# Patient Record
Sex: Female | Born: 1965 | Race: White | Hispanic: No | Marital: Married | Smoking: Never smoker
Health system: Southern US, Community
[De-identification: ages and names within clinical notes are randomized; demographics above are authoritative.]

## PROBLEM LIST (undated history)

## (undated) DIAGNOSIS — F53 Postpartum depression: Secondary | ICD-10-CM

## (undated) DIAGNOSIS — N63 Unspecified lump in unspecified breast: Secondary | ICD-10-CM

## (undated) DIAGNOSIS — Q632 Ectopic kidney: Secondary | ICD-10-CM

## (undated) DIAGNOSIS — E7800 Pure hypercholesterolemia, unspecified: Secondary | ICD-10-CM

## (undated) DIAGNOSIS — N879 Dysplasia of cervix uteri, unspecified: Secondary | ICD-10-CM

## (undated) DIAGNOSIS — I10 Essential (primary) hypertension: Secondary | ICD-10-CM

## (undated) DIAGNOSIS — O99345 Other mental disorders complicating the puerperium: Secondary | ICD-10-CM

## (undated) DIAGNOSIS — F419 Anxiety disorder, unspecified: Secondary | ICD-10-CM

## (undated) DIAGNOSIS — K219 Gastro-esophageal reflux disease without esophagitis: Secondary | ICD-10-CM

## (undated) DIAGNOSIS — I4891 Unspecified atrial fibrillation: Secondary | ICD-10-CM

## (undated) DIAGNOSIS — F319 Bipolar disorder, unspecified: Secondary | ICD-10-CM

## (undated) DIAGNOSIS — C569 Malignant neoplasm of unspecified ovary: Secondary | ICD-10-CM

## (undated) DIAGNOSIS — E119 Type 2 diabetes mellitus without complications: Secondary | ICD-10-CM

## (undated) HISTORY — DX: Other mental disorders complicating the puerperium: O99.345

## (undated) HISTORY — PX: GYNECOLOGIC CRYOSURGERY: SHX857

## (undated) HISTORY — PX: COLPOSCOPY: SHX161

## (undated) HISTORY — PX: TOTAL ABDOMINAL HYSTERECTOMY: SHX209

## (undated) HISTORY — DX: Postpartum depression: F53.0

## (undated) HISTORY — PX: ABDOMINAL HYSTERECTOMY: SHX81

## (undated) HISTORY — PX: BREAST SURGERY: SHX581

## (undated) HISTORY — DX: Bipolar disorder, unspecified: F31.9

## (undated) HISTORY — DX: Dysplasia of cervix uteri, unspecified: N87.9

## (undated) HISTORY — DX: Malignant neoplasm of unspecified ovary: C56.9

## (undated) HISTORY — DX: Ectopic kidney: Q63.2

---

## 1989-02-15 HISTORY — PX: PELVIC LAPAROSCOPY: SHX162

## 1999-11-20 ENCOUNTER — Encounter: Payer: Self-pay | Admitting: Emergency Medicine

## 1999-11-20 ENCOUNTER — Emergency Department (HOSPITAL_COMMUNITY): Admission: EM | Admit: 1999-11-20 | Discharge: 1999-11-21 | Payer: Self-pay | Admitting: Emergency Medicine

## 2000-06-01 ENCOUNTER — Encounter: Payer: Self-pay | Admitting: General Practice

## 2000-06-01 ENCOUNTER — Encounter: Admission: RE | Admit: 2000-06-01 | Discharge: 2000-06-01 | Payer: Self-pay | Admitting: General Practice

## 2000-07-21 ENCOUNTER — Encounter: Payer: Self-pay | Admitting: Gastroenterology

## 2000-07-21 ENCOUNTER — Encounter: Admission: RE | Admit: 2000-07-21 | Discharge: 2000-07-21 | Payer: Self-pay | Admitting: Gastroenterology

## 2000-08-30 ENCOUNTER — Other Ambulatory Visit: Admission: RE | Admit: 2000-08-30 | Discharge: 2000-08-30 | Payer: Self-pay | Admitting: Obstetrics and Gynecology

## 2001-04-11 ENCOUNTER — Ambulatory Visit (HOSPITAL_BASED_OUTPATIENT_CLINIC_OR_DEPARTMENT_OTHER): Admission: RE | Admit: 2001-04-11 | Discharge: 2001-04-11 | Payer: Self-pay | Admitting: Pulmonary Disease

## 2001-06-21 ENCOUNTER — Other Ambulatory Visit: Admission: RE | Admit: 2001-06-21 | Discharge: 2001-06-21 | Payer: Self-pay | Admitting: *Deleted

## 2001-10-12 ENCOUNTER — Encounter: Admission: RE | Admit: 2001-10-12 | Discharge: 2001-10-12 | Payer: Self-pay | Admitting: Gynecology

## 2002-01-02 ENCOUNTER — Inpatient Hospital Stay (HOSPITAL_COMMUNITY): Admission: AD | Admit: 2002-01-02 | Discharge: 2002-01-06 | Payer: Self-pay | Admitting: *Deleted

## 2002-01-04 ENCOUNTER — Encounter (INDEPENDENT_AMBULATORY_CARE_PROVIDER_SITE_OTHER): Payer: Self-pay

## 2002-02-20 ENCOUNTER — Other Ambulatory Visit: Admission: RE | Admit: 2002-02-20 | Discharge: 2002-02-20 | Payer: Self-pay | Admitting: *Deleted

## 2002-02-26 ENCOUNTER — Encounter: Admission: RE | Admit: 2002-02-26 | Discharge: 2002-02-26 | Payer: Self-pay | Admitting: Psychiatry

## 2003-02-25 ENCOUNTER — Other Ambulatory Visit: Admission: RE | Admit: 2003-02-25 | Discharge: 2003-02-25 | Payer: Self-pay | Admitting: Obstetrics and Gynecology

## 2004-03-06 ENCOUNTER — Other Ambulatory Visit: Admission: RE | Admit: 2004-03-06 | Discharge: 2004-03-06 | Payer: Self-pay | Admitting: Obstetrics and Gynecology

## 2004-03-16 ENCOUNTER — Encounter: Admission: RE | Admit: 2004-03-16 | Discharge: 2004-03-16 | Payer: Self-pay | Admitting: Obstetrics and Gynecology

## 2006-04-25 ENCOUNTER — Other Ambulatory Visit: Admission: RE | Admit: 2006-04-25 | Discharge: 2006-04-25 | Payer: Self-pay | Admitting: Obstetrics and Gynecology

## 2006-10-27 ENCOUNTER — Encounter: Admission: RE | Admit: 2006-10-27 | Discharge: 2006-10-27 | Payer: Self-pay | Admitting: Obstetrics and Gynecology

## 2006-11-28 ENCOUNTER — Encounter: Admission: RE | Admit: 2006-11-28 | Discharge: 2006-11-28 | Payer: Self-pay | Admitting: Family Medicine

## 2007-02-26 ENCOUNTER — Emergency Department (HOSPITAL_COMMUNITY): Admission: EM | Admit: 2007-02-26 | Discharge: 2007-02-26 | Payer: Self-pay | Admitting: Emergency Medicine

## 2007-05-18 ENCOUNTER — Other Ambulatory Visit: Admission: RE | Admit: 2007-05-18 | Discharge: 2007-05-18 | Payer: Self-pay | Admitting: Obstetrics and Gynecology

## 2007-09-06 ENCOUNTER — Encounter: Admission: RE | Admit: 2007-09-06 | Discharge: 2007-09-06 | Payer: Self-pay | Admitting: Family Medicine

## 2007-10-30 ENCOUNTER — Encounter: Admission: RE | Admit: 2007-10-30 | Discharge: 2007-10-30 | Payer: Self-pay | Admitting: Neurology

## 2007-12-25 ENCOUNTER — Encounter: Admission: RE | Admit: 2007-12-25 | Discharge: 2007-12-25 | Payer: Self-pay | Admitting: Neurology

## 2008-01-15 ENCOUNTER — Encounter: Admission: RE | Admit: 2008-01-15 | Discharge: 2008-01-15 | Payer: Self-pay | Admitting: Neurology

## 2008-07-02 ENCOUNTER — Encounter: Admission: RE | Admit: 2008-07-02 | Discharge: 2008-07-02 | Payer: Self-pay | Admitting: Obstetrics and Gynecology

## 2008-09-23 ENCOUNTER — Encounter: Payer: Self-pay | Admitting: Obstetrics and Gynecology

## 2008-09-23 ENCOUNTER — Other Ambulatory Visit: Admission: RE | Admit: 2008-09-23 | Discharge: 2008-09-23 | Payer: Self-pay | Admitting: Obstetrics and Gynecology

## 2008-09-23 ENCOUNTER — Ambulatory Visit: Payer: Self-pay | Admitting: Obstetrics and Gynecology

## 2009-11-13 ENCOUNTER — Encounter: Admission: RE | Admit: 2009-11-13 | Discharge: 2009-11-13 | Payer: Self-pay | Admitting: Orthopedic Surgery

## 2009-12-26 ENCOUNTER — Encounter: Admission: RE | Admit: 2009-12-26 | Discharge: 2009-12-26 | Payer: Self-pay | Admitting: Orthopedic Surgery

## 2010-02-15 HISTORY — PX: NECK SURGERY: SHX720

## 2010-03-08 ENCOUNTER — Encounter: Payer: Self-pay | Admitting: Obstetrics and Gynecology

## 2010-03-09 ENCOUNTER — Encounter: Payer: Self-pay | Admitting: Neurology

## 2010-05-23 ENCOUNTER — Emergency Department (HOSPITAL_COMMUNITY)
Admission: EM | Admit: 2010-05-23 | Discharge: 2010-05-24 | Disposition: A | Discharge status: Home / Self Care | Payer: Federal, State, Local not specified - PPO | Attending: Emergency Medicine | Admitting: Emergency Medicine

## 2010-05-23 DIAGNOSIS — F319 Bipolar disorder, unspecified: Secondary | ICD-10-CM | POA: Insufficient documentation

## 2010-05-23 DIAGNOSIS — H53149 Visual discomfort, unspecified: Secondary | ICD-10-CM | POA: Insufficient documentation

## 2010-05-23 DIAGNOSIS — R42 Dizziness and giddiness: Secondary | ICD-10-CM | POA: Insufficient documentation

## 2010-05-23 DIAGNOSIS — R51 Headache: Secondary | ICD-10-CM | POA: Insufficient documentation

## 2010-05-23 DIAGNOSIS — R110 Nausea: Secondary | ICD-10-CM | POA: Insufficient documentation

## 2010-05-23 DIAGNOSIS — K219 Gastro-esophageal reflux disease without esophagitis: Secondary | ICD-10-CM | POA: Insufficient documentation

## 2010-05-23 DIAGNOSIS — R209 Unspecified disturbances of skin sensation: Secondary | ICD-10-CM | POA: Insufficient documentation

## 2010-05-24 ENCOUNTER — Emergency Department (HOSPITAL_COMMUNITY): Payer: Federal, State, Local not specified - PPO

## 2010-06-24 ENCOUNTER — Other Ambulatory Visit: Payer: Self-pay | Admitting: Obstetrics and Gynecology

## 2010-06-24 DIAGNOSIS — Z1231 Encounter for screening mammogram for malignant neoplasm of breast: Secondary | ICD-10-CM

## 2010-06-28 ENCOUNTER — Emergency Department (HOSPITAL_COMMUNITY)
Admission: EM | Admit: 2010-06-28 | Discharge: 2010-06-29 | Disposition: A | Discharge status: Home / Self Care | Payer: Federal, State, Local not specified - PPO | Attending: Emergency Medicine | Admitting: Emergency Medicine

## 2010-06-28 DIAGNOSIS — H53149 Visual discomfort, unspecified: Secondary | ICD-10-CM | POA: Insufficient documentation

## 2010-06-28 DIAGNOSIS — Z79899 Other long term (current) drug therapy: Secondary | ICD-10-CM | POA: Insufficient documentation

## 2010-06-28 DIAGNOSIS — F319 Bipolar disorder, unspecified: Secondary | ICD-10-CM | POA: Insufficient documentation

## 2010-06-28 DIAGNOSIS — R51 Headache: Secondary | ICD-10-CM | POA: Insufficient documentation

## 2010-06-28 DIAGNOSIS — R110 Nausea: Secondary | ICD-10-CM | POA: Insufficient documentation

## 2010-06-30 ENCOUNTER — Ambulatory Visit
Admission: RE | Admit: 2010-06-30 | Discharge: 2010-06-30 | Disposition: A | Discharge status: Home / Self Care | Payer: Federal, State, Local not specified - PPO | Source: Ambulatory Visit | Attending: Obstetrics and Gynecology | Admitting: Obstetrics and Gynecology

## 2010-06-30 DIAGNOSIS — Z1231 Encounter for screening mammogram for malignant neoplasm of breast: Secondary | ICD-10-CM

## 2010-07-01 ENCOUNTER — Other Ambulatory Visit: Payer: Self-pay | Admitting: Obstetrics and Gynecology

## 2010-07-01 DIAGNOSIS — R928 Other abnormal and inconclusive findings on diagnostic imaging of breast: Secondary | ICD-10-CM

## 2010-07-03 NOTE — Discharge Summary (Signed)
   NAME:  Morgan [name], Morgan [name]                        ACCOUNT NO.:  [id number]   MEDICAL RECORD NO.:  [id number]                   PATIENT TYPE:  INP   LOCATION:  9130                                 FACILITY:  [facility]   PHYSICIAN:  Ivor Costa. Farrel Gobble, M.D.              DATE OF BIRTH:  Mar 07, 1965   DATE OF ADMISSION:  01/02/2002  DATE OF DISCHARGE:  01/06/2002                                 DISCHARGE SUMMARY   DISCHARGE DIAGNOSES:  1. Intrauterine pregnancy at 39 weeks.  2. Pregnancy-induced hypertension.  3. Arrest of dilatation.  4. Fetal tachycardia.  5. Occipitoposterior position.   PROCEDURE:  Primary low cervical transverse cesarean section with delivery  of a viable infant.   HISTORY OF PRESENT ILLNESS:  The patient is a 45 year old gravida 1 with an  EDC of January 12, 2002.  Prenatal course was complicated by pelvic left  kidney.   LABORATORY DATA:  Blood type A positive, antibody screen negative, RPR  _____________, HIV nonreactive. GBS was positive.   HOSPITAL COURSE OF TREATMENT:  The patient was admitted on January 03, 2002  for induction of labor secondary to [facility], secondary to arrest of dilatation,  fetal tachycardia, occipitoposterior position.  Delivery was accomplished by  primary low cervical transverse cesarean section.  Performed by Dr.  Penni Homans under epidural anesthesia.  Findings included a viable 6 pound 9  ounce female infant, Apgars 8 and 9; normal appearing uterus, tubes and  ovaries.   Postpartum, the patient remained afebrile, had no difficulty voiding, was  ready to be discharged on her third postoperative day in satisfactory  condition.  CBC:  Hematocrit 20.9, hemoglobin 7.2, WBC 11.7, platelets 161.   DISPOSITION:  Follow up in six weeks.   DISCHARGE MEDICATIONS:  Continue prenatal vitamins and iron, Motrin and  Tylox for pain.     Elwyn Lade . Hancock, N.P.                Ivor Costa. Farrel Gobble, M.D.    MKH/MEDQ  D:  02/05/2002  T:  02/05/2002  Job:   [id number]

## 2010-07-03 NOTE — Op Note (Signed)
NAME:  Morgan [name], Morgan [name]                        ACCOUNT NO.:  [id number]   MEDICAL RECORD NO.:  [id number]                   PATIENT TYPE:  INP   LOCATION:  9130                                 FACILITY:  [facility]   PHYSICIAN:  Katy Fitch, M.D.               DATE OF BIRTH:  1965/07/11   DATE OF PROCEDURE:  01/03/2002  DATE OF DISCHARGE:                                 OPERATIVE REPORT   PREOPERATIVE DIAGNOSES:  1. 39 week intrauterine pregnancy.  2. Pregnancy induced hypertension.  3. Arrested dilatation.  4. Fetal tachycardia.  5. Occiput posterior position.   POSTOPERATIVE DIAGNOSES:  1. 39 week intrauterine pregnancy.  2. Pregnancy induced hypertension.  3. Arrested dilatation.  4. Fetal tachycardia.  5. Occiput position.   PROCEDURE:  Primary low transverse cesarean section.   SURGEON:  Katy Fitch, M.D.   ANESTHESIA:  Epidural.   ESTIMATED BLOOD LOSS:  800   URINE OUTPUT:  100   FLUIDS:  2 liters.   COMPLICATIONS:  None.   SPECIMENS:  1. Cord pH.  2. Cord blood.  3. Placenta.   FINDINGS:  Living 6 pound 9 ounce female with Apgar of 8&9 with three vessel  cord in OP position. Cord pH noted to be 7.29, normal appearing uterus,  tubes and ovaries. After the patient was arrested at 8 cm and noted to be in  OP position for over two hours, the patient was offered a cesarean section  and taken back to the operating room after risks were accepted by the  husband and patient.   DESCRIPTION OF PROCEDURE:  The patient was prepped and draped in normal  sterile fashion after epidural anesthesia was bolused. The Pfannenstiel  incision was made in the skin and carried down to the underlying fascia. The  fascia was incised and extended transversely. Using curved Mayo scissors,  the inferior fascial border was grasped with Kocher clamps and the rectus  muscles were dissected off that bluntly and sharply with the curved Mayo  scissors. This was done similarly with the  superior fascial border. The  rectus muscles were separated in the midline manually. The underlying  peritoneum was then entered sharply with a hemostat. The peritoneum was  extended superiorly and then inferiorly to the level of the bladder. A  bladder blade was placed in the pelvis and vesicouterine peritoneum  identified and incised with Metzenbaums and extended laterally. A bladder  flap was then created digitally replacing a newly created bladder flap. A  low transverse uterine incision was made with a scalpel and extended  transversely with bandage scissors. The infants head was noted to be in an  occiput posterior position and the infant's head was then delivered  atraumatically and then the rest of the body. The mouth and nose were  suctioned, the cord was clamped and cut and the infant handed off to waiting  NICU attendants. Cord pH was then  obtained and then cord blood and then the  placenta was massaged from the uterus. The uterus was then exteriorized and  cleared of all clots and debris. A running 1-0 Chromic interlocking stitch  was used to close the lower uterine segment. The incision was then tacked  and the posterior cul-de-sac was irrigated with warm normal saline. There  were several areas noted bleeding in the incision and a second layer of 1-0  Chromic interlocking stitches were placed to embrocate the first uterine  incision. This was noted to be hemostatic. The pelvis was then irrigated  with warm normal saline. The rectus muscles were then reapproximated with  two interrupted 1-0 Chromic sutures with the peritoneum also included. The  subfascial area was inspected for bleeding and noted to be hemostatic. The  fascia was closed using a running #0 Vicryl stitch. The subcutaneous tissue  was irrigated with warm normal saline and two areas of Bovie cautery to  obtain hemostasis. The skin was closed using staples. The patient was taken  to the recovery room in stable  condition.                                                Katy Fitch, M.D.    DC/MEDQ  D:  01/03/2002  T:  01/03/2002  Job:  [id number]

## 2010-07-09 ENCOUNTER — Ambulatory Visit
Admission: RE | Admit: 2010-07-09 | Discharge: 2010-07-09 | Disposition: A | Discharge status: Home / Self Care | Payer: Federal, State, Local not specified - PPO | Source: Ambulatory Visit | Attending: Obstetrics and Gynecology | Admitting: Obstetrics and Gynecology

## 2010-07-09 ENCOUNTER — Other Ambulatory Visit: Payer: Federal, State, Local not specified - PPO

## 2010-07-09 DIAGNOSIS — R928 Other abnormal and inconclusive findings on diagnostic imaging of breast: Secondary | ICD-10-CM

## 2010-07-10 ENCOUNTER — Encounter (INDEPENDENT_AMBULATORY_CARE_PROVIDER_SITE_OTHER): Payer: Federal, State, Local not specified - PPO | Admitting: Obstetrics and Gynecology

## 2010-07-10 ENCOUNTER — Other Ambulatory Visit: Payer: Self-pay | Admitting: Obstetrics and Gynecology

## 2010-07-10 ENCOUNTER — Other Ambulatory Visit (HOSPITAL_COMMUNITY)
Admission: RE | Admit: 2010-07-10 | Discharge: 2010-07-10 | Disposition: A | Discharge status: Home / Self Care | Payer: Federal, State, Local not specified - PPO | Source: Ambulatory Visit | Attending: Obstetrics and Gynecology | Admitting: Obstetrics and Gynecology

## 2010-07-10 DIAGNOSIS — Z124 Encounter for screening for malignant neoplasm of cervix: Secondary | ICD-10-CM | POA: Insufficient documentation

## 2010-07-10 DIAGNOSIS — R809 Proteinuria, unspecified: Secondary | ICD-10-CM

## 2010-07-10 DIAGNOSIS — Z13220 Encounter for screening for lipoid disorders: Secondary | ICD-10-CM

## 2010-07-10 DIAGNOSIS — Z833 Family history of diabetes mellitus: Secondary | ICD-10-CM

## 2010-07-10 DIAGNOSIS — Z01419 Encounter for gynecological examination (general) (routine) without abnormal findings: Secondary | ICD-10-CM

## 2010-07-10 DIAGNOSIS — N949 Unspecified condition associated with female genital organs and menstrual cycle: Secondary | ICD-10-CM

## 2010-11-05 LAB — DIFFERENTIAL
Basophils Absolute: 0
Basophils Relative: 0
Eosinophils Absolute: 0.6
Eosinophils Relative: 10 — ABNORMAL HIGH
Lymphocytes Relative: 24
Lymphs Abs: 1.5
Monocytes Absolute: 0.2
Monocytes Relative: 4
Neutro Abs: 3.9
Neutrophils Relative %: 62

## 2010-11-05 LAB — POCT CARDIAC MARKERS
CKMB, poc: <1 — ABNORMAL LOW
Myoglobin, poc: 55
Operator id: 284141
Troponin i, poc: <0.05

## 2010-11-05 LAB — I-STAT 8, (EC8 V) (CONVERTED LAB)
BUN: 8
Bicarbonate: 24.6 — ABNORMAL HIGH
Chloride: 109
Glucose, Bld: 97
HCT: 42
Hemoglobin: 14.3
Operator id: 284141
Potassium: 4.1
Sodium: 139
TCO2: 26
pCO2, Ven: 40 — ABNORMAL LOW
pH, Ven: 7.396 — ABNORMAL HIGH

## 2010-11-05 LAB — POCT I-STAT CREATININE
Creatinine, Ser: 0.9
Operator id: 284141

## 2010-11-05 LAB — CBC
HCT: 41.2
Hemoglobin: 14.1
MCHC: 34.1
MCV: 88.1
Platelets: 253
RBC: 4.68
RDW: 13.2
WBC: 6.2

## 2010-11-05 LAB — D-DIMER, QUANTITATIVE (NOT AT ARMC): D-Dimer, Quant: 0.22

## 2011-04-02 ENCOUNTER — Encounter: Payer: Self-pay | Admitting: *Deleted

## 2011-04-02 ENCOUNTER — Telehealth: Payer: Self-pay | Admitting: *Deleted

## 2011-04-02 ENCOUNTER — Ambulatory Visit (INDEPENDENT_AMBULATORY_CARE_PROVIDER_SITE_OTHER): Payer: Federal, State, Local not specified - PPO | Admitting: Obstetrics and Gynecology

## 2011-04-02 DIAGNOSIS — N939 Abnormal uterine and vaginal bleeding, unspecified: Secondary | ICD-10-CM

## 2011-04-02 DIAGNOSIS — N938 Other specified abnormal uterine and vaginal bleeding: Secondary | ICD-10-CM

## 2011-04-02 DIAGNOSIS — F319 Bipolar disorder, unspecified: Secondary | ICD-10-CM | POA: Insufficient documentation

## 2011-04-02 DIAGNOSIS — N926 Irregular menstruation, unspecified: Secondary | ICD-10-CM

## 2011-04-02 LAB — FOLLICLE STIMULATING HORMONE: FSH: 15.5 m[IU]/mL

## 2011-04-02 LAB — TSH: TSH: 3.728 u[IU]/mL (ref 0.350–4.500)

## 2011-04-02 MED ORDER — MEGESTROL ACETATE 40 MG PO TABS: 40.0000 mg | ORAL_TABLET | Freq: Two times a day (BID) | ORAL | Status: AC

## 2011-04-02 NOTE — Progress Notes (Signed)
The patient had gone 5 months without a period. She then started to bleed last night. It is now heavy with clots and cramping. She contraceptives by vasectomy.  Pelvic exam: External within normal limits. BUS within normal limits. Vaginal exam within normal limits. Cervix is clean without lesions. Uterus is normal size and shape. Adnexa failed to reveal masses. Rectovaginal examination is confirmatory and without masses. Kennon Portela present.  Assessment: DUB  Plan: Endometrial biopsy done. TSH and FSH drawn. Patient started on Megace 40 mg twice a day for 20 days. Discussed having her call every 10 weeks that she doesn't bleed for Provera withdrawal. Ibuprofen until bleeding stops.

## 2011-04-02 NOTE — Telephone Encounter (Signed)
We were brought a note by Dr Garen Lah office this morning at 820am to call the patient. She had called the dr on call and was told we would call to make her an appointment.Dr Hyacinth Meeker advised she needed to make an appt. I called the patient. She states no period in 5 months, she has been skipping a lot lately. This month started with pain 2 days ago and then a heavy period as of today. Pt was made an appt for today

## 2011-04-05 ENCOUNTER — Ambulatory Visit: Payer: Federal, State, Local not specified - PPO | Admitting: Obstetrics and Gynecology

## 2011-04-05 ENCOUNTER — Other Ambulatory Visit: Payer: Self-pay | Admitting: Obstetrics and Gynecology

## 2011-04-05 ENCOUNTER — Other Ambulatory Visit: Payer: Federal, State, Local not specified - PPO

## 2011-04-05 ENCOUNTER — Ambulatory Visit (INDEPENDENT_AMBULATORY_CARE_PROVIDER_SITE_OTHER): Payer: Federal, State, Local not specified - PPO | Admitting: Obstetrics and Gynecology

## 2011-04-05 ENCOUNTER — Ambulatory Visit (INDEPENDENT_AMBULATORY_CARE_PROVIDER_SITE_OTHER): Payer: Federal, State, Local not specified - PPO

## 2011-04-05 ENCOUNTER — Telehealth: Payer: Self-pay | Admitting: *Deleted

## 2011-04-05 DIAGNOSIS — Q632 Ectopic kidney: Secondary | ICD-10-CM

## 2011-04-05 DIAGNOSIS — R102 Pelvic and perineal pain: Secondary | ICD-10-CM

## 2011-04-05 DIAGNOSIS — N938 Other specified abnormal uterine and vaginal bleeding: Secondary | ICD-10-CM

## 2011-04-05 DIAGNOSIS — N921 Excessive and frequent menstruation with irregular cycle: Secondary | ICD-10-CM

## 2011-04-05 DIAGNOSIS — R1032 Left lower quadrant pain: Secondary | ICD-10-CM

## 2011-04-05 DIAGNOSIS — Q638 Other specified congenital malformations of kidney: Secondary | ICD-10-CM

## 2011-04-05 DIAGNOSIS — N939 Abnormal uterine and vaginal bleeding, unspecified: Secondary | ICD-10-CM

## 2011-04-05 DIAGNOSIS — N949 Unspecified condition associated with female genital organs and menstrual cycle: Secondary | ICD-10-CM

## 2011-04-05 DIAGNOSIS — N926 Irregular menstruation, unspecified: Secondary | ICD-10-CM

## 2011-04-05 MED ORDER — HYDROCODONE-ACETAMINOPHEN 5-500 MG PO TABS: 2.0000 | ORAL_TABLET | Freq: Four times a day (QID) | ORAL | Status: DC | PRN

## 2011-04-05 NOTE — Progress Notes (Signed)
Patient came to see me today because her bleeding restarted after 2 days of Megace and addition she's having left lower quadrant pain. She is having no GI symptoms at all. Her endometrial biopsy was benign. Her lab work was normal.  Exam: Kim gardner  Present. Abdomen: Soft without peritoneal signs. Some tenderness in left lower quadrant. Pelvic ultrasound: Patient has a anteverted uterus with a prominent endometrial cavity. Her endometrial echo is 8.9 mm. There is positive PFD within the cavity. Her right ovary is normal. The left ovary appears to be normal. She does have a left pelvic kidney. Her cul-de-sac is free of fluid.  Assessment: DUB. Possible endometrial polyp.  Plan: Increase Megace 40 mg 4 times a day. Reduced to 3 times a day when no bleeding for 2 days. Continue medication for 20 days.Warned about withdrawal bleeding.  We will watch and if bleeding reoccurs consider possibility of endometrial polyp. Call every 10 weeks that she does not have a cycle of Provera withdrawal. Vicodin 5/ 500 prescription written.

## 2011-04-05 NOTE — Telephone Encounter (Signed)
I have just sent you a note on her. Please read it and call her with results. Due to the sharp pain she should schedule ultrasound here today.

## 2011-04-05 NOTE — Telephone Encounter (Signed)
Pt informed with the below note, transferred to make appointment for ultrasound, order placed, pt also informed of 2/15 result note.

## 2011-04-05 NOTE — Telephone Encounter (Signed)
Pt was seen on Friday for amenorrhea x 5 months and then sudden bleeding, pt was given megace 40 mg, bleeding has stopped. Pt is  c/o sharp pelvic pain on left side she is taking Ibuprofen 800 mg but no relief. Pt said no cramping just sharp pain. Please advise

## 2011-05-05 ENCOUNTER — Telehealth: Payer: Self-pay | Admitting: *Deleted

## 2011-05-05 NOTE — Telephone Encounter (Signed)
Pt called c/o no period yet, per Dr.G note pt is to call if no period in 10 weeks from taking megace. Pt okay with this and will follow up.

## 2011-08-12 ENCOUNTER — Telehealth: Payer: Self-pay | Admitting: *Deleted

## 2011-08-12 MED ORDER — MEDROXYPROGESTERONE ACETATE 10 MG PO TABS: 10.0000 mg | ORAL_TABLET | Freq: Every day | ORAL | Status: DC

## 2011-08-12 NOTE — Telephone Encounter (Signed)
Pt has same partner, no chance of pregnancy, Rx called in, pt will follow up as needed.

## 2011-08-12 NOTE — Addendum Note (Signed)
Addended by: Aura Camps on: 08/12/2011 12:42 PM   Modules accepted: Orders

## 2011-08-12 NOTE — Telephone Encounter (Signed)
I assume she is still using vasectomy for contraception. If partner has changed let me know. If same partner Provera 10 mg daily for 5 days. If no cycle within 14 days of finishing let me know.

## 2011-08-12 NOTE — Telephone Encounter (Signed)
Pt was told to follow up if no cycle in 10 weeks pt said last cycle was 1st week of April. Pt is calling to get rx to start cycle. Please advise

## 2011-08-31 ENCOUNTER — Encounter: Payer: Self-pay | Admitting: *Deleted

## 2011-08-31 NOTE — Progress Notes (Signed)
Patient ID: Morgan [name], female   DOB: 1965/05/13, 46 y.o.   MRN: [id number] Pt was given provera x 5 days to take to start cycle no cycle yet, pt has had some cramping, told pt to wait until Thursday no cycle call back and will send to doctor, pt okay with this as well.

## 2011-09-02 ENCOUNTER — Telehealth: Payer: Self-pay | Admitting: *Deleted

## 2011-09-02 NOTE — Telephone Encounter (Signed)
(  Pt of DR.G) pt was given provera 10 mg daily x 5 days ( 08/12/11)  to start cycle, pt took all rx had some cramping on Tuesday no cycle yet. Pt was told to call back if no cycle in 14 days after taking medication. Please advise

## 2011-09-02 NOTE — Telephone Encounter (Signed)
I would recommend office visit. Not emergency can wait to Dr. Eda Paschal returns or she can see one of [place] sooner if she chooses

## 2011-09-02 NOTE — Telephone Encounter (Signed)
Pt informed with the below note. She will wait until Dr.G returns

## 2011-10-11 ENCOUNTER — Encounter: Payer: Self-pay | Admitting: Obstetrics and Gynecology

## 2011-10-11 ENCOUNTER — Ambulatory Visit (INDEPENDENT_AMBULATORY_CARE_PROVIDER_SITE_OTHER): Payer: Federal, State, Local not specified - PPO | Admitting: Obstetrics and Gynecology

## 2011-10-11 VITALS — BP 126/84 | Ht 62.0 in | Wt 156.0 lb

## 2011-10-11 DIAGNOSIS — Z833 Family history of diabetes mellitus: Secondary | ICD-10-CM

## 2011-10-11 DIAGNOSIS — Z01419 Encounter for gynecological examination (general) (routine) without abnormal findings: Secondary | ICD-10-CM

## 2011-10-11 DIAGNOSIS — N912 Amenorrhea, unspecified: Secondary | ICD-10-CM

## 2011-10-11 DIAGNOSIS — N879 Dysplasia of cervix uteri, unspecified: Secondary | ICD-10-CM | POA: Insufficient documentation

## 2011-10-11 LAB — CBC WITH DIFFERENTIAL/PLATELET
Basophils Absolute: 0 10*3/uL (ref 0.0–0.1)
Basophils Relative: 1 % (ref 0–1)
Eosinophils Absolute: 0.4 10*3/uL (ref 0.0–0.7)
Eosinophils Relative: 5 % (ref 0–5)
HCT: 36.5 % (ref 36.0–46.0)
Hemoglobin: 12.3 g/dL (ref 12.0–15.0)
Lymphocytes Relative: 26 % (ref 12–46)
Lymphs Abs: 1.8 10*3/uL (ref 0.7–4.0)
MCH: 27.2 pg (ref 26.0–34.0)
MCHC: 33.7 g/dL (ref 30.0–36.0)
MCV: 80.6 fL (ref 78.0–100.0)
Monocytes Absolute: 0.3 10*3/uL (ref 0.1–1.0)
Monocytes Relative: 5 % (ref 3–12)
Neutro Abs: 4.3 10*3/uL (ref 1.7–7.7)
Neutrophils Relative %: 63 % (ref 43–77)
Platelets: 216 10*3/uL (ref 150–400)
RBC: 4.53 MIL/uL (ref 3.87–5.11)
RDW: 14.5 % (ref 11.5–15.5)
WBC: 6.8 10*3/uL (ref 4.0–10.5)

## 2011-10-11 LAB — LIPID PANEL
Cholesterol: 192 mg/dL (ref 0–200)
HDL: 35 mg/dL — ABNORMAL LOW (ref >39)
LDL Cholesterol: 111 mg/dL — ABNORMAL HIGH (ref 0–99)
Total CHOL/HDL Ratio: 5.5 Ratio
Triglycerides: 229 mg/dL — ABNORMAL HIGH (ref <150)
VLDL: 46 mg/dL — ABNORMAL HIGH (ref 0–40)

## 2011-10-11 NOTE — Progress Notes (Signed)
Patient came to see me today for her annual GYN exam. We have seen her in February of this year for dysfunctional uterine bleeding and assessment was negative. After she was treated with Megace she had one more cycle and now has had secondary amenorrhea. She is not sexually active. Her partner has had a vasectomy.  She is not having menopausal symptoms. In February her [facility] was normal. In June she called for  Provera withdrawal and took Provera for 5 days. She had no period after she finished but did have some cramping. She has previously been treated for cervical dysplasia with cryosurgery but that has been greater than 20 years ago. She has had normal yearly Pap smears since then. She had a normal Pap smear in 2012. She is due for yearly mammogram. Her mother had breast cancer at 68. She is not sure if she was screened for BRCA1 and BRCA2. She has a history of endometriosis with a laparoscopy in 1991. Last year her lipid profile showed a total cholesterol 205 with an LDL of 111 and an HDL of 36. She did however have elevated triglycerides of 285. The patient is not fasted but has had very little to eat today  Physical examination:Morgan [name] present. HEENT within normal limits. Neck: Thyroid not large. No masses. Supraclavicular nodes: not enlarged. Breasts: Examined in both sitting and lying  position. No skin changes and no masses. Abdomen: Soft no guarding rebound or masses or hernia. Pelvic: External: Within normal limits. BUS: Within normal limits. Vaginal:within normal limits. Good estrogen effect. No evidence of cystocele rectocele or enterocele. Cervix: clean. Uterus: Normal size and shape. Adnexa: No masses. Rectovaginal exam: Confirmatory and negative. Extremities: Within normal limits.  Assessment: #1. CIN #2. Amenorrhea #3. Mother with early onset breast cancer#4. Previous elevated triglycerides  Plan: FSH rechecked. Patient's discussed with mother BRCA1 and BRCA2 testing and get her results  and send them to me or get her mother to be tested if she has not been.The new Pap smear guidelines were discussed with the patient. No pap done. Schedule mammogram. Although patient was not fasted we checked her lipid profile today since she had very little to eat.

## 2011-10-11 NOTE — Patient Instructions (Addendum)
Check with mother and see if she was screened for BRCA1 and 2. If she was call us with the results. If she was not she should be screened at the cancer center since she had breast cancer before the age of 13.

## 2011-10-12 ENCOUNTER — Other Ambulatory Visit: Payer: Self-pay | Admitting: Obstetrics and Gynecology

## 2011-10-12 DIAGNOSIS — Z139 Encounter for screening, unspecified: Secondary | ICD-10-CM

## 2011-10-12 LAB — HEMOGLOBIN A1C
Hgb A1c MFr Bld: 5.2 % (ref <5.7)
Mean Plasma Glucose: 103 mg/dL (ref <117)

## 2011-10-12 LAB — URINALYSIS W MICROSCOPIC + REFLEX CULTURE
Bacteria, UA: NONE SEEN
Bilirubin Urine: NEGATIVE
Casts: NONE SEEN
Glucose, UA: NEGATIVE mg/dL
Hgb urine dipstick: NEGATIVE
Ketones, ur: NEGATIVE mg/dL
Leukocytes, UA: NEGATIVE
Nitrite: NEGATIVE
Protein, ur: NEGATIVE mg/dL
Specific Gravity, Urine: 1.019 (ref 1.005–1.030)
Urobilinogen, UA: 0.2 mg/dL (ref 0.0–1.0)
pH: 5.5 (ref 5.0–8.0)

## 2011-10-12 LAB — FOLLICLE STIMULATING HORMONE: FSH: 86.2 m[IU]/mL

## 2011-12-02 ENCOUNTER — Encounter (HOSPITAL_COMMUNITY): Payer: Self-pay | Admitting: Family Medicine

## 2011-12-02 ENCOUNTER — Emergency Department (HOSPITAL_COMMUNITY): Payer: Federal, State, Local not specified - PPO

## 2011-12-02 ENCOUNTER — Emergency Department (HOSPITAL_COMMUNITY)
Admission: EM | Admit: 2011-12-02 | Discharge: 2011-12-03 | Disposition: A | Discharge status: Home / Self Care | Payer: Federal, State, Local not specified - PPO | Attending: Emergency Medicine | Admitting: Emergency Medicine

## 2011-12-02 DIAGNOSIS — R109 Unspecified abdominal pain: Secondary | ICD-10-CM

## 2011-12-02 DIAGNOSIS — A599 Trichomoniasis, unspecified: Secondary | ICD-10-CM | POA: Insufficient documentation

## 2011-12-02 DIAGNOSIS — Z79899 Other long term (current) drug therapy: Secondary | ICD-10-CM | POA: Insufficient documentation

## 2011-12-02 DIAGNOSIS — F319 Bipolar disorder, unspecified: Secondary | ICD-10-CM | POA: Insufficient documentation

## 2011-12-02 LAB — URINALYSIS, ROUTINE W REFLEX MICROSCOPIC
Bilirubin Urine: NEGATIVE
Glucose, UA: NEGATIVE mg/dL
Hgb urine dipstick: NEGATIVE
Ketones, ur: NEGATIVE mg/dL
Leukocytes, UA: NEGATIVE
Nitrite: NEGATIVE
Protein, ur: NEGATIVE mg/dL
Specific Gravity, Urine: 1.009 (ref 1.005–1.030)
Urobilinogen, UA: 0.2 mg/dL (ref 0.0–1.0)
pH: 5.5 (ref 5.0–8.0)

## 2011-12-02 LAB — CBC WITH DIFFERENTIAL/PLATELET
Basophils Absolute: 0.1 10*3/uL (ref 0.0–0.1)
Basophils Relative: 1 % (ref 0–1)
Eosinophils Absolute: 0.4 10*3/uL (ref 0.0–0.7)
Eosinophils Relative: 5 % (ref 0–5)
HCT: 38.3 % (ref 36.0–46.0)
Hemoglobin: 12.7 g/dL (ref 12.0–15.0)
Lymphocytes Relative: 31 % (ref 12–46)
Lymphs Abs: 2.3 10*3/uL (ref 0.7–4.0)
MCH: 27.3 pg (ref 26.0–34.0)
MCHC: 33.2 g/dL (ref 30.0–36.0)
MCV: 82.4 fL (ref 78.0–100.0)
Monocytes Absolute: 0.4 10*3/uL (ref 0.1–1.0)
Monocytes Relative: 5 % (ref 3–12)
Neutro Abs: 4.3 10*3/uL (ref 1.7–7.7)
Neutrophils Relative %: 58 % (ref 43–77)
Platelets: 202 10*3/uL (ref 150–400)
RBC: 4.65 MIL/uL (ref 3.87–5.11)
RDW: 12.8 % (ref 11.5–15.5)
WBC: 7.4 10*3/uL (ref 4.0–10.5)

## 2011-12-02 LAB — COMPREHENSIVE METABOLIC PANEL
ALT: 28 U/L (ref 0–35)
AST: 22 U/L (ref 0–37)
Albumin: 3.6 g/dL (ref 3.5–5.2)
Alkaline Phosphatase: 92 U/L (ref 39–117)
BUN: 10 mg/dL (ref 6–23)
CO2: 28 mEq/L (ref 19–32)
Calcium: 9.7 mg/dL (ref 8.4–10.5)
Chloride: 105 mEq/L (ref 96–112)
Creatinine, Ser: 0.76 mg/dL (ref 0.50–1.10)
GFR calc Af Amer: >90 mL/min (ref >90)
GFR calc non Af Amer: >90 mL/min (ref >90)
Glucose, Bld: 95 mg/dL (ref 70–99)
Potassium: 4.2 mEq/L (ref 3.5–5.1)
Sodium: 140 mEq/L (ref 135–145)
Total Bilirubin: 0.2 mg/dL — ABNORMAL LOW (ref 0.3–1.2)
Total Protein: 7.4 g/dL (ref 6.0–8.3)

## 2011-12-02 MED ORDER — IOHEXOL 300 MG/ML  SOLN
20.0000 mL | INTRAMUSCULAR | Status: AC
  Administered 2011-12-02: 20 mL via ORAL

## 2011-12-02 MED ORDER — ONDANSETRON HCL 4 MG/2ML IJ SOLN
4.0000 mg | Freq: Once | INTRAMUSCULAR | Status: AC
  Administered 2011-12-03: 4 mg via INTRAVENOUS
  Filled 2011-12-02: qty 2

## 2011-12-02 MED ORDER — MORPHINE SULFATE 4 MG/ML IJ SOLN
4.0000 mg | Freq: Once | INTRAMUSCULAR | Status: AC
  Administered 2011-12-03: 4 mg via INTRAVENOUS
  Filled 2011-12-02: qty 1

## 2011-12-02 NOTE — ED Notes (Signed)
Completed PO contrast.  Awaiting CT Abd

## 2011-12-02 NOTE — ED Notes (Signed)
Per pt sts lower abdominal pain. Denies urinary symptoms. sts some vomiting. sts LBM Tuesday

## 2011-12-03 ENCOUNTER — Emergency Department (HOSPITAL_COMMUNITY): Payer: Federal, State, Local not specified - PPO

## 2011-12-03 LAB — WET PREP, GENITAL
Trich, Wet Prep: NONE SEEN
Yeast Wet Prep HPF POC: NONE SEEN

## 2011-12-03 LAB — GC/CHLAMYDIA PROBE AMP, GENITAL
Chlamydia, DNA Probe: NEGATIVE
GC Probe Amp, Genital: NEGATIVE

## 2011-12-03 MED ORDER — METRONIDAZOLE 500 MG PO TABS: 500.0000 mg | ORAL_TABLET | Freq: Two times a day (BID) | ORAL | Status: DC

## 2011-12-03 MED ORDER — HYDROCODONE-ACETAMINOPHEN 5-325 MG PO TABS: 1.0000 | ORAL_TABLET | ORAL | Status: DC | PRN

## 2011-12-03 MED ORDER — IOHEXOL 300 MG/ML  SOLN
100.0000 mL | Freq: Once | INTRAMUSCULAR | Status: AC | PRN
  Administered 2011-12-03: 100 mL via INTRAVENOUS

## 2011-12-03 NOTE — ED Provider Notes (Signed)
Medical screening examination/treatment/procedure(s) were conducted as a shared visit with non-physician practitioner(s) and myself.  I personally evaluated the patient during the encounter   Myles Tavella, MD 12/03/11 0806 

## 2011-12-03 NOTE — ED Provider Notes (Signed)
History     CSN: [id number]  Arrival date & time 12/02/11  1751   First MD Initiated Contact with Patient 12/02/11 2117      Chief Complaint  Patient presents with  . Abdominal Pain    (Consider location/radiation/quality/duration/timing/severity/associated sxs/prior treatment) Patient is a 46 y.o. female presenting with abdominal pain. The history is provided by the patient.  Abdominal Pain The primary symptoms of the illness include abdominal pain, nausea and vomiting. The primary symptoms of the illness do not include fever, dysuria or vaginal discharge. The current episode started more than 2 days ago. The onset of the illness was gradual. The problem has not changed since onset. Additional symptoms associated with the illness include constipation. Symptoms associated with the illness do not include chills or back pain. Associated symptoms comments: Lower abdominal pain for 5 days without fever. She has had nausea with limited amounts of vomiting. No bowel movement in 3 days. She continues to pass gas. She denies urinary symptoms, vaginal discharge, menstrual irregularities. She was seen by her doctor yesterday and reports she was concerned for diverticulitis and started her on an antibiotic. She came tonight for persistent pain..    Past Medical History  Diagnosis Date  . Endometriosis   . Postpartum depression     hx of  . Migraine   . Bipolar disorder   . Pelvic kidney     left  . Cervical dysplasia     Past Surgical History  Procedure Date  . Pelvic laparoscopy 91  . Cesarean section 03  . Neck surgery 2012  . Gynecologic cryosurgery   . Colposcopy     Family History  Problem Relation Age of Onset  . Diabetes Mother   . Breast cancer Mother 55  . Thyroid disease Mother   . Depression Mother   . Hypertension Father   . Heart disease Father   . Cancer Father     Throat cancer  . Diabetes Maternal Grandmother   . Cancer Maternal Grandmother     uterine  .  Depression Maternal Grandmother     History  Substance Use Topics  . Smoking status: Never Smoker   . Smokeless tobacco: Never Used  . Alcohol Use: No    OB History    Grav Para Term Preterm Abortions TAB SAB Ect Mult Living   1 1 1       1       Review of Systems  Constitutional: Negative for fever and chills.  Respiratory: Negative.   Cardiovascular: Negative.   Gastrointestinal: Positive for nausea, vomiting, abdominal pain and constipation.  Genitourinary: Negative.  Negative for dysuria, vaginal discharge and vaginal pain.  Musculoskeletal: Negative.  Negative for back pain.  Skin: Negative.   Neurological: Negative.     Allergies  Review of patient's allergies indicates no known allergies.  Home Medications   Current Outpatient Rx  Name Route Sig Dispense Refill  . FLUOXETINE HCL 10 MG PO CAPS Oral Take 30 mg by mouth at bedtime.     Marland Kitchen LITHIUM CARBONATE 150 MG PO CAPS Oral Take 150 mg by mouth 3 (three) times daily with meals.    Marland Kitchen MEDROXYPROGESTERONE ACETATE 10 MG PO TABS Oral Take 10 mg by mouth at bedtime.    Marland Kitchen MOXIFLOXACIN HCL 400 MG PO TABS Oral Take 400 mg by mouth daily. For 10 days; Start date 11/30/11    . OMEPRAZOLE 20 MG PO CPDR Oral Take 20 mg by mouth at bedtime.     Marland Kitchen  TRAZODONE HCL 100 MG PO TABS Oral Take 200 mg by mouth at bedtime.     Marland Kitchen ZOLPIDEM TARTRATE 10 MG PO TABS Oral Take 20 mg by mouth at bedtime as needed. For insomnia      BP 111/69  Pulse 60  Temp 97.9 F (36.6 C) (Oral)  Resp 18  SpO2 98%  Physical Exam  Constitutional: She is oriented to person, place, and time. She appears well-developed and well-nourished.  HENT:  Head: Normocephalic.  Neck: Normal range of motion. Neck supple.  Cardiovascular: Normal rate and regular rhythm.   Pulmonary/Chest: Effort normal and breath sounds normal.  Abdominal: Soft. Bowel sounds are normal. There is tenderness. There is no rebound and no guarding.       Diffusely tender abdomen that is  soft. BS active. No mass, no gross distention.   Genitourinary: No vaginal discharge found.       Mild pelvic pain throughout pelvis. No mass. Cervix appears unremarkable.   Musculoskeletal: Normal range of motion.  Neurological: She is alert and oriented to person, place, and time.  Skin: Skin is warm and dry. No rash noted.  Psychiatric: She has a normal mood and affect.    ED Course  Procedures (including critical care time)  Labs Reviewed  COMPREHENSIVE METABOLIC PANEL - Abnormal; Notable for the following:    Total Bilirubin 0.2 (*)     All other components within normal limits  URINALYSIS, ROUTINE W REFLEX MICROSCOPIC  CBC WITH DIFFERENTIAL   Ct Abdomen Pelvis W Contrast  12/03/2011  *RADIOLOGY REPORT*  Clinical Data: Lower abdominal pain.  Bilateral pelvic pain. Nausea and vomiting for 1 week.  CT ABDOMEN AND PELVIS WITH CONTRAST  Technique:  Multidetector CT imaging of the abdomen and pelvis was performed following the standard protocol during bolus administration of intravenous contrast.  Contrast: OMNIPAQUE IOHEXOL 300 MG/ML  SOLN  Comparison: Prior comparison studies are not available.  Findings: Mild focal infiltration in the inferior right middle lung.  This could represent atelectasis or pneumonia.  Diffuse low attenuation change in the liver consistent with fatty infiltration.  Focal hyper enhancing lesion in the posterior segment right lobe of liver measuring 13 mm diameter.  A second focal hyperenhancement area in the inferior aspect of the medial segment left lobe of liver measuring 1.4 cm diameter.  These changes could represent focal nodular hyperplasia, atypical hemangiomas, focal vascular shunts, or less likely hypervascular metastases.  Consider 34-month follow-up study versus MRI for further evaluation.  The gallbladder is unremarkable.  Pancreas, spleen, adrenal glands, right kidney, abdominal aorta, and retroperitoneal lymph nodes are unremarkable.  Pelvic left  kidney is otherwise unremarkable.  The stomach, small bowel, and colon are not abnormally distended.  No discrete wall thickening.  No free air or free fluid in the abdomen.  Pelvis:  The uterus and ovaries are not enlarged.  Bladder wall is not thickened.  No free or loculated pelvic fluid collections.  No evidence of diverticulitis.  No significant pelvic lymphadenopathy. The appendix is not identified.  Normal alignment of the lumbar vertebrae.  IMPRESSION: Diffuse fatty infiltration of the liver with focal areas of hyperenhancement of indeterminate etiology but likely benign. Pelvic left kidney.  No acute process demonstrated in the abdomen or pelvis.   Original Report Authenticated By: Marlon Pel, M.D.      No diagnosis found. 1. Abdominal pain    MDM  Re-evaluation:  Patient appears more comfortable after morphine IV. CT scan negative for any  acute process. Pelvic exam done - no acute findings. Feel patient is stable for discharge.         Rodena Medin, PA-C 12/03/11 0247  Rodena Medin, PA-C 12/03/11 [phone]

## 2011-12-14 ENCOUNTER — Other Ambulatory Visit: Payer: Self-pay

## 2011-12-14 DIAGNOSIS — R1030 Lower abdominal pain, unspecified: Secondary | ICD-10-CM

## 2011-12-14 DIAGNOSIS — R102 Pelvic and perineal pain: Secondary | ICD-10-CM

## 2011-12-16 ENCOUNTER — Ambulatory Visit
Admission: RE | Admit: 2011-12-16 | Discharge: 2011-12-16 | Disposition: A | Discharge status: Home / Self Care | Payer: Federal, State, Local not specified - PPO | Source: Ambulatory Visit

## 2011-12-16 DIAGNOSIS — R102 Pelvic and perineal pain: Secondary | ICD-10-CM

## 2011-12-16 DIAGNOSIS — R1030 Lower abdominal pain, unspecified: Secondary | ICD-10-CM

## 2011-12-23 ENCOUNTER — Encounter: Payer: Self-pay | Admitting: Women's Health

## 2011-12-23 ENCOUNTER — Ambulatory Visit (INDEPENDENT_AMBULATORY_CARE_PROVIDER_SITE_OTHER): Payer: Federal, State, Local not specified - PPO | Admitting: Women's Health

## 2011-12-23 DIAGNOSIS — N898 Other specified noninflammatory disorders of vagina: Secondary | ICD-10-CM

## 2011-12-23 DIAGNOSIS — N949 Unspecified condition associated with female genital organs and menstrual cycle: Secondary | ICD-10-CM

## 2011-12-23 DIAGNOSIS — R102 Pelvic and perineal pain: Secondary | ICD-10-CM

## 2011-12-23 LAB — WET PREP FOR TRICH, YEAST, CLUE
Clue Cells Wet Prep HPF POC: NONE SEEN
Trich, Wet Prep: NONE SEEN
Yeast Wet Prep HPF POC: NONE SEEN

## 2011-12-23 MED ORDER — IBUPROFEN 600 MG PO TABS: 600.0000 mg | ORAL_TABLET | Freq: Three times a day (TID) | ORAL | Status: DC | PRN

## 2011-12-23 NOTE — Progress Notes (Signed)
Patient ID: Morgan [name], female   DOB: 1965-10-25, 46 y.o.   MRN: [id number] Presents with complaint of left side pelvic pain, reports pain today a 3, 2 days ago a 5, last week a 10. Had a CT scan on 10/31 that showed a 2 cm right ovarian cysts, questionable adenomyosis. History of endometriosis, amenorrheic with elevated FSH 86 09/2011. Denies a fever, has had some nausea and vomited x1 last week. Denies UTI symptoms or discharge.  Exam: No CVAT, abdomen soft,no rebound, radiation of pain with palpation. External genitalia within normal limits, speculum exam cervix is pink, healthy without lesion, moderate amount of white discharge with minimal erythema, wet prep negative. Bimanual no CMT, no adnexal tenderness on right, increased tenderness on left side. Rectal exam negative.  Pelvic pain resolving History of endometriosis  Plan: Motrin 600 every 8 hours as needed for pain. Schedule ultrasound in December to check for resolution of cyst. Instructed to call if pain increases or changes.

## 2012-02-03 ENCOUNTER — Ambulatory Visit: Payer: Federal, State, Local not specified - PPO | Admitting: Women's Health

## 2012-02-03 ENCOUNTER — Other Ambulatory Visit: Payer: Federal, State, Local not specified - PPO

## 2012-02-16 HISTORY — PX: BACK SURGERY: SHX140

## 2012-06-06 ENCOUNTER — Other Ambulatory Visit: Payer: Self-pay

## 2012-06-06 DIAGNOSIS — R9389 Abnormal findings on diagnostic imaging of other specified body structures: Secondary | ICD-10-CM

## 2012-06-13 ENCOUNTER — Ambulatory Visit
Admission: RE | Admit: 2012-06-13 | Discharge: 2012-06-13 | Disposition: A | Discharge status: Home / Self Care | Payer: Federal, State, Local not specified - PPO | Source: Ambulatory Visit

## 2012-06-13 DIAGNOSIS — R9389 Abnormal findings on diagnostic imaging of other specified body structures: Secondary | ICD-10-CM

## 2012-06-13 MED ORDER — GADOXETATE DISODIUM 0.25 MMOL/ML IV SOLN
7.0000 mL | Freq: Once | INTRAVENOUS | Status: AC | PRN
  Administered 2012-06-13: 7 mL via INTRAVENOUS

## 2012-08-22 ENCOUNTER — Other Ambulatory Visit: Payer: Self-pay | Admitting: Orthopedic Surgery

## 2012-08-22 DIAGNOSIS — M549 Dorsalgia, unspecified: Secondary | ICD-10-CM

## 2012-08-30 ENCOUNTER — Ambulatory Visit
Admission: RE | Admit: 2012-08-30 | Discharge: 2012-08-30 | Disposition: A | Discharge status: Home / Self Care | Payer: Federal, State, Local not specified - PPO | Source: Ambulatory Visit | Attending: Orthopedic Surgery | Admitting: Orthopedic Surgery

## 2012-08-30 DIAGNOSIS — M549 Dorsalgia, unspecified: Secondary | ICD-10-CM

## 2012-09-20 DIAGNOSIS — M5126 Other intervertebral disc displacement, lumbar region: Secondary | ICD-10-CM | POA: Insufficient documentation

## 2012-11-16 ENCOUNTER — Other Ambulatory Visit: Payer: Self-pay | Admitting: Neurosurgery

## 2012-12-01 ENCOUNTER — Encounter (HOSPITAL_COMMUNITY)
Admission: RE | Admit: 2012-12-01 | Discharge: 2012-12-01 | Disposition: A | Discharge status: Home / Self Care | Payer: Medicare Other | Source: Ambulatory Visit | Attending: Neurological Surgery | Admitting: Neurological Surgery

## 2012-12-01 ENCOUNTER — Encounter (HOSPITAL_COMMUNITY): Payer: Self-pay

## 2012-12-01 DIAGNOSIS — Z01812 Encounter for preprocedural laboratory examination: Secondary | ICD-10-CM | POA: Insufficient documentation

## 2012-12-01 HISTORY — DX: Gastro-esophageal reflux disease without esophagitis: K21.9

## 2012-12-01 LAB — APTT: aPTT: 27 seconds (ref 24–37)

## 2012-12-01 LAB — CBC WITH DIFFERENTIAL/PLATELET
Basophils Absolute: 0.1 10*3/uL (ref 0.0–0.1)
Basophils Relative: 1 % (ref 0–1)
Eosinophils Absolute: 0.6 10*3/uL (ref 0.0–0.7)
Eosinophils Relative: 10 % — ABNORMAL HIGH (ref 0–5)
HCT: 34.5 % — ABNORMAL LOW (ref 36.0–46.0)
Hemoglobin: 11.8 g/dL — ABNORMAL LOW (ref 12.0–15.0)
Lymphocytes Relative: 26 % (ref 12–46)
Lymphs Abs: 1.7 10*3/uL (ref 0.7–4.0)
MCH: 28 pg (ref 26.0–34.0)
MCHC: 34.2 g/dL (ref 30.0–36.0)
MCV: 81.8 fL (ref 78.0–100.0)
Monocytes Absolute: 0.3 10*3/uL (ref 0.1–1.0)
Monocytes Relative: 5 % (ref 3–12)
Neutro Abs: 3.6 10*3/uL (ref 1.7–7.7)
Neutrophils Relative %: 58 % (ref 43–77)
Platelets: 196 10*3/uL (ref 150–400)
RBC: 4.22 MIL/uL (ref 3.87–5.11)
RDW: 13.8 % (ref 11.5–15.5)
WBC: 6.3 10*3/uL (ref 4.0–10.5)

## 2012-12-01 LAB — BASIC METABOLIC PANEL
BUN: 11 mg/dL (ref 6–23)
CO2: 29 mEq/L (ref 19–32)
Calcium: 10.1 mg/dL (ref 8.4–10.5)
Chloride: 102 mEq/L (ref 96–112)
Creatinine, Ser: 0.82 mg/dL (ref 0.50–1.10)
GFR calc Af Amer: >90 mL/min (ref >90)
GFR calc non Af Amer: 85 mL/min — ABNORMAL LOW (ref >90)
Glucose, Bld: 95 mg/dL (ref 70–99)
Potassium: 3.3 mEq/L — ABNORMAL LOW (ref 3.5–5.1)
Sodium: 140 mEq/L (ref 135–145)

## 2012-12-01 LAB — URINALYSIS, ROUTINE W REFLEX MICROSCOPIC
Bilirubin Urine: NEGATIVE
Glucose, UA: NEGATIVE mg/dL
Hgb urine dipstick: NEGATIVE
Ketones, ur: NEGATIVE mg/dL
Nitrite: NEGATIVE
Protein, ur: NEGATIVE mg/dL
Specific Gravity, Urine: 1.02 (ref 1.005–1.030)
Urobilinogen, UA: 0.2 mg/dL (ref 0.0–1.0)
pH: 6 (ref 5.0–8.0)

## 2012-12-01 LAB — PROTIME-INR
INR: 0.95 (ref 0.00–1.49)
Prothrombin Time: 12.5 seconds (ref 11.6–15.2)

## 2012-12-01 LAB — TYPE AND SCREEN
ABO/RH(D): A POS
Antibody Screen: NEGATIVE

## 2012-12-01 LAB — HCG, SERUM, QUALITATIVE: Preg, Serum: POSITIVE — AB

## 2012-12-01 LAB — URINE MICROSCOPIC-ADD ON

## 2012-12-01 LAB — SURGICAL PCR SCREEN
MRSA, PCR: NEGATIVE
Staphylococcus aureus: NEGATIVE

## 2012-12-01 LAB — ABO/RH: ABO/RH(D): A POS

## 2012-12-01 NOTE — Pre-Procedure Instructions (Signed)
Morgan [name]  12/01/2012   Your procedure is scheduled on: Monday, October 27th   Report to [facility]  2 * 3 at  5:30 AM.             (FREE Valet Parking)  Call this number if you have problems the morning of surgery: [phone]   Remember:   Do not eat food or drink liquids after midnight Sunday.   Take these medicines the morning of surgery with A SIP OF WATER: Prozac, Hydrocodone, Omeprazole   Do not wear jewelry, make-up or nail polish.  Do not wear lotions, powders, or perfumes. You may wear deodorant.  Do not shave underarms & legs 48 hours prior to surgery.    Do not bring valuables to the hospital.  [facility] is not responsible for any belongings or valuables.               Contacts, dentures or bridgework may not be worn into surgery.  Leave suitcase in the car. After surgery it may be brought to your room.   For patients admitted to the hospital, discharge time is determined by your treatment team.              Name and phone number of your driver:    Special Instructions: Shower using CHG 2 nights before surgery and the night before surgery.  If you shower the day of surgery use CHG.  Use special wash - you have one bottle of CHG for all showers.  You should use approximately 1/3 of the bottle for each shower.   Please read over the following fact sheets that you were given: Pain Booklet, Coughing and Deep Breathing, Blood Transfusion Information, MRSA Information and Surgical Site Infection Prevention

## 2012-12-04 NOTE — Progress Notes (Signed)
Nurse called Shanda Bumps at Dr. Doreen Beam office and informed her of patients positive serum pregnancy test. Shanda Bumps stated she would let Dr. Phoebe Perch know.

## 2012-12-05 ENCOUNTER — Ambulatory Visit: Payer: Self-pay | Admitting: Women's Health

## 2012-12-06 NOTE — Progress Notes (Signed)
Anesthesia follow-up:  Patient is a 47 year old female scheduled for L4-5 PLIF on 12/11/12 by Dr. Danielle Dess.  Last week she had a positive serum pregnancy.  This was called to [facility] at Dr. Yetta Barre' office.  She contacted the patient who was adamant that she could not be pregnant.  Dr. Yetta Barre had her follow-up with her PCP at [facility].  A serum HCG, qualitative test was repeated on 12/05/12 and was negative.  Results received and placed on patient's chart under lab tab.  I updated anesthesiologist Dr. Jean Rosenthal.    Velna Ochs [facility] Short Stay Center/Anesthesiology Phone [phone] 12/06/2012 4:17 PM

## 2012-12-10 MED ORDER — CEFAZOLIN SODIUM-DEXTROSE 2-3 GM-% IV SOLR
2.0000 g | INTRAVENOUS | Status: AC
  Administered 2012-12-11: 2 g via INTRAVENOUS
  Filled 2012-12-10 (×2): qty 50

## 2012-12-10 NOTE — Anesthesia Preprocedure Evaluation (Addendum)
Anesthesia Evaluation  Patient identified by MRN, date of birth, ID band Patient awake    Reviewed: Allergy & Precautions, H&P , NPO status , Patient's Chart, lab work & pertinent test results  Airway Mallampati: II TM Distance: >3 FB     Dental   Pulmonary  breath sounds clear to auscultation        Cardiovascular Rhythm:Regular Rate:Normal     Neuro/Psych  Headaches, Bipolar Disorder    GI/Hepatic GERD-  ,  Endo/Other    Renal/GU      Musculoskeletal   Abdominal (+) + obese,   Peds  Hematology   Anesthesia Other Findings Positive preg test 10/17 repeated 10/21 NEGATIVE  Reproductive/Obstetrics                          Anesthesia Physical Anesthesia Plan  ASA: II  Anesthesia Plan: General   Post-op Pain Management:    Induction: Intravenous  Airway Management Planned: Oral ETT  Additional Equipment:   Intra-op Plan:   Post-operative Plan: Extubation in OR  Informed Consent: I have reviewed the patients History and Physical, chart, labs and discussed the procedure including the risks, benefits and alternatives for the proposed anesthesia with the patient or authorized representative who has indicated his/her understanding and acceptance.     Plan Discussed with: CRNA and Surgeon  Anesthesia Plan Comments:         Anesthesia Quick Evaluation

## 2012-12-11 ENCOUNTER — Inpatient Hospital Stay (HOSPITAL_COMMUNITY): Payer: Medicare Other

## 2012-12-11 ENCOUNTER — Encounter (HOSPITAL_COMMUNITY): Payer: Medicare Other | Admitting: Vascular Surgery

## 2012-12-11 ENCOUNTER — Inpatient Hospital Stay (HOSPITAL_COMMUNITY): Payer: Medicare Other | Admitting: Anesthesiology

## 2012-12-11 ENCOUNTER — Encounter (HOSPITAL_COMMUNITY): Payer: Self-pay | Admitting: *Deleted

## 2012-12-11 ENCOUNTER — Inpatient Hospital Stay (HOSPITAL_COMMUNITY)
Admission: RE | Admit: 2012-12-11 | Discharge: 2012-12-12 | DRG: 460 | Disposition: A | Discharge status: Home / Self Care | Payer: Medicare Other | Source: Ambulatory Visit | Attending: Neurological Surgery | Admitting: Neurological Surgery

## 2012-12-11 ENCOUNTER — Encounter (HOSPITAL_COMMUNITY)
Admission: RE | Disposition: A | Discharge status: Home / Self Care | Payer: Medicare Other | Source: Ambulatory Visit | Attending: Neurological Surgery

## 2012-12-11 DIAGNOSIS — M4716 Other spondylosis with myelopathy, lumbar region: Secondary | ICD-10-CM

## 2012-12-11 DIAGNOSIS — IMO0002 Reserved for concepts with insufficient information to code with codable children: Principal | ICD-10-CM | POA: Diagnosis present

## 2012-12-11 DIAGNOSIS — E669 Obesity, unspecified: Secondary | ICD-10-CM | POA: Diagnosis present

## 2012-12-11 DIAGNOSIS — Z79899 Other long term (current) drug therapy: Secondary | ICD-10-CM

## 2012-12-11 DIAGNOSIS — K219 Gastro-esophageal reflux disease without esophagitis: Secondary | ICD-10-CM | POA: Diagnosis present

## 2012-12-11 DIAGNOSIS — F319 Bipolar disorder, unspecified: Secondary | ICD-10-CM | POA: Diagnosis present

## 2012-12-11 DIAGNOSIS — M47817 Spondylosis without myelopathy or radiculopathy, lumbosacral region: Secondary | ICD-10-CM | POA: Diagnosis present

## 2012-12-11 SURGERY — POSTERIOR LUMBAR FUSION 1 LEVEL
Anesthesia: General | Site: Back | Wound class: Clean

## 2012-12-11 MED ORDER — HYDROMORPHONE HCL PF 1 MG/ML IJ SOLN
INTRAMUSCULAR | Status: AC
  Filled 2012-12-11: qty 1

## 2012-12-11 MED ORDER — THROMBIN 20000 UNITS EX SOLR
CUTANEOUS | Status: DC | PRN
  Administered 2012-12-11: 16:00:00 via TOPICAL

## 2012-12-11 MED ORDER — ARTIFICIAL TEARS OP OINT
TOPICAL_OINTMENT | OPHTHALMIC | Status: DC | PRN
  Administered 2012-12-11: 1 via OPHTHALMIC

## 2012-12-11 MED ORDER — LIDOCAINE-EPINEPHRINE 1 %-1:100000 IJ SOLN
INTRAMUSCULAR | Status: DC | PRN
  Administered 2012-12-11: 3 mL

## 2012-12-11 MED ORDER — PROPOFOL 10 MG/ML IV BOLUS
INTRAVENOUS | Status: DC | PRN
  Administered 2012-12-11: 180 mg via INTRAVENOUS

## 2012-12-11 MED ORDER — OXYCODONE-ACETAMINOPHEN 5-325 MG PO TABS
1.0000 | ORAL_TABLET | ORAL | Status: DC | PRN
  Administered 2012-12-12 (×3): 2 via ORAL
  Filled 2012-12-11 (×3): qty 2

## 2012-12-11 MED ORDER — MENTHOL 3 MG MT LOZG: 1.0000 | LOZENGE | OROMUCOSAL | Status: DC | PRN

## 2012-12-11 MED ORDER — METHOCARBAMOL 500 MG PO TABS: 500.0000 mg | ORAL_TABLET | Freq: Four times a day (QID) | ORAL | Status: DC | PRN

## 2012-12-11 MED ORDER — ONDANSETRON HCL 4 MG/2ML IJ SOLN
INTRAMUSCULAR | Status: DC | PRN
  Administered 2012-12-11: 4 mg via INTRAVENOUS

## 2012-12-11 MED ORDER — DEXAMETHASONE SODIUM PHOSPHATE 10 MG/ML IJ SOLN
INTRAMUSCULAR | Status: DC | PRN
  Administered 2012-12-11: 10 mg via INTRAVENOUS

## 2012-12-11 MED ORDER — SODIUM CHLORIDE 0.9 % IJ SOLN
3.0000 mL | Freq: Two times a day (BID) | INTRAMUSCULAR | Status: DC
  Administered 2012-12-11 – 2012-12-12 (×2): 3 mL via INTRAVENOUS

## 2012-12-11 MED ORDER — ACETAMINOPHEN 325 MG PO TABS: 650.0000 mg | ORAL_TABLET | ORAL | Status: DC | PRN

## 2012-12-11 MED ORDER — OXYCODONE HCL 5 MG/5ML PO SOLN: 5.0000 mg | Freq: Once | ORAL | Status: AC | PRN

## 2012-12-11 MED ORDER — OXYCODONE HCL 5 MG PO TABS
5.0000 mg | ORAL_TABLET | Freq: Once | ORAL | Status: AC | PRN
  Administered 2012-12-11: 5 mg via ORAL

## 2012-12-11 MED ORDER — 0.9 % SODIUM CHLORIDE (POUR BTL) OPTIME
TOPICAL | Status: DC | PRN
  Administered 2012-12-11: 1000 mL

## 2012-12-11 MED ORDER — FLUOXETINE HCL 20 MG PO CAPS
40.0000 mg | ORAL_CAPSULE | Freq: Every day | ORAL | Status: DC
  Administered 2012-12-11: 40 mg via ORAL
  Filled 2012-12-11 (×2): qty 2

## 2012-12-11 MED ORDER — ALUM & MAG HYDROXIDE-SIMETH 200-200-20 MG/5ML PO SUSP: 30.0000 mL | Freq: Four times a day (QID) | ORAL | Status: DC | PRN

## 2012-12-11 MED ORDER — TRAZODONE HCL 100 MG PO TABS
200.0000 mg | ORAL_TABLET | Freq: Every day | ORAL | Status: DC
  Administered 2012-12-11: 200 mg via ORAL
  Filled 2012-12-11 (×2): qty 2

## 2012-12-11 MED ORDER — METHOCARBAMOL 100 MG/ML IJ SOLN
500.0000 mg | Freq: Four times a day (QID) | INTRAVENOUS | Status: DC | PRN
  Filled 2012-12-11: qty 5

## 2012-12-11 MED ORDER — KETOROLAC TROMETHAMINE 30 MG/ML IJ SOLN
30.0000 mg | Freq: Once | INTRAMUSCULAR | Status: AC
  Administered 2012-12-11: 30 mg via INTRAVENOUS

## 2012-12-11 MED ORDER — ALBUMIN HUMAN 5 % IV SOLN
INTRAVENOUS | Status: DC | PRN
  Administered 2012-12-11: 18:00:00 via INTRAVENOUS

## 2012-12-11 MED ORDER — SODIUM CHLORIDE 0.9 % IJ SOLN: 3.0000 mL | INTRAMUSCULAR | Status: DC | PRN

## 2012-12-11 MED ORDER — HYDROMORPHONE HCL PF 1 MG/ML IJ SOLN
0.2500 mg | INTRAMUSCULAR | Status: DC | PRN
  Administered 2012-12-11 (×4): 0.5 mg via INTRAVENOUS

## 2012-12-11 MED ORDER — BUPIVACAINE HCL (PF) 0.5 % IJ SOLN
INTRAMUSCULAR | Status: DC | PRN
  Administered 2012-12-11: 3 mL
  Administered 2012-12-11: 20 mL

## 2012-12-11 MED ORDER — METHOCARBAMOL 100 MG/ML IJ SOLN
500.0000 mg | INTRAVENOUS | Status: AC
  Administered 2012-12-11: 500 mg via INTRAVENOUS
  Filled 2012-12-11: qty 5

## 2012-12-11 MED ORDER — FENTANYL CITRATE 0.05 MG/ML IJ SOLN
INTRAMUSCULAR | Status: DC | PRN
  Administered 2012-12-11 (×6): 50 ug via INTRAVENOUS
  Administered 2012-12-11: 100 ug via INTRAVENOUS

## 2012-12-11 MED ORDER — ONDANSETRON HCL 4 MG/2ML IJ SOLN: 4.0000 mg | INTRAMUSCULAR | Status: DC | PRN

## 2012-12-11 MED ORDER — HYDROMORPHONE HCL PF 1 MG/ML IJ SOLN
0.5000 mg | INTRAMUSCULAR | Status: DC | PRN
  Administered 2012-12-11 – 2012-12-12 (×3): 1 mg via INTRAVENOUS
  Filled 2012-12-11 (×3): qty 1

## 2012-12-11 MED ORDER — SODIUM CHLORIDE 0.9 % IV SOLN
INTRAVENOUS | Status: DC
  Administered 2012-12-11: 23:00:00 via INTRAVENOUS

## 2012-12-11 MED ORDER — MIDAZOLAM HCL 5 MG/5ML IJ SOLN
INTRAMUSCULAR | Status: DC | PRN
  Administered 2012-12-11: 2 mg via INTRAVENOUS

## 2012-12-11 MED ORDER — KETOROLAC TROMETHAMINE 15 MG/ML IJ SOLN
15.0000 mg | Freq: Four times a day (QID) | INTRAMUSCULAR | Status: DC
  Administered 2012-12-11 – 2012-12-12 (×2): 15 mg via INTRAVENOUS
  Filled 2012-12-11 (×5): qty 1

## 2012-12-11 MED ORDER — NEOSTIGMINE METHYLSULFATE 1 MG/ML IJ SOLN
INTRAMUSCULAR | Status: DC | PRN
  Administered 2012-12-11: 4 mg via INTRAVENOUS

## 2012-12-11 MED ORDER — PROMETHAZINE HCL 25 MG/ML IJ SOLN: 6.2500 mg | INTRAMUSCULAR | Status: DC | PRN

## 2012-12-11 MED ORDER — CEFAZOLIN SODIUM 1-5 GM-% IV SOLN
1.0000 g | Freq: Three times a day (TID) | INTRAVENOUS | Status: AC
  Administered 2012-12-11 – 2012-12-12 (×2): 1 g via INTRAVENOUS
  Filled 2012-12-11 (×2): qty 50

## 2012-12-11 MED ORDER — ARIPIPRAZOLE 5 MG PO TABS
2.5000 mg | ORAL_TABLET | Freq: Every day | ORAL | Status: DC
  Administered 2012-12-11: 2.5 mg via ORAL
  Filled 2012-12-11 (×2): qty 1

## 2012-12-11 MED ORDER — METHOCARBAMOL 500 MG PO TABS: 500.0000 mg | ORAL_TABLET | Freq: Three times a day (TID) | ORAL | Status: DC | PRN

## 2012-12-11 MED ORDER — ROCURONIUM BROMIDE 100 MG/10ML IV SOLN
INTRAVENOUS | Status: DC | PRN
  Administered 2012-12-11: 50 mg via INTRAVENOUS

## 2012-12-11 MED ORDER — LACTATED RINGERS IV SOLN
INTRAVENOUS | Status: DC | PRN
  Administered 2012-12-11 (×2): via INTRAVENOUS

## 2012-12-11 MED ORDER — ACETAMINOPHEN 650 MG RE SUPP: 650.0000 mg | RECTAL | Status: DC | PRN

## 2012-12-11 MED ORDER — LACTATED RINGERS IV SOLN
INTRAVENOUS | Status: DC
  Administered 2012-12-11: 11:00:00 via INTRAVENOUS

## 2012-12-11 MED ORDER — PHENOL 1.4 % MT LIQD: 1.0000 | OROMUCOSAL | Status: DC | PRN

## 2012-12-11 MED ORDER — LIDOCAINE HCL (CARDIAC) 20 MG/ML IV SOLN
INTRAVENOUS | Status: DC | PRN
  Administered 2012-12-11: 100 mg via INTRAVENOUS

## 2012-12-11 MED ORDER — KETOROLAC TROMETHAMINE 30 MG/ML IJ SOLN
INTRAMUSCULAR | Status: AC
  Filled 2012-12-11: qty 1

## 2012-12-11 MED ORDER — OXYCODONE HCL 5 MG PO TABS
ORAL_TABLET | ORAL | Status: AC
  Filled 2012-12-11: qty 1

## 2012-12-11 MED ORDER — SODIUM CHLORIDE 0.9 % IR SOLN
Status: DC | PRN
  Administered 2012-12-11: 16:00:00

## 2012-12-11 MED ORDER — GLYCOPYRROLATE 0.2 MG/ML IJ SOLN
INTRAMUSCULAR | Status: DC | PRN
  Administered 2012-12-11: 0.2 mg via INTRAVENOUS
  Administered 2012-12-11: .6 mg via INTRAVENOUS

## 2012-12-11 MED ORDER — PANTOPRAZOLE SODIUM 40 MG PO TBEC
40.0000 mg | DELAYED_RELEASE_TABLET | Freq: Every day | ORAL | Status: DC
  Administered 2012-12-12: 40 mg via ORAL
  Filled 2012-12-11: qty 1

## 2012-12-11 MED ORDER — SODIUM CHLORIDE 0.9 % IV SOLN: 250.0000 mL | INTRAVENOUS | Status: DC

## 2012-12-11 SURGICAL SUPPLY — 69 items
ADH SKN CLS APL DERMABOND .7 (GAUZE/BANDAGES/DRESSINGS) ×1
ADH SKN CLS LQ APL DERMABOND (GAUZE/BANDAGES/DRESSINGS) ×1
BAG DECANTER FOR FLEXI CONT (MISCELLANEOUS) ×2 IMPLANT
BLADE SURG ROTATE 9660 (MISCELLANEOUS) IMPLANT
BUR MATCHSTICK NEURO 3.0 LAGG (BURR) ×2 IMPLANT
CAGE 11MM (Cage) ×4 IMPLANT
CANISTER SUCT 3000ML (MISCELLANEOUS) ×2 IMPLANT
CONT SPEC 4OZ CLIKSEAL STRL BL (MISCELLANEOUS) ×4 IMPLANT
COVER BACK TABLE 24X17X13 BIG (DRAPES) IMPLANT
COVER TABLE BACK 60X90 (DRAPES) ×2 IMPLANT
DECANTER SPIKE VIAL GLASS SM (MISCELLANEOUS) ×2 IMPLANT
DERMABOND ADHESIVE PROPEN (GAUZE/BANDAGES/DRESSINGS) ×1
DERMABOND ADVANCED (GAUZE/BANDAGES/DRESSINGS) ×1
DERMABOND ADVANCED .7 DNX12 (GAUZE/BANDAGES/DRESSINGS) ×1 IMPLANT
DERMABOND ADVANCED .7 DNX6 (GAUZE/BANDAGES/DRESSINGS) ×1 IMPLANT
DRAPE C-ARM 42X72 X-RAY (DRAPES) ×4 IMPLANT
DRAPE LAPAROTOMY 100X72X124 (DRAPES) ×2 IMPLANT
DRAPE POUCH INSTRU U-SHP 10X18 (DRAPES) ×2 IMPLANT
DRAPE PROXIMA HALF (DRAPES) IMPLANT
DURAPREP 26ML APPLICATOR (WOUND CARE) ×2 IMPLANT
ELECT CAUTERY BLADE 6.4 (BLADE) ×2 IMPLANT
ELECT REM PT RETURN 9FT ADLT (ELECTROSURGICAL) ×2
ELECTRODE REM PT RTRN 9FT ADLT (ELECTROSURGICAL) ×1 IMPLANT
GAUZE SPONGE 4X4 16PLY XRAY LF (GAUZE/BANDAGES/DRESSINGS) IMPLANT
GLOVE BIO SURGEON STRL SZ 6.5 (GLOVE) ×2 IMPLANT
GLOVE BIOGEL PI IND STRL 6.5 (GLOVE) ×1 IMPLANT
GLOVE BIOGEL PI IND STRL 7.5 (GLOVE) ×3 IMPLANT
GLOVE BIOGEL PI IND STRL 8.5 (GLOVE) ×2 IMPLANT
GLOVE BIOGEL PI INDICATOR 6.5 (GLOVE) ×1
GLOVE BIOGEL PI INDICATOR 7.5 (GLOVE) ×3
GLOVE BIOGEL PI INDICATOR 8.5 (GLOVE) ×2
GLOVE ECLIPSE 7.5 STRL STRAW (GLOVE) ×8 IMPLANT
GLOVE ECLIPSE 8.5 STRL (GLOVE) ×6 IMPLANT
GLOVE EXAM NITRILE LRG STRL (GLOVE) IMPLANT
GLOVE EXAM NITRILE MD LF STRL (GLOVE) ×2 IMPLANT
GLOVE EXAM NITRILE XL STR (GLOVE) IMPLANT
GLOVE EXAM NITRILE XS STR PU (GLOVE) IMPLANT
GOWN BRE IMP SLV AUR LG STRL (GOWN DISPOSABLE) ×2 IMPLANT
GOWN BRE IMP SLV AUR XL STRL (GOWN DISPOSABLE) ×8 IMPLANT
GOWN STRL REIN 2XL LVL4 (GOWN DISPOSABLE) ×4 IMPLANT
HEMOSTAT POWDER KIT SURGIFOAM (HEMOSTASIS) IMPLANT
KIT BASIN OR (CUSTOM PROCEDURE TRAY) ×2 IMPLANT
KIT ROOM TURNOVER OR (KITS) ×2 IMPLANT
MILL MEDIUM DISP (BLADE) ×2 IMPLANT
NEEDLE HYPO 22GX1.5 SAFETY (NEEDLE) ×2 IMPLANT
NS IRRIG 1000ML POUR BTL (IV SOLUTION) ×2 IMPLANT
PACK FOAM VITOSS 10CC (Orthopedic Implant) ×2 IMPLANT
PACK LAMINECTOMY NEURO (CUSTOM PROCEDURE TRAY) ×2 IMPLANT
PAD ARMBOARD 7.5X6 YLW CONV (MISCELLANEOUS) ×6 IMPLANT
PATTIES SURGICAL .5 X1 (DISPOSABLE) IMPLANT
PENCIL BUTTON HOLSTER BLD 10FT (ELECTRODE) ×2 IMPLANT
ROD 45.5X40CM (Rod) ×4 IMPLANT
SCREW 40MM (Screw) ×8 IMPLANT
SCREW SET SPINAL STD HEXALOBE (Screw) ×8 IMPLANT
SPONGE GAUZE 4X4 12PLY (GAUZE/BANDAGES/DRESSINGS) ×2 IMPLANT
SPONGE LAP 4X18 X RAY DECT (DISPOSABLE) IMPLANT
SPONGE SURGIFOAM ABS GEL 100 (HEMOSTASIS) ×2 IMPLANT
SUT VIC AB 1 CT1 18XBRD ANBCTR (SUTURE) ×1 IMPLANT
SUT VIC AB 1 CT1 8-18 (SUTURE) ×2
SUT VIC AB 2-0 CP2 18 (SUTURE) ×2 IMPLANT
SUT VIC AB 3-0 SH 8-18 (SUTURE) ×4 IMPLANT
SYR 20ML ECCENTRIC (SYRINGE) ×2 IMPLANT
SYR 3ML LL SCALE MARK (SYRINGE) ×8 IMPLANT
TAPE CLOTH SURG 4X10 WHT LF (GAUZE/BANDAGES/DRESSINGS) ×2 IMPLANT
TOWEL OR 17X24 6PK STRL BLUE (TOWEL DISPOSABLE) ×2 IMPLANT
TOWEL OR 17X26 10 PK STRL BLUE (TOWEL DISPOSABLE) ×2 IMPLANT
TRAP SPECIMEN MUCOUS 40CC (MISCELLANEOUS) ×2 IMPLANT
TRAY FOLEY CATH 14FRSI W/METER (CATHETERS) ×2 IMPLANT
WATER STERILE IRR 1000ML POUR (IV SOLUTION) ×2 IMPLANT

## 2012-12-11 NOTE — H&P (Signed)
Morgan [name] is an 47 y.o. female.   Chief Complaint: Recurrent back and left leg pain. HPI: Morgan [name] 42 comes a 47 year old individual who has had a herniated nucleus pulposus earlier this year at L4-L5 on left. She underwent a probe discectomy and initially had good relief of pain for approximately 6-7 weeks. At time she then had some recurrence of the pain this is been worsened with substantial back pain that has bothered her. Repeat MRI demonstrates that in addition to the recurrent disc herniation the patient has evolved and developed substantial degeneration of the entirety of the disc space with market Twodot in the endplates. Sedimentation rate and white count have been normal. Because of the degree of degenerative changes at been going on the patient was advised regarding decompression and fusion at the level of L4-L5. She is now admitted for this surgery.  Past Medical History  Diagnosis Date  . Endometriosis   . Postpartum depression     hx of  . Migraine   . Bipolar disorder   . Pelvic kidney     left  . Cervical dysplasia   . GERD (gastroesophageal reflux disease)     Past Surgical History  Procedure Laterality Date  . Pelvic laparoscopy  91  . Cesarean section  03  . Neck surgery  2012  . Gynecologic cryosurgery    . Colposcopy      Family History  Problem Relation Age of Onset  . Diabetes Mother   . Breast cancer Mother 89  . Thyroid disease Mother   . Depression Mother   . Hypertension Father   . Heart disease Father   . Cancer Father     Throat cancer  . Diabetes Maternal Grandmother   . Cancer Maternal Grandmother     uterine  . Depression Maternal Grandmother    Social History:  reports that she has never smoked. She has never used smokeless tobacco. She reports that she does not drink alcohol or use illicit drugs.  Allergies: No Known Allergies  Medications Prior to Admission  Medication Sig Dispense Refill  . ARIPiprazole (ABILIFY) 5 MG tablet Take  2.5 mg by mouth at bedtime.      Marland Kitchen FLUoxetine (PROZAC) 20 MG capsule Take 40 mg by mouth at bedtime.      Marland Kitchen ibuprofen (ADVIL,MOTRIN) 200 MG tablet Take 600 mg by mouth daily as needed for pain.      . methocarbamol (ROBAXIN) 500 MG tablet Take 500 mg by mouth every 8 (eight) hours as needed (muscle spasms).      . naproxen sodium (ALEVE) 220 MG tablet Take 660 mg by mouth daily as needed (pain).      Marland Kitchen omeprazole (PRILOSEC) 20 MG capsule Take 20 mg by mouth at bedtime.       Marland Kitchen oxyCODONE-acetaminophen (PERCOCET/ROXICET) 5-325 MG per tablet Take 1 tablet by mouth every 6 (six) hours as needed for pain.      Marland Kitchen Oxymetazoline HCl (AFRIN NASAL SPRAY NA) Place 1 spray into the nose daily as needed (congestion).      . traZODone (DESYREL) 100 MG tablet Take 200 mg by mouth at bedtime.         No results found for this or any previous visit (from the past 48 hour(s)). No results found.  Review of Systems  Eyes: Negative.   Respiratory: Negative.   Cardiovascular: Negative.   Gastrointestinal: Negative.   Genitourinary: Negative.   Musculoskeletal: Positive for back pain.  Skin: Negative.  Neurological: Positive for tingling, focal weakness and weakness.  Endo/Heme/Allergies: Negative.   Psychiatric/Behavioral: Negative.     Blood pressure 152/96, pulse 63, temperature 97 F (36.1 C), temperature source Oral, resp. rate 18, SpO2 100.00%. Physical Exam  Constitutional: She is oriented to person, place, and time. She appears well-developed and well-nourished.  HENT:  Head: Normocephalic and atraumatic.  Eyes: Conjunctivae and EOM are normal. Pupils are equal, round, and reactive to light.  Neck: Normal range of motion. Neck supple.  Cardiovascular: Normal rate and regular rhythm.   Respiratory: Effort normal and breath sounds normal.  GI: Soft. Bowel sounds are normal.  Musculoskeletal: Normal range of motion.  Moderate tenderness in spasm in the paraspinous musculature in the lower  lumbar spine. Flexion limited to proximally 30 maximally. Straight leg raising is positive at 30 on the right side at 15 on the left side. Patrick maneuver is negative bilaterally.  Neurological: She is alert and oriented to person, place, and time.  Inial nerve examination is within the limits of normal. Lower extremity strength reveals modest weakness in the tibialis anterior on the left side rated at 4/5. Sensation is intact in the distal lower extremities. Deep tendon reflexes absent in the Achilles on the left 2+ and Achilles on the right 2+ in the patella bilaterally upper extremity reflexes are normal.  Skin: Skin is warm.  Psychiatric: She has a normal mood and affect. Her behavior is normal. Judgment and thought content normal.     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  Decompression of L4-L5 with posterior interbody arthrodesis peek spacers local autograft and allograft pedicle screw fixation L4-L5.  Monette Omara J 12/11/2012, 3:37 PM

## 2012-12-11 NOTE — Op Note (Signed)
Date of surgery: 12/11/2012 Preoperative diagnosis: Recurrent herniated nucleus pulposus L4-L5 with left lumbar radiculopathy, lumbar spondylosis Postoperative diagnosis: Recurrent herniated nucleus pulposus L4-L5 with left lumbar radiculopathy, lumbar spondylosis Procedure: L4-L5 decompressive laminectomy decompression of L4 and L5 nerve roots, posterior lumbar interbody arthrodesis with peek spacers local autograft and allograft, pedicle screw fixation L4-L5, posterior lateral arthrodesis L4-L5  Surgeon: Barnett Abu M.D.  Asst.: Karene Fry M.D.  Indications: Patient is Morgan [name] is a 47 y.o. female who who's had significant back pain and lumbar radiculopathy for over a years period time. A lumbar MRI demonstrates advanced spondylosis with high-grade canal stenosis and a large left-sided fragment of disc that had recurred since surgery this past summer.. she was advised regarding surgical intervention.  Procedure: The patient was brought to the operating room supine on a stretcher. After the smooth induction of general endotracheal anesthesia she was turned prone and the back was prepped with alcohol and DuraPrep. The back was then draped sterilely. A midline incision was created and carried down to the lumbar dorsal fascia. A localizing radiograph identified the L4 and L5 spinous processes. A subligamentous dissection was created at L4 and L5 to expose the interlaminar space at L4 and L5 and the facet joints over the L4-L5 interspace. Laminotomies were were then created removing the entire inferior margin of the lamina of L4 including the inferior facet at the L4-L5 joint. The yellow ligament was taken up and the common dural tube was exposed along with the L4 nerve root superiorly, and the L5 nerve root inferiorly, the disc space was exposed and epidural veins in this region were cauterized and divided. On the left side there was encountered significant scar tissue from her recent previous  laminectomy. This was dissected carefully. As the lateral aspect of the dural tube was identified we encountered some fragments of disc material in the free space causing compression of the common dural tube and the take off of the L5 nerve root on the left side. This was decompressed. The L4 nerve roots and the L5 nerve root were dissected with care taken to protect them. The disc space was opened and a combination of curettes and rongeurs was used to evacuate the disc space fully. The endplates were removed using sharp curettes. An interbody spacer was placed to distract the disc space while the contralateral discectomy was performed. When the entirety of the disc was removed and the endplates were prepared final sizing of the disc space was obtained 11mm peek spacers were chosen and packed with autograft and allograft and placed into the interspace. The remainder of the interspace was packed with autograft and allograft. Pedicle entry sites were then chosen using fluoroscopic guidance and 6.5 x 40 mm screws were placed in L4 and 6.5 x 40 mm screws were placed in L5. The lateral gutters were decorticated and graft was packed in the posterolateral gutters between L4 and L5. Final radiographs were obtained after placing appropriately sized rods between the pedicle screws at L4-L5 and torquing these to the appropriate tension. The surgical site was inspected carefully to assure the L4 and L5 nerve roots were well decompressed, hemostasis was obtained, and the graft was well packed. Then the retractors were removed and the wound was closed with #1 Vicryl in the lumbar dorsal fascia 2-0 Vicryl in the subcutaneous tissue and 3-0 Vicryl subcuticularly. When he cc of half percent Marcaine was injected into the paraspinous musculature at the time of closure. Blood loss was estimated at 250  cc. The patient tolerated procedure well and was returned to the recovery room in stable condition.

## 2012-12-11 NOTE — Preoperative (Signed)
Beta Blockers   Reason not to administer Beta Blockers:Not Applicable 

## 2012-12-11 NOTE — Anesthesia Postprocedure Evaluation (Signed)
Anesthesia Post Note  Patient: Morgan [name]  Procedure(s) Performed: Procedure(s) (LRB): Lumbar Four-Five posterior lumbar interbody fusion (N/A)  Anesthesia type: General  Patient location: PACU  Post pain: Pain level controlled and Adequate analgesia  Post assessment: Post-op Vital signs reviewed, Patient's Cardiovascular Status Stable, Respiratory Function Stable, Patent Airway and Pain level controlled  Last Vitals:  Filed Vitals:   12/11/12 1918  BP: 141/90  Pulse: 78  Temp: 36.5 C  Resp: 12    Post vital signs: Reviewed and stable  Level of consciousness: awake, alert  and oriented  Complications: No apparent anesthesia complications

## 2012-12-11 NOTE — Anesthesia Procedure Notes (Signed)
Procedure Name: Intubation Date/Time: 12/11/2012 4:01 PM Performed by: Barnett Abu Pre-anesthesia Checklist: Patient identified, Timeout performed, Emergency Drugs available, Suction available and Patient being monitored Patient Re-evaluated:Patient Re-evaluated prior to inductionOxygen Delivery Method: Circle system utilized Preoxygenation: Pre-oxygenation with 100% oxygen Intubation Type: IV induction Ventilation: Mask ventilation without difficulty Laryngoscope size: Glidescope #3  Grade View: Grade I Tube type: Oral Number of attempts: 1 Airway Equipment and Method: Stylet and Video-laryngoscopy Placement Confirmation: ETT inserted through vocal cords under direct vision,  breath sounds checked- equal and bilateral and positive ETCO2 Secured at: 22 cm Tube secured with: Tape Dental Injury: Teeth and Oropharynx as per pre-operative assessment

## 2012-12-11 NOTE — Transfer of Care (Signed)
Immediate Anesthesia Transfer of Care Note  Patient: Morgan [name]  Procedure(s) Performed: Procedure(s) with comments: Lumbar Four-Five posterior lumbar interbody fusion (N/A) - POSTERIOR LUMBAR FUSION 1 LEVEL  Patient Location: PACU  Anesthesia Type:General  Level of Consciousness: awake, alert  and oriented  Airway & Oxygen Therapy: Patient Spontanous Breathing and Patient connected to nasal cannula oxygen  Post-op Assessment: Report given to PACU RN and Post -op Vital signs reviewed and stable  Post vital signs: Reviewed and stable  Complications: No apparent anesthesia complications

## 2012-12-12 MED ORDER — OXYCODONE-ACETAMINOPHEN 5-325 MG PO TABS: 1.0000 | ORAL_TABLET | ORAL | Status: DC | PRN

## 2012-12-12 MED ORDER — METHOCARBAMOL 500 MG PO TABS: 500.0000 mg | ORAL_TABLET | Freq: Four times a day (QID) | ORAL | Status: DC | PRN

## 2012-12-12 MED FILL — Heparin Sodium (Porcine) Inj 1000 Unit/ML: INTRAMUSCULAR | Qty: 30 | Status: AC

## 2012-12-12 MED FILL — Sodium Chloride IV Soln 0.9%: INTRAVENOUS | Qty: 1000 | Status: AC

## 2012-12-12 MED FILL — Sodium Chloride Irrigation Soln 0.9%: Qty: 3000 | Status: AC

## 2012-12-12 NOTE — Discharge Summary (Signed)
Physician Discharge Summary  Patient ID: Morgan [name] MRN: [id number] DOB/AGE: July 18, 1965 47 y.o.  Admit date: 12/11/2012 Discharge date: 12/12/2012  Admission Diagnoses: Lumbar spondylosis L4-L5 with herniated nucleus pulposus and left lumbar radiculopathy  Discharge Diagnoses: [name] spondylosis L4-L5 with herniated nucleus pulposus, recurrent and left lumbar radiculopathy  Active Problems:   * No active hospital problems. *   Discharged Condition: good  Hospital Course: Patient was admitted to undergo surgical decompression and stabilization at L4-L5. She tolerated the procedure well.  Consults: None  Significant Diagnostic Studies: None  Treatments: surgery: Decompression of L4-L5 via laminectomy posterior interbody arthrodesis using peek spacer local autograft and allograft pedicle screw fixation L4-L5  Discharge Exam: Blood pressure 130/67, pulse 86, temperature 98.5 F (36.9 C), temperature source Oral, resp. rate 16, height 5\' 1"  (1.549 m), weight 77.338 kg (170 lb 8 oz), SpO2 97.00%. Incision is clean and dry motor function is intact station and gait are normal.  Disposition: 01-Home or Self Care  Discharge Orders   Future Orders Complete By Expires   Call MD for:  redness, tenderness, or signs of infection (pain, swelling, redness, odor or green/yellow discharge around incision site)  As directed    Call MD for:  severe uncontrolled pain  As directed    Call MD for:  temperature >100.4  As directed    Diet - low sodium heart healthy  As directed    Discharge instructions  As directed    Comments:     Okay to shower. Do not apply salves or appointments to incision. No heavy lifting with the upper extremities greater than 15 pounds. May resume driving when not requiring pain medication and patient feels comfortable with doing so.   Increase activity slowly  As directed        Medication List         ABILIFY 5 MG tablet  Generic drug:  ARIPiprazole  Take 2.5  mg by mouth at bedtime.     AFRIN NASAL SPRAY NA  Place 1 spray into the nose daily as needed (congestion).     ALEVE 220 MG tablet  Generic drug:  naproxen sodium  Take 660 mg by mouth daily as needed (pain).     FLUoxetine 20 MG capsule  Commonly known as:  PROZAC  Take 40 mg by mouth at bedtime.     ibuprofen 200 MG tablet  Commonly known as:  ADVIL,MOTRIN  Take 600 mg by mouth daily as needed for pain.     methocarbamol 500 MG tablet  Commonly known as:  ROBAXIN  Take 1 tablet (500 mg total) by mouth every 6 (six) hours as needed.     methocarbamol 500 MG tablet  Commonly known as:  ROBAXIN  Take 500 mg by mouth every 8 (eight) hours as needed (muscle spasms).     omeprazole 20 MG capsule  Commonly known as:  PRILOSEC  Take 20 mg by mouth at bedtime.     oxyCODONE-acetaminophen 5-325 MG per tablet  Commonly known as:  PERCOCET/ROXICET  Take 1-2 tablets by mouth every 4 (four) hours as needed for pain.     oxyCODONE-acetaminophen 5-325 MG per tablet  Commonly known as:  PERCOCET/ROXICET  Take 1 tablet by mouth every 6 (six) hours as needed for pain.     traZODone 100 MG tablet  Commonly known as:  DESYREL  Take 200 mg by mouth at bedtime.         SignedStefani Dama 12/12/2012, 4:04 PM

## 2012-12-12 NOTE — Evaluation (Addendum)
Occupational Therapy Evaluation Patient Details Name: Morgan [name] MRN: [id number] DOB: 05/20/1965 Today's Date: 12/12/2012 Time: 4098-1191 OT Time Calculation (min): 28 min  OT Assessment / Plan / Recommendation History of present illness pt presents with L4-5 PLIF.   Clinical Impression   Pt moving well during evaluation. Educated provided to pt. Feel she is safe to d/c home, from OT standpoint, as pt's spouse if available to assist 24/7.    OT Assessment  Patient does not need any further OT services    Follow Up Recommendations  No OT follow up;Supervision/Assistance - 24 hour    Barriers to Discharge      Equipment Recommendations  None recommended by OT    Recommendations for Other Services    Frequency       Precautions / Restrictions Precautions Precautions: Back Precaution Booklet Issued: No Required Braces or Orthoses: Spinal Brace Spinal Brace: Lumbar corset;Applied in sitting position Restrictions Weight Bearing Restrictions: No   Pertinent Vitals/Pain Pain 5/10. Nurse notified.     ADL  Eating/Feeding: Independent Where Assessed - Eating/Feeding: Chair Grooming: Supervision/safety Where Assessed - Grooming: Supported standing Upper Body Bathing: Set up;Supervision/safety Where Assessed - Upper Body Bathing: Supported sitting Lower Body Bathing: Min guard Where Assessed - Lower Body Bathing: Supported sit to stand Upper Body Dressing: Set up;Supervision/safety Where Assessed - Upper Body Dressing: Supported sitting Lower Body Dressing: Min guard Where Assessed - Lower Body Dressing: Supported sit to Pharmacist, hospital: Personnel officer Method: Sit to Barista: Regular height toilet;Comfort height toilet;Grab bars Toileting - Architect and Hygiene: Moderate assistance Where Assessed - Toileting Clothing Manipulation and Hygiene: Standing;Sit on 3-in-1 or toilet (clothing-standing and  hygiene-sitting) Tub/Shower Transfer Method: Not assessed Equipment Used: Gait belt;Back brace;Rolling walker;Reacher;Long-handled shoe horn;Long-handled sponge Transfers/Ambulation Related to ADLs: Min guard for ambulation. Min guard/supervision for transfers. ADL Comments: Pt able to cross legs for LB ADLs-cues for precautions. Pt has reacher at home, so reviewed how to use this for dressing. Discussed tub transfer and using tub wall to step in versus towel rack-pt feels good about tub transfer and did not feel need to practice as she reported she had previous back surgery. Educated on having bag on walker to carry items and having items set up on dominant side of sink as well as using two cups to avoid breaking precautions.     OT Diagnosis:    OT Problem List:   OT Treatment Interventions:     OT Goals(Current goals can be found in the care plan section) Acute Rehab OT Goals Patient Stated Goal: Home  Visit Information  Last OT Received On: 12/12/12 Assistance Needed: +1 History of Present Illness: pt presents with L4-5 PLIF.       Prior Functioning     Home Living Family/patient expects to be discharged to:: Private residence Living Arrangements: Spouse/significant other;Children Available Help at Discharge: Family;Available 24 hours/day Type of Home: House Home Access: Stairs to enter [employer] of Steps: 3 Entrance Stairs-Rails: None Home Layout: One level Home Equipment: Walker - 2 wheels;Shower seat;Adaptive equipment Adaptive Equipment: Reacher Prior Function Level of Independence: Independent Communication Communication: No difficulties Dominant Hand: Right         Vision/Perception     Cognition  Cognition Arousal/Alertness: Awake/alert Behavior During Therapy: WFL for tasks assessed/performed Overall Cognitive Status: Within Functional Limits for tasks assessed    Extremity/Trunk Assessment Upper Extremity Assessment Upper Extremity  Assessment: Overall WFL for tasks assessed Lower Extremity Assessment Lower  Extremity Assessment: Defer to PT evaluation     Mobility Bed Mobility Bed Mobility: Rolling Right;Right Sidelying to Sit Rolling Right: 5: Supervision Right Sidelying to Sit: 5: Supervision Sit to Sidelying Right: 4: Min guard Details for Bed Mobility Assistance: cues for technique Transfers Transfers: Sit to Stand;Stand to Sit Sit to Stand: 5: Supervision;4: Min guard;With upper extremity assist;From bed;From chair/3-in-1;From toilet Stand to Sit: 4: Min guard;5: Supervision;With upper extremity assist;To bed;To chair/3-in-1;To toilet Details for Transfer Assistance: cues for technique and hand placement.     Exercise        End of Session OT - End of Session Equipment Utilized During Treatment: Gait belt;Rolling walker;Back brace Activity Tolerance: Patient tolerated treatment well Patient left: in bed;Other (comment) (PT coming in to work with pt) Nurse Communication: Other (comment) (pain level)  GO     Earlie Raveling OTR/L [phone] 12/12/2012, 12:41 PM

## 2012-12-12 NOTE — Evaluation (Signed)
Physical Therapy Evaluation Patient Details Name: Morgan [name] MRN: [id number] DOB: January 12, 1966 Today's Date: 12/12/2012 Time: 4098-1191 PT Time Calculation (min): 11 min  PT Assessment / Plan / Recommendation History of Present Illness  pt presents with L4-5 PLIF.  Clinical Impression  Pt moving great and anticipate great progress.  Will continue to follow.      PT Assessment  Patient needs continued PT services    Follow Up Recommendations  No PT follow up;Supervision - Intermittent    Does the patient have the potential to tolerate intense rehabilitation      Barriers to Discharge        Equipment Recommendations  Rolling walker with 5" wheels    Recommendations for Other Services     Frequency Min 5X/week    Precautions / Restrictions Precautions Precautions: Back Precaution Booklet Issued: No Required Braces or Orthoses: Spinal Brace Spinal Brace: Lumbar corset;Applied in sitting position Restrictions Weight Bearing Restrictions: No   Pertinent Vitals/Pain 5/10 low back.        Mobility  Bed Mobility Bed Mobility: Sit to Sidelying Right Sit to Sidelying Right: 4: Min guard Details for Bed Mobility Assistance: cues for back precautions and log roll technique.   Transfers Transfers: Sit to Stand;Stand to Sit Sit to Stand: 4: Min guard;With upper extremity assist;From bed Stand to Sit: 4: Min guard;With upper extremity assist;To bed Details for Transfer Assistance: cues for UE use only.   Ambulation/Gait Ambulation/Gait Assistance: 4: Min guard Ambulation Distance (Feet): 300 Feet Assistive device: Rolling walker Ambulation/Gait Assistance Details: pt moves slowly and cautiously, but demos good use of RW.   Gait Pattern: Step-through pattern;Decreased stride length Stairs: No Wheelchair Mobility Wheelchair Mobility: No    Exercises     PT Diagnosis: Difficulty walking;Acute pain  PT Problem List: Decreased strength;Decreased activity  tolerance;Decreased balance;Decreased mobility;Decreased knowledge of use of DME;Decreased knowledge of precautions;Pain PT Treatment Interventions: DME instruction;Stair training;Gait training;Functional mobility training;Therapeutic activities;Therapeutic exercise;Balance training;Patient/family education     PT Goals(Current goals can be found in the care plan section) Acute Rehab PT Goals Patient Stated Goal: Home PT Goal Formulation: With patient Time For Goal Achievement: 12/19/12 Potential to Achieve Goals: Good  Visit Information  Last PT Received On: 12/12/12 Assistance Needed: +1 History of Present Illness: pt presents with L4-5 PLIF.       Prior Functioning  Home Living Family/patient expects to be discharged to:: Private residence Living Arrangements: Spouse/significant other;Children Available Help at Discharge: Family;Available 24 hours/day Type of Home: House Home Access: Stairs to enter [employer] of Steps: 3 Entrance Stairs-Rails: None Home Layout: One level Home Equipment: None Prior Function Level of Independence: Independent Communication Communication: No difficulties    Cognition  Cognition Arousal/Alertness: Awake/alert Behavior During Therapy: WFL for tasks assessed/performed Overall Cognitive Status: Within Functional Limits for tasks assessed    Extremity/Trunk Assessment Upper Extremity Assessment Upper Extremity Assessment: Defer to OT evaluation Lower Extremity Assessment Lower Extremity Assessment: Generalized weakness   Balance Balance Balance Assessed: No  End of Session PT - End of Session Equipment Utilized During Treatment: Gait belt;Back brace Activity Tolerance: Patient tolerated treatment well Patient left: in bed;with call bell/phone within reach Nurse Communication: Mobility status  GP     Sunny Schlein, [place] [phone] 12/12/2012, 9:31 AM

## 2012-12-12 NOTE — Progress Notes (Signed)
UR COMPLETED  

## 2012-12-12 NOTE — Progress Notes (Signed)
Orthopedic Tech Progress Note Patient Details:  Morgan [name] [birth year] [id number]  Patient ID: [name], female   DOB: [birth year], 47 y.o.   MRN: [id number]   Shawnie Pons 12/12/2012, 8:40 AMCalled bio-tech for aspen lumbar fusion brace.

## 2012-12-21 ENCOUNTER — Other Ambulatory Visit: Payer: Self-pay

## 2013-11-02 ENCOUNTER — Emergency Department: Payer: Self-pay | Admitting: Emergency Medicine

## 2013-11-02 LAB — COMPREHENSIVE METABOLIC PANEL
Albumin: 3.8 g/dL (ref 3.4–5.0)
Alkaline Phosphatase: 146 U/L — ABNORMAL HIGH
Anion Gap: 4 — ABNORMAL LOW (ref 7–16)
BUN: 9 mg/dL (ref 7–18)
Bilirubin,Total: 0.3 mg/dL (ref 0.2–1.0)
Calcium, Total: 9.6 mg/dL (ref 8.5–10.1)
Chloride: 106 mmol/L (ref 98–107)
Co2: 28 mmol/L (ref 21–32)
Creatinine: 0.66 mg/dL (ref 0.60–1.30)
EGFR (African American): >60
EGFR (Non-African Amer.): >60
Glucose: 129 mg/dL — ABNORMAL HIGH (ref 65–99)
Osmolality: 276 (ref 275–301)
Potassium: 3.9 mmol/L (ref 3.5–5.1)
SGOT(AST): 63 U/L — ABNORMAL HIGH (ref 15–37)
SGPT (ALT): 71 U/L — ABNORMAL HIGH
Sodium: 138 mmol/L (ref 136–145)
Total Protein: 7.7 g/dL (ref 6.4–8.2)

## 2013-11-02 LAB — CBC WITH DIFFERENTIAL/PLATELET
Basophil #: 0.1 10*3/uL (ref 0.0–0.1)
Basophil %: 1.3 %
Eosinophil #: 0.3 10*3/uL (ref 0.0–0.7)
Eosinophil %: 6.4 %
HCT: 35.1 % (ref 35.0–47.0)
HGB: 11 g/dL — ABNORMAL LOW (ref 12.0–16.0)
Lymphocyte #: 1.7 10*3/uL (ref 1.0–3.6)
Lymphocyte %: 33.4 %
MCH: 23.1 pg — ABNORMAL LOW (ref 26.0–34.0)
MCHC: 31.2 g/dL — ABNORMAL LOW (ref 32.0–36.0)
MCV: 74 fL — ABNORMAL LOW (ref 80–100)
Monocyte #: 0.2 x10 3/mm (ref 0.2–0.9)
Monocyte %: 4.2 %
Neutrophil #: 2.7 10*3/uL (ref 1.4–6.5)
Neutrophil %: 54.7 %
Platelet: 229 10*3/uL (ref 150–440)
RBC: 4.73 10*6/uL (ref 3.80–5.20)
RDW: 18.3 % — ABNORMAL HIGH (ref 11.5–14.5)
WBC: 5 10*3/uL (ref 3.6–11.0)

## 2013-11-02 LAB — URINALYSIS, COMPLETE
Bacteria: NONE SEEN
Bilirubin,UR: NEGATIVE
Blood: NEGATIVE
Glucose,UR: NEGATIVE mg/dL (ref 0–75)
Ketone: NEGATIVE
Leukocyte Esterase: NEGATIVE
Nitrite: NEGATIVE
Ph: 5 (ref 4.5–8.0)
Protein: 30
RBC,UR: <1 /HPF (ref 0–5)
Specific Gravity: 1.013 (ref 1.003–1.030)
Squamous Epithelial: 1
WBC UR: <1 /HPF (ref 0–5)

## 2013-11-02 LAB — TROPONIN I: Troponin-I: <0.02 ng/mL

## 2013-12-17 ENCOUNTER — Encounter (HOSPITAL_COMMUNITY): Payer: Self-pay | Admitting: *Deleted

## 2014-02-15 HISTORY — PX: BREAST BIOPSY: SHX20

## 2014-02-15 HISTORY — PX: BREAST EXCISIONAL BIOPSY: SUR124

## 2014-05-31 ENCOUNTER — Emergency Department
Admit: 2014-05-31 | Disposition: A | Discharge status: Home / Self Care | Payer: Self-pay | Admitting: Emergency Medicine

## 2014-05-31 LAB — URINALYSIS, COMPLETE
Bilirubin,UR: NEGATIVE
Glucose,UR: NEGATIVE mg/dL (ref 0–75)
Ketone: NEGATIVE
Nitrite: NEGATIVE
Ph: 6 (ref 4.5–8.0)
Protein: 30
Specific Gravity: 1.011 (ref 1.003–1.030)

## 2014-08-28 ENCOUNTER — Other Ambulatory Visit: Payer: Self-pay

## 2014-08-29 ENCOUNTER — Ambulatory Visit (INDEPENDENT_AMBULATORY_CARE_PROVIDER_SITE_OTHER): Payer: Federal, State, Local not specified - PPO | Admitting: Gynecology

## 2014-08-29 ENCOUNTER — Encounter: Payer: Self-pay | Admitting: Gynecology

## 2014-08-29 VITALS — BP 124/80 | Ht 62.0 in | Wt 185.0 lb

## 2014-08-29 DIAGNOSIS — N644 Mastodynia: Secondary | ICD-10-CM

## 2014-08-29 NOTE — Progress Notes (Signed)
   Patient is a 49 year old who presented to the office stating that approximately 3 days ago she fell some breast tenderness and thought she felt a lump on her right breast. Patient's mother had breast cancer at the age of 58. Patient has not had a mammogram in over 4 years. Patient is not had a gynecological exam in the past 3 years which was the last time that she was seen in our office. Patient's patient has had no menstrual cycle is amenorrheic has vasomotor symptoms. She scheduled for annual exam next month. Patient not interested in any hormonal replacement therapy at the present time. Her husband has had a vasectomy.  Review of patient's mammogram from 2012 demonstrated that patient had asymmetrical breasts and on ultrasound was noted that her left breast there was a small simple cyst at the 10:00 position 3 cm from the nipple with a measurement of 8 x 9 x 7 mm.  Exam: Blood pressure 124/80 Breast exam was performed in the sitting and supine position. Left breast anatomically normal for her to be larger than her right breast. There was no skin discoloration no nipple inversion no palpable masses no supraclavicular axillary and lymphadenopathy on either breast and she was tender on the right breast at approximately the 2:00 position 2 fingerbreadths from the nipple   assessment/plan: Patient with mastodynia. Patient with past history of left breast cyst. Patient with family history of breast cancer in her mother. Patient overdue for mammogram. No masses palpated on exam but tender on the right breast at the 2:00 position as described above. Patient will be scheduled for diagnostic mammogram on the right breast and screening mammogram on the left. Patient scheduled for annual gynecological exam next month here in our office.

## 2014-09-02 ENCOUNTER — Telehealth: Payer: Self-pay | Admitting: *Deleted

## 2014-09-02 DIAGNOSIS — N644 Mastodynia: Secondary | ICD-10-CM

## 2014-09-02 NOTE — Telephone Encounter (Signed)
-----   Message from Terrance Mass, MD sent at 08/29/2014  3:41 PM EDT ----- Please schedule diagnostic mammogram:  Right breast tenderness 2;00 position 2 cm from areolar area. Patient felt lump few days ago. Mother with history of breast tCancer in her 22's. Need daignostic mammogram of right breast and screening mammogram of left.

## 2014-09-03 NOTE — Telephone Encounter (Signed)
Appointment on 09/06/14 @ 12:40pm pt informed.

## 2014-09-06 ENCOUNTER — Other Ambulatory Visit: Payer: Federal, State, Local not specified - PPO

## 2014-09-09 ENCOUNTER — Ambulatory Visit
Admission: RE | Admit: 2014-09-09 | Discharge: 2014-09-09 | Disposition: A | Discharge status: Home / Self Care | Payer: Medicare Other | Source: Ambulatory Visit | Attending: Gynecology | Admitting: Gynecology

## 2014-09-09 ENCOUNTER — Ambulatory Visit
Admission: RE | Admit: 2014-09-09 | Discharge: 2014-09-09 | Disposition: A | Discharge status: Home / Self Care | Payer: Federal, State, Local not specified - PPO | Source: Ambulatory Visit | Attending: Gynecology | Admitting: Gynecology

## 2014-09-09 DIAGNOSIS — N644 Mastodynia: Secondary | ICD-10-CM

## 2014-09-10 ENCOUNTER — Other Ambulatory Visit: Payer: Self-pay | Admitting: Gynecology

## 2014-09-10 DIAGNOSIS — N644 Mastodynia: Secondary | ICD-10-CM

## 2014-09-11 ENCOUNTER — Other Ambulatory Visit: Payer: Self-pay | Admitting: Gynecology

## 2014-09-11 DIAGNOSIS — N644 Mastodynia: Secondary | ICD-10-CM

## 2014-09-12 ENCOUNTER — Other Ambulatory Visit: Payer: Self-pay | Admitting: Gynecology

## 2014-09-12 ENCOUNTER — Ambulatory Visit
Admission: RE | Admit: 2014-09-12 | Discharge: 2014-09-12 | Disposition: A | Discharge status: Home / Self Care | Payer: Medicare Other | Source: Ambulatory Visit | Attending: Gynecology | Admitting: Gynecology

## 2014-09-12 DIAGNOSIS — N63 Unspecified lump in unspecified breast: Secondary | ICD-10-CM

## 2014-09-12 DIAGNOSIS — N6001 Solitary cyst of right breast: Secondary | ICD-10-CM

## 2014-09-12 DIAGNOSIS — N644 Mastodynia: Secondary | ICD-10-CM

## 2014-09-23 ENCOUNTER — Encounter: Payer: Self-pay | Admitting: Women's Health

## 2014-10-02 ENCOUNTER — Other Ambulatory Visit: Payer: Self-pay | Admitting: General Surgery

## 2014-10-02 DIAGNOSIS — N6021 Fibroadenosis of right breast: Secondary | ICD-10-CM

## 2014-10-14 ENCOUNTER — Other Ambulatory Visit: Payer: Self-pay | Admitting: General Surgery

## 2014-10-14 DIAGNOSIS — N6021 Fibroadenosis of right breast: Secondary | ICD-10-CM

## 2014-10-18 ENCOUNTER — Encounter (HOSPITAL_BASED_OUTPATIENT_CLINIC_OR_DEPARTMENT_OTHER): Payer: Self-pay | Admitting: *Deleted

## 2014-10-23 ENCOUNTER — Ambulatory Visit
Admission: RE | Admit: 2014-10-23 | Discharge: 2014-10-23 | Disposition: A | Discharge status: Home / Self Care | Payer: Federal, State, Local not specified - PPO | Source: Ambulatory Visit | Attending: General Surgery | Admitting: General Surgery

## 2014-10-23 DIAGNOSIS — N6021 Fibroadenosis of right breast: Secondary | ICD-10-CM

## 2014-10-28 ENCOUNTER — Encounter (HOSPITAL_BASED_OUTPATIENT_CLINIC_OR_DEPARTMENT_OTHER)
Admission: RE | Disposition: A | Discharge status: Home / Self Care | Payer: Self-pay | Source: Ambulatory Visit | Attending: General Surgery

## 2014-10-28 ENCOUNTER — Ambulatory Visit (HOSPITAL_BASED_OUTPATIENT_CLINIC_OR_DEPARTMENT_OTHER): Payer: Federal, State, Local not specified - PPO | Admitting: Anesthesiology

## 2014-10-28 ENCOUNTER — Ambulatory Visit
Admission: RE | Admit: 2014-10-28 | Discharge: 2014-10-28 | Disposition: A | Discharge status: Home / Self Care | Payer: Medicare Other | Source: Ambulatory Visit | Attending: General Surgery | Admitting: General Surgery

## 2014-10-28 ENCOUNTER — Encounter (HOSPITAL_BASED_OUTPATIENT_CLINIC_OR_DEPARTMENT_OTHER): Payer: Self-pay | Admitting: Anesthesiology

## 2014-10-28 ENCOUNTER — Ambulatory Visit (HOSPITAL_BASED_OUTPATIENT_CLINIC_OR_DEPARTMENT_OTHER)
Admission: RE | Admit: 2014-10-28 | Discharge: 2014-10-28 | Disposition: A | Discharge status: Home / Self Care | Payer: Federal, State, Local not specified - PPO | Source: Ambulatory Visit | Attending: General Surgery | Admitting: General Surgery

## 2014-10-28 DIAGNOSIS — Z803 Family history of malignant neoplasm of breast: Secondary | ICD-10-CM | POA: Diagnosis not present

## 2014-10-28 DIAGNOSIS — Z888 Allergy status to other drugs, medicaments and biological substances status: Secondary | ICD-10-CM | POA: Insufficient documentation

## 2014-10-28 DIAGNOSIS — F319 Bipolar disorder, unspecified: Secondary | ICD-10-CM | POA: Diagnosis not present

## 2014-10-28 DIAGNOSIS — N6021 Fibroadenosis of right breast: Secondary | ICD-10-CM

## 2014-10-28 DIAGNOSIS — N200 Calculus of kidney: Secondary | ICD-10-CM | POA: Diagnosis not present

## 2014-10-28 DIAGNOSIS — N6091 Unspecified benign mammary dysplasia of right breast: Secondary | ICD-10-CM | POA: Insufficient documentation

## 2014-10-28 DIAGNOSIS — E669 Obesity, unspecified: Secondary | ICD-10-CM | POA: Insufficient documentation

## 2014-10-28 DIAGNOSIS — G43909 Migraine, unspecified, not intractable, without status migrainosus: Secondary | ICD-10-CM | POA: Diagnosis not present

## 2014-10-28 DIAGNOSIS — Z6835 Body mass index (BMI) 35.0-35.9, adult: Secondary | ICD-10-CM | POA: Diagnosis not present

## 2014-10-28 DIAGNOSIS — D241 Benign neoplasm of right breast: Secondary | ICD-10-CM | POA: Diagnosis not present

## 2014-10-28 DIAGNOSIS — K219 Gastro-esophageal reflux disease without esophagitis: Secondary | ICD-10-CM | POA: Insufficient documentation

## 2014-10-28 HISTORY — PX: BREAST LUMPECTOMY WITH RADIOACTIVE SEED LOCALIZATION: SHX6424

## 2014-10-28 HISTORY — DX: Anxiety disorder, unspecified: F41.9

## 2014-10-28 HISTORY — DX: Unspecified lump in unspecified breast: N63.0

## 2014-10-28 SURGERY — BREAST LUMPECTOMY WITH RADIOACTIVE SEED LOCALIZATION
Anesthesia: General | Site: Breast | Laterality: Right

## 2014-10-28 MED ORDER — PROMETHAZINE HCL 25 MG/ML IJ SOLN: 6.2500 mg | INTRAMUSCULAR | Status: DC | PRN

## 2014-10-28 MED ORDER — CHLORHEXIDINE GLUCONATE 4 % EX LIQD: 1.0000 "application " | Freq: Once | CUTANEOUS | Status: DC

## 2014-10-28 MED ORDER — CEFAZOLIN SODIUM-DEXTROSE 2-3 GM-% IV SOLR
2.0000 g | INTRAVENOUS | Status: AC
  Administered 2014-10-28: 2 g via INTRAVENOUS

## 2014-10-28 MED ORDER — FENTANYL CITRATE (PF) 100 MCG/2ML IJ SOLN
INTRAMUSCULAR | Status: AC
  Filled 2014-10-28: qty 4

## 2014-10-28 MED ORDER — FENTANYL CITRATE (PF) 100 MCG/2ML IJ SOLN: 50.0000 ug | INTRAMUSCULAR | Status: DC | PRN

## 2014-10-28 MED ORDER — PROPOFOL 10 MG/ML IV BOLUS
INTRAVENOUS | Status: AC
  Filled 2014-10-28: qty 20

## 2014-10-28 MED ORDER — SCOPOLAMINE 1 MG/3DAYS TD PT72: 1.0000 | MEDICATED_PATCH | Freq: Once | TRANSDERMAL | Status: DC | PRN

## 2014-10-28 MED ORDER — OXYCODONE-ACETAMINOPHEN 5-325 MG PO TABS: 1.0000 | ORAL_TABLET | ORAL | Status: DC | PRN

## 2014-10-28 MED ORDER — BUPIVACAINE-EPINEPHRINE (PF) 0.25% -1:200000 IJ SOLN
INTRAMUSCULAR | Status: DC | PRN
  Administered 2014-10-28: 20 mL

## 2014-10-28 MED ORDER — GLYCOPYRROLATE 0.2 MG/ML IJ SOLN: 0.2000 mg | Freq: Once | INTRAMUSCULAR | Status: DC | PRN

## 2014-10-28 MED ORDER — MIDAZOLAM HCL 2 MG/2ML IJ SOLN: 1.0000 mg | INTRAMUSCULAR | Status: DC | PRN

## 2014-10-28 MED ORDER — CEFAZOLIN SODIUM-DEXTROSE 2-3 GM-% IV SOLR
INTRAVENOUS | Status: AC
  Filled 2014-10-28: qty 50

## 2014-10-28 MED ORDER — LIDOCAINE HCL (CARDIAC) 20 MG/ML IV SOLN
INTRAVENOUS | Status: DC | PRN
  Administered 2014-10-28: 100 mg via INTRAVENOUS

## 2014-10-28 MED ORDER — FENTANYL CITRATE (PF) 100 MCG/2ML IJ SOLN
INTRAMUSCULAR | Status: DC | PRN
  Administered 2014-10-28 (×2): 50 ug via INTRAVENOUS

## 2014-10-28 MED ORDER — FENTANYL CITRATE (PF) 100 MCG/2ML IJ SOLN
INTRAMUSCULAR | Status: AC
  Filled 2014-10-28: qty 2

## 2014-10-28 MED ORDER — OXYCODONE HCL 5 MG PO TABS
5.0000 mg | ORAL_TABLET | Freq: Once | ORAL | Status: AC
  Administered 2014-10-28: 5 mg via ORAL

## 2014-10-28 MED ORDER — LACTATED RINGERS IV SOLN
INTRAVENOUS | Status: DC
  Administered 2014-10-28 (×2): via INTRAVENOUS

## 2014-10-28 MED ORDER — MIDAZOLAM HCL 5 MG/5ML IJ SOLN
INTRAMUSCULAR | Status: DC | PRN
  Administered 2014-10-28: 2 mg via INTRAVENOUS

## 2014-10-28 MED ORDER — PROPOFOL 10 MG/ML IV BOLUS
INTRAVENOUS | Status: DC | PRN
  Administered 2014-10-28: 200 mg via INTRAVENOUS

## 2014-10-28 MED ORDER — ONDANSETRON HCL 4 MG/2ML IJ SOLN
INTRAMUSCULAR | Status: DC | PRN
  Administered 2014-10-28: 4 mg via INTRAVENOUS

## 2014-10-28 MED ORDER — MIDAZOLAM HCL 2 MG/2ML IJ SOLN
INTRAMUSCULAR | Status: AC
  Filled 2014-10-28: qty 4

## 2014-10-28 MED ORDER — ONDANSETRON HCL 4 MG/2ML IJ SOLN
INTRAMUSCULAR | Status: AC
  Filled 2014-10-28: qty 2

## 2014-10-28 MED ORDER — DEXAMETHASONE SODIUM PHOSPHATE 4 MG/ML IJ SOLN
INTRAMUSCULAR | Status: DC | PRN
  Administered 2014-10-28: 10 mg via INTRAVENOUS

## 2014-10-28 MED ORDER — OXYCODONE HCL 5 MG PO TABS
ORAL_TABLET | ORAL | Status: AC
  Filled 2014-10-28: qty 1

## 2014-10-28 MED ORDER — FENTANYL CITRATE (PF) 100 MCG/2ML IJ SOLN
25.0000 ug | INTRAMUSCULAR | Status: DC | PRN
  Administered 2014-10-28 (×2): 50 ug via INTRAVENOUS

## 2014-10-28 MED ORDER — LIDOCAINE HCL (CARDIAC) 20 MG/ML IV SOLN
INTRAVENOUS | Status: AC
  Filled 2014-10-28: qty 5

## 2014-10-28 SURGICAL SUPPLY — 40 items
APPLIER CLIP 9.375 MED OPEN (MISCELLANEOUS) ×3
APR CLP MED 9.3 20 MLT OPN (MISCELLANEOUS) ×1
BLADE SURG 15 STRL LF DISP TIS (BLADE) ×1 IMPLANT
BLADE SURG 15 STRL SS (BLADE) ×3
CANISTER SUC SOCK COL 7IN (MISCELLANEOUS) ×3 IMPLANT
CANISTER SUCT 1200ML W/VALVE (MISCELLANEOUS) ×3 IMPLANT
CHLORAPREP W/TINT 26ML (MISCELLANEOUS) ×3 IMPLANT
CLIP APPLIE 9.375 MED OPEN (MISCELLANEOUS) ×1 IMPLANT
COVER BACK TABLE 60X90IN (DRAPES) ×3 IMPLANT
COVER MAYO STAND STRL (DRAPES) ×3 IMPLANT
COVER PROBE W GEL 5X96 (DRAPES) ×3 IMPLANT
DECANTER SPIKE VIAL GLASS SM (MISCELLANEOUS) IMPLANT
DEVICE DUBIN W/COMP PLATE 8390 (MISCELLANEOUS) ×3 IMPLANT
DRAPE LAPAROSCOPIC ABDOMINAL (DRAPES) IMPLANT
DRAPE UTILITY XL STRL (DRAPES) ×3 IMPLANT
ELECT COATED BLADE 2.86 ST (ELECTRODE) ×3 IMPLANT
ELECT REM PT RETURN 9FT ADLT (ELECTROSURGICAL) ×3
ELECTRODE REM PT RTRN 9FT ADLT (ELECTROSURGICAL) ×1 IMPLANT
GLOVE BIO SURGEON STRL SZ7 (GLOVE) ×3 IMPLANT
GLOVE BIO SURGEON STRL SZ7.5 (GLOVE) ×6 IMPLANT
GLOVE BIOGEL M STRL SZ7.5 (GLOVE) ×3 IMPLANT
GOWN STRL REUS W/ TWL LRG LVL3 (GOWN DISPOSABLE) ×2 IMPLANT
GOWN STRL REUS W/TWL LRG LVL3 (GOWN DISPOSABLE) ×6
KIT MARKER MARGIN INK (KITS) ×3 IMPLANT
LIQUID BAND (GAUZE/BANDAGES/DRESSINGS) ×3 IMPLANT
NEEDLE HYPO 25X1 1.5 SAFETY (NEEDLE) IMPLANT
NS IRRIG 1000ML POUR BTL (IV SOLUTION) IMPLANT
PACK BASIN DAY SURGERY FS (CUSTOM PROCEDURE TRAY) ×3 IMPLANT
PENCIL BUTTON HOLSTER BLD 10FT (ELECTRODE) ×3 IMPLANT
SLEEVE SCD COMPRESS KNEE MED (MISCELLANEOUS) ×3 IMPLANT
SPONGE LAP 18X18 X RAY DECT (DISPOSABLE) ×3 IMPLANT
SUT MON AB 4-0 PC3 18 (SUTURE) IMPLANT
SUT SILK 2 0 SH (SUTURE) IMPLANT
SUT VICRYL 3-0 CR8 SH (SUTURE) ×3 IMPLANT
SYR CONTROL 10ML LL (SYRINGE) IMPLANT
TOWEL OR 17X24 6PK STRL BLUE (TOWEL DISPOSABLE) ×3 IMPLANT
TOWEL OR NON WOVEN STRL DISP B (DISPOSABLE) ×3 IMPLANT
TUBE CONNECTING 20'X1/4 (TUBING) ×1
TUBE CONNECTING 20X1/4 (TUBING) ×2 IMPLANT
YANKAUER SUCT BULB TIP NO VENT (SUCTIONS) IMPLANT

## 2014-10-28 NOTE — H&P (Signed)
Morgan [name] 10/02/2014 10:04 AM Location: [facility] Patient #: [id number] DOB: [birth year] Married / Language: English / Race: White Female  History of Present Illness Sammuel Hines. Marlou Starks MD; 10/02/2014 11:45 AM) Patient words: right breast lesion.  The patient is a 49 year old female who presents with a breast mass. We are asked to see the patient in consultation by Dr. Darcus Austin to evaluate her for a right breast complex sclerosing lesion. The patient is a 49 year old white female who recently felt a lump in the right breast. She brought this to her medical doctor's attention who ordered a mammogram and ultrasound. She was found to have multiple cysts in the right breast which corresponded to the mass that she felt. She was also found to have an abnormal appearing small mass in the lower outer quadrant. This was biopsied and came back as a complex sclerosing lesion. She does have a history of breast cancer in her mother at the age of 43. She does not take any hormone replacement. She denies any breast pain or discharge from the nipple.   Other Problems Ventura Sellers, CMA; 10/02/2014 10:05 AM) Back Pain Depression Gastroesophageal Reflux Disease Kidney Stone Migraine Headache  Past Surgical History Ventura Sellers, [place]; 10/02/2014 10:05 AM) Breast Biopsy Right. Cesarean Section - 1 Spinal Surgery - Lower Back Spinal Surgery - Neck  Diagnostic Studies History Ventura Sellers, [place]; 10/02/2014 10:05 AM) Colonoscopy never Mammogram within last year Pap Smear 1-5 years ago  Allergies Ventura Sellers, CMA; 10/02/2014 10:08 AM) No Known Drug Allergies08/17/2016  Medication History Ventura Sellers, CMA; 10/02/2014 10:07 AM) Abilify (10MG  Tablet, Oral) Active. PROzac (20MG  Capsule, Oral) Active. Omeprazole (20MG  Capsule DR, Oral) Active. Cinnamon (500MG  Tablet, Oral) Active.  Social History Ventura Sellers, [place]; 10/02/2014 10:05  AM) Caffeine use Carbonated beverages, Tea. No alcohol use No drug use Tobacco use Never smoker.  Family History Ventura Sellers, [place]; 10/02/2014 10:05 AM) Alcohol Abuse Father. Anesthetic complications Mother. Arthritis Mother. Breast Cancer Mother. Depression Mother. Diabetes Mellitus Mother. Heart Disease Father. Heart disease in female family member before age 45  Pregnancy / Birth History Ventura Sellers, [place]; 10/02/2014 10:05 AM) Age at menarche 50 years. Age of menopause 32-50 Gravida 1 Maternal age 22-35 Para 1  Review of Systems Sharyn Lull R. Brooks CMA; 10/02/2014 10:05 AM) General Not Present- Appetite Loss, Chills, Fatigue, Fever, Night Sweats, Weight Gain and Weight Loss. Skin Not Present- Change in Wart/Mole, Dryness, Hives, Jaundice, New Lesions, Non-Healing Wounds, Rash and Ulcer. HEENT Present- Seasonal Allergies and Wears glasses/contact lenses. Not Present- Earache, Hearing Loss, Hoarseness, Nose Bleed, Oral Ulcers, Ringing in the Ears, Sinus Pain, Sore Throat, Visual Disturbances and Yellow Eyes. Cardiovascular Not Present- Chest Pain, Difficulty Breathing Lying Down, Leg Cramps, Palpitations, Rapid Heart Rate, Shortness of Breath and Swelling of Extremities. Gastrointestinal Present- Indigestion. Not Present- Abdominal Pain, Bloating, Bloody Stool, Change in Bowel Habits, Chronic diarrhea, Constipation, Difficulty Swallowing, Excessive gas, Gets full quickly at meals, Hemorrhoids, Nausea, Rectal Pain and Vomiting. Female Genitourinary Not Present- Frequency, Nocturia, Painful Urination, Pelvic Pain and Urgency. Musculoskeletal Present- Back Pain. Not Present- Joint Pain, Joint Stiffness, Muscle Pain, Muscle Weakness and Swelling of Extremities. Neurological Not Present- Decreased Memory, Fainting, Headaches, Numbness, Seizures, Tingling, Tremor, Trouble walking and Weakness. Psychiatric Present- Bipolar. Not Present- Anxiety, Change in Sleep  Pattern, Depression, Fearful and Frequent crying. Endocrine Not Present- Cold Intolerance, Excessive Hunger, Hair Changes, Heat Intolerance, Hot flashes and New Diabetes. Hematology Not Present- Easy  Bruising, Excessive bleeding, Gland problems, HIV and Persistent Infections.   Vitals [employer] R. Brooks CMA; 10/02/2014 10:04 AM) 10/02/2014 10:04 AM Weight: 189 lb Height: 61in Body Surface Area: 1.92 m Body Mass Index: 35.71 kg/m BP: 132/84 (Sitting, Left Arm, Standard)    Physical Exam Eddie Dibbles S. Marlou Starks MD; 10/02/2014 11:45 AM) General Mental Status-Alert. General Appearance-Consistent with stated age. Hydration-Well hydrated. Voice-Normal.  Head and Neck Head-normocephalic, atraumatic with no lesions or palpable masses. Trachea-midline. Thyroid Gland Characteristics - normal size and consistency.  Eye Eyeball - Bilateral-Extraocular movements intact. Sclera/Conjunctiva - Bilateral-No scleral icterus.  Chest and Lung Exam Chest and lung exam reveals -quiet, even and easy respiratory effort with no use of accessory muscles and on auscultation, normal breath sounds, no adventitious sounds and normal vocal resonance. Inspection Chest Wall - Normal. Back - normal.  Breast Note: There is no palpable mass in either breast. There is no palpable axillary, supraclavicular, or cervical lymphadenopathy.   Cardiovascular Cardiovascular examination reveals -normal heart sounds, regular rate and rhythm with no murmurs and normal pedal pulses bilaterally.  Abdomen Inspection Inspection of the abdomen reveals - No Hernias. Skin - Scar - no surgical scars. Palpation/Percussion Palpation and Percussion of the abdomen reveal - Soft, Non Tender, No Rebound tenderness, No Rigidity (guarding) and No hepatosplenomegaly. Auscultation Auscultation of the abdomen reveals - Bowel sounds normal.  Neurologic Neurologic evaluation reveals -alert and oriented x 3 with no  impairment of recent or remote memory. Mental Status-Normal.  Musculoskeletal Normal Exam - Left-Upper Extremity Strength Normal and Lower Extremity Strength Normal. Normal Exam - Right-Upper Extremity Strength Normal and Lower Extremity Strength Normal.  Lymphatic Head & Neck  General Head & Neck Lymphatics: Bilateral - Description - Normal. Axillary  General Axillary Region: Bilateral - Description - Normal. Tenderness - Non Tender. Femoral & Inguinal  Generalized Femoral & Inguinal Lymphatics: Bilateral - Description - Normal. Tenderness - Non Tender.    Assessment & Plan Eddie Dibbles S. Marlou Starks MD; 10/02/2014 10:25 AM) SCLEROSING ADENOSIS OF BREAST, RIGHT (610.2  N60.21) Impression: The patient appears to have a complex sclerosing lesion in the lower outer quadrant of the right breast. Because of its abnormal appearance and because it is considered a high risk lesion my recommendation would be to have this area excised. I have discussed with her in detail the risks and benefits of the operation to do this as well as some of the technical aspects and she understands and wishes to proceed. I will plan for a right breast radioactive seed localized lumpectomy Current Plans  Pt Education - Breast Diseases: discussed with patient and provided information.   Signed by Luella Cook, MD (10/02/2014 11:48 AM)

## 2014-10-28 NOTE — Anesthesia Procedure Notes (Signed)
Procedure Name: LMA Insertion Date/Time: 10/28/2014 9:18 AM Performed by: Toula Moos L Pre-anesthesia Checklist: Patient identified, Emergency Drugs available, Suction available, Patient being monitored and Timeout performed Patient Re-evaluated:Patient Re-evaluated prior to inductionOxygen Delivery Method: Circle System Utilized Preoxygenation: Pre-oxygenation with 100% oxygen Intubation Type: IV induction Ventilation: Mask ventilation without difficulty LMA: LMA inserted LMA Size: 4.0 Number of attempts: 1 Airway Equipment and Method: Bite block Placement Confirmation: positive ETCO2 Tube secured with: Tape Dental Injury: Teeth and Oropharynx as per pre-operative assessment

## 2014-10-28 NOTE — Discharge Instructions (Signed)
°  Post Anesthesia Home Care Instructions  Activity: Get plenty of rest for the remainder of the day. A responsible adult should stay with you for 24 hours following the procedure.  For the next 24 hours, DO NOT: -Drive a car -Operate machinery -Drink alcoholic beverages -Take any medication unless instructed by your physician -Make any legal decisions or sign important papers.  Meals: Start with liquid foods such as gelatin or soup. Progress to regular foods as tolerated. Avoid greasy, spicy, heavy foods. If nausea and/or vomiting occur, drink only clear liquids until the nausea and/or vomiting subsides. Call your physician if vomiting continues.  Special Instructions/Symptoms: Your throat may feel dry or sore from the anesthesia or the breathing tube placed in your throat during surgery. If this causes discomfort, gargle with warm salt water. The discomfort should disappear within 24 hours.  If you had a scopolamine patch placed behind your ear for the management of post- operative nausea and/or vomiting:  1. The medication in the patch is effective for 72 hours, after which it should be removed.  Wrap patch in a tissue and discard in the trash. Wash hands thoroughly with soap and water. 2. You may remove the patch earlier than 72 hours if you experience unpleasant side effects which may include dry mouth, dizziness or visual disturbances. 3. Avoid touching the patch. Wash your hands with soap and water after contact with the patch.    Post Anesthesia Home Care Instructions  Activity: Get plenty of rest for the remainder of the day. A responsible adult should stay with you for 24 hours following the procedure.  For the next 24 hours, DO NOT: -Drive a car -Operate machinery -Drink alcoholic beverages -Take any medication unless instructed by your physician -Make any legal decisions or sign important papers.  Meals: Start with liquid foods such as gelatin or soup. Progress to  regular foods as tolerated. Avoid greasy, spicy, heavy foods. If nausea and/or vomiting occur, drink only clear liquids until the nausea and/or vomiting subsides. Call your physician if vomiting continues.  Special Instructions/Symptoms: Your throat may feel dry or sore from the anesthesia or the breathing tube placed in your throat during surgery. If this causes discomfort, gargle with warm salt water. The discomfort should disappear within 24 hours.  If you had a scopolamine patch placed behind your ear for the management of post- operative nausea and/or vomiting:  1. The medication in the patch is effective for 72 hours, after which it should be removed.  Wrap patch in a tissue and discard in the trash. Wash hands thoroughly with soap and water. 2. You may remove the patch earlier than 72 hours if you experience unpleasant side effects which may include dry mouth, dizziness or visual disturbances. 3. Avoid touching the patch. Wash your hands with soap and water after contact with the patch.    

## 2014-10-28 NOTE — Interval H&P Note (Signed)
History and Physical Interval Note:  10/28/2014 8:33 AM  Morgan [name]  has presented today for surgery, with the diagnosis of RIGHT BREAST COMPLEX SCLEROSING LESION  The various methods of treatment have been discussed with the patient and family. After consideration of risks, benefits and other options for treatment, the patient has consented to  Procedure(s): RIGHT BREAST LUMPECTOMY WITH RADIOACTIVE SEED LOCALIZATION (Right) as a surgical intervention .  The patient's history has been reviewed, patient examined, no change in status, stable for surgery.  I have reviewed the patient's chart and labs.  Questions were answered to the patient's satisfaction.     TOTH III,PAUL S

## 2014-10-28 NOTE — Anesthesia Preprocedure Evaluation (Signed)
Anesthesia Evaluation  Patient identified by MRN, date of birth, ID band Patient awake    Reviewed: Allergy & Precautions, NPO status , Patient's Chart, lab work & pertinent test results  History of Anesthesia Complications Negative for: history of anesthetic complications  Airway Mallampati: III  TM Distance: >3 FB Neck ROM: Full    Dental  (+) Dental Advisory Given, Missing,    Pulmonary neg pulmonary ROS,    Pulmonary exam normal breath sounds clear to auscultation       Cardiovascular Exercise Tolerance: Good (-) hypertension(-) angina(-) Past MI negative cardio ROS Normal cardiovascular exam Rhythm:Regular Rate:Normal     Neuro/Psych  Headaches, PSYCHIATRIC DISORDERS Anxiety Depression Bipolar Disorder    GI/Hepatic Neg liver ROS, GERD  Medicated and Controlled,  Endo/Other  Obesity   Renal/GU negative Renal ROS     Musculoskeletal negative musculoskeletal ROS (+)   Abdominal   Peds  Hematology negative hematology ROS (+)   Anesthesia Other Findings Day of surgery medications reviewed with the patient.  Reproductive/Obstetrics                             Anesthesia Physical Anesthesia Plan  ASA: II  Anesthesia Plan: General   Post-op Pain Management:    Induction: Intravenous  Airway Management Planned: LMA  Additional Equipment:   Intra-op Plan:   Post-operative Plan: Extubation in OR  Informed Consent: I have reviewed the patients History and Physical, chart, labs and discussed the procedure including the risks, benefits and alternatives for the proposed anesthesia with the patient or authorized representative who has indicated his/her understanding and acceptance.   Dental advisory given  Plan Discussed with: CRNA  Anesthesia Plan Comments: (Risks/benefits of general anesthesia discussed with patient including risk of damage to teeth, lips, gum, and tongue,  nausea/vomiting, allergic reactions to medications, and the possibility of heart attack, stroke and death.  All patient questions answered.  Patient wishes to proceed.)        Anesthesia Quick Evaluation

## 2014-10-28 NOTE — Op Note (Signed)
10/28/2014  10:03 AM  PATIENT:  Morgan [name]  49 y.o. female  PRE-OPERATIVE DIAGNOSIS:  RIGHT BREAST COMPLEX SCLEROSING LESION  POST-OPERATIVE DIAGNOSIS:  RIGHT BREAST COMPLEX SCLEROSING LESION  PROCEDURE:  Procedure(s): RIGHT BREAST LUMPECTOMY WITH RADIOACTIVE SEED LOCALIZATION (Right)  SURGEON:  Surgeon(s) and Role:    * Jovita Kussmaul, MD - Primary  PHYSICIAN ASSISTANT:   ASSISTANTS: none   ANESTHESIA:   general  EBL:  Total I/O In: 1000 [I.V.:1000] Out: -   BLOOD ADMINISTERED:none  DRAINS: none   LOCAL MEDICATIONS USED:  MARCAINE     SPECIMEN:  Source of Specimen:  right breast tissue  DISPOSITION OF SPECIMEN:  PATHOLOGY  COUNTS:  YES  TOURNIQUET:  * No tourniquets in log *  DICTATION: .Dragon Dictation   After informed consent was obtained the patient was brought to the operating room and placed in the supine position on the operating room table. After adequate induction of general anesthesia the patient's right breast was prepped with ChloraPrep, allowed to dry, and draped in usual sterile manner. Previously an I-125 seed was placed in the lower outer quadrant of the right breast to mark an area of a complex sclerosing lesion. The neoprobe was set to I-125 in the area of radioactivity was readily identified. A radially oriented incision was made with a 15 blade knife overlying the area of radioactivity. The incision was carried through the skin and subcutaneous tissue sharply with electrocautery. While checking the area of radioactivity frequently with the neoprobe a circular portion of breast tissue was excised sharply around the radioactive seed. Once the specimen was removed it was oriented with the appropriate paint colors. Specimen radiograph was obtained that showed the clip in seed to be in the center of the specimen. There was no residual radioactivity in the right breast. The specimen was then sent to pathology for further evaluation. Hemostasis was achieved  using the Bovie electrocautery. The wound was infiltrated with quarter percent Marcaine and irrigated with saline. The deep layer of the wound was then closed with layers of interrupted 3-0 Vicryl stitches. The skin was then closed with interrupted 4-0 Monocryl subcuticular stitches. Dermabond dressings were applied. The patient tolerated the procedure well. At the end of the case all needle sponge and instrument counts were correct. The patient was then awakened and taken to recovery in stable condition.  PLAN OF CARE: Discharge to home after PACU  PATIENT DISPOSITION:  PACU - hemodynamically stable.   Delay start of Pharmacological VTE agent (>24hrs) due to surgical blood loss or risk of bleeding: not applicable

## 2014-10-28 NOTE — Transfer of Care (Signed)
Immediate Anesthesia Transfer of Care Note  Patient: Morgan [name]  Procedure(s) Performed: Procedure(s): RIGHT BREAST LUMPECTOMY WITH RADIOACTIVE SEED LOCALIZATION (Right)  Patient Location: PACU  Anesthesia Type:General  Level of Consciousness: awake and patient cooperative  Airway & Oxygen Therapy: Patient Spontanous Breathing and Patient connected to face mask oxygen  Post-op Assessment: Report given to RN and Post -op Vital signs reviewed and stable  Post vital signs: Reviewed and stable  Last Vitals:  Filed Vitals:   10/28/14 0749  BP: 172/96  Pulse: 91  Temp: 36.7 C  Resp: 20    Complications: No apparent anesthesia complications

## 2014-10-28 NOTE — Anesthesia Postprocedure Evaluation (Signed)
  Anesthesia Post-op Note  Patient: Morgan [name]  Procedure(s) Performed: Procedure(s) (LRB): RIGHT BREAST LUMPECTOMY WITH RADIOACTIVE SEED LOCALIZATION (Right)  Patient Location: PACU  Anesthesia Type: General  Level of Consciousness: awake and alert   Airway and Oxygen Therapy: Patient Spontanous Breathing  Post-op Pain: mild  Post-op Assessment: Post-op Vital signs reviewed, Patient's Cardiovascular Status Stable, Respiratory Function Stable, Patent Airway and No signs of Nausea or vomiting  Last Vitals:  Filed Vitals:   10/28/14 1015  BP: 181/99  Pulse: 103  Temp: 37.6 C  Resp: 18    Post-op Vital Signs: stable   Complications: No apparent anesthesia complications

## 2014-10-29 ENCOUNTER — Encounter (HOSPITAL_BASED_OUTPATIENT_CLINIC_OR_DEPARTMENT_OTHER): Payer: Self-pay | Admitting: General Surgery

## 2014-12-24 ENCOUNTER — Emergency Department: Payer: Federal, State, Local not specified - PPO

## 2014-12-24 ENCOUNTER — Emergency Department
Admission: EM | Admit: 2014-12-24 | Discharge: 2014-12-24 | Disposition: A | Discharge status: Home / Self Care | Payer: Federal, State, Local not specified - PPO | Attending: Emergency Medicine | Admitting: Emergency Medicine

## 2014-12-24 DIAGNOSIS — R1031 Right lower quadrant pain: Secondary | ICD-10-CM | POA: Insufficient documentation

## 2014-12-24 DIAGNOSIS — R110 Nausea: Secondary | ICD-10-CM | POA: Diagnosis not present

## 2014-12-24 DIAGNOSIS — R1032 Left lower quadrant pain: Secondary | ICD-10-CM | POA: Insufficient documentation

## 2014-12-24 DIAGNOSIS — R109 Unspecified abdominal pain: Secondary | ICD-10-CM | POA: Insufficient documentation

## 2014-12-24 DIAGNOSIS — R945 Abnormal results of liver function studies: Secondary | ICD-10-CM | POA: Insufficient documentation

## 2014-12-24 DIAGNOSIS — R1012 Left upper quadrant pain: Secondary | ICD-10-CM | POA: Diagnosis not present

## 2014-12-24 DIAGNOSIS — Z79899 Other long term (current) drug therapy: Secondary | ICD-10-CM | POA: Insufficient documentation

## 2014-12-24 DIAGNOSIS — R7989 Other specified abnormal findings of blood chemistry: Secondary | ICD-10-CM

## 2014-12-24 LAB — COMPREHENSIVE METABOLIC PANEL
ALT: 81 U/L — ABNORMAL HIGH (ref 14–54)
AST: 138 U/L — ABNORMAL HIGH (ref 15–41)
Albumin: 3.8 g/dL (ref 3.5–5.0)
Alkaline Phosphatase: 133 U/L — ABNORMAL HIGH (ref 38–126)
Anion gap: 6 (ref 5–15)
BUN: 9 mg/dL (ref 6–20)
CO2: 29 mmol/L (ref 22–32)
Calcium: 9 mg/dL (ref 8.9–10.3)
Chloride: 103 mmol/L (ref 101–111)
Creatinine, Ser: 0.57 mg/dL (ref 0.44–1.00)
GFR calc Af Amer: >60 mL/min (ref >60)
GFR calc non Af Amer: >60 mL/min (ref >60)
Glucose, Bld: 181 mg/dL — ABNORMAL HIGH (ref 65–99)
Potassium: 3.6 mmol/L (ref 3.5–5.1)
Sodium: 138 mmol/L (ref 135–145)
Total Bilirubin: 0.5 mg/dL (ref 0.3–1.2)
Total Protein: 7 g/dL (ref 6.5–8.1)

## 2014-12-24 LAB — CBC WITH DIFFERENTIAL/PLATELET
Basophils Absolute: 0.1 10*3/uL (ref 0–0.1)
Basophils Relative: 1 %
Eosinophils Absolute: 0.3 10*3/uL (ref 0–0.7)
Eosinophils Relative: 7 %
HCT: 33.7 % — ABNORMAL LOW (ref 35.0–47.0)
Hemoglobin: 11 g/dL — ABNORMAL LOW (ref 12.0–16.0)
Lymphocytes Relative: 29 %
Lymphs Abs: 1.3 10*3/uL (ref 1.0–3.6)
MCH: 23.9 pg — ABNORMAL LOW (ref 26.0–34.0)
MCHC: 32.5 g/dL (ref 32.0–36.0)
MCV: 73.6 fL — ABNORMAL LOW (ref 80.0–100.0)
Monocytes Absolute: 0.2 10*3/uL (ref 0.2–0.9)
Monocytes Relative: 4 %
Neutro Abs: 2.7 10*3/uL (ref 1.4–6.5)
Neutrophils Relative %: 59 %
Platelets: 175 10*3/uL (ref 150–440)
RBC: 4.58 MIL/uL (ref 3.80–5.20)
RDW: 16.3 % — ABNORMAL HIGH (ref 11.5–14.5)
WBC: 4.6 10*3/uL (ref 3.6–11.0)

## 2014-12-24 LAB — URINALYSIS COMPLETE WITH MICROSCOPIC (ARMC ONLY)
Bacteria, UA: NONE SEEN
Bilirubin Urine: NEGATIVE
Glucose, UA: NEGATIVE mg/dL
Hgb urine dipstick: NEGATIVE
Ketones, ur: NEGATIVE mg/dL
Leukocytes, UA: NEGATIVE
Nitrite: NEGATIVE
Protein, ur: 30 mg/dL — AB
Specific Gravity, Urine: 1.009 (ref 1.005–1.030)
pH: 6 (ref 5.0–8.0)

## 2014-12-24 MED ORDER — OXYCODONE HCL 5 MG PO TABS: 5.0000 mg | ORAL_TABLET | Freq: Four times a day (QID) | ORAL | Status: AC | PRN

## 2014-12-24 MED ORDER — KETOROLAC TROMETHAMINE 30 MG/ML IJ SOLN
60.0000 mg | Freq: Once | INTRAMUSCULAR | Status: AC
  Administered 2014-12-24: 60 mg via INTRAMUSCULAR
  Filled 2014-12-24: qty 2

## 2014-12-24 MED ORDER — IBUPROFEN 800 MG PO TABS: 800.0000 mg | ORAL_TABLET | Freq: Three times a day (TID) | ORAL | Status: DC | PRN

## 2014-12-24 NOTE — Discharge Instructions (Signed)
Please make an appointment with your primary care physician for follow-up. You may take Motrin, with food, for mild to moderate pain. Do not take Tylenol until you're cleared to do so by your primary care physician. You may take oxycodone for severe pain. Do not drive within 8 hours of taking oxycodone.   Please return to the emergency department if he develops severe pain, fever, inability to keep down fluids, or any other symptoms concerning to you.  Abdominal Pain, Adult Many things can cause abdominal pain. Usually, abdominal pain is not caused by a disease and will improve without treatment. It can often be observed and treated at home. Your health care provider will do a physical exam and possibly order blood tests and X-rays to help determine the seriousness of your pain. However, in many cases, more time must pass before a clear cause of the pain can be found. Before that point, your health care provider may not know if you need more testing or further treatment. HOME CARE INSTRUCTIONS Monitor your abdominal pain for any changes. The following actions may help to alleviate any discomfort you are experiencing:  Only take over-the-counter or prescription medicines as directed by your health care provider.  Do not take laxatives unless directed to do so by your health care provider.  Try a clear liquid diet (broth, tea, or water) as directed by your health care provider. Slowly move to a bland diet as tolerated. SEEK MEDICAL CARE IF:  You have unexplained abdominal pain.  You have abdominal pain associated with nausea or diarrhea.  You have pain when you urinate or have a bowel movement.  You experience abdominal pain that wakes you in the night.  You have abdominal pain that is worsened or improved by eating food.  You have abdominal pain that is worsened with eating fatty foods.  You have a fever. SEEK IMMEDIATE MEDICAL CARE IF:  Your pain does not go away within 2 hours.  You  keep throwing up (vomiting).  Your pain is felt only in portions of the abdomen, such as the right side or the left lower portion of the abdomen.  You pass bloody or black tarry stools. MAKE SURE YOU:  Understand these instructions.  Will watch your condition.  Will get help right away if you are not doing well or get worse.   This information is not intended to replace advice given to you by your health care provider. Make sure you discuss any questions you have with your health care provider.   Document Released: 11/11/2004 Document Revised: 10/23/2014 Document Reviewed: 10/11/2012 Elsevier Interactive Patient Education [employer].

## 2014-12-24 NOTE — ED Provider Notes (Signed)
[facility] Emergency Department Provider Note  ____________________________________________  Time seen: Approximately 3:04 PM  I have reviewed the triage vital signs and the nursing notes.   HISTORY  Chief Complaint Abdominal Pain    HPI Morgan [name] is a 49 y.o. female with a history of endometriosis, renal colic presenting with bilateral flank pain radiating to the groin. Patient reports that over the last several days she has had bilateral flank pain that radiates to the groin that feels like "cramping." Occasionally, she also gets "sharp" pains that are associated with nausea but no vomiting. No diarrhea. She is having normal bowel movements. She denies any pain or burning with urination, hematuria, fever, chills, change in vaginal discharge. She has tried some oxycodone for her pain with minimal relief.   Past Medical History  Diagnosis Date  . Endometriosis   . Postpartum depression     hx of  . Migraine   . Bipolar disorder ([place])   . Pelvic kidney     left  . Cervical dysplasia   . GERD (gastroesophageal reflux disease)   . Anxiety   . Breast mass     right    Patient Active Problem List   Diagnosis Date Noted  . Cervical dysplasia   . Bipolar disorder [facility] [place])     Past Surgical History  Procedure Laterality Date  . Pelvic laparoscopy  91  . Cesarean section  03  . Neck surgery  2012  . Gynecologic cryosurgery    . Colposcopy    . Back surgery  2014  . Breast lumpectomy with radioactive seed localization Right 10/28/2014    Procedure: RIGHT BREAST LUMPECTOMY WITH RADIOACTIVE SEED LOCALIZATION;  Surgeon: [name], MD;  Location: [facility];  Service: General;  Laterality: Right;    Current Outpatient Rx  Name  Route  Sig  Dispense  Refill  . ARIPiprazole (ABILIFY) 10 MG tablet   Oral   Take 1 tablet by mouth daily.         Marland Kitchen doxepin (SINEQUAN) 75 MG capsule   Oral   Take 1 capsule by mouth as needed.           Marland Kitchen FLUoxetine (PROZAC) 20 MG capsule   Oral   Take 40 mg by mouth at bedtime.         Marland Kitchen ibuprofen (ADVIL,MOTRIN) 800 MG tablet   Oral   Take 1 tablet (800 mg total) by mouth every 8 (eight) hours as needed.   15 tablet   0   . omeprazole (PRILOSEC) 20 MG capsule   Oral   Take 20 mg by mouth at bedtime.          Marland Kitchen oxyCODONE (ROXICODONE) 5 MG immediate release tablet   Oral   Take 1 tablet (5 mg total) by mouth every 6 (six) hours as needed for severe pain.   6 tablet   0   . traMADol (ULTRAM) 50 MG tablet      as needed.           Allergies Lamotrigine  Family History  Problem Relation Age of Onset  . Diabetes Mother   . Breast cancer Mother 46  . Thyroid disease Mother   . Depression Mother   . Hypertension Father   . Heart disease Father   . Cancer Father     Throat cancer  . Diabetes Maternal Grandmother   . Cancer Maternal Grandmother     uterine  . Depression  Maternal Grandmother     Social History Social History  Substance Use Topics  . Smoking status: Never Smoker   . Smokeless tobacco: Never Used  . Alcohol Use: No    Review of Systems Constitutional: No fever/chills. No lightheadedness or syncope. Eyes: No visual changes. ENT: No sore throat. Cardiovascular: Denies chest pain, palpitations. Respiratory: Denies shortness of breath.  No cough. Gastrointestinal: Positive flank pain that radiates into the abdomen. No nausea, no vomiting.  No diarrhea.  No constipation. Genitourinary: Negative for dysuria. No hematuria. Musculoskeletal: Negative for back pain. Skin: Negative for rash. Neurological: Negative for headaches, focal weakness or numbness.  10-point ROS otherwise negative.  ____________________________________________   PHYSICAL EXAM:  VITAL SIGNS: ED Triage Vitals  Enc Vitals Group     BP 12/24/14 1338 175/98 mmHg     Pulse Rate 12/24/14 1338 91     Resp 12/24/14 1338 16     Temp 12/24/14 1338 98.1 F (36.7 C)      Temp Source 12/24/14 1338 Oral     SpO2 12/24/14 1338 100 %     Weight 12/24/14 1338 190 lb (86.183 kg)     Height 12/24/14 1338 5\' 1"  (1.549 m)     Head Cir --      Peak Flow --      Pain Score 12/24/14 1336 8     Pain Loc --      Pain Edu? --      Excl. in [place]? --     Constitutional: Alert and oriented. Well appearing and in no acute distress. Answer question appropriately. Eyes: Conjunctivae are normal.  EOMI. Head: Atraumatic. Nose: No congestion/rhinnorhea. Mouth/Throat: Mucous membranes are mildly dry..  Neck: No stridor.  Supple.   Cardiovascular: Normal rate, regular rhythm. No murmurs, rubs or gallops.  Respiratory: Normal respiratory effort.  No retractions. Lungs CTAB.  No wheezes, rales or ronchi. Gastrointestinal: Abdomen is soft, nondistended, and obese. The patient has diffuse mild nonfocal pain that is most prominent in the left upper quadrant left lower quadrant and right lower quadrant without guarding or rebound or peritoneal signs.  Musculoskeletal: No LE edema.  Neurologic:  Normal speech and language. No gross focal neurologic deficits are appreciated.  Skin:  Skin is warm, dry and intact. No rash noted. Hemangiomas noted over the abdomen. Psychiatric: Mood and affect are normal. Speech and behavior are normal.  Normal judgement.  ____________________________________________   LABS (all labs ordered are listed, but only abnormal results are displayed)  Labs Reviewed  CBC WITH DIFFERENTIAL/PLATELET - Abnormal; Notable for the following:    Hemoglobin 11.0 (*)    HCT 33.7 (*)    MCV 73.6 (*)    MCH 23.9 (*)    RDW 16.3 (*)    All other components within normal limits  COMPREHENSIVE METABOLIC PANEL - Abnormal; Notable for the following:    Glucose, Bld 181 (*)    AST 138 (*)    ALT 81 (*)    Alkaline Phosphatase 133 (*)    All other components within normal limits  URINALYSIS COMPLETEWITH MICROSCOPIC ([facility] ONLY) - Abnormal; Notable for the  following:    Color, Urine YELLOW (*)    APPearance CLEAR (*)    Protein, ur 30 (*)    Squamous Epithelial / LPF 0-5 (*)    All other components within normal limits   ____________________________________________  EKG  Not indicated ____________________________________________  RADIOLOGY  Ct Renal Stone Study  12/24/2014  CLINICAL DATA:  49 year old female  with flank and lumbar back pain radiating to the groin. Nephrolithiasis. Initial encounter. EXAM: CT ABDOMEN AND PELVIS WITHOUT CONTRAST TECHNIQUE: Multidetector CT imaging of the abdomen and pelvis was performed following the standard protocol without IV contrast. COMPARISON:  CT Abdomen and Pelvis 05/31/2014 and earlier. FINDINGS: No pericardial or pleural effusion. Minimal lung base atelectasis or scarring. Postoperative changes to the lower lumbar spine re- demonstrated with solid arthrodesis at L4-L5. Stable hardware. Stable probable benign bone island L2 vertebral body on the right. No acute osseous abnormality identified. No pelvic free fluid. Negative non contrast uterus and adnexa. Decompressed distal colon. Redundant, otherwise negative sigmoid colon. Negative left colon, transverse colon, right colon. Diminutive or absent appendix. Negative terminal ileum. No dilated small bowel. Negative noncontrast stomach and duodenum. Moderate to severe hepatic steatosis is chronic. Negative non contrast gallbladder, spleen, pancreas and adrenal glands. No abdominal free fluid. Orthotopic right kidney with punctate lower pole nephrolithiasis. No right hydronephrosis. Negative right ureter. No perinephric stranding. The left kidney is a pelvic kidney. Punctate nephrolithiasis. No left hydronephrosis. Negative left ureter. Negative urinary bladder. No lymphadenopathy or perinephric stranding. IMPRESSION: 1. No acute findings. Punctate nephrolithiasis with no obstructive uropathy. Congenital left pelvic kidney. 2. Chronic fatty liver disease. 3.  Chronic lower lumbar fusion with solid arthrodesis. Electronically Signed   By: Genevie Ann M.D.   On: 12/24/2014 15:38    ____________________________________________   PROCEDURES  Procedure(s) performed: None  Critical Care performed: No ____________________________________________   INITIAL IMPRESSION / ASSESSMENT AND PLAN / ED COURSE  Pertinent labs & imaging results that were available during my care of the patient were reviewed by me and considered in my medical decision making (see chart for details).  49 y.o. female with a history of endometriosis, renal colic presenting with bilateral flank pain radiating to the groin. The patient is afebrile and has hypertension but otherwise stable vital signs. Her exam is nonfocal and she does not have any CVA tenderness, and her abdominal discomfort is mild and in multiple locations. I will evaluate her for renal colic. She does have abnormal LFTs today and states that she has had this due to medication in the past, which she has stopped and she had those test results. She does not have a Murphy sign, or symptoms that would be suggestive of cholecystitis or gallbladder disease. I have asked her to avoid Tylenol for pain control and to have her primary care physician follow her LFTs after rehydration therapy at home.  The patient's CT scans did not show any cause for her pain. We discussed her abnormal LFTs and she will follow up with her primary care physician. The patient pain was improved after treatment. She was discharged in stable condition. We discussed return precautions and follow-up instructions.  ____________________________________________  FINAL CLINICAL IMPRESSION(S) / ED DIAGNOSES  Final diagnoses:  Bilateral flank pain  Nausea without vomiting  Abnormal LFTs      NEW MEDICATIONS STARTED DURING THIS VISIT:  Discharge Medication List as of 12/24/2014  3:52 PM    START taking these medications   Details  oxyCODONE  (ROXICODONE) 5 MG immediate release tablet Take 1 tablet (5 mg total) by mouth every 6 (six) hours as needed for severe pain., Starting 12/24/2014, Until Wed 12/24/15, Print         Eula Listen, MD 12/24/14 2021

## 2014-12-24 NOTE — ED Notes (Signed)
Pt to ed with c/o lower back pain with radiation to bilat groin.  Pt states hx of kidney stones.  Pt reports nausea.  Pt denies difficulty urinating.

## 2015-04-20 ENCOUNTER — Emergency Department: Payer: BLUE CROSS/BLUE SHIELD

## 2015-04-20 ENCOUNTER — Encounter: Payer: Self-pay | Admitting: Emergency Medicine

## 2015-04-20 ENCOUNTER — Emergency Department
Admission: EM | Admit: 2015-04-20 | Discharge: 2015-04-20 | Disposition: A | Discharge status: Home / Self Care | Payer: BLUE CROSS/BLUE SHIELD | Attending: Emergency Medicine | Admitting: Emergency Medicine

## 2015-04-20 DIAGNOSIS — Z79899 Other long term (current) drug therapy: Secondary | ICD-10-CM | POA: Insufficient documentation

## 2015-04-20 DIAGNOSIS — I159 Secondary hypertension, unspecified: Secondary | ICD-10-CM | POA: Diagnosis not present

## 2015-04-20 DIAGNOSIS — R079 Chest pain, unspecified: Secondary | ICD-10-CM | POA: Diagnosis not present

## 2015-04-20 LAB — CBC
HCT: 33.9 % — ABNORMAL LOW (ref 35.0–47.0)
Hemoglobin: 11 g/dL — ABNORMAL LOW (ref 12.0–16.0)
MCH: 23.9 pg — ABNORMAL LOW (ref 26.0–34.0)
MCHC: 32.5 g/dL (ref 32.0–36.0)
MCV: 73.7 fL — ABNORMAL LOW (ref 80.0–100.0)
Platelets: 179 10*3/uL (ref 150–440)
RBC: 4.6 MIL/uL (ref 3.80–5.20)
RDW: 16.7 % — ABNORMAL HIGH (ref 11.5–14.5)
WBC: 6.4 10*3/uL (ref 3.6–11.0)

## 2015-04-20 LAB — BASIC METABOLIC PANEL
Anion gap: 5 (ref 5–15)
BUN: 16 mg/dL (ref 6–20)
CO2: 28 mmol/L (ref 22–32)
Calcium: 9.1 mg/dL (ref 8.9–10.3)
Chloride: 106 mmol/L (ref 101–111)
Creatinine, Ser: 0.67 mg/dL (ref 0.44–1.00)
GFR calc Af Amer: >60 mL/min (ref >60)
GFR calc non Af Amer: >60 mL/min (ref >60)
Glucose, Bld: 95 mg/dL (ref 65–99)
Potassium: 3.8 mmol/L (ref 3.5–5.1)
Sodium: 139 mmol/L (ref 135–145)

## 2015-04-20 LAB — TROPONIN I: Troponin I: <0.03 ng/mL (ref <0.031)

## 2015-04-20 NOTE — Discharge Instructions (Signed)
Please seek medical attention for any high fevers, chest pain, shortness of breath, change in behavior, persistent vomiting, bloody stool or any other new or concerning symptoms. ° ° °Nonspecific Chest Pain  °Chest pain can be caused by many different conditions. There is always a chance that your pain could be related to something serious, such as a heart attack or a blood clot in your lungs. Chest pain can also be caused by conditions that are not life-threatening. If you have chest pain, it is very important to follow up with your health care provider. °CAUSES  °Chest pain can be caused by: °· Heartburn. °· Pneumonia or bronchitis. °· Anxiety or stress. °· Inflammation around your heart (pericarditis) or lung (pleuritis or pleurisy). °· A blood clot in your lung. °· A collapsed lung (pneumothorax). It can develop suddenly on its own (spontaneous pneumothorax) or from trauma to the chest. °· Shingles infection (varicella-zoster virus). °· Heart attack. °· Damage to the bones, muscles, and cartilage that make up your chest wall. This can include: °¨ Bruised bones due to injury. °¨ Strained muscles or cartilage due to frequent or repeated coughing or overwork. °¨ Fracture to one or more ribs. °¨ Sore cartilage due to inflammation (costochondritis). °RISK FACTORS  °Risk factors for chest pain may include: °· Activities that increase your risk for trauma or injury to your chest. °· Respiratory infections or conditions that cause frequent coughing. °· Medical conditions or overeating that can cause heartburn. °· Heart disease or family history of heart disease. °· Conditions or health behaviors that increase your risk of developing a blood clot. °· Having had chicken pox (varicella zoster). °SIGNS AND SYMPTOMS °Chest pain can feel like: °· Burning or tingling on the surface of your chest or deep in your chest. °· Crushing, pressure, aching, or squeezing pain. °· Dull or sharp pain that is worse when you move, cough, or  take a deep breath. °· Pain that is also felt in your back, neck, shoulder, or arm, or pain that spreads to any of these areas. °Your chest pain may come and go, or it may stay constant. °DIAGNOSIS °Lab tests or other studies may be needed to find the cause of your pain. Your health care provider may have you take a test called an ambulatory ECG (electrocardiogram). An ECG records your heartbeat patterns at the time the test is performed. You may also have other tests, such as: °· Transthoracic echocardiogram (TTE). During echocardiography, sound waves are used to create a picture of all of the heart structures and to look at how blood flows through your heart. °· Transesophageal echocardiogram (TEE). This is a more advanced imaging test that obtains images from inside your body. It allows your health care provider to see your heart in finer detail. °· Cardiac monitoring. This allows your health care provider to monitor your heart rate and rhythm in real time. °· Holter monitor. This is a portable device that records your heartbeat and can help to diagnose abnormal heartbeats. It allows your health care provider to track your heart activity for several days, if needed. °· Stress tests. These can be done through exercise or by taking medicine that makes your heart beat more quickly. °· Blood tests. °· Imaging tests. °TREATMENT  °Your treatment depends on what is causing your chest pain. Treatment may include: °· Medicines. These may include: °¨ Acid blockers for heartburn. °¨ Anti-inflammatory medicine. °¨ Pain medicine for inflammatory conditions. °¨ Antibiotic medicine, if an infection is present. °¨ Medicines   to dissolve blood clots. °¨ Medicines to treat coronary artery disease. °· Supportive care for conditions that do not require medicines. This may include: °¨ Resting. °¨ Applying heat or cold packs to injured areas. °¨ Limiting activities until pain decreases. °HOME CARE INSTRUCTIONS °· If you were prescribed  an antibiotic medicine, finish it all even if you start to feel better. °· Avoid any activities that bring on chest pain. °· Do not use any tobacco products, including cigarettes, chewing tobacco, or electronic cigarettes. If you need help quitting, ask your health care provider. °· Do not drink alcohol. °· Take medicines only as directed by your health care provider. °· Keep all follow-up visits as directed by your health care provider. This is important. This includes any further testing if your chest pain does not go away. °· If heartburn is the cause for your chest pain, you may be told to keep your head raised (elevated) while sleeping. This reduces the chance that acid will go from your stomach into your esophagus. °· Make lifestyle changes as directed by your health care provider. These may include: °¨ Getting regular exercise. Ask your health care provider to suggest some activities that are safe for you. °¨ Eating a heart-healthy diet. A registered dietitian can help you to learn healthy eating options. °¨ Maintaining a healthy weight. °¨ Managing diabetes, if necessary. °¨ Reducing stress. °SEEK MEDICAL CARE IF: °· Your chest pain does not go away after treatment. °· You have a rash with blisters on your chest. °· You have a fever. °SEEK IMMEDIATE MEDICAL CARE IF:  °· Your chest pain is worse. °· You have an increasing cough, or you cough up blood. °· You have severe abdominal pain. °· You have severe weakness. °· You faint. °· You have chills. °· You have sudden, unexplained chest discomfort. °· You have sudden, unexplained discomfort in your arms, back, neck, or jaw. °· You have shortness of breath at any time. °· You suddenly start to sweat, or your skin gets clammy. °· You feel nauseous or you vomit. °· You suddenly feel light-headed or dizzy. °· Your heart begins to beat quickly, or it feels like it is skipping beats. °These symptoms may represent a serious problem that is an emergency. Do not wait to  see if the symptoms will go away. Get medical help right away. Call your local emergency services (911 in the [place] Do not drive yourself to the hospital. °  °This information is not intended to replace advice given to you by your health care provider. Make sure you discuss any questions you have with your health care provider. °  °Document Released: 11/11/2004 Document Revised: 02/22/2014 Document Reviewed: 09/07/2013 °Elsevier Interactive Patient Education ©2016 [employer] ° °

## 2015-04-20 NOTE — ED Notes (Signed)
Pt had a headache so checked her bp and it was high. States cp on and off x 3 days

## 2015-04-20 NOTE — ED Provider Notes (Signed)
[facility] Emergency Department Provider Note   ____________________________________________  Time seen: ~1945  I have reviewed the triage vital signs and the nursing notes.   HISTORY  Chief Complaint Chest Pain   History limited by: Not Limited   HPI Morgan [name] is a 50 y.o. female who presents to the emergency department today because of chest pain.The patient states that this is been going on and off for the past 3 days. She describes on the left side. It is a discomfort. It is intermittent. It is not associated with any shortness of breath or diaphoresis. The patient states that in addition she has noticed that her blood pressure has been elevated. She has been checking in a pharmacy. She denies any history of high blood pressure. Denies any recent fevers.     Past Medical History  Diagnosis Date  . Endometriosis   . Postpartum depression     hx of  . Migraine   . Bipolar disorder ([place])   . Pelvic kidney     left  . Cervical dysplasia   . GERD (gastroesophageal reflux disease)   . Anxiety   . Breast mass     right    Patient Active Problem List   Diagnosis Date Noted  . Cervical dysplasia   . Bipolar disorder [facility])     Past Surgical History  Procedure Laterality Date  . Pelvic laparoscopy  91  . Cesarean section  03  . Neck surgery  2012  . Gynecologic cryosurgery    . Colposcopy    . Back surgery  2014  . Breast lumpectomy with radioactive seed localization Right 10/28/2014    Procedure: RIGHT BREAST LUMPECTOMY WITH RADIOACTIVE SEED LOCALIZATION;  Surgeon: [name], MD;  Location: [facility];  Service: General;  Laterality: Right;    Current Outpatient Rx  Name  Route  Sig  Dispense  Refill  . ARIPiprazole (ABILIFY) 10 MG tablet   Oral   Take 1 tablet by mouth daily.         Marland Kitchen doxepin (SINEQUAN) 75 MG capsule   Oral   Take 1 capsule by mouth as needed.          Marland Kitchen FLUoxetine (PROZAC) 20 MG  capsule   Oral   Take 40 mg by mouth at bedtime.         Marland Kitchen ibuprofen (ADVIL,MOTRIN) 800 MG tablet   Oral   Take 1 tablet (800 mg total) by mouth every 8 (eight) hours as needed.   15 tablet   0   . omeprazole (PRILOSEC) 20 MG capsule   Oral   Take 20 mg by mouth at bedtime.          Marland Kitchen oxyCODONE (ROXICODONE) 5 MG immediate release tablet   Oral   Take 1 tablet (5 mg total) by mouth every 6 (six) hours as needed for severe pain.   6 tablet   0   . traMADol (ULTRAM) 50 MG tablet      as needed.           Allergies Lamotrigine  Family History  Problem Relation Age of Onset  . Diabetes Mother   . Breast cancer Mother 68  . Thyroid disease Mother   . Depression Mother   . Hypertension Father   . Heart disease Father   . Cancer Father     Throat cancer  . Diabetes Maternal Grandmother   . Cancer Maternal Grandmother  uterine  . Depression Maternal Grandmother     Social History Social History  Substance Use Topics  . Smoking status: Never Smoker   . Smokeless tobacco: Never Used  . Alcohol Use: No    Review of Systems  Constitutional: Negative for fever. Cardiovascular: Positive for chest pain. Respiratory: Negative for shortness of breath. Gastrointestinal: Negative for abdominal pain, vomiting and diarrhea. Neurological: Positive for headache  10-point ROS otherwise negative.  ____________________________________________   PHYSICAL EXAM:  VITAL SIGNS: ED Triage Vitals  Enc Vitals Group     BP 04/20/15 1620 163/81 mmHg     Pulse Rate 04/20/15 1620 71     Resp 04/20/15 1620 18     Temp 04/20/15 1620 97.5 F (36.4 C)     Temp Source 04/20/15 1620 Oral     SpO2 04/20/15 1620 100 %     Weight 04/20/15 1620 185 lb (83.915 kg)     Height 04/20/15 1620 5\' 1"  (1.549 m)     Head Cir --      Peak Flow --      Pain Score 04/20/15 1622 3   Constitutional: Alert and oriented. Well appearing and in no distress. Eyes: Conjunctivae are normal.  PERRL. Normal extraocular movements. ENT   Head: Normocephalic and atraumatic.   Nose: No congestion/rhinnorhea.   Mouth/Throat: Mucous membranes are moist.   Neck: No stridor. Hematological/Lymphatic/Immunilogical: No cervical lymphadenopathy. Cardiovascular: Normal rate, regular rhythm.  No murmurs, rubs, or gallops. Respiratory: Normal respiratory effort without tachypnea nor retractions. Breath sounds are clear and equal bilaterally. No wheezes/rales/rhonchi. Gastrointestinal: Soft and nontender. No distention. There is no CVA tenderness. Genitourinary: Deferred Musculoskeletal: Normal range of motion in all extremities. No joint effusions.  No lower extremity tenderness nor edema. Neurologic:  Normal speech and language. No gross focal neurologic deficits are appreciated.  Skin:  Skin is warm, dry and intact. No rash noted. Psychiatric: Mood and affect are normal. Speech and behavior are normal. Patient exhibits appropriate insight and judgment.  ____________________________________________    LABS (pertinent positives/negatives)  Labs Reviewed  CBC - Abnormal; Notable for the following:    Hemoglobin 11.0 (*)    HCT 33.9 (*)    MCV 73.7 (*)    MCH 23.9 (*)    RDW 16.7 (*)    All other components within normal limits  BASIC METABOLIC PANEL  TROPONIN I     ____________________________________________   EKG  I, Nance Pear, attending physician, personally viewed and interpreted this EKG  EKG Time: 1634 Rate: 72 Rhythm: normal sinus rhythm Axis: normal Intervals: qtc 400 QRS: narrow ST changes: no st elevation Impression: normal ekg   ____________________________________________    RADIOLOGY  CXR  IMPRESSION: No evidence of acute cardiopulmonary disease.  ____________________________________________   PROCEDURES  Procedure(s) performed: None  Critical Care performed: No  ____________________________________________   INITIAL  IMPRESSION / ASSESSMENT AND PLAN / ED COURSE  Pertinent labs & imaging results that were available during my care of the patient were reviewed by me and considered in my medical decision making (see chart for details).  Patient presented to the emergency department today because of concerns for chest pain the setting of high blood pressure. Patient's heart score of 2. This point I think patient certainly safe for follow-up. I doubt ACS. I will however give cardiac all given that father had a heart attack.  ____________________________________________   FINAL CLINICAL IMPRESSION(S) / ED DIAGNOSES  Final diagnoses:  Secondary hypertension, unspecified  Chest pain, unspecified chest  pain type     Nance Pear, MD 04/22/15 [phone]

## 2016-01-20 ENCOUNTER — Encounter: Payer: Self-pay | Admitting: Women's Health

## 2016-01-20 ENCOUNTER — Ambulatory Visit (INDEPENDENT_AMBULATORY_CARE_PROVIDER_SITE_OTHER): Payer: Federal, State, Local not specified - PPO | Admitting: Women's Health

## 2016-01-20 VITALS — Ht 62.0 in | Wt 187.0 lb

## 2016-01-20 DIAGNOSIS — Z13820 Encounter for screening for osteoporosis: Secondary | ICD-10-CM | POA: Diagnosis not present

## 2016-01-20 DIAGNOSIS — Z01419 Encounter for gynecological examination (general) (routine) without abnormal findings: Secondary | ICD-10-CM

## 2016-01-20 DIAGNOSIS — Z780 Asymptomatic menopausal state: Secondary | ICD-10-CM

## 2016-01-20 NOTE — Progress Notes (Signed)
Morgan [name] 1965-05-25 [id number]    History:    Presents for annual exam. Was last here 2013. Postmenopausal for 5 years with no bleeding/no HRT, 2013 [place] 88.  10/2014 negative breast lumpectomy overdue for mammogram. Abnormal Pap many years ago with cryo-with normal Paps since. Last Pap 2012. History of endometriosis, pain-free. On disability due to bipolar disease. Reports right breast  outer aspect sharp shooting pain several days ago but tenderness persists. Nice injury or change in routine.   Past medical history, past surgical history, family history and social history were all reviewed and documented in the EPIC chart. 48 year old son doing well.  ROS:  A ROS was performed and pertinent positives and negatives are included.  Exam:  Vitals:   01/20/16 1354  Weight: 187 lb (84.8 kg)  Height: 5\' 2"  (1.575 m)   Body mass index is 34.2 kg/m.   General appearance:  Normal Thyroid:  Symmetrical, normal in size, without palpable masses or nodularity. Respiratory  Auscultation:  Clear without wheezing or rhonchi Cardiovascular  Auscultation:  Regular rate, without rubs, murmurs or gallops  Edema/varicosities:  Not grossly evident Abdominal  Soft,nontender, without masses, guarding or rebound.  Liver/spleen:  No organomegaly noted  Hernia:  None appreciated  Skin  Inspection:  Grossly normal   Breasts: Examined lying and sitting. Pendulous    Right: Without masses, retractions, discharge or axillary adenopathy.     Left: Without masses, retractions, discharge or axillary adenopathy. Gentitourinary   Inguinal/mons:  Normal without inguinal adenopathy  External genitalia:  Normal  BUS/Urethra/Skene's glands:  Normal  Vagina:  Normal  Cervix:  Normal  Uterus:  normal in size, shape and contour.  Midline and mobile  Adnexa/parametria:     Rt: Without masses or tenderness.   Lt: Without masses or tenderness.  Anus and perineum: Normal  Digital rectal exam: Normal  sphincter tone without palpated masses or tenderness  Assessment/Plan:  50 y.o. MWF G1 P1 for annual exam with complaint of right breast pain outer aspect.  Postmenopausal on no HRT with no bleeding Right breast mastodynia Obesity Hypertension-primary care manages labs and meds Bipolar disease psychiatrist and manages.  Plan: Diagnostic mammogram for right breast. Reviewed importance of annual screen. SBE's, exercise, calcium rich diet, vitamin D 1000 daily encouraged. Instructed to have primary care check vitamin D level. Schedule DEXA. Reviewed importance of weightbearing exercise, decreasing calories for weight loss. UA, Pap with HR HPV typing, new screening guidelines reviewed. Declined flu vaccine.    Huel Cote [facility], 4:51 PM 01/20/2016

## 2016-01-20 NOTE — Patient Instructions (Signed)
Health Maintenance for Postmenopausal Women Introduction Menopause is a normal process in which your reproductive ability comes to an end. This process happens gradually over a span of months to years, usually between the ages of 20 and 50. Menopause is complete when you have missed 12 consecutive menstrual periods. It is important to talk with your health care provider about some of the most common conditions that affect postmenopausal women, such as heart disease, cancer, and bone loss (osteoporosis). Adopting a healthy lifestyle and getting preventive care can help to promote your health and wellness. Those actions can also lower your chances of developing some of these common conditions. What should I know about menopause? During menopause, you may experience a number of symptoms, such as:  Moderate-to-severe hot flashes.  Night sweats.  Decrease in sex drive.  Mood swings.  Headaches.  Tiredness.  Irritability.  Memory problems.  Insomnia. Choosing to treat or not to treat menopausal changes is an individual decision that you make with your health care provider. What should I know about hormone replacement therapy and supplements? Hormone therapy products are effective for treating symptoms that are associated with menopause, such as hot flashes and night sweats. Hormone replacement carries certain risks, especially as you become older. If you are thinking about using estrogen or estrogen with progestin treatments, discuss the benefits and risks with your health care provider. What should I know about heart disease and stroke? Heart disease, heart attack, and stroke become more likely as you age. This may be due, in part, to the hormonal changes that your body experiences during menopause. These can affect how your body processes dietary fats, triglycerides, and cholesterol. Heart attack and stroke are both medical emergencies. There are many things that you can do to help prevent  heart disease and stroke:  Have your blood pressure checked at least every 1-2 years. High blood pressure causes heart disease and increases the risk of stroke.  If you are 49-18 years old, ask your health care provider if you should take aspirin to prevent a heart attack or a stroke.  Do not use any tobacco products, including cigarettes, chewing tobacco, or electronic cigarettes. If you need help quitting, ask your health care provider.  It is important to eat a healthy diet and maintain a healthy weight.  Be sure to include plenty of vegetables, fruits, low-fat dairy products, and lean protein.  Avoid eating foods that are high in solid fats, added sugars, or salt (sodium).  Get regular exercise. This is one of the most important things that you can do for your health.  Try to exercise for at least 150 minutes each week. The type of exercise that you do should increase your heart rate and make you sweat. This is known as moderate-intensity exercise.  Try to do strengthening exercises at least twice each week. Do these in addition to the moderate-intensity exercise.  Know your numbers.Ask your health care provider to check your cholesterol and your blood glucose. Continue to have your blood tested as directed by your health care provider. What should I know about cancer screening? There are several types of cancer. Take the following steps to reduce your risk and to catch any cancer development as early as possible. Breast Cancer  Practice breast self-awareness.  This means understanding how your breasts normally appear and feel.  It also means doing regular breast self-exams. Let your health care provider know about any changes, no matter how small.  If you are 40 or  older, have a clinician do a breast exam (clinical breast exam or CBE) every year. Depending on your age, family history, and medical history, it may be recommended that you also have a yearly breast X-ray  (mammogram).  If you have a family history of breast cancer, talk with your health care provider about genetic screening.  If you are at high risk for breast cancer, talk with your health care provider about having an MRI and a mammogram every year.  Breast cancer (BRCA) gene test is recommended for women who have family members with BRCA-related cancers. Results of the assessment will determine the need for genetic counseling and BRCA1 and for BRCA2 testing. BRCA-related cancers include these types:  Breast. This occurs in males or females.  Ovarian.  Tubal. This may also be called fallopian tube cancer.  Cancer of the abdominal or pelvic lining (peritoneal cancer).  Prostate.  Pancreatic. Cervical, Uterine, and Ovarian Cancer  Your health care provider may recommend that you be screened regularly for cancer of the pelvic organs. These include your ovaries, uterus, and vagina. This screening involves a pelvic exam, which includes checking for microscopic changes to the surface of your cervix (Pap test).  For women ages 21-65, health care providers may recommend a pelvic exam and a Pap test every three years. For women ages 50-65, they may recommend the Pap test and pelvic exam, combined with testing for human papilloma virus (HPV), every five years. Some types of HPV increase your risk of cervical cancer. Testing for HPV may also be done on women of any age who have unclear Pap test results.  Other health care providers may not recommend any screening for nonpregnant women who are considered low risk for pelvic cancer and have no symptoms. Ask your health care provider if a screening pelvic exam is right for you.  If you have had past treatment for cervical cancer or a condition that could lead to cancer, you need Pap tests and screening for cancer for at least 20 years after your treatment. If Pap tests have been discontinued for you, your risk factors (such as having a new sexual  partner) need to be reassessed to determine if you should start having screenings again. Some women have medical problems that increase the chance of getting cervical cancer. In these cases, your health care provider may recommend that you have screening and Pap tests more often.  If you have a family history of uterine cancer or ovarian cancer, talk with your health care provider about genetic screening.  If you have vaginal bleeding after reaching menopause, tell your health care provider.  There are currently no reliable tests available to screen for ovarian cancer. Lung Cancer  Lung cancer screening is recommended for adults 59-37 years old who are at high risk for lung cancer because of a history of smoking. A yearly low-dose CT scan of the lungs is recommended if you:  Currently smoke.  Have a history of at least 30 pack-years of smoking and you currently smoke or have quit within the past 15 years. A pack-year is smoking an average of one pack of cigarettes per day for one year. Yearly screening should:  Continue until it has been 15 years since you quit.  Stop if you develop a health problem that would prevent you from having lung cancer treatment. Colorectal Cancer  This type of cancer can be detected and can often be prevented.  Routine colorectal cancer screening usually begins at age 9 and  continues through age 73.  If you have risk factors for colon cancer, your health care provider may recommend that you be screened at an earlier age.  If you have a family history of colorectal cancer, talk with your health care provider about genetic screening.  Your health care provider may also recommend using home test kits to check for hidden blood in your stool.  A small camera at the end of a tube can be used to examine your colon directly (sigmoidoscopy or colonoscopy). This is done to check for the earliest forms of colorectal cancer.  Direct examination of the colon should be  repeated every 5-10 years until age 39. However, if early forms of precancerous polyps or small growths are found or if you have a family history or genetic risk for colorectal cancer, you may need to be screened more often. Skin Cancer  Check your skin from head to toe regularly.  Monitor any moles. Be sure to tell your health care provider:  About any new moles or changes in moles, especially if there is a change in a mole's shape or color.  If you have a mole that is larger than the size of a pencil eraser.  If any of your family members has a history of skin cancer, especially at a Kellene Mccleary age, talk with your health care provider about genetic screening.  Always use sunscreen. Apply sunscreen liberally and repeatedly throughout the day.  Whenever you are outside, protect yourself by wearing long sleeves, pants, a wide-brimmed hat, and sunglasses. What should I know about osteoporosis? Osteoporosis is a condition in which bone destruction happens more quickly than new bone creation. After menopause, you may be at an increased risk for osteoporosis. To help prevent osteoporosis or the bone fractures that can happen because of osteoporosis, the following is recommended:  If you are 59-26 years old, get at least 1,000 mg of calcium and at least 600 mg of vitamin D per day.  If you are older than age 1 but younger than age 67, get at least 1,200 mg of calcium and at least 600 mg of vitamin D per day.  If you are older than age 38, get at least 1,200 mg of calcium and at least 800 mg of vitamin D per day. Smoking and excessive alcohol intake increase the risk of osteoporosis. Eat foods that are rich in calcium and vitamin D, and do weight-bearing exercises several times each week as directed by your health care provider. What should I know about how menopause affects my mental health? Depression may occur at any age, but it is more common as you become older. Common symptoms of depression  include:  Low or sad mood.  Changes in sleep patterns.  Changes in appetite or eating patterns.  Feeling an overall lack of motivation or enjoyment of activities that you previously enjoyed.  Frequent crying spells. Talk with your health care provider if you think that you are experiencing depression. What should I know about immunizations? It is important that you get and maintain your immunizations. These include:  Tetanus, diphtheria, and pertussis (Tdap) booster vaccine.  Influenza every year before the flu season begins.  Pneumonia vaccine.  Shingles vaccine. Your health care provider may also recommend other immunizations. This information is not intended to replace advice given to you by your health care provider. Make sure you discuss any questions you have with your health care provider. Document Released: 03/26/2005 Document Revised: 08/22/2015 Document Reviewed: 11/05/2014  2017  Elsevier Breast Tenderness Breast tenderness is a common problem for women of all ages. Breast tenderness may cause mild discomfort to severe pain. The pain usually comes and goes in association with your menstrual cycle, but it can be constant. Breast tenderness has many possible causes, including hormone changes and some medicines. Your health care provider may order tests, such as a mammogram or an ultrasound, to check for any unusual findings. Having breast tenderness usually does not mean that you have breast cancer. Follow these instructions at home: Sometimes, reassurance that you do not have breast cancer is all that is needed. In general, follow these home care instructions: Managing pain and discomfort  If directed, apply ice to the area:  Put ice in a plastic bag.  Place a towel between your skin and the bag.  Leave the ice on for 20 minutes, 2-3 times a day.  Make sure you are wearing a supportive bra, especially during exercise. You may also want to wear a supportive bra while  sleeping if your breasts are very tender. Medicines  Take over-the-counter and prescription medicines only as told by your health care provider. If the cause of your pain is infection, you may be prescribed an antibiotic medicine.  If you were prescribed an antibiotic, take it as told by your health care provider. Do not stop taking the antibiotic even if you start to feel better. General instructions  Your health care provider may recommend that you reduce the amount of fat in your diet. You can do this by:  Limiting fried foods.  Cooking foods using methods, such as baking, boiling, grilling, and broiling.  Decrease the amount of caffeine in your diet. You can do this by drinking more water and choosing caffeine-free options.  Keep a log of the days and times when your breasts are most tender.  Ask your health care provider how to do breast exams at home. This will help you notice if you have an unusual growth or lump. Contact a health care provider if:  Any part of your breast is hard, red, and hot to the touch. This may be a sign of infection.  You are not breastfeeding and you have fluid, especially blood or pus, coming out of your nipples.  You have a fever.  You have a new or painful lump in your breast that remains after your menstrual period ends.  Your pain does not improve or it gets worse.  Your pain is interfering with your daily activities. This information is not intended to replace advice given to you by your health care provider. Make sure you discuss any questions you have with your health care provider. Document Released: 01/15/2008 Document Revised: 10/31/2015 Document Reviewed: 10/31/2015 Elsevier Interactive Patient Education  2017 [employer].

## 2016-01-21 ENCOUNTER — Telehealth: Payer: Self-pay | Admitting: *Deleted

## 2016-01-21 DIAGNOSIS — N644 Mastodynia: Secondary | ICD-10-CM

## 2016-01-21 LAB — URINALYSIS W MICROSCOPIC + REFLEX CULTURE
Bacteria, UA: NONE SEEN [HPF]
Bilirubin Urine: NEGATIVE
Casts: NONE SEEN [LPF]
Glucose, UA: NEGATIVE
Hgb urine dipstick: NEGATIVE
Ketones, ur: NEGATIVE
Leukocytes, UA: NEGATIVE
Nitrite: NEGATIVE
Protein, ur: NEGATIVE
Specific Gravity, Urine: 1.02 (ref 1.001–1.035)
WBC, UA: NONE SEEN WBC/HPF (ref <=5)
Yeast: NONE SEEN [HPF]
pH: 5.5 (ref 5.0–8.0)

## 2016-01-21 LAB — PAP, TP IMAGING W/ HPV RNA, RFLX HPV TYPE 16,18/45: HPV mRNA, High Risk: NOT DETECTED

## 2016-01-21 NOTE — Telephone Encounter (Signed)
-----   Message from Huel Cote, NP sent at 01/20/2016  3:36 PM EST ----- Needs a diagnostic right breast mammogram, having right breast  outer aspect/quadrant sharp shooting type pain for several days. No change in exam no palpable nodules, visible dimpling or retractions. Last mammogram greater than one year ago.

## 2016-01-21 NOTE — Telephone Encounter (Signed)
Appointment 01/27/16 @ 7:40am at breast center pt informed

## 2016-01-22 LAB — URINE CULTURE: Organism ID, Bacteria: NO GROWTH

## 2016-01-27 ENCOUNTER — Other Ambulatory Visit: Payer: BLUE CROSS/BLUE SHIELD

## 2016-02-02 ENCOUNTER — Encounter: Payer: Self-pay | Admitting: Women's Health

## 2016-02-02 ENCOUNTER — Ambulatory Visit
Admission: RE | Admit: 2016-02-02 | Discharge: 2016-02-02 | Disposition: A | Discharge status: Home / Self Care | Payer: Medicare Other | Source: Ambulatory Visit | Attending: Women's Health | Admitting: Women's Health

## 2016-02-02 DIAGNOSIS — N644 Mastodynia: Secondary | ICD-10-CM

## 2016-06-30 ENCOUNTER — Encounter: Payer: Self-pay | Admitting: Gynecology

## 2016-12-06 ENCOUNTER — Emergency Department: Payer: Federal, State, Local not specified - PPO

## 2016-12-06 ENCOUNTER — Encounter: Payer: Self-pay | Admitting: Emergency Medicine

## 2016-12-06 DIAGNOSIS — G43809 Other migraine, not intractable, without status migrainosus: Secondary | ICD-10-CM | POA: Insufficient documentation

## 2016-12-06 DIAGNOSIS — R42 Dizziness and giddiness: Secondary | ICD-10-CM | POA: Diagnosis not present

## 2016-12-06 DIAGNOSIS — R200 Anesthesia of skin: Secondary | ICD-10-CM | POA: Insufficient documentation

## 2016-12-06 DIAGNOSIS — Z79899 Other long term (current) drug therapy: Secondary | ICD-10-CM | POA: Diagnosis not present

## 2016-12-06 DIAGNOSIS — R51 Headache: Secondary | ICD-10-CM | POA: Diagnosis present

## 2016-12-06 LAB — URINALYSIS, COMPLETE (UACMP) WITH MICROSCOPIC
Bilirubin Urine: NEGATIVE
Glucose, UA: NEGATIVE mg/dL
Hgb urine dipstick: NEGATIVE
Ketones, ur: NEGATIVE mg/dL
Leukocytes, UA: NEGATIVE
Nitrite: NEGATIVE
Protein, ur: 30 mg/dL — AB
Specific Gravity, Urine: 1.02 (ref 1.005–1.030)
pH: 5 (ref 5.0–8.0)

## 2016-12-06 LAB — CBC
HCT: 37.9 % (ref 35.0–47.0)
Hemoglobin: 12.4 g/dL (ref 12.0–16.0)
MCH: 26.8 pg (ref 26.0–34.0)
MCHC: 32.6 g/dL (ref 32.0–36.0)
MCV: 82.2 fL (ref 80.0–100.0)
Platelets: 172 10*3/uL (ref 150–440)
RBC: 4.61 MIL/uL (ref 3.80–5.20)
RDW: 15 % — ABNORMAL HIGH (ref 11.5–14.5)
WBC: 6 10*3/uL (ref 3.6–11.0)

## 2016-12-06 LAB — BASIC METABOLIC PANEL
Anion gap: 7 (ref 5–15)
BUN: 10 mg/dL (ref 6–20)
CO2: 28 mmol/L (ref 22–32)
Calcium: 9.3 mg/dL (ref 8.9–10.3)
Chloride: 104 mmol/L (ref 101–111)
Creatinine, Ser: 0.64 mg/dL (ref 0.44–1.00)
GFR calc Af Amer: >60 mL/min (ref >60)
GFR calc non Af Amer: >60 mL/min (ref >60)
Glucose, Bld: 139 mg/dL — ABNORMAL HIGH (ref 65–99)
Potassium: 3.7 mmol/L (ref 3.5–5.1)
Sodium: 139 mmol/L (ref 135–145)

## 2016-12-06 NOTE — ED Triage Notes (Signed)
Pt c/o right side and posterior HA since this AM as well as dizziness and numbness to the left arm. Pt does not had facial droop, slurred speech nor drift present. Pts grip is strong bilaterally. Pt denies chest pain, N/V as well as LOC.

## 2016-12-07 ENCOUNTER — Emergency Department: Payer: Federal, State, Local not specified - PPO

## 2016-12-07 ENCOUNTER — Emergency Department
Admission: EM | Admit: 2016-12-07 | Discharge: 2016-12-07 | Disposition: A | Discharge status: Home / Self Care | Payer: Federal, State, Local not specified - PPO | Attending: Emergency Medicine | Admitting: Emergency Medicine

## 2016-12-07 DIAGNOSIS — R42 Dizziness and giddiness: Secondary | ICD-10-CM

## 2016-12-07 DIAGNOSIS — G43809 Other migraine, not intractable, without status migrainosus: Secondary | ICD-10-CM

## 2016-12-07 MED ORDER — METOCLOPRAMIDE HCL 5 MG/ML IJ SOLN
10.0000 mg | Freq: Once | INTRAMUSCULAR | Status: AC
  Administered 2016-12-07: 10 mg via INTRAVENOUS
  Filled 2016-12-07: qty 2

## 2016-12-07 MED ORDER — BUTALBITAL-APAP-CAFFEINE 50-325-40 MG PO TABS: 1.0000 | ORAL_TABLET | Freq: Four times a day (QID) | ORAL | 0 refills | Status: DC | PRN

## 2016-12-07 MED ORDER — DIPHENHYDRAMINE HCL 50 MG/ML IJ SOLN
25.0000 mg | Freq: Once | INTRAMUSCULAR | Status: AC
  Administered 2016-12-07: 25 mg via INTRAVENOUS
  Filled 2016-12-07: qty 1

## 2016-12-07 MED ORDER — SODIUM CHLORIDE 0.9 % IV BOLUS (SEPSIS)
1000.0000 mL | Freq: Once | INTRAVENOUS | Status: AC
  Administered 2016-12-07: 1000 mL via INTRAVENOUS

## 2016-12-07 MED ORDER — KETOROLAC TROMETHAMINE 30 MG/ML IJ SOLN
30.0000 mg | Freq: Once | INTRAMUSCULAR | Status: AC
  Administered 2016-12-07: 30 mg via INTRAVENOUS
  Filled 2016-12-07: qty 1

## 2016-12-07 NOTE — Discharge Instructions (Signed)
Please follow up with neurology for further evaluation.

## 2016-12-07 NOTE — ED Provider Notes (Signed)
[facility] Emergency Department Provider Note   ____________________________________________   First MD Initiated Contact with Patient 12/07/16 [phone]     (approximate)  I have reviewed the triage vital signs and the nursing notes.   HISTORY  Chief Complaint Migraine and Dizziness    HPI Morgan [name] is a 51 y.o. female who comes into the hospital today with a headache dizziness and facial numbness. The patient reports that she's had a bad headache with shaking and dizziness since 2 nights ago. She reports that last night around 10 at 10:30 her left face arm and leg went numb. She reports it has persisted. She denies any weakness but states that she's never had this before. The patient does have a history of migraines but has never had any neurologic suppleassociated. The patient has been taking tramadol and ibuprofen for her pain but it has not been helping. The patient's pain is a 7 out of 10 in intensity. She does have some nausea but denies any vomiting. She is sensitive to light and sounds. The patient is here today for evaluation.   Past Medical History:  Diagnosis Date  . Anxiety   . Bipolar disorder ([place])   . Breast mass    right  . Cervical dysplasia   . Endometriosis   . GERD (gastroesophageal reflux disease)   . Migraine   . Pelvic kidney    left  . Postpartum depression    hx of    Patient Active Problem List   Diagnosis Date Noted  . Cervical dysplasia   . Bipolar disorder [facility])     Past Surgical History:  Procedure Laterality Date  . BACK SURGERY  2014  . BREAST LUMPECTOMY WITH RADIOACTIVE SEED LOCALIZATION Right 10/28/2014   Procedure: RIGHT BREAST LUMPECTOMY WITH RADIOACTIVE SEED LOCALIZATION;  Surgeon: [name], MD;  Location: [place];  Service: General;  Laterality: Right;  . CESAREAN SECTION  03  . COLPOSCOPY    . GYNECOLOGIC CRYOSURGERY    . NECK SURGERY  2012  . PELVIC LAPAROSCOPY  91     Prior to Admission medications   Medication Sig Start Date End Date Taking? Authorizing Provider  ARIPiprazole (ABILIFY) 10 MG tablet Take 1 tablet by mouth daily. 08/07/14   [provider]  butalbital-acetaminophen-caffeine (FIORICET, ESGIC) 50-325-40 MG tablet Take 1-2 tablets by mouth every 6 (six) hours as needed for headache. 12/07/16 12/07/17  Loney Hering, MD  desvenlafaxine (PRISTIQ) 100 MG 24 hr tablet Take 100 mg by mouth daily.    [provider]  doxepin (SINEQUAN) 75 MG capsule Take 1 capsule by mouth as needed.  05/23/14   [provider]  FLUoxetine (PROZAC) 20 MG capsule Take 40 mg by mouth at bedtime.    [provider]  ibuprofen (ADVIL,MOTRIN) 800 MG tablet Take 1 tablet (800 mg total) by mouth every 8 (eight) hours as needed. 12/24/14   Eula Listen, MD  losartan (COZAAR) 50 MG tablet Take 50 mg by mouth daily.    [provider]    Allergies Lamotrigine  Family History  Problem Relation Age of Onset  . Diabetes Mother   . Breast cancer Mother 22  . Thyroid disease Mother   . Depression Mother   . Hypertension Father   . Heart disease Father   . Cancer Father        Throat cancer  . Diabetes Maternal Grandmother   . Cancer Maternal Grandmother  uterine  . Depression Maternal Grandmother     Social History Social History  Substance Use Topics  . Smoking status: Never Smoker  . Smokeless tobacco: Never Used  . Alcohol use No    Review of Systems  Constitutional: No fever/chills Eyes: No visual changes. ENT: No sore throat. Cardiovascular: Denies chest pain. Respiratory: Denies shortness of breath. Gastrointestinal: nausea with No abdominal pain. no vomiting.  No diarrhea.  No constipation. Genitourinary: Negative for dysuria. Musculoskeletal: Negative for back pain. Skin: Negative for rash. Neurological: headache and left-sided numbness,  dizziness   ____________________________________________   PHYSICAL EXAM:  VITAL SIGNS: ED Triage Vitals  Enc Vitals Group     BP 12/06/16 2310 (!) 173/92     Pulse Rate 12/06/16 2310 81     Resp 12/06/16 2310 18     Temp 12/06/16 2310 98 F (36.7 C)     Temp Source 12/06/16 2310 Oral     SpO2 12/06/16 2310 100 %     Weight 12/06/16 2311 187 lb (84.8 kg)     Height --      Head Circumference --      Peak Flow --      Pain Score --      Pain Loc --      Pain Edu? --      Excl. in [place]? --     Constitutional: Alert and oriented. Well appearing and in mild distress. Eyes: Conjunctivae are normal. PERRL. EOMI. Head: Atraumatic. Nose: No congestion/rhinnorhea. Mouth/Throat: Mucous membranes are moist.  Oropharynx non-erythematous. Cardiovascular: Normal rate, regular rhythm. Grossly normal heart sounds.  Good peripheral circulation. Respiratory: Normal respiratory effort.  No retractions. Lungs CTAB. Gastrointestinal: Soft and nontender. No distention. positive bowel sounds Musculoskeletal: No lower extremity tenderness nor edema.   Neurologic:  Normal speech and language. patient with some left-sided facial, left upper extremity and left lower extremity decreased sensation, otherwise strength intact throughout, no pronator drift no difficulty with finger to nose no difficulty with alternating rapid movements. Remaining cranial nerves II through XII are grossly intact  Skin:  Skin is warm, dry and intact.  Psychiatric: Mood and affect are normal.   ____________________________________________   LABS (all labs ordered are listed, but only abnormal results are displayed)  Labs Reviewed  BASIC METABOLIC PANEL - Abnormal; Notable for the following:       Result Value   Glucose, Bld 139 (*)    All other components within normal limits  CBC - Abnormal; Notable for the following:    RDW 15.0 (*)    All other components within normal limits  URINALYSIS, COMPLETE (UACMP) WITH  MICROSCOPIC - Abnormal; Notable for the following:    Color, Urine YELLOW (*)    APPearance HAZY (*)    Protein, ur 30 (*)    Bacteria, UA RARE (*)    Squamous Epithelial / LPF 0-5 (*)    All other components within normal limits  CBG MONITORING, ED   ____________________________________________  EKG  ED ECG REPORT I, Loney Hering, the attending physician, personally viewed and interpreted this ECG.   Date: 12/06/2016  EKG Time: 2315  Rate: 81  Rhythm: normal sinus rhythm  Axis: normal  Intervals:none  ST&T Change: none  ____________________________________________  RADIOLOGY  Ct Head Wo Contrast  Result Date: 12/07/2016 CLINICAL DATA:  Acute onset of right-sided and posterior headache. Dizziness and left arm numbness. Initial encounter. EXAM: CT HEAD WITHOUT CONTRAST TECHNIQUE: Contiguous axial images were obtained from the  base of the skull through the vertex without intravenous contrast. COMPARISON:  CT of the head performed 11/02/2013 FINDINGS: Brain: No evidence of acute infarction, hemorrhage, hydrocephalus, extra-axial collection or mass lesion/mass effect. The posterior fossa, including the cerebellum, brainstem and fourth ventricle, is within normal limits. The third and lateral ventricles, and basal ganglia are unremarkable in appearance. The cerebral hemispheres are symmetric in appearance, with normal gray-white differentiation. No mass effect or midline shift is seen. Vascular: No hyperdense vessel or unexpected calcification. Skull: There is no evidence of fracture; visualized osseous structures are unremarkable in appearance. Sinuses/Orbits: The visualized portions of the orbits are within normal limits. The paranasal sinuses and mastoid air cells are well-aerated. Other: No significant soft tissue abnormalities are seen. IMPRESSION: Unremarkable noncontrast CT of the head. Electronically Signed   By: Garald Balding M.D.   On: 12/07/2016 00:12   Mr Brain Wo  Contrast  Result Date: 12/07/2016 CLINICAL DATA:  51 y/o F; right-sided in posterior headache with numbness to the left arm. EXAM: MRI HEAD WITHOUT CONTRAST TECHNIQUE: Multiplanar, multiecho pulse sequences of the brain and surrounding structures were obtained without intravenous contrast. COMPARISON:  12/06/2016 CT head.  11/12/2010 MRI head. FINDINGS: Brain: No acute infarction, hemorrhage, hydrocephalus, extra-axial collection or mass lesion. Vascular: Normal flow voids. Skull and upper cervical spine: Normal marrow signal. Sinuses/Orbits: Small left maxillary sinus mucous retention cyst. Otherwise negative. Other: None. IMPRESSION: No acute intracranial abnormality. Unremarkable stable MRI of the brain. Electronically Signed   By: Kristine Garbe M.D.   On: 12/07/2016 04:01    ____________________________________________   PROCEDURES  Procedure(s) performed: None  Procedures  Critical Care performed: No  ____________________________________________   INITIAL IMPRESSION / ASSESSMENT AND PLAN / ED COURSE  As part of my medical decision making, I reviewed the following data within the electronic MEDICAL RECORD NUMBER Notes from prior ED visits and [place] Controlled Substance Database   This is a 51 year old female who comes into the hospital today with a headache, dizziness and some left-sided numbness. Although the patient has a history of migraine she was concerned so decided to come in.  My differential diagnosis includes complex migraine, stroke, intracranial hemorrhage.  The patient did receive a CT scan which was unremarkable. She also received some Reglan, Benadryl and Toradol for her pain. The patient did get a liter of normal saline. Given the patient's symptoms and its persistence I decided to send her for an MRI. While she has not previously had a complex migraine I wanted to ensure that this was not a stroke. The patient's MRI was unremarkable. After the medication the  patient states that she does feel much improved. She'll be discharged home to follow-up with her primary care physician.      ____________________________________________   FINAL CLINICAL IMPRESSION(S) / ED DIAGNOSES  Final diagnoses:  Other migraine without status migrainosus, not intractable  Dizziness      NEW MEDICATIONS STARTED DURING THIS VISIT:  Discharge Medication List as of 12/07/2016  5:37 AM    START taking these medications   Details  butalbital-acetaminophen-caffeine (FIORICET, ESGIC) 50-325-40 MG tablet Take 1-2 tablets by mouth every 6 (six) hours as needed for headache., Starting Tue 12/07/2016, Until Wed 12/07/2017, Print         Note:  This document was prepared using Dragon voice recognition software and may include unintentional dictation errors.    Loney Hering, MD 12/07/16 [phone]

## 2017-01-05 ENCOUNTER — Emergency Department
Admission: EM | Admit: 2017-01-05 | Discharge: 2017-01-05 | Disposition: A | Discharge status: Home / Self Care | Payer: Federal, State, Local not specified - PPO | Attending: Emergency Medicine | Admitting: Emergency Medicine

## 2017-01-05 ENCOUNTER — Emergency Department: Payer: Federal, State, Local not specified - PPO

## 2017-01-05 ENCOUNTER — Other Ambulatory Visit: Payer: Self-pay

## 2017-01-05 ENCOUNTER — Encounter: Payer: Self-pay | Admitting: *Deleted

## 2017-01-05 DIAGNOSIS — R1084 Generalized abdominal pain: Secondary | ICD-10-CM

## 2017-01-05 DIAGNOSIS — Z7984 Long term (current) use of oral hypoglycemic drugs: Secondary | ICD-10-CM | POA: Diagnosis not present

## 2017-01-05 DIAGNOSIS — E119 Type 2 diabetes mellitus without complications: Secondary | ICD-10-CM | POA: Diagnosis not present

## 2017-01-05 DIAGNOSIS — K5900 Constipation, unspecified: Secondary | ICD-10-CM | POA: Insufficient documentation

## 2017-01-05 DIAGNOSIS — Z79899 Other long term (current) drug therapy: Secondary | ICD-10-CM | POA: Diagnosis not present

## 2017-01-05 DIAGNOSIS — R110 Nausea: Secondary | ICD-10-CM

## 2017-01-05 DIAGNOSIS — R109 Unspecified abdominal pain: Secondary | ICD-10-CM | POA: Diagnosis present

## 2017-01-05 LAB — URINALYSIS, COMPLETE (UACMP) WITH MICROSCOPIC
Bacteria, UA: NONE SEEN
Bilirubin Urine: NEGATIVE
Glucose, UA: NEGATIVE mg/dL
Hgb urine dipstick: NEGATIVE
Ketones, ur: NEGATIVE mg/dL
Leukocytes, UA: NEGATIVE
Nitrite: NEGATIVE
Protein, ur: NEGATIVE mg/dL
Specific Gravity, Urine: 1.017 (ref 1.005–1.030)
pH: 6 (ref 5.0–8.0)

## 2017-01-05 LAB — CBC WITH DIFFERENTIAL/PLATELET
Basophils Absolute: 0 10*3/uL (ref 0–0.1)
Basophils Relative: 1 %
Eosinophils Absolute: 0.2 10*3/uL (ref 0–0.7)
Eosinophils Relative: 3 %
HCT: 36.6 % (ref 35.0–47.0)
Hemoglobin: 12.1 g/dL (ref 12.0–16.0)
Lymphocytes Relative: 30 %
Lymphs Abs: 1.7 10*3/uL (ref 1.0–3.6)
MCH: 26.8 pg (ref 26.0–34.0)
MCHC: 33 g/dL (ref 32.0–36.0)
MCV: 81.2 fL (ref 80.0–100.0)
Monocytes Absolute: 0.3 10*3/uL (ref 0.2–0.9)
Monocytes Relative: 4 %
Neutro Abs: 3.6 10*3/uL (ref 1.4–6.5)
Neutrophils Relative %: 62 %
Platelets: 183 10*3/uL (ref 150–440)
RBC: 4.5 MIL/uL (ref 3.80–5.20)
RDW: 14.9 % — ABNORMAL HIGH (ref 11.5–14.5)
WBC: 5.8 10*3/uL (ref 3.6–11.0)

## 2017-01-05 LAB — COMPREHENSIVE METABOLIC PANEL
ALT: 44 U/L (ref 14–54)
AST: 43 U/L — ABNORMAL HIGH (ref 15–41)
Albumin: 4.1 g/dL (ref 3.5–5.0)
Alkaline Phosphatase: 84 U/L (ref 38–126)
Anion gap: 9 (ref 5–15)
BUN: 10 mg/dL (ref 6–20)
CO2: 27 mmol/L (ref 22–32)
Calcium: 9.7 mg/dL (ref 8.9–10.3)
Chloride: 103 mmol/L (ref 101–111)
Creatinine, Ser: 0.54 mg/dL (ref 0.44–1.00)
GFR calc Af Amer: >60 mL/min (ref >60)
GFR calc non Af Amer: >60 mL/min (ref >60)
Glucose, Bld: 113 mg/dL — ABNORMAL HIGH (ref 65–99)
Potassium: 3.6 mmol/L (ref 3.5–5.1)
Sodium: 139 mmol/L (ref 135–145)
Total Bilirubin: 0.9 mg/dL (ref 0.3–1.2)
Total Protein: 7.3 g/dL (ref 6.5–8.1)

## 2017-01-05 LAB — LIPASE, BLOOD: Lipase: 20 U/L (ref 11–51)

## 2017-01-05 MED ORDER — ONDANSETRON HCL 4 MG/2ML IJ SOLN
4.0000 mg | Freq: Once | INTRAMUSCULAR | Status: AC
  Administered 2017-01-05: 4 mg via INTRAVENOUS
  Filled 2017-01-05: qty 2

## 2017-01-05 MED ORDER — KETOROLAC TROMETHAMINE 30 MG/ML IJ SOLN
15.0000 mg | Freq: Once | INTRAMUSCULAR | Status: AC
  Administered 2017-01-05: 15 mg via INTRAVENOUS
  Filled 2017-01-05: qty 1

## 2017-01-05 MED ORDER — SODIUM CHLORIDE 0.9 % IV BOLUS (SEPSIS)
1000.0000 mL | Freq: Once | INTRAVENOUS | Status: AC
  Administered 2017-01-05: 1000 mL via INTRAVENOUS

## 2017-01-05 MED ORDER — ONDANSETRON 4 MG PO TBDP: 4.0000 mg | ORAL_TABLET | Freq: Three times a day (TID) | ORAL | 0 refills | Status: DC | PRN

## 2017-01-05 NOTE — ED Provider Notes (Signed)
[facility] Emergency Department Provider Note  ____________________________________________  Time seen: Approximately 9:14 AM  I have reviewed the triage vital signs and the nursing notes.   HISTORY  Chief Complaint Constipation   HPI Morgan [name] is a 51 y.o. female with a history of bipolar and anxiety disorder who presents for evaluation of abdominal pain and constipation. Patient reports that 5 days ago she had vomiting and diarrhea. Since then she's been having small daily bowel movements (last one yesterday) but doesn't feel like she is able to fully emptying her intestines. She has been taking laxatives at home and MiraLAX. She has had ongoing nausea for the last 5 days but no longer having any vomiting. No fever or chills. Patient has had hysterectomy in the past but no prior history of SBO. She is complaining of diffuse sharp constant moderate abdominal pain for the last 5 days and also reports that she feels bloated. She is passing flatus. No dysuria or hematuria.  Past Medical History:  Diagnosis Date  . Anxiety   . Bipolar disorder ([place])   . Breast mass    right  . Cervical dysplasia   . Endometriosis   . GERD (gastroesophageal reflux disease)   . Migraine   . Pelvic kidney    left  . Postpartum depression    hx of    Patient Active Problem List   Diagnosis Date Noted  . Cervical dysplasia   . Bipolar disorder [facility])     Past Surgical History:  Procedure Laterality Date  . BACK SURGERY  2014  . BREAST LUMPECTOMY WITH RADIOACTIVE SEED LOCALIZATION Right 10/28/2014   Procedure: RIGHT BREAST LUMPECTOMY WITH RADIOACTIVE SEED LOCALIZATION;  Surgeon: [name], MD;  Location: [place];  Service: General;  Laterality: Right;  . CESAREAN SECTION  03  . COLPOSCOPY    . GYNECOLOGIC CRYOSURGERY    . NECK SURGERY  2012  . PELVIC LAPAROSCOPY  91    Prior to Admission medications   Medication Sig Start Date End Date  Taking? Authorizing Provider  ARIPiprazole (ABILIFY) 10 MG tablet Take 1 tablet by mouth daily. 08/07/14  Yes [provider]  atorvastatin (LIPITOR) 10 MG tablet Take 10 mg by mouth at bedtime. 11/04/16  Yes [provider]  desvenlafaxine (PRISTIQ) 100 MG 24 hr tablet Take 100 mg by mouth daily.   Yes [provider]  doxepin (SINEQUAN) 75 MG capsule Take 75 mg by mouth daily as needed.  05/23/14  Yes [provider]  DULoxetine (CYMBALTA) 60 MG capsule Take 60 mg by mouth at bedtime. 11/04/16  Yes [provider]  ibuprofen (ADVIL,MOTRIN) 200 MG tablet Take 200 mg by mouth every 6 (six) hours as needed.   Yes [provider]  losartan (COZAAR) 50 MG tablet Take 50 mg by mouth daily.   Yes [provider]  metFORMIN (GLUCOPHAGE) 1000 MG tablet Take 1,000 mg by mouth 2 (two) times daily. 10/27/16  Yes [provider]  omeprazole (PRILOSEC) 20 MG capsule Take 20 mg by mouth daily.   Yes [provider]  traMADol (ULTRAM) 50 MG tablet Take 50 mg by mouth every 6 (six) hours as needed for pain. 11/15/16  Yes [provider]  butalbital-acetaminophen-caffeine (FIORICET, ESGIC) 50-325-40 MG tablet Take 1-2 tablets by mouth every 6 (six) hours as needed for headache. Patient not taking: Reported on 01/05/2017 12/07/16 12/07/17  Loney Hering, MD  ibuprofen (ADVIL,MOTRIN) 800 MG tablet Take  1 tablet (800 mg total) by mouth every 8 (eight) hours as needed. Patient not taking: Reported on 01/05/2017 12/24/14   Eula Listen, MD  ondansetron (ZOFRAN ODT) 4 MG disintegrating tablet Take 1 tablet (4 mg total) by mouth every 8 (eight) hours as needed for nausea or vomiting. 01/05/17   Rudene Re, MD    Allergies Lamotrigine  Family History  Problem Relation Age of Onset  . Diabetes Mother   . Breast cancer Mother 50  . Thyroid disease Mother   . Depression Mother   . Hypertension Father   . Heart  disease Father   . Cancer Father        Throat cancer  . Diabetes Maternal Grandmother   . Cancer Maternal Grandmother        uterine  . Depression Maternal Grandmother     Social History Social History   Tobacco Use  . Smoking status: Never Smoker  . Smokeless tobacco: Never Used  Substance Use Topics  . Alcohol use: No  . Drug use: No    Review of Systems  Constitutional: Negative for fever. Eyes: Negative for visual changes. ENT: Negative for sore throat. Neck: No neck pain  Cardiovascular: Negative for chest pain. Respiratory: Negative for shortness of breath. Gastrointestinal: + diffuse abdominal pain, nausea, vomiting, diarrhea, and constipation Genitourinary: Negative for dysuria. Musculoskeletal: Negative for back pain. Skin: Negative for rash. Neurological: Negative for headaches, weakness or numbness. Psych: No SI or HI  ____________________________________________   PHYSICAL EXAM:  VITAL SIGNS: ED Triage Vitals  Enc Vitals Group     BP 01/05/17 0825 (!) 185/94     Pulse Rate 01/05/17 0825 88     Resp 01/05/17 0825 18     Temp 01/05/17 0825 98.3 F (36.8 C)     Temp Source 01/05/17 0825 Oral     SpO2 01/05/17 0825 98 %     Weight 01/05/17 0826 188 lb (85.3 kg)     Height 01/05/17 0826 5\' 1"  (1.549 m)     Head Circumference --      Peak Flow --      Pain Score 01/05/17 0825 3     Pain Loc --      Pain Edu? --      Excl. in [place]? --     Constitutional: Alert and oriented. Well appearing and in no apparent distress. HEENT:      Head: Normocephalic and atraumatic.         Eyes: Conjunctivae are normal. Sclera is non-icteric.       Mouth/Throat: Mucous membranes are moist.       Neck: Supple with no signs of meningismus. Cardiovascular: Regular rate and rhythm. No murmurs, gallops, or rubs. 2+ symmetrical distal pulses are present in all extremities. No JVD. Respiratory: Normal respiratory effort. Lungs are clear to auscultation bilaterally. No  wheezes, crackles, or rhonchi.  Gastrointestinal: Soft, mild diffuse tenderness to palpation, non distended with positive bowel sounds. No rebound or guarding. Genitourinary: No CVA tenderness. Musculoskeletal: Nontender with normal range of motion in all extremities. No edema, cyanosis, or erythema of extremities. Neurologic: Normal speech and language. Face is symmetric. Moving all extremities. No gross focal neurologic deficits are appreciated. Skin: Skin is warm, dry and intact. No rash noted. Psychiatric: Mood and affect are normal. Speech and behavior are normal.  ____________________________________________   LABS (all labs ordered are listed, but only abnormal results are displayed)  Labs Reviewed  CBC WITH DIFFERENTIAL/PLATELET - Abnormal; Notable for  the following components:      Result Value   RDW 14.9 (*)    All other components within normal limits  COMPREHENSIVE METABOLIC PANEL - Abnormal; Notable for the following components:   Glucose, Bld 113 (*)    AST 43 (*)    All other components within normal limits  URINALYSIS, COMPLETE (UACMP) WITH MICROSCOPIC - Abnormal; Notable for the following components:   Color, Urine YELLOW (*)    APPearance CLEAR (*)    Squamous Epithelial / LPF 0-5 (*)    All other components within normal limits  LIPASE, BLOOD   ____________________________________________  EKG  none  ____________________________________________  RADIOLOGY  KUB: Negative  ____________________________________________   PROCEDURES  Procedure(s) performed: None Procedures Critical Care performed:  None ____________________________________________   INITIAL IMPRESSION / ASSESSMENT AND PLAN / ED COURSE   51 y.o. female with a history of bipolar and anxiety disorder who presents for evaluation of abdominal pain and constipation. Patient initially with diarrhea and vomiting 5 days ago for 1 day. Now complaining of feeling bloated, constipation, abdominal  pain, and nausea. 2 view abdominal XR showing no evidence of obstruction, volvulus, or constipation. Abdomen is soft with positive bowel sounds, patient is passing flatus and has had no vomiting for 4 days. No clinical evidence of SBO.     _________________________ 10:34 AM on 01/05/2017 -----------------------------------------  Patient remains extremely well appearing with benign abdominal exam. KUB, CBC, CMP, lipase, and urinalysis all within normal limits. Patient reports resolution of nausea and abdominal pain with Toradol and Zofran. I have discussed return precautions with patient but I do believe patient is safe for discharge at this time.    As part of my medical decision making, I reviewed the following data within the [place] History obtained from family, Labs reviewed , Radiograph reviewed , Notes from prior ED visits and [place] Controlled Substance Database    Pertinent labs & imaging results that were available during my care of the patient were reviewed by me and considered in my medical decision making (see chart for details).    ____________________________________________   FINAL CLINICAL IMPRESSION(S) / ED DIAGNOSES  Final diagnoses:  Constipation  Generalized abdominal pain  Nausea      NEW MEDICATIONS STARTED DURING THIS VISIT:  This SmartLink is deprecated. Use AVSMEDLIST instead to display the medication list for a patient.   Note:  This document was prepared using Dragon voice recognition software and may include unintentional dictation errors.    Rudene Re, MD 01/05/17 1537

## 2017-01-05 NOTE — ED Triage Notes (Signed)
Pt complains of constipation for 2 weeks, abdominal bloating and nausea

## 2017-01-05 NOTE — ED Notes (Signed)
Pt from home with  Abdominal pain and bloating/constipation x 2 weeks. Pt states she had bm yesterday, but it was a small amount. She affirms an episode of diarrhea on Saturday. Pt reports pain across lower abdomen. Denies urinary symptoms. NAD noted.

## 2017-01-05 NOTE — Discharge Instructions (Signed)

## 2017-01-10 ENCOUNTER — Emergency Department
Admission: EM | Admit: 2017-01-10 | Discharge: 2017-01-10 | Disposition: A | Discharge status: Home / Self Care | Payer: Federal, State, Local not specified - PPO | Attending: Emergency Medicine | Admitting: Emergency Medicine

## 2017-01-10 ENCOUNTER — Encounter: Payer: Self-pay | Admitting: Intensive Care

## 2017-01-10 DIAGNOSIS — Z79899 Other long term (current) drug therapy: Secondary | ICD-10-CM | POA: Diagnosis not present

## 2017-01-10 DIAGNOSIS — R1084 Generalized abdominal pain: Secondary | ICD-10-CM

## 2017-01-10 DIAGNOSIS — Z7984 Long term (current) use of oral hypoglycemic drugs: Secondary | ICD-10-CM | POA: Insufficient documentation

## 2017-01-10 LAB — URINALYSIS, COMPLETE (UACMP) WITH MICROSCOPIC
Bacteria, UA: NONE SEEN
Bilirubin Urine: NEGATIVE
Glucose, UA: NEGATIVE mg/dL
Hgb urine dipstick: NEGATIVE
Ketones, ur: NEGATIVE mg/dL
Leukocytes, UA: NEGATIVE
Nitrite: NEGATIVE
Protein, ur: NEGATIVE mg/dL
Specific Gravity, Urine: 1.01 (ref 1.005–1.030)
pH: 6 (ref 5.0–8.0)

## 2017-01-10 LAB — CBC
HCT: 37.9 % (ref 35.0–47.0)
Hemoglobin: 12.5 g/dL (ref 12.0–16.0)
MCH: 26.7 pg (ref 26.0–34.0)
MCHC: 33 g/dL (ref 32.0–36.0)
MCV: 80.9 fL (ref 80.0–100.0)
Platelets: 213 10*3/uL (ref 150–440)
RBC: 4.69 MIL/uL (ref 3.80–5.20)
RDW: 14.9 % — ABNORMAL HIGH (ref 11.5–14.5)
WBC: 6.2 10*3/uL (ref 3.6–11.0)

## 2017-01-10 LAB — COMPREHENSIVE METABOLIC PANEL
ALT: 40 U/L (ref 14–54)
AST: 43 U/L — ABNORMAL HIGH (ref 15–41)
Albumin: 4.3 g/dL (ref 3.5–5.0)
Alkaline Phosphatase: 99 U/L (ref 38–126)
Anion gap: 9 (ref 5–15)
BUN: 9 mg/dL (ref 6–20)
CO2: 26 mmol/L (ref 22–32)
Calcium: 9.9 mg/dL (ref 8.9–10.3)
Chloride: 101 mmol/L (ref 101–111)
Creatinine, Ser: 0.61 mg/dL (ref 0.44–1.00)
GFR calc Af Amer: >60 mL/min (ref >60)
GFR calc non Af Amer: >60 mL/min (ref >60)
Glucose, Bld: 135 mg/dL — ABNORMAL HIGH (ref 65–99)
Potassium: 3.2 mmol/L — ABNORMAL LOW (ref 3.5–5.1)
Sodium: 136 mmol/L (ref 135–145)
Total Bilirubin: 0.7 mg/dL (ref 0.3–1.2)
Total Protein: 8.1 g/dL (ref 6.5–8.1)

## 2017-01-10 LAB — LIPASE, BLOOD: Lipase: 21 U/L (ref 11–51)

## 2017-01-10 MED ORDER — HYDROCODONE-ACETAMINOPHEN 5-325 MG PO TABS: 1.0000 | ORAL_TABLET | Freq: Four times a day (QID) | ORAL | 0 refills | Status: DC | PRN

## 2017-01-10 NOTE — ED Triage Notes (Signed)
Patient presents to ER with abdominal pain/bloating. Patient reports she last had a BM last night but still feels constipated. Patient was seen for same Wednesday and D/C home. Ambulatory in triage with no problems

## 2017-01-10 NOTE — ED Notes (Signed)
Pt back from X-ray.  

## 2017-01-10 NOTE — Discharge Instructions (Signed)
Fortunately today your blood work was reassuring.  Please increase the amount of fiber in your diet and follow-up with your gastroenterologist tomorrow as scheduled.  Return to the emergency department for any concerns.  It was a pleasure to take care of you today, and thank you for coming to our emergency department.  If you have any questions or concerns before leaving please ask the nurse to grab me and I'm more than happy to go through your aftercare instructions again.  If you were prescribed any opioid pain medication today such as Norco, Vicodin, Percocet, morphine, hydrocodone, or oxycodone please make sure you do not drive when you are taking this medication as it can alter your ability to drive safely.  If you have any concerns once you are home that you are not improving or are in fact getting worse before you can make it to your follow-up appointment, please do not hesitate to call 911 and come back for further evaluation.  Darel Hong, MD  Results for orders placed or performed during the hospital encounter of 01/10/17  Lipase, blood  Result Value Ref Range   Lipase 21 11 - 51 U/L  Comprehensive metabolic panel  Result Value Ref Range   Sodium 136 135 - 145 mmol/L   Potassium 3.2 (L) 3.5 - 5.1 mmol/L   Chloride 101 101 - 111 mmol/L   CO2 26 22 - 32 mmol/L   Glucose, Bld 135 (H) 65 - 99 mg/dL   BUN 9 6 - 20 mg/dL   Creatinine, Ser 0.61 0.44 - 1.00 mg/dL   Calcium 9.9 8.9 - 10.3 mg/dL   Total Protein 8.1 6.5 - 8.1 g/dL   Albumin 4.3 3.5 - 5.0 g/dL   AST 43 (H) 15 - 41 U/L   ALT 40 14 - 54 U/L   Alkaline Phosphatase 99 38 - 126 U/L   Total Bilirubin 0.7 0.3 - 1.2 mg/dL   GFR calc non Af Amer >60 >60 mL/min   GFR calc Af Amer >60 >60 mL/min   Anion gap 9 5 - 15  CBC  Result Value Ref Range   WBC 6.2 3.6 - 11.0 K/uL   RBC 4.69 3.80 - 5.20 MIL/uL   Hemoglobin 12.5 12.0 - 16.0 g/dL   HCT 37.9 35.0 - 47.0 %   MCV 80.9 80.0 - 100.0 fL   MCH 26.7 26.0 - 34.0 pg   MCHC  33.0 32.0 - 36.0 g/dL   RDW 14.9 (H) 11.5 - 14.5 %   Platelets 213 150 - 440 K/uL  Urinalysis, Complete w Microscopic  Result Value Ref Range   Color, Urine YELLOW (A) YELLOW   APPearance CLEAR (A) CLEAR   Specific Gravity, Urine 1.010 1.005 - 1.030   pH 6.0 5.0 - 8.0   Glucose, UA NEGATIVE NEGATIVE mg/dL   Hgb urine dipstick NEGATIVE NEGATIVE   Bilirubin Urine NEGATIVE NEGATIVE   Ketones, ur NEGATIVE NEGATIVE mg/dL   Protein, ur NEGATIVE NEGATIVE mg/dL   Nitrite NEGATIVE NEGATIVE   Leukocytes, UA NEGATIVE NEGATIVE   RBC / HPF 0-5 0 - 5 RBC/hpf   WBC, UA 0-5 0 - 5 WBC/hpf   Bacteria, UA NONE SEEN NONE SEEN   Squamous Epithelial / LPF 0-5 (A) NONE SEEN   Mucus PRESENT    Dg Abd 2 Views  Result Date: 01/05/2017 CLINICAL DATA:  Constipation. EXAM: ABDOMEN - 2 VIEW COMPARISON:  CT 12/24/2014 FINDINGS: Changes of posterior fusion in the lower lumbar spine. Nonobstructive bowel gas pattern.  No significant increase in stool burden. No organomegaly or free air. No suspicious calcification. IMPRESSION: No acute findings. Electronically Signed   By: Rolm Baptise M.D.   On: 01/05/2017 09:18

## 2017-01-10 NOTE — ED Notes (Signed)
Patient transported to X-ray 

## 2017-01-10 NOTE — ED Provider Notes (Signed)
[facility] Emergency Department Provider Note  ____________________________________________   First MD Initiated Contact with Patient 01/10/17 1153     (approximate)  I have reviewed the triage vital signs and the nursing notes.   HISTORY  Chief Complaint Abdominal Pain    HPI Morgan [name] is a 51 y.o. female who self presents the emergency department with roughly 3 weeks of diffuse abdominal bloating and pain. Pain is mild in severity and nonradiating. Nothing seems to make it better or worse. She does feel constipated. She was seen in our emergency department 5 days ago for similar symptoms was discharged home. She has follow-up with gastroenterology tomorrow, however wanted to come in to get rechecked prior to the appointment. She denies fevers or chills.   Past Medical History:  Diagnosis Date  . Anxiety   . Bipolar disorder ([place])   . Breast mass    right  . Cervical dysplasia   . Endometriosis   . GERD (gastroesophageal reflux disease)   . Migraine   . Pelvic kidney    left  . Postpartum depression    hx of    Patient Active Problem List   Diagnosis Date Noted  . Cervical dysplasia   . Bipolar disorder [facility])     Past Surgical History:  Procedure Laterality Date  . BACK SURGERY  2014  . BREAST LUMPECTOMY WITH RADIOACTIVE SEED LOCALIZATION Right 10/28/2014   Procedure: RIGHT BREAST LUMPECTOMY WITH RADIOACTIVE SEED LOCALIZATION;  Surgeon: [name], MD;  Location: [place];  Service: General;  Laterality: Right;  . CESAREAN SECTION  03  . COLPOSCOPY    . GYNECOLOGIC CRYOSURGERY    . NECK SURGERY  2012  . PELVIC LAPAROSCOPY  91    Prior to Admission medications   Medication Sig Start Date End Date Taking? Authorizing Provider  ARIPiprazole (ABILIFY) 10 MG tablet Take 1 tablet by mouth daily. 08/07/14   [provider]  atorvastatin (LIPITOR) 10 MG tablet Take 10 mg by mouth at bedtime. 11/04/16    [provider]  butalbital-acetaminophen-caffeine (FIORICET, ESGIC) 50-325-40 MG tablet Take 1-2 tablets by mouth every 6 (six) hours as needed for headache. Patient not taking: Reported on 01/05/2017 12/07/16 12/07/17  Loney Hering, MD  desvenlafaxine (PRISTIQ) 100 MG 24 hr tablet Take 100 mg by mouth daily.    [provider]  doxepin (SINEQUAN) 75 MG capsule Take 75 mg by mouth daily as needed.  05/23/14   [provider]  DULoxetine (CYMBALTA) 60 MG capsule Take 60 mg by mouth at bedtime. 11/04/16   [provider]  HYDROcodone-acetaminophen (NORCO) 5-325 MG tablet Take 1 tablet by mouth every 6 (six) hours as needed for up to 5 doses for severe pain. 01/10/17   Darel Hong, MD  ibuprofen (ADVIL,MOTRIN) 200 MG tablet Take 200 mg by mouth every 6 (six) hours as needed.    [provider]  ibuprofen (ADVIL,MOTRIN) 800 MG tablet Take 1 tablet (800 mg total) by mouth every 8 (eight) hours as needed. Patient not taking: Reported on 01/05/2017 12/24/14   Eula Listen, MD  losartan (COZAAR) 50 MG tablet Take 50 mg by mouth daily.    [provider]  metFORMIN (GLUCOPHAGE) 1000 MG tablet Take 1,000 mg by mouth 2 (two) times daily. 10/27/16   [provider]  omeprazole (PRILOSEC) 20 MG capsule Take 20 mg by mouth daily.    [provider]  ondansetron (ZOFRAN ODT) 4 MG disintegrating  tablet Take 1 tablet (4 mg total) by mouth every 8 (eight) hours as needed for nausea or vomiting. 01/05/17   Rudene Re, MD  traMADol (ULTRAM) 50 MG tablet Take 50 mg by mouth every 6 (six) hours as needed for pain. 11/15/16   [provider]    Allergies Lamotrigine  Family History  Problem Relation Age of Onset  . Diabetes Mother   . Breast cancer Mother 37  . Thyroid disease Mother   . Depression Mother   . Hypertension Father   . Heart disease Father   . Cancer Father        Throat cancer  . Diabetes  Maternal Grandmother   . Cancer Maternal Grandmother        uterine  . Depression Maternal Grandmother     Social History Social History   Tobacco Use  . Smoking status: Never Smoker  . Smokeless tobacco: Never Used  Substance Use Topics  . Alcohol use: No  . Drug use: No    Review of Systems Constitutional: No fever/chills Eyes: No visual changes. ENT: No sore throat. Cardiovascular: Denies chest pain. Respiratory: Denies shortness of breath. Gastrointestinal: Positive for abdominal pain.  Positive for nausea, no vomiting.  No diarrhea.  Positive for constipation. Genitourinary: Negative for dysuria. Musculoskeletal: Negative for back pain. Skin: Negative for rash. Neurological: Negative for headaches, focal weakness or numbness.   ____________________________________________   PHYSICAL EXAM:  VITAL SIGNS: ED Triage Vitals [01/10/17 1104]  Enc Vitals Group     BP (!) 169/98     Pulse Rate 87     Resp 16     Temp 98.4 F (36.9 C)     Temp Source Oral     SpO2 100 %     Weight 188 lb (85.3 kg)     Height 5\' 1"  (1.549 m)     Head Circumference      Peak Flow      Pain Score 6     Pain Loc      Pain Edu?      Excl. in [place]?     Constitutional: Alert and oriented 4 pleasant cooperative speaks in full clear sentences no diaphoresis Eyes: PERRL EOMI. Head: Atraumatic. Nose: No congestion/rhinnorhea. Mouth/Throat: No trismus Neck: No stridor.   Cardiovascular: Normal rate, regular rhythm. Grossly normal heart sounds.  Good peripheral circulation. Respiratory: Normal respiratory effort.  No retractions. Lungs CTAB and moving good air Gastrointestinal: Soft nondistended nontender no rebound or guarding no peritonitis no McBurney's tenderness negative Rovsing's no costovertebral tenderness Musculoskeletal: No lower extremity edema   Neurologic:  Normal speech and language. No gross focal neurologic deficits are appreciated. Skin:  Skin is warm, dry and intact.  No rash noted. Psychiatric: Mood and affect are normal. Speech and behavior are normal.    ____________________________________________   DIFFERENTIAL includes but not limited to  Biliary colic, gastritis, gastric reflux, cholangitis, cholecystitis, urinary tract infection, functional abdominal ____________________________________________   LABS (all labs ordered are listed, but only abnormal results are displayed)  Labs Reviewed  COMPREHENSIVE METABOLIC PANEL - Abnormal; Notable for the following components:      Result Value   Potassium 3.2 (*)    Glucose, Bld 135 (*)    AST 43 (*)    All other components within normal limits  CBC - Abnormal; Notable for the following components:   RDW 14.9 (*)    All other components within normal limits  URINALYSIS, COMPLETE (UACMP) WITH MICROSCOPIC - Abnormal;  Notable for the following components:   Color, Urine YELLOW (*)    APPearance CLEAR (*)    Squamous Epithelial / LPF 0-5 (*)    All other components within normal limits  LIPASE, BLOOD    Lab work reviewed by me with no acute disease noted __________________________________________  EKG   ____________________________________________  RADIOLOGY   ____________________________________________   PROCEDURES  Procedure(s) performed: no  Procedures  Critical Care performed: no  Observation: no ____________________________________________   INITIAL IMPRESSION / ASSESSMENT AND PLAN / ED COURSE  Pertinent labs & imaging results that were available during my care of the patient were reviewed by me and considered in my medical decision making (see chart for details).  The patient is very well-appearing and hemodynamically stable with a benign abdominal exam. I discussed the results with the patient as well as results from several days ago. I've encouraged her to keep her appointment with gastroenterology tomorrow as scheduled. Strict return precautions given and the  patient verbalizes understanding and agreement with the plan.      ____________________________________________   FINAL CLINICAL IMPRESSION(S) / ED DIAGNOSES  Final diagnoses:  Generalized abdominal pain      NEW MEDICATIONS STARTED DURING THIS VISIT:  This SmartLink is deprecated. Use AVSMEDLIST instead to display the medication list for a patient.   Note:  This document was prepared using Dragon voice recognition software and may include unintentional dictation errors.     Darel Hong, MD 01/12/17 1606

## 2017-01-11 ENCOUNTER — Other Ambulatory Visit: Payer: Self-pay | Admitting: Gastroenterology

## 2017-01-11 DIAGNOSIS — R1030 Lower abdominal pain, unspecified: Secondary | ICD-10-CM

## 2017-01-17 ENCOUNTER — Ambulatory Visit
Admission: RE | Admit: 2017-01-17 | Discharge: 2017-01-17 | Disposition: A | Discharge status: Home / Self Care | Payer: Federal, State, Local not specified - PPO | Source: Ambulatory Visit | Attending: Gastroenterology | Admitting: Gastroenterology

## 2017-01-17 DIAGNOSIS — R1030 Lower abdominal pain, unspecified: Secondary | ICD-10-CM

## 2017-01-17 MED ORDER — IOPAMIDOL (ISOVUE-300) INJECTION 61%
100.0000 mL | Freq: Once | INTRAVENOUS | Status: AC | PRN
  Administered 2017-01-17: 100 mL via INTRAVENOUS

## 2017-06-14 ENCOUNTER — Emergency Department (HOSPITAL_COMMUNITY): Payer: Federal, State, Local not specified - PPO

## 2017-06-14 ENCOUNTER — Encounter (HOSPITAL_COMMUNITY): Payer: Self-pay | Admitting: Emergency Medicine

## 2017-06-14 ENCOUNTER — Other Ambulatory Visit: Payer: Self-pay

## 2017-06-14 ENCOUNTER — Ambulatory Visit (HOSPITAL_BASED_OUTPATIENT_CLINIC_OR_DEPARTMENT_OTHER): Payer: Federal, State, Local not specified - PPO

## 2017-06-14 ENCOUNTER — Observation Stay (HOSPITAL_COMMUNITY)
Admission: EM | Admit: 2017-06-14 | Discharge: 2017-06-16 | Disposition: A | Discharge status: Home / Self Care | Payer: Federal, State, Local not specified - PPO | Attending: Family Medicine | Admitting: Family Medicine

## 2017-06-14 DIAGNOSIS — M79602 Pain in left arm: Secondary | ICD-10-CM | POA: Insufficient documentation

## 2017-06-14 DIAGNOSIS — Z7984 Long term (current) use of oral hypoglycemic drugs: Secondary | ICD-10-CM | POA: Diagnosis not present

## 2017-06-14 DIAGNOSIS — R072 Precordial pain: Secondary | ICD-10-CM | POA: Diagnosis not present

## 2017-06-14 DIAGNOSIS — I10 Essential (primary) hypertension: Secondary | ICD-10-CM | POA: Diagnosis not present

## 2017-06-14 DIAGNOSIS — F319 Bipolar disorder, unspecified: Secondary | ICD-10-CM | POA: Diagnosis present

## 2017-06-14 DIAGNOSIS — F419 Anxiety disorder, unspecified: Secondary | ICD-10-CM | POA: Insufficient documentation

## 2017-06-14 DIAGNOSIS — E1169 Type 2 diabetes mellitus with other specified complication: Secondary | ICD-10-CM | POA: Diagnosis present

## 2017-06-14 DIAGNOSIS — R079 Chest pain, unspecified: Secondary | ICD-10-CM | POA: Diagnosis present

## 2017-06-14 DIAGNOSIS — M79609 Pain in unspecified limb: Secondary | ICD-10-CM

## 2017-06-14 DIAGNOSIS — E1165 Type 2 diabetes mellitus with hyperglycemia: Secondary | ICD-10-CM | POA: Diagnosis not present

## 2017-06-14 DIAGNOSIS — E785 Hyperlipidemia, unspecified: Secondary | ICD-10-CM | POA: Diagnosis not present

## 2017-06-14 DIAGNOSIS — Z7982 Long term (current) use of aspirin: Secondary | ICD-10-CM | POA: Insufficient documentation

## 2017-06-14 DIAGNOSIS — I152 Hypertension secondary to endocrine disorders: Secondary | ICD-10-CM | POA: Diagnosis present

## 2017-06-14 DIAGNOSIS — E1159 Type 2 diabetes mellitus with other circulatory complications: Secondary | ICD-10-CM | POA: Diagnosis present

## 2017-06-14 DIAGNOSIS — Z79899 Other long term (current) drug therapy: Secondary | ICD-10-CM | POA: Insufficient documentation

## 2017-06-14 HISTORY — DX: Type 2 diabetes mellitus without complications: E11.9

## 2017-06-14 HISTORY — DX: Essential (primary) hypertension: I10

## 2017-06-14 HISTORY — DX: Pure hypercholesterolemia, unspecified: E78.00

## 2017-06-14 LAB — BASIC METABOLIC PANEL
Anion gap: 6 (ref 5–15)
BUN: 6 mg/dL (ref 6–20)
CO2: 27 mmol/L (ref 22–32)
Calcium: 9.9 mg/dL (ref 8.9–10.3)
Chloride: 106 mmol/L (ref 101–111)
Creatinine, Ser: 0.66 mg/dL (ref 0.44–1.00)
GFR calc Af Amer: >60 mL/min (ref >60)
GFR calc non Af Amer: >60 mL/min (ref >60)
Glucose, Bld: 167 mg/dL — ABNORMAL HIGH (ref 65–99)
Potassium: 4 mmol/L (ref 3.5–5.1)
Sodium: 139 mmol/L (ref 135–145)

## 2017-06-14 LAB — CREATININE, SERUM
Creatinine, Ser: 0.66 mg/dL (ref 0.44–1.00)
GFR calc Af Amer: >60 mL/min (ref >60)
GFR calc non Af Amer: >60 mL/min (ref >60)

## 2017-06-14 LAB — I-STAT TROPONIN, ED
Troponin i, poc: 0 ng/mL (ref 0.00–0.08)
Troponin i, poc: 0 ng/mL (ref 0.00–0.08)

## 2017-06-14 LAB — CBC
HCT: 36.2 % (ref 36.0–46.0)
HCT: 35.8 % — ABNORMAL LOW (ref 36.0–46.0)
Hemoglobin: 11.6 g/dL — ABNORMAL LOW (ref 12.0–15.0)
Hemoglobin: 11.7 g/dL — ABNORMAL LOW (ref 12.0–15.0)
MCH: 26.1 pg (ref 26.0–34.0)
MCH: 26.6 pg (ref 26.0–34.0)
MCHC: 32 g/dL (ref 30.0–36.0)
MCHC: 32.7 g/dL (ref 30.0–36.0)
MCV: 81.5 fL (ref 78.0–100.0)
MCV: 81.4 fL (ref 78.0–100.0)
Platelets: 182 10*3/uL (ref 150–400)
Platelets: 187 10*3/uL (ref 150–400)
RBC: 4.44 MIL/uL (ref 3.87–5.11)
RBC: 4.4 MIL/uL (ref 3.87–5.11)
RDW: 14.8 % (ref 11.5–15.5)
RDW: 14.7 % (ref 11.5–15.5)
WBC: 5.2 10*3/uL (ref 4.0–10.5)
WBC: 6.1 10*3/uL (ref 4.0–10.5)

## 2017-06-14 LAB — I-STAT BETA HCG BLOOD, ED (MC, WL, AP ONLY): I-stat hCG, quantitative: <5 m[IU]/mL (ref <5)

## 2017-06-14 LAB — D-DIMER, QUANTITATIVE (NOT AT ARMC): D-Dimer, Quant: <0.27 ug/mL-FEU (ref 0.00–0.50)

## 2017-06-14 LAB — TROPONIN I: Troponin I: <0.03 ng/mL (ref <0.03)

## 2017-06-14 MED ORDER — LOSARTAN POTASSIUM 50 MG PO TABS
50.0000 mg | ORAL_TABLET | Freq: Every day | ORAL | Status: DC
Start: 2017-06-14 — End: 2017-06-15
  Administered 2017-06-14: 50 mg via ORAL
  Filled 2017-06-14: qty 1

## 2017-06-14 MED ORDER — NITROGLYCERIN 0.4 MG SL SUBL: 0.4000 mg | SUBLINGUAL_TABLET | SUBLINGUAL | Status: DC | PRN

## 2017-06-14 MED ORDER — TRAMADOL HCL 50 MG PO TABS: 50.0000 mg | ORAL_TABLET | Freq: Four times a day (QID) | ORAL | Status: DC | PRN

## 2017-06-14 MED ORDER — ENOXAPARIN SODIUM 40 MG/0.4ML ~~LOC~~ SOLN
40.0000 mg | Freq: Every day | SUBCUTANEOUS | Status: DC
  Administered 2017-06-14 – 2017-06-15 (×2): 40 mg via SUBCUTANEOUS
  Filled 2017-06-14 (×2): qty 0.4

## 2017-06-14 MED ORDER — ASPIRIN EC 325 MG PO TBEC
325.0000 mg | DELAYED_RELEASE_TABLET | Freq: Every day | ORAL | Status: DC
  Administered 2017-06-15: 325 mg via ORAL
  Filled 2017-06-14: qty 1

## 2017-06-14 MED ORDER — TEMAZEPAM 15 MG PO CAPS
15.0000 mg | ORAL_CAPSULE | Freq: Every day | ORAL | Status: DC
  Administered 2017-06-15: 15 mg via ORAL
  Filled 2017-06-14: qty 1

## 2017-06-14 MED ORDER — DULOXETINE HCL 60 MG PO CPEP
60.0000 mg | ORAL_CAPSULE | Freq: Every day | ORAL | Status: DC
  Administered 2017-06-14 – 2017-06-15 (×2): 60 mg via ORAL
  Filled 2017-06-14 (×2): qty 1

## 2017-06-14 MED ORDER — ACETAMINOPHEN 325 MG PO TABS
650.0000 mg | ORAL_TABLET | ORAL | Status: DC | PRN
  Filled 2017-06-14: qty 2

## 2017-06-14 MED ORDER — ARIPIPRAZOLE 5 MG PO TABS
10.0000 mg | ORAL_TABLET | Freq: Every day | ORAL | Status: DC
  Administered 2017-06-14 – 2017-06-15 (×2): 10 mg via ORAL
  Filled 2017-06-14 (×2): qty 2

## 2017-06-14 MED ORDER — ATORVASTATIN CALCIUM 10 MG PO TABS
10.0000 mg | ORAL_TABLET | Freq: Every day | ORAL | Status: DC
  Administered 2017-06-14 – 2017-06-15 (×2): 10 mg via ORAL
  Filled 2017-06-14 (×2): qty 1

## 2017-06-14 MED ORDER — INSULIN ASPART 100 UNIT/ML ~~LOC~~ SOLN
0.0000 [IU] | Freq: Three times a day (TID) | SUBCUTANEOUS | Status: DC
  Administered 2017-06-15 (×2): 3 [IU] via SUBCUTANEOUS
  Administered 2017-06-16: 2 [IU] via SUBCUTANEOUS

## 2017-06-14 MED ORDER — ASPIRIN 81 MG PO CHEW
81.0000 mg | CHEWABLE_TABLET | Freq: Once | ORAL | Status: AC
  Administered 2017-06-14: 81 mg via ORAL
  Filled 2017-06-14: qty 1

## 2017-06-14 MED ORDER — IBUPROFEN 400 MG PO TABS
600.0000 mg | ORAL_TABLET | Freq: Once | ORAL | Status: AC
  Administered 2017-06-14: 19:00:00 600 mg via ORAL
  Filled 2017-06-14: qty 1

## 2017-06-14 MED ORDER — PANTOPRAZOLE SODIUM 40 MG PO TBEC
40.0000 mg | DELAYED_RELEASE_TABLET | Freq: Every day | ORAL | Status: DC
  Administered 2017-06-14 – 2017-06-16 (×3): 40 mg via ORAL
  Filled 2017-06-14 (×3): qty 1

## 2017-06-14 MED ORDER — ASPIRIN 81 MG PO CHEW
243.0000 mg | CHEWABLE_TABLET | Freq: Once | ORAL | Status: AC
  Administered 2017-06-14: 243 mg via ORAL
  Filled 2017-06-14: qty 3

## 2017-06-14 MED ORDER — MORPHINE SULFATE (PF) 4 MG/ML IV SOLN: 2.0000 mg | INTRAVENOUS | Status: DC | PRN

## 2017-06-14 MED ORDER — ASPIRIN 81 MG PO CHEW: 324.0000 mg | CHEWABLE_TABLET | Freq: Once | ORAL | Status: DC

## 2017-06-14 MED ORDER — ONDANSETRON HCL 4 MG/2ML IJ SOLN: 4.0000 mg | Freq: Four times a day (QID) | INTRAMUSCULAR | Status: DC | PRN

## 2017-06-14 MED ORDER — ASPIRIN EC 81 MG PO TBEC: 81.0000 mg | DELAYED_RELEASE_TABLET | Freq: Every day | ORAL | 0 refills | Status: DC

## 2017-06-14 MED ORDER — DICYCLOMINE HCL 10 MG PO CAPS
10.0000 mg | ORAL_CAPSULE | Freq: Two times a day (BID) | ORAL | Status: DC
  Administered 2017-06-14 – 2017-06-15 (×2): 10 mg via ORAL
  Filled 2017-06-14 (×3): qty 1

## 2017-06-14 MED ORDER — HYDROCODONE-ACETAMINOPHEN 5-325 MG PO TABS
1.0000 | ORAL_TABLET | Freq: Once | ORAL | Status: AC
  Administered 2017-06-14: 1 via ORAL
  Filled 2017-06-14: qty 1

## 2017-06-14 NOTE — H&P (Signed)
History and Physical    Morgan [name] DDU:[id number] DOB: [birth year] DOA: 06/14/2017  PCP: London Pepper, MD Patient coming from: Home  I have personally briefly reviewed patient's old medical records in Morgan [name]  Chief Complaint: Chest pain and left arm radiating to the back and [name] of her chest  HPI: Morgan [name] is a 52 y.o. female with medical history significant of diabetes, hypertension, hypercholesterolemia with a family history of her father having an MI at 70 years old who presents the emergency department with a complaint of left arm pain and left chest pain.  Pain has been present since Sunday it is intermittent in her chest and constant in her arm.  The pain is described as a pressure and sharpness.  She also states that it is a stabbing pain that radiates to the [name] of her chest and in her chest the pain is dull.  It with exertional dyspnea it improves with rest it is present at the current time and did not get any relief with treatments in the emergency department.  ED Course: D-dimer was negative labs were reviewed and acceptable.  She was given a Norco, ibuprofen, and aspirin did not have any relief of her discomfort.  Her EKG was unremarkable her troponin was negative.  She will be observed overnight in the hospital for further evaluation and management.  Review of Systems: As per HPI otherwise all other systems reviewed and  negative.    Past Medical History:  Diagnosis Date  . Anxiety   . Bipolar disorder ([place])   . Breast mass    right  . Cervical dysplasia   . Diabetes mellitus without complication ([place])   . Endometriosis   . GERD (gastroesophageal reflux disease)   . Hypercholesteremia   . Hypertension   . Migraine   . Pelvic kidney    left  . Postpartum depression    hx of    Past Surgical History:  Procedure Laterality Date  . BACK SURGERY  2014  . BREAST LUMPECTOMY WITH RADIOACTIVE SEED LOCALIZATION Right 10/28/2014   Procedure:  RIGHT BREAST LUMPECTOMY WITH RADIOACTIVE SEED LOCALIZATION;  Surgeon: [name], MD;  Location: [place];  Service: General;  Laterality: Right;  . CESAREAN SECTION  03  . COLPOSCOPY    . GYNECOLOGIC CRYOSURGERY    . NECK SURGERY  2012  . PELVIC LAPAROSCOPY  91     reports that she has never smoked. She has never used smokeless tobacco. She reports that she does not drink alcohol or use drugs.  Allergies  Allergen Reactions  . Lamotrigine Rash    Family History  Problem Relation Age of Onset  . Diabetes Mother   . Breast cancer Mother 53  . Thyroid disease Mother   . Depression Mother   . Hypertension Father   . Heart disease Father   . Cancer Father        Throat cancer  . Diabetes Maternal Grandmother   . Cancer Maternal Grandmother        uterine  . Depression Maternal Grandmother      Prior to Admission medications   Medication Sig Start Date End Date Taking? Authorizing Provider  ARIPiprazole (ABILIFY) 10 MG tablet Take 1 tablet by mouth at bedtime.  08/07/14  Yes [provider]  atorvastatin (LIPITOR) 10 MG tablet Take 10 mg by mouth at bedtime. 11/04/16  Yes [provider]  dicyclomine (BENTYL) 10 MG capsule Take 10 mg by  mouth 2 (two) times daily. 04/25/17  Yes [provider]  DULoxetine (CYMBALTA) 60 MG capsule Take 60 mg by mouth at bedtime. 11/04/16  Yes [provider]  estazolam (PROSOM) 2 MG tablet Take 2 mg by mouth as needed. 04/13/17  Yes [provider]  losartan (COZAAR) 50 MG tablet Take 50 mg by mouth at bedtime.    Yes [provider]  metFORMIN (GLUCOPHAGE) 1000 MG tablet Take 1,000 mg by mouth 2 (two) times daily. 10/27/16  Yes [provider]  omeprazole (PRILOSEC) 20 MG capsule Take 20 mg by mouth at bedtime.    Yes [provider]  traMADol (ULTRAM) 50 MG tablet Take 50 mg by mouth every 6 (six) hours as needed for pain. 11/15/16  Yes [provider]    aspirin EC 81 MG tablet Take 1 tablet (81 mg total) by mouth daily. 06/14/17   Joy, Shawn C, PA-[name]  butalbital-acetaminophen-caffeine (FIORICET, ESGIC) 50-325-40 MG tablet Take 1-2 tablets by mouth every 6 (six) hours as needed for headache. Patient not taking: Reported on 01/05/2017 12/07/16 12/07/17  Loney Hering, MD  HYDROcodone-acetaminophen [facility]) 5-325 MG tablet Take 1 tablet by mouth every 6 (six) hours as needed for up to 5 doses for severe pain. Patient not taking: Reported on 06/14/2017 01/10/17   Morgan Hong, MD  ibuprofen (ADVIL,MOTRIN) 800 MG tablet Take 1 tablet (800 mg total) by mouth every 8 (eight) hours as needed. Patient not taking: Reported on 01/05/2017 12/24/14   Morgan Listen, MD  ondansetron (ZOFRAN ODT) 4 MG disintegrating tablet Take 1 tablet (4 mg total) by mouth every 8 (eight) hours as needed for nausea or vomiting. Patient not taking: Reported on 06/14/2017 01/05/17   Rudene Re, MD    Physical Exam:  Constitutional: NAD, calm, comfortable Vitals:   06/14/17 1830 06/14/17 2015 06/14/17 2030 06/14/17 2051  BP: 126/87 108/79 132/80 (!) 144/83  Pulse: 91 97 89 84  Resp: (!) 22 16 16  (!) 21  Temp:    98.2 F (36.8 [name])  TempSrc:    Oral  SpO2: 98% 100% 99% 98%  Weight:      Height:       Eyes: PERRL, lids and conjunctivae normal ENMT: Mucous membranes are moist. Posterior pharynx clear of any exudate or lesions.Normal dentition.  Neck: normal, supple, no masses, no thyromegaly Respiratory: clear to auscultation bilaterally, no wheezing, no crackles. Normal respiratory effort. No accessory muscle use.  Cardiovascular: Regular rate and rhythm, no murmurs / rubs / gallops. No extremity edema. 2+ pedal pulses. No carotid bruits.  Abdomen: no tenderness, no masses palpated. No hepatosplenomegaly. Bowel sounds positive.  Musculoskeletal: no clubbing / cyanosis. No joint deformity upper and lower extremities. Good ROM, no contractures. Normal  muscle tone.  Skin: no rashes, lesions, ulcers. No induration Neurologic: CN 2-12 grossly intact. Sensation intact, DTR normal. Strength 5/5 in all 4.  Psychiatric: Normal judgment and insight. Alert and oriented x 3.  Anxious mood    Labs on Admission: I have personally reviewed following labs and imaging studies  CBC: Recent Labs  Lab 06/14/17 1241  WBC 5.2  HGB 11.6*  HCT 36.2  MCV 81.5  PLT 161   Basic Metabolic Panel: Recent Labs  Lab 06/14/17 1241  NA 139  K 4.0  CL 106  CO2 27  GLUCOSE 167*  BUN 6  CREATININE 0.66  CALCIUM 9.9   Urine analysis:    Component Value Date/Time   COLORURINE YELLOW (A) 01/10/2017 1108  APPEARANCEUR CLEAR (A) 01/10/2017 1108   APPEARANCEUR Hazy 05/31/2014 1356   LABSPEC 1.010 01/10/2017 1108   LABSPEC 1.011 05/31/2014 1356   PHURINE 6.0 01/10/2017 1108   GLUCOSEU NEGATIVE 01/10/2017 1108   GLUCOSEU Negative 05/31/2014 1356   HGBUR NEGATIVE 01/10/2017 1108   BILIRUBINUR NEGATIVE 01/10/2017 1108   BILIRUBINUR Negative 05/31/2014 [place] 01/10/2017 1108   PROTEINUR NEGATIVE 01/10/2017 1108   UROBILINOGEN 0.2 12/01/2012 1539   NITRITE NEGATIVE 01/10/2017 1108   LEUKOCYTESUR NEGATIVE 01/10/2017 1108   LEUKOCYTESUR Trace 05/31/2014 1356    Radiological Exams on Admission: Dg Chest 2 View  Result Date: 06/14/2017 CLINICAL DATA:  Chest pain and shortness of breath EXAM: CHEST - 2 VIEW COMPARISON:  April 20, 2015 FINDINGS: There is no edema or consolidation. The heart size and pulmonary vascularity are normal. No adenopathy. There is postoperative change in the lower cervical region. No pneumothorax. IMPRESSION: No edema or consolidation. Electronically Signed   By: Lowella Grip III M.D.   On: 06/14/2017 13:00    EKG: Independently reviewed.  Normal sinus rhythm with normal axes and intervals when compared to December 06, 2016 there is no change.  Assessment/Plan Principal Problem:   Chest pain Active  Problems:   Bipolar disorder ([place])   Hypertension complicating diabetes ([place])   Type 2 diabetes mellitus with hyperlipidemia ([place])   Hyperlipidemia associated with type 2 diabetes mellitus ([place])   1.  Chest pain: Chest pain does not appear typical his pain is been going on for several days now and her troponins are normal.  However she has risk factors and a family history of may have some atypical sensation given her diabetes.  Therefore we will admit her and her observation services and rule out myocardial infarction with serial enzymes and EKGs.  If enzymes are abnormal we will consult cardiology for possible consideration of cardiac catheterization.  If not we will consider stress testing in a.m.  Patient with normal stress test would recommend outpatient evaluation for possible GI causes of chest pain.  2.  Bipolar disorder with anxiety: Patient with clear anxiety on examination she is very nervous she seems to be anxious as well.  I did reassure her that I did not think that this was cardiac in nature but that if it were we would be able to take care of it.  We will continue her home medications.  3.  Hypertension complicating diabetes: Continue home medication management.  4.  Hyperlipidemia associated with type 2 diabetes mellitus: Continue home medication management.  DVT prophylaxis: Lovenox Code Status: Full code Family Communication: No family present at the time patient alert awake and oriented and retains capacity. Disposition Plan: Likely home tomorrow if stress test negative Consults called: None Admission status: Observation   Lady Deutscher MD FACP [facility] Pager [phone]  If 7PM-7AM, please contact night-coverage [email or web address] Password [name]  06/14/2017, 9:32 PM

## 2017-06-14 NOTE — ED Provider Notes (Addendum)
[facility] EMERGENCY DEPARTMENT Provider Note   CSN: [id number] Arrival date & time: 06/14/17  1228     History   Chief Complaint Chief Complaint  Patient presents with  . Chest Pain    HPI Morgan [name] is a 52 y.o. female.  HPI   Morgan [name] is a 53 y.o. female, with a history of DM, HTN, and hypercholesterolemia, presenting to the ED with chest pain beginning two days ago.  Left chest, was intermittent arising at rest, last for approximately 20 minutes at a time, constant today, sharp in the left chest, pressure in central chest, 5/10, nonradiating. Complains of left arm pain beginning four days ago, constant, sharp, 7/10, radiating into fingertips.  Has not had this issue before. Accompanied by nausea, lightheadedness, and exertional shortness of breath. Diarrhea yesterday, none today.  Denies cough, fever/chills, vomiting, hematochezia/melena, diaphoresis, lower extremity edema, abdominal pain, or any other complaints.  No history of PE/DVT, recent surgeries, trauma, immobilization, hormone replacement.     Past Medical History:  Diagnosis Date  . Anxiety   . Bipolar disorder ([place])   . Breast mass    right  . Cervical dysplasia   . Diabetes mellitus without complication ([place])   . Endometriosis   . GERD (gastroesophageal reflux disease)   . Hypercholesteremia   . Hypertension   . Migraine   . Pelvic kidney    left  . Postpartum depression    hx of    Patient Active Problem List   Diagnosis Date Noted  . Cervical dysplasia   . Bipolar disorder [facility])     Past Surgical History:  Procedure Laterality Date  . BACK SURGERY  2014  . BREAST LUMPECTOMY WITH RADIOACTIVE SEED LOCALIZATION Right 10/28/2014   Procedure: RIGHT BREAST LUMPECTOMY WITH RADIOACTIVE SEED LOCALIZATION;  Surgeon: [name], MD;  Location: [facility];  Service: General;  Laterality: Right;  . CESAREAN SECTION  03  . COLPOSCOPY    . GYNECOLOGIC  CRYOSURGERY    . NECK SURGERY  2012  . PELVIC LAPAROSCOPY  53     OB History    Gravida  1   Para  1   Term  1   Preterm      AB      Living  1     SAB      TAB      Ectopic      Multiple      Live Births               Home Medications    Prior to Admission medications   Medication Sig Start Date End Date Taking? Authorizing Provider  ARIPiprazole (ABILIFY) 10 MG tablet Take 1 tablet by mouth at bedtime.  08/07/14  Yes [provider]  atorvastatin (LIPITOR) 10 MG tablet Take 10 mg by mouth at bedtime. 11/04/16  Yes [provider]  dicyclomine (BENTYL) 10 MG capsule Take 10 mg by mouth 2 (two) times daily. 04/25/17  Yes [provider]  DULoxetine (CYMBALTA) 60 MG capsule Take 60 mg by mouth at bedtime. 11/04/16  Yes [provider]  estazolam (PROSOM) 2 MG tablet Take 2 mg by mouth as needed. 04/13/17  Yes [provider]  losartan (COZAAR) 50 MG tablet Take 50 mg by mouth at bedtime.    Yes [provider]  metFORMIN (GLUCOPHAGE) 1000 MG tablet Take 1,000 mg by mouth 2 (two) times daily. 10/27/16  Yes [provider]  omeprazole (PRILOSEC) 20 MG capsule Take 20 mg by mouth at bedtime.    Yes [provider]  traMADol (ULTRAM) 50 MG tablet Take 50 mg by mouth every 6 (six) hours as needed for pain. 11/15/16  Yes [provider]  aspirin EC 81 MG tablet Take 1 tablet (81 mg total) by mouth daily. 06/14/17   Rayel Santizo C, PA-C  butalbital-acetaminophen-caffeine (FIORICET, ESGIC) 50-325-40 MG tablet Take 1-2 tablets by mouth every 6 (six) hours as needed for headache. Patient not taking: Reported on 01/05/2017 12/07/16 12/07/17  Loney Hering, MD  HYDROcodone-acetaminophen [facility]) 5-325 MG tablet Take 1 tablet by mouth every 6 (six) hours as needed for up to 5 doses for severe pain. Patient not taking: Reported on 06/14/2017 01/10/17   Darel Hong, MD  ibuprofen (ADVIL,MOTRIN) 800 MG  tablet Take 1 tablet (800 mg total) by mouth every 8 (eight) hours as needed. Patient not taking: Reported on 01/05/2017 12/24/14   Eula Listen, MD  ondansetron (ZOFRAN ODT) 4 MG disintegrating tablet Take 1 tablet (4 mg total) by mouth every 8 (eight) hours as needed for nausea or vomiting. Patient not taking: Reported on 06/14/2017 01/05/17   Rudene Re, MD    Family History Family History  Problem Relation Age of Onset  . Diabetes Mother   . Breast cancer Mother 67  . Thyroid disease Mother   . Depression Mother   . Hypertension Father   . Heart disease Father   . Cancer Father        Throat cancer  . Diabetes Maternal Grandmother   . Cancer Maternal Grandmother        uterine  . Depression Maternal Grandmother     Social History Social History   Tobacco Use  . Smoking status: Never Smoker  . Smokeless tobacco: Never Used  Substance Use Topics  . Alcohol use: No  . Drug use: No     Allergies   Lamotrigine   Review of Systems Review of Systems  Constitutional: Negative for chills, diaphoresis and fever.  Respiratory: Positive for shortness of breath (intermittent).   Cardiovascular: Positive for chest pain. Negative for leg swelling.  Gastrointestinal: Positive for diarrhea (resolved) and nausea. Negative for abdominal pain, blood in stool and vomiting.  Neurological: Negative for weakness and numbness.  All other systems reviewed and are negative.    Physical Exam Updated Vital Signs BP 139/83 (BP Location: Right Arm)   Pulse 85   Temp 97.9 F (36.6 C) (Oral)   Resp 14   Ht 5\' 1"  (1.549 m)   Wt 83.9 kg (185 lb)   LMP 05/12/2011   SpO2 100%   BMI 34.96 kg/m   Physical Exam  Constitutional: She appears well-developed and well-nourished. No distress.  HENT:  Head: Normocephalic and atraumatic.  Eyes: Conjunctivae are normal.  Neck: Normal range of motion. Neck supple.  Cardiovascular: Normal rate, regular rhythm, normal heart  sounds and intact distal pulses.  Pulses:      Radial pulses are 2+ on the right side, and 2+ on the left side.       Dorsalis pedis pulses are 2+ on the right side, and 2+ on the left side.       Posterior tibial pulses are 2+ on the right side, and 2+ on the left side.  Pulmonary/Chest: Effort normal and breath sounds normal. No respiratory distress.  Abdominal: Soft. There is no tenderness. There is no guarding.  Musculoskeletal: She exhibits no  edema or tenderness.  No tenderness, swelling, erythema, increased warmth, deformity, or crepitus to the left upper extremity. Range of motion normal and without pain through the cardinal directions of the left shoulder, elbow, and wrist.  Normal motor function intact in all extremities and spine. No midline spinal tenderness.   Lymphadenopathy:    She has no cervical adenopathy.  Neurological: She is alert.  No sensory deficits noted in the bilateral upper extremities. Abduction and adduction of the fingers intact against resistance. Grip strength equal bilaterally. Supination and pronation intact against resistance. Strength 5/5 through the cardinal directions of the bilateral wrists. Strength 5/5 with flexion and extension of the bilateral elbows. Patient can touch the thumb to each one of the fingertips without difficulty.    Skin: Skin is warm and dry. Capillary refill takes less than 2 seconds. She is not diaphoretic.  Psychiatric: She has a normal mood and affect. Her behavior is normal.  Nursing note and vitals reviewed.    ED Treatments / Results  Labs (all labs ordered are listed, but only abnormal results are displayed) Labs Reviewed  BASIC METABOLIC PANEL - Abnormal; Notable for the following components:      Result Value   Glucose, Bld 167 (*)    All other components within normal limits  CBC - Abnormal; Notable for the following components:   Hemoglobin 11.6 (*)    All other components within normal limits  D-DIMER,  QUANTITATIVE (NOT AT [facility])  I-STAT TROPONIN, ED  I-STAT BETA HCG BLOOD, ED ([facility], [facility], [facility] ONLY)  I-STAT TROPONIN, ED    EKG EKG Interpretation  Date/Time:  Tuesday June 14 2017 12:32:57 EDT Ventricular Rate:  99 PR Interval:  158 QRS Duration: 76 QT Interval:  326 QTC Calculation: 418 R Axis:   58 Text Interpretation:  Normal sinus rhythm Normal ECG No significant change since last tracing Confirmed by Merrily Pew [phone]) on 06/14/2017 8:15:39 PM   Radiology Dg Chest 2 View  Result Date: 06/14/2017 CLINICAL DATA:  Chest pain and shortness of breath EXAM: CHEST - 2 VIEW COMPARISON:  April 20, 2015 FINDINGS: There is no edema or consolidation. The heart size and pulmonary vascularity are normal. No adenopathy. There is postoperative change in the lower cervical region. No pneumothorax. IMPRESSION: No edema or consolidation. Electronically Signed   By: Lowella Grip III M.D.   On: 06/14/2017 13:00   Duplex ultrasound Preliminary notes--Left upper extremities venous duplex study completed. Negative for DVT.   Hongying Landry Mellow (RDMS RVT) 06/14/17 7:02 PM    Procedures Procedures (including critical care time)  Medications Ordered in ED Medications  nitroGLYCERIN (NITROSTAT) SL tablet 0.4 mg (has no administration in time range)  aspirin chewable tablet 243 mg (has no administration in time range)  aspirin chewable tablet 81 mg (81 mg Oral Given 06/14/17 1926)  ibuprofen (ADVIL,MOTRIN) tablet 600 mg (600 mg Oral Given 06/14/17 1926)  HYDROcodone-acetaminophen (NORCO/VICODIN) 5-325 MG per tablet 1 tablet (1 tablet Oral Given 06/14/17 1926)     Initial Impression / Assessment and Plan / ED Course  I have reviewed the triage vital signs and the nursing notes.  Pertinent labs & imaging results that were available during my care of the patient were reviewed by me and considered in my medical decision making (see chart for details).  Clinical Course as of Jun 15 2134  Tue Jun 14, 2017  2014 Patient states she has changed her mind and would like to be admitted.  Her chest pain  has resolved, however, her left arm pain is still 5/10.   [SJ]  2059 Spoke with Dr. Silas [place], hospitalist. Agrees to admit the patient.    [SJ]    Clinical Course User Index [SJ] Kasaundra Fahrney C, PA-C    Patient presents with left arm pain and chest pain. Patient is nontoxic appearing, afebrile, not tachycardic, not tachypneic, not hypotensive, maintains excellent SPO2 on room air, and is in no apparent distress.  HEART score is 4, indicating moderate risk for a cardiac event. Wells criteria score is 0, indicating low risk for PE. Delta troponins negative. Low suspicion for dissection: No widened mediastinum on CXR, no tearing/ripping pain, no abdominal pain, pulses equal, no neuro deficits, and d-dimer negative.   With the patient's family history of early MI, persistent chest pain, and HEART score of 4, I think admission for chest pain rule out is reasonable.    Findings and plan of care discussed with Merrily Pew, MD.   Vitals:   06/14/17 1323 06/14/17 1411 06/14/17 1519 06/14/17 1600  BP: 133/88 137/90 137/86 139/83  Pulse: 93 89 86 85  Resp: 16 16 16 14   Temp:   97.9 F (36.6 C)   TempSrc:   Oral   SpO2: 96% 100% 98% 100%  Weight:      Height:       NT note was reviewed. I do not see any evidence of circulation abnormality.    Final Clinical Impressions(s) / ED Diagnoses   Final diagnoses:  Precordial pain    ED Discharge Orders        Ordered    aspirin EC 81 MG tablet  Daily     06/14/17 2008         Alicia Seib C, PA-C 06/14/17 2141    Mesner, Corene Cornea, MD 06/15/17 0010

## 2017-06-14 NOTE — Plan of Care (Signed)
  Problem: Activity: Goal: Risk for activity intolerance will decrease Outcome: Completed/Met  Pt ambulates independently with steady gait.

## 2017-06-14 NOTE — ED Notes (Signed)
Ambulated pt from room 22 around Pod desk and back to her room, SO2 stayed between 98 and 100

## 2017-06-14 NOTE — ED Triage Notes (Signed)
Pt started having left arm pain on Sunday. Developed into chest pain/chest pressure yesterday; pain also goes into her back. Pt has felt nauseous/diaphoretic. Pt has felt dizzy.

## 2017-06-14 NOTE — Progress Notes (Signed)
Preliminary notes--Left upper extremities venous duplex study completed. Negative for DVT.   Hongying Bill Yohn (RDMS RVT) 06/14/17 7:02 PM

## 2017-06-14 NOTE — ED Notes (Addendum)
Please note: Pt's fingers are noted at this time to appear cyanotic. Pt states that she does not feel light headed or dizzy at this time. Pt states that her fingers "turned really red, then white, then blue". She denies CP at this time. Vitals were re-taken and found to be normal. Hope, RN, in triage notified (no NF at this time).

## 2017-06-15 ENCOUNTER — Observation Stay (HOSPITAL_BASED_OUTPATIENT_CLINIC_OR_DEPARTMENT_OTHER): Payer: Federal, State, Local not specified - PPO

## 2017-06-15 DIAGNOSIS — E785 Hyperlipidemia, unspecified: Secondary | ICD-10-CM | POA: Diagnosis not present

## 2017-06-15 DIAGNOSIS — R079 Chest pain, unspecified: Secondary | ICD-10-CM | POA: Diagnosis not present

## 2017-06-15 DIAGNOSIS — E1169 Type 2 diabetes mellitus with other specified complication: Secondary | ICD-10-CM

## 2017-06-15 DIAGNOSIS — F319 Bipolar disorder, unspecified: Secondary | ICD-10-CM

## 2017-06-15 DIAGNOSIS — R9439 Abnormal result of other cardiovascular function study: Secondary | ICD-10-CM

## 2017-06-15 DIAGNOSIS — I10 Essential (primary) hypertension: Secondary | ICD-10-CM

## 2017-06-15 DIAGNOSIS — R072 Precordial pain: Secondary | ICD-10-CM

## 2017-06-15 DIAGNOSIS — M79602 Pain in left arm: Secondary | ICD-10-CM | POA: Diagnosis not present

## 2017-06-15 DIAGNOSIS — E1159 Type 2 diabetes mellitus with other circulatory complications: Secondary | ICD-10-CM

## 2017-06-15 LAB — GLUCOSE, CAPILLARY
Glucose-Capillary: 169 mg/dL — ABNORMAL HIGH (ref 65–99)
Glucose-Capillary: 120 mg/dL — ABNORMAL HIGH (ref 65–99)
Glucose-Capillary: 197 mg/dL — ABNORMAL HIGH (ref 65–99)
Glucose-Capillary: 124 mg/dL — ABNORMAL HIGH (ref 65–99)

## 2017-06-15 LAB — NM MYOCAR MULTI W/SPECT W/WALL MOTION / EF
Peak HR: 106 {beats}/min
Rest HR: 71 {beats}/min

## 2017-06-15 LAB — TROPONIN I
Troponin I: <0.03 ng/mL (ref <0.03)
Troponin I: <0.03 ng/mL (ref <0.03)

## 2017-06-15 LAB — HIV ANTIBODY (ROUTINE TESTING W REFLEX): HIV Screen 4th Generation wRfx: NONREACTIVE

## 2017-06-15 MED ORDER — METOPROLOL TARTRATE 50 MG PO TABS
50.0000 mg | ORAL_TABLET | Freq: Every day | ORAL | Status: DC
Start: 2017-06-15 — End: 2017-06-16
  Administered 2017-06-15: 50 mg via ORAL
  Filled 2017-06-15: qty 1

## 2017-06-15 MED ORDER — TECHNETIUM TC 99M TETROFOSMIN IV KIT
30.0000 | PACK | Freq: Once | INTRAVENOUS | Status: AC | PRN
Start: 2017-06-15 — End: 2017-06-15
  Administered 2017-06-15: 30 via INTRAVENOUS

## 2017-06-15 MED ORDER — ASPIRIN EC 81 MG PO TBEC
81.0000 mg | DELAYED_RELEASE_TABLET | Freq: Every day | ORAL | Status: DC
  Administered 2017-06-16: 81 mg via ORAL
  Filled 2017-06-15: qty 1

## 2017-06-15 MED ORDER — METOPROLOL TARTRATE 50 MG PO TABS
50.0000 mg | ORAL_TABLET | ORAL | Status: AC
  Administered 2017-06-16: 50 mg via ORAL

## 2017-06-15 MED ORDER — REGADENOSON 0.4 MG/5ML IV SOLN
INTRAVENOUS | Status: AC
  Filled 2017-06-15: qty 5

## 2017-06-15 MED ORDER — TECHNETIUM TC 99M TETROFOSMIN IV KIT
10.0000 | PACK | Freq: Once | INTRAVENOUS | Status: AC | PRN
Start: 2017-06-15 — End: 2017-06-15
  Administered 2017-06-15: 10 via INTRAVENOUS

## 2017-06-15 MED ORDER — REGADENOSON 0.4 MG/5ML IV SOLN
0.4000 mg | Freq: Once | INTRAVENOUS | Status: AC
  Administered 2017-06-15: 0.4 mg via INTRAVENOUS
  Filled 2017-06-15: qty 5

## 2017-06-15 NOTE — Consult Note (Addendum)
Cardiology Consultation:   Patient ID: REDA CITRON; [id number]; 07-24-1965   Admit date: 06/14/2017 Date of Consult: 06/15/2017  Primary Care Provider: London Pepper, MD Primary Cardiologist: New to Dr. Percival Spanish  Chief Complaint: left arm pain, chest pain  Patient Profile:   Morgan [name] is a 52 y.o. female with a hx of DM, HTN, HLD, bipolar disorder, L pelvic kidney, fam hx CAD (father had heart issues starting age 34) who is being seen today for the evaluation of abnormal stress test at the request of Dr. Lonny Prude upon our review of result review.  History of Present Illness:   Morgan [name] has no prior cardiac history and denies any prior cardiac testing. She does not routinely exercise. 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 On Sunday at rest she began noticing sharp L arm pain on Sunday, constant, from her shoulder down to her hand. This persisted without any relief and then migrating into her left chest the following day (Monday) described as both sharp and pressure-like. It has been persistent since that time. It was worse with any kind of moving around -> not a specific movement per se, but with the actual act of exerting herself. However, she reports that resting does not always necessarily improve it. It has not been improved by anything in particular. She took Tylenol at home with no relief. She also had some nausea and diaphoresis. Due to persistent symptoms she presented to the ER for further workup. She received Norco in ED but had no real change in symptoms until they finally eased off spontaneously overnight. Troponins neg and d-dimer normal. EKG shows NSR with early upsloped ST segment I, II, V5-V6 as seen in prior tracings without acute changes from prior. Per d/w [facility], [name] screened chart and spoke with IM with plan for stress test. Pt felt somewhat weak due to not eating and therefore  requested Lexiscan. Nuclear stress test was poor quality with decreased counts throughout myocardium during stress in comparison to rest images, with evidence of transient ischemic dilatation with an increased TID of 1.33, LVEF 55-65%. Her dad had some sort of heart event at age 65 (details unclear) and recently was told he had a recurrent event with a heart blockage. She remains comfortable after her stress test. No lipids on file. CBG 167. VSS.   Past Medical History:  Diagnosis Date  . Anxiety   . Bipolar disorder ([place])   . Breast mass    right  . Cervical dysplasia   . Diabetes mellitus without complication ([place])   . Endometriosis   . GERD (gastroesophageal reflux disease)   . Hypercholesteremia   . Hypertension   . Migraine   . Pelvic kidney    left  . Postpartum depression    hx of    Past Surgical History:  Procedure Laterality Date  . BACK SURGERY  2014  . BREAST LUMPECTOMY WITH RADIOACTIVE SEED LOCALIZATION Right 10/28/2014   Procedure: RIGHT BREAST LUMPECTOMY WITH RADIOACTIVE SEED LOCALIZATION;  Surgeon: [name], MD;  Location: [facility];  Service: General;  Laterality: Right;  . CESAREAN SECTION  03  . COLPOSCOPY    . GYNECOLOGIC CRYOSURGERY    . NECK SURGERY  2012  . PELVIC LAPAROSCOPY  91     Inpatient Medications: Scheduled Meds: . ARIPiprazole  10 mg Oral QHS  . aspirin EC  325 mg  Oral Daily  . atorvastatin  10 mg Oral QHS  . dicyclomine  10 mg Oral BID  . DULoxetine  60 mg Oral QHS  . enoxaparin (LOVENOX) injection  40 mg Subcutaneous QHS  . insulin aspart  0-15 Units Subcutaneous TID WC  . losartan  50 mg Oral QHS  . pantoprazole  40 mg Oral Daily  . regadenoson      . temazepam  15 mg Oral QHS   Continuous Infusions:  PRN Meds: acetaminophen, morphine injection, nitroGLYCERIN, ondansetron (ZOFRAN) IV, traMADol  Home Meds: Prior to Admission medications   Medication Sig Start Date End Date Taking? Authorizing Provider    ARIPiprazole (ABILIFY) 10 MG tablet Take 1 tablet by mouth at bedtime.  08/07/14  Yes [provider]  atorvastatin (LIPITOR) 10 MG tablet Take 10 mg by mouth at bedtime. 11/04/16  Yes [provider]  dicyclomine (BENTYL) 10 MG capsule Take 10 mg by mouth 2 (two) times daily. 04/25/17  Yes [provider]  DULoxetine (CYMBALTA) 60 MG capsule Take 60 mg by mouth at bedtime. 11/04/16  Yes [provider]  estazolam (PROSOM) 2 MG tablet Take 2 mg by mouth as needed. 04/13/17  Yes [provider]  losartan (COZAAR) 50 MG tablet Take 50 mg by mouth at bedtime.    Yes [provider]  metFORMIN (GLUCOPHAGE) 1000 MG tablet Take 1,000 mg by mouth 2 (two) times daily. 10/27/16  Yes [provider]  omeprazole (PRILOSEC) 20 MG capsule Take 20 mg by mouth at bedtime.    Yes [provider]  traMADol (ULTRAM) 50 MG tablet Take 50 mg by mouth every 6 (six) hours as needed for pain. 11/15/16  Yes [provider]  aspirin EC 81 MG tablet Take 1 tablet (81 mg total) by mouth daily. 06/14/17   Joy, Helane Gunther, PA-C    Allergies:    Allergies  Allergen Reactions  . Lamotrigine Rash    Social History:   Social History   Socioeconomic History  . Marital status: Married    Spouse name: Not on file  . Number of children: Not on file  . Years of education: Not on file  . Highest education level: Not on file  Occupational History  . Not on file  Social Needs  . Financial resource strain: Not on file  . Food insecurity:    Worry: Not on file    Inability: Not on file  . Transportation needs:    Medical: Not on file    Non-medical: Not on file  Tobacco Use  . Smoking status: Never Smoker  . Smokeless tobacco: Never Used  Substance and Sexual Activity  . Alcohol use: No  . Drug use: No  . Sexual activity: Yes    Birth control/protection: Other-see comments    Comment: vasectomy  Lifestyle  . Physical activity:    Days per  week: Not on file    Minutes per session: Not on file  . Stress: Not on file  Relationships  . Social connections:    Talks on phone: Not on file    Gets together: Not on file    Attends religious service: Not on file    Active member of club or organization: Not on file    Attends meetings of clubs or organizations: Not on file    Relationship status: Not on file  . Intimate partner violence:    Fear of current or ex partner: Not on file  Emotionally abused: Not on file    Physically abused: Not on file    Forced sexual activity: Not on file  Other Topics Concern  . Not on file  Social History Narrative  . Not on file    Family History:   The patient's family history includes Breast cancer (age of onset: 63) in her mother; Cancer in her father and maternal grandmother; Depression in her maternal grandmother and mother; Diabetes in her maternal grandmother and mother; Heart attack (age of onset: 58) in her father; Heart disease in her father; Hypertension in her father; Stroke in her father; Thyroid disease in her mother.   ROS:  Please see the history of present illness.  All other ROS reviewed and negative.     Physical Exam/Data:   Vitals:   06/15/17 1220 06/15/17 1221 06/15/17 1224 06/15/17 1534  BP: (!) 119/93 129/86 134/80 116/68  Pulse:    87  Resp:      Temp:    98.1 F (36.7 C)  TempSrc:    Oral  SpO2:    94%  Weight:      Height:        Intake/Output Summary (Last 24 hours) at 06/15/2017 1637 Last data filed at 06/15/2017 0847 Gross per 24 hour  Intake 420 ml  Output -  Net 420 ml   Filed Weights   06/14/17 1234 06/15/17 0643  Weight: 185 lb (83.9 kg) 186 lb 6.4 oz (84.6 kg)   Body mass index is 35.22 kg/m.  General: Well developed, well nourished, in no acute distress. Head: Normocephalic, atraumatic, sclera non-icteric, no xanthomas, nares are without discharge.  Neck: Negative for carotid bruits. JVD not elevated. Lungs: Clear bilaterally to  auscultation without wheezes, rales, or rhonchi. Breathing is unlabored. Heart: RRR with S1 S2. No murmurs, rubs, or gallops appreciated. Abdomen: Soft, non-tender, non-distended with normoactive bowel sounds. No hepatomegaly. No rebound/guarding. No obvious abdominal masses. Msk:  Strength and tone appear normal for age. Extremities: No clubbing or cyanosis. No edema.  Distal pedal pulses are 2+ and equal bilaterally. Neuro: Alert and oriented X 3. No facial asymmetry. No focal deficit. Moves all extremities spontaneously. Psych:  Responds to questions appropriately with a normal affect.  EKG:  The EKG was personally reviewed and demonstrates NSR with ST upsloping in I, II, V5-V6 as seen in prior tracings.without acute changes from prior.    Relevant CV Studies: See above  Laboratory Data:  Chemistry Recent Labs  Lab 06/14/17 1241 06/14/17 2123  NA 139  --   K 4.0  --   CL 106  --   CO2 27  --   GLUCOSE 167*  --   BUN 6  --   CREATININE 0.66 0.66  CALCIUM 9.9  --   GFRNONAA >60 >60  GFRAA >60 >60  ANIONGAP 6  --     No results for input(s): PROT, ALBUMIN, AST, ALT, ALKPHOS, BILITOT in the last 168 hours. Hematology Recent Labs  Lab 06/14/17 1241 06/14/17 2123  WBC 5.2 6.1  RBC 4.44 4.40  HGB 11.6* 11.7*  HCT 36.2 35.8*  MCV 81.5 81.4  MCH 26.1 26.6  MCHC 32.0 32.7  RDW 14.8 14.7  PLT 182 187   Cardiac Enzymes Recent Labs  Lab 06/14/17 2200 06/15/17 0043 06/15/17 0341  TROPONINI <0.03 <0.03 <0.03    Recent Labs  Lab 06/14/17 1255 06/14/17 1857  TROPIPOC 0.00 0.00    BNPNo results for input(s): BNP, PROBNP in the last 168  hours.  DDimer  Recent Labs  Lab 06/14/17 1853  DDIMER <0.27    Radiology/Studies:  Dg Chest 2 View  Result Date: 06/14/2017 CLINICAL DATA:  Chest pain and shortness of breath EXAM: CHEST - 2 VIEW COMPARISON:  April 20, 2015 FINDINGS: There is no edema or consolidation. The heart size and pulmonary vascularity are normal. No  adenopathy. There is postoperative change in the lower cervical region. No pneumothorax. IMPRESSION: No edema or consolidation. Electronically Signed   By: Lowella Grip III M.D.   On: 06/14/2017 13:00   Nm Myocar Multi W/spect W/wall Motion / Ef  Addendum Date: 06/15/2017    There was no ST segment deviation noted during stress.  The left ventricular ejection fraction is normal (55-65%).  Poor image quality with decreased counts throughout myocardium during stress in comparison to rest images  There is evidence of transient ischemic dilatation with an increased TID of 1.33 which can be seen with multivessel CAD  Consider coronary CTA with FFR    Result Date: 06/15/2017  There was no ST segment deviation noted during stress.  The left ventricular ejection fraction is normal (55-65%).  Poor image quality with decreased counts throughout myocardium during stress in comparison to rest images  There is evidence of transient ischemic dilatation with an increased TID of 1.33  Consider coronary CTA with FFR     Assessment and Plan:   1. Chest pain/left arm pain, mostly atypical - stress test was equivocal with poor quality images. She has multiple cardiac risk factors. D/w Dr. Radford Pax - impossible to tell if TID was accurate based on the study images. There is no objective evidence of ischemia. Plan cardiac CT tomorrow. Need to work on beta blockade tonight - will give Lopressor 50mg  tonight and then again tomorrow 1 hour prior to test. Hold losartan to allow BP room to beta block down. Per standard cardiac CT instructions, patient can drink water until 1 hour before the test. She is not allowed to eat food 4 hours prior to the test. She is allowed to take regular meds before the test. She should avoid caffeine/decaffeinated versions of drinks or chocolate until after test (wrote care order for such). Spoke with Dr. Meda Coffee regarding test and she will read. Decrease ASA to 81mg .  2. HTN - controlled;  see above re: med adjustment.  3. Hyperlipidemia - lipid profile is already ordered for tomorrow morning. Given diabetes and family history, would aim for strict LDL control and titrate statin if appropriate.  4. Diabetes mellitus - check A1C to assess. She is on Metformin at home which is on hold.  For questions or updates, please contact [facility] Please consult [email or web address] for contact info under Cardiology/STEMI.    Signed, [name], PA-C  06/15/2017 4:37 PM   History and all data above reviewed.  Patient examined.  I agree with the findings as above.  Her chest pain is as above.  It has atypical greater than typical features.  However, she has significant risk factors and a perfusions study that is equivocal.  She has had no acute EKG changes or enzyme elevations.   The patient exam reveals COR:RRR  ,  Lungs: Clear  ,  Abd: Positive bowel sounds, no rebound no guarding, Ext No edema   .  All available labs, radiology testing, previous records reviewed. Agree with documented assessment and plan. Chest pain with [facility] as above.  Needs further imaging.  Plan CTA with FFR.  Jeneen Rinks Seena Face  6:01 PM  06/15/2017

## 2017-06-15 NOTE — Discharge Summary (Signed)
Physician Discharge Summary  [name] ERX:[id number] DOB: 1965/04/22 DOA: 06/14/2017  PCP: London Pepper, MD  Admit date: 06/14/2017 Discharge date: 06/15/2017  Admitted From: Home Disposition: Home  Recommendations for Outpatient Follow-up:  1. Follow up with PCP in 1 week 2. Follow up with cardiology for outpatient echo 3. Please obtain BMP/CBC in one week 4. Please follow up on the following pending results: None  Home Health: None Equipment/Devices: None  Discharge Condition: Stable CODE STATUS: Full code Diet recommendation: Heart healthy    Brief/Interim Summary:  Admission HPI written by Lady Deutscher, MD   Chief Complaint: Chest pain and left arm radiating to the back and center of her chest  HPI: Morgan [name] is a 52 y.o. female with medical history significant of diabetes, hypertension, hypercholesterolemia with a family history of her father having an MI at 15 years old who presents the emergency department with a complaint of left arm pain and left chest pain.  Pain has been present since Sunday it is intermittent in her chest and constant in her arm.  The pain is described as a pressure and sharpness.  She also states that it is a stabbing pain that radiates to the center of her chest and in her chest the pain is dull.  It with exertional dyspnea it improves with rest it is present at the current time and did not get any relief with treatments in the emergency department.  ED Course: D-dimer was negative labs were reviewed and acceptable.  She was given a Norco, ibuprofen, and aspirin did not have any relief of her discomfort.  Her EKG was unremarkable her troponin was negative.  She will be observed overnight in the hospital for further evaluation and management.    Hospital course:  Chest pain Troponin overnight has been negative.  Chest pain resolved completely.  EKG this morning appears normal. Nuclear stress test performed and significant  for transient ischemic dilation which can be seen with multivessel CAD. Coronary CT performed and significant for mildly dilated PA. Cardiology recommending outpatient Transthoracic Echocardiogram and follow-up. Continued aspirin.  Bipolar disorder Anxiety Continued Cymbalta, Restoril and Abilify  Essential hypertension Well-controlled. Continued antihypertensives.  Hyperlipidemia Continued atorvastatin  Diabetes mellitus Type II uncontrolled with hyperglycemia Last hemoglobin A1c of 5.2% in 2013. Sliding scale insulin while inpatient   Discharge Diagnoses:  Principal Problem:   Chest pain Active Problems:   Bipolar disorder ([place])   Hypertension complicating diabetes ([place])   Type 2 diabetes mellitus with hyperlipidemia ([place])   Hyperlipidemia associated with type 2 diabetes mellitus ([place])    Discharge Instructions   Allergies as of 06/16/2017      Reactions   Lamotrigine Rash      Medication List    TAKE these medications   ARIPiprazole 10 MG tablet Commonly known as:  ABILIFY Take 1 tablet by mouth at bedtime.   aspirin EC 81 MG tablet Take 1 tablet (81 mg total) by mouth daily.   atorvastatin 10 MG tablet Commonly known as:  LIPITOR Take 10 mg by mouth at bedtime.   dicyclomine 10 MG capsule Commonly known as:  BENTYL Take 10 mg by mouth 2 (two) times daily.   DULoxetine 60 MG capsule Commonly known as:  CYMBALTA Take 60 mg by mouth at bedtime.   estazolam 2 MG tablet Commonly known as:  PROSOM Take 2 mg by mouth as needed.   losartan 50 MG tablet Commonly known as:  COZAAR Take 50 mg  by mouth at bedtime.   metFORMIN 1000 MG tablet Commonly known as:  GLUCOPHAGE Take 1,000 mg by mouth 2 (two) times daily.   omeprazole 20 MG capsule Commonly known as:  PRILOSEC Take 20 mg by mouth at bedtime. Notes to patient:  IMPORTANT: Do not take any metformin for 48 hours as discussed.   traMADol 50 MG tablet Commonly known as:  ULTRAM Take 50 mg by  mouth every 6 (six) hours as needed for pain.      Follow-up Information    [facility].   Why:  As soon as possible for follow up Contact information: [place] [id number] [place].   Specialty:  Emergency Medicine Why:  As needed, If symptoms worsen Contact information: [address] [id number] [place] [address] [phone]         Allergies  Allergen Reactions  . Lamotrigine Rash    Consultations:  Cardiology   Procedures/Studies: Dg Chest 2 View  Result Date: 06/14/2017 CLINICAL DATA:  Chest pain and shortness of breath EXAM: CHEST - 2 VIEW COMPARISON:  April 20, 2015 FINDINGS: There is no edema or consolidation. The heart size and pulmonary vascularity are normal. No adenopathy. There is postoperative change in the lower cervical region. No pneumothorax. IMPRESSION: No edema or consolidation. Electronically Signed   By: Lowella Grip III M.D.   On: 06/14/2017 13:00     Nuclear stress test (5/1)  There was no ST segment deviation noted during stress.  The left ventricular ejection fraction is normal (55-65%).  Poor image quality with decreased counts throughout myocardium during stress in comparison to rest images  There is evidence of transient ischemic dilatation with an increased TID of 1.33 which can be seen with multivessel CAD  Consider coronary CTA with FFR   Subjective: Mild chest pain overnight.  Discharge Exam: Vitals:   06/15/17 1224 06/15/17 1534  BP: 134/80 116/68  Pulse:  87  Resp:    Temp:  98.1 F (36.7 C)  SpO2:  94%   Vitals:   06/15/17 1220 06/15/17 1221 06/15/17 1224 06/15/17 1534  BP: (!) 119/93 129/86 134/80 116/68  Pulse:    87  Resp:      Temp:    98.1 F (36.7 C)  TempSrc:    Oral  SpO2:    94%  Weight:      Height:         General: Pt is alert, awake, not in acute distress Cardiovascular: RRR, S1/S2 +, no rubs, no gallops. Reproducible chest pain. Respiratory: CTA bilaterally, no wheezing, no rhonchi Abdominal: Soft, NT, ND, bowel sounds + Extremities: no edema, no cyanosis    The results of significant diagnostics from this hospitalization (including imaging, microbiology, ancillary and laboratory) are listed below for reference.     Microbiology: No results found for this or any previous visit (from the past 240 hour(s)).   Labs: BNP (last 3 results) No results for input(s): BNP in the last 8760 hours. Basic Metabolic Panel: Recent Labs  Lab 06/14/17 1241 06/14/17 2123  NA 139  --   K 4.0  --   CL 106  --   CO2 27  --   GLUCOSE 167*  --   BUN 6  --   CREATININE 0.66 0.66  CALCIUM 9.9  --    Liver Function Tests: No results for input(s):  AST, ALT, ALKPHOS, BILITOT, PROT, ALBUMIN in the last 168 hours. No results for input(s): LIPASE, AMYLASE in the last 168 hours. No results for input(s): AMMONIA in the last 168 hours. CBC: Recent Labs  Lab 06/14/17 1241 06/14/17 2123  WBC 5.2 6.1  HGB 11.6* 11.7*  HCT 36.2 35.8*  MCV 81.5 81.4  PLT 182 187   Cardiac Enzymes: Recent Labs  Lab 06/14/17 2200 06/15/17 0043 06/15/17 0341  TROPONINI <0.03 <0.03 <0.03   BNP: Invalid input(s): POCBNP CBG: Recent Labs  Lab 06/15/17 0752 06/15/17 1105  GLUCAP 169* 120*   D-Dimer Recent Labs    06/14/17 1853  DDIMER <0.27   Hgb A1c No results for input(s): HGBA1C in the last 72 hours. Lipid Profile No results for input(s): CHOL, HDL, LDLCALC, TRIG, CHOLHDL, LDLDIRECT in the last 72 hours. Thyroid function studies No results for input(s): TSH, T4TOTAL, T3FREE, THYROIDAB in the last 72 hours.  Invalid input(s): FREET3 Anemia work up No results for input(s): VITAMINB12, FOLATE, FERRITIN, TIBC, IRON, RETICCTPCT in the last 72 hours. Urinalysis    Component Value Date/Time    COLORURINE YELLOW (A) 01/10/2017 1108   APPEARANCEUR CLEAR (A) 01/10/2017 1108   APPEARANCEUR Hazy 05/31/2014 1356   LABSPEC 1.010 01/10/2017 1108   LABSPEC 1.011 05/31/2014 1356   PHURINE 6.0 01/10/2017 1108   GLUCOSEU NEGATIVE 01/10/2017 1108   GLUCOSEU Negative 05/31/2014 1356   HGBUR NEGATIVE 01/10/2017 1108   BILIRUBINUR NEGATIVE 01/10/2017 1108   BILIRUBINUR Negative 05/31/2014 National 01/10/2017 1108   PROTEINUR NEGATIVE 01/10/2017 1108   UROBILINOGEN 0.2 12/01/2012 1539   NITRITE NEGATIVE 01/10/2017 1108   LEUKOCYTESUR NEGATIVE 01/10/2017 1108   LEUKOCYTESUR Trace 05/31/2014 1356    SIGNED:   Cordelia Poche, MD [facility] 06/15/2017, 3:59 PM

## 2017-06-15 NOTE — Plan of Care (Signed)
  Problem: Education: Goal: Knowledge of General Education information will improve Outcome: Completed/Met

## 2017-06-15 NOTE — Progress Notes (Signed)
PROGRESS NOTE    Morgan [name]  HCW:[id number] DOB: [birth year] DOA: 06/14/2017 PCP: London Pepper, MD   Brief Narrative: Morgan [name] is a 52 y.o. female with a history of diabetes, hypertension, hypercholesterolemia.  Patient also with a family history of heart disease.  Patient presented secondary to chest pain with associated left arm pain concerning for ACS.  Overnight troponin was trended and has been negative.  EKG is normal sinus rhythm.  Stress test obtained and was abnormal.  Cardiology recommending formal consult with possible further evaluation for ACS.   Assessment & Plan:   Principal Problem:   Chest pain Active Problems:   Bipolar disorder ([place])   Hypertension complicating diabetes ([place])   Type 2 diabetes mellitus with hyperlipidemia ([place])   Hyperlipidemia associated with type 2 diabetes mellitus (HCC)   Chest pain Troponin overnight has been negative.  Chest pain resolved completely.  EKG this morning appears normal.  Plan for nuclear stress test today which was abnormal.  Discussed with cardiology who will formally consult today as patient will likely need further work-up. -Cardiology recommendations -Continue aspirin -AM lipid panel  Bipolar disorder Anxiety -Continue Cymbalta, Restoril and Abilify  Essential hypertension Well-controlled -Continue losartan  Hyperlipidemia -Continue atorvastatin  Diabetes mellitus Type II uncontrolled with hyperglycemia Last hemoglobin A1c of 5.2% in 2013 -Continue sliding scale insulin   DVT prophylaxis: Lovenox Code Status:   Code Status: Full Code Family Communication: None at bedside Disposition Plan: Anticipate discharge home in 24 hours pending cardiology work-up   Consultants:   Cardiology  Procedures:   Stress test  Antimicrobials:  None   Subjective: No chest pain.  No arm pain.  No dyspnea.  Objective: Vitals:   06/15/17 1220 06/15/17 1221 06/15/17 1224 06/15/17 1534  BP: (!) 119/93  129/86 134/80 116/68  Pulse:    87  Resp:      Temp:    98.1 F (36.7 C)  TempSrc:    Oral  SpO2:    94%  Weight:      Height:        Intake/Output Summary (Last 24 hours) at 06/15/2017 1559 Last data filed at 06/15/2017 0847 Gross per 24 hour  Intake 420 ml  Output -  Net 420 ml   Filed Weights   06/14/17 1234 06/15/17 0643  Weight: 83.9 kg (185 lb) 84.6 kg (186 lb 6.4 oz)    Examination:  General exam: Appears calm and comfortable  Respiratory system: Clear to auscultation. Respiratory effort normal. Cardiovascular system: S1 & S2 heard, RRR. No murmurs, rubs, gallops or clicks. Gastrointestinal system: Abdomen is nondistended, soft and nontender. No organomegaly or masses felt. Normal bowel sounds heard. Central nervous system: Alert and oriented. No focal neurological deficits. 5/5 UE strength with normal sensation of bilateral upper extremities. Extremities: No edema. No calf tenderness Skin: No cyanosis. No rashes Psychiatry: Judgement and insight appear normal. Mood & affect appropriate.     Data Reviewed: I have personally reviewed following labs and imaging studies  CBC: Recent Labs  Lab 06/14/17 1241 06/14/17 2123  WBC 5.2 6.1  HGB 11.6* 11.7*  HCT 36.2 35.8*  MCV 81.5 81.4  PLT 182 176   Basic Metabolic Panel: Recent Labs  Lab 06/14/17 1241 06/14/17 2123  NA 139  --   K 4.0  --   CL 106  --   CO2 27  --   GLUCOSE 167*  --   BUN 6  --   CREATININE 0.66 0.66  CALCIUM  9.9  --    GFR: Estimated Creatinine Clearance: 83.9 mL/min (by C-G formula based on SCr of 0.66 mg/dL). Liver Function Tests: No results for input(s): AST, ALT, ALKPHOS, BILITOT, PROT, ALBUMIN in the last 168 hours. No results for input(s): LIPASE, AMYLASE in the last 168 hours. No results for input(s): AMMONIA in the last 168 hours. Coagulation Profile: No results for input(s): INR, PROTIME in the last 168 hours. Cardiac Enzymes: Recent Labs  Lab 06/14/17 2200  06/15/17 0043 06/15/17 0341  TROPONINI <0.03 <0.03 <0.03   BNP (last 3 results) No results for input(s): PROBNP in the last 8760 hours. HbA1C: No results for input(s): HGBA1C in the last 72 hours. CBG: Recent Labs  Lab 06/15/17 0752 06/15/17 1105  GLUCAP 169* 120*   Lipid Profile: No results for input(s): CHOL, HDL, LDLCALC, TRIG, CHOLHDL, LDLDIRECT in the last 72 hours. Thyroid Function Tests: No results for input(s): TSH, T4TOTAL, FREET4, T3FREE, THYROIDAB in the last 72 hours. Anemia Panel: No results for input(s): VITAMINB12, FOLATE, FERRITIN, TIBC, IRON, RETICCTPCT in the last 72 hours. Sepsis Labs: No results for input(s): PROCALCITON, LATICACIDVEN in the last 168 hours.  No results found for this or any previous visit (from the past 240 hour(s)).       Radiology Studies: Dg Chest 2 View  Result Date: 06/14/2017 CLINICAL DATA:  Chest pain and shortness of breath EXAM: CHEST - 2 VIEW COMPARISON:  April 20, 2015 FINDINGS: There is no edema or consolidation. The heart size and pulmonary vascularity are normal. No adenopathy. There is postoperative change in the lower cervical region. No pneumothorax. IMPRESSION: No edema or consolidation. Electronically Signed   By: Lowella Grip III M.D.   On: 06/14/2017 13:00        Scheduled Meds: . ARIPiprazole  10 mg Oral QHS  . aspirin EC  325 mg Oral Daily  . atorvastatin  10 mg Oral QHS  . dicyclomine  10 mg Oral BID  . DULoxetine  60 mg Oral QHS  . enoxaparin (LOVENOX) injection  40 mg Subcutaneous QHS  . insulin aspart  0-15 Units Subcutaneous TID WC  . losartan  50 mg Oral QHS  . pantoprazole  40 mg Oral Daily  . regadenoson      . temazepam  15 mg Oral QHS   Continuous Infusions:   LOS: 0 days     Cordelia Poche, MD [facility] 06/15/2017, 3:59 PM Pager: [phone]  If 7PM-7AM, please contact night-coverage [email or web address] 06/15/2017, 3:59 PM

## 2017-06-15 NOTE — Progress Notes (Addendum)
   Morgan [name] presented for a nuclear stress test today. 52F with no prior cardiac hx, but a history of DM, HTN, HLD, bipolar disorder, fam hx CAD (father MI age 50). Began having sharp L arm pain on Sunday, constant, before evolving into her left chest the following day (Monday) described as both sharp and pressure-like. It was worse with any kind of moving around, not improved by anything in particular. She took Tylenol at home with no relief. Rec'd Norco in ED but no change in symptoms until they finally eased off overnight. Troponins neg and d-dimer normal. EKG shows NSR with early upsloped ST segment I, II, V5-V6 as seen in prior tracings.without acute changes from prior. Per d/w [facility], [name] screened chart and spoke with IM with plan for stress test. Pt feels somewhat weak due to not eating and prefers Lexiscan. No immediate complications.  Stress imaging is pending at this time.  Preliminary EKG findings may be listed in the chart, but the stress test result will not be finalized until perfusion imaging is complete.  [name], PA-C 06/15/2017, 12:18 PM

## 2017-06-16 ENCOUNTER — Observation Stay (HOSPITAL_COMMUNITY): Payer: Federal, State, Local not specified - PPO

## 2017-06-16 ENCOUNTER — Other Ambulatory Visit: Payer: Self-pay | Admitting: Physician Assistant

## 2017-06-16 DIAGNOSIS — F319 Bipolar disorder, unspecified: Secondary | ICD-10-CM | POA: Diagnosis not present

## 2017-06-16 DIAGNOSIS — R072 Precordial pain: Secondary | ICD-10-CM | POA: Diagnosis not present

## 2017-06-16 DIAGNOSIS — M79602 Pain in left arm: Secondary | ICD-10-CM | POA: Diagnosis not present

## 2017-06-16 DIAGNOSIS — E1169 Type 2 diabetes mellitus with other specified complication: Secondary | ICD-10-CM | POA: Diagnosis not present

## 2017-06-16 DIAGNOSIS — R0789 Other chest pain: Secondary | ICD-10-CM | POA: Diagnosis not present

## 2017-06-16 DIAGNOSIS — I288 Other diseases of pulmonary vessels: Secondary | ICD-10-CM

## 2017-06-16 DIAGNOSIS — E1159 Type 2 diabetes mellitus with other circulatory complications: Secondary | ICD-10-CM | POA: Diagnosis not present

## 2017-06-16 LAB — COMPREHENSIVE METABOLIC PANEL
ALT: 42 U/L (ref 14–54)
AST: 41 U/L (ref 15–41)
Albumin: 3.4 g/dL — ABNORMAL LOW (ref 3.5–5.0)
Alkaline Phosphatase: 83 U/L (ref 38–126)
Anion gap: 7 (ref 5–15)
BUN: 8 mg/dL (ref 6–20)
CO2: 28 mmol/L (ref 22–32)
Calcium: 9.4 mg/dL (ref 8.9–10.3)
Chloride: 103 mmol/L (ref 101–111)
Creatinine, Ser: 0.64 mg/dL (ref 0.44–1.00)
GFR calc Af Amer: >60 mL/min (ref >60)
GFR calc non Af Amer: >60 mL/min (ref >60)
Glucose, Bld: 133 mg/dL — ABNORMAL HIGH (ref 65–99)
Potassium: 4.1 mmol/L (ref 3.5–5.1)
Sodium: 138 mmol/L (ref 135–145)
Total Bilirubin: 0.5 mg/dL (ref 0.3–1.2)
Total Protein: 6.1 g/dL — ABNORMAL LOW (ref 6.5–8.1)

## 2017-06-16 LAB — HEMOGLOBIN A1C
Hgb A1c MFr Bld: 7.6 % — ABNORMAL HIGH (ref 4.8–5.6)
Mean Plasma Glucose: 171.42 mg/dL

## 2017-06-16 LAB — GLUCOSE, CAPILLARY
Glucose-Capillary: 125 mg/dL — ABNORMAL HIGH (ref 65–99)
Glucose-Capillary: 117 mg/dL — ABNORMAL HIGH (ref 65–99)
Glucose-Capillary: 102 mg/dL — ABNORMAL HIGH (ref 65–99)

## 2017-06-16 LAB — LIPID PANEL
Cholesterol: 133 mg/dL (ref 0–200)
HDL: 36 mg/dL — ABNORMAL LOW (ref >40)
LDL Cholesterol: 68 mg/dL (ref 0–99)
Total CHOL/HDL Ratio: 3.7 RATIO
Triglycerides: 145 mg/dL (ref <150)
VLDL: 29 mg/dL (ref 0–40)

## 2017-06-16 MED ORDER — METOPROLOL TARTRATE 50 MG PO TABS
50.0000 mg | ORAL_TABLET | Freq: Once | ORAL | Status: AC
  Administered 2017-06-16: 50 mg via ORAL
  Filled 2017-06-16: qty 1

## 2017-06-16 MED ORDER — METFORMIN HCL 1000 MG PO TABS: 1000.0000 mg | ORAL_TABLET | Freq: Two times a day (BID) | ORAL | Status: DC

## 2017-06-16 MED ORDER — NITROGLYCERIN 0.4 MG SL SUBL
SUBLINGUAL_TABLET | SUBLINGUAL | Status: AC
  Filled 2017-06-16: qty 2

## 2017-06-16 MED ORDER — IOPAMIDOL (ISOVUE-370) INJECTION 76%
INTRAVENOUS | Status: AC
  Filled 2017-06-16: qty 100

## 2017-06-16 MED ORDER — NITROGLYCERIN 0.4 MG SL SUBL
0.8000 mg | SUBLINGUAL_TABLET | Freq: Once | SUBLINGUAL | Status: AC
  Administered 2017-06-16: 0.8 mg via SUBLINGUAL

## 2017-06-16 MED ORDER — IOPAMIDOL (ISOVUE-370) INJECTION 76%
100.0000 mL | Freq: Once | INTRAVENOUS | Status: AC | PRN
  Administered 2017-06-16: 80 mL via INTRAVENOUS

## 2017-06-16 NOTE — Progress Notes (Addendum)
Progress Note  Patient Name: Morgan [name] Date of Encounter: 06/16/2017  Primary Cardiologist: Dr. Percival [name]  Subjective   Feeling well. No CP or SOB.  Inpatient Medications    Scheduled Meds: . ARIPiprazole  10 mg Oral QHS  . aspirin EC  81 mg Oral Daily  . atorvastatin  10 mg Oral QHS  . dicyclomine  10 mg Oral BID  . DULoxetine  60 mg Oral QHS  . enoxaparin (LOVENOX) injection  40 mg Subcutaneous QHS  . insulin aspart  0-15 Units Subcutaneous TID WC  . metoprolol tartrate  50 mg Oral QHS   And  . metoprolol tartrate  50 mg Oral 60 min Pre-Op  . pantoprazole  40 mg Oral Daily  . temazepam  15 mg Oral QHS   Continuous Infusions:  PRN Meds: acetaminophen, morphine injection, nitroGLYCERIN, ondansetron (ZOFRAN) IV, traMADol   Vital Signs    Vitals:   06/15/17 1534 06/15/17 2146 06/15/17 2147 06/16/17 0559  BP: 116/68 135/60 135/60 133/82  Pulse: 87  79 68  Resp:   20 16  Temp: 98.1 F (36.7 C)  98 F (36.7 C) 98 F (36.7 C)  TempSrc: Oral  Oral Oral  SpO2: 94%  96% 92%  Weight:    187 lb 6.4 oz (85 kg)  Height:        Intake/Output Summary (Last 24 hours) at 06/16/2017 0839 Last data filed at 06/15/2017 2200 Gross per 24 hour  Intake 900 ml  Output -  Net 900 ml   Filed Weights   06/14/17 1234 06/15/17 0643 06/16/17 0559  Weight: 185 lb (83.9 kg) 186 lb 6.4 oz (84.6 kg) 187 lb 6.4 oz (85 kg)    Telemetry    NSR mid 60s - Personally Reviewed  Physical Exam   GEN: No acute distress.  HEENT: Normocephalic, atraumatic, sclera non-icteric. Neck: No JVD or bruits. Cardiac: RRR no murmurs, rubs, or gallops.  Radials/DP/PT 1+ and equal bilaterally.  Respiratory: Clear to auscultation bilaterally. Breathing is unlabored. GI: Soft, nontender, non-distended, BS +x 4. MS: no deformity. Extremities: No clubbing or cyanosis. No edema. Distal pedal pulses are 2+ and equal bilaterally. Neuro:  AAOx3. Follows commands. Psych:  Responds to questions  appropriately with a normal affect.  Labs    Chemistry Recent Labs  Lab 06/14/17 1241 06/14/17 2123 06/16/17 0417  NA 139  --  138  K 4.0  --  4.1  CL 106  --  103  CO2 27  --  28  GLUCOSE 167*  --  133*  BUN 6  --  8  CREATININE 0.66 0.66 0.64  CALCIUM 9.9  --  9.4  PROT  --   --  6.1*  ALBUMIN  --   --  3.4*  AST  --   --  41  ALT  --   --  42  ALKPHOS  --   --  83  BILITOT  --   --  0.5  GFRNONAA >60 >60 >60  GFRAA >60 >60 >60  ANIONGAP 6  --  7     Hematology Recent Labs  Lab 06/14/17 1241 06/14/17 2123  WBC 5.2 6.1  RBC 4.44 4.40  HGB 11.6* 11.7*  HCT 36.2 35.8*  MCV 81.5 81.4  MCH 26.1 26.6  MCHC 32.0 32.7  RDW 14.8 14.7  PLT 182 187    Cardiac Enzymes Recent Labs  Lab 06/14/17 2200 06/15/17 0043 06/15/17 0341  TROPONINI <0.03 <0.03 <0.03  Recent Labs  Lab 06/14/17 1255 06/14/17 1857  TROPIPOC 0.00 0.00     BNPNo results for input(s): BNP, PROBNP in the last 168 hours.   DDimer  Recent Labs  Lab 06/14/17 1853  DDIMER <0.27     Radiology    Dg Chest 2 View  Result Date: 06/14/2017 CLINICAL DATA:  Chest pain and shortness of breath EXAM: CHEST - 2 VIEW COMPARISON:  April 20, 2015 FINDINGS: There is no edema or consolidation. The heart size and pulmonary vascularity are normal. No adenopathy. There is postoperative change in the lower cervical region. No pneumothorax. IMPRESSION: No edema or consolidation. Electronically Signed   By: Lowella Grip III M.D.   On: 06/14/2017 13:00   Nm Myocar Multi W/spect W/wall Motion / Ef  Addendum Date: 06/15/2017    There was no ST segment deviation noted during stress.  The left ventricular ejection fraction is normal (55-65%).  Poor image quality with decreased counts throughout myocardium during stress in comparison to rest images  There is evidence of transient ischemic dilatation with an increased TID of 1.33 which can be seen with multivessel CAD  Consider coronary CTA with FFR     Result Date: 06/15/2017  There was no ST segment deviation noted during stress.  The left ventricular ejection fraction is normal (55-65%).  Poor image quality with decreased counts throughout myocardium during stress in comparison to rest images  There is evidence of transient ischemic dilatation with an increased TID of 1.33  Consider coronary CTA with FFR    Cardiac Studies   See nuc result  Patient Profile     52 y.o. female with DM, HTN, HLD, bipolar disorder, L pelvic kidney, fam hx CAD (father had heart issues starting age 61) who was admitted with CP/L arm pain. Ruled out for MI. Stress test equivocal due to poor quality images, cannot r/o abnormal TID.  Assessment & Plan    1. Chest pain/left arm pain, mostly atypical - continue ASA. HR down to 60s after dose of metoprolol last night -> per orders, give another 50mg  of Lopressor prior to cardiac CT. She did have HR in the 80s this AM but this is while she was up and moving around - it is back to 67 while resting in bed. Dr. Meda [name] is aware to read. CT dept also aware.  2. HTN - losartan held temporarily to allow for beta blockade for CT.  3. Hyperlipidemia - LDL 68. Continue statin. Close f/u with PCP recommended as OP given DM/fam hx.  4. Diabetes mellitus - uncontrolled. She is on Metformin at home which is on hold. Will need to f/u PCP for further management/adjustment.  For questions or updates, please contact Morgan [name] Please consult [email or web address] for contact info under Cardiology/STEMI.  Signed, Morgan Pitter, PA-C 06/16/2017, 8:39 AM    History and all data above reviewed.  Patient examined.  I agree with the findings as above.   No further chest pain.  The patient exam reveals COR:RRR  ,  Lungs: clear  ,  Abd: Positive bowel sounds, no rebound no guarding, Ext No edema  .  All available labs, radiology testing, previous records reviewed. Agree with documented assessment and plan.   Chest pain:  CT angiogram  today.  Pending results probably home with plans for risk reduction.   Morgan [name]  11:42 AM  06/16/2017

## 2017-06-16 NOTE — Progress Notes (Signed)
Discharge instruction was given to pt.  Belongings were sent home with pt.  Jersie Beel, RN 

## 2017-06-16 NOTE — Progress Notes (Signed)
Cardiac CT reviewed - normal cors. Possibly mildly dilated pulmonary artery measuring 31 mm suggestive of pulmonary hypertension. Will arrange OP echocardiogram to assess, along with f/u appt.  OK to resume home BP meds (metoprolol was just for the CT scan). Would hold metformin for 48 hours after scan. Discussed with patient. Also notified Dr. Teryl Lucy of our results.  Carder Yin PA-C

## 2017-06-17 ENCOUNTER — Telehealth: Payer: Self-pay | Admitting: Physician Assistant

## 2017-06-17 NOTE — Telephone Encounter (Signed)
Returned pts call and went over her CT results. See result note.

## 2017-06-17 NOTE — Telephone Encounter (Signed)
New message  Pt verbalized that she is returning call for RN   To go over her results

## 2017-06-22 ENCOUNTER — Other Ambulatory Visit: Payer: Self-pay

## 2017-06-22 ENCOUNTER — Ambulatory Visit (HOSPITAL_COMMUNITY): Payer: Federal, State, Local not specified - PPO | Attending: Cardiology

## 2017-06-22 DIAGNOSIS — Z6835 Body mass index (BMI) 35.0-35.9, adult: Secondary | ICD-10-CM | POA: Insufficient documentation

## 2017-06-22 DIAGNOSIS — I288 Other diseases of pulmonary vessels: Secondary | ICD-10-CM | POA: Insufficient documentation

## 2017-06-22 DIAGNOSIS — I10 Essential (primary) hypertension: Secondary | ICD-10-CM | POA: Diagnosis not present

## 2017-06-22 DIAGNOSIS — E669 Obesity, unspecified: Secondary | ICD-10-CM | POA: Insufficient documentation

## 2017-06-22 DIAGNOSIS — E785 Hyperlipidemia, unspecified: Secondary | ICD-10-CM | POA: Insufficient documentation

## 2017-06-22 DIAGNOSIS — E119 Type 2 diabetes mellitus without complications: Secondary | ICD-10-CM | POA: Diagnosis not present

## 2017-07-13 ENCOUNTER — Ambulatory Visit: Payer: Federal, State, Local not specified - PPO | Admitting: Physician Assistant

## 2017-09-13 ENCOUNTER — Emergency Department
Admission: EM | Admit: 2017-09-13 | Discharge: 2017-09-13 | Disposition: A | Discharge status: Home / Self Care | Payer: Federal, State, Local not specified - PPO | Attending: Emergency Medicine | Admitting: Emergency Medicine

## 2017-09-13 ENCOUNTER — Other Ambulatory Visit: Payer: Self-pay

## 2017-09-13 ENCOUNTER — Emergency Department: Payer: Federal, State, Local not specified - PPO

## 2017-09-13 DIAGNOSIS — Z7984 Long term (current) use of oral hypoglycemic drugs: Secondary | ICD-10-CM | POA: Diagnosis not present

## 2017-09-13 DIAGNOSIS — R112 Nausea with vomiting, unspecified: Secondary | ICD-10-CM | POA: Insufficient documentation

## 2017-09-13 DIAGNOSIS — I10 Essential (primary) hypertension: Secondary | ICD-10-CM | POA: Insufficient documentation

## 2017-09-13 DIAGNOSIS — E119 Type 2 diabetes mellitus without complications: Secondary | ICD-10-CM | POA: Insufficient documentation

## 2017-09-13 DIAGNOSIS — Z7982 Long term (current) use of aspirin: Secondary | ICD-10-CM | POA: Diagnosis not present

## 2017-09-13 DIAGNOSIS — R1031 Right lower quadrant pain: Secondary | ICD-10-CM | POA: Diagnosis not present

## 2017-09-13 DIAGNOSIS — Z79899 Other long term (current) drug therapy: Secondary | ICD-10-CM | POA: Diagnosis not present

## 2017-09-13 LAB — BASIC METABOLIC PANEL
Anion gap: 7 (ref 5–15)
BUN: 13 mg/dL (ref 6–20)
CO2: 30 mmol/L (ref 22–32)
Calcium: 9.3 mg/dL (ref 8.9–10.3)
Chloride: 100 mmol/L (ref 98–111)
Creatinine, Ser: 0.67 mg/dL (ref 0.44–1.00)
GFR calc Af Amer: >60 mL/min (ref >60)
GFR calc non Af Amer: >60 mL/min (ref >60)
Glucose, Bld: 209 mg/dL — ABNORMAL HIGH (ref 70–99)
Potassium: 4 mmol/L (ref 3.5–5.1)
Sodium: 137 mmol/L (ref 135–145)

## 2017-09-13 LAB — URINALYSIS, COMPLETE (UACMP) WITH MICROSCOPIC
Bilirubin Urine: NEGATIVE
Glucose, UA: NEGATIVE mg/dL
Hgb urine dipstick: NEGATIVE
Ketones, ur: NEGATIVE mg/dL
Leukocytes, UA: NEGATIVE
Nitrite: NEGATIVE
Protein, ur: 30 mg/dL — AB
Specific Gravity, Urine: 1.016 (ref 1.005–1.030)
pH: 6 (ref 5.0–8.0)

## 2017-09-13 LAB — CBC
HCT: 39.2 % (ref 35.0–47.0)
Hemoglobin: 13.2 g/dL (ref 12.0–16.0)
MCH: 27.5 pg (ref 26.0–34.0)
MCHC: 33.6 g/dL (ref 32.0–36.0)
MCV: 81.8 fL (ref 80.0–100.0)
Platelets: 211 10*3/uL (ref 150–440)
RBC: 4.8 MIL/uL (ref 3.80–5.20)
RDW: 15.5 % — ABNORMAL HIGH (ref 11.5–14.5)
WBC: 7 10*3/uL (ref 3.6–11.0)

## 2017-09-13 MED ORDER — FENTANYL CITRATE (PF) 100 MCG/2ML IJ SOLN
50.0000 ug | Freq: Once | INTRAMUSCULAR | Status: AC
  Administered 2017-09-13: 50 ug via INTRAVENOUS
  Filled 2017-09-13: qty 2

## 2017-09-13 MED ORDER — SODIUM CHLORIDE 0.9 % IV BOLUS
1000.0000 mL | Freq: Once | INTRAVENOUS | Status: AC
  Administered 2017-09-13: 1000 mL via INTRAVENOUS

## 2017-09-13 MED ORDER — VALACYCLOVIR HCL 1 G PO TABS: 1000.0000 mg | ORAL_TABLET | Freq: Three times a day (TID) | ORAL | 0 refills | Status: AC

## 2017-09-13 MED ORDER — TRAMADOL HCL 50 MG PO TABS: 50.0000 mg | ORAL_TABLET | Freq: Four times a day (QID) | ORAL | 0 refills | Status: DC | PRN

## 2017-09-13 MED ORDER — IOPAMIDOL (ISOVUE-370) INJECTION 76%
75.0000 mL | Freq: Once | INTRAVENOUS | Status: AC | PRN
  Administered 2017-09-13: 75 mL via INTRAVENOUS
  Filled 2017-09-13: qty 75

## 2017-09-13 NOTE — Discharge Instructions (Addendum)
Please seek medical attention for any high fevers, chest pain, shortness of breath, change in behavior, persistent vomiting, bloody stool or any other new or concerning symptoms.  

## 2017-09-13 NOTE — ED Triage Notes (Signed)
Pt arrives via ems from home with complaints of right sided flank pain around side and down back. Pt states pain started Saturday. Pt denies any pain or discomfort with urination. NAD at this time. Pt able to remain still during triage and assessment.

## 2017-09-13 NOTE — ED Provider Notes (Signed)
[facility] Emergency Department Provider Note   ____________________________________________   I have reviewed the triage vital signs and the nursing notes.   HISTORY  Chief Complaint Flank Pain   History limited by: Not Limited   HPI Morgan [name] is a 52 y.o. female who presents to the emergency department today because of concerns for right lower quadrant and right lower back pain.  The pain started 3 days ago.  She stated the pain started gradually.  Has since been constant and more severe.  She denies having pain like this before.  Does have a history of kidney stones but states that that pain was higher up in her flank.  She has had some diarrhea although has not noticed any bloody stool.  Has had some nausea and vomiting.  Denies any fevers.  Denies any change in urination.    Per medical record review patient has a history of DM, HTN  Past Medical History:  Diagnosis Date  . Anxiety   . Bipolar disorder ([place])   . Breast mass    right  . Cervical dysplasia   . Diabetes mellitus without complication ([place])   . Endometriosis   . GERD (gastroesophageal reflux disease)   . Hypercholesteremia   . Hypertension   . Migraine   . Pelvic kidney    left  . Postpartum depression    hx of    Patient Active Problem List   Diagnosis Date Noted  . Chest pain 06/14/2017  . Hypertension complicating diabetes ([place]) 06/14/2017  . Type 2 diabetes mellitus with hyperlipidemia ([place]) 06/14/2017  . Hyperlipidemia associated with type 2 diabetes mellitus ([place]) 06/14/2017  . Cervical dysplasia   . Bipolar disorder [facility])     Past Surgical History:  Procedure Laterality Date  . BACK SURGERY  2014  . BREAST LUMPECTOMY WITH RADIOACTIVE SEED LOCALIZATION Right 10/28/2014   Procedure: RIGHT BREAST LUMPECTOMY WITH RADIOACTIVE SEED LOCALIZATION;  Surgeon: [name], MD;  Location: [place] [facility];  Service: General;  Laterality: Right;  . CESAREAN  SECTION  03  . COLPOSCOPY    . GYNECOLOGIC CRYOSURGERY    . NECK SURGERY  2012  . PELVIC LAPAROSCOPY  89    Prior to Admission medications   Medication Sig Start Date End Date Taking? Authorizing Provider  ARIPiprazole (ABILIFY) 10 MG tablet Take 1 tablet by mouth at bedtime.  08/07/14   [provider]  aspirin EC 81 MG tablet Take 1 tablet (81 mg total) by mouth daily. 06/14/17   Joy, Shawn C, PA-C  atorvastatin (LIPITOR) 10 MG tablet Take 10 mg by mouth at bedtime. 11/04/16   [provider]  dicyclomine (BENTYL) 10 MG capsule Take 10 mg by mouth 2 (two) times daily. 04/25/17   [provider]  DULoxetine (CYMBALTA) 60 MG capsule Take 60 mg by mouth at bedtime. 11/04/16   [provider]  estazolam (PROSOM) 2 MG tablet Take 2 mg by mouth as needed. 04/13/17   [provider]  losartan (COZAAR) 50 MG tablet Take 50 mg by mouth at bedtime.     [provider]  metFORMIN (GLUCOPHAGE) 1000 MG tablet Take 1 tablet (1,000 mg total) by mouth 2 (two) times daily. 06/19/17   Mariel Aloe, MD  omeprazole (PRILOSEC) 20 MG capsule Take 20 mg by mouth at bedtime.     [provider]  traMADol (ULTRAM) 50 MG tablet Take 50 mg by mouth every 6 (six) hours as needed  for pain. 11/15/16   [provider]    Allergies Lamotrigine  Family History  Problem Relation Age of Onset  . Diabetes Mother   . Breast cancer Mother 31  . Thyroid disease Mother   . Depression Mother   . Hypertension Father   . Heart disease Father   . Cancer Father        Throat cancer  . Heart attack Father 21       Two instances, first at age 9  . Stroke Father   . Diabetes Maternal Grandmother   . Cancer Maternal Grandmother        uterine  . Depression Maternal Grandmother     Social History Social History   Tobacco Use  . Smoking status: Never Smoker  . Smokeless tobacco: Never Used  Substance Use Topics  . Alcohol use: No  . Drug use: No     Review of Systems Constitutional: No fever/chills Eyes: No visual changes. ENT: No sore throat. Cardiovascular: Denies chest pain. Respiratory: Denies shortness of breath. Gastrointestinal: Positive for right lower quadrant, right lower back pain. Genitourinary: Negative for dysuria. Musculoskeletal: Negative for back pain. Skin: Negative for rash. Neurological: Negative for headaches, focal weakness or numbness.  ____________________________________________   PHYSICAL EXAM:  VITAL SIGNS: ED Triage Vitals  Enc Vitals Group     BP 09/13/17 1154 137/86     Pulse Rate 09/13/17 1154 85     Resp 09/13/17 1154 18     Temp 09/13/17 1154 98.2 F (36.8 C)     Temp Source 09/13/17 1154 Oral     SpO2 09/13/17 1154 97 %     Weight 09/13/17 1155 185 lb (83.9 kg)     Height 09/13/17 1155 5\' 1"  (1.549 m)     Head Circumference --      Peak Flow --      Pain Score 09/13/17 1210 7    Constitutional: Alert and oriented.  Eyes: Conjunctivae are normal.  ENT      Head: Normocephalic and atraumatic.      Nose: No congestion/rhinnorhea.      Mouth/Throat: Mucous membranes are moist.      Neck: No stridor. Hematological/Lymphatic/Immunilogical: No cervical lymphadenopathy. Cardiovascular: Normal rate, regular rhythm.  No murmurs, rubs, or gallops.  Respiratory: Normal respiratory effort without tachypnea nor retractions. Breath sounds are clear and equal bilaterally. No wheezes/rales/rhonchi. Gastrointestinal: Soft and tender to palpation over mcburney's point. Genitourinary: Deferred Musculoskeletal: Normal range of motion in all extremities. No lower extremity edema. Neurologic:  Normal speech and language. No gross focal neurologic deficits are appreciated.  Skin:  Skin is warm, dry and intact. No rash noted. Psychiatric: Mood and affect are normal. Speech and behavior are normal. Patient exhibits appropriate insight and judgment.  ____________________________________________     LABS (pertinent positives/negatives)  UA clear, negative dipstick, nitrite, leukocytes, 0-5 rbc and wbc, rare bacteria BMP wnl except glu 209 CBC wbc 7.0, hgb 13.2, plt 211  ____________________________________________   EKG  None  ____________________________________________    RADIOLOGY  CT abd/pel No appendicitis, some fatty liver, no other explanation for the pain  ____________________________________________   PROCEDURES  Procedures  ____________________________________________   INITIAL IMPRESSION / ASSESSMENT AND PLAN / ED COURSE  Pertinent labs & imaging results that were available during my care of the patient were reviewed by me and considered in my medical decision making (see chart for details).   Patient presented to the emergency department today because of concerns for right  lower quadrant pain.  Differential would include ovarian disease, appendicitis, gastroenteritis, hernia amongst other etiologies.  CT scan does show some fatty liver but no other explanation for the patient pains.  Discussed blood work and imaging findings with the patient.  This point would also consider possible shingles.  Did discuss patient we would give her a prescription for Valtrex however she does not need to fill it unless rash erupts.  ____________________________________________   FINAL CLINICAL IMPRESSION(S) / ED DIAGNOSES  Final diagnoses:  Right lower quadrant pain     Note: This dictation was prepared with Dragon dictation. Any transcriptional errors that result from this process are unintentional     Nance Pear, MD 09/13/17 [phone]

## 2017-09-22 LAB — HM DIABETES EYE EXAM

## 2018-02-13 ENCOUNTER — Other Ambulatory Visit: Payer: Self-pay | Admitting: Women's Health

## 2018-02-13 DIAGNOSIS — Z1231 Encounter for screening mammogram for malignant neoplasm of breast: Secondary | ICD-10-CM

## 2018-02-15 HISTORY — PX: BREAST BIOPSY: SHX20

## 2018-03-12 ENCOUNTER — Encounter: Payer: Self-pay | Admitting: Emergency Medicine

## 2018-03-12 ENCOUNTER — Emergency Department
Admission: EM | Admit: 2018-03-12 | Discharge: 2018-03-12 | Disposition: A | Discharge status: Home / Self Care | Payer: Federal, State, Local not specified - PPO | Attending: Emergency Medicine | Admitting: Emergency Medicine

## 2018-03-12 ENCOUNTER — Emergency Department: Payer: Federal, State, Local not specified - PPO

## 2018-03-12 ENCOUNTER — Other Ambulatory Visit: Payer: Self-pay

## 2018-03-12 DIAGNOSIS — R109 Unspecified abdominal pain: Secondary | ICD-10-CM

## 2018-03-12 DIAGNOSIS — R1031 Right lower quadrant pain: Secondary | ICD-10-CM | POA: Diagnosis present

## 2018-03-12 DIAGNOSIS — M545 Low back pain: Secondary | ICD-10-CM | POA: Diagnosis not present

## 2018-03-12 DIAGNOSIS — E1169 Type 2 diabetes mellitus with other specified complication: Secondary | ICD-10-CM | POA: Diagnosis not present

## 2018-03-12 DIAGNOSIS — E785 Hyperlipidemia, unspecified: Secondary | ICD-10-CM | POA: Insufficient documentation

## 2018-03-12 DIAGNOSIS — I10 Essential (primary) hypertension: Secondary | ICD-10-CM | POA: Diagnosis not present

## 2018-03-12 LAB — URINALYSIS, COMPLETE (UACMP) WITH MICROSCOPIC
Bacteria, UA: NONE SEEN
Bilirubin Urine: NEGATIVE
Glucose, UA: >=500 mg/dL — AB
Hgb urine dipstick: NEGATIVE
Ketones, ur: NEGATIVE mg/dL
Leukocytes, UA: NEGATIVE
Nitrite: NEGATIVE
Protein, ur: NEGATIVE mg/dL
Specific Gravity, Urine: 1.025 (ref 1.005–1.030)
pH: 5 (ref 5.0–8.0)

## 2018-03-12 LAB — BASIC METABOLIC PANEL
Anion gap: 5 (ref 5–15)
BUN: 9 mg/dL (ref 6–20)
CO2: 29 mmol/L (ref 22–32)
Calcium: 9.1 mg/dL (ref 8.9–10.3)
Chloride: 104 mmol/L (ref 98–111)
Creatinine, Ser: 0.54 mg/dL (ref 0.44–1.00)
GFR calc Af Amer: >60 mL/min (ref >60)
GFR calc non Af Amer: >60 mL/min (ref >60)
Glucose, Bld: 194 mg/dL — ABNORMAL HIGH (ref 70–99)
Potassium: 3.7 mmol/L (ref 3.5–5.1)
Sodium: 138 mmol/L (ref 135–145)

## 2018-03-12 LAB — CBC WITH DIFFERENTIAL/PLATELET
Abs Immature Granulocytes: 0.03 10*3/uL (ref 0.00–0.07)
Basophils Absolute: 0.1 10*3/uL (ref 0.0–0.1)
Basophils Relative: 1 %
Eosinophils Absolute: 0.4 10*3/uL (ref 0.0–0.5)
Eosinophils Relative: 7 %
HCT: 36 % (ref 36.0–46.0)
Hemoglobin: 11.6 g/dL — ABNORMAL LOW (ref 12.0–15.0)
Immature Granulocytes: 1 %
Lymphocytes Relative: 38 %
Lymphs Abs: 2.2 10*3/uL (ref 0.7–4.0)
MCH: 26.5 pg (ref 26.0–34.0)
MCHC: 32.2 g/dL (ref 30.0–36.0)
MCV: 82.2 fL (ref 80.0–100.0)
Monocytes Absolute: 0.2 10*3/uL (ref 0.1–1.0)
Monocytes Relative: 4 %
Neutro Abs: 3 10*3/uL (ref 1.7–7.7)
Neutrophils Relative %: 49 %
Platelets: 182 10*3/uL (ref 150–400)
RBC: 4.38 MIL/uL (ref 3.87–5.11)
RDW: 14.3 % (ref 11.5–15.5)
WBC: 5.9 10*3/uL (ref 4.0–10.5)
nRBC: 0 % (ref 0.0–0.2)

## 2018-03-12 MED ORDER — DIAZEPAM 5 MG PO TABS: 5.0000 mg | ORAL_TABLET | Freq: Three times a day (TID) | ORAL | 0 refills | Status: DC | PRN

## 2018-03-12 MED ORDER — KETOROLAC TROMETHAMINE 30 MG/ML IJ SOLN
30.0000 mg | Freq: Once | INTRAMUSCULAR | Status: AC
  Administered 2018-03-12: 30 mg via INTRAVENOUS
  Filled 2018-03-12: qty 1

## 2018-03-12 MED ORDER — KETOROLAC TROMETHAMINE 10 MG PO TABS: 10.0000 mg | ORAL_TABLET | Freq: Four times a day (QID) | ORAL | 0 refills | Status: DC | PRN

## 2018-03-12 NOTE — ED Provider Notes (Signed)
[facility] Emergency Department Provider Note       Time seen: ----------------------------------------- 5:26 PM on 03/12/2018 -----------------------------------------   I have reviewed the triage vital signs and the nursing notes.  HISTORY   Chief Complaint Flank Pain    HPI Morgan [name] is a 53 y.o. female with a history of anxiety, bipolar disorder, diabetes, hyperlipidemia, hypertension, migraines, postpartum depression who presents to the ED for right-sided flank pain.  She reports right flank pain that radiates to her right abdomen that woke her from sleep this morning.  Patient also reports some nausea, attributes the symptoms to a kidney stone.  Past Medical History:  Diagnosis Date  . Anxiety   . Bipolar disorder ([place])   . Breast mass    right  . Cervical dysplasia   . Diabetes mellitus without complication ([place])   . Endometriosis   . GERD (gastroesophageal reflux disease)   . Hypercholesteremia   . Hypertension   . Migraine   . Pelvic kidney    left  . Postpartum depression    hx of    Patient Active Problem List   Diagnosis Date Noted  . Chest pain 06/14/2017  . Hypertension complicating diabetes ([place]) 06/14/2017  . Type 2 diabetes mellitus with hyperlipidemia ([place]) 06/14/2017  . Hyperlipidemia associated with type 2 diabetes mellitus ([place]) 06/14/2017  . Cervical dysplasia   . Bipolar disorder [facility])     Past Surgical History:  Procedure Laterality Date  . BACK SURGERY  2014  . BREAST LUMPECTOMY WITH RADIOACTIVE SEED LOCALIZATION Right 10/28/2014   Procedure: RIGHT BREAST LUMPECTOMY WITH RADIOACTIVE SEED LOCALIZATION;  Surgeon: [name], MD;  Location: [place];  Service: General;  Laterality: Right;  . CESAREAN SECTION  03  . COLPOSCOPY    . GYNECOLOGIC CRYOSURGERY    . NECK SURGERY  2012  . PELVIC LAPAROSCOPY  91    Allergies Lamotrigine  Social History Social History   Tobacco Use  .  Smoking status: Never Smoker  . Smokeless tobacco: Never Used  Substance Use Topics  . Alcohol use: No  . Drug use: No   Review of Systems Constitutional: Negative for fever. Cardiovascular: Negative for chest pain. Respiratory: Negative for shortness of breath. Gastrointestinal: Positive for flank pain Musculoskeletal: Positive for back pain Skin: Negative for rash. Neurological: Negative for headaches, focal weakness or numbness.  All systems negative/normal/unremarkable except as stated in the HPI  ____________________________________________   PHYSICAL EXAM:  VITAL SIGNS: ED Triage Vitals [03/12/18 1702]  Enc Vitals Group     BP (!) 175/84     Pulse Rate 99     Resp 18     Temp 98.2 F (36.8 C)     Temp Source Oral     SpO2 97 %     Weight 180 lb (81.6 kg)     Height 5\' 1"  (1.549 m)     Head Circumference      Peak Flow      Pain Score 9     Pain Loc      Pain Edu?      Excl. in [place]?    Constitutional: Alert and oriented. Well appearing and in no distress. Cardiovascular: Normal rate, regular rhythm. No murmurs, rubs, or gallops. Respiratory: Normal respiratory effort without tachypnea nor retractions. Breath sounds are clear and equal bilaterally. No wheezes/rales/rhonchi. Gastrointestinal: Mild right flank tenderness, no rebound or guarding.  Normal bowel sounds. Musculoskeletal: Nontender with normal range of motion  in extremities. No lower extremity tenderness nor edema. Neurologic:  Normal speech and language. No gross focal neurologic deficits are appreciated.  Skin:  Skin is warm, dry and intact. No rash noted. Psychiatric: Mood and affect are normal. Speech and behavior are normal.  ____________________________________________  ED COURSE:  As part of my medical decision making, I reviewed the following data within the [place] History obtained from family if available, nursing notes, old chart and ekg, as well as notes from prior ED  visits. Patient presented for flank pain, we will assess with labs and imaging as indicated at this time.   Procedures ____________________________________________   LABS (pertinent positives/negatives)  Labs Reviewed  URINALYSIS, COMPLETE (UACMP) WITH MICROSCOPIC - Abnormal; Notable for the following components:      Result Value   Color, Urine YELLOW (*)    APPearance CLEAR (*)    Glucose, UA >=500 (*)    All other components within normal limits  CBC WITH DIFFERENTIAL/PLATELET - Abnormal; Notable for the following components:   Hemoglobin 11.6 (*)    All other components within normal limits  BASIC METABOLIC PANEL - Abnormal; Notable for the following components:   Glucose, Bld 194 (*)    All other components within normal limits    RADIOLOGY  KUB  IMPRESSION: 1. No significant abnormality is radiographically apparent. ____________________________________________   DIFFERENTIAL DIAGNOSIS   Renal colic, muscle strain, spasm, chronic pain  FINAL ASSESSMENT AND PLAN  Flank pain   Plan: The patient had presented for flank pain. Patient's labs are reassuring without hematuria. Patient's imaging not reveal any acute process.  This is likely not related to renal colic or to the urinary tract.  She will be discharged with anti-inflammatory muscle relaxants.   Laurence Aly, MD    Note: This note was generated in part or whole with voice recognition software. Voice recognition is usually quite accurate but there are transcription errors that can and very often do occur. I apologize for any typographical errors that were not detected and corrected.     Earleen Newport, MD 03/12/18 Drema [place]on

## 2018-03-12 NOTE — ED Triage Notes (Signed)
PT arrives with reports of right flank pain that radiates to her lower right abdomen that woke her from her sleep this morning. PT also reports nausea. Pt reports similar episode previously and was attributed to a kidney stone. Pt states she took a tramadol at 1330 with minimal to no relief.

## 2018-03-15 ENCOUNTER — Other Ambulatory Visit: Payer: Self-pay | Admitting: Family Medicine

## 2018-03-15 ENCOUNTER — Ambulatory Visit
Admission: RE | Admit: 2018-03-15 | Discharge: 2018-03-15 | Disposition: A | Discharge status: Home / Self Care | Payer: Federal, State, Local not specified - PPO | Source: Ambulatory Visit | Attending: Family Medicine | Admitting: Family Medicine

## 2018-03-15 DIAGNOSIS — R109 Unspecified abdominal pain: Secondary | ICD-10-CM

## 2018-03-16 ENCOUNTER — Ambulatory Visit: Payer: Federal, State, Local not specified - PPO

## 2018-03-20 ENCOUNTER — Encounter: Payer: Self-pay | Admitting: Family Medicine

## 2018-03-21 ENCOUNTER — Telehealth: Payer: Self-pay | Admitting: Family Medicine

## 2018-03-21 ENCOUNTER — Ambulatory Visit (INDEPENDENT_AMBULATORY_CARE_PROVIDER_SITE_OTHER): Payer: Federal, State, Local not specified - PPO | Admitting: Family Medicine

## 2018-03-21 ENCOUNTER — Encounter: Payer: Self-pay | Admitting: Family Medicine

## 2018-03-21 VITALS — BP 166/89 | HR 96 | Resp 17 | Ht 63.0 in | Wt 184.6 lb

## 2018-03-21 DIAGNOSIS — I10 Essential (primary) hypertension: Secondary | ICD-10-CM

## 2018-03-21 DIAGNOSIS — R160 Hepatomegaly, not elsewhere classified: Secondary | ICD-10-CM

## 2018-03-21 DIAGNOSIS — Z7689 Persons encountering health services in other specified circumstances: Secondary | ICD-10-CM

## 2018-03-21 DIAGNOSIS — D376 Neoplasm of uncertain behavior of liver, gallbladder and bile ducts: Secondary | ICD-10-CM | POA: Diagnosis not present

## 2018-03-21 DIAGNOSIS — R109 Unspecified abdominal pain: Secondary | ICD-10-CM | POA: Diagnosis not present

## 2018-03-21 DIAGNOSIS — E1159 Type 2 diabetes mellitus with other circulatory complications: Secondary | ICD-10-CM

## 2018-03-21 DIAGNOSIS — E1169 Type 2 diabetes mellitus with other specified complication: Secondary | ICD-10-CM | POA: Diagnosis not present

## 2018-03-21 DIAGNOSIS — E785 Hyperlipidemia, unspecified: Secondary | ICD-10-CM

## 2018-03-21 DIAGNOSIS — I152 Hypertension secondary to endocrine disorders: Secondary | ICD-10-CM

## 2018-03-21 DIAGNOSIS — N200 Calculus of kidney: Secondary | ICD-10-CM

## 2018-03-21 LAB — POCT URINALYSIS DIP (CLINITEK)
Bilirubin, UA: NEGATIVE
Blood, UA: NEGATIVE
Glucose, UA: NEGATIVE mg/dL
Ketones, POC UA: NEGATIVE mg/dL
Leukocytes, UA: NEGATIVE
Nitrite, UA: NEGATIVE
POC PROTEIN,UA: NEGATIVE
Spec Grav, UA: 1.02 (ref 1.010–1.025)
Urobilinogen, UA: 0.2 E.U./dL
pH, UA: 7 (ref 5.0–8.0)

## 2018-03-21 LAB — GLUCOSE, POCT (MANUAL RESULT ENTRY): POC Glucose: 87 mg/dl (ref 70–99)

## 2018-03-21 MED ORDER — TAMSULOSIN HCL 0.4 MG PO CAPS: 0.4000 mg | ORAL_CAPSULE | Freq: Every day | ORAL | 0 refills | Status: DC

## 2018-03-21 MED ORDER — CYCLOBENZAPRINE HCL 5 MG PO TABS: 5.0000 mg | ORAL_TABLET | Freq: Three times a day (TID) | ORAL | 1 refills | Status: DC | PRN

## 2018-03-21 MED ORDER — LOSARTAN POTASSIUM 100 MG PO TABS: ORAL_TABLET | ORAL | 2 refills | Status: DC

## 2018-03-21 NOTE — Telephone Encounter (Signed)
Please order MR liver. See order and notify patient once MRI is scheduled.

## 2018-03-21 NOTE — Progress Notes (Signed)
Morgan [name], is a 53 y.o. female  WIO:[id number]  EQA:[id number]  DOB - [birth year]  CC:  Chief Complaint  Patient presents with  . Establish Care  . Abdominal Pain  . Back Pain       HPI: Morgan [name] is a 53 y.o. female is here today to establish care and evaluation of recent abnormal CT of Abdomen.  Morgan [name] has Bipolar disorder ([place]); Cervical dysplasia; Chest pain; Hypertension complicating diabetes ([place]); Type 2 diabetes mellitus with hyperlipidemia (HCC); and Hyperlipidemia associated with type 2 diabetes mellitus ([place]) on their problem list.    Presents with a complaint of right-sided flank pain radiating to the right upper abdomen.  Patient has been seen at [facility] ER on 03/12/2018.  During that visit a CT of the abdomen pelvis was ordered.  CT confirmed the presence of bilateral renal stones.  Patient was advised to follow-up here for further management. Patient was also noted back in July 2019 to have bilateral renal stones but reports pain resolved and she is uncertain if she actually passed any stones.  She has had no follow-up with urology on either occasion. She was prescribed Valium for the back pain during her recent ER visit and reports this is not decrease the pain.  She denies any fever, nausea, vomiting, or diarrhea. Of note recent CT study did indicate the presence of a 20 mm right lobe liver mass.  Further evaluation was recommended by nonemergent MRI.  She has no history of liver disease or alcohol abuse.  Current medications: Current Outpatient Medications:  .  ARIPiprazole (ABILIFY) 15 MG tablet, Take 1 tablet by mouth at bedtime., Disp: , Rfl:  .  atorvastatin (LIPITOR) 10 MG tablet, Take 10 mg by mouth at bedtime., Disp: , Rfl: 5 .  diazepam (VALIUM) 5 MG tablet, Take 1 tablet (5 mg total) by mouth every 8 (eight) hours as needed for muscle spasms., Disp: 20 tablet, Rfl: 0 .  DULoxetine (CYMBALTA) 60 MG capsule, Take 60 mg by mouth at bedtime.,  Disp: , Rfl: 5 .  ketorolac (TORADOL) 10 MG tablet, Take 1 tablet (10 mg total) by mouth every 6 (six) hours as needed., Disp: 20 tablet, Rfl: 0 .  losartan (COZAAR) 50 MG tablet, Take 50 mg by mouth at bedtime. , Disp: , Rfl:  .  metFORMIN (GLUCOPHAGE) 1000 MG tablet, Take 1 tablet (1,000 mg total) by mouth 2 (two) times daily., Disp: , Rfl:  .  omeprazole (PRILOSEC) 20 MG capsule, Take 20 mg by mouth at bedtime. , Disp: , Rfl:    Pertinent family medical history: family history includes Alcohol abuse in her father; Breast cancer (age of onset: 81) in her mother; Depression in her maternal grandmother and mother; Diabetes in her maternal grandmother and mother; Heart attack (age of onset: 77) in her father; Heart disease in her father; Hypertension in her father; Stroke in her father; Throat cancer in her father; Thyroid disease in her mother; Uterine cancer in her maternal grandmother.   Allergies  Allergen Reactions  . Lamotrigine Rash    Social History   Socioeconomic History  . Marital status: Married    Spouse name: Not on file  . Number of children: Not on file  . Years of education: Not on file  . Highest education level: Not on file  Occupational History  . Not on file  Social Needs  . Financial resource strain: Not on file  . Food insecurity:    Worry: Not on  file    Inability: Not on file  . Transportation needs:    Medical: Not on file    Non-medical: Not on file  Tobacco Use  . Smoking status: Never Smoker  . Smokeless tobacco: Never Used  Substance and Sexual Activity  . Alcohol use: No  . Drug use: No  . Sexual activity: Yes    Birth control/protection: Other-see comments    Comment: vasectomy  Lifestyle  . Physical activity:    Days per week: Not on file    Minutes per session: Not on file  . Stress: Not on file  Relationships  . Social connections:    Talks on phone: Not on file    Gets together: Not on file    Attends religious service: Not on file     Active member of club or organization: Not on file    Attends meetings of clubs or organizations: Not on file    Relationship status: Not on file  . Intimate partner violence:    Fear of current or ex partner: Not on file    Emotionally abused: Not on file    Physically abused: Not on file    Forced sexual activity: Not on file  Other Topics Concern  . Not on file  Social History Narrative  . Not on file    Review of Systems: Pertinent negatives listed in HPI Objective:   Vitals:   03/21/18 1340  BP: (!) 166/89  Pulse: 96  Resp: 17  SpO2: 98%    BP Readings from Last 3 Encounters:  03/21/18 (!) 166/89  03/12/18 (!) 155/89  09/13/17 132/78    Filed Weights   03/21/18 1340  Weight: 184 lb 9.6 oz (83.7 kg)      Physical Exam: Constitutional: Patient appears well-developed and well-nourished. No distress. HENT: Normocephalic, atraumatic, External right and left ear normal.  Eyes: Conjunctivae and EOM are normal. PERRLA, no scleral icterus. Neck: Normal ROM. Neck supple. No JVD. No tracheal deviation. No thyromegaly. CVS: RRR, S1/S2 +, no murmurs, no gallops, no carotid bruit.  Pulmonary: Effort and breath sounds normal, no stridor, rhonchi, wheezes, rales.  Abdominal: Soft. BS +, no distension, tenderness, rebound or guarding.  Musculoskeletal: Normal range of motion. No edema and no tenderness.  Neuro: Alert. Normal muscle tone coordination. Normal gait. BUE and BLE strength 5/5. Bilateral hand grips symmetrical. Skin: Skin is warm and dry. No rash noted. Not diaphoretic. No erythema. No pallor. Psychiatric: Normal mood and affect. Behavior, judgment, thought content normal.  Lab Results (prior encounters)  Lab Results  Component Value Date   WBC 5.9 03/12/2018   HGB 11.6 (L) 03/12/2018   HCT 36.0 03/12/2018   MCV 82.2 03/12/2018   PLT 182 03/12/2018   Lab Results  Component Value Date   CREATININE 0.54 03/12/2018   BUN 9 03/12/2018   NA 138 03/12/2018    K 3.7 03/12/2018   CL 104 03/12/2018   CO2 29 03/12/2018    Lab Results  Component Value Date   HGBA1C 7.6 (H) 06/16/2017       Component Value Date/Time   CHOL 133 06/16/2017 0417   TRIG 145 06/16/2017 0417   HDL 36 (L) 06/16/2017 0417   CHOLHDL 3.7 06/16/2017 0417   VLDL 29 06/16/2017 0417   LDLCALC 68 06/16/2017 0417        Assessment and plan:  1. Encounter to establish care 2. Liver mass, right lobe, 20 mm Will proceed with obtaining a  MR  LIVER W WO CONTRAST to rule out malignancy.  3. Hypertension complicating diabetes ([place]), uncontrolled today Will increase losartan today as BP was elevated during recent ER visit also. We have discussed target BP range and blood pressure goal. I have advised patient to check BP regularly and to call us back or report to clinic if the numbers are consistently higher than 140/90. We discussed the importance of compliance with medical therapy and DASH diet recommended, consequences of uncontrolled hypertension discussed.  - POCT URINALYSIS DIP (CLINITEK)  4. Type 2 diabetes mellitus with hyperlipidemia (HCC) -Glucose (CBG), 87 Last A1C 7.6, 9 months prior. Continue Metformin Will have patient schedule a diabetes follow-up    Return in about 4 weeks (around 04/18/2018) for Evaluation of back pain and chronic condition follow-up..   The patient was given clear instructions to go to ER or return to medical center if symptoms don't improve, worsen or new problems develop. The patient verbalized understanding. The patient was advised  to call and obtain lab results if they haven't heard anything from out office within 7-10 business days.  Molli Barrows, FNP Primary Care at [facility] [address] [place] [address] [phone]: [phone]    This note has been created with Dragon speech recognition software and Engineer, materials. Any transcriptional errors are unintentional.

## 2018-03-21 NOTE — Patient Instructions (Addendum)
Thank you for choosing Primary Care at [facility] to be your medical home!    Morgan [name] was seen by Molli Barrows, FNP today.   Morgan [name]'s primary care provider is Scot Jun, FNP.   For the best care possible, you should try to see Molli Barrows, FNP-C whenever you come to the clinic.   We look forward to seeing you again soon!  If you have any questions about your visit today, please call us at [phone] or feel free to reach your primary care provider via [place].      I have increased your losartan 100 mg to control your blood pressure. For pain I have prescribed cyclobenzaprine 5 mg up to 3 times a day as needed for back pain. Can continue ibuprofen however only take 400 mg twice daily as needed for back pain.  I recommend Tylenol 500 mg every 6 hours between doses of ibuprofen and cyclobenzaprine for breakthrough pain.  Remember  to urinate and strainer and retain any stones if passed.  Take stones to urology appointment.  I have ordered the MRI of the liver to evaluate the liver mass that was seen on your recent CT study.  You will be contacted regarding the date and time that the study has been scheduled.

## 2018-03-23 ENCOUNTER — Telehealth: Payer: Self-pay | Admitting: Family Medicine

## 2018-03-23 NOTE — Telephone Encounter (Signed)
Called patient's insurance. CPT [phone] does not require preauthorization as long as it's done outpatient.

## 2018-03-23 NOTE — Telephone Encounter (Signed)
[facility] called to inform us that they received the referral and notes and that the patient already has an appointment on 03/24/2018.

## 2018-03-23 NOTE — Telephone Encounter (Signed)
MRI scheduled for 3:30 pm on 04/04/2018. Patient can't have anything to eat for 4 hrs prior to exam.  Can you notify patient please?

## 2018-03-23 NOTE — Telephone Encounter (Signed)
Left voice message for patient to call back

## 2018-03-23 NOTE — Telephone Encounter (Signed)
Please schedule MRI Liver with/without contrast Notify patient once schedule. Also please following up with [facility] to see if they access referrals through the work que or do we need to fax note and referral.

## 2018-03-24 ENCOUNTER — Ambulatory Visit
Admission: RE | Admit: 2018-03-24 | Discharge: 2018-03-24 | Disposition: A | Discharge status: Home / Self Care | Payer: Federal, State, Local not specified - PPO | Source: Ambulatory Visit | Attending: Urology | Admitting: Urology

## 2018-03-24 ENCOUNTER — Encounter: Payer: Self-pay | Admitting: Urology

## 2018-03-24 ENCOUNTER — Ambulatory Visit (INDEPENDENT_AMBULATORY_CARE_PROVIDER_SITE_OTHER): Payer: Federal, State, Local not specified - PPO | Admitting: Urology

## 2018-03-24 VITALS — BP 149/91 | HR 99 | Ht 63.0 in | Wt 185.7 lb

## 2018-03-24 DIAGNOSIS — N200 Calculus of kidney: Secondary | ICD-10-CM

## 2018-03-24 NOTE — Progress Notes (Signed)
03/24/2018 11:58 AM   Morgan [name] May 11, 1965 [id number]  Referring provider: Scot Jun, [place] [place] [place], [place] [address]  CC: Abdominal pain  HPI: I saw Morgan [name] in urology clinic today in consultation for kidney stones from Molli Barrows, NP.  She is a 53 year old female with depression, anxiety, bipolar disorder, and diabetes who has a 3 to 4-year history of intermittent dull abdominal pain with bilateral flank pain.  There are no aggravating or alleviating factors, and the pain comes and goes.  When it is at its worst, she rates it as moderate to severe.  She has never passed a kidney stone before.  She was recently seen in the ED for work-up of this abdominal pain and a CT scan demonstrated small, nonobstructive renal stones.  She does have a left pelvic kidney with 2 small punctate lower pole stones, and the right kidney is in normal position with a 3 mm lower pole stone.  There are no ureteral stones or hydronephrosis.  She has a family history of kidney stones in her brother and dad.  She reports loss of appetite and occasional nausea. She denies any gross hematuria or weight loss.   On her prior CTs, these small non-obstructive kidney stones are unchanged over the last 4 years.   PMH: Past Medical History:  Diagnosis Date  . Anxiety   . Bipolar disorder ([place])   . Breast mass    right  . Cervical dysplasia   . Diabetes mellitus without complication ([place])   . Endometriosis   . GERD (gastroesophageal reflux disease)   . Hypercholesteremia   . Hypertension   . Migraine   . Pelvic kidney    left  . Postpartum depression    hx of    Surgical History: Past Surgical History:  Procedure Laterality Date  . BACK SURGERY  2014  . BREAST LUMPECTOMY WITH RADIOACTIVE SEED LOCALIZATION Right 10/28/2014   Procedure: RIGHT BREAST LUMPECTOMY WITH RADIOACTIVE SEED LOCALIZATION;  Surgeon: [name], MD;  Location: [place];   Service: General;  Laterality: Right;  . CESAREAN SECTION  03  . COLPOSCOPY    . GYNECOLOGIC CRYOSURGERY    . NECK SURGERY  2012  . PELVIC LAPAROSCOPY  91    Allergies:  Allergies  Allergen Reactions  . Lamotrigine Rash    Family History: Family History  Problem Relation Age of Onset  . Diabetes Mother   . Breast cancer Mother 78  . Thyroid disease Mother   . Depression Mother   . Hypertension Father   . Heart disease Father   . Heart attack Father 28       Two instances, first at age 14  . Stroke Father   . Alcohol abuse Father   . Throat cancer Father   . Diabetes Maternal Grandmother   . Depression Maternal Grandmother   . Uterine cancer Maternal Grandmother     Social History:  reports that she has never smoked. She has never used smokeless tobacco. She reports that she does not drink alcohol or use drugs.  ROS: Please see flowsheet from today's date for complete review of systems.  Physical Exam: BP (!) 149/91 (BP Location: Left Arm, Patient Position: Sitting, Cuff Size: Normal)   Pulse 99   Ht '5\' 3"'  (1.6 m)   Wt 185 lb 11.2 oz (84.2 kg)   LMP 05/12/2011   BMI 32.90 kg/m    Constitutional:  Alert and oriented, No  acute distress. Cardiovascular: No clubbing, cyanosis, or edema. Respiratory: Normal respiratory effort, no increased work of breathing. GI: Abdomen is soft, nontender, nondistended, no abdominal masses GU: No CVA tenderness Lymph: No cervical or inguinal lymphadenopathy. Skin: No rashes, bruises or suspicious lesions. Neurologic: Grossly intact, no focal deficits, moving all 4 extremities. Psychiatric: Normal mood and affect.  Laboratory Data: Creatinine 0.54, EGFR greater than 60  Urinalysis 03/12/2018, no bacteria, nitrite negative, 0-5 RBCs, 0-5 WBCs  Pertinent Imaging: I have personally reviewed the CT abdomen pelvis dated 03/15/2018.  Left pelvic kidney.  No hydronephrosis or ureteral stones.  2 small punctate left lower pole stones,  stable for 4+ years.  Right 3 mm lower pole stone, also stable.  Assessment & Plan:   In summary, the patient is a 53 year old female with stable nonobstructive small nephrolithiasis and chronic abdominal pain.  I counseled the patient extensively that I do not think her symptoms are related to her non-obstructive small kidney stones.  These have been stable for 4 to 5 years on cross-sectional imaging.  We discussed if she continues to have severe right-sided flank pain we could consider intervention for the right-sided stone, however I again counseled her extensively that I do not feel this is the etiology of her pain.  We discussed options including ureteroscopy versus shockwave lithotripsy.  She will obtain a KUB today to see if the stone is visible for SWL.  -Very low suspicion that non-obstructing stones or cause of her abdominal pain -We will call with KUB results, consider SWL in the future if persistent abdominal pain without other etiology identified  B[place]ley Co, Jessie [address] [place], [place] [address] [phone]

## 2018-03-24 NOTE — Patient Instructions (Signed)
Dietary Guidelines to Help Prevent Kidney Stones Kidney stones are deposits of minerals and salts that form inside your kidneys. Your risk of developing kidney stones may be greater depending on your diet, your lifestyle, the medicines you take, and whether you have certain medical conditions. Most people can reduce their chances of developing kidney stones by following the instructions below. Depending on your overall health and the type of kidney stones you tend to develop, your dietitian may give you more specific instructions. What are tips for following this plan? Reading food labels  Choose foods with "no salt added" or "low-salt" labels. Limit your sodium intake to less than 1500 mg per day.  Choose foods with calcium for each meal and snack. Try to eat about 300 mg of calcium at each meal. Foods that contain 200-500 mg of calcium per serving include: ? 8 oz (237 ml) of milk, fortified nondairy milk, and fortified fruit juice. ? 8 oz (237 ml) of kefir, yogurt, and soy yogurt. ? 4 oz (118 ml) of tofu. ? 1 oz of cheese. ? 1 cup (300 g) of dried figs. ? 1 cup (91 g) of cooked broccoli. ? 1-3 oz can of sardines or mackerel.  Most people need 1000 to 1500 mg of calcium each day. Talk to your dietitian about how much calcium is recommended for you. Shopping  Buy plenty of fresh fruits and vegetables. Most people do not need to avoid fruits and vegetables, even if they contain nutrients that may contribute to kidney stones.  When shopping for convenience foods, choose: ? Whole pieces of fruit. ? Premade salads with dressing on the side. ? Low-fat fruit and yogurt smoothies.  Avoid buying frozen meals or prepared deli foods.  Look for foods with live cultures, such as yogurt and kefir. Cooking  Do not add salt to food when cooking. Place a salt shaker on the table and allow each person to add his or her own salt to taste.  Use vegetable protein, such as beans, textured vegetable  protein (TVP), or tofu instead of meat in pasta, casseroles, and soups. Meal planning   Eat less salt, if told by your dietitian. To do this: ? Avoid eating processed or premade food. ? Avoid eating fast food.  Eat less animal protein, including cheese, meat, poultry, or fish, if told by your dietitian. To do this: ? Limit the number of times you have meat, poultry, fish, or cheese each week. Eat a diet free of meat at least 2 days a week. ? Eat only one serving each day of meat, poultry, fish, or seafood. ? When you prepare animal protein, cut pieces into small portion sizes. For most meat and fish, one serving is about the size of one deck of cards.  Eat at least 5 servings of fresh fruits and vegetables each day. To do this: ? Keep fruits and vegetables on hand for snacks. ? Eat 1 piece of fruit or a handful of berries with breakfast. ? Have a salad and fruit at lunch. ? Have two kinds of vegetables at dinner.  Limit foods that are high in a substance called oxalate. These include: ? Spinach. ? Rhubarb. ? Beets. ? Potato chips and french fries. ? Nuts.  If you regularly take a diuretic medicine, make sure to eat at least 1-2 fruits or vegetables high in potassium each day. These include: ? Avocado. ? Banana. ? Orange, prune, carrot, or tomato juice. ? Baked potato. ? Cabbage. ? Beans and split   If you regularly take a diuretic medicine, make sure to eat at least 1-2 fruits or vegetables high in potassium each day. These include:  ? Avocado.  ? Banana.  ? Orange, prune, carrot, or tomato juice.  ? Baked potato.  ? Cabbage.  ? Beans and split peas.  General instructions     Drink enough fluid to keep your urine clear or pale yellow. This is the most important thing you can do.   Talk to your health care provider and dietitian about taking daily supplements. Depending on your health and the cause of your kidney stones, you may be advised:  ? Not to take supplements with vitamin C.  ? To take a calcium supplement.  ? To take a daily probiotic supplement.  ? To take other supplements such as magnesium, fish oil, or vitamin B6.   Take all medicines and supplements as told by your health care provider.   Limit alcohol intake to no  more than 1 drink a day for nonpregnant women and 2 drinks a day for men. One drink equals 12 oz of beer, 5 oz of wine, or 1 oz of hard liquor.   Lose weight if told by your health care provider. Work with your dietitian to find strategies and an eating plan that works best for you.  What foods are not recommended?  Limit your intake of the following foods, or as told by your dietitian. Talk to your dietitian about specific foods you should avoid based on the type of kidney stones and your overall health.  Grains  Breads. Bagels. Rolls. Baked goods. Salted crackers. Cereal. Pasta.  Vegetables  Spinach. Rhubarb. Beets. Canned vegetables. Pickles. Olives.  Meats and other protein foods  Nuts. Nut butters. Large portions of meat, poultry, or fish. Salted or cured meats. Deli meats. Hot dogs. Sausages.  Dairy  Cheese.  Beverages  Regular soft drinks. Regular vegetable juice.  Seasonings and other foods  Seasoning blends with salt. Salad dressings. Canned soups. Soy sauce. Ketchup. Barbecue sauce. Canned pasta sauce. Casseroles. Pizza. Lasagna. Frozen meals. Potato chips. French fries.  Summary   You can reduce your risk of kidney stones by making changes to your diet.   The most important thing you can do is drink enough fluid. You should drink enough fluid to keep your urine clear or pale yellow.   Ask your health care provider or dietitian how much protein from animal sources you should eat each day, and also how much salt and calcium you should have each day.  This information is not intended to replace advice given to you by your health care provider. Make sure you discuss any questions you have with your health care provider.  Document Released: 05/29/2010 Document Revised: 01/13/2016 Document Reviewed: 01/13/2016  Elsevier Interactive Patient Education  2019 [employer]

## 2018-03-27 ENCOUNTER — Telehealth: Payer: Self-pay | Admitting: Family Medicine

## 2018-03-27 NOTE — Telephone Encounter (Signed)
Patient notified and voiced understanding.

## 2018-03-27 NOTE — Telephone Encounter (Signed)
-----   Message from Billey Co, MD sent at 03/27/2018 12:47 PM EST ----- Unable to see any stones on her x-ray, as they are so small.  Would not be able to perform shockwave lithotripsy since unable to visualize on x-ray.  Would not recommend any surgical intervention for these nonobstructing very tiny stones, as highly doubt this is the cause of her pain and they have been present and stable for over 5 years.  Nickolas Madrid, MD 03/27/2018

## 2018-03-28 ENCOUNTER — Emergency Department (HOSPITAL_COMMUNITY): Payer: Federal, State, Local not specified - PPO

## 2018-03-28 ENCOUNTER — Encounter (HOSPITAL_COMMUNITY): Payer: Self-pay

## 2018-03-28 ENCOUNTER — Emergency Department (HOSPITAL_COMMUNITY)
Admission: EM | Admit: 2018-03-28 | Discharge: 2018-03-28 | Disposition: A | Discharge status: Home / Self Care | Payer: Federal, State, Local not specified - PPO | Attending: Emergency Medicine | Admitting: Emergency Medicine

## 2018-03-28 ENCOUNTER — Other Ambulatory Visit: Payer: Self-pay

## 2018-03-28 DIAGNOSIS — R1011 Right upper quadrant pain: Secondary | ICD-10-CM

## 2018-03-28 DIAGNOSIS — R1013 Epigastric pain: Secondary | ICD-10-CM | POA: Insufficient documentation

## 2018-03-28 LAB — CBC
HCT: 41.7 % (ref 36.0–46.0)
Hemoglobin: 13.1 g/dL (ref 12.0–15.0)
MCH: 26.4 pg (ref 26.0–34.0)
MCHC: 31.4 g/dL (ref 30.0–36.0)
MCV: 83.9 fL (ref 80.0–100.0)
Platelets: 217 10*3/uL (ref 150–400)
RBC: 4.97 MIL/uL (ref 3.87–5.11)
RDW: 13.7 % (ref 11.5–15.5)
WBC: 7.6 10*3/uL (ref 4.0–10.5)
nRBC: 0 % (ref 0.0–0.2)

## 2018-03-28 LAB — COMPREHENSIVE METABOLIC PANEL
ALT: 40 U/L (ref 0–44)
AST: 40 U/L (ref 15–41)
Albumin: 3.9 g/dL (ref 3.5–5.0)
Alkaline Phosphatase: 77 U/L (ref 38–126)
Anion gap: 9 (ref 5–15)
BUN: 9 mg/dL (ref 6–20)
CO2: 26 mmol/L (ref 22–32)
Calcium: 10.3 mg/dL (ref 8.9–10.3)
Chloride: 104 mmol/L (ref 98–111)
Creatinine, Ser: 0.73 mg/dL (ref 0.44–1.00)
GFR calc Af Amer: >60 mL/min (ref >60)
GFR calc non Af Amer: >60 mL/min (ref >60)
Glucose, Bld: 181 mg/dL — ABNORMAL HIGH (ref 70–99)
Potassium: 4.2 mmol/L (ref 3.5–5.1)
Sodium: 139 mmol/L (ref 135–145)
Total Bilirubin: 0.6 mg/dL (ref 0.3–1.2)
Total Protein: 7.1 g/dL (ref 6.5–8.1)

## 2018-03-28 LAB — I-STAT BETA HCG BLOOD, ED (MC, WL, AP ONLY): I-stat hCG, quantitative: <5 m[IU]/mL (ref <5)

## 2018-03-28 LAB — URINALYSIS, ROUTINE W REFLEX MICROSCOPIC
Bilirubin Urine: NEGATIVE
Glucose, UA: NEGATIVE mg/dL
Hgb urine dipstick: NEGATIVE
Ketones, ur: NEGATIVE mg/dL
Leukocytes,Ua: NEGATIVE
Nitrite: NEGATIVE
Protein, ur: NEGATIVE mg/dL
Specific Gravity, Urine: 1.005 (ref 1.005–1.030)
pH: 6 (ref 5.0–8.0)

## 2018-03-28 LAB — LIPASE, BLOOD: Lipase: 24 U/L (ref 11–51)

## 2018-03-28 MED ORDER — SUCRALFATE 1 G PO TABS: 1.0000 g | ORAL_TABLET | Freq: Three times a day (TID) | ORAL | 0 refills | Status: DC

## 2018-03-28 MED ORDER — PANTOPRAZOLE SODIUM 20 MG PO TBEC: 20.0000 mg | DELAYED_RELEASE_TABLET | Freq: Every day | ORAL | 0 refills | Status: DC

## 2018-03-28 MED ORDER — DICYCLOMINE HCL 20 MG PO TABS: 20.0000 mg | ORAL_TABLET | Freq: Two times a day (BID) | ORAL | 0 refills | Status: DC

## 2018-03-28 MED ORDER — SODIUM CHLORIDE 0.9% FLUSH: 3.0000 mL | Freq: Once | INTRAVENOUS | Status: DC

## 2018-03-28 NOTE — ED Notes (Signed)
Patient returned from Ultrasound. 

## 2018-03-28 NOTE — ED Triage Notes (Signed)
Pt reports right sided flank pain that radiates to her abd that started 2 weeks ago. Nausea, decreased appetite. Seen at PCP with CT scan with kidney stones, pt followed up with urologist but they told her that shouldn't be causing her pain because the stones are inside her kidneys. Pt also reports abd distention. Last BM last night.

## 2018-03-28 NOTE — Telephone Encounter (Signed)
Patient called stating that she would like a nurse to call her back, she states her stomach is swollen, her back and both sides are in pain, and that there is blood in her urine. Please follow up.

## 2018-03-28 NOTE — Telephone Encounter (Signed)
She needs to go to the ED

## 2018-03-28 NOTE — Discharge Instructions (Signed)

## 2018-03-28 NOTE — Telephone Encounter (Signed)
Called patient and informed her, patient had no further questions.

## 2018-03-28 NOTE — ED Notes (Signed)
Pt alert and oriented in NAD. Pt verbalized understanding of discharge instructions. 

## 2018-03-29 NOTE — ED Provider Notes (Signed)
Emergency Department Provider Note   I have reviewed the triage vital signs and the nursing notes.   HISTORY  Chief Complaint Abdominal Pain   HPI Morgan [name] is a 53 y.o. female presents to the ED with right flank and epigastric pain. Symptoms after been ongoing for the last 1-2 weeks. Patient had an outpatient CT which showed renal stones but nothing in the ureter. No UTI symptoms. Not worse with eating. Some radiation to the epigastric region. No fever or chills. No lower abdominal pain or vaginal bleeding/discharge. Patient did have a liver lesion noted on CT and has MRI scheduled for 1 week from now through PCP.    Past Medical History:  Diagnosis Date  . Anxiety   . Bipolar disorder ([place])   . Breast mass    right  . Cervical dysplasia   . Diabetes mellitus without complication ([place])   . Endometriosis   . GERD (gastroesophageal reflux disease)   . Hypercholesteremia   . Hypertension   . Migraine   . Pelvic kidney    left  . Postpartum depression    hx of    Patient Active Problem List   Diagnosis Date Noted  . Chest pain 06/14/2017  . Hypertension complicating diabetes ([place]) 06/14/2017  . Type 2 diabetes mellitus with hyperlipidemia ([place]) 06/14/2017  . Hyperlipidemia associated with type 2 diabetes mellitus ([place]) 06/14/2017  . Cervical dysplasia   . Bipolar disorder [facility])     Past Surgical History:  Procedure Laterality Date  . BACK SURGERY  2014  . BREAST LUMPECTOMY WITH RADIOACTIVE SEED LOCALIZATION Right 10/28/2014   Procedure: RIGHT BREAST LUMPECTOMY WITH RADIOACTIVE SEED LOCALIZATION;  Surgeon: [name], MD;  Location: [facility];  Service: General;  Laterality: Right;  . CESAREAN SECTION  03  . COLPOSCOPY    . GYNECOLOGIC CRYOSURGERY    . NECK SURGERY  2012  . PELVIC LAPAROSCOPY  89   Allergies Lamotrigine  Family History  Problem Relation Age of Onset  . Diabetes Mother   . Breast cancer Mother 70  . Thyroid  disease Mother   . Depression Mother   . Hypertension Father   . Heart disease Father   . Heart attack Father 44       Two instances, first at age 73  . Stroke Father   . Alcohol abuse Father   . Throat cancer Father   . Diabetes Maternal Grandmother   . Depression Maternal Grandmother   . Uterine cancer Maternal Grandmother     Social History Social History   Tobacco Use  . Smoking status: Never Smoker  . Smokeless tobacco: Never Used  Substance Use Topics  . Alcohol use: No  . Drug use: No    Review of Systems  Constitutional: No fever/chills Eyes: No visual changes. ENT: No sore throat. Cardiovascular: Denies chest pain. Respiratory: Denies shortness of breath. Gastrointestinal: Positive right flank and epigastric abdominal pain.  No nausea, no vomiting.  No diarrhea.  No constipation. Genitourinary: Negative for dysuria. Musculoskeletal: Negative for back pain. Skin: Negative for rash. Neurological: Negative for headaches, focal weakness or numbness.  10-point ROS otherwise negative.  ____________________________________________   PHYSICAL EXAM:  VITAL SIGNS: ED Triage Vitals  Enc Vitals Group     BP 03/28/18 1650 125/79     Pulse Rate 03/28/18 1650 (!) 114     Resp 03/28/18 1650 17     Temp 03/28/18 1650 98.5 F (36.9 C)  Temp Source 03/28/18 1650 Oral     SpO2 03/28/18 1650 97 %     Weight 03/28/18 1859 185 lb 11.1 oz (84.2 kg)     Height 03/28/18 1859 5\' 3"  (1.6 m)     Pain Score 03/28/18 1655 7   Constitutional: Alert and oriented. Well appearing and in no acute distress. Eyes: Conjunctivae are normal. Head: Atraumatic. Nose: No congestion/rhinnorhea. Mouth/Throat: Mucous membranes are moist.  Neck: No stridor.   Cardiovascular: Normal rate, regular rhythm. Good peripheral circulation. Grossly normal heart sounds.   Respiratory: Normal respiratory effort.  No retractions. Lungs CTAB. Gastrointestinal: Soft with mild epigastric abdominal  discomfort to palpation. No Murphy's sign. No distention. No CVA tenderness.  Musculoskeletal: No lower extremity tenderness nor edema. No gross deformities of extremities. Neurologic:  Normal speech and language. No gross focal neurologic deficits are appreciated.  Skin:  Skin is warm, dry and intact. No rash noted.  ____________________________________________   LABS (all labs ordered are listed, but only abnormal results are displayed)  Labs Reviewed  COMPREHENSIVE METABOLIC PANEL - Abnormal; Notable for the following components:      Result Value   Glucose, Bld 181 (*)    All other components within normal limits  URINALYSIS, ROUTINE W REFLEX MICROSCOPIC - Abnormal; Notable for the following components:   Color, Urine STRAW (*)    All other components within normal limits  LIPASE, BLOOD  CBC  I-STAT BETA HCG BLOOD, ED ([facility], [facility], [facility] ONLY)   ____________________________________________  RADIOLOGY  US Abdomen Limited Ruq  Result Date: 03/28/2018 CLINICAL DATA:  Right upper quadrant abdomen pain. EXAM: ULTRASOUND ABDOMEN LIMITED RIGHT UPPER QUADRANT COMPARISON:  CT abdomen pelvis March 15, 2018 FINDINGS: Gallbladder: No gallstones or wall thickening visualized. No sonographic Murphy sign noted by sonographer. Common bile duct: Diameter: 2.5 mm. Liver: 1.7 x 1.6 cm hypoechoic lesion is identified in the right lobe liver correlating to the lesion seen on recent CT abdomen and pelvis. There is diffuse increased echotexture of the liver. Portal vein is patent on color Doppler imaging with normal direction of blood flow towards the liver. IMPRESSION: No evidence of acute cholecystitis. Fatty infiltration of liver. 1.7 x 1.6 cm hypoechoic lesion is identified in the right lobe liver correlating to the lesion seen on recent CT abdomen and pelvis. And MRI of the liver was recommended on recent CT of abdomen pelvis for further evaluation of this lesion on outpatient basis. Electronically Signed    By: Abelardo Diesel M.D.   On: 03/28/2018 20:54    ____________________________________________   PROCEDURES  Procedure(s) performed:   Procedures  None ____________________________________________   INITIAL IMPRESSION / ASSESSMENT AND PLAN / ED COURSE  Pertinent labs & imaging results that were available during my care of the patient were reviewed by me and considered in my medical decision making (see chart for details).  Patient with continued right flank pain radiating to the epigastric area. Mild tenderness on exam. Reviewed CT from two week prior. No repeat scan today but sent for RUQ [place] which was negative. Patient has MRI schedule through PCP in 1 week for liver lesion. Doubt ACS given tenderness. No radiation to the chest or other anginal equivalents. Plan for Carafate and Protonix with continued PCP follow up and possible GI referral if symptoms continue.    ____________________________________________  FINAL CLINICAL IMPRESSION(S) / ED DIAGNOSES  Final diagnoses:  RUQ abdominal pain     NEW OUTPATIENT MEDICATIONS STARTED DURING THIS VISIT:  Discharge Medication List as  of 03/28/2018  9:11 PM    START taking these medications   Details  dicyclomine (BENTYL) 20 MG tablet Take 1 tablet (20 mg total) by mouth 2 (two) times daily., Starting Tue 03/28/2018, Print    pantoprazole (PROTONIX) 20 MG tablet Take 1 tablet (20 mg total) by mouth daily for 30 days., Starting Tue 03/28/2018, Until Thu 04/27/2018, Print    sucralfate (CARAFATE) 1 g tablet Take 1 tablet (1 g total) by mouth 4 (four) times daily -  with meals and at bedtime for 7 days., Starting Tue 03/28/2018, Until Tue 04/04/2018, Print        Note:  This document was prepared using Dragon voice recognition software and may include unintentional dictation errors.  Nanda Quinton, MD Emergency Medicine    Shawntae Lowy, [facility], MD 03/29/18 1040

## 2018-04-04 ENCOUNTER — Ambulatory Visit (HOSPITAL_COMMUNITY)
Admission: RE | Admit: 2018-04-04 | Discharge: 2018-04-04 | Disposition: A | Discharge status: Home / Self Care | Payer: Federal, State, Local not specified - PPO | Source: Ambulatory Visit | Attending: Family Medicine | Admitting: Family Medicine

## 2018-04-04 DIAGNOSIS — R160 Hepatomegaly, not elsewhere classified: Secondary | ICD-10-CM | POA: Insufficient documentation

## 2018-04-04 MED ORDER — GADOXETATE DISODIUM 0.25 MMOL/ML IV SOLN
8.0000 mL | Freq: Once | INTRAVENOUS | Status: AC
  Administered 2018-04-04: 8 mL via INTRAVENOUS

## 2018-04-05 ENCOUNTER — Encounter: Payer: Self-pay | Admitting: Women's Health

## 2018-04-05 ENCOUNTER — Ambulatory Visit: Payer: Federal, State, Local not specified - PPO | Admitting: Women's Health

## 2018-04-05 VITALS — BP 120/78

## 2018-04-05 DIAGNOSIS — R1030 Lower abdominal pain, unspecified: Secondary | ICD-10-CM

## 2018-04-05 DIAGNOSIS — R319 Hematuria, unspecified: Secondary | ICD-10-CM | POA: Diagnosis not present

## 2018-04-05 DIAGNOSIS — N898 Other specified noninflammatory disorders of vagina: Secondary | ICD-10-CM | POA: Diagnosis not present

## 2018-04-05 LAB — WET PREP FOR TRICH, YEAST, CLUE

## 2018-04-05 MED ORDER — NYSTATIN-TRIAMCINOLONE 100000-0.1 UNIT/GM-% EX OINT: 1.0000 "application " | TOPICAL_OINTMENT | Freq: Two times a day (BID) | CUTANEOUS | 0 refills | Status: DC

## 2018-04-05 NOTE — Progress Notes (Signed)
53 year old MWF G1, P1 presents with complaint of generalized abdominal, low back pain for the past several months.  Reports abdominal bloating,  not feeling well,   nausea weight is down 8 pounds in the last month.  Denies constipation/diarrhea, vomiting, fever, pain or burning with urination.  Reports questionable blood in the urine.  Postmenopausal on no HRT with no bleeding in several years.  States has seen primary care and had negative x-rays of kidney, gallbladder, liver and pancreas.  Medical problems include hypertension, hypercholesteremia, diabetes, GERD, anxiety/depression.  No changes in medicine, recent addition of Bentyl.  Exam: No CVAT, discomfort sacral area.  Abdomen soft without rebound although is firm.  No radiation or tenderness with exam.  External genitalia within normal limits, speculum exam no visible blood, discharge, erythema, odor noted.  Wet prep negative.  Bimanual no CMT or adnexal tenderness no tenderness with exam.   UA: +2 protein, trace ketones, negative nitrites, negative leukocytes, 0-5 WBCs, no RBCs, 6-10 squamous epithelials, moderate bacteria  Abdominal bloating with nausea  Plan: Schedule ultrasound.  Reviewed normality of vaginal exam and wet prep, reviewed no blood noted in urine.  Urine culture pending.  Schedule annual exam overdue.

## 2018-04-06 ENCOUNTER — Encounter: Payer: Self-pay | Admitting: Family Medicine

## 2018-04-06 ENCOUNTER — Telehealth: Payer: Self-pay | Admitting: Family Medicine

## 2018-04-06 DIAGNOSIS — K760 Fatty (change of) liver, not elsewhere classified: Secondary | ICD-10-CM

## 2018-04-06 DIAGNOSIS — R1011 Right upper quadrant pain: Secondary | ICD-10-CM

## 2018-04-06 DIAGNOSIS — K769 Liver disease, unspecified: Secondary | ICD-10-CM

## 2018-04-06 DIAGNOSIS — G8929 Other chronic pain: Secondary | ICD-10-CM

## 2018-04-06 DIAGNOSIS — N289 Disorder of kidney and ureter, unspecified: Secondary | ICD-10-CM

## 2018-04-06 NOTE — Progress Notes (Unsigned)
Referral closed message: ===View-only below this line===  ----- Message ----- From: Lillia Corporal Sent: 04/06/2018   9:31 AM EST To: Scot Jun, FNP  I am going to close the referral. [name] is now an established patient and Dr. Diamantina Providence reviewed the MRI and does not see the need for an appointment at this time. Dr. Diamantina Providence states the findings are cyst. He has scheduled a yearly follow up with a KUB prior.

## 2018-04-06 NOTE — Telephone Encounter (Signed)
Contact patient to advise recent MR of the liver continues to show a slightly enlarged liver lesion . The size increased from 1.3 cm to 1.6 x 1.5. lesion appears to be a benign hemangioma and she has fatty liver disease which we discussed is most lightly related to prior dietary choices.  However given her recent weight loss and persistent abdominal pain, I am placing a referral to [facility].  This MRI of the liver also noted a lesion on the left kidney which was not previously noted on CT. The imaging indicates lesion is likely a benign finding however, I will send an updated referral to your current urologist for second opinion and second review of imaging.

## 2018-04-06 NOTE — Telephone Encounter (Signed)
Patient notified of MRI results & recommendations. Expressed understanding. Aware that she will be getting contacted by GI for further work up.

## 2018-04-07 LAB — URINALYSIS, COMPLETE W/RFL CULTURE
Bilirubin Urine: NEGATIVE
Glucose, UA: NEGATIVE
Hgb urine dipstick: NEGATIVE
Hyaline Cast: NONE SEEN /LPF
Leukocyte Esterase: NEGATIVE
Nitrites, Initial: NEGATIVE
RBC / HPF: NONE SEEN /HPF (ref 0–2)
Specific Gravity, Urine: 1.02 (ref 1.001–1.03)
pH: 5.5 (ref 5.0–8.0)

## 2018-04-07 LAB — CULTURE INDICATED

## 2018-04-07 LAB — URINE CULTURE
MICRO NUMBER:: 220634
SPECIMEN QUALITY:: ADEQUATE

## 2018-04-10 ENCOUNTER — Other Ambulatory Visit: Payer: Self-pay

## 2018-04-10 ENCOUNTER — Encounter: Payer: Self-pay | Admitting: Gastroenterology

## 2018-04-10 ENCOUNTER — Ambulatory Visit: Payer: Federal, State, Local not specified - PPO | Admitting: Gastroenterology

## 2018-04-10 VITALS — BP 138/70 | HR 68 | Ht 63.0 in | Wt 184.2 lb

## 2018-04-10 DIAGNOSIS — D1803 Hemangioma of intra-abdominal structures: Secondary | ICD-10-CM

## 2018-04-10 DIAGNOSIS — Z791 Long term (current) use of non-steroidal anti-inflammatories (NSAID): Secondary | ICD-10-CM

## 2018-04-10 DIAGNOSIS — K760 Fatty (change of) liver, not elsewhere classified: Secondary | ICD-10-CM

## 2018-04-10 DIAGNOSIS — R140 Abdominal distension (gaseous): Secondary | ICD-10-CM | POA: Diagnosis not present

## 2018-04-10 DIAGNOSIS — Z1211 Encounter for screening for malignant neoplasm of colon: Secondary | ICD-10-CM

## 2018-04-10 MED ORDER — DICYCLOMINE HCL 20 MG PO TABS: 20.0000 mg | ORAL_TABLET | Freq: Two times a day (BID) | ORAL | 0 refills | Status: DC

## 2018-04-10 NOTE — Progress Notes (Signed)
[name] MD, MRCP(U.K) [address]  [place]  [place], [place] [address]  Main: [phone]  Fax: [phone]   Gastroenterology Consultation  Referring Provider:     Scot Jun, FNP Primary Care Physician:  Scot Jun, FNP Primary Gastroenterologist:  Dr. [name]  Reason for Consultation:     Fatty liver, liver lesion and abdominal pain.        HPI:   Morgan [name] is a 53 y.o. y/o female referred for consultation & management  by Dr. Kenton Kingfisher, Carroll Sage, FNP.    She presented to the emergency room on 03/28/2018 with right flank and epigastric pain going on for about 2 weeks.  She underwent a CT scan of the abdomen on 03/15/2018 which demonstrated hepatic steatosis and a 20 mm hyperattenuating focus in the right hepatic lobe.  Differentials were flash filling hemangioma versus focal nodular hyperplasia or hepatic adenoma a follow-up MRI was recommended.  Bilateral nonobstructive nephrolithiasis were noted in the pelvic left kidney.  Right upper quadrant ultrasound on 03/28/2018 demonstrated a hypoechoic lesion in the right lobe of liver correlating to the lesion seen on the recent CT scan of the pelvis.  Subsequent MRI with contrast on 04/05/2018 suggested that it may be a hemangioma and less likely to be focal nodular hyperplasia and the lesion has only minimally increased in size over the last 7 years.  Diffuse hepatic steatosis noted  She says she has had abdominal pain for month, it is continuous, all day and night , lower abdomen , intensity varies, also has some rt flank Sometimes the backpain radiates anteriorly , pain is not relived by anything , worse eating. A bowel movement does not change it. She says that she has a bowel movement every 2-3 days but not hard. Lots of bloating. She says no worse with the pain when bloating is worse. Lost 6 lbs unintentionally. She takes ibuprofen 2-4 tablets a day for a month. She denies any chest pain. She denies any  family history of colon cancer. No prior colonoscopy or EGD. She takes Prilosec at night   She has had endometriosis , menopause 8 years back . Pain was lower back then . She consumes regular Dr Malachi Bonds. No artificial sugars. No chewing gum, no crystalite.   Past Medical History:  Diagnosis Date  . Anxiety   . Bipolar disorder ([place])   . Breast mass    right  . Cervical dysplasia   . Diabetes mellitus without complication ([place])   . Endometriosis   . GERD (gastroesophageal reflux disease)   . Hypercholesteremia   . Hypertension   . Migraine   . Pelvic kidney    left  . Postpartum depression    hx of    Past Surgical History:  Procedure Laterality Date  . BACK SURGERY  2014  . BREAST LUMPECTOMY WITH RADIOACTIVE SEED LOCALIZATION Right 10/28/2014   Procedure: RIGHT BREAST LUMPECTOMY WITH RADIOACTIVE SEED LOCALIZATION;  Surgeon: [name], MD;  Location: [facility];  Service: General;  Laterality: Right;  . CESAREAN SECTION  03  . COLPOSCOPY    . GYNECOLOGIC CRYOSURGERY    . NECK SURGERY  2012  . PELVIC LAPAROSCOPY  91    Prior to Admission medications   Medication Sig Start Date End Date Taking? Authorizing Provider  ARIPiprazole (ABILIFY) 15 MG tablet Take 1 tablet by mouth at bedtime. 12/21/17   Noemi Chapel, NP  atorvastatin (LIPITOR) 10 MG tablet Take 10  mg by mouth at bedtime. 11/04/16   [provider]  cyclobenzaprine (FLEXERIL) 5 MG tablet Take 1 tablet (5 mg total) by mouth 3 (three) times daily as needed for muscle spasms. 03/21/18   Scot Jun, FNP  diazepam (VALIUM) 5 MG tablet Take 1 tablet (5 mg total) by mouth every 8 (eight) hours as needed for muscle spasms. 03/12/18   Earleen Newport, MD  dicyclomine (BENTYL) 20 MG tablet Take 1 tablet (20 mg total) by mouth 2 (two) times daily. 03/28/18   Long, Wonda Olds, MD  DULoxetine (CYMBALTA) 60 MG capsule Take 60 mg by mouth at bedtime. 11/04/16   [provider]  losartan  (COZAAR) 100 MG tablet Take 1 tablet daily by mouth. 03/21/18   Scot Jun, FNP  metFORMIN (GLUCOPHAGE) 1000 MG tablet Take 1 tablet (1,000 mg total) by mouth 2 (two) times daily. 06/19/17   Mariel Aloe, MD  nystatin-triamcinolone ointment [facility]) Apply 1 application topically 2 (two) times daily. 04/05/18   Huel Cote, NP  omeprazole (PRILOSEC) 20 MG capsule Take 20 mg by mouth at bedtime.     [provider]  pantoprazole (PROTONIX) 20 MG tablet Take 1 tablet (20 mg total) by mouth daily for 30 days. 03/28/18 04/27/18  Long, Wonda Olds, MD  sucralfate (CARAFATE) 1 g tablet Take 1 tablet (1 g total) by mouth 4 (four) times daily -  with meals and at bedtime for 7 days. 03/28/18 04/04/18  Long, Wonda Olds, MD    Family History  Problem Relation Age of Onset  . Diabetes Mother   . Breast cancer Mother 6  . Thyroid disease Mother   . Depression Mother   . Hypertension Father   . Heart disease Father   . Heart attack Father 67       Two instances, first at age 45  . Stroke Father   . Alcohol abuse Father   . Throat cancer Father   . Diabetes Maternal Grandmother   . Depression Maternal Grandmother   . Uterine cancer Maternal Grandmother      Social History   Tobacco Use  . Smoking status: Never Smoker  . Smokeless tobacco: Never Used  Substance Use Topics  . Alcohol use: No  . Drug use: No    Allergies as of 04/10/2018 - Review Complete 03/28/2018  Allergen Reaction Noted  . Lamotrigine Rash 08/29/2014    Review of Systems:    All systems reviewed and negative except where noted in HPI.   Physical Exam:  LMP 05/12/2011  Patient's last menstrual period was 05/12/2011. Psych:  Alert and cooperative. Normal mood and affect. General:   Alert,  Well-developed, well-nourished, pleasant and cooperative in NAD Head:  Normocephalic and atraumatic. Eyes:  Sclera clear, no icterus.   Conjunctiva pink. Ears:  Normal auditory acuity. Nose:  No deformity, discharge,  or lesions. Mouth:  No deformity or lesions,oropharynx pink & moist. Neck:  Supple; no masses or thyromegaly. Lungs:  Respirations even and unlabored.  Clear throughout to auscultation.   No wheezes, crackles, or rhonchi. No acute distress. Heart:  Regular rate and rhythm; no murmurs, clicks, rubs, or gallops. Abdomen:  Normal bowel sounds.  No bruits.  Soft, non-tender and non-distended without masses, hepatosplenomegaly or hernias noted.  No guarding or rebound tenderness.    Msk:  Symmetrical without gross deformities. Good, equal movement & strength bilaterally. Pulses:  Normal pulses noted. Extremities:  No clubbing or edema.  No cyanosis. Neurologic:  Alert and oriented x3;  grossly normal neurologically. Skin:  Intact without significant lesions or rashes. No jaundice. Lymph Nodes:  No significant cervical adenopathy. Psych:  Alert and cooperative. Normal mood and affect.  Imaging Studies: Ct Abdomen Pelvis Wo Contrast  Result Date: 03/15/2018 CLINICAL DATA:  Right flank pain EXAM: CT ABDOMEN AND PELVIS WITHOUT CONTRAST TECHNIQUE: Multidetector CT imaging of the abdomen and pelvis was performed following the standard protocol without IV contrast. COMPARISON:  09/13/2017 FINDINGS: LOWER CHEST: There is no basilar pleural or apical pericardial effusion. HEPATOBILIARY: Diffuse hypoattenuation of the liver relative to the spleen suggests hepatic steatosis. There is a hyperattenuating focus in the right hepatic lobe measuring 20 mm (axial image 26). The gallbladder is normal. PANCREAS: The pancreatic parenchymal contours are normal and there is no ductal dilatation. There is no peripancreatic fluid collection. SPLEEN: Normal. ADRENALS/URINARY TRACT: --Adrenal glands: Normal. --Right kidney/ureter: Single nonobstructing renal calculus measuring 3 mm. No hydronephrosis, perinephric stranding or solid renal mass. --Left kidney/ureter: Pelvic left kidney with nonobstructing stone measuring 3 mm.  --Urinary bladder: Normal for degree of distention STOMACH/BOWEL: --Stomach/Duodenum: There is no hiatal hernia or other gastric abnormality. The duodenal course and caliber are normal. --Small bowel: No dilatation or inflammation. --Colon: No focal abnormality. --Appendix: Normal. VASCULAR/LYMPHATIC: Normal course and caliber of the major abdominal vessels. No abdominal or pelvic lymphadenopathy. REPRODUCTIVE: Normal uterus and ovaries. MUSCULOSKELETAL. L4-5 PLIF. OTHER: None. IMPRESSION: 1. No acute abdominal or pelvic findings. 2. Hepatic steatosis. 3. 20 mm hyperattenuating focus in the right hepatic lobe, unchanged. This may the a flash filling hemangioma, focal nodular hyperplasia, or hepatic adenoma. Nonemergent MRI of the liver with and without contrast could provide further characterization. 4. Bilateral nonobstructive nephrolithiasis.  Pelvic left kidney. Electronically Signed   By: Ulyses Jarred M.D.   On: 03/15/2018 14:02   Abdomen 1 View (kub)  Result Date: 03/24/2018 CLINICAL DATA:  Hx of previous kidney stone(s), now with pain both sides. Per patient, 2 stones on right, 1 on left. Pain greater on right side. EXAM: ABDOMEN - 1 VIEW COMPARISON:  CT, 03/15/2018 FINDINGS: Normal bowel gas pattern, with no evidence of obstruction. Mild generalized increased stool burden throughout the colon. No evidence of renal or ureteral stones tiny stones noted in the right flank kidney and left pelvic kidney on the prior CT are not visualized radiographically. No evidence of a ureteral stone. Status post L4-L5 posterior lumbar spine fusion, stable. Skeletal structures are otherwise unremarkable. IMPRESSION: 1. No acute findings.  No evidence of bowel obstruction. 2. Mild generalized increase colonic stool burden. 3. Small stones noted on the prior CT are not visualized radiographically. No evidence of a ureteral stone. Electronically Signed   By: Lajean Manes M.D.   On: 03/24/2018 17:06   Dg Abdomen 1  View  Result Date: 03/12/2018 CLINICAL DATA:  Right flank and back pain EXAM: ABDOMEN - 1 VIEW COMPARISON:  CT scan 09/13/2017 FINDINGS: Unremarkable bowel gas pattern. Prior PLIF at L4-5. The previously shown tiny bilateral renal calculi are not well seen on today's conventional radiograph. The patient has a known left pelvic kidney. IMPRESSION: 1. No significant abnormality is radiographically apparent. Electronically Signed   By: Van Clines M.D.   On: 03/12/2018 18:07   Mr Liver W Wo Contrast  Result Date: 04/05/2018 CLINICAL DATA:  Hyperattenuating focus posteriorly in the right hepatic lobe for further workup. EXAM: MRI ABDOMEN WITHOUT AND WITH CONTRAST TECHNIQUE: Multiplanar multisequence MR imaging of the abdomen was performed both before and  after the administration of intravenous contrast. CONTRAST:  47mL EOVIST GADOXETATE DISODIUM 0.25 MOL/L IV SOLN COMPARISON:  Multiple exams, including 03/15/2018 CT scan FINDINGS: Lower chest: Numerous scattered small cystic lesions in both breasts. Hepatobiliary: Diffuse hepatic steatosis. The lesion of concern posteriorly in the right hepatic lobe measures 1.6 by 1.5 cm in diameter and has high T2 and intermediate precontrast T1 signal characteristics with arterial phase enhancement spurring a small portion of the center, with subsequent delayed diffuse enhancement. On the 20 minutes images the lesion is isointense to the hepatic parenchyma but with a faint hyperintense margin along the periphery. This lesion previously measured 1.3 cm in diameter on 12/02/2011. Gallbladder unremarkable. No significant biliary dilatation. Single tiny cysts in right hepatic lobe (image 16/22) and in segment 3 (image 23/22). Pancreas:  Unremarkable Spleen:  Unremarkable Adrenals/Urinary Tract: Left pelvic kidney noted with a small mildly complex upper pole lesion appears to have some faint internal septation without appreciable nodularity. The patient has known  nephrolithiasis. Adrenal glands unremarkable. Stomach/Bowel: Unremarkable Vascular/Lymphatic:  Unremarkable Other:  No supplemental non-categorized findings. Musculoskeletal: Postoperative findings at L4-5. IMPRESSION: 1. The lesion of concern posteriorly in the right hepatic lobe has early and delayed enhancement favoring flash filling hemangioma, less likely to be focal nodular hyperplasia. This lesion is only very minimally increased in size over the last 7 years. 2. Diffuse hepatic steatosis. 3. We partially characterize a minimally complex left kidney upper pole lesion, probably benign. Electronically Signed   By: Van Clines M.D.   On: 04/05/2018 09:22   US Abdomen Limited Ruq  Result Date: 03/28/2018 CLINICAL DATA:  Right upper quadrant abdomen pain. EXAM: ULTRASOUND ABDOMEN LIMITED RIGHT UPPER QUADRANT COMPARISON:  CT abdomen pelvis March 15, 2018 FINDINGS: Gallbladder: No gallstones or wall thickening visualized. No sonographic Murphy sign noted by sonographer. Common bile duct: Diameter: 2.5 mm. Liver: 1.7 x 1.6 cm hypoechoic lesion is identified in the right lobe liver correlating to the lesion seen on recent CT abdomen and pelvis. There is diffuse increased echotexture of the liver. Portal vein is patent on color Doppler imaging with normal direction of blood flow towards the liver. IMPRESSION: No evidence of acute cholecystitis. Fatty infiltration of liver. 1.7 x 1.6 cm hypoechoic lesion is identified in the right lobe liver correlating to the lesion seen on recent CT abdomen and pelvis. And MRI of the liver was recommended on recent CT of abdomen pelvis for further evaluation of this lesion on outpatient basis. Electronically Signed   By: Abelardo Diesel M.D.   On: 03/28/2018 20:54    Assessment and Plan:   Morgan [name] is a 53 y.o. y/o female has been referred for fatty liver, liver lesion and abdominal discomfort.   Plan 1.  The MRI demonstrates that the liver lesion is a  hemangioma which is less than 5 cm in size.  As per recent recommendations no further evaluation is needed but I would probably say that we repeat the MRI in about a year's time to make sure that there is no change in the size of the lesion as there has been a slight increase in size compared to prior evaluation.  2. Fatty liver disease: patient information provided, limit consumption of alcohol, check Hep A/ B serology   3. Daily miralax, stop all NSAID's, stop sodas. Low fod map diet   4. EGD+ colonoscopy   5. H pylori breath test   6. Take Prilosec first thing in the morning on and empty stomach and  eat 30 mins after.   7. Abdominal pain likely from one or combination of NSAID related mucosal injury vs endometriosis and scar tissue +/- constipation and effects of bloating .   I have discussed alternative options, risks & benefits,  which include, but are not limited to, bleeding, infection, perforation,respiratory complication & drug reaction.  The patient agrees with this plan & written consent will be obtained.     Follow up in 8 weeks  Dr [name] MD,MRCP(U.K)

## 2018-04-10 NOTE — Patient Instructions (Signed)
Fatty Liver Disease  Fatty liver disease occurs when too much fat has built up in your liver cells. Fatty liver disease is also called hepatic steatosis or steatohepatitis. The liver removes harmful substances from your bloodstream and produces fluids that your body needs. It also helps your body use and store energy from the food you eat. In many cases, fatty liver disease does not cause symptoms or problems. It is often diagnosed when tests are being done for other reasons. However, over time, fatty liver can cause inflammation that may lead to more serious liver problems, such as scarring of the liver (cirrhosis) and liver failure. Fatty liver is associated with insulin resistance, increased body fat, high blood pressure (hypertension), and high cholesterol. These are features of metabolic syndrome and increase your risk for stroke, diabetes, and heart disease. What are the causes? This condition may be caused by:  Drinking too much alcohol.  Poor nutrition.  Obesity.  Cushing's syndrome.  Diabetes.  High cholesterol.  Certain drugs.  Poisons.  Some viral infections.  Pregnancy. What increases the risk? You are more likely to develop this condition if you:  Abuse alcohol.  Are overweight.  Have diabetes.  Have hepatitis.  Have a high triglyceride level.  Are pregnant. What are the signs or symptoms? Fatty liver disease often does not cause symptoms. If symptoms do develop, they can include:  Fatigue.  Weakness.  Weight loss.  Confusion.  Abdominal pain.  Nausea and vomiting.  Yellowing of your skin and the white parts of your eyes (jaundice).  Itchy skin. How is this diagnosed? This condition may be diagnosed by:  A physical exam and medical history.  Blood tests.  Imaging tests, such as an ultrasound, CT scan, or MRI.  A liver biopsy. A small sample of liver tissue is removed using a needle. The sample is then looked at under a microscope. How  is this treated? Fatty liver disease is often caused by other health conditions. Treatment for fatty liver may involve medicines and lifestyle changes to manage conditions such as:  Alcoholism.  High cholesterol.  Diabetes.  Being overweight or obese. Follow these instructions at home:   Do not drink alcohol. If you have trouble quitting, ask your health care provider how to safely quit with the help of medicine or a supervised program. This is important to keep your condition from getting worse.  Eat a healthy diet as told by your health care provider. Ask your health care provider about working with a diet and nutrition specialist (dietitian) to develop an eating plan.  Exercise regularly. This can help you lose weight and control your cholesterol and diabetes. Talk to your health care provider about an exercise plan and which activities are best for you.  Take over-the-counter and prescription medicines only as told by your health care provider.  Keep all follow-up visits as told by your health care provider. This is important. Contact a health care provider if: You have trouble controlling your:  Blood sugar. This is especially important if you have diabetes.  Cholesterol.  Drinking of alcohol. Get help right away if:  You have abdominal pain.  You have jaundice.  You have nausea and vomiting.  You vomit blood or material that looks like coffee grounds.  You have stools that are black, tar-like, or bloody. Summary  Fatty liver disease develops when too much fat builds up in the cells of your liver.  Fatty liver disease often causes no symptoms or problems. However, over   time, fatty liver can cause inflammation that may lead to more serious liver problems, such as scarring of the liver (cirrhosis).  You are more likely to develop this condition if you abuse alcohol, are pregnant, are overweight, have diabetes, have hepatitis, or have high triglyceride  levels.  Contact your health care provider if you have trouble controlling your weight, blood sugar, cholesterol, or drinking of alcohol. This information is not intended to replace advice given to you by your health care provider. Make sure you discuss any questions you have with your health care provider. Document Released: 03/19/2005 Document Revised: 11/10/2016 Document Reviewed: 11/10/2016 Elsevier Interactive Patient Education  2019 [employer]vier Inc.  

## 2018-04-10 NOTE — Addendum Note (Signed)
Addended by: Dorethea Clan on: 04/10/2018 02:36 PM   Modules accepted: Orders

## 2018-04-11 ENCOUNTER — Ambulatory Visit
Admission: RE | Admit: 2018-04-11 | Discharge: 2018-04-11 | Disposition: A | Discharge status: Home / Self Care | Payer: Federal, State, Local not specified - PPO | Source: Ambulatory Visit | Attending: Women's Health | Admitting: Women's Health

## 2018-04-11 DIAGNOSIS — Z1231 Encounter for screening mammogram for malignant neoplasm of breast: Secondary | ICD-10-CM

## 2018-04-12 ENCOUNTER — Other Ambulatory Visit: Payer: Self-pay | Admitting: Women's Health

## 2018-04-12 ENCOUNTER — Encounter: Payer: Self-pay | Admitting: Women's Health

## 2018-04-12 ENCOUNTER — Ambulatory Visit (INDEPENDENT_AMBULATORY_CARE_PROVIDER_SITE_OTHER): Payer: Federal, State, Local not specified - PPO

## 2018-04-12 ENCOUNTER — Ambulatory Visit: Payer: Federal, State, Local not specified - PPO | Admitting: Women's Health

## 2018-04-12 VITALS — BP 110/78

## 2018-04-12 DIAGNOSIS — R9389 Abnormal findings on diagnostic imaging of other specified body structures: Secondary | ICD-10-CM | POA: Diagnosis not present

## 2018-04-12 DIAGNOSIS — D251 Intramural leiomyoma of uterus: Secondary | ICD-10-CM

## 2018-04-12 DIAGNOSIS — R1030 Lower abdominal pain, unspecified: Secondary | ICD-10-CM

## 2018-04-12 DIAGNOSIS — R928 Other abnormal and inconclusive findings on diagnostic imaging of breast: Secondary | ICD-10-CM

## 2018-04-12 LAB — HEPATITIS A ANTIBODY, TOTAL: Hep A Total Ab: NEGATIVE

## 2018-04-12 LAB — HEPATITIS B SURFACE ANTIBODY,QUALITATIVE: Hep B Surface Ab, Qual: NONREACTIVE

## 2018-04-12 LAB — CELIAC DISEASE AB SCREEN W/RFX
Antigliadin Abs, IgA: 8 units (ref 0–19)
IgA/Immunoglobulin A, Serum: 213 mg/dL (ref 87–352)
Transglutaminase IgA: <2 U/mL (ref 0–3)

## 2018-04-12 LAB — H. PYLORI BREATH TEST: H pylori Breath Test: NEGATIVE

## 2018-04-12 NOTE — Patient Instructions (Signed)
Uterine Fibroids  Uterine fibroids (leiomyomas) are noncancerous (benign) tumors that can develop in the uterus. Fibroids may also develop in the fallopian tubes, cervix, or tissues (ligaments) near the uterus. You may have one or many fibroids. Fibroids vary in size, weight, and where they grow in the uterus. Some can become quite large. Most fibroids do not require medical treatment. What are the causes? The cause of this condition is not known. What increases the risk? You are more likely to develop this condition if you:  Are in your 30s or 40s and have not gone through menopause.  Have a family history of this condition.  Are of African-American descent.  Had your first period at an early age (early menarche).  Have not had any children (nulliparity).  Are overweight or obese. What are the signs or symptoms? Many women do not have any symptoms. Symptoms of this condition may include:  Heavy menstrual bleeding.  Bleeding or spotting between periods.  Pain and pressure in the pelvic area, between the hips.  Bladder problems, such as needing to urinate urgently or more often than usual.  Inability to have children (infertility).  Failure to carry pregnancy to term (miscarriage). How is this diagnosed? This condition may be diagnosed based on:  Your symptoms and medical history.  A physical exam.  A pelvic exam that includes feeling for any tumors.  Imaging tests, such as ultrasound or MRI. How is this treated? Treatment for this condition may include:  Seeing your health care provider for follow-up visits to monitor your fibroids for any changes.  Taking NSAIDs such as ibuprofen, naproxen, or aspirin to reduce pain.  Hormone medicines. These may be taken as a pill, given in an injection, or delivered by a T-shaped device that is inserted into the uterus (intrauterine device, IUD).  Surgery to remove one of the following: ? The fibroids (myomectomy). Your health  care provider may recommend this if fibroids affect your fertility and you want to become pregnant. ? The uterus (hysterectomy). ? Blood supply to the fibroids (uterine artery embolization). Follow these instructions at home:  Take over-the-counter and prescription medicines only as told by your health care provider.  Ask your health care provider if you should take iron pills or eat more iron-rich foods, such as dark green, leafy vegetables. Heavy menstrual bleeding can cause low iron levels.  If directed, apply heat to your back or abdomen to reduce pain. Use the heat source that your health care provider recommends, such as a moist heat pack or a heating pad. ? Place a towel between your skin and the heat source. ? Leave the heat on for 20-30 minutes. ? Remove the heat if your skin turns bright red. This is especially important if you are unable to feel pain, heat, or cold. You may have a greater risk of getting burned.  Pay close attention to your menstrual cycle. Tell your health care provider about any changes, such as: ? Increased blood flow that requires you to use more pads or tampons than usual. ? A change in the number of days that your period lasts. ? A change in symptoms that are associated with your period, such as back pain or cramps in your abdomen.  Keep all follow-up visits as told by your health care provider. This is important, especially if your fibroids need to be monitored for any changes. Contact a health care provider if you:  Have pelvic pain, back pain, or cramps in your abdomen that   do not get better with medicine or heat.  Develop new bleeding between periods.  Have increased bleeding during or between periods.  Feel unusually tired or weak.  Feel light-headed. Get help right away if you:  Faint.  Have pelvic pain that suddenly gets worse.  Have severe vaginal bleeding that soaks a tampon or pad in 30 minutes or less. Summary  Uterine fibroids are  noncancerous (benign) tumors that can develop in the uterus.  The exact cause of this condition is not known.  Most fibroids do not require medical treatment unless they affect your ability to have children (fertility).  Contact a health care provider if you have pelvic pain, back pain, or cramps in your abdomen that do not get better with medicines.  Make sure you know what symptoms should cause you to get help right away. This information is not intended to replace advice given to you by your health care provider. Make sure you discuss any questions you have with your health care provider. Document Released: 01/30/2000 Document Revised: 12/28/2016 Document Reviewed: 12/28/2016 Elsevier Interactive Patient Education  2019 [employer]vier Inc.  

## 2018-04-12 NOTE — Progress Notes (Signed)
53 year old MWF G1, P1 presents for ultrasound.  Has had generalized abdominal and low back pain with increased abdominal bloating and just not feeling well for the past 2 months.  Has had nausea weight is down 8 pounds in the last month.  Denies constipation, diarrhea, vomiting, fever, urinary symptoms or vaginal discharge.  Reported question of blood in urine with negative blood in UA and negative culture.  Postmenopausal age 37 on no HRT with no bleeding.  Negative x-ray of kidneys, gallbladder, liver and pancreas and negative pelvic CT 02/2018 .  Scheduled for colonoscopy with endoscopy 05/01/2018.  Medical problems include hypertension, hypercholesteremia, diabetes, GERD, anxiety/depression.  Started on Bentyl in the past month only med change.  Exam: [place]: T/V and T/A, anteverted uterus with thick endometrium (9.67mm) with intramural fibroids versus partial submucous anterior wall fibroid 13 x 14 mm.  Right ovary thin-walled echo-free cyst 19 x 14 mm, left ovary seen transabdominal normal.  Negative cul-de-sac.  History of left pelvic kidney seen on trans-vaginal images.  Endometrial polyp versus submucous fibroid Persistent abdominal bloating with nausea  Plan: Schedule sonohysterogram with Dr. Phineas Real.  Reviewed it may ultrasound reviewed, questionable fibroid versus endometrial polyp and thickened endometrium.  Keep scheduled colonoscopy/endoscopy appointment with GI in March.

## 2018-04-17 ENCOUNTER — Ambulatory Visit: Payer: Federal, State, Local not specified - PPO

## 2018-04-17 ENCOUNTER — Ambulatory Visit
Admission: RE | Admit: 2018-04-17 | Discharge: 2018-04-17 | Disposition: A | Discharge status: Home / Self Care | Payer: Federal, State, Local not specified - PPO | Source: Ambulatory Visit | Attending: Women's Health | Admitting: Women's Health

## 2018-04-17 ENCOUNTER — Other Ambulatory Visit: Payer: Self-pay | Admitting: Women's Health

## 2018-04-17 DIAGNOSIS — N6310 Unspecified lump in the right breast, unspecified quadrant: Secondary | ICD-10-CM

## 2018-04-17 DIAGNOSIS — R928 Other abnormal and inconclusive findings on diagnostic imaging of breast: Secondary | ICD-10-CM

## 2018-04-18 ENCOUNTER — Ambulatory Visit: Payer: Federal, State, Local not specified - PPO | Admitting: Family Medicine

## 2018-04-19 ENCOUNTER — Other Ambulatory Visit: Payer: Self-pay | Admitting: Women's Health

## 2018-04-19 ENCOUNTER — Ambulatory Visit
Admission: RE | Admit: 2018-04-19 | Discharge: 2018-04-19 | Disposition: A | Discharge status: Home / Self Care | Payer: Federal, State, Local not specified - PPO | Source: Ambulatory Visit | Attending: Women's Health | Admitting: Women's Health

## 2018-04-19 DIAGNOSIS — N6310 Unspecified lump in the right breast, unspecified quadrant: Secondary | ICD-10-CM

## 2018-05-01 ENCOUNTER — Ambulatory Visit: Payer: Federal, State, Local not specified - PPO | Admitting: Certified Registered"

## 2018-05-01 ENCOUNTER — Encounter
Admission: RE | Disposition: A | Discharge status: Home / Self Care | Payer: Self-pay | Source: Home / Self Care | Attending: Gastroenterology

## 2018-05-01 ENCOUNTER — Ambulatory Visit
Admission: RE | Admit: 2018-05-01 | Discharge: 2018-05-01 | Disposition: A | Discharge status: Home / Self Care | Payer: Federal, State, Local not specified - PPO | Attending: Gastroenterology | Admitting: Gastroenterology

## 2018-05-01 ENCOUNTER — Encounter: Payer: Self-pay | Admitting: *Deleted

## 2018-05-01 DIAGNOSIS — K2970 Gastritis, unspecified, without bleeding: Secondary | ICD-10-CM | POA: Diagnosis not present

## 2018-05-01 DIAGNOSIS — E7800 Pure hypercholesterolemia, unspecified: Secondary | ICD-10-CM | POA: Diagnosis not present

## 2018-05-01 DIAGNOSIS — D120 Benign neoplasm of cecum: Secondary | ICD-10-CM | POA: Diagnosis not present

## 2018-05-01 DIAGNOSIS — F419 Anxiety disorder, unspecified: Secondary | ICD-10-CM | POA: Diagnosis not present

## 2018-05-01 DIAGNOSIS — E669 Obesity, unspecified: Secondary | ICD-10-CM | POA: Diagnosis not present

## 2018-05-01 DIAGNOSIS — Z1211 Encounter for screening for malignant neoplasm of colon: Secondary | ICD-10-CM | POA: Diagnosis present

## 2018-05-01 DIAGNOSIS — D1803 Hemangioma of intra-abdominal structures: Secondary | ICD-10-CM

## 2018-05-01 DIAGNOSIS — Z79899 Other long term (current) drug therapy: Secondary | ICD-10-CM | POA: Diagnosis not present

## 2018-05-01 DIAGNOSIS — K2950 Unspecified chronic gastritis without bleeding: Secondary | ICD-10-CM | POA: Insufficient documentation

## 2018-05-01 DIAGNOSIS — Z6834 Body mass index (BMI) 34.0-34.9, adult: Secondary | ICD-10-CM | POA: Insufficient documentation

## 2018-05-01 DIAGNOSIS — K219 Gastro-esophageal reflux disease without esophagitis: Secondary | ICD-10-CM | POA: Diagnosis not present

## 2018-05-01 DIAGNOSIS — R109 Unspecified abdominal pain: Secondary | ICD-10-CM | POA: Diagnosis not present

## 2018-05-01 DIAGNOSIS — Z7984 Long term (current) use of oral hypoglycemic drugs: Secondary | ICD-10-CM | POA: Insufficient documentation

## 2018-05-01 DIAGNOSIS — D124 Benign neoplasm of descending colon: Secondary | ICD-10-CM | POA: Diagnosis not present

## 2018-05-01 DIAGNOSIS — K760 Fatty (change of) liver, not elsewhere classified: Secondary | ICD-10-CM

## 2018-05-01 DIAGNOSIS — F319 Bipolar disorder, unspecified: Secondary | ICD-10-CM | POA: Diagnosis not present

## 2018-05-01 DIAGNOSIS — R140 Abdominal distension (gaseous): Secondary | ICD-10-CM

## 2018-05-01 DIAGNOSIS — Z791 Long term (current) use of non-steroidal anti-inflammatories (NSAID): Secondary | ICD-10-CM

## 2018-05-01 DIAGNOSIS — I10 Essential (primary) hypertension: Secondary | ICD-10-CM | POA: Insufficient documentation

## 2018-05-01 DIAGNOSIS — E119 Type 2 diabetes mellitus without complications: Secondary | ICD-10-CM | POA: Insufficient documentation

## 2018-05-01 HISTORY — PX: COLONOSCOPY WITH PROPOFOL: SHX5780

## 2018-05-01 HISTORY — PX: ESOPHAGOGASTRODUODENOSCOPY (EGD) WITH PROPOFOL: SHX5813

## 2018-05-01 LAB — GLUCOSE, CAPILLARY: Glucose-Capillary: 93 mg/dL (ref 70–99)

## 2018-05-01 SURGERY — COLONOSCOPY WITH PROPOFOL
Anesthesia: General

## 2018-05-01 MED ORDER — LIDOCAINE HCL (CARDIAC) PF 100 MG/5ML IV SOSY
PREFILLED_SYRINGE | INTRAVENOUS | Status: DC | PRN
  Administered 2018-05-01: 100 mg via INTRATRACHEAL

## 2018-05-01 MED ORDER — SODIUM CHLORIDE 0.9 % IV SOLN
INTRAVENOUS | Status: DC
  Administered 2018-05-01: 09:00:00 via INTRAVENOUS

## 2018-05-01 MED ORDER — PROPOFOL 10 MG/ML IV BOLUS
INTRAVENOUS | Status: DC | PRN
  Administered 2018-05-01 (×7): 50 mg via INTRAVENOUS
  Administered 2018-05-01: 10 mg via INTRAVENOUS
  Administered 2018-05-01 (×2): 50 mg via INTRAVENOUS
  Administered 2018-05-01: 10 mg via INTRAVENOUS
  Administered 2018-05-01: 50 mg via INTRAVENOUS

## 2018-05-01 NOTE — Op Note (Signed)
[facility] Gastroenterology Patient Name: Morgan [name] Procedure Date: 05/01/2018 8:43 AM MRN: [id number] Account #: [id number] Date of Birth: [birth year] Admit Type: Outpatient Age: 53 Room: [facility] ENDO ROOM 4 Gender: Female Note Status: Finalized Procedure:            Colonoscopy Indications:          Screening for colorectal malignant neoplasm Providers:            [name] MD, MD Medicines:            Monitored Anesthesia Care Complications:        No immediate complications. Procedure:            Pre-Anesthesia Assessment:                       - Prior to the procedure, a History and Physical was                        performed, and patient medications, allergies and                        sensitivities were reviewed. The patient's tolerance of                        previous anesthesia was reviewed.                       - The risks and benefits of the procedure and the                        sedation options and risks were discussed with the                        patient. All questions were answered and informed                        consent was obtained.                       - ASA Grade Assessment: II - A patient with mild                        systemic disease.                       After obtaining informed consent, the colonoscope was                        passed under direct vision. Throughout the procedure,                        the patient's blood pressure, pulse, and oxygen                        saturations were monitored continuously. The                        Colonoscope was introduced through the anus and                        advanced to the the cecum, identified by the  appendiceal orifice, IC valve and transillumination.                        The colonoscopy was performed with ease. The patient                        tolerated the procedure well. The quality of the bowel                        preparation was  good. Findings:      The perianal and digital rectal examinations were normal.      Two sessile polyps were found in the descending colon and cecum. The       polyps were 3 to 7 mm in size. These polyps were removed with a cold       snare. Resection and retrieval were complete. cecal polyp not retrieved      The exam was otherwise without abnormality on direct and retroflexion       views. Impression:           - Two 3 to 7 mm polyps in the descending colon and in                        the cecum, removed with a cold snare. Resected and                        retrieved.                       - The examination was otherwise normal on direct and                        retroflexion views. Recommendation:       - Discharge patient to home (with escort).                       - Resume previous diet.                       - Continue present medications.                       - Await pathology results.                       - Repeat colonoscopy for surveillance based on                        pathology results. Procedure Code(s):    --- Professional ---                       [id number], Colonoscopy, flexible; with removal of tumor(s),                        polyp(s), or other lesion(s) by snare technique Diagnosis Code(s):    --- Professional ---                       Z12.11, Encounter for screening for malignant neoplasm                        of colon  D12.4, Benign neoplasm of descending colon                       D12.0, Benign neoplasm of cecum CPT copyright 2018 American Medical Association. All rights reserved. The codes documented in this report are preliminary and upon coder review may  be revised to meet current compliance requirements. [name], MD [name] MD, MD 05/01/2018 9:37:14 AM This report has been signed electronically. Number of Addenda: 0 Note Initiated On: 05/01/2018 8:43 AM Scope Withdrawal Time: 0 hours 16 minutes 28 seconds  Total Procedure  Duration: 0 hours 24 minutes 12 seconds       [facility]

## 2018-05-01 NOTE — Transfer of Care (Signed)
Immediate Anesthesia Transfer of Care Note  Patient: Morgan [name]  Procedure(s) Performed: COLONOSCOPY WITH PROPOFOL (N/A ) ESOPHAGOGASTRODUODENOSCOPY (EGD) WITH PROPOFOL (N/A )  Patient Location: Endoscopy Unit  Anesthesia Type:General  Level of Consciousness: awake, alert  and patient cooperative  Airway & Oxygen Therapy: Patient Spontanous Breathing and Patient connected to face mask oxygen  Post-op Assessment: Report given to RN and Post -op Vital signs reviewed and stable  Post vital signs: Reviewed and stable  Last Vitals:  Vitals Value Taken Time  BP 121/63 05/01/2018  9:36 AM  Temp    Pulse 85 05/01/2018  9:37 AM  Resp 18 05/01/2018  9:37 AM  SpO2 100 % 05/01/2018  9:37 AM  Vitals shown include unvalidated device data.  Last Pain:  Vitals:   05/01/18 0831  TempSrc: Oral  PainSc: 0-No pain         Complications: No apparent anesthesia complications

## 2018-05-01 NOTE — Anesthesia Preprocedure Evaluation (Addendum)
Anesthesia Evaluation  Patient identified by MRN, date of birth, ID band Patient awake    Reviewed: Allergy & Precautions, H&P , NPO status , Patient's Chart, lab work & pertinent test results  Airway Mallampati: III  TM Distance: >3 FB     Dental  (+) Teeth Intact   Pulmonary neg pulmonary ROS, neg COPD,           Cardiovascular hypertension, (-) angina(-) Past MI and (-) Cardiac Stents (-) dysrhythmias      Neuro/Psych  Headaches, PSYCHIATRIC DISORDERS Anxiety Depression Bipolar Disorder    GI/Hepatic Neg liver ROS, GERD  ,  Endo/Other  diabetes  Renal/GU negative Renal ROS  negative genitourinary   Musculoskeletal   Abdominal   Peds  Hematology negative hematology ROS (+)   Anesthesia Other Findings Obese  Past Medical History: No date: Anxiety No date: Bipolar disorder (HCC) No date: Breast mass     Comment:  right No date: Cervical dysplasia No date: Diabetes mellitus without complication (HCC) No date: Endometriosis No date: GERD (gastroesophageal reflux disease) No date: Hypercholesteremia No date: Hypertension No date: Migraine No date: Pelvic kidney     Comment:  left No date: Postpartum depression     Comment:  hx of  Past Surgical History: 2014: BACK SURGERY 10/28/2014: BREAST LUMPECTOMY WITH RADIOACTIVE SEED LOCALIZATION; Right     Comment:  Procedure: RIGHT BREAST LUMPECTOMY WITH RADIOACTIVE SEED              LOCALIZATION;  Surgeon: [name], MD;  Location:               [place];  Service: General;                Laterality: Right; 03: CESAREAN SECTION No date: COLPOSCOPY No date: GYNECOLOGIC CRYOSURGERY 2012: NECK SURGERY 89: PELVIC LAPAROSCOPY  BMI    Body Mass Index:  34.96 kg/m      Reproductive/Obstetrics negative OB ROS                            Anesthesia Physical Anesthesia Plan  ASA: III  Anesthesia Plan: General    Post-op Pain Management:    Induction:   PONV Risk Score and Plan: Propofol infusion and TIVA  Airway Management Planned: Nasal Cannula  Additional Equipment:   Intra-op Plan:   Post-operative Plan:   Informed Consent: I have reviewed the patients History and Physical, chart, labs and discussed the procedure including the risks, benefits and alternatives for the proposed anesthesia with the patient or authorized representative who has indicated his/her understanding and acceptance.     Dental Advisory Given  Plan Discussed with: Anesthesiologist and CRNA  Anesthesia Plan Comments:         Anesthesia Quick Evaluation

## 2018-05-01 NOTE — Anesthesia Postprocedure Evaluation (Signed)
Anesthesia Post Note  Patient: Morgan [name]  Procedure(s) Performed: COLONOSCOPY WITH PROPOFOL (N/A ) ESOPHAGOGASTRODUODENOSCOPY (EGD) WITH PROPOFOL (N/A )  Patient location during evaluation: PACU Anesthesia Type: General Level of consciousness: awake and alert Pain management: pain level controlled Vital Signs Assessment: post-procedure vital signs reviewed and stable Respiratory status: spontaneous breathing, nonlabored ventilation and respiratory function stable Cardiovascular status: blood pressure returned to baseline and stable Postop Assessment: no apparent nausea or vomiting Anesthetic complications: no     Last Vitals:  Vitals:   05/01/18 0831 05/01/18 0936  BP: 118/73 121/63  Pulse: 72 86  Resp: 16 14  Temp: 36.6 C (!) 36.1 C  SpO2: 100% 99%    Last Pain:  Vitals:   05/01/18 0955  TempSrc:   PainSc: 0-No pain                 Durenda Hurt

## 2018-05-01 NOTE — H&P (Signed)
[name], MD [address] [place], [place], [place], [address] [address], [place], [place], [place], [address] Phone: [phone]  Fax: [phone]  Primary Care Physician:  Scot Jun, FNP   Pre-Procedure History & Physical: HPI:  Morgan [name] is a 53 y.o. female is here for an endoscopy    Past Medical History:  Diagnosis Date   Anxiety    Bipolar disorder ([place])    Breast mass    right   Cervical dysplasia    Diabetes mellitus without complication ([place])    Endometriosis    GERD (gastroesophageal reflux disease)    Hypercholesteremia    Hypertension    Migraine    Pelvic kidney    left   Postpartum depression    hx of    Past Surgical History:  Procedure Laterality Date   BACK SURGERY  2014   BREAST LUMPECTOMY WITH RADIOACTIVE SEED LOCALIZATION Right 10/28/2014   Procedure: RIGHT BREAST LUMPECTOMY WITH RADIOACTIVE SEED LOCALIZATION;  Surgeon: [name], MD;  Location: [place];  Service: General;  Laterality: Right;   CESAREAN SECTION  03   COLPOSCOPY     GYNECOLOGIC CRYOSURGERY     NECK SURGERY  2012   PELVIC LAPAROSCOPY  89    Prior to Admission medications   Medication Sig Start Date End Date Taking? Authorizing Provider  ARIPiprazole (ABILIFY) 15 MG tablet Take 1 tablet by mouth at bedtime. 12/21/17  Yes Noemi Chapel, NP  atorvastatin (LIPITOR) 10 MG tablet Take 10 mg by mouth at bedtime. 11/04/16  Yes [provider]  DULoxetine (CYMBALTA) 60 MG capsule Take 60 mg by mouth at bedtime. 11/04/16  Yes [provider]  losartan (COZAAR) 100 MG tablet Take 1 tablet daily by mouth. 03/21/18  Yes Scot Jun, FNP  metFORMIN (GLUCOPHAGE) 1000 MG tablet Take 1 tablet (1,000 mg total) by mouth 2 (two) times daily. 06/19/17  Yes Mariel Aloe, MD  nystatin-triamcinolone ointment [facility]) Apply 1 application topically 2 (two) times daily. 04/05/18  Yes Huel Cote,  NP  omeprazole (PRILOSEC) 20 MG capsule Take 20 mg by mouth at bedtime.    Yes [provider]  diazepam (VALIUM) 5 MG tablet Take 1 tablet (5 mg total) by mouth every 8 (eight) hours as needed for muscle spasms. Patient not taking: Reported on 05/01/2018 03/12/18   Earleen Newport, MD  dicyclomine (BENTYL) 20 MG tablet Take 1 tablet (20 mg total) by mouth 2 (two) times daily. Patient not taking: Reported on 05/01/2018 04/10/18   [name], MD  pantoprazole (PROTONIX) 20 MG tablet Take 1 tablet (20 mg total) by mouth daily for 30 days. 03/28/18 04/27/18  Long, Wonda Olds, MD  sucralfate (CARAFATE) 1 g tablet Take 1 tablet (1 g total) by mouth 4 (four) times daily -  with meals and at bedtime for 7 days. 03/28/18 04/04/18  LongWonda Olds, MD    Allergies as of 04/11/2018 - Review Complete 04/10/2018  Allergen Reaction Noted   Lamotrigine Rash 08/29/2014    Family History  Problem Relation Age of Onset   Diabetes Mother    Breast cancer Mother 16   Thyroid disease Mother    Depression Mother    Hypertension Father    Heart disease Father    Heart attack Father 100       Two instances, first at age 46   Stroke Father  Alcohol abuse Father    Throat cancer Father    Diabetes Maternal Grandmother    Depression Maternal Grandmother    Uterine cancer Maternal Grandmother     Social History   Socioeconomic History   Marital status: Married    Spouse name: Not on file   Number of children: Not on file   Years of education: Not on file   Highest education level: Not on file  Occupational History   Not on file  Social Needs   Financial resource strain: Not on file   Food insecurity:    Worry: Not on file    Inability: Not on file   Transportation needs:    Medical: Not on file    Non-medical: Not on file  Tobacco Use   Smoking status: Never Smoker   Smokeless tobacco: Never Used  Substance and Sexual Activity   Alcohol use: No   Drug use:  No   Sexual activity: Yes    Birth control/protection: Other-see comments    Comment: vasectomy  Lifestyle   Physical activity:    Days per week: Not on file    Minutes per session: Not on file   Stress: Not on file  Relationships   Social connections:    Talks on phone: Not on file    Gets together: Not on file    Attends religious service: Not on file    Active member of club or organization: Not on file    Attends meetings of clubs or organizations: Not on file    Relationship status: Not on file   Intimate partner violence:    Fear of current or ex partner: Not on file    Emotionally abused: Not on file    Physically abused: Not on file    Forced sexual activity: Not on file  Other Topics Concern   Not on file  Social History Narrative   Not on file    Review of Systems: See HPI, otherwise negative ROS  Physical Exam: BP 118/73    Pulse 72    Temp 97.9 F (36.6 C) (Oral)    Resp 16    Ht 5\' 1"  (1.549 m)    Wt 83.9 kg    LMP 05/12/2011 Comment: no period in 7 years    SpO2 100%    BMI 34.96 kg/m  General:   Alert,  pleasant and cooperative in NAD Head:  Normocephalic and atraumatic. Neck:  Supple; no masses or thyromegaly. Lungs:  Clear throughout to auscultation, normal respiratory effort.    Heart:  +S1, +S2, Regular rate and rhythm, No edema. Abdomen:  Soft, nontender and nondistended. Normal bowel sounds, without guarding, and without rebound.   Neurologic:  Alert and  oriented x4;  grossly normal neurologically.  Impression/Plan: Morgan [name] is here for an endoscopy  to be performed for  evaluation of abdominal pain and colon cancer screening    Risks, benefits, limitations, and alternatives regarding endoscopy have been reviewed with the patient.  Questions have been answered.  All parties agreeable.   [name], MD  05/01/2018, 8:50 AM

## 2018-05-01 NOTE — Op Note (Addendum)
[facility] Gastroenterology Patient Name: Morgan [name] Procedure Date: 05/01/2018 8:43 AM MRN: [id number] Account #: [id number] Date of Birth: [birth year] Admit Type: Outpatient Age: 53 Room: [facility] ENDO ROOM 4 Gender: Female Note Status: Finalized Procedure:            Upper GI endoscopy Indications:          Abdominal pain Providers:            [name] MD, MD Referring MD:         Sedalia Muta (Referring MD) Medicines:            Monitored Anesthesia Care Complications:        No immediate complications. Procedure:            Pre-Anesthesia Assessment:                       - Prior to the procedure, a History and Physical was                        performed, and patient medications, allergies and                        sensitivities were reviewed. The patient's tolerance of                        previous anesthesia was reviewed.                       - The risks and benefits of the procedure and the                        sedation options and risks were discussed with the                        patient. All questions were answered and informed                        consent was obtained.                       - ASA Grade Assessment: II - A patient with mild                        systemic disease.                       After obtaining informed consent, the endoscope was                        passed under direct vision. Throughout the procedure,                        the patient's blood pressure, pulse, and oxygen                        saturations were monitored continuously. The Endoscope                        was introduced through the mouth, and advanced to the  third part of duodenum. The upper GI endoscopy was                        accomplished with ease. The patient tolerated the                        procedure well. Findings:      The examined duodenum was normal.      The esophagus was normal.  Localized moderate inflammation characterized by congestion (edema) and       erythema was found in the gastric antrum. Biopsies were taken with a       cold forceps for histology.      The cardia and gastric fundus were normal on retroflexion. Impression:           - Normal examined duodenum.                       - Normal esophagus.                       - Gastritis. Biopsied. Recommendation:       - Await pathology results.                       - Perform a colonoscopy today. Procedure Code(s):    --- Professional ---                       [phone], Esophagogastroduodenoscopy, flexible, transoral;                        with biopsy, single or multiple Diagnosis Code(s):    --- Professional ---                       K29.70, Gastritis, unspecified, without bleeding                       R10.9, Unspecified abdominal pain CPT copyright 2018 American Medical Association. All rights reserved. The codes documented in this report are preliminary and upon coder review may  be revised to meet current compliance requirements. [name], MD [name] MD, MD 05/01/2018 9:03:43 AM This report has been signed electronically. Number of Addenda: 0 Note Initiated On: 05/01/2018 8:43 AM      [facility]

## 2018-05-01 NOTE — Op Note (Addendum)
[facility] Gastroenterology Patient Name: Morgan [name] Procedure Date: 05/01/2018 8:43 AM MRN: [id number] Account #: [id number] Date of Birth: [birth year] Admit Type: Outpatient Age: 53 Room: [facility] ENDO ROOM 4 Gender: Female Note Status: Finalized Procedure:            Upper GI endoscopy Indications:          Abdominal pain Providers:            [name] MD, MD Referring MD:         Sedalia Muta (Referring MD) Medicines:            Monitored Anesthesia Care Complications:        No immediate complications. Procedure:            Pre-Anesthesia Assessment:                       - Prior to the procedure, a History and Physical was                        performed, and patient medications, allergies and                        sensitivities were reviewed. The patient's tolerance of                        previous anesthesia was reviewed.                       - The risks and benefits of the procedure and the                        sedation options and risks were discussed with the                        patient. All questions were answered and informed                        consent was obtained.                       - ASA Grade Assessment: II - A patient with mild                        systemic disease.                       After obtaining informed consent, the endoscope was                        passed under direct vision. Throughout the procedure,                        the patient's blood pressure, pulse, and oxygen                        saturations were monitored continuously. The Endoscope                        was introduced through the mouth, and advanced to the  third part of duodenum. The upper GI endoscopy was                        accomplished with ease. The patient tolerated the                        procedure well. Findings:      The examined duodenum was normal.      The esophagus was normal.  Localized moderate inflammation characterized by congestion (edema) and       erythema was found in the gastric antrum. Biopsies were taken with a       cold forceps for histology.      The cardia and gastric fundus were normal on retroflexion. Impression:           - Normal examined duodenum.                       - Normal esophagus.                       - Gastritis. Biopsied. Recommendation:       - Await pathology results.                       - Perform a colonoscopy today. Procedure Code(s):    --- Professional ---                       [phone], Esophagogastroduodenoscopy, flexible, transoral;                        with biopsy, single or multiple Diagnosis Code(s):    --- Professional ---                       K29.70, Gastritis, unspecified, without bleeding                       R10.9, Unspecified abdominal pain CPT copyright 2018 American Medical Association. All rights reserved. The codes documented in this report are preliminary and upon coder review may  be revised to meet current compliance requirements. [name], MD [name] MD, MD 05/01/2018 9:03:43 AM This report has been signed electronically. Number of Addenda: 0 Note Initiated On: 05/01/2018 8:43 AM      [facility]

## 2018-05-01 NOTE — Anesthesia Post-op Follow-up Note (Signed)
Anesthesia QCDR form completed.        

## 2018-05-02 ENCOUNTER — Telehealth: Payer: Self-pay

## 2018-05-02 ENCOUNTER — Other Ambulatory Visit: Payer: Federal, State, Local not specified - PPO

## 2018-05-02 ENCOUNTER — Ambulatory Visit: Payer: Federal, State, Local not specified - PPO | Admitting: Gynecology

## 2018-05-02 LAB — SURGICAL PATHOLOGY

## 2018-05-02 NOTE — Telephone Encounter (Signed)
Spoke with pt and informed her of lab results and Dr. Georgeann Oppenheim instructions for pt to receive the Hepatitis A/B vaccine. I explained that we offer the Hep A/B vaccine in our office. Pt states she plans to receive the vaccine during her next office visit with Dr. Vicente Males.

## 2018-05-02 NOTE — Telephone Encounter (Signed)
-----   Message from [name], MD sent at 04/27/2018 11:25 AM EDT ----- Sherald Hess inform needs Hep A/B vaccine, negative celiac and H pylori tests

## 2018-05-15 ENCOUNTER — Ambulatory Visit: Payer: Federal, State, Local not specified - PPO | Admitting: Gastroenterology

## 2018-05-16 ENCOUNTER — Encounter: Payer: Federal, State, Local not specified - PPO | Admitting: Women's Health

## 2018-05-24 ENCOUNTER — Other Ambulatory Visit: Payer: Federal, State, Local not specified - PPO

## 2018-05-24 ENCOUNTER — Ambulatory Visit: Payer: Federal, State, Local not specified - PPO | Admitting: Gynecology

## 2018-06-12 ENCOUNTER — Emergency Department (HOSPITAL_COMMUNITY): Payer: Federal, State, Local not specified - PPO

## 2018-06-12 ENCOUNTER — Emergency Department (HOSPITAL_COMMUNITY)
Admission: EM | Admit: 2018-06-12 | Discharge: 2018-06-12 | Disposition: A | Discharge status: Home / Self Care | Payer: Federal, State, Local not specified - PPO | Attending: Emergency Medicine | Admitting: Emergency Medicine

## 2018-06-12 ENCOUNTER — Other Ambulatory Visit: Payer: Self-pay

## 2018-06-12 ENCOUNTER — Encounter (HOSPITAL_COMMUNITY): Payer: Self-pay | Admitting: Pharmacy Technician

## 2018-06-12 DIAGNOSIS — E119 Type 2 diabetes mellitus without complications: Secondary | ICD-10-CM | POA: Insufficient documentation

## 2018-06-12 DIAGNOSIS — Z79899 Other long term (current) drug therapy: Secondary | ICD-10-CM | POA: Insufficient documentation

## 2018-06-12 DIAGNOSIS — I10 Essential (primary) hypertension: Secondary | ICD-10-CM | POA: Insufficient documentation

## 2018-06-12 DIAGNOSIS — Z7984 Long term (current) use of oral hypoglycemic drugs: Secondary | ICD-10-CM | POA: Insufficient documentation

## 2018-06-12 DIAGNOSIS — R079 Chest pain, unspecified: Secondary | ICD-10-CM | POA: Insufficient documentation

## 2018-06-12 LAB — BASIC METABOLIC PANEL
Anion gap: 12 (ref 5–15)
BUN: 10 mg/dL (ref 6–20)
CO2: 26 mmol/L (ref 22–32)
Calcium: 9.5 mg/dL (ref 8.9–10.3)
Chloride: 103 mmol/L (ref 98–111)
Creatinine, Ser: 0.71 mg/dL (ref 0.44–1.00)
GFR calc Af Amer: >60 mL/min (ref >60)
GFR calc non Af Amer: >60 mL/min (ref >60)
Glucose, Bld: 112 mg/dL — ABNORMAL HIGH (ref 70–99)
Potassium: 3.6 mmol/L (ref 3.5–5.1)
Sodium: 141 mmol/L (ref 135–145)

## 2018-06-12 LAB — CBC WITH DIFFERENTIAL/PLATELET
Abs Immature Granulocytes: 0.03 10*3/uL (ref 0.00–0.07)
Basophils Absolute: 0.1 10*3/uL (ref 0.0–0.1)
Basophils Relative: 1 %
Eosinophils Absolute: 0.3 10*3/uL (ref 0.0–0.5)
Eosinophils Relative: 4 %
HCT: 38.2 % (ref 36.0–46.0)
Hemoglobin: 12.4 g/dL (ref 12.0–15.0)
Immature Granulocytes: 0 %
Lymphocytes Relative: 34 %
Lymphs Abs: 2.5 10*3/uL (ref 0.7–4.0)
MCH: 26.8 pg (ref 26.0–34.0)
MCHC: 32.5 g/dL (ref 30.0–36.0)
MCV: 82.7 fL (ref 80.0–100.0)
Monocytes Absolute: 0.3 10*3/uL (ref 0.1–1.0)
Monocytes Relative: 4 %
Neutro Abs: 4.3 10*3/uL (ref 1.7–7.7)
Neutrophils Relative %: 57 %
Platelets: 199 10*3/uL (ref 150–400)
RBC: 4.62 MIL/uL (ref 3.87–5.11)
RDW: 14 % (ref 11.5–15.5)
WBC: 7.4 10*3/uL (ref 4.0–10.5)
nRBC: 0 % (ref 0.0–0.2)

## 2018-06-12 LAB — TROPONIN I: Troponin I: <0.03 ng/mL (ref <0.03)

## 2018-06-12 NOTE — Discharge Instructions (Addendum)
As discussed, your evaluation today has been largely reassuring.  But, it is important that you monitor your condition carefully, and do not hesitate to return to the ED if you develop new, or concerning changes in your condition. ? ?Otherwise, please follow-up with your physician for appropriate ongoing care. ? ?

## 2018-06-12 NOTE — ED Provider Notes (Signed)
[place] EMERGENCY DEPARTMENT Provider Note   CSN: [id number] Arrival date & time: 06/12/18  1317    History   Chief Complaint Chief Complaint  Patient presents with  . Chest Pain    HPI Morgan [name] is a 53 y.o. female.     HPI Patient presents with chest pain.  Dull in her mid chest.  Began around 11:00 last night.  Is been constant.  Worse with pushing on her chest.  Not worse with exertion.  No nausea or vomiting.  No diaphoresis.  No real radiation.  Has had pain somewhat like this before with a negative cardiac work-up around a year ago.  No fevers or cough.  Did have a nitroglycerin by EMS that helped the pain some.  No abdominal pain.  No swelling in her legs.  No personal cardiac history but does have a family history of cardiac disease. Past Medical History:  Diagnosis Date  . Anxiety   . Bipolar disorder ([place])   . Breast mass    right  . Cervical dysplasia   . Diabetes mellitus without complication ([place])   . Endometriosis   . GERD (gastroesophageal reflux disease)   . Hypercholesteremia   . Hypertension   . Migraine   . Pelvic kidney    left  . Postpartum depression    hx of    Patient Active Problem List   Diagnosis Date Noted  . Chest pain 06/14/2017  . Hypertension complicating diabetes ([place]) 06/14/2017  . Type 2 diabetes mellitus with hyperlipidemia ([place]) 06/14/2017  . Hyperlipidemia associated with type 2 diabetes mellitus ([place]) 06/14/2017  . Cervical dysplasia   . Bipolar disorder [facility])     Past Surgical History:  Procedure Laterality Date  . BACK SURGERY  2014  . BREAST LUMPECTOMY WITH RADIOACTIVE SEED LOCALIZATION Right 10/28/2014   Procedure: RIGHT BREAST LUMPECTOMY WITH RADIOACTIVE SEED LOCALIZATION;  Surgeon: [name], MD;  Location: [facility];  Service: General;  Laterality: Right;  . CESAREAN SECTION  03  . COLONOSCOPY WITH PROPOFOL N/A 05/01/2018   Procedure: COLONOSCOPY WITH PROPOFOL;   Surgeon: [name], MD;  Location: [facility] ENDOSCOPY;  Service: Gastroenterology;  Laterality: N/A;  . COLPOSCOPY    . ESOPHAGOGASTRODUODENOSCOPY (EGD) WITH PROPOFOL N/A 05/01/2018   Procedure: ESOPHAGOGASTRODUODENOSCOPY (EGD) WITH PROPOFOL;  Surgeon: [name], MD;  Location: [facility] ENDOSCOPY;  Service: Gastroenterology;  Laterality: N/A;  . GYNECOLOGIC CRYOSURGERY    . NECK SURGERY  2012  . PELVIC LAPAROSCOPY  38     OB History    Gravida  1   Para  1   Term  1   Preterm      AB      Living  1     SAB      TAB      Ectopic      Multiple      Live Births               Home Medications    Prior to Admission medications   Medication Sig Start Date End Date Taking? Authorizing Provider  ARIPiprazole (ABILIFY) 15 MG tablet Take 1 tablet by mouth at bedtime. 12/21/17   Noemi Chapel, NP  atorvastatin (LIPITOR) 10 MG tablet Take 10 mg by mouth at bedtime. 11/04/16   [provider]  diazepam (VALIUM) 5 MG tablet Take 1 tablet (5 mg total) by mouth every 8 (eight) hours as needed for muscle spasms. Patient not taking: Reported  on 05/01/2018 03/12/18   Earleen Newport, MD  dicyclomine (BENTYL) 20 MG tablet Take 1 tablet (20 mg total) by mouth 2 (two) times daily. Patient not taking: Reported on 05/01/2018 04/10/18   [name], MD  DULoxetine (CYMBALTA) 60 MG capsule Take 60 mg by mouth at bedtime. 11/04/16   [provider]  losartan (COZAAR) 100 MG tablet Take 1 tablet daily by mouth. 03/21/18   Scot Jun, FNP  metFORMIN (GLUCOPHAGE) 1000 MG tablet Take 1 tablet (1,000 mg total) by mouth 2 (two) times daily. 06/19/17   Mariel Aloe, MD  nystatin-triamcinolone ointment [facility]) Apply 1 application topically 2 (two) times daily. 04/05/18   Huel Cote, NP  omeprazole (PRILOSEC) 20 MG capsule Take 20 mg by mouth at bedtime.     [provider]  pantoprazole (PROTONIX) 20 MG tablet Take 1 tablet (20 mg total) by mouth daily for 30 days.  03/28/18 04/27/18  Long, Wonda Olds, MD  sucralfate (CARAFATE) 1 g tablet Take 1 tablet (1 g total) by mouth 4 (four) times daily -  with meals and at bedtime for 7 days. 03/28/18 04/04/18  Long, Wonda Olds, MD    Family History Family History  Problem Relation Age of Onset  . Diabetes Mother   . Breast cancer Mother 92  . Thyroid disease Mother   . Depression Mother   . Hypertension Father   . Heart disease Father   . Heart attack Father 33       Two instances, first at age 60  . Stroke Father   . Alcohol abuse Father   . Throat cancer Father   . Diabetes Maternal Grandmother   . Depression Maternal Grandmother   . Uterine cancer Maternal Grandmother     Social History Social History   Tobacco Use  . Smoking status: Never Smoker  . Smokeless tobacco: Never Used  Substance Use Topics  . Alcohol use: No  . Drug use: No     Allergies   Lamotrigine   Review of Systems Review of Systems  Constitutional: Negative for appetite change.  HENT: Negative for congestion.   Respiratory: Negative for shortness of breath.   Cardiovascular: Positive for chest pain.  Gastrointestinal: Negative for abdominal pain.  Genitourinary: Negative for flank pain.  Musculoskeletal: Negative for back pain.  Skin: Negative for rash.  Neurological: Negative for weakness.  Psychiatric/Behavioral: Negative for confusion.     Physical Exam Updated Vital Signs BP 114/77   Pulse 89   Resp (!) 23   Ht 5\' 1"  (1.549 m)   Wt 81.6 kg   LMP 05/12/2011 Comment: no period in 7 years   SpO2 97%   BMI 34.01 kg/m   Physical Exam Vitals signs and nursing note reviewed.  HENT:     Head: Normocephalic.  Eyes:     Pupils: Pupils are equal, round, and reactive to light.  Cardiovascular:     Rate and Rhythm: Normal rate and regular rhythm.  Chest:     Chest wall: Tenderness present.     Comments: Anterior chest wall tenderness. Abdominal:     Tenderness: There is no abdominal tenderness.   Musculoskeletal:     Right lower leg: She exhibits no tenderness.     Left lower leg: She exhibits no tenderness.  Skin:    Capillary Refill: Capillary refill takes less than 2 seconds.     Coloration: Skin is cyanotic.  Neurological:     Mental Status: She is alert.  ED Treatments / Results  Labs (all labs ordered are listed, but only abnormal results are displayed) Labs Reviewed  BASIC METABOLIC PANEL  CBC WITH DIFFERENTIAL/PLATELET  TROPONIN I    EKG EKG Interpretation  Date/Time:  Monday June 12 2018 13:22:15 EDT Ventricular Rate:  97 PR Interval:    QRS Duration: 81 QT Interval:  330 QTC Calculation: 420 R Axis:   53 Text Interpretation:  Sinus rhythm Confirmed by Davonna Belling [phone]) on 06/12/2018 3:10:20 PM   Radiology Dg Chest 2 View  Result Date: 06/12/2018 CLINICAL DATA:  Left-sided chest pain and arm pain. EXAM: CHEST - 2 VIEW COMPARISON:  06/14/2017 FINDINGS: The heart size and mediastinal contours are within normal limits. There is no evidence of pulmonary edema, consolidation, pneumothorax, nodule or pleural fluid. The visualized skeletal structures are unremarkable. IMPRESSION: No active cardiopulmonary disease. Electronically Signed   By: Aletta Edouard M.D.   On: 06/12/2018 14:41    Procedures Procedures (including critical care time)  Medications Ordered in ED Medications - No data to display   Initial Impression / Assessment and Plan / ED Course  I have reviewed the triage vital signs and the nursing notes.  Pertinent labs & imaging results that were available during my care of the patient were reviewed by me and considered in my medical decision making (see chart for details).       Patient with chest pain.  Dull anterior chest.  EKG reassuring.  X-ray reassuring.  Lab work negative for likely be able to discharge home.  Care turned over to Dr. Vanita Panda.  Final Clinical Impressions(s) / ED Diagnoses   Final diagnoses:   Nonspecific chest pain    ED Discharge Orders    None       Davonna Belling, MD 06/12/18 1510

## 2018-06-12 NOTE — ED Provider Notes (Signed)
Patient aware of lab findings, in no distress, hemodynamically unremarkable.   Carmin Muskrat, MD 06/12/18 [phone]

## 2018-06-12 NOTE — ED Triage Notes (Signed)
Pt bib ems from home with reports of mid sternal cp last night. Described as pressure. Reports pain has stayed the same. Pt also c/o headache. Given 3 sl nitro by ems pain down to 3/10. Pt took 324mg  asa prior to ems arrival. 12 lead unremarkable. 120/72, HR 90, 98% RA, CBG 129. 20g RAC.

## 2018-07-05 ENCOUNTER — Ambulatory Visit: Payer: Federal, State, Local not specified - PPO | Admitting: Gastroenterology

## 2018-07-06 ENCOUNTER — Telehealth: Payer: Self-pay | Admitting: Gastroenterology

## 2018-07-06 ENCOUNTER — Telehealth: Payer: Self-pay

## 2018-07-06 NOTE — Telephone Encounter (Signed)
Called pt to inform her of Dr. Georgeann Oppenheim instructions for pt to follow up with him to discuss EGD results.  Unable to contact, LVM to return call

## 2018-07-06 NOTE — Telephone Encounter (Signed)
-----   Message from [name], MD sent at 07/06/2018  9:39 AM EDT ----- Needs office follow up - web visit ok , discuss how she is doing , EGD results. Next steps

## 2018-07-06 NOTE — Telephone Encounter (Signed)
Pt left vm returning a call for  [place] for results

## 2018-07-06 NOTE — Progress Notes (Signed)
Needs office follow up - web visit ok , discuss how she is doing , EGD results. Next steps

## 2018-07-06 NOTE — Telephone Encounter (Signed)
Left vm for pt to call office and schedule virtual visit with Dr. Vicente Males

## 2018-07-06 NOTE — Telephone Encounter (Signed)
Spoke with pt to schedule Virtual visit with Dr. Vicente Males she states she will call me back next week to schedule this apt

## 2018-07-11 NOTE — Telephone Encounter (Signed)
Pt left vm for [place]

## 2018-07-11 NOTE — Telephone Encounter (Signed)
Spoke with pt and informed her of Dr. Georgeann Oppenheim directions for pt to schedule a follow up with him to discuss results and next steps. Pt states she'll contact our office later in the week to schedule.

## 2018-08-05 ENCOUNTER — Encounter: Payer: Self-pay | Admitting: Family Medicine

## 2018-08-10 ENCOUNTER — Ambulatory Visit: Payer: Federal, State, Local not specified - PPO | Admitting: Gastroenterology

## 2018-08-10 ENCOUNTER — Encounter: Payer: Self-pay | Admitting: Gastroenterology

## 2018-08-10 ENCOUNTER — Other Ambulatory Visit: Payer: Self-pay

## 2018-08-10 VITALS — BP 127/84 | HR 99 | Temp 97.5°F | Ht 63.0 in | Wt 186.6 lb

## 2018-08-10 DIAGNOSIS — K760 Fatty (change of) liver, not elsewhere classified: Secondary | ICD-10-CM

## 2018-08-10 DIAGNOSIS — K3189 Other diseases of stomach and duodenum: Secondary | ICD-10-CM

## 2018-08-10 DIAGNOSIS — K31A0 Gastric intestinal metaplasia, unspecified: Secondary | ICD-10-CM

## 2018-08-10 DIAGNOSIS — R197 Diarrhea, unspecified: Secondary | ICD-10-CM | POA: Diagnosis not present

## 2018-08-10 DIAGNOSIS — D1803 Hemangioma of intra-abdominal structures: Secondary | ICD-10-CM

## 2018-08-10 DIAGNOSIS — Z23 Encounter for immunization: Secondary | ICD-10-CM

## 2018-08-10 NOTE — Addendum Note (Signed)
Addended by: Dorethea Clan on: 08/10/2018 12:03 PM   Modules accepted: Orders

## 2018-08-10 NOTE — Progress Notes (Signed)
[name] MD, MRCP(U.K) [address]  [place]  [place],  [address]  Main: [phone]  Fax: [phone]   Primary Care Physician: Scot Jun, FNP  Primary Gastroenterologist:  Dr. [name]   Follow-up for fatty liver, liver lesion and abdominal pain, bloating, NSAID use, history of endometriosis.  HPI: Morgan [name] is a 53 y.o. female   Summary of history : She was initially referred and seen on 04/10/2018.She presented to the emergency room on 03/28/2018 with right flank and epigastric pain going on for about 2 weeks.  She underwent a CT scan of the abdomen on 03/15/2018 which demonstrated hepatic steatosis and a 20 mm hyperattenuating focus in the right hepatic lobe.   Subsequent MRI with contrast on 04/05/2018 suggested that it may be a hemangioma and less likely to be focal nodular hyperplasia and the lesion has only minimally increased in size over the last 7 years.  Diffuse hepatic steatosis noted  She says she has had abdominal pain for month at her initial presentation, it is continuous, all day and night , lower abdomen , intensity varies, also has some rt flank Sometimes the backpain radiates anteriorly , pain is not relived by anything , worse eating. A bowel movement does not change it. She says that she has a bowel movement every 2-3 days but not hard. Lots of bloating. She says no worse with the pain when bloating is worse. Lost 6 lbs unintentionally. She takes ibuprofen 2-4 tablets a day for a month. She denies any chest pain. She denies any family history of colon cancer. No prior colonoscopy or EGD. She takes Prilosec at night   She has had endometriosis , menopause 8 years back . Pain was lower back then . She consumes regular Dr Malachi Bonds. No artificial sugars. No chewing gum, no crystalite.   Interval history 04/10/2018-08/10/2018  05/01/2018: EGD: Gastritis noted biopsies showed chronic inactive gastritis and focal goblet cell change which may  reflect intestinal metaplasia., colonoscopy: 2 sessile polyps resected.  Sessile serrated polyp.  An issue today is diarrhea ongoing for more than 3 weeks.  Bloating.  She feels gassy.  She is on metformin extended release thousand milligrams twice daily.  Denies consumption of any artificial sugars.  She has stopped all NSAID use.  Current Outpatient Medications  Medication Sig Dispense Refill  . ARIPiprazole (ABILIFY) 15 MG tablet Take 1 tablet by mouth at bedtime.    Marland Kitchen atorvastatin (LIPITOR) 10 MG tablet Take 10 mg by mouth at bedtime.  5  . diazepam (VALIUM) 5 MG tablet Take 1 tablet (5 mg total) by mouth every 8 (eight) hours as needed for muscle spasms. (Patient not taking: Reported on 05/01/2018) 20 tablet 0  . dicyclomine (BENTYL) 20 MG tablet Take 1 tablet (20 mg total) by mouth 2 (two) times daily. (Patient not taking: Reported on 05/01/2018) 20 tablet 0  . DULoxetine (CYMBALTA) 60 MG capsule Take 60 mg by mouth at bedtime.  5  . losartan (COZAAR) 100 MG tablet Take 1 tablet daily by mouth. 30 tablet 2  . metFORMIN (GLUCOPHAGE) 1000 MG tablet Take 1 tablet (1,000 mg total) by mouth 2 (two) times daily.    Marland Kitchen nystatin-triamcinolone ointment (MYCOLOG) Apply 1 application topically 2 (two) times daily. 30 g 0  . omeprazole (PRILOSEC) 20 MG capsule Take 20 mg by mouth at bedtime.     . pantoprazole (PROTONIX) 20 MG tablet Take 1 tablet (20 mg total) by mouth daily for 30  days. 30 tablet 0  . sucralfate (CARAFATE) 1 g tablet Take 1 tablet (1 g total) by mouth 4 (four) times daily -  with meals and at bedtime for 7 days. 28 tablet 0   No current facility-administered medications for this visit.     Allergies as of 08/10/2018 - Review Complete 06/12/2018  Allergen Reaction Noted  . Lamotrigine Rash 08/29/2014    ROS:  General: Negative for anorexia, weight loss, fever, chills, fatigue, weakness. ENT: Negative for hoarseness, difficulty swallowing , nasal congestion. CV: Negative for  chest pain, angina, palpitations, dyspnea on exertion, peripheral edema.  Respiratory: Negative for dyspnea at rest, dyspnea on exertion, cough, sputum, wheezing.  GI: See history of present illness. GU:  Negative for dysuria, hematuria, urinary incontinence, urinary frequency, nocturnal urination.  Endo: Negative for unusual weight change.    Physical Examination:   LMP 05/12/2011 Comment: no period in 7 years   General: Well-nourished, well-developed in no acute distress.  Eyes: No icterus. Conjunctivae pink. Mouth: Oropharyngeal mucosa moist and pink , no lesions erythema or exudate. Lungs: Clear to auscultation bilaterally. Non-labored. Heart: Regular rate and rhythm, no murmurs rubs or gallops.  Abdomen: Bowel sounds are normal, nontender, nondistended, no hepatosplenomegaly or masses, no abdominal bruits or hernia , no rebound or guarding.   Extremities: No lower extremity edema. No clubbing or deformities. Neuro: Alert and oriented x 3.  Grossly intact. Skin: Warm and dry, no jaundice.   Psych: Alert and cooperative, normal mood and affect.   Imaging Studies: No results found.  Assessment and Plan:   Morgan [name] is a 53 y.o. y/o female here to follow-up for fatty liver, liver lesion and abdominal discomfort.   Not immune to hepatitis A and B.  H. pylori breath test negative.  In March 2020 EGD demonstrated features suggestive of intestinal metaplasia.  Her main issue at this point of time is abdominal pain and diarrhea.  She has been on metformin with change from regular release to extended release and worsening of symptoms.  She is on thousand milligrams twice daily.  Plan 1.  The MRI demonstrates that the liver lesion is a hemangioma which is less than 5 cm in size.  As per recent recommendations no further evaluation is needed but I would probably say that we repeat the MRI in about a year's time that is in February 2021 to make sure that there is no change in the size  of the lesion as there has been a slight increase in size compared to prior evaluation.  2. Fatty liver disease: Limit alcohol consumption and needs hepatitis a and B vaccine.  3. Daily miralax, stop all NSAID's, stop sodas. Low fod map diet   4.  Continue Prilosec, low FODMAP diet, check stool for GI PCR and C. difficile.  If negative will consider stopping metformin as it can cause bloating and diarrhea.  If that is negative then I will treat her for IBS diarrhea.   5.  Possible intestinal metaplasia of the stomach.  Repeat EGD in 11/05/2018 for gastric mapping.    Dr [name]  MD,MRCP [facility]) Follow up in 4 weekd

## 2018-08-21 ENCOUNTER — Ambulatory Visit: Payer: Federal, State, Local not specified - PPO | Admitting: Women's Health

## 2018-08-21 ENCOUNTER — Encounter: Payer: Self-pay | Admitting: Gastroenterology

## 2018-08-21 LAB — GI PROFILE, STOOL, PCR

## 2018-08-21 LAB — C DIFFICILE TOXINS A+B W/RFLX: C difficile Toxins A+B, EIA: NEGATIVE

## 2018-08-21 LAB — C DIFFICILE, CYTOTOXIN B

## 2018-08-24 ENCOUNTER — Encounter: Payer: Self-pay | Admitting: Family Medicine

## 2018-08-24 ENCOUNTER — Ambulatory Visit (INDEPENDENT_AMBULATORY_CARE_PROVIDER_SITE_OTHER): Payer: Federal, State, Local not specified - PPO | Admitting: Family Medicine

## 2018-08-24 ENCOUNTER — Other Ambulatory Visit: Payer: Self-pay

## 2018-08-24 DIAGNOSIS — E1159 Type 2 diabetes mellitus with other circulatory complications: Secondary | ICD-10-CM | POA: Diagnosis not present

## 2018-08-24 DIAGNOSIS — Z1329 Encounter for screening for other suspected endocrine disorder: Secondary | ICD-10-CM

## 2018-08-24 DIAGNOSIS — E1169 Type 2 diabetes mellitus with other specified complication: Secondary | ICD-10-CM

## 2018-08-24 DIAGNOSIS — E785 Hyperlipidemia, unspecified: Secondary | ICD-10-CM

## 2018-08-24 DIAGNOSIS — I10 Essential (primary) hypertension: Secondary | ICD-10-CM

## 2018-08-24 MED ORDER — LOSARTAN POTASSIUM 100 MG PO TABS: ORAL_TABLET | ORAL | 5 refills | Status: DC

## 2018-08-24 NOTE — Progress Notes (Signed)
Virtual Visit via Telephone Note  I connected with Morgan [name] on 08/24/18 at 10:30 AM EDT by telephone and verified that I am speaking with the correct person using two identifiers.  Location: Patient: Located at home during today's encounter  Provider: Located at primary care office    I discussed the limitations, risks, security and privacy concerns of performing an evaluation and management service by telephone and the availability of in person appointments. I also discussed with the patient that there may be a patient responsible charge related to this service. The patient expressed understanding and agreed to proceed.  History of Present Illness:  Hypertension Morgan [name] reports monitors blood pressure at home. All readings with current medications have been at goal of less than 130/90.  Reports adherence to blood pressure medications. Reports efforts to adhere to low sodium diet. She is a nonsmoker. Denies any recent weight changes. Most recent BMI 33.05 (08/10/18). Denies any episodes of dizziness, headaches, shortness of breath, edema, or chest pain.  Type 2 Diabetes  Morgan [name] monitors glucose at home. Home glucose readings are consistently 120's-130's, Last A1C 7.6 back in May 2019. Reports adheres to current medication regimen. However, is experiencing GI upset from Metformin and wishes to change medication. Denies 3'Ps. She is currently prescribed an ACE and ARB. Current with statin therapy. Engages in no routine exercise. Makes efforts to engage in healthy eating choidcand nor adheres to diabetes diet.   Assessment and Plan: 1. Type 2 diabetes mellitus with hyperlipidemia ([place]) Will obtain an updated A1c.  Upon receipt of the A1c advised patient will discuss options of changing to an appropriate medication that is covered by her insurance.  She states that she is unable to afford greater than $30 per month as a co-pay therefore will consult with her pharmacy to  see which medication based on A1c that she can transfer to.  She would benefit from a deep PPI for inhibitor or SGLT2 inhibitor which both have great lowering of A1c effect.  2. Hyperlipidemia associated with type 2 diabetes mellitus ([place]) -Will obtain a fasting lipid, currently prescribed statin therapy  3. Essential hypertension, well controlled We have discussed target BP range and blood pressure goal. I have advised patient to check BP regularly and to call us back or report to clinic if the numbers are consistently higher than 140/90. We discussed the importance of compliance with medical therapy and DASH diet recommended, consequences of uncontrolled hypertension discussed.  - Continue current BP medications.  Follow Up Instructions:    I discussed the assessment and treatment plan with the patient. The patient was provided an opportunity to ask questions and all were answered. The patient agreed with the plan and demonstrated an understanding of the instructions.   The patient was advised to call back or seek an in-person evaluation if the symptoms worsen or if the condition fails to improve as anticipated.  I provided  25 minutes of non-face-to-face time during this encounter.   Molli Barrows, FNP

## 2018-08-25 ENCOUNTER — Other Ambulatory Visit: Payer: Federal, State, Local not specified - PPO

## 2018-08-25 DIAGNOSIS — I10 Essential (primary) hypertension: Secondary | ICD-10-CM

## 2018-08-25 DIAGNOSIS — Z1329 Encounter for screening for other suspected endocrine disorder: Secondary | ICD-10-CM

## 2018-08-25 DIAGNOSIS — E1169 Type 2 diabetes mellitus with other specified complication: Secondary | ICD-10-CM

## 2018-08-25 NOTE — Progress Notes (Signed)
Patient here for fasting labs & BP check. KWalker, CMA.

## 2018-08-26 LAB — CBC WITH DIFFERENTIAL/PLATELET
Basophils Absolute: 0.1 10*3/uL (ref 0.0–0.2)
Basos: 1 %
EOS (ABSOLUTE): 0.3 10*3/uL (ref 0.0–0.4)
Eos: 4 %
Hematocrit: 40.3 % (ref 34.0–46.6)
Hemoglobin: 12.9 g/dL (ref 11.1–15.9)
Immature Grans (Abs): 0 10*3/uL (ref 0.0–0.1)
Immature Granulocytes: 1 %
Lymphocytes Absolute: 1.7 10*3/uL (ref 0.7–3.1)
Lymphs: 25 %
MCH: 26.2 pg — ABNORMAL LOW (ref 26.6–33.0)
MCHC: 32 g/dL (ref 31.5–35.7)
MCV: 82 fL (ref 79–97)
Monocytes Absolute: 0.2 10*3/uL (ref 0.1–0.9)
Monocytes: 3 %
Neutrophils Absolute: 4.4 10*3/uL (ref 1.4–7.0)
Neutrophils: 66 %
Platelets: 239 10*3/uL (ref 150–450)
RBC: 4.92 x10E6/uL (ref 3.77–5.28)
RDW: 14.7 % (ref 11.7–15.4)
WBC: 6.7 10*3/uL (ref 3.4–10.8)

## 2018-08-26 LAB — THYROID PANEL WITH TSH
Free Thyroxine Index: 1.6 (ref 1.2–4.9)
T3 Uptake Ratio: 19 % — ABNORMAL LOW (ref 24–39)
T4, Total: 8.2 ug/dL (ref 4.5–12.0)
TSH: 2.18 u[IU]/mL (ref 0.450–4.500)

## 2018-08-26 LAB — COMPREHENSIVE METABOLIC PANEL
ALT: 55 IU/L — ABNORMAL HIGH (ref 0–32)
AST: 61 IU/L — ABNORMAL HIGH (ref 0–40)
Albumin/Globulin Ratio: 1.6 (ref 1.2–2.2)
Albumin: 4.4 g/dL (ref 3.8–4.8)
Alkaline Phosphatase: 101 IU/L (ref 39–117)
BUN/Creatinine Ratio: 14 (ref 9–23)
BUN: 10 mg/dL (ref 6–24)
Bilirubin Total: 0.5 mg/dL (ref 0.0–1.2)
CO2: 20 mmol/L (ref 20–29)
Calcium: 9.7 mg/dL (ref 8.7–10.2)
Chloride: 101 mmol/L (ref 96–106)
Creatinine, Ser: 0.74 mg/dL (ref 0.57–1.00)
GFR calc Af Amer: 109 mL/min/{1.73_m2} (ref >59)
GFR calc non Af Amer: 95 mL/min/{1.73_m2} (ref >59)
Globulin, Total: 2.7 g/dL (ref 1.5–4.5)
Glucose: 145 mg/dL — ABNORMAL HIGH (ref 65–99)
Potassium: 4.3 mmol/L (ref 3.5–5.2)
Sodium: 141 mmol/L (ref 134–144)
Total Protein: 7.1 g/dL (ref 6.0–8.5)

## 2018-08-26 LAB — LIPID PANEL
Chol/HDL Ratio: 5.4 ratio — ABNORMAL HIGH (ref 0.0–4.4)
Cholesterol, Total: 221 mg/dL — ABNORMAL HIGH (ref 100–199)
HDL: 41 mg/dL (ref >39)
LDL Calculated: 140 mg/dL — ABNORMAL HIGH (ref 0–99)
Triglycerides: 202 mg/dL — ABNORMAL HIGH (ref 0–149)
VLDL Cholesterol Cal: 40 mg/dL (ref 5–40)

## 2018-08-26 LAB — HEMOGLOBIN A1C
Est. average glucose Bld gHb Est-mCnc: 166 mg/dL
Hgb A1c MFr Bld: 7.4 % — ABNORMAL HIGH (ref 4.8–5.6)

## 2018-08-28 ENCOUNTER — Other Ambulatory Visit: Payer: Self-pay | Admitting: Family Medicine

## 2018-08-28 MED ORDER — ATORVASTATIN CALCIUM 40 MG PO TABS: 40.0000 mg | ORAL_TABLET | Freq: Every day | ORAL | 3 refills | Status: DC

## 2018-08-28 MED ORDER — SITAGLIPTIN PHOSPHATE 100 MG PO TABS: 100.0000 mg | ORAL_TABLET | Freq: Every day | ORAL | 11 refills | Status: DC

## 2018-08-28 NOTE — Telephone Encounter (Signed)
Patient notified of lab results & recommendations. Expressed understanding. Is agreeable to medication changes. Prescriptions sent to pharmacy on file.

## 2018-08-28 NOTE — Telephone Encounter (Signed)
Notify patient of the following regarding recent labs: A1c is stable although not at goal at 7.4.  I am discontinuing the metformin as we discussed I did follow-up with [facility] and I can change it to Januvia 100 mg for $35 per month co-pay with your current health insurance.  I will forward the medication over if this is okay.  I also would like to increase your cholesterol remains not at goal.  We need to aggressively reduce your overall cholesterol numbers as this increases your risk of a cardiovascular event.  Would like to increase atorvastatin to 40 mg once daily.  Your liver enzymes are stable although remains slightly elevated however this is been managed by your GI specialist.

## 2018-09-06 ENCOUNTER — Telehealth: Payer: Self-pay | Admitting: Gastroenterology

## 2018-09-06 NOTE — Telephone Encounter (Signed)
Pt is calling about prev. Message she states she has not heard anything and would like a call back today

## 2018-09-06 NOTE — Telephone Encounter (Signed)
Pt is calling she states she has been constipated for 2 weeks and can only use the bathroom when using Miralax she is concerned and would like a call to discuss

## 2018-09-06 NOTE — Telephone Encounter (Signed)
Patients call has been returned regarding constipation.  Patient has been advised to begin taking a fiber supplement such as metamucil up to twice a day, and increase her water intake.  Also advised her that this may make her feel more bloated but it will get better as her bowel elimination gets better.  Asked her to call the office on Monday to let me know if her symptoms have gotten better or worse.  Patient no longer takes metformin.  Thanks [employer]

## 2018-09-11 ENCOUNTER — Ambulatory Visit: Payer: Federal, State, Local not specified - PPO

## 2018-09-13 ENCOUNTER — Ambulatory Visit (INDEPENDENT_AMBULATORY_CARE_PROVIDER_SITE_OTHER): Payer: Federal, State, Local not specified - PPO | Admitting: Gastroenterology

## 2018-09-13 ENCOUNTER — Other Ambulatory Visit: Payer: Self-pay

## 2018-09-13 DIAGNOSIS — K31A0 Gastric intestinal metaplasia, unspecified: Secondary | ICD-10-CM

## 2018-09-13 DIAGNOSIS — R197 Diarrhea, unspecified: Secondary | ICD-10-CM | POA: Diagnosis not present

## 2018-09-13 DIAGNOSIS — K3189 Other diseases of stomach and duodenum: Secondary | ICD-10-CM

## 2018-09-13 NOTE — Progress Notes (Signed)
Morgan [name] , MD [address]  [place]  [place], [place] [address]  Main: [phone]  Fax: [phone]   Primary Care Physician: Morgan Jun, FNP  Virtual Visit via Telephone Note  I connected with patient on 09/13/18 at  8:30 AM EDT by telephone and verified that I am speaking with the correct person using two identifiers.   I discussed the limitations, risks, security and privacy concerns of performing an evaluation and management service by telephone and the availability of in person appointments. I also discussed with the patient that there may be a patient responsible charge related to this service. The patient expressed understanding and agreed to proceed.  Location of Patient: Home Location of Provider: Home Persons involved: Patient and provider only   History of Present Illness: Chief Complaint  Patient presents with  . Follow-up    Intestinal metaplasia    HPI: Morgan [name] is a 53 y.o. female HPI: Morgan [name] is a 53 y.o. female   Summary of history : She was initially referred and seen on 04/10/2018.She presented to the emergency room on 03/28/2018 with right flank and epigastric pain going on for about 2 weeks.She underwent a CT scan of the abdomen on 03/15/2018 which demonstrated hepatic steatosis and a 20 mm hyperattenuating focus in the right hepatic lobe.  Subsequent MRI with contrast on 04/05/2018 suggested that it may be a hemangioma and less likely to be focal nodular hyperplasia and the lesion has only minimally increased in size over the last 7 years. Diffuse hepatic steatosis noted  She says she has had abdominal pain for month at her initial presentation, it wascontinuous, all day and night , lower abdomen , intensity varies, also has some rt flank Sometimes the backpain radiates anteriorly , pain is not relived by anything , worse eating. A bowel movement does not change it. She says that she has a bowel movement every 2-3 days  but not hard. Lots of bloating. She says no worse with the pain when bloating is worse. Lost 6 lbs unintentionally. She takes ibuprofen 2-4 tablets a day for a month. She denies any chest pain. She denies any family history of colon cancer. No prior colonoscopy or EGD. She takes Prilosec at night   She has had endometriosis , menopause 8 years back . Pain was lower back then . She consumes regular Dr Malachi Bonds. No artificial sugars. No chewing gum, no crystalite.  05/01/2018: EGD: Gastritis noted biopsies showed chronic inactive gastritis and focal goblet cell change which may reflect intestinal metaplasia., colonoscopy: 2 sessile polyps resected.  Sessile serrated polyp.  Interval history 08/10/2018-09/13/2018  08/15/2018: GI pcr and c diff test for stool negative  Diarrhea stopped after d/c metformin by Morgan Jun, FNP . No new issues   Current Outpatient Medications  Medication Sig Dispense Refill  . ARIPiprazole (ABILIFY) 15 MG tablet Take 1 tablet by mouth at bedtime.    Marland Kitchen atorvastatin (LIPITOR) 40 MG tablet Take 1 tablet (40 mg total) by mouth daily. 90 tablet 3  . DULoxetine (CYMBALTA) 60 MG capsule Take 60 mg by mouth at bedtime.  5  . losartan (COZAAR) 100 MG tablet Take 1 tablet daily by mouth. 30 tablet 5  . omeprazole (PRILOSEC) 20 MG capsule Take 20 mg by mouth at bedtime.     . sitaGLIPtin (JANUVIA) 100 MG tablet Take 1 tablet (100 mg total) by mouth daily. 30 tablet 11   No current facility-administered medications for  this visit.     Allergies as of 09/13/2018 - Review Complete 09/13/2018  Allergen Reaction Noted  . Lamotrigine Rash 08/29/2014    Review of Systems:    All systems reviewed and negative except where noted in HPI.   Observations/Objective:  Labs: CMP     Component Value Date/Time   NA 141 08/25/2018 1120   NA 138 11/02/2013 1341   K 4.3 08/25/2018 1120   K 3.9 11/02/2013 1341   CL 101 08/25/2018 1120   CL 106 11/02/2013 1341   CO2 20  08/25/2018 1120   CO2 28 11/02/2013 1341   GLUCOSE 145 (H) 08/25/2018 1120   GLUCOSE 112 (H) 06/12/2018 1347   GLUCOSE 129 (H) 11/02/2013 1341   BUN 10 08/25/2018 1120   BUN 9 11/02/2013 1341   CREATININE 0.74 08/25/2018 1120   CREATININE 0.66 11/02/2013 1341   CALCIUM 9.7 08/25/2018 1120   CALCIUM 9.6 11/02/2013 1341   PROT 7.1 08/25/2018 1120   PROT 7.7 11/02/2013 1341   ALBUMIN 4.4 08/25/2018 1120   ALBUMIN 3.8 11/02/2013 1341   AST 61 (H) 08/25/2018 1120   AST 63 (H) 11/02/2013 1341   ALT 55 (H) 08/25/2018 1120   ALT 71 (H) 11/02/2013 1341   ALKPHOS 101 08/25/2018 1120   ALKPHOS 146 (H) 11/02/2013 1341   BILITOT 0.5 08/25/2018 1120   BILITOT 0.3 11/02/2013 1341   GFRNONAA 95 08/25/2018 1120   GFRNONAA >60 11/02/2013 1341   GFRAA 109 08/25/2018 1120   GFRAA >60 11/02/2013 1341   Lab Results  Component Value Date   WBC 6.7 08/25/2018   HGB 12.9 08/25/2018   HCT 40.3 08/25/2018   MCV 82 08/25/2018   PLT 239 08/25/2018    Imaging Studies: No results found.  Assessment and Plan:   Morgan [name] is a 53 y.o. y/o female here to follow-up for fatty liver, liver lesion and abdominal discomfort.  Not immune to hepatitis A and B.  H. pylori breath test negative.  In March 2020 EGD demonstrated features suggestive of intestinal metaplasia.  Her main issue to follow up today is abdominal pain and diarrhea.  She had been on Metformin and the diarrhea has stopped after she D/c the metformin   Plan 1. The MRI demonstrates that the liver lesion is a hemangioma which is less than 5 cm in size. As per recent recommendations no further evaluation is needed but I would probably say that we repeat the MRI in about a year's time that is in February 2021 to make sure that there is no change in the size of the lesion as there has been a slight increase in size compared to prior evaluation.  2. Fatty liver disease: Limit alcohol consumption and needs hepatitis a and B vaccine.  3.   Possible intestinal metaplasia of the stomach.  Repeat EGD in 11/05/2018 for gastric mapping.   I discussed the assessment and treatment plan with the patient. The patient was provided an opportunity to ask questions and all were answered. The patient agreed with the plan and demonstrated an understanding of the instructions.   The patient was advised to call back or seek an in-person evaluation if the symptoms worsen or if the condition fails to improve as anticipated.  I provided 11 minutes of non-face-to-face time during this encounter.  Dr Morgan Bellows MD,MRCP [facility]) Gastroenterology/Hepatology Pager: [phone]   Speech recognition software was used to dictate this note.

## 2018-09-17 ENCOUNTER — Other Ambulatory Visit: Payer: Self-pay

## 2018-09-17 ENCOUNTER — Emergency Department (HOSPITAL_COMMUNITY): Payer: Federal, State, Local not specified - PPO

## 2018-09-17 ENCOUNTER — Encounter (HOSPITAL_COMMUNITY): Payer: Self-pay | Admitting: Emergency Medicine

## 2018-09-17 ENCOUNTER — Emergency Department (HOSPITAL_COMMUNITY)
Admission: EM | Admit: 2018-09-17 | Discharge: 2018-09-17 | Disposition: A | Discharge status: Home / Self Care | Payer: Federal, State, Local not specified - PPO | Attending: Emergency Medicine | Admitting: Emergency Medicine

## 2018-09-17 DIAGNOSIS — I10 Essential (primary) hypertension: Secondary | ICD-10-CM | POA: Diagnosis not present

## 2018-09-17 DIAGNOSIS — E782 Mixed hyperlipidemia: Secondary | ICD-10-CM | POA: Diagnosis not present

## 2018-09-17 DIAGNOSIS — Z7984 Long term (current) use of oral hypoglycemic drugs: Secondary | ICD-10-CM | POA: Diagnosis not present

## 2018-09-17 DIAGNOSIS — R1032 Left lower quadrant pain: Secondary | ICD-10-CM | POA: Diagnosis present

## 2018-09-17 DIAGNOSIS — Z79899 Other long term (current) drug therapy: Secondary | ICD-10-CM | POA: Diagnosis not present

## 2018-09-17 DIAGNOSIS — E119 Type 2 diabetes mellitus without complications: Secondary | ICD-10-CM | POA: Diagnosis not present

## 2018-09-17 DIAGNOSIS — R109 Unspecified abdominal pain: Secondary | ICD-10-CM

## 2018-09-17 LAB — COMPREHENSIVE METABOLIC PANEL
ALT: 46 U/L — ABNORMAL HIGH (ref 0–44)
AST: 45 U/L — ABNORMAL HIGH (ref 15–41)
Albumin: 3.6 g/dL (ref 3.5–5.0)
Alkaline Phosphatase: 95 U/L (ref 38–126)
Anion gap: 8 (ref 5–15)
BUN: 10 mg/dL (ref 6–20)
CO2: 26 mmol/L (ref 22–32)
Calcium: 10.4 mg/dL — ABNORMAL HIGH (ref 8.9–10.3)
Chloride: 103 mmol/L (ref 98–111)
Creatinine, Ser: 0.81 mg/dL (ref 0.44–1.00)
GFR calc Af Amer: >60 mL/min (ref >60)
GFR calc non Af Amer: >60 mL/min (ref >60)
Glucose, Bld: 210 mg/dL — ABNORMAL HIGH (ref 70–99)
Potassium: 3.9 mmol/L (ref 3.5–5.1)
Sodium: 137 mmol/L (ref 135–145)
Total Bilirubin: 0.7 mg/dL (ref 0.3–1.2)
Total Protein: 6.9 g/dL (ref 6.5–8.1)

## 2018-09-17 LAB — URINALYSIS, ROUTINE W REFLEX MICROSCOPIC
Bilirubin Urine: NEGATIVE
Glucose, UA: NEGATIVE mg/dL
Hgb urine dipstick: NEGATIVE
Ketones, ur: NEGATIVE mg/dL
Leukocytes,Ua: NEGATIVE
Nitrite: NEGATIVE
Protein, ur: NEGATIVE mg/dL
Specific Gravity, Urine: 1.015 (ref 1.005–1.030)
pH: 5 (ref 5.0–8.0)

## 2018-09-17 LAB — CBC
HCT: 39.7 % (ref 36.0–46.0)
Hemoglobin: 12.7 g/dL (ref 12.0–15.0)
MCH: 26.7 pg (ref 26.0–34.0)
MCHC: 32 g/dL (ref 30.0–36.0)
MCV: 83.4 fL (ref 80.0–100.0)
Platelets: 205 10*3/uL (ref 150–400)
RBC: 4.76 MIL/uL (ref 3.87–5.11)
RDW: 13.5 % (ref 11.5–15.5)
WBC: 6.9 10*3/uL (ref 4.0–10.5)
nRBC: 0 % (ref 0.0–0.2)

## 2018-09-17 LAB — LIPASE, BLOOD: Lipase: 23 U/L (ref 11–51)

## 2018-09-17 LAB — I-STAT BETA HCG BLOOD, ED (MC, WL, AP ONLY): I-stat hCG, quantitative: <5 m[IU]/mL (ref <5)

## 2018-09-17 MED ORDER — METHOCARBAMOL 500 MG PO TABS: 500.0000 mg | ORAL_TABLET | Freq: Two times a day (BID) | ORAL | 0 refills | Status: DC

## 2018-09-17 MED ORDER — SODIUM CHLORIDE 0.9% FLUSH: 3.0000 mL | Freq: Once | INTRAVENOUS | Status: DC

## 2018-09-17 MED ORDER — HYDROCODONE-ACETAMINOPHEN 5-325 MG PO TABS
1.0000 | ORAL_TABLET | Freq: Once | ORAL | Status: AC
  Administered 2018-09-17: 22:00:00 1 via ORAL
  Filled 2018-09-17: qty 1

## 2018-09-17 MED ORDER — KETOROLAC TROMETHAMINE 30 MG/ML IJ SOLN
15.0000 mg | Freq: Once | INTRAMUSCULAR | Status: AC
  Administered 2018-09-17: 20:00:00 15 mg via INTRAVENOUS
  Filled 2018-09-17: qty 1

## 2018-09-17 MED ORDER — METHOCARBAMOL 500 MG PO TABS
1000.0000 mg | ORAL_TABLET | Freq: Once | ORAL | Status: AC
  Administered 2018-09-17: 1000 mg via ORAL
  Filled 2018-09-17: qty 2

## 2018-09-17 MED ORDER — LIDOCAINE 5 % EX PTCH: 1.0000 | MEDICATED_PATCH | CUTANEOUS | 0 refills | Status: DC

## 2018-09-17 MED ORDER — HYDROCODONE-ACETAMINOPHEN 5-325 MG PO TABS: 2.0000 | ORAL_TABLET | Freq: Once | ORAL | Status: DC

## 2018-09-17 MED ORDER — SODIUM CHLORIDE 0.9 % IV BOLUS
1000.0000 mL | Freq: Once | INTRAVENOUS | Status: AC
  Administered 2018-09-17: 1000 mL via INTRAVENOUS

## 2018-09-17 MED ORDER — DICLOFENAC SODIUM 1 % TD GEL: 4.0000 g | Freq: Four times a day (QID) | TRANSDERMAL | 0 refills | Status: DC

## 2018-09-17 MED ORDER — HYDROCODONE-ACETAMINOPHEN 5-325 MG PO TABS: 1.0000 | ORAL_TABLET | Freq: Four times a day (QID) | ORAL | 0 refills | Status: DC | PRN

## 2018-09-17 MED ORDER — ONDANSETRON HCL 4 MG/2ML IJ SOLN
4.0000 mg | Freq: Once | INTRAMUSCULAR | Status: AC
  Administered 2018-09-17: 4 mg via INTRAVENOUS
  Filled 2018-09-17: qty 2

## 2018-09-17 NOTE — Discharge Instructions (Addendum)
Take it easy, but do not lay around too much as this may make any stiffness worse.  Antiinflammatory medications: Take 600 mg of ibuprofen every 6 hours or 440 mg (over the counter dose) to 500 mg (prescription dose) of naproxen every 12 hours for the next 3 days. After this time, these medications may be used as needed for pain. Take these medications with food to avoid upset stomach. Choose only one of these medications, do not take them together. Acetaminophen (generic for Tylenol): Should you continue to have additional pain while taking the ibuprofen or naproxen, you may add in acetaminophen as needed. Your daily total maximum amount of acetaminophen from all sources should be limited to 4000mg /day for persons without liver problems, or 2000mg /day for those with liver problems. Vicodin: May take Vicodin (hydrocodone-acetaminophen) as needed for severe pain.   Do not drive or perform other dangerous activities while taking this medication as it can cause drowsiness as well as changes in reaction time and judgement.   Please note that each pill of Vicodin contains 325 mg of acetaminophen (generic for Tylenol) and the above dosage limits apply. Methocarbamol: Methocarbamol (generic for Robaxin) is a muscle relaxer and can help relieve stiff muscles or muscle spasms.  Do not drive or perform other dangerous activities while taking this medication as it can cause drowsiness as well as changes in reaction time and judgement. Lidocaine patches: These are available via either prescription or over-the-counter. The over-the-counter option may be more economical one and are likely just as effective. There are multiple over-the-counter brands, such as Salonpas. Exercises: Be sure to perform the attached exercises starting with three times a week and working up to performing them daily. This is an essential part of preventing long term problems.  Follow up: Follow up with a primary care provider for any future  management of these complaints. Be sure to follow up within 7-10 days.  There is still a chance this pain is being caused by passing kidney stones.  Follow-up with urology may be necessary. Return: Return to the ED should symptoms worsen.  For prescription assistance, may try using prescription discount sites or apps, such as goodrx.com

## 2018-09-17 NOTE — ED Notes (Signed)
Patient transported to CT 

## 2018-09-17 NOTE — ED Provider Notes (Signed)
[facility] EMERGENCY DEPARTMENT Provider Note   CSN: [id number] Arrival date & time: 09/17/18  1814    History   Chief Complaint Chief Complaint  Patient presents with   Abdominal Pain    HPI Morgan [name] is a 53 y.o. female.     HPI   Morgan [name] is a 53 y.o. female, with a history of bipolar, DM, endometriosis, HTN, hypercholesterolemia, GERD, presenting to the ED with left flank pain beginning two days ago. Pain is sharp, waxing and waning, currently 8/10, radiating to the left lower back. Accompanied by nausea. Last BM was this morning and was normal.  Denies fever/chills, vomiting, diarrhea, urinary symptoms, chest pain, shortness of breath, hematochezia/melena, falls/trauma, changes in bowel or bladder function, saddle anesthesias, or any other complaints.       Past Medical History:  Diagnosis Date   Anxiety    Bipolar disorder ([place])    Breast mass    right   Cervical dysplasia    Diabetes mellitus without complication ([place])    Endometriosis    GERD (gastroesophageal reflux disease)    Hypercholesteremia    Hypertension    Migraine    Pelvic kidney    left   Postpartum depression    hx of    Patient Active Problem List   Diagnosis Date Noted   Chest pain 06/14/2017   Hypertension complicating diabetes ([place]) 06/14/2017   Type 2 diabetes mellitus with hyperlipidemia ([place]) 06/14/2017   Hyperlipidemia associated with type 2 diabetes mellitus ([place]) 06/14/2017   Cervical dysplasia    Bipolar disorder ([place])     Past Surgical History:  Procedure Laterality Date   BACK SURGERY  2014   BREAST LUMPECTOMY WITH RADIOACTIVE SEED LOCALIZATION Right 10/28/2014   Procedure: RIGHT BREAST LUMPECTOMY WITH RADIOACTIVE SEED LOCALIZATION;  Surgeon: [name], MD;  Location: [facility];  Service: General;  Laterality: Right;   CESAREAN SECTION  03   COLONOSCOPY WITH PROPOFOL N/A 05/01/2018   Procedure:  COLONOSCOPY WITH PROPOFOL;  Surgeon: [name], MD;  Location: [facility] ENDOSCOPY;  Service: Gastroenterology;  Laterality: N/A;   COLPOSCOPY     ESOPHAGOGASTRODUODENOSCOPY (EGD) WITH PROPOFOL N/A 05/01/2018   Procedure: ESOPHAGOGASTRODUODENOSCOPY (EGD) WITH PROPOFOL;  Surgeon: [name], MD;  Location: [facility] ENDOSCOPY;  Service: Gastroenterology;  Laterality: N/A;   GYNECOLOGIC CRYOSURGERY     NECK SURGERY  2012   PELVIC LAPAROSCOPY  63     OB History    Gravida  1   Para  1   Term  1   Preterm      AB      Living  1     SAB      TAB      Ectopic      Multiple      Live Births               Home Medications    Prior to Admission medications   Medication Sig Start Date End Date Taking? Authorizing Provider  ARIPiprazole (ABILIFY) 15 MG tablet Take 1 tablet by mouth at bedtime. 12/21/17   Noemi Chapel, NP  atorvastatin (LIPITOR) 40 MG tablet Take 1 tablet (40 mg total) by mouth daily. 08/28/18   Scot Jun, FNP  diclofenac sodium (VOLTAREN) 1 % GEL Apply 4 g topically 4 (four) times daily. 09/17/18   Viktor Philipp C, PA-C  DULoxetine (CYMBALTA) 60 MG capsule Take 60 mg by mouth at bedtime. 11/04/16  [provider]  HYDROcodone-acetaminophen (NORCO/VICODIN) 5-325 MG tablet Take 1-2 tablets by mouth every 6 (six) hours as needed for severe pain. 09/17/18   Tonnette Zwiebel C, PA-C  lidocaine (LIDODERM) 5 % Place 1 patch onto the skin daily. Remove & Discard patch within 12 hours or as directed by MD 09/17/18   Duru Reiger C, PA-C  losartan (COZAAR) 100 MG tablet Take 1 tablet daily by mouth. 08/24/18   Scot Jun, FNP  methocarbamol (ROBAXIN) 500 MG tablet Take 1 tablet (500 mg total) by mouth 2 (two) times daily. 09/17/18   Zamzam Whinery C, PA-C  omeprazole (PRILOSEC) 20 MG capsule Take 20 mg by mouth at bedtime.     [provider]  sitaGLIPtin (JANUVIA) 100 MG tablet Take 1 tablet (100 mg total) by mouth daily. 08/28/18   Scot Jun, FNP     Family History Family History  Problem Relation Age of Onset   Diabetes Mother    Breast cancer Mother 35   Thyroid disease Mother    Depression Mother    Hypertension Father    Heart disease Father    Heart attack Father 38       Two instances, first at age 60   Stroke Father    Alcohol abuse Father    Throat cancer Father    Diabetes Maternal Grandmother    Depression Maternal Grandmother    Uterine cancer Maternal Grandmother     Social History Social History   Tobacco Use   Smoking status: Never Smoker   Smokeless tobacco: Never Used  Substance Use Topics   Alcohol use: No   Drug use: No     Allergies   Lamotrigine   Review of Systems Review of Systems  Constitutional: Negative for chills and fever.  Respiratory: Negative for shortness of breath.   Cardiovascular: Negative for chest pain.  Gastrointestinal: Positive for nausea. Negative for abdominal pain, blood in stool, constipation, diarrhea and vomiting.  Genitourinary: Positive for flank pain. Negative for difficulty urinating, dysuria, frequency, hematuria, vaginal bleeding and vaginal discharge.  Musculoskeletal: Positive for back pain.  All other systems reviewed and are negative.    Physical Exam Updated Vital Signs BP 135/82 (BP Location: Right Arm)    Pulse (!) 111    Temp 98.8 F (37.1 C) (Oral)    Resp 18    Ht 5\' 1"  (1.549 m)    Wt 85 kg    LMP 05/12/2011 Comment: no period in 7 years    SpO2 97%    BMI 35.41 kg/m   Physical Exam Vitals signs and nursing note reviewed.  Constitutional:      General: She is not in acute distress.    Appearance: She is well-developed. She is not diaphoretic.  HENT:     Head: Normocephalic and atraumatic.     Mouth/Throat:     Mouth: Mucous membranes are moist.     Pharynx: Oropharynx is clear.  Eyes:     Conjunctiva/sclera: Conjunctivae normal.  Neck:     Musculoskeletal: Neck supple.  Cardiovascular:     Rate and Rhythm:  Normal rate and regular rhythm.     Pulses: Normal pulses.          Radial pulses are 2+ on the right side and 2+ on the left side.       Posterior tibial pulses are 2+ on the right side and 2+ on the left side.     Heart sounds: Normal heart sounds.  Comments: Tactile temperature in the extremities appropriate and equal bilaterally. Pulmonary:     Effort: Pulmonary effort is normal. No respiratory distress.     Breath sounds: Normal breath sounds.  Abdominal:     Palpations: Abdomen is soft.     Tenderness: There is abdominal tenderness. There is left CVA tenderness. There is no guarding.       Comments: Tenderness to left flank  Musculoskeletal:       Back:     Right lower leg: No edema.     Left lower leg: No edema.     Comments: Normal motor function intact in all extremities. No midline spinal tenderness.   Lymphadenopathy:     Cervical: No cervical adenopathy.  Skin:    General: Skin is warm and dry.  Neurological:     Mental Status: She is alert.     Comments: Sensation grossly intact to light touch in the lower extremities bilaterally. No saddle anesthesias. Strength 5/5 in the bilateral lower extremities. No noted gait deficit. Coordination intact with heel to shin testing.  Psychiatric:        Mood and Affect: Mood and affect normal.        Speech: Speech normal.        Behavior: Behavior normal.      ED Treatments / Results  Labs (all labs ordered are listed, but only abnormal results are displayed) Labs Reviewed  COMPREHENSIVE METABOLIC PANEL - Abnormal; Notable for the following components:      Result Value   Glucose, Bld 210 (*)    Calcium 10.4 (*)    AST 45 (*)    ALT 46 (*)    All other components within normal limits  URINALYSIS, ROUTINE W REFLEX MICROSCOPIC - Abnormal; Notable for the following components:   Color, Urine AMBER (*)    APPearance CLOUDY (*)    All other components within normal limits  LIPASE, BLOOD  CBC  I-STAT BETA HCG  BLOOD, ED ([facility], [facility], [facility] ONLY)    EKG None  Radiology Ct Renal Stone Study  Result Date: 09/17/2018 CLINICAL DATA:  Left-sided abdominal pain EXAM: CT ABDOMEN AND PELVIS WITHOUT CONTRAST TECHNIQUE: Multidetector CT imaging of the abdomen and pelvis was performed following the standard protocol without IV contrast. COMPARISON:  03/15/2018 FINDINGS: Lower chest: No acute abnormality. Hepatobiliary: Fatty infiltration of the liver is noted. There is a vague area of increased attenuation identified in the lateral aspect of the right lobe of the liver which corresponds to lesion seen on prior exams. Again this likely represents a small hemangioma. Gallbladder is decompressed. Pancreas: Unremarkable. No pancreatic ductal dilatation or surrounding inflammatory changes. Spleen: Normal in size without focal abnormality. Adrenals/Urinary Tract: Adrenal glands are within normal limits. Kidneys are well visualized bilaterally. The left kidney is pelvic in nature with a few tiny nonobstructing stones identified. Stable nonobstructing stones are noted within the right kidney. Ureters are within normal limits. The bladder is decompressed. Stomach/Bowel: The appendix is not well visualized although no inflammatory changes are seen to suggest appendicitis. Colon and small bowel show no obstructive or inflammatory change. Vascular/Lymphatic: No significant vascular findings are present. No enlarged abdominal or pelvic lymph nodes. Reproductive: Uterus and bilateral adnexa are unremarkable. Other: No abdominal wall hernia or abnormality. No abdominopelvic ascites. Musculoskeletal: Postsurgical changes are noted at L4-5. Sclerotic focus is noted at the superior endplate of L2 stable from the previous exam. No acute bony abnormality is noted. IMPRESSION: Stable lesion within the right lobe  of the liver which has been previously documented as likely hemangioma. Underlying fatty infiltration of the liver is noted. Bilateral renal  calculi without obstructive change. Left pelvic kidney is again seen. No acute abnormality noted. Electronically Signed   By: Inez Catalina M.D.   On: 09/17/2018 20:01    Procedures Procedures (including critical care time)  Medications Ordered in ED Medications  sodium chloride 0.9 % bolus 1,000 mL (0 mLs Intravenous Stopped 09/17/18 2302)  ketorolac (TORADOL) 30 MG/ML injection 15 mg (15 mg Intravenous Given 09/17/18 2020)  ondansetron (ZOFRAN) injection 4 mg (4 mg Intravenous Given 09/17/18 2019)  HYDROcodone-acetaminophen (NORCO/VICODIN) 5-325 MG per tablet 1 tablet (1 tablet Oral Given 09/17/18 2139)  methocarbamol (ROBAXIN) tablet 1,000 mg (1,000 mg Oral Given 09/17/18 2139)     Initial Impression / Assessment and Plan / ED Course  I have reviewed the triage vital signs and the nursing notes.  Pertinent labs & imaging results that were available during my care of the patient were reviewed by me and considered in my medical decision making (see chart for details).        Patient presents with left flank and back pain for the last 2 days.  Pain description and location seemed to be consistent with renal colic versus muscular origin. Patient is nontoxic appearing, afebrile, not tachycardic, not tachypneic, not hypotensive, maintains excellent SPO2 on room air, and is in no apparent distress.  No leukocytosis.   No acute abnormality noted on CT renal stone study. No focal neurologic deficits. Patient showed improvement during ED course.  PCP follow-up. The patient was given instructions for home care as well as return precautions. Patient voices understanding of these instructions, accepts the plan, and is comfortable with discharge.   Vitals:   09/17/18 2200 09/17/18 2215 09/17/18 2230 09/17/18 2243  BP: 108/62 109/70 (!) 112/53 116/68  Pulse: 76 81 77 90  Resp:    18  Temp:      TempSrc:      SpO2: 96% 95% 98% 100%  Weight:      Height:         Final Clinical Impressions(s) / ED  Diagnoses   Final diagnoses:  Lt flank pain    ED Discharge Orders         Ordered    HYDROcodone-acetaminophen (NORCO/VICODIN) 5-325 MG tablet  Every 6 hours PRN     09/17/18 2235    methocarbamol (ROBAXIN) 500 MG tablet  2 times daily     09/17/18 2235    diclofenac sodium (VOLTAREN) 1 % GEL  4 times daily     09/17/18 2235    lidocaine (LIDODERM) 5 %  Every 24 hours     09/17/18 [place], Dennise Raabe C, PA-C 09/19/18 0158    Fredia Sorrow, MD 09/20/18 [phone]

## 2018-09-17 NOTE — ED Notes (Signed)
Urine culture attached to sample  °

## 2018-09-17 NOTE — ED Notes (Signed)
Pt discharged from ED; instructions provided and scripts given; Pt encouraged to return to ED if symptoms worsen and to f/u with PCP; Pt verbalized understanding of all instructions 

## 2018-09-17 NOTE — ED Triage Notes (Signed)
Onset 2 days ago developed LLQ pain radiating to back with nausea.  Denies dysuria, vomiting, or diarrhea.

## 2018-09-18 ENCOUNTER — Ambulatory Visit: Payer: Federal, State, Local not specified - PPO

## 2018-09-22 ENCOUNTER — Encounter: Payer: Self-pay | Admitting: Family Medicine

## 2018-09-25 ENCOUNTER — Other Ambulatory Visit: Payer: Self-pay

## 2018-09-25 ENCOUNTER — Ambulatory Visit (INDEPENDENT_AMBULATORY_CARE_PROVIDER_SITE_OTHER): Payer: Federal, State, Local not specified - PPO | Admitting: Family Medicine

## 2018-09-25 DIAGNOSIS — K2930 Chronic superficial gastritis without bleeding: Secondary | ICD-10-CM | POA: Diagnosis not present

## 2018-09-25 DIAGNOSIS — R109 Unspecified abdominal pain: Secondary | ICD-10-CM

## 2018-09-25 DIAGNOSIS — D1803 Hemangioma of intra-abdominal structures: Secondary | ICD-10-CM

## 2018-09-25 DIAGNOSIS — R10A2 Flank pain, left side: Secondary | ICD-10-CM

## 2018-09-25 DIAGNOSIS — Z09 Encounter for follow-up examination after completed treatment for conditions other than malignant neoplasm: Secondary | ICD-10-CM

## 2018-09-25 DIAGNOSIS — K760 Fatty (change of) liver, not elsewhere classified: Secondary | ICD-10-CM

## 2018-09-25 DIAGNOSIS — R1012 Left upper quadrant pain: Secondary | ICD-10-CM | POA: Diagnosis not present

## 2018-09-25 DIAGNOSIS — N200 Calculus of kidney: Secondary | ICD-10-CM | POA: Diagnosis not present

## 2018-09-25 NOTE — Progress Notes (Signed)
Virtual Visit via Telephone Note  I connected with Morgan [name] on 09/25/18 at  3:10 PM EDT by telephone and verified that I am speaking with the correct person using two identifiers.   I discussed the limitations, risks, security and privacy concerns of performing an evaluation and management service by telephone and the availability of in person appointments. I also discussed with the patient that there may be a patient responsible charge related to this service. The patient expressed understanding and agreed to proceed.  Patient Location: Home Provider Location: Office at [place] Others participating in call: call initiated by Allyne Gee, CMA and who then transferred the call   History of Present Illness:      53 yo female who reports that since January she has been having pain in various parts of her  abdomen but mostly in the left upper abdomen which radiates to her flank and back. She reports that the pain comes and goes and is sharp. There is no standard amount of time that the pain lasts. She cannot think to anything that makes the pain better or worse. No relation of pain to eating or movement. She did see a GI doctor and has had a colonoscopy but she states that nothing has been found to explain the pain that she is having and she would like to see a different GI doctor, Dr. Doris Cheadle.        She reports that she is status post recent emergency department visit on 09/17/2018 due to left upper quadrant pain that radiates to the left flank.  She reports that she did have some relief of pain with medication given in the emergency department including Vicodin as well as the Lidoderm patches.  She does have a past history of kidney stones but states that she was told that the CT done in the emergency department did not show any current obstructing kidney stones.  She denies any dysuria or hematuria.  No fever or chills.  No nausea or vomiting.       She reports that the diarrhea that she  was having earlier in the year has resolved after she stopped the use of metformin for treatment of her diabetes.  Patient is now on Januvia and she feels that her blood sugars are reasonably controlled.  She denies increased thirst or urinary frequency.  She continues to take the omeprazole that was prescribed for treatment of gastritis.  She has not had any issues with diarrhea due to use of PPI therapy.         Past Medical History:  Diagnosis Date  . Anxiety   . Bipolar disorder ([place])   . Breast mass    right  . Cervical dysplasia   . Diabetes mellitus without complication ([place])   . Endometriosis   . GERD (gastroesophageal reflux disease)   . Hypercholesteremia   . Hypertension   . Migraine   . Pelvic kidney    left  . Postpartum depression    hx of    Past Surgical History:  Procedure Laterality Date  . BACK SURGERY  2014  . BREAST LUMPECTOMY WITH RADIOACTIVE SEED LOCALIZATION Right 10/28/2014   Procedure: RIGHT BREAST LUMPECTOMY WITH RADIOACTIVE SEED LOCALIZATION;  Surgeon: [name], MD;  Location: [facility];  Service: General;  Laterality: Right;  . CESAREAN SECTION  03  . COLONOSCOPY WITH PROPOFOL N/A 05/01/2018   Procedure: COLONOSCOPY WITH PROPOFOL;  Surgeon: [name], MD;  Location: [facility];  Service: Gastroenterology;  Laterality: N/A;  . COLPOSCOPY    . ESOPHAGOGASTRODUODENOSCOPY (EGD) WITH PROPOFOL N/A 05/01/2018   Procedure: ESOPHAGOGASTRODUODENOSCOPY (EGD) WITH PROPOFOL;  Surgeon: [name], MD;  Location: [facility] ENDOSCOPY;  Service: Gastroenterology;  Laterality: N/A;  . GYNECOLOGIC CRYOSURGERY    . NECK SURGERY  2012  . PELVIC LAPAROSCOPY  70    Family History  Problem Relation Age of Onset  . Diabetes Mother   . Breast cancer Mother 84  . Thyroid disease Mother   . Depression Mother   . Hypertension Father   . Heart disease Father   . Heart attack Father 64       Two instances, first at age 47  . Stroke Father   . Alcohol  abuse Father   . Throat cancer Father   . Diabetes Maternal Grandmother   . Depression Maternal Grandmother   . Uterine cancer Maternal Grandmother     Social History   Tobacco Use  . Smoking status: Never Smoker  . Smokeless tobacco: Never Used  Substance Use Topics  . Alcohol use: No  . Drug use: No     Allergies  Allergen Reactions  . Lamotrigine Rash       Observations/Objective: No vital signs or physical exam conducted as visit was done via telephone  Assessment and Plan: 1. Abdominal pain, left upper quadrant; 7.  Gastritis Patient with complaint of left upper quadrant pain which radiates to the left flank/left back which is been occurring since January.  She reports that she has been seen by a gastroenterologist and had EGD/colonoscopy  and is scheduled for an upcoming repeat colonoscopy and EGD however she would like to see a different GI doctor and would like a referral.  On review of chart, patient did have EGD and colonoscopy earlier this year which showed gastritis of the antrum of the stomach as well as possible intestinal metaplasia.  Colonoscopy revealed removal of 2 sessile polyps.  Patient was told to stop the use of nonsteroidal anti-inflammatories and was also placed on Prilosec.  She requests referral to a different gastroenterologist and new referral will be placed.  In addition, she should continue to avoid nonsteroidal anti-inflammatories and continue use of PPI therapy for gastritis seen on EGD. - Ambulatory referral to Gastroenterology  2. Left flank pain; 3. Encounter for examination following treatment at hospital; 4.  Renal calculi,  bilateral Discussed with patient that since she got some relief of her left flank pain with use of lidocaine patch that her left flank/back pain may be musculoskeletal.  Her recent emergency department visit on 09/17/2018 also indicates that her flank pain is likely musculoskeletal.  She did have a repeat CT scan due to her  history of kidney stones.  Her CT scan in January showed a single nonobstructing renal calculus measuring 3 mm in the right kidney and a 3 mm nonobstructing stone measuring 3 mm in the left kidney and left kidney with a pelvic location.  CT scan done on 09/17/2018 showed bilateral tiny nonobstructing stones.  Patient states that she has seen urology in follow-up.  5. Fatty liver disease, nonalcoholic; 6.  Liver hemangioma Patient had January CT scan which showed an abnormality in the liver and then had subsequent MRI which identified the abnormality as a hemangioma.  Patient is aware that she will need repeat imaging in about a year to make sure that the liver lesion is stable.  She also was noted to have fatty steatosis on CT scan.  She has been made aware of this finding as well as the need to lose weight, avoid alcohol by GI.  Patient had CMP in the emergency department which showed AST of 45 and ALT of 46.  Follow Up Instructions:Return for Diabetes-please schedule follow-up.    I discussed the assessment and treatment plan with the patient. The patient was provided an opportunity to ask questions and all were answered. The patient agreed with the plan and demonstrated an understanding of the instructions.   The patient was advised to call back or seek an in-person evaluation if the symptoms worsen or if the condition fails to improve as anticipated.  I provided 14 minutes of non-face-to-face time during this encounter. An additional 20 minutes was spent on review of chart both before and after patient's visit including labs, imaging and visits with gastroenterology and ED visits.  Antony Blackbird, MD

## 2018-09-26 ENCOUNTER — Encounter: Payer: Self-pay | Admitting: Family Medicine

## 2018-10-13 ENCOUNTER — Other Ambulatory Visit: Payer: Self-pay

## 2018-10-13 ENCOUNTER — Encounter (HOSPITAL_COMMUNITY): Payer: Self-pay | Admitting: Emergency Medicine

## 2018-10-13 ENCOUNTER — Emergency Department (HOSPITAL_COMMUNITY)
Admission: EM | Admit: 2018-10-13 | Discharge: 2018-10-13 | Disposition: A | Discharge status: Home / Self Care | Payer: Federal, State, Local not specified - PPO | Attending: Emergency Medicine | Admitting: Emergency Medicine

## 2018-10-13 DIAGNOSIS — N9089 Other specified noninflammatory disorders of vulva and perineum: Secondary | ICD-10-CM | POA: Diagnosis present

## 2018-10-13 DIAGNOSIS — N764 Abscess of vulva: Secondary | ICD-10-CM | POA: Diagnosis not present

## 2018-10-13 DIAGNOSIS — Z79899 Other long term (current) drug therapy: Secondary | ICD-10-CM | POA: Diagnosis not present

## 2018-10-13 DIAGNOSIS — Z7984 Long term (current) use of oral hypoglycemic drugs: Secondary | ICD-10-CM | POA: Insufficient documentation

## 2018-10-13 DIAGNOSIS — E119 Type 2 diabetes mellitus without complications: Secondary | ICD-10-CM | POA: Insufficient documentation

## 2018-10-13 DIAGNOSIS — I10 Essential (primary) hypertension: Secondary | ICD-10-CM | POA: Insufficient documentation

## 2018-10-13 MED ORDER — DOXYCYCLINE HYCLATE 100 MG PO TABS
100.0000 mg | ORAL_TABLET | Freq: Once | ORAL | Status: AC
  Administered 2018-10-13: 06:00:00 100 mg via ORAL
  Filled 2018-10-13: qty 1

## 2018-10-13 MED ORDER — DOXYCYCLINE HYCLATE 100 MG PO CAPS: 100.0000 mg | ORAL_CAPSULE | Freq: Two times a day (BID) | ORAL | 0 refills | Status: DC

## 2018-10-13 NOTE — Discharge Instructions (Addendum)
1. Medications: doxycycline, usual home medications 2. Treatment: rest, drink plenty of fluids, use warm soaks or compresses,  3. Follow Up: Please followup with your primary doctor or OB/GYN in 2-3 days for discussion of your diagnoses and further evaluation after today's visit;  Please return to the ER for fevers, chills, nausea, vomiting or other signs of worsening infection

## 2018-10-13 NOTE — ED Notes (Signed)
Patient verbalizes understanding of discharge instructions. Opportunity for questioning and answers were provided. Armband removed by staff, pt discharged from ED ambulatory.   

## 2018-10-13 NOTE — ED Triage Notes (Signed)
Pt reports a painful "sore" in her vaginal area that she noticed yesterday. States that it started bleeding heavily tonight but she was able to get it stopped. Pt unable to describe size of lesion.

## 2018-10-13 NOTE — ED Provider Notes (Signed)
[facility] EMERGENCY DEPARTMENT Provider Note   CSN: [id number] Arrival date & time: 10/13/18  0107     History   Chief Complaint Chief Complaint  Patient presents with  . Sore    HPI Morgan [name] is a 53 y.o. female with a hx of anxiety, bipolar disorder, diabetes on Januvia, hypertension, high cholesterol, GERD presents to the Emergency Department complaining of gradual, persistent, pain and swelling to the right labia with acute onset bleeding just prior to arrival.  Patient reports she wiped after urinating and the site ruptured open and was bleeding.  She is unsure whether or not there was purulent drainage as well.  She denies a history of Bartholin gland cyst or recurrent abscess.  She reports she is sexually active with one female for the last 20 years in a monogamous relationship.  She is not concerned about STD.  She denies vaginal discharge or vaginal bleeding.  She denies a history of syphilis or herpes.  She denies fevers, chills, abdominal pain, nausea, vomiting, weakness, dysuria, hematuria.  No specific alleviating symptoms.  No treatments prior to arrival.  Patient denies recently elevated blood sugars.     The history is provided by the patient and medical records. No language interpreter was used.    Past Medical History:  Diagnosis Date  . Anxiety   . Bipolar disorder ([place])   . Breast mass    right  . Cervical dysplasia   . Diabetes mellitus without complication ([place])   . Endometriosis   . GERD (gastroesophageal reflux disease)   . Hypercholesteremia   . Hypertension   . Migraine   . Pelvic kidney    left  . Postpartum depression    hx of    Patient Active Problem List   Diagnosis Date Noted  . Chest pain 06/14/2017  . Hypertension complicating diabetes ([place]) 06/14/2017  . Type 2 diabetes mellitus with hyperlipidemia ([place]) 06/14/2017  . Hyperlipidemia associated with type 2 diabetes mellitus ([place]) 06/14/2017  . Cervical  dysplasia   . Bipolar disorder [facility])     Past Surgical History:  Procedure Laterality Date  . BACK SURGERY  2014  . BREAST LUMPECTOMY WITH RADIOACTIVE SEED LOCALIZATION Right 10/28/2014   Procedure: RIGHT BREAST LUMPECTOMY WITH RADIOACTIVE SEED LOCALIZATION;  Surgeon: [name], MD;  Location: [facility];  Service: General;  Laterality: Right;  . CESAREAN SECTION  03  . COLONOSCOPY WITH PROPOFOL N/A 05/01/2018   Procedure: COLONOSCOPY WITH PROPOFOL;  Surgeon: [name], MD;  Location: [facility] ENDOSCOPY;  Service: Gastroenterology;  Laterality: N/A;  . COLPOSCOPY    . ESOPHAGOGASTRODUODENOSCOPY (EGD) WITH PROPOFOL N/A 05/01/2018   Procedure: ESOPHAGOGASTRODUODENOSCOPY (EGD) WITH PROPOFOL;  Surgeon: [name], MD;  Location: [facility] ENDOSCOPY;  Service: Gastroenterology;  Laterality: N/A;  . GYNECOLOGIC CRYOSURGERY    . NECK SURGERY  2012  . PELVIC LAPAROSCOPY  28     OB History    Gravida  1   Para  1   Term  1   Preterm      AB      Living  1     SAB      TAB      Ectopic      Multiple      Live Births               Home Medications    Prior to Admission medications   Medication Sig Start Date End Date Taking? Authorizing Provider  ARIPiprazole (ABILIFY) 15 MG tablet Take 1 tablet by mouth at bedtime. 12/21/17  Yes Noemi Chapel, NP  atorvastatin (LIPITOR) 40 MG tablet Take 1 tablet (40 mg total) by mouth daily. 08/28/18  Yes Scot Jun, FNP  diclofenac sodium (VOLTAREN) 1 % GEL Apply 4 g topically 4 (four) times daily. Patient taking differently: Apply 4 g topically 4 (four) times daily as needed (pain).  09/17/18  Yes Joy, Shawn C, PA-C  DULoxetine (CYMBALTA) 60 MG capsule Take 60 mg by mouth at bedtime. 11/04/16  Yes [provider]  HYDROcodone-acetaminophen (NORCO/VICODIN) 5-325 MG tablet Take 1-2 tablets by mouth every 6 (six) hours as needed for severe pain. 09/17/18  Yes Joy, Shawn C, PA-C  lidocaine (LIDODERM) 5 % Place 1  patch onto the skin daily. Remove & Discard patch within 12 hours or as directed by MD Patient taking differently: Place 1 patch onto the skin daily as needed (pain). Remove & Discard patch within 12 hours or as directed by MD 09/17/18  Yes Joy, Shawn C, PA-C  losartan (COZAAR) 100 MG tablet Take 1 tablet daily by mouth. 08/24/18  Yes Scot Jun, FNP  methocarbamol (ROBAXIN) 500 MG tablet Take 1 tablet (500 mg total) by mouth 2 (two) times daily. Patient taking differently: Take 500 mg by mouth 2 (two) times daily as needed for muscle spasms.  09/17/18  Yes Joy, Shawn C, PA-C  omeprazole (PRILOSEC) 20 MG capsule Take 20 mg by mouth at bedtime.    Yes [provider]  sitaGLIPtin (JANUVIA) 100 MG tablet Take 1 tablet (100 mg total) by mouth daily. 08/28/18  Yes Scot Jun, FNP  doxycycline (VIBRAMYCIN) 100 MG capsule Take 1 capsule (100 mg total) by mouth 2 (two) times daily. 10/13/18   Tuan Tippin, Jarrett Soho, PA-C    Family History Family History  Problem Relation Age of Onset  . Diabetes Mother   . Breast cancer Mother 36  . Thyroid disease Mother   . Depression Mother   . Hypertension Father   . Heart disease Father   . Heart attack Father 24       Two instances, first at age 90  . Stroke Father   . Alcohol abuse Father   . Throat cancer Father   . Diabetes Maternal Grandmother   . Depression Maternal Grandmother   . Uterine cancer Maternal Grandmother     Social History Social History   Tobacco Use  . Smoking status: Never Smoker  . Smokeless tobacco: Never Used  Substance Use Topics  . Alcohol use: No  . Drug use: No     Allergies   Lamotrigine   Review of Systems Review of Systems  Constitutional: Negative for appetite change, diaphoresis, fatigue, fever and unexpected weight change.  HENT: Negative for mouth sores.   Eyes: Negative for visual disturbance.  Respiratory: Negative for cough, chest tightness, shortness of breath and wheezing.    Cardiovascular: Negative for chest pain.  Gastrointestinal: Negative for abdominal pain, constipation, diarrhea, nausea and vomiting.  Endocrine: Negative for polydipsia, polyphagia and polyuria.  Genitourinary: Negative for dysuria, frequency, hematuria and urgency.  Musculoskeletal: Negative for back pain and neck stiffness.  Skin: Positive for wound. Negative for rash.  Allergic/Immunologic: Negative for immunocompromised state.  Neurological: Negative for syncope, light-headedness and headaches.  Hematological: Does not bruise/bleed easily.  Psychiatric/Behavioral: Negative for sleep disturbance. The patient is not nervous/anxious.      Physical Exam Updated Vital Signs BP 132/83   Pulse (!) 103  Temp 98.2 F (36.8 C) (Oral)   Resp 18   LMP 05/12/2011 Comment: no period in 7 years   SpO2 95%   Physical Exam Exam conducted with a chaperone present.  Constitutional:      Appearance: Normal appearance.  HENT:     Head: Normocephalic.  Neck:     Musculoskeletal: Normal range of motion.  Cardiovascular:     Rate and Rhythm: Normal rate.  Pulmonary:     Effort: Pulmonary effort is normal.  Abdominal:     General: There is no distension.     Palpations: Abdomen is soft.     Tenderness: There is no abdominal tenderness.     Hernia: There is no hernia in the left inguinal area or right inguinal area.  Genitourinary:    Exam position: Supine.     Pubic Area: No rash.      Labia:        Right: Lesion present. No rash, tenderness or injury.        Left: No rash, tenderness, lesion or injury.     Lymphadenopathy:     Lower Body: No right inguinal adenopathy. No left inguinal adenopathy.  Skin:    General: Skin is warm and dry.  Neurological:     Mental Status: She is alert.     GCS: GCS eye subscore is 4. GCS verbal subscore is 5. GCS motor subscore is 6.  Psychiatric:        Mood and Affect: Mood is anxious.      ED Treatments / Results   Procedures  Procedures (including critical care time)  Medications Ordered in ED Medications  doxycycline (VIBRA-TABS) tablet 100 mg (100 mg Oral Given 10/13/18 0549)     Initial Impression / Assessment and Plan / ED Course  I have reviewed the triage vital signs and the nursing notes.  Pertinent labs & imaging results that were available during my care of the patient were reviewed by me and considered in my medical decision making (see chart for details).  Clinical Course as of Oct 12 716  Fri Oct 13, 2018  [id number] Mild tachycardia - pt anxious.  No signs of sepsis  Pulse Rate(!): 103 [HM]    Clinical Course User Index [HM] Keli Buehner, Jarrett Soho, PA-C       Patient with labial lesion.  On exam patient has small abscess which is open and draining.  No surrounding cellulitis.  No inguinal lymphadenopathy.  Mild tachycardia however patient is significantly anxious.  She is afebrile without other signs or symptoms of sepsis.  Will give doxycycline.  Discussed conservative therapies including warm compresses and/or soaks and close follow-up with OB/GYN.  Also discussed reasons to return immediately to the emergency department.  Patient states understanding and is in agreement with the plan.  Final Clinical Impressions(s) / ED Diagnoses   Final diagnoses:  Labial abscess    ED Discharge Orders         Ordered    doxycycline (VIBRAMYCIN) 100 MG capsule  2 times daily     10/13/18 0536           Margrete Delude, Jarrett Soho, PA-C [id number]    Delora Fuel, MD [id number] [phone]

## 2018-11-08 ENCOUNTER — Encounter: Payer: Self-pay | Admitting: Gynecology

## 2018-11-09 ENCOUNTER — Other Ambulatory Visit: Payer: Self-pay

## 2018-11-09 ENCOUNTER — Other Ambulatory Visit
Admission: RE | Admit: 2018-11-09 | Discharge: 2018-11-09 | Disposition: A | Discharge status: Home / Self Care | Payer: Federal, State, Local not specified - PPO | Source: Ambulatory Visit | Attending: Gastroenterology | Admitting: Gastroenterology

## 2018-11-09 DIAGNOSIS — Z01812 Encounter for preprocedural laboratory examination: Secondary | ICD-10-CM | POA: Diagnosis not present

## 2018-11-09 DIAGNOSIS — Z20828 Contact with and (suspected) exposure to other viral communicable diseases: Secondary | ICD-10-CM | POA: Insufficient documentation

## 2018-11-10 ENCOUNTER — Encounter: Payer: Self-pay | Admitting: *Deleted

## 2018-11-10 LAB — SARS CORONAVIRUS 2 (TAT 6-24 HRS): SARS Coronavirus 2: NEGATIVE

## 2018-11-13 ENCOUNTER — Ambulatory Visit: Payer: Federal, State, Local not specified - PPO | Admitting: Anesthesiology

## 2018-11-13 ENCOUNTER — Ambulatory Visit
Admission: RE | Admit: 2018-11-13 | Discharge: 2018-11-13 | Disposition: A | Discharge status: Home / Self Care | Payer: Federal, State, Local not specified - PPO | Attending: Gastroenterology | Admitting: Gastroenterology

## 2018-11-13 ENCOUNTER — Encounter: Payer: Self-pay | Admitting: Emergency Medicine

## 2018-11-13 ENCOUNTER — Other Ambulatory Visit: Payer: Self-pay

## 2018-11-13 ENCOUNTER — Encounter
Admission: RE | Disposition: A | Discharge status: Home / Self Care | Payer: Self-pay | Source: Home / Self Care | Attending: Gastroenterology

## 2018-11-13 DIAGNOSIS — F319 Bipolar disorder, unspecified: Secondary | ICD-10-CM | POA: Diagnosis not present

## 2018-11-13 DIAGNOSIS — Z7984 Long term (current) use of oral hypoglycemic drugs: Secondary | ICD-10-CM | POA: Insufficient documentation

## 2018-11-13 DIAGNOSIS — F419 Anxiety disorder, unspecified: Secondary | ICD-10-CM | POA: Insufficient documentation

## 2018-11-13 DIAGNOSIS — K219 Gastro-esophageal reflux disease without esophagitis: Secondary | ICD-10-CM | POA: Diagnosis not present

## 2018-11-13 DIAGNOSIS — E7800 Pure hypercholesterolemia, unspecified: Secondary | ICD-10-CM | POA: Diagnosis not present

## 2018-11-13 DIAGNOSIS — Z79899 Other long term (current) drug therapy: Secondary | ICD-10-CM | POA: Diagnosis not present

## 2018-11-13 DIAGNOSIS — K3189 Other diseases of stomach and duodenum: Secondary | ICD-10-CM | POA: Diagnosis present

## 2018-11-13 DIAGNOSIS — E119 Type 2 diabetes mellitus without complications: Secondary | ICD-10-CM | POA: Diagnosis not present

## 2018-11-13 DIAGNOSIS — K31A0 Gastric intestinal metaplasia, unspecified: Secondary | ICD-10-CM

## 2018-11-13 DIAGNOSIS — I10 Essential (primary) hypertension: Secondary | ICD-10-CM | POA: Diagnosis not present

## 2018-11-13 HISTORY — PX: ESOPHAGOGASTRODUODENOSCOPY (EGD) WITH PROPOFOL: SHX5813

## 2018-11-13 LAB — GLUCOSE, CAPILLARY: Glucose-Capillary: 277 mg/dL — ABNORMAL HIGH (ref 70–99)

## 2018-11-13 SURGERY — ESOPHAGOGASTRODUODENOSCOPY (EGD) WITH PROPOFOL
Anesthesia: General

## 2018-11-13 MED ORDER — INSULIN ASPART 100 UNIT/ML ~~LOC~~ SOLN
4.0000 [IU] | Freq: Once | SUBCUTANEOUS | Status: AC
  Administered 2018-11-13: 08:00:00 4 [IU] via INTRAVENOUS

## 2018-11-13 MED ORDER — LIDOCAINE HCL (PF) 2 % IJ SOLN
INTRAMUSCULAR | Status: AC
  Filled 2018-11-13: qty 10

## 2018-11-13 MED ORDER — PROPOFOL 10 MG/ML IV BOLUS
INTRAVENOUS | Status: DC | PRN
  Administered 2018-11-13: 30 mg via INTRAVENOUS

## 2018-11-13 MED ORDER — INSULIN REGULAR HUMAN 100 UNIT/ML IJ SOLN
4.0000 [IU] | Freq: Once | INTRAMUSCULAR | Status: DC
  Filled 2018-11-13: qty 10

## 2018-11-13 MED ORDER — LIDOCAINE HCL (PF) 2 % IJ SOLN
INTRAMUSCULAR | Status: DC | PRN
  Administered 2018-11-13: 80 mg

## 2018-11-13 MED ORDER — PHENYLEPHRINE HCL (PRESSORS) 10 MG/ML IV SOLN
INTRAVENOUS | Status: AC
  Filled 2018-11-13: qty 1

## 2018-11-13 MED ORDER — INSULIN ASPART 100 UNIT/ML ~~LOC~~ SOLN
SUBCUTANEOUS | Status: AC
  Administered 2018-11-13: 4 [IU] via INTRAVENOUS
  Filled 2018-11-13: qty 1

## 2018-11-13 MED ORDER — MIDAZOLAM HCL 5 MG/5ML IJ SOLN
INTRAMUSCULAR | Status: DC | PRN
  Administered 2018-11-13: 2 mg via INTRAVENOUS

## 2018-11-13 MED ORDER — PROPOFOL 500 MG/50ML IV EMUL
INTRAVENOUS | Status: AC
  Filled 2018-11-13: qty 50

## 2018-11-13 MED ORDER — EPHEDRINE SULFATE 50 MG/ML IJ SOLN
INTRAMUSCULAR | Status: AC
  Filled 2018-11-13: qty 1

## 2018-11-13 MED ORDER — MIDAZOLAM HCL 2 MG/2ML IJ SOLN
INTRAMUSCULAR | Status: AC
  Filled 2018-11-13: qty 2

## 2018-11-13 MED ORDER — SODIUM CHLORIDE 0.9 % IV SOLN
INTRAVENOUS | Status: DC
  Administered 2018-11-13: 1000 mL via INTRAVENOUS

## 2018-11-13 MED ORDER — PROPOFOL 500 MG/50ML IV EMUL
INTRAVENOUS | Status: DC | PRN
  Administered 2018-11-13: 50 ug/kg/min via INTRAVENOUS

## 2018-11-13 MED ORDER — FENTANYL CITRATE (PF) 100 MCG/2ML IJ SOLN
INTRAMUSCULAR | Status: AC
  Filled 2018-11-13: qty 2

## 2018-11-13 NOTE — Transfer of Care (Signed)
Immediate Anesthesia Transfer of Care Note  Patient: Morgan [name]  Procedure(s) Performed: ESOPHAGOGASTRODUODENOSCOPY (EGD) WITH PROPOFOL (N/A )  Patient Location: PACU  Anesthesia Type:General  Level of Consciousness: awake, alert  and oriented  Airway & Oxygen Therapy: Patient Spontanous Breathing  Post-op Assessment: Report given to RN and Post -op Vital signs reviewed and stable  Post vital signs: Reviewed and stable  Last Vitals:  Vitals Value Taken Time  BP 120/87 11/13/18 0826  Temp 36.1 C 11/13/18 0826  Pulse 92 11/13/18 0827  Resp 22 11/13/18 0827  SpO2 99 % 11/13/18 0827  Vitals shown include unvalidated device data.  Last Pain:  Vitals:   11/13/18 0826  TempSrc: Tympanic  PainSc: Asleep         Complications: No apparent anesthesia complications

## 2018-11-13 NOTE — H&P (Signed)
[name], MD [address], [place], [place], [place], [address] [address], [place], [place], [place], [address] Phone: [phone]  Fax: [phone]  Primary Care Physician:  Scot Jun, FNP   Pre-Procedure History & Physical: HPI:  Morgan [name] is a 53 y.o. female is here for an endoscopy    Past Medical History:  Diagnosis Date  . Anxiety   . Bipolar disorder ([place])   . Breast mass    right  . Cervical dysplasia   . Diabetes mellitus without complication ([place])   . Endometriosis   . GERD (gastroesophageal reflux disease)   . Hypercholesteremia   . Hypertension   . Migraine   . Pelvic kidney    left  . Postpartum depression    hx of    Past Surgical History:  Procedure Laterality Date  . BACK SURGERY  2014  . BREAST LUMPECTOMY WITH RADIOACTIVE SEED LOCALIZATION Right 10/28/2014   Procedure: RIGHT BREAST LUMPECTOMY WITH RADIOACTIVE SEED LOCALIZATION;  Surgeon: [name], MD;  Location: [facility];  Service: General;  Laterality: Right;  . CESAREAN SECTION  03  . COLONOSCOPY WITH PROPOFOL N/A 05/01/2018   Procedure: COLONOSCOPY WITH PROPOFOL;  Surgeon: [name], MD;  Location: [facility] ENDOSCOPY;  Service: Gastroenterology;  Laterality: N/A;  . COLPOSCOPY    . ESOPHAGOGASTRODUODENOSCOPY (EGD) WITH PROPOFOL N/A 05/01/2018   Procedure: ESOPHAGOGASTRODUODENOSCOPY (EGD) WITH PROPOFOL;  Surgeon: [name], MD;  Location: [facility] ENDOSCOPY;  Service: Gastroenterology;  Laterality: N/A;  . GYNECOLOGIC CRYOSURGERY    . NECK SURGERY  2012  . PELVIC LAPAROSCOPY  91    Prior to Admission medications   Medication Sig Start Date End Date Taking? Authorizing Provider  ARIPiprazole (ABILIFY) 15 MG tablet Take 1 tablet by mouth at bedtime. 12/21/17  Yes Noemi Chapel, NP  atorvastatin (LIPITOR) 40 MG tablet Take 1 tablet (40 mg total) by mouth daily. 08/28/18  Yes Scot Jun, FNP  DULoxetine (CYMBALTA) 60 MG capsule Take 60 mg by mouth  at bedtime. 11/04/16  Yes [provider]  HYDROcodone-acetaminophen (NORCO/VICODIN) 5-325 MG tablet Take 1-2 tablets by mouth every 6 (six) hours as needed for severe pain. 09/17/18  Yes Joy, Shawn C, PA-C  lidocaine (LIDODERM) 5 % Place 1 patch onto the skin daily. Remove & Discard patch within 12 hours or as directed by MD Patient taking differently: Place 1 patch onto the skin daily as needed (pain). Remove & Discard patch within 12 hours or as directed by MD 09/17/18  Yes Joy, Shawn C, PA-C  losartan (COZAAR) 100 MG tablet Take 1 tablet daily by mouth. 08/24/18  Yes Scot Jun, FNP  omeprazole (PRILOSEC) 20 MG capsule Take 20 mg by mouth at bedtime.    Yes [provider]  sitaGLIPtin (JANUVIA) 100 MG tablet Take 1 tablet (100 mg total) by mouth daily. 08/28/18  Yes Scot Jun, FNP  diclofenac sodium (VOLTAREN) 1 % GEL Apply 4 g topically 4 (four) times daily. Patient not taking: Reported on 11/13/2018 09/17/18   Arlean Hopping C, PA-C  doxycycline (VIBRAMYCIN) 100 MG capsule Take 1 capsule (100 mg total) by mouth 2 (two) times daily. Patient not taking: Reported on 11/13/2018 10/13/18   Muthersbaugh, Jarrett Soho, PA-C  methocarbamol (ROBAXIN) 500 MG tablet Take 1 tablet (500 mg total) by mouth 2 (two) times daily. Patient not taking: Reported on 11/13/2018 09/17/18   Lorayne Bender, PA-C    Allergies as of 09/14/2018 - Review  Complete 09/13/2018  Allergen Reaction Noted  . Lamotrigine Rash 08/29/2014    Family History  Problem Relation Age of Onset  . Diabetes Mother   . Breast cancer Mother 75  . Thyroid disease Mother   . Depression Mother   . Hypertension Father   . Heart disease Father   . Heart attack Father 27       Two instances, first at age 51  . Stroke Father   . Alcohol abuse Father   . Throat cancer Father   . Diabetes Maternal Grandmother   . Depression Maternal Grandmother   . Uterine cancer Maternal Grandmother     Social History   Socioeconomic  History  . Marital status: Married    Spouse name: Not on file  . Number of children: Not on file  . Years of education: Not on file  . Highest education level: Not on file  Occupational History  . Not on file  Social Needs  . Financial resource strain: Not on file  . Food insecurity    Worry: Not on file    Inability: Not on file  . Transportation needs    Medical: Not on file    Non-medical: Not on file  Tobacco Use  . Smoking status: Never Smoker  . Smokeless tobacco: Never Used  Substance and Sexual Activity  . Alcohol use: No  . Drug use: No  . Sexual activity: Yes    Birth control/protection: Other-see comments    Comment: vasectomy  Lifestyle  . Physical activity    Days per week: Not on file    Minutes per session: Not on file  . Stress: Not on file  Relationships  . Social Herbalist on phone: Not on file    Gets together: Not on file    Attends religious service: Not on file    Active member of club or organization: Not on file    Attends meetings of clubs or organizations: Not on file    Relationship status: Not on file  . Intimate partner violence    Fear of current or ex partner: Not on file    Emotionally abused: Not on file    Physically abused: Not on file    Forced sexual activity: Not on file  Other Topics Concern  . Not on file  Social History Narrative  . Not on file    Review of Systems: See HPI, otherwise negative ROS  Physical Exam: BP 124/80   Pulse 85   Temp (!) 95.9 F (35.5 C) (Tympanic)   Resp 17   Ht 5\' 1"  (1.549 m)   Wt 86.2 kg   LMP 05/12/2011 Comment: no period in 7 years   SpO2 98%   BMI 35.90 kg/m  General:   Alert,  pleasant and cooperative in NAD Head:  Normocephalic and atraumatic. Neck:  Supple; no masses or thyromegaly. Lungs:  Clear throughout to auscultation, normal respiratory effort.    Heart:  +S1, +S2, Regular rate and rhythm, No edema. Abdomen:  Soft, nontender and nondistended. Normal bowel  sounds, without guarding, and without rebound.   Neurologic:  Alert and  oriented x4;  grossly normal neurologically.  Impression/Plan: Morgan [name] is here for an endoscopy  to be performed for  evaluation of gastric intestinal metaplasia    Risks, benefits, limitations, and alternatives regarding endoscopy have been reviewed with the patient.  Questions have been answered.  All parties agreeable.   [name], MD  11/13/2018, 8:06 AM

## 2018-11-13 NOTE — Op Note (Signed)
[facility] Gastroenterology Patient Name: Morgan [name] Procedure Date: 11/13/2018 7:47 AM MRN: [id number] Account #: [id number] Date of Birth: 11/12/1965 Admit Type: Outpatient Age: 53 Room: [facility] ENDO ROOM 4 Gender: Female Note Status: Finalized Procedure:            Upper GI endoscopy Indications:          Follow-up of intestinal metaplasia Providers:            [name] MD, MD Referring MD:         Sedalia Muta (Referring MD) Medicines:            Monitored Anesthesia Care Complications:        No immediate complications. Procedure:            Pre-Anesthesia Assessment:                       - Prior to the procedure, a History and Physical was                        performed, and patient medications, allergies and                        sensitivities were reviewed. The patient's tolerance of                        previous anesthesia was reviewed.                       - The risks and benefits of the procedure and the                        sedation options and risks were discussed with the                        patient. All questions were answered and informed                        consent was obtained.                       - ASA Grade Assessment: II - A patient with mild                        systemic disease.                       After obtaining informed consent, the endoscope was                        passed under direct vision. Throughout the procedure,                        the patient's blood pressure, pulse, and oxygen                        saturations were monitored continuously. The Endoscope                        was introduced through the mouth, and advanced to the  third part of duodenum. The upper GI endoscopy was                        accomplished with ease. The patient tolerated the                        procedure well. Findings:      The examined duodenum was normal.      The esophagus was  normal.      The cardia and gastric fundus were normal on retroflexion.      The entire examined stomach was normal. Biopsies were taken with a cold       forceps for histology. Impression:           - Normal examined duodenum.                       - Normal esophagus.                       - Normal stomach. Biopsied. Recommendation:       - Await pathology results.                       - Discharge patient to home (with escort).                       - Resume previous diet.                       - Continue present medications.                       - Await pathology results.                       - Return to my office as previously scheduled. Procedure Code(s):    --- Professional ---                       [phone], Esophagogastroduodenoscopy, flexible, transoral;                        with biopsy, single or multiple Diagnosis Code(s):    --- Professional ---                       K31.89, Other diseases of stomach and duodenum CPT copyright 2019 American Medical Association. All rights reserved. The codes documented in this report are preliminary and upon coder review may  be revised to meet current compliance requirements. [name], MD [name] MD, MD 11/13/2018 8:20:13 AM This report has been signed electronically. Number of Addenda: 0 Note Initiated On: 11/13/2018 7:47 AM Estimated Blood Loss: Estimated blood loss: none.      [facility]

## 2018-11-13 NOTE — Anesthesia Preprocedure Evaluation (Signed)
Anesthesia Evaluation  Patient identified by MRN, date of birth, ID band Patient awake    Reviewed: Allergy & Precautions, NPO status , Patient's Chart, lab work & pertinent test results  History of Anesthesia Complications Negative for: history of anesthetic complications  Airway Mallampati: II       Dental   Pulmonary neg sleep apnea, neg COPD, Not current smoker,           Cardiovascular hypertension, Pt. on medications (-) Past MI and (-) CHF (-) dysrhythmias (-) Valvular Problems/Murmurs     Neuro/Psych neg Seizures Anxiety Depression Bipolar Disorder    GI/Hepatic Neg liver ROS, GERD  Medicated and Controlled,  Endo/Other  diabetes, Type 2, Oral Hypoglycemic Agents  Renal/GU Renal disease (pelvic kidney)     Musculoskeletal   Abdominal   Peds  Hematology   Anesthesia Other Findings   Reproductive/Obstetrics                             Anesthesia Physical Anesthesia Plan  ASA: II  Anesthesia Plan: General   Post-op Pain Management:    Induction:   PONV Risk Score and Plan: 3 and Propofol infusion, TIVA and Ondansetron  Airway Management Planned: Nasal Cannula  Additional Equipment:   Intra-op Plan:   Post-operative Plan:   Informed Consent: I have reviewed the patients History and Physical, chart, labs and discussed the procedure including the risks, benefits and alternatives for the proposed anesthesia with the patient or authorized representative who has indicated his/her understanding and acceptance.       Plan Discussed with:   Anesthesia Plan Comments:         Anesthesia Quick Evaluation

## 2018-11-13 NOTE — Anesthesia Post-op Follow-up Note (Signed)
Anesthesia QCDR form completed.        

## 2018-11-13 NOTE — Anesthesia Postprocedure Evaluation (Signed)
Anesthesia Post Note  Patient: Morgan [name]  Procedure(s) Performed: ESOPHAGOGASTRODUODENOSCOPY (EGD) WITH PROPOFOL (N/A )  Patient location during evaluation: Endoscopy Anesthesia Type: General Level of consciousness: awake and alert Pain management: pain level controlled Vital Signs Assessment: post-procedure vital signs reviewed and stable Respiratory status: spontaneous breathing and respiratory function stable Cardiovascular status: stable Anesthetic complications: no     Last Vitals:  Vitals:   11/13/18 0734 11/13/18 0826  BP: 124/80 120/87  Pulse: 85   Resp: 17   Temp: (!) 35.5 C (!) 36.1 C  SpO2: 98%     Last Pain:  Vitals:   11/13/18 0826  TempSrc: Tympanic  PainSc: Asleep                 KEPHART,WILLIAM K

## 2018-11-14 ENCOUNTER — Encounter: Payer: Self-pay | Admitting: Gastroenterology

## 2018-11-14 LAB — SURGICAL PATHOLOGY

## 2018-11-15 ENCOUNTER — Encounter: Payer: Self-pay | Admitting: Gastroenterology

## 2018-11-25 ENCOUNTER — Other Ambulatory Visit: Payer: Self-pay

## 2018-11-25 ENCOUNTER — Emergency Department (HOSPITAL_COMMUNITY)
Admission: EM | Admit: 2018-11-25 | Discharge: 2018-11-26 | Disposition: A | Discharge status: Home / Self Care | Payer: Federal, State, Local not specified - PPO | Attending: Emergency Medicine | Admitting: Emergency Medicine

## 2018-11-25 ENCOUNTER — Emergency Department (HOSPITAL_COMMUNITY): Payer: Federal, State, Local not specified - PPO

## 2018-11-25 ENCOUNTER — Encounter (HOSPITAL_COMMUNITY): Payer: Self-pay | Admitting: *Deleted

## 2018-11-25 DIAGNOSIS — I10 Essential (primary) hypertension: Secondary | ICD-10-CM | POA: Diagnosis not present

## 2018-11-25 DIAGNOSIS — R739 Hyperglycemia, unspecified: Secondary | ICD-10-CM

## 2018-11-25 DIAGNOSIS — Z7984 Long term (current) use of oral hypoglycemic drugs: Secondary | ICD-10-CM | POA: Diagnosis not present

## 2018-11-25 DIAGNOSIS — R519 Headache, unspecified: Secondary | ICD-10-CM | POA: Insufficient documentation

## 2018-11-25 DIAGNOSIS — Z20828 Contact with and (suspected) exposure to other viral communicable diseases: Secondary | ICD-10-CM | POA: Diagnosis not present

## 2018-11-25 DIAGNOSIS — Z79899 Other long term (current) drug therapy: Secondary | ICD-10-CM | POA: Diagnosis not present

## 2018-11-25 DIAGNOSIS — R1033 Periumbilical pain: Secondary | ICD-10-CM | POA: Insufficient documentation

## 2018-11-25 DIAGNOSIS — R531 Weakness: Secondary | ICD-10-CM | POA: Insufficient documentation

## 2018-11-25 DIAGNOSIS — E1165 Type 2 diabetes mellitus with hyperglycemia: Secondary | ICD-10-CM | POA: Diagnosis not present

## 2018-11-25 DIAGNOSIS — M791 Myalgia, unspecified site: Secondary | ICD-10-CM | POA: Insufficient documentation

## 2018-11-25 LAB — URINALYSIS, ROUTINE W REFLEX MICROSCOPIC
Bilirubin Urine: NEGATIVE
Glucose, UA: >=500 mg/dL — AB
Hgb urine dipstick: NEGATIVE
Ketones, ur: NEGATIVE mg/dL
Leukocytes,Ua: NEGATIVE
Nitrite: NEGATIVE
Protein, ur: NEGATIVE mg/dL
Specific Gravity, Urine: 1.031 — ABNORMAL HIGH (ref 1.005–1.030)
pH: 6 (ref 5.0–8.0)

## 2018-11-25 LAB — CBC
HCT: 39.6 % (ref 36.0–46.0)
Hemoglobin: 13 g/dL (ref 12.0–15.0)
MCH: 27.6 pg (ref 26.0–34.0)
MCHC: 32.8 g/dL (ref 30.0–36.0)
MCV: 84.1 fL (ref 80.0–100.0)
Platelets: 194 10*3/uL (ref 150–400)
RBC: 4.71 MIL/uL (ref 3.87–5.11)
RDW: 13.6 % (ref 11.5–15.5)
WBC: 5.9 10*3/uL (ref 4.0–10.5)
nRBC: 0 % (ref 0.0–0.2)

## 2018-11-25 LAB — CBG MONITORING, ED: Glucose-Capillary: 344 mg/dL — ABNORMAL HIGH (ref 70–99)

## 2018-11-25 LAB — COMPREHENSIVE METABOLIC PANEL
ALT: 42 U/L (ref 0–44)
AST: 32 U/L (ref 15–41)
Albumin: 3.6 g/dL (ref 3.5–5.0)
Alkaline Phosphatase: 109 U/L (ref 38–126)
Anion gap: 10 (ref 5–15)
BUN: 6 mg/dL (ref 6–20)
CO2: 25 mmol/L (ref 22–32)
Calcium: 9.5 mg/dL (ref 8.9–10.3)
Chloride: 99 mmol/L (ref 98–111)
Creatinine, Ser: 0.67 mg/dL (ref 0.44–1.00)
GFR calc Af Amer: >60 mL/min (ref >60)
GFR calc non Af Amer: >60 mL/min (ref >60)
Glucose, Bld: 371 mg/dL — ABNORMAL HIGH (ref 70–99)
Potassium: 3.8 mmol/L (ref 3.5–5.1)
Sodium: 134 mmol/L — ABNORMAL LOW (ref 135–145)
Total Bilirubin: 0.5 mg/dL (ref 0.3–1.2)
Total Protein: 6.6 g/dL (ref 6.5–8.1)

## 2018-11-25 LAB — LIPASE, BLOOD: Lipase: 26 U/L (ref 11–51)

## 2018-11-25 LAB — I-STAT BETA HCG BLOOD, ED (MC, WL, AP ONLY): I-stat hCG, quantitative: <5 m[IU]/mL (ref <5)

## 2018-11-25 LAB — TROPONIN I (HIGH SENSITIVITY): Troponin I (High Sensitivity): <2 ng/L (ref <18)

## 2018-11-25 MED ORDER — IOHEXOL 300 MG/ML  SOLN
100.0000 mL | Freq: Once | INTRAMUSCULAR | Status: AC | PRN
  Administered 2018-11-25: 100 mL via INTRAVENOUS

## 2018-11-25 MED ORDER — SODIUM CHLORIDE 0.9% FLUSH: 3.0000 mL | Freq: Once | INTRAVENOUS | Status: DC

## 2018-11-25 MED ORDER — SODIUM CHLORIDE 0.9 % IV BOLUS
1000.0000 mL | Freq: Once | INTRAVENOUS | Status: AC
  Administered 2018-11-25: 1000 mL via INTRAVENOUS

## 2018-11-25 NOTE — ED Triage Notes (Signed)
The pt is c/i not feeling well since this am with a headache no temp  Blood sugar is high  She does not take insulin

## 2018-11-25 NOTE — ED Provider Notes (Signed)
[facility] EMERGENCY DEPARTMENT Provider Note   CSN: [id number] Arrival date & time: 11/25/18  1808     History   Chief Complaint Chief Complaint  Patient presents with  . Hyperglycemia    HPI Echo R Rozen is a 53 y.o. female presenting for evaluation of HA and generalized weakness.   Pt states she woke up this morning feeling poorly. She reports generalized aches, especially in her chest. She has had a constant HA. She reports nausea without vomiting, and periumbilical pain.  she denies fevers, chills, cough, sob, urinary sxs, abnormal BMs. Pt noticed her BGL was elevated in the 300s. She took an extra dose of her januvia around 1 PM. Her BGLs recently have been in the 200s, as her metformin was d/c due to side effects. Has an apt with PCP next week. She denies sick contacts. denies contact with known covid-19 positive person. She has additional h/o htn and bipolar, no other medical problems. No recent change in meds besides d/c metformin.      HPI  Past Medical History:  Diagnosis Date  . Anxiety   . Bipolar disorder ([place])   . Breast mass    right  . Cervical dysplasia   . Diabetes mellitus without complication ([place])   . Endometriosis   . GERD (gastroesophageal reflux disease)   . Hypercholesteremia   . Hypertension   . Migraine   . Pelvic kidney    left  . Postpartum depression    hx of    Patient Active Problem List   Diagnosis Date Noted  . Chest pain 06/14/2017  . Hypertension complicating diabetes ([place]) 06/14/2017  . Type 2 diabetes mellitus with hyperlipidemia ([place]) 06/14/2017  . Hyperlipidemia associated with type 2 diabetes mellitus ([place]) 06/14/2017  . Cervical dysplasia   . Bipolar disorder [facility])     Past Surgical History:  Procedure Laterality Date  . BACK SURGERY  2014  . BREAST LUMPECTOMY WITH RADIOACTIVE SEED LOCALIZATION Right 10/28/2014   Procedure: RIGHT BREAST LUMPECTOMY WITH RADIOACTIVE SEED LOCALIZATION;  Surgeon:  [name], MD;  Location: [facility];  Service: General;  Laterality: Right;  . CESAREAN SECTION  03  . COLONOSCOPY WITH PROPOFOL N/A 05/01/2018   Procedure: COLONOSCOPY WITH PROPOFOL;  Surgeon: [name], MD;  Location: [facility] ENDOSCOPY;  Service: Gastroenterology;  Laterality: N/A;  . COLPOSCOPY    . ESOPHAGOGASTRODUODENOSCOPY (EGD) WITH PROPOFOL N/A 05/01/2018   Procedure: ESOPHAGOGASTRODUODENOSCOPY (EGD) WITH PROPOFOL;  Surgeon: [name], MD;  Location[facility] ENDOSCOPY;  Service: Gastroenterology;  Laterality: N/A;  . ESOPHAGOGASTRODUODENOSCOPY (EGD) WITH PROPOFOL N/A 11/13/2018   Procedure: ESOPHAGOGASTRODUODENOSCOPY (EGD) WITH PROPOFOL;  Surgeon: [name], MD;  Location: [facility] ENDOSCOPY;  Service: Gastroenterology;  Laterality: N/A;  . GYNECOLOGIC CRYOSURGERY    . NECK SURGERY  2012  . PELVIC LAPAROSCOPY  61     OB History    Gravida  1   Para  1   Term  1   Preterm      AB      Living  1     SAB      TAB      Ectopic      Multiple      Live Births               Home Medications    Prior to Admission medications   Medication Sig Start Date End Date Taking? Authorizing Provider  ARIPiprazole (ABILIFY) 15 MG tablet Take 1 tablet by  mouth at bedtime. 12/21/17   Noemi Chapel, NP  atorvastatin (LIPITOR) 40 MG tablet Take 1 tablet (40 mg total) by mouth daily. 08/28/18   Scot Jun, FNP  diclofenac sodium (VOLTAREN) 1 % GEL Apply 4 g topically 4 (four) times daily. Patient not taking: Reported on 11/13/2018 09/17/18   Arlean Hopping C, PA-C  doxycycline (VIBRAMYCIN) 100 MG capsule Take 1 capsule (100 mg total) by mouth 2 (two) times daily. Patient not taking: Reported on 11/13/2018 10/13/18   Muthersbaugh, Jarrett Soho, PA-C  DULoxetine (CYMBALTA) 60 MG capsule Take 60 mg by mouth at bedtime. 11/04/16   [provider]  HYDROcodone-acetaminophen (NORCO/VICODIN) 5-325 MG tablet Take 1-2 tablets by mouth every 6 (six) hours as needed for severe  pain. 09/17/18   Joy, Shawn C, PA-C  lidocaine (LIDODERM) 5 % Place 1 patch onto the skin daily. Remove & Discard patch within 12 hours or as directed by MD Patient taking differently: Place 1 patch onto the skin daily as needed (pain). Remove & Discard patch within 12 hours or as directed by MD 09/17/18   Joy, Shawn C, PA-C  losartan (COZAAR) 100 MG tablet Take 1 tablet daily by mouth. 08/24/18   Scot Jun, FNP  methocarbamol (ROBAXIN) 500 MG tablet Take 1 tablet (500 mg total) by mouth 2 (two) times daily. Patient not taking: Reported on 11/13/2018 09/17/18   Lorayne Bender, PA-C  omeprazole (PRILOSEC) 20 MG capsule Take 20 mg by mouth at bedtime.     [provider]  sitaGLIPtin (JANUVIA) 100 MG tablet Take 1 tablet (100 mg total) by mouth daily. 08/28/18   Scot Jun, FNP    Family History Family History  Problem Relation Age of Onset  . Diabetes Mother   . Breast cancer Mother 73  . Thyroid disease Mother   . Depression Mother   . Hypertension Father   . Heart disease Father   . Heart attack Father 72       Two instances, first at age 33  . Stroke Father   . Alcohol abuse Father   . Throat cancer Father   . Diabetes Maternal Grandmother   . Depression Maternal Grandmother   . Uterine cancer Maternal Grandmother     Social History Social History   Tobacco Use  . Smoking status: Never Smoker  . Smokeless tobacco: Never Used  Substance Use Topics  . Alcohol use: No  . Drug use: No     Allergies   Lamotrigine   Review of Systems Review of Systems  Gastrointestinal: Positive for abdominal pain and nausea.  Musculoskeletal: Positive for myalgias.  Neurological: Positive for headaches.  All other systems reviewed and are negative.    Physical Exam Updated Vital Signs BP 123/74   Pulse 80   Temp 98.4 F (36.9 C) (Oral)   Resp 15   Ht 5\' 1"  (1.549 m)   Wt 86.2 kg   LMP 05/12/2011 Comment: no period in 7 years   SpO2 96%   BMI 35.90 kg/m    Physical Exam Vitals signs and nursing note reviewed.  Constitutional:      General: She is not in acute distress.    Appearance: She is well-developed.     Comments: Appears nontoxic  HENT:     Head: Normocephalic and atraumatic.  Eyes:     Extraocular Movements: Extraocular movements intact.     Conjunctiva/sclera: Conjunctivae normal.     Pupils: Pupils are equal, round, and reactive to light.  Neck:     Musculoskeletal: Normal range of motion and neck supple.  Cardiovascular:     Rate and Rhythm: Normal rate and regular rhythm.     Pulses: Normal pulses.  Pulmonary:     Effort: Pulmonary effort is normal. No respiratory distress.     Breath sounds: Normal breath sounds. No wheezing.  Abdominal:     General: There is no distension.     Palpations: Abdomen is soft. There is no mass.     Tenderness: There is no abdominal tenderness. There is no guarding or rebound.     Comments: Generalized ttp of the abd. Worse on the R side. No rigidity, guarding, distention. Negative rebound.   Musculoskeletal: Normal range of motion.  Skin:    General: Skin is warm and dry.     Capillary Refill: Capillary refill takes less than 2 seconds.  Neurological:     Mental Status: She is alert and oriented to person, place, and time.      ED Treatments / Results  Labs (all labs ordered are listed, but only abnormal results are displayed) Labs Reviewed  COMPREHENSIVE METABOLIC PANEL - Abnormal; Notable for the following components:      Result Value   Sodium 134 (*)    Glucose, Bld 371 (*)    All other components within normal limits  URINALYSIS, ROUTINE W REFLEX MICROSCOPIC - Abnormal; Notable for the following components:   Specific Gravity, Urine 1.031 (*)    Glucose, UA >=500 (*)    Bacteria, UA RARE (*)    All other components within normal limits  CBG MONITORING, ED - Abnormal; Notable for the following components:   Glucose-Capillary 344 (*)    All other components within normal  limits  CBG MONITORING, ED - Abnormal; Notable for the following components:   Glucose-Capillary 194 (*)    All other components within normal limits  NOVEL CORONAVIRUS, NAA (HOSP ORDER, SEND-OUT TO REF LAB; TAT 18-24 HRS)  LIPASE, BLOOD  CBC  I-STAT BETA HCG BLOOD, ED ([facility], [facility], [facility] ONLY)  TROPONIN I (HIGH SENSITIVITY)    EKG EKG Interpretation  Date/Time:  Saturday November 25 2018 22:31:37 EDT Ventricular Rate:  85 PR Interval:    QRS Duration: 80 QT Interval:  347 QTC Calculation: 413 R Axis:   56 Text Interpretation:  Sinus rhythm When compared with ECG of 06/12/2018, No significant change was found Confirmed by Delora Fuel ([id number]) on 11/26/2018 12:06:23 AM   Radiology Dg Chest 2 View  Result Date: 11/25/2018 CLINICAL DATA:  Midline chest pain without exertion.  Hx DM, HTN. EXAM: CHEST - 2 VIEW COMPARISON:  Chest radiograph 06/12/2018 FINDINGS: Stable cardiomediastinal contours with normal heart size. The lungs are clear. No pneumothorax or pleural effusion. Cervical fixation hardware is noted. No acute finding in the visualized skeleton. IMPRESSION: No acute cardiopulmonary process. Electronically Signed   By: Audie Pinto M.D.   On: 11/25/2018 20:29   Ct Abdomen Pelvis W Contrast  Result Date: 11/25/2018 CLINICAL DATA:  Nausea, vomiting, elevated glucose EXAM: CT ABDOMEN AND PELVIS WITH CONTRAST TECHNIQUE: Multidetector CT imaging of the abdomen and pelvis was performed using the standard protocol following bolus administration of intravenous contrast. CONTRAST:  175mL OMNIPAQUE IOHEXOL 300 MG/ML  SOLN COMPARISON:  CT September 17, 2018, MR April 04, 2018 FINDINGS: Lower chest: Lung bases are clear. Normal heart size. No pericardial effusion. Hepatobiliary: Diffuse hepatic hypoattenuation compatible with hepatic steatosis. Focal hyperattenuating 1.6 cm rounded lesion in the posterior right lobe  liver with some internal hypoattenuation and centripetal filling on delayed images  matching the aortic blood pool most compatible with a an hepatic hemangioma. Not significantly changed in size from comparison MRI. No new concerning hepatic lesions. No gallstones, gallbladder wall thickening, or biliary dilatation. Pancreas: Fatty replacement of the pancreas. No pancreatic ductal dilatation or surrounding inflammatory changes. Spleen: Normal in size without focal abnormality. Adrenals/Urinary Tract: Normal adrenal glands. There is a left pelvic kidney. Few subcentimeter hypoattenuating foci in both kidneys are too small to fully characterize on CT imaging but statistically likely benign. Few nonobstructing calculi are present in both kidneys. No obstructive urolithiasis or hydronephrosis. Normal urinary bladder. Stomach/Bowel: Distal esophagus, stomach and duodenal sweep are unremarkable. No small bowel wall thickening or dilatation. No evidence of obstruction. Nonvisualization of the appendix. No pericecal inflammation to suggest findings of appendicitis. Vascular/Lymphatic: The aorta is normal caliber. No suspicious or enlarged lymph nodes in the included lymphatic chains. Reproductive: Normal anteverted uterus. No concerning adnexal lesions. Other: No abdominopelvic free fluid or free gas. No bowel containing hernias. Musculoskeletal: No acute osseous abnormality or suspicious osseous lesion. Postsurgical changes from prior L4-5 posterior spinal fusion. IMPRESSION: 1. No evidence of acute intra-abdominal process provide an explanation for the patient's symptoms. 2. Hepatic steatosis. 3. Stable appearance of the likely hemangioma posterior liver. 4. Bilateral nonobstructive nephrolithiasis.  Left pelvic kidney. Electronically Signed   By: Lovena Le M.D.   On: 11/25/2018 21:55    Procedures Procedures (including critical care time)  Medications Ordered in ED Medications  sodium chloride flush (NS) 0.9 % injection 3 mL (0 mLs Intravenous Hold 11/25/18 2243)  sodium chloride 0.9 % bolus  1,000 mL (0 mLs Intravenous Stopped 11/26/18 0002)  iohexol (OMNIPAQUE) 300 MG/ML solution 100 mL (100 mLs Intravenous Contrast Given 11/25/18 2113)     Initial Impression / Assessment and Plan / ED Course  I have reviewed the triage vital signs and the nursing notes.  Pertinent labs & imaging results that were available during my care of the patient were reviewed by me and considered in my medical decision making (see chart for details).        Pt presenting for evaluation of generalized body aches, HA, and hyperglycemia. Physical exam shows pt who appears nontoxic. Neuro intact. Pt does have generalized abd tenderness and no obvious reason for BGL elevation today. As such, will order labs and ct. Fluids for BGL. cxr to r/u respiratory cause for sxs.   Labs reassuring, slight hyperglycemia without signs of DKA. Trop negative. UA without ketones or infection.  cxr viewed and interpreted by me, no pna, pnx, effusion, cardiomegaly. ekg NSR. Ct abd pelvis without acute findings. discsused findings with pt. Discussed recheck of bgl. If improving, plan for covid OP testing and close f/u with PCP.   Repeat cbg improved at 194. No significant change in pt's sxs. discussed importance of close PCP f/u. At this time, pt appears safe for d/c. Return precautions given. Pt states she understands and agrees to plan.   Tesia R Stockinger was evaluated in Emergency Department on 11/26/2018 for the symptoms described in the history of present illness. She was evaluated in the context of the global COVID-19 pandemic, which necessitated consideration that the patient might be at risk for infection with the SARS-CoV-2 virus that causes COVID-19. Institutional protocols and algorithms that pertain to the evaluation of patients at risk for COVID-19 are in a state of rapid change based on information released by regulatory bodies including the  CDC and federal and [employer]. These policies and algorithms were  followed during the patient's care in the ED.   Final Clinical Impressions(s) / ED Diagnoses   Final diagnoses:  Hyperglycemia  Acute intractable headache, unspecified headache type    ED Discharge Orders    None       Franchot Heidelberg, PA-C 11/26/18 0011    Sherwood Gambler, MD 11/26/18 1710

## 2018-11-26 LAB — CBG MONITORING, ED: Glucose-Capillary: 194 mg/dL — ABNORMAL HIGH (ref 70–99)

## 2018-11-26 NOTE — Discharge Instructions (Signed)
Continue taking home medications as prescribed.  Follow up closely with your primary care doctor for reevaluation of your symptoms and management of your diabetes.  Take tylenol as needed for headaches.  Make sure you stay well hydrated.  You are being tested for covid. If results are positive, you will receive a phone call. If negative, you will not. Either way oyu may check online on MyChart.  Return to the ER with any new, worsening, or concerning symptoms.

## 2018-11-26 NOTE — ED Notes (Signed)
Discharge instructions discussed with pt. Pt verbalized understanding. Pt to follow up with PCP r/t elevated CBG. Pt to wait for COVID results

## 2018-11-27 LAB — NOVEL CORONAVIRUS, NAA (HOSP ORDER, SEND-OUT TO REF LAB; TAT 18-24 HRS): SARS-CoV-2, NAA: NOT DETECTED

## 2018-12-05 ENCOUNTER — Telehealth: Payer: Self-pay

## 2018-12-05 NOTE — Telephone Encounter (Signed)
Called patient to do their pre-visit COVID screening.  Call went to voicemail. Unable to do prescreening.  

## 2018-12-06 ENCOUNTER — Ambulatory Visit (INDEPENDENT_AMBULATORY_CARE_PROVIDER_SITE_OTHER): Payer: Federal, State, Local not specified - PPO | Admitting: Nurse Practitioner

## 2018-12-06 ENCOUNTER — Other Ambulatory Visit: Payer: Self-pay

## 2018-12-06 VITALS — BP 130/81 | HR 94 | Temp 98.1°F | Resp 17 | Wt 182.4 lb

## 2018-12-06 DIAGNOSIS — E1169 Type 2 diabetes mellitus with other specified complication: Secondary | ICD-10-CM

## 2018-12-06 DIAGNOSIS — Z23 Encounter for immunization: Secondary | ICD-10-CM | POA: Diagnosis not present

## 2018-12-06 DIAGNOSIS — E871 Hypo-osmolality and hyponatremia: Secondary | ICD-10-CM

## 2018-12-06 DIAGNOSIS — E782 Mixed hyperlipidemia: Secondary | ICD-10-CM | POA: Diagnosis not present

## 2018-12-06 DIAGNOSIS — E785 Hyperlipidemia, unspecified: Secondary | ICD-10-CM

## 2018-12-06 MED ORDER — DAPAGLIFLOZIN PRO-METFORMIN ER 10-500 MG PO TB24: 1.0000 | ORAL_TABLET | Freq: Every day | ORAL | 0 refills | Status: DC

## 2018-12-06 NOTE — Progress Notes (Signed)
Assessment & Plan:  Morgan [name] was seen today for diabetes, hypertension and hyperlipidemia.  Diagnoses and all orders for this visit:  Type 2 diabetes mellitus with hyperlipidemia ([place]) -     Referral to Nutrition and Diabetes Services -     Hemoglobin A1c -     Dapagliflozin-metFORMIN HCl ER 10-500 MG TB24; Take 1 tablet by mouth daily. Continue blood sugar control as discussed in office today, low carbohydrate diet, and regular physical exercise as tolerated, 150 minutes per week (30 min each day, 5 days per week, or 50 min 3 days per week). Keep blood sugar logs with fasting goal of 90-130 mg/dl, post prandial (after you eat) less than 180.  For Hypoglycemia: BS <60 and Hyperglycemia BS >400; contact the clinic ASAP. Annual eye exams and foot exams are recommended.  Hyponatremia -     CMP14+EGFR  Hyperlipidemia, mixed -     Lipid panel INSTRUCTIONS: Work on a low fat, heart healthy diet and participate in regular aerobic exercise program by working out at least 150 minutes per week; 5 days a week-30 minutes per day. Avoid red meat, fried foods. junk foods, sodas, sugary drinks, unhealthy snacking, alcohol and smoking.  Drink at least 48oz of water per day and monitor your carbohydrate intake daily.   Essential Hypertension Continue all antihypertensives as prescribed.  Remember to bring in your blood pressure log with you for your follow up appointment.  DASH/Mediterranean Diets are healthier choices for HTN.      Patient has been counseled on age-appropriate routine health concerns for screening and prevention. These are reviewed and up-to-date. Referrals have been placed accordingly. Immunizations are up-to-date or declined.    Subjective:   Chief Complaint  Patient presents with  . Diabetes  . Hypertension  . Hyperlipidemia   HPI Morgan [name] 53 y.o. female presents to office today for follow up.  has a past medical history of Anxiety, Bipolar disorder ([place]), Breast  mass, Cervical dysplasia, Diabetes mellitus without complication ([place]), Endometriosis, GERD (gastroesophageal reflux disease), Hypercholesteremia, Hypertension, Migraine, Pelvic kidney, and Postpartum depression.   Hypertension She is not exercising and is not adherent to low salt diet.  She does not have a blood pressure log today but states BP normal at home.  Blood pressure is well controlled today. Denies chest pain, shortness of breath, palpitations, lightheadedness, dizziness, headaches or BLE edema.  BP Readings from Last 3 Encounters:  12/06/18 130/81  11/26/18 129/77  11/13/18 96/77    Hyperlipidemia Patient presents for follow up to hyperlipidemia.  She is medication compliant. She is not diet compliant and denies statin intolerance including myalgias.  LDL not at goal <70 Lab Results  Component Value Date   CHOL 221 (H) 08/25/2018   Lab Results  Component Value Date   HDL 41 08/25/2018   Lab Results  Component Value Date   LDLCALC 140 (H) 08/25/2018   Lab Results  Component Value Date   TRIG 202 (H) 08/25/2018   Lab Results  Component Value Date   CHOLHDL 5.4 (H) 08/25/2018     Diabetes Mellitus Type II Current symptoms/problems include none. Current diabetic medications include: Januvia 100 mg daily which patient states she can not afford due to [facility] costs. METFORMIN 1000 mg BID caused diarrhea.  Eye exam current (within one year): yes Weight trend: decreasing steadily Prior visit with dietician: no Current monitoring regimen: home blood tests - fasting averages 200-300s. She has a lack of knowledge with basic dietary diabetes management.  Any episodes of hypoglycemia? no Is She on ACE inhibitor or angiotensin II receptor blocker?  Yes  Lab Results  Component Value Date   HGBA1C 7.4 (H) 08/25/2018   HGBA1C 7.6 (H) 06/16/2017   HGBA1C 5.2 10/11/2011       Review of Systems  Constitutional: Negative for fever, malaise/fatigue and weight loss.  HENT:  Negative.  Negative for nosebleeds.   Eyes: Negative.  Negative for blurred vision, double vision and photophobia.  Respiratory: Negative.  Negative for cough and shortness of breath.   Cardiovascular: Negative.  Negative for chest pain, palpitations and leg swelling.  Gastrointestinal: Negative.  Negative for heartburn, nausea and vomiting.  Musculoskeletal: Negative.  Negative for myalgias.  Neurological: Negative.  Negative for dizziness, focal weakness, seizures and headaches.  Psychiatric/Behavioral: Negative.  Negative for suicidal ideas.    Past Medical History:  Diagnosis Date  . Anxiety   . Bipolar disorder ([place])   . Breast mass    right  . Cervical dysplasia   . Diabetes mellitus without complication ([place])   . Endometriosis   . GERD (gastroesophageal reflux disease)   . Hypercholesteremia   . Hypertension   . Migraine   . Pelvic kidney    left  . Postpartum depression    hx of    Past Surgical History:  Procedure Laterality Date  . BACK SURGERY  2014  . BREAST LUMPECTOMY WITH RADIOACTIVE SEED LOCALIZATION Right 10/28/2014   Procedure: RIGHT BREAST LUMPECTOMY WITH RADIOACTIVE SEED LOCALIZATION;  Surgeon: [name], MD;  Location: [place];  Service: General;  Laterality: Right;  . CESAREAN SECTION  03  . COLONOSCOPY WITH PROPOFOL N/A 05/01/2018   Procedure: COLONOSCOPY WITH PROPOFOL;  Surgeon: [name], MD;  Location: [facility] ENDOSCOPY;  Service: Gastroenterology;  Laterality: N/A;  . COLPOSCOPY    . ESOPHAGOGASTRODUODENOSCOPY (EGD) WITH PROPOFOL N/A 05/01/2018   Procedure: ESOPHAGOGASTRODUODENOSCOPY (EGD) WITH PROPOFOL;  Surgeon: [name], MD;  Location: [facility] ENDOSCOPY;  Service: Gastroenterology;  Laterality: N/A;  . ESOPHAGOGASTRODUODENOSCOPY (EGD) WITH PROPOFOL N/A 11/13/2018   Procedure: ESOPHAGOGASTRODUODENOSCOPY (EGD) WITH PROPOFOL;  Surgeon: [name], MD;  Location: [facility] ENDOSCOPY;  Service: Gastroenterology;  Laterality: N/A;  .  GYNECOLOGIC CRYOSURGERY    . NECK SURGERY  2012  . PELVIC LAPAROSCOPY  54    Family History  Problem Relation Age of Onset  . Diabetes Mother   . Breast cancer Mother 75  . Thyroid disease Mother   . Depression Mother   . Hypertension Father   . Heart disease Father   . Heart attack Father 93       Two instances, first at age 67  . Stroke Father   . Alcohol abuse Father   . Throat cancer Father   . Diabetes Maternal Grandmother   . Depression Maternal Grandmother   . Uterine cancer Maternal Grandmother     Social History Reviewed with no changes to be made today.   Outpatient Medications Prior to Visit  Medication Sig Dispense Refill  . ARIPiprazole (ABILIFY) 15 MG tablet Take 1 tablet by mouth at bedtime.    Marland Kitchen atorvastatin (LIPITOR) 40 MG tablet Take 1 tablet (40 mg total) by mouth daily. 90 tablet 3  . DULoxetine (CYMBALTA) 60 MG capsule Take 60 mg by mouth at bedtime.  5  . losartan (COZAAR) 100 MG tablet Take 1 tablet daily by mouth. 30 tablet 5  . omeprazole (PRILOSEC) 20 MG capsule Take 20 mg by mouth at bedtime.     Marland Kitchen  diclofenac sodium (VOLTAREN) 1 % GEL Apply 4 g topically 4 (four) times daily. (Patient not taking: Reported on 11/13/2018) 100 g 0  . doxycycline (VIBRAMYCIN) 100 MG capsule Take 1 capsule (100 mg total) by mouth 2 (two) times daily. (Patient not taking: Reported on 11/13/2018) 20 capsule 0  . HYDROcodone-acetaminophen (NORCO/VICODIN) 5-325 MG tablet Take 1-2 tablets by mouth every 6 (six) hours as needed for severe pain. 10 tablet 0  . lidocaine (LIDODERM) 5 % Place 1 patch onto the skin daily. Remove & Discard patch within 12 hours or as directed by MD (Patient taking differently: Place 1 patch onto the skin daily as needed (pain). Remove & Discard patch within 12 hours or as directed by MD) 30 patch 0  . methocarbamol (ROBAXIN) 500 MG tablet Take 1 tablet (500 mg total) by mouth 2 (two) times daily. (Patient not taking: Reported on 11/13/2018) 20 tablet 0  .  sitaGLIPtin (JANUVIA) 100 MG tablet Take 1 tablet (100 mg total) by mouth daily. 30 tablet 11   No facility-administered medications prior to visit.     Allergies  Allergen Reactions  . Lamotrigine Rash       Objective:    BP 130/81   Pulse 94   Temp 98.1 F (36.7 C) (Temporal)   Resp 17   Wt 182 lb 6.4 oz (82.7 kg)   LMP 05/12/2011 Comment: no period in 7 years   SpO2 96%   BMI 34.46 kg/m  Wt Readings from Last 3 Encounters:  12/06/18 182 lb 6.4 oz (82.7 kg)  11/25/18 190 lb (86.2 kg)  11/13/18 190 lb (86.2 kg)    Physical Exam Vitals signs and nursing note reviewed.  Constitutional:      Appearance: She is well-developed.  HENT:     Head: Normocephalic and atraumatic.  Neck:     Musculoskeletal: Normal range of motion.  Cardiovascular:     Rate and Rhythm: Normal rate and regular rhythm.     Heart sounds: Normal heart sounds. No murmur. No friction rub. No gallop.   Pulmonary:     Effort: Pulmonary effort is normal. No tachypnea or respiratory distress.     Breath sounds: Normal breath sounds. No decreased breath sounds, wheezing, rhonchi or rales.  Chest:     Chest wall: No tenderness.  Abdominal:     General: Bowel sounds are normal.     Palpations: Abdomen is soft.  Musculoskeletal: Normal range of motion.  Skin:    General: Skin is warm and dry.  Neurological:     Mental Status: She is alert and oriented to person, place, and time.     Coordination: Coordination normal.  Psychiatric:        Behavior: Behavior normal. Behavior is cooperative.        Thought Content: Thought content normal.        Judgment: Judgment normal.          Patient has been counseled extensively about nutrition and exercise as well as the importance of adherence with medications and regular follow-up. The patient was given clear instructions to go to ER or return to medical center if symptoms don't improve, worsen or new problems develop. The patient verbalized  understanding.   Follow-up: Return in about 3 months (around 03/08/2019).   Gildardo Pounds, FNP-BC [facility] and [place], [place]   12/06/2018, 2:38 PM

## 2018-12-06 NOTE — Addendum Note (Signed)
Addended by: Carylon Perches on: 12/06/2018 03:40 PM   Modules accepted: Orders

## 2018-12-06 NOTE — Progress Notes (Signed)
States that BP readings have been "fine". Denies chest pain, SHOB, headaches, palpitations, lower extremity swelling  States that fasting readings have been between 250-300. Has c/o polyuria, polydipsia. Denies nausea, vomiting, vision changes, numbness/tingling in feet.

## 2018-12-07 LAB — CMP14+EGFR
ALT: 52 IU/L — ABNORMAL HIGH (ref 0–32)
AST: 62 IU/L — ABNORMAL HIGH (ref 0–40)
Albumin/Globulin Ratio: 1.6 (ref 1.2–2.2)
Albumin: 4.4 g/dL (ref 3.8–4.9)
Alkaline Phosphatase: 139 IU/L — ABNORMAL HIGH (ref 39–117)
BUN/Creatinine Ratio: 11 (ref 9–23)
BUN: 8 mg/dL (ref 6–24)
Bilirubin Total: 0.6 mg/dL (ref 0.0–1.2)
CO2: 25 mmol/L (ref 20–29)
Calcium: 9.8 mg/dL (ref 8.7–10.2)
Chloride: 97 mmol/L (ref 96–106)
Creatinine, Ser: 0.73 mg/dL (ref 0.57–1.00)
GFR calc Af Amer: 110 mL/min/{1.73_m2} (ref >59)
GFR calc non Af Amer: 96 mL/min/{1.73_m2} (ref >59)
Globulin, Total: 2.7 g/dL (ref 1.5–4.5)
Glucose: 274 mg/dL — ABNORMAL HIGH (ref 65–99)
Potassium: 4.6 mmol/L (ref 3.5–5.2)
Sodium: 136 mmol/L (ref 134–144)
Total Protein: 7.1 g/dL (ref 6.0–8.5)

## 2018-12-07 LAB — LIPID PANEL
Chol/HDL Ratio: 3.8 ratio (ref 0.0–4.4)
Cholesterol, Total: 150 mg/dL (ref 100–199)
HDL: 40 mg/dL (ref >39)
LDL Chol Calc (NIH): 80 mg/dL (ref 0–99)
Triglycerides: 176 mg/dL — ABNORMAL HIGH (ref 0–149)
VLDL Cholesterol Cal: 30 mg/dL (ref 5–40)

## 2018-12-07 LAB — HEMOGLOBIN A1C
Est. average glucose Bld gHb Est-mCnc: 252 mg/dL
Hgb A1c MFr Bld: 10.4 % — ABNORMAL HIGH (ref 4.8–5.6)

## 2018-12-09 ENCOUNTER — Other Ambulatory Visit: Payer: Self-pay | Admitting: Nurse Practitioner

## 2018-12-09 MED ORDER — OZEMPIC (1 MG/DOSE) 2 MG/1.5ML ~~LOC~~ SOPN: PEN_INJECTOR | SUBCUTANEOUS | 0 refills | Status: AC

## 2018-12-09 MED ORDER — BD PEN NEEDLE MINI U/F 31G X 5 MM MISC: 6 refills | Status: DC

## 2018-12-12 ENCOUNTER — Encounter: Payer: Self-pay | Admitting: Nurse Practitioner

## 2018-12-12 ENCOUNTER — Telehealth: Payer: Self-pay | Admitting: Family Medicine

## 2018-12-12 ENCOUNTER — Other Ambulatory Visit: Payer: Self-pay | Admitting: Nurse Practitioner

## 2018-12-12 MED ORDER — INSULIN ASPART 100 UNIT/ML ~~LOC~~ SOLN: 10.0000 [IU] | Freq: Three times a day (TID) | SUBCUTANEOUS | 2 refills | Status: DC

## 2018-12-12 MED ORDER — INSULIN SYRINGES (DISPOSABLE) U-100 1 ML MISC: 3 refills | Status: DC

## 2018-12-12 NOTE — Telephone Encounter (Signed)
Insulin has been sent to the pharmacy. If she needs help with administering the pharmacy can show her how.  If she continues with high readings after starting insulin she needs to go to the emergency room.

## 2018-12-12 NOTE — Telephone Encounter (Signed)
Pt called in regards to her blood sugar being over 400 all day, she took her medication today at 11:00 am and saw no change after. Please advise

## 2018-12-12 NOTE — Telephone Encounter (Signed)
Please advise 

## 2018-12-13 ENCOUNTER — Other Ambulatory Visit: Payer: Self-pay | Admitting: Nurse Practitioner

## 2018-12-13 MED ORDER — INSULIN ASPART 100 UNIT/ML ~~LOC~~ SOLN: 5.0000 [IU] | Freq: Three times a day (TID) | SUBCUTANEOUS | 2 refills | Status: DC

## 2018-12-13 NOTE — Telephone Encounter (Signed)
Patient aware.

## 2019-01-04 ENCOUNTER — Encounter: Payer: Self-pay | Admitting: Family Medicine

## 2019-01-24 ENCOUNTER — Other Ambulatory Visit: Payer: Self-pay

## 2019-01-24 ENCOUNTER — Encounter (HOSPITAL_COMMUNITY): Payer: Self-pay | Admitting: Emergency Medicine

## 2019-01-24 ENCOUNTER — Emergency Department (HOSPITAL_COMMUNITY): Payer: Federal, State, Local not specified - PPO

## 2019-01-24 ENCOUNTER — Ambulatory Visit
Admission: EM | Admit: 2019-01-24 | Discharge: 2019-01-24 | Disposition: A | Discharge status: Home / Self Care | Payer: Federal, State, Local not specified - PPO | Source: Home / Self Care

## 2019-01-24 ENCOUNTER — Emergency Department (HOSPITAL_COMMUNITY)
Admission: EM | Admit: 2019-01-24 | Discharge: 2019-01-25 | Disposition: A | Discharge status: Home / Self Care | Payer: Federal, State, Local not specified - PPO | Attending: Emergency Medicine | Admitting: Emergency Medicine

## 2019-01-24 DIAGNOSIS — R0789 Other chest pain: Secondary | ICD-10-CM | POA: Diagnosis not present

## 2019-01-24 DIAGNOSIS — R0602 Shortness of breath: Secondary | ICD-10-CM | POA: Insufficient documentation

## 2019-01-24 DIAGNOSIS — T368X5A Adverse effect of other systemic antibiotics, initial encounter: Secondary | ICD-10-CM | POA: Diagnosis not present

## 2019-01-24 DIAGNOSIS — Z5321 Procedure and treatment not carried out due to patient leaving prior to being seen by health care provider: Secondary | ICD-10-CM | POA: Diagnosis not present

## 2019-01-24 DIAGNOSIS — T3695XA Adverse effect of unspecified systemic antibiotic, initial encounter: Secondary | ICD-10-CM

## 2019-01-24 LAB — CBC
HCT: 41.2 % (ref 36.0–46.0)
Hemoglobin: 13.2 g/dL (ref 12.0–15.0)
MCH: 26.2 pg (ref 26.0–34.0)
MCHC: 32 g/dL (ref 30.0–36.0)
MCV: 81.7 fL (ref 80.0–100.0)
Platelets: 222 10*3/uL (ref 150–400)
RBC: 5.04 MIL/uL (ref 3.87–5.11)
RDW: 13.4 % (ref 11.5–15.5)
WBC: 6.3 10*3/uL (ref 4.0–10.5)
nRBC: 0 % (ref 0.0–0.2)

## 2019-01-24 LAB — TROPONIN I (HIGH SENSITIVITY)
Troponin I (High Sensitivity): <2 ng/L (ref <18)
Troponin I (High Sensitivity): <2 ng/L (ref <18)

## 2019-01-24 LAB — BASIC METABOLIC PANEL
Anion gap: 10 (ref 5–15)
BUN: 8 mg/dL (ref 6–20)
CO2: 25 mmol/L (ref 22–32)
Calcium: 9.4 mg/dL (ref 8.9–10.3)
Chloride: 104 mmol/L (ref 98–111)
Creatinine, Ser: 0.85 mg/dL (ref 0.44–1.00)
GFR calc Af Amer: >60 mL/min (ref >60)
GFR calc non Af Amer: >60 mL/min (ref >60)
Glucose, Bld: 181 mg/dL — ABNORMAL HIGH (ref 70–99)
Potassium: 3.7 mmol/L (ref 3.5–5.1)
Sodium: 139 mmol/L (ref 135–145)

## 2019-01-24 LAB — I-STAT BETA HCG BLOOD, ED (MC, WL, AP ONLY): I-stat hCG, quantitative: <5 m[IU]/mL (ref <5)

## 2019-01-24 MED ORDER — PREDNISONE 10 MG (21) PO TBPK: ORAL_TABLET | Freq: Every day | ORAL | 0 refills | Status: DC

## 2019-01-24 MED ORDER — METHYLPREDNISOLONE SODIUM SUCC 125 MG IJ SOLR
125.0000 mg | Freq: Once | INTRAMUSCULAR | Status: AC
  Administered 2019-01-24: 08:00:00 125 mg via INTRAMUSCULAR

## 2019-01-24 MED ORDER — SODIUM CHLORIDE 0.9% FLUSH: 3.0000 mL | Freq: Once | INTRAVENOUS | Status: DC

## 2019-01-24 NOTE — ED Provider Notes (Signed)
[facility]    CSN: [id number] Arrival date & time: 01/24/19  0801      History   Chief Complaint Chief Complaint  Patient presents with  . Urticaria    HPI Morgan [name] is a 53 y.o. female history of diabetes, hypertension, BPD presenting for adverse drug reaction.  States she started clindamycin yesterday as per her dentist status post tooth extraction.  Last dose at 2 AM yesterday morning.  Patient woke up yesterday morning around 8 AM with a rash over her chest, abdomen, arms, neck.  States that it is itchy, uncomfortable.  States her throat felt a little tight yesterday, though this is alleviated with Benadryl.  Feels that since rash onset it has been stable.  She has been without chest pain, lightheadedness, vomiting.   Past Medical History:  Diagnosis Date  . Anxiety   . Bipolar disorder ([place])   . Breast mass    right  . Cervical dysplasia   . Diabetes mellitus without complication ([place])   . Endometriosis   . GERD (gastroesophageal reflux disease)   . Hypercholesteremia   . Hypertension   . Migraine   . Pelvic kidney    left  . Postpartum depression    hx of    Patient Active Problem List   Diagnosis Date Noted  . Chest pain 06/14/2017  . Hypertension complicating diabetes ([place]) 06/14/2017  . Type 2 diabetes mellitus with hyperlipidemia ([place]) 06/14/2017  . Hyperlipidemia associated with type 2 diabetes mellitus ([place]) 06/14/2017  . Cervical dysplasia   . Bipolar disorder [facility])     Past Surgical History:  Procedure Laterality Date  . BACK SURGERY  2014  . BREAST LUMPECTOMY WITH RADIOACTIVE SEED LOCALIZATION Right 10/28/2014   Procedure: RIGHT BREAST LUMPECTOMY WITH RADIOACTIVE SEED LOCALIZATION;  Surgeon: [name], MD;  Location: [place];  Service: General;  Laterality: Right;  . CESAREAN SECTION  03  . COLONOSCOPY WITH PROPOFOL N/A 05/01/2018   Procedure: COLONOSCOPY WITH PROPOFOL;  Surgeon: [name], MD;   Location: [facility] ENDOSCOPY;  Service: Gastroenterology;  Laterality: N/A;  . COLPOSCOPY    . ESOPHAGOGASTRODUODENOSCOPY (EGD) WITH PROPOFOL N/A 05/01/2018   Procedure: ESOPHAGOGASTRODUODENOSCOPY (EGD) WITH PROPOFOL;  Surgeon: [name], MD;  Location: [facility] ENDOSCOPY;  Service: Gastroenterology;  Laterality: N/A;  . ESOPHAGOGASTRODUODENOSCOPY (EGD) WITH PROPOFOL N/A 11/13/2018   Procedure: ESOPHAGOGASTRODUODENOSCOPY (EGD) WITH PROPOFOL;  Surgeon: [name], MD;  Location: [facility] ENDOSCOPY;  Service: Gastroenterology;  Laterality: N/A;  . GYNECOLOGIC CRYOSURGERY    . NECK SURGERY  2012  . PELVIC LAPAROSCOPY  67    OB History    Gravida  1   Para  1   Term  1   Preterm      AB      Living  1     SAB      TAB      Ectopic      Multiple      Live Births               Home Medications    Prior to Admission medications   Medication Sig Start Date End Date Taking? Authorizing Provider  ARIPiprazole (ABILIFY) 15 MG tablet Take 1 tablet by mouth at bedtime. 12/21/17   Noemi Chapel, NP  atorvastatin (LIPITOR) 40 MG tablet Take 1 tablet (40 mg total) by mouth daily. 08/28/18   Scot Jun, FNP  Dapagliflozin-metFORMIN HCl ER 10-500 MG TB24 Take 1 tablet by mouth daily.  12/06/18 03/06/19  Gildardo Pounds, NP  DULoxetine (CYMBALTA) 60 MG capsule Take 60 mg by mouth at bedtime. 11/04/16   [provider]  insulin aspart (NOVOLOG) 100 UNIT/ML injection Inject 5 Units into the skin 3 (three) times daily before meals. 12/13/18 03/13/19  Gildardo Pounds, NP  Insulin Syringes, Disposable, U-100 1 ML MISC Use as instructed. Inject into the skin three times per day 12/12/18   Gildardo Pounds, NP  losartan (COZAAR) 100 MG tablet Take 1 tablet daily by mouth. 08/24/18   Scot Jun, FNP  omeprazole (PRILOSEC) 20 MG capsule Take 20 mg by mouth at bedtime.     [provider]  predniSONE (STERAPRED UNI-PAK 21 TAB) 10 MG (21) TBPK tablet Take by mouth daily. Take  steroid taper as written 01/24/19   Hall-Potvin, [place], PA-C  Semaglutide, 1 MG/DOSE, (OZEMPIC, 1 MG/DOSE,) 2 MG/1.5ML SOPN Inject 0.25 mg into the skin once a week for 7 days, THEN 0.5 mg once a week for 7 days, THEN 1 mg once a week. 12/09/18 03/23/19  Gildardo Pounds, NP    Family History Family History  Problem Relation Age of Onset  . Diabetes Mother   . Breast cancer Mother 2  . Thyroid disease Mother   . Depression Mother   . Hypertension Father   . Heart disease Father   . Heart attack Father 63       Two instances, first at age 73  . Stroke Father   . Alcohol abuse Father   . Throat cancer Father   . Diabetes Maternal Grandmother   . Depression Maternal Grandmother   . Uterine cancer Maternal Grandmother     Social History Social History   Tobacco Use  . Smoking status: Never Smoker  . Smokeless tobacco: Never Used  Substance Use Topics  . Alcohol use: No  . Drug use: No     Allergies   Clindamycin/lincomycin and Lamotrigine   Review of Systems Review of Systems  Constitutional: Negative for activity change, appetite change, fatigue and fever.  HENT: Negative for ear pain, sinus pain, sore throat, trouble swallowing and voice change.   Eyes: Negative for pain, redness and visual disturbance.  Respiratory: Negative for cough and shortness of breath.   Cardiovascular: Negative for chest pain and palpitations.  Gastrointestinal: Negative for abdominal pain, diarrhea and vomiting.  Musculoskeletal: Negative for arthralgias and myalgias.  Skin: Positive for rash. Negative for wound.  Neurological: Negative for syncope and headaches.     Physical Exam Triage Vital Signs ED Triage Vitals  Enc Vitals Group     BP      Pulse      Resp      Temp      Temp src      SpO2      Weight      Height      Head Circumference      Peak Flow      Pain Score      Pain Loc      Pain Edu?      Excl. in [place]?    No data found.  Updated Vital Signs BP 131/67  (BP Location: Left Arm)   Pulse (!) 109   Temp 97.7 F (36.5 C) (Oral)   Resp 18   LMP 05/12/2011 Comment: no period in 7 years   SpO2 98%   Visual Acuity Right Eye Distance:   Left Eye Distance:   Bilateral Distance:  Right Eye Near:   Left Eye Near:    Bilateral Near:     Physical Exam Constitutional:      General: She is not in acute distress.    Appearance: She is normal weight. She is not toxic-appearing or diaphoretic.  HENT:     Head: Normocephalic and atraumatic.     Mouth/Throat:     Mouth: Mucous membranes are moist.     Pharynx: Oropharynx is clear.     Comments: Uvula midline, nonedematous. Eyes:     General: No scleral icterus.       Right eye: No discharge.        Left eye: No discharge.     Conjunctiva/sclera: Conjunctivae normal.     Pupils: Pupils are equal, round, and reactive to light.  Neck:     Musculoskeletal: Normal range of motion and neck supple.  Cardiovascular:     Rate and Rhythm: Regular rhythm. Tachycardia present.     Heart sounds: No murmur. No gallop.   Pulmonary:     Effort: Pulmonary effort is normal. No respiratory distress.     Breath sounds: No stridor. No wheezing, rhonchi or rales.     Comments: Good air entry bilaterally without prolonged expiratory phase Lymphadenopathy:     Cervical: No cervical adenopathy.  Skin:    Capillary Refill: Capillary refill takes more than 3 seconds.     Coloration: Skin is not jaundiced.     Findings: No bruising.     Comments: Patient with slight pallor in face, distal upper extremities.  No mottling.  Patient has diffuse flat erythematous rash over neck, chest, abdomen extending to inferior aspect of upper extremities bilaterally.  Rash spares back.  Lower extremities deferred.  No open wounds, discharge, warmth.  Neurological:     General: No focal deficit present.     Mental Status: She is alert and oriented to person, place, and time.     Motor: No weakness.     Gait: Gait normal.      Deep Tendon Reflexes: Reflexes normal.  Psychiatric:        Mood and Affect: Mood normal.        Behavior: Behavior normal.        Thought Content: Thought content normal.        Judgment: Judgment normal.      UC Treatments / Results  Labs (all labs ordered are listed, but only abnormal results are displayed) Labs Reviewed - No data to display  EKG   Radiology No results found.  Procedures Procedures (including critical care time)  Medications Ordered in UC Medications  methylPREDNISolone sodium succinate (SOLU-MEDROL) 125 mg/2 mL injection 125 mg (125 mg Intramuscular Given 01/24/19 0828)    Initial Impression / Assessment and Plan / UC Course  I have reviewed the triage vital signs and the nursing notes.  Pertinent labs & imaging results that were available during my care of the patient were reviewed by me and considered in my medical decision making (see chart for details).     Patient with drug eruption rash status post clindamycin use x3 doses.  Patient does have slight pallor, decreased perfusion of distal upper extremities with mild tachycardia, though feels stable since symptom onset greater than 24 hours.  Patient given IM Solu-Medrol in office which she tolerated well.  Will start prednisone taper pack today.  Strict ER return precautions were discussed.  Should patient do well with prednisone, she is to keep her dentist appointment  tomorrow.  Patient verbalized understanding and is agreeable to plan. Final Clinical Impressions(s) / UC Diagnoses   Final diagnoses:  Allergic reaction due to antibacterial drug     Discharge Instructions     Take steroid taper as prescribed. May take Benadryl at nighttime for additional relief. Do not take clindamycin again: Your allergy list has been updated in the [facility] health system. Please keep follow-up appointment with dentist tomorrow. Go to ER for persistent, worsening rash, development of shortness of breath, choking  sensation, lightheadedness, chest pain.    ED Prescriptions    Medication Sig Dispense Auth. Provider   predniSONE (STERAPRED UNI-PAK 21 TAB) 10 MG (21) TBPK tablet Take by mouth daily. Take steroid taper as written 21 tablet Hall-Potvin, [place], PA-C     PDMP not reviewed this encounter.   Hall-Potvin, [place], [place] 01/24/19 [id number]

## 2019-01-24 NOTE — Discharge Instructions (Addendum)
Take steroid taper as prescribed. May take Benadryl at nighttime for additional relief. Do not take clindamycin again: Your allergy list has been updated in the [facility] health system. Please keep follow-up appointment with dentist tomorrow. Go to ER for persistent, worsening rash, development of shortness of breath, choking sensation, lightheadedness, chest pain.

## 2019-01-24 NOTE — ED Triage Notes (Signed)
Pt states has had 3 doses of clindamycin as of yesterday and developed hives yesterday. Last benadryl at 4am this morning.

## 2019-01-24 NOTE — ED Notes (Signed)
Recheck HR in about 15 min please

## 2019-01-24 NOTE — ED Triage Notes (Signed)
Pt states she had an allergic reaction to Clindamycin since yesterday.  Seen at [facility] this morning and given steroid shot.  States she was told to come to ED if not feeling better in a few hours.  Reports pain across chest and SOB x 2 hours.

## 2019-01-25 NOTE — ED Notes (Signed)
Pts name called x2 with no answer.

## 2019-01-25 NOTE — ED Notes (Addendum)
No call for VS x1.

## 2019-02-16 HISTORY — PX: BREAST EXCISIONAL BIOPSY: SUR124

## 2019-03-04 ENCOUNTER — Other Ambulatory Visit: Payer: Self-pay | Admitting: Nurse Practitioner

## 2019-03-04 DIAGNOSIS — E1169 Type 2 diabetes mellitus with other specified complication: Secondary | ICD-10-CM

## 2019-03-09 ENCOUNTER — Other Ambulatory Visit: Payer: Self-pay | Admitting: Radiology

## 2019-03-09 DIAGNOSIS — N200 Calculus of kidney: Secondary | ICD-10-CM

## 2019-03-16 DIAGNOSIS — M545 Low back pain, unspecified: Secondary | ICD-10-CM | POA: Insufficient documentation

## 2019-03-16 DIAGNOSIS — G8929 Other chronic pain: Secondary | ICD-10-CM | POA: Insufficient documentation

## 2019-03-16 DIAGNOSIS — M5441 Lumbago with sciatica, right side: Secondary | ICD-10-CM | POA: Insufficient documentation

## 2019-03-16 DIAGNOSIS — G894 Chronic pain syndrome: Secondary | ICD-10-CM | POA: Insufficient documentation

## 2019-03-16 DIAGNOSIS — M5136 Other intervertebral disc degeneration, lumbar region: Secondary | ICD-10-CM | POA: Insufficient documentation

## 2019-03-26 ENCOUNTER — Ambulatory Visit: Payer: Federal, State, Local not specified - PPO | Admitting: Urology

## 2019-03-28 ENCOUNTER — Ambulatory Visit: Payer: Self-pay | Admitting: Urology

## 2019-04-18 DIAGNOSIS — E7800 Pure hypercholesterolemia, unspecified: Secondary | ICD-10-CM | POA: Insufficient documentation

## 2019-04-18 DIAGNOSIS — N879 Dysplasia of cervix uteri, unspecified: Secondary | ICD-10-CM | POA: Insufficient documentation

## 2019-04-18 DIAGNOSIS — IMO0002 Reserved for concepts with insufficient information to code with codable children: Secondary | ICD-10-CM | POA: Insufficient documentation

## 2019-05-09 ENCOUNTER — Other Ambulatory Visit: Payer: Self-pay | Admitting: General Surgery

## 2019-05-09 ENCOUNTER — Ambulatory Visit: Payer: Self-pay | Admitting: General Surgery

## 2019-05-09 DIAGNOSIS — N6021 Fibroadenosis of right breast: Secondary | ICD-10-CM

## 2019-05-11 DIAGNOSIS — Z6833 Body mass index (BMI) 33.0-33.9, adult: Secondary | ICD-10-CM | POA: Insufficient documentation

## 2019-05-12 ENCOUNTER — Encounter (HOSPITAL_COMMUNITY): Payer: Self-pay | Admitting: Emergency Medicine

## 2019-05-12 ENCOUNTER — Emergency Department (HOSPITAL_COMMUNITY): Payer: Federal, State, Local not specified - PPO

## 2019-05-12 ENCOUNTER — Emergency Department (HOSPITAL_COMMUNITY)
Admission: EM | Admit: 2019-05-12 | Discharge: 2019-05-12 | Disposition: A | Discharge status: Home / Self Care | Payer: Federal, State, Local not specified - PPO | Attending: Emergency Medicine | Admitting: Emergency Medicine

## 2019-05-12 ENCOUNTER — Other Ambulatory Visit: Payer: Self-pay

## 2019-05-12 DIAGNOSIS — R1031 Right lower quadrant pain: Secondary | ICD-10-CM | POA: Diagnosis present

## 2019-05-12 DIAGNOSIS — I10 Essential (primary) hypertension: Secondary | ICD-10-CM | POA: Insufficient documentation

## 2019-05-12 DIAGNOSIS — Z794 Long term (current) use of insulin: Secondary | ICD-10-CM | POA: Diagnosis not present

## 2019-05-12 DIAGNOSIS — N939 Abnormal uterine and vaginal bleeding, unspecified: Secondary | ICD-10-CM

## 2019-05-12 DIAGNOSIS — R109 Unspecified abdominal pain: Secondary | ICD-10-CM | POA: Insufficient documentation

## 2019-05-12 DIAGNOSIS — E119 Type 2 diabetes mellitus without complications: Secondary | ICD-10-CM | POA: Diagnosis not present

## 2019-05-12 DIAGNOSIS — Z79899 Other long term (current) drug therapy: Secondary | ICD-10-CM | POA: Insufficient documentation

## 2019-05-12 LAB — COMPREHENSIVE METABOLIC PANEL
ALT: 24 U/L (ref 0–44)
AST: 26 U/L (ref 15–41)
Albumin: 3.6 g/dL (ref 3.5–5.0)
Alkaline Phosphatase: 102 U/L (ref 38–126)
Anion gap: 8 (ref 5–15)
BUN: 8 mg/dL (ref 6–20)
CO2: 27 mmol/L (ref 22–32)
Calcium: 9.9 mg/dL (ref 8.9–10.3)
Chloride: 104 mmol/L (ref 98–111)
Creatinine, Ser: 0.65 mg/dL (ref 0.44–1.00)
GFR calc Af Amer: >60 mL/min (ref >60)
GFR calc non Af Amer: >60 mL/min (ref >60)
Glucose, Bld: 162 mg/dL — ABNORMAL HIGH (ref 70–99)
Potassium: 3.9 mmol/L (ref 3.5–5.1)
Sodium: 139 mmol/L (ref 135–145)
Total Bilirubin: 0.8 mg/dL (ref 0.3–1.2)
Total Protein: 6.8 g/dL (ref 6.5–8.1)

## 2019-05-12 LAB — URINALYSIS, ROUTINE W REFLEX MICROSCOPIC
Bacteria, UA: NONE SEEN
Bilirubin Urine: NEGATIVE
Glucose, UA: >=500 mg/dL — AB
Ketones, ur: NEGATIVE mg/dL
Leukocytes,Ua: NEGATIVE
Nitrite: NEGATIVE
Protein, ur: NEGATIVE mg/dL
Specific Gravity, Urine: 1.031 — ABNORMAL HIGH (ref 1.005–1.030)
pH: 5 (ref 5.0–8.0)

## 2019-05-12 LAB — CBC
HCT: 40.5 % (ref 36.0–46.0)
Hemoglobin: 12.6 g/dL (ref 12.0–15.0)
MCH: 25.1 pg — ABNORMAL LOW (ref 26.0–34.0)
MCHC: 31.1 g/dL (ref 30.0–36.0)
MCV: 80.7 fL (ref 80.0–100.0)
Platelets: 214 10*3/uL (ref 150–400)
RBC: 5.02 MIL/uL (ref 3.87–5.11)
RDW: 14.6 % (ref 11.5–15.5)
WBC: 7 10*3/uL (ref 4.0–10.5)
nRBC: 0 % (ref 0.0–0.2)

## 2019-05-12 LAB — LIPASE, BLOOD: Lipase: 23 U/L (ref 11–51)

## 2019-05-12 MED ORDER — ONDANSETRON HCL 4 MG/2ML IJ SOLN
4.0000 mg | Freq: Once | INTRAMUSCULAR | Status: AC
  Administered 2019-05-12: 4 mg via INTRAVENOUS
  Filled 2019-05-12: qty 2

## 2019-05-12 MED ORDER — SODIUM CHLORIDE 0.9% FLUSH
3.0000 mL | Freq: Once | INTRAVENOUS | Status: AC
  Administered 2019-05-12: 3 mL via INTRAVENOUS

## 2019-05-12 MED ORDER — MORPHINE SULFATE (PF) 4 MG/ML IV SOLN
4.0000 mg | Freq: Once | INTRAVENOUS | Status: AC
  Administered 2019-05-12: 4 mg via INTRAVENOUS
  Filled 2019-05-12: qty 1

## 2019-05-12 NOTE — ED Notes (Signed)
Patient verbalizes understanding of discharge instructions. Opportunity for questioning and answers were provided. Armband removed by staff, pt discharged from ED to home with friend 

## 2019-05-12 NOTE — ED Provider Notes (Signed)
[facility] Provider Note   CSN: [id number] Arrival date & time: 05/12/19  1507     History Chief Complaint  Patient presents with  . Abdominal Pain  . Vaginal Bleeding    Morgan [name] is a 54 y.o. female with PMH significant for endometriosis, cervical dysplasia, IDDM, HTN, HLD, and kidney stones who presents the ED with a 12-hour history of right-sided flank pain radiating into her RLQ.  She is also endorsing nausea and states that she has noticed bright red blood while wiping.  She reports that she went through menopause at the age of 78 and has not had any menses since.  She was evaluated by her GYN Elon Alas, NP on 04/12/2018 for low back and lower abdominal discomfort and US performed demonstrated thickened endometrium with right-sided ovarian cyst and intramural fibroids.  She denies any recent numbness, fevers or chills, vomiting, diminished appetite, pelvic pain, vaginal pain, abnormal vaginal discharge, pain with defecation, changes in bowel habits, dysuria, increased urinary frequency, lightheadedness, headache, or other symptoms.   HPI     Past Medical History:  Diagnosis Date  . Anxiety   . Bipolar disorder ([place])   . Breast mass    right  . Cervical dysplasia   . Diabetes mellitus without complication ([place])   . Endometriosis   . GERD (gastroesophageal reflux disease)   . Hypercholesteremia   . Hypertension   . Migraine   . Pelvic kidney    left  . Postpartum depression    hx of    Patient Active Problem List   Diagnosis Date Noted  . Chest pain 06/14/2017  . Hypertension complicating diabetes ([place]) 06/14/2017  . Type 2 diabetes mellitus with hyperlipidemia ([place]) 06/14/2017  . Hyperlipidemia associated with type 2 diabetes mellitus ([place]) 06/14/2017  . Cervical dysplasia   . Bipolar disorder [facility])     Past Surgical History:  Procedure Laterality Date  . BACK SURGERY  2014  . BREAST LUMPECTOMY WITH RADIOACTIVE  SEED LOCALIZATION Right 10/28/2014   Procedure: RIGHT BREAST LUMPECTOMY WITH RADIOACTIVE SEED LOCALIZATION;  Surgeon: [name], MD;  Location: [place];  Service: General;  Laterality: Right;  . CESAREAN SECTION  03  . COLONOSCOPY WITH PROPOFOL N/A 05/01/2018   Procedure: COLONOSCOPY WITH PROPOFOL;  Surgeon: [name], MD;  Location: [facility] ENDOSCOPY;  Service: Gastroenterology;  Laterality: N/A;  . COLPOSCOPY    . ESOPHAGOGASTRODUODENOSCOPY (EGD) WITH PROPOFOL N/A 05/01/2018   Procedure: ESOPHAGOGASTRODUODENOSCOPY (EGD) WITH PROPOFOL;  Surgeon: [name], MD;  Location: [facility] ENDOSCOPY;  Service: Gastroenterology;  Laterality: N/A;  . ESOPHAGOGASTRODUODENOSCOPY (EGD) WITH PROPOFOL N/A 11/13/2018   Procedure: ESOPHAGOGASTRODUODENOSCOPY (EGD) WITH PROPOFOL;  Surgeon: [name], MD;  Location: [facility] ENDOSCOPY;  Service: Gastroenterology;  Laterality: N/A;  . GYNECOLOGIC CRYOSURGERY    . NECK SURGERY  2012  . PELVIC LAPAROSCOPY  50     OB History    Gravida  1   Para  1   Term  1   Preterm      AB      Living  1     SAB      TAB      Ectopic      Multiple      Live Births              Family History  Problem Relation Age of Onset  . Diabetes Mother   . Breast cancer Mother 25  . Thyroid disease Mother   .  Depression Mother   . Hypertension Father   . Heart disease Father   . Heart attack Father 71       Two instances, first at age 59  . Stroke Father   . Alcohol abuse Father   . Throat cancer Father   . Diabetes Maternal Grandmother   . Depression Maternal Grandmother   . Uterine cancer Maternal Grandmother     Social History   Tobacco Use  . Smoking status: Never Smoker  . Smokeless tobacco: Never Used  Substance Use Topics  . Alcohol use: No  . Drug use: No    Home Medications Prior to Admission medications   Medication Sig Start Date End Date Taking? Authorizing Provider  ARIPiprazole (ABILIFY) 15 MG tablet Take 1 tablet  by mouth at bedtime. 12/21/17   Noemi Chapel, NP  atorvastatin (LIPITOR) 40 MG tablet Take 1 tablet (40 mg total) by mouth daily. 08/28/18   Scot Jun, FNP  DULoxetine (CYMBALTA) 60 MG capsule Take 60 mg by mouth at bedtime. 11/04/16   [provider]  insulin aspart (NOVOLOG) 100 UNIT/ML injection Inject 5 Units into the skin 3 (three) times daily before meals. 12/13/18 03/13/19  Gildardo Pounds, NP  Insulin Syringes, Disposable, U-100 1 ML MISC Use as instructed. Inject into the skin three times per day 12/12/18   Gildardo Pounds, NP  losartan (COZAAR) 100 MG tablet Take 1 tablet daily by mouth. 08/24/18   Scot Jun, FNP  omeprazole (PRILOSEC) 20 MG capsule Take 20 mg by mouth at bedtime.     [provider]  predniSONE (STERAPRED UNI-PAK 21 TAB) 10 MG (21) TBPK tablet Take by mouth daily. Take steroid taper as written 01/24/19   Hall-Potvin, [place], PA-C  XIGDUO XR 10-500 MG TB24 TAKE 1 TABLET BY MOUTH EVERY DAY 03/05/19   Gildardo Pounds, NP    Allergies    Clindamycin/lincomycin and Lamotrigine  Review of Systems   Review of Systems  Constitutional: Negative for chills and fever.  Gastrointestinal: Positive for abdominal pain and nausea. Negative for vomiting.  Genitourinary: Positive for vaginal bleeding. Negative for pelvic pain, vaginal discharge and vaginal pain.     Physical Exam Updated Vital Signs BP 127/73 (BP Location: Left Arm)   Pulse 81   Temp 98.4 F (36.9 C) (Oral)   Resp 16   LMP 05/12/2011 Comment: no period in 7 years   SpO2 95%   Physical Exam Vitals and nursing note reviewed. Exam conducted with a chaperone present.  Constitutional:      General: She is not in acute distress.    Appearance: Normal appearance. She is not ill-appearing.  HENT:     Head: Normocephalic and atraumatic.  Eyes:     General: No scleral icterus.    Conjunctiva/sclera: Conjunctivae normal.  Cardiovascular:     Rate and Rhythm: Normal rate and  regular rhythm.     Pulses: Normal pulses.     Heart sounds: Normal heart sounds.  Pulmonary:     Effort: Pulmonary effort is normal. No respiratory distress.     Breath sounds: Normal breath sounds.  Abdominal:     Comments: Soft, nondistended.  Mild TTP in RLQ, but most notably over right flank.  No TTP elsewhere.  No overlying skin changes.  No rash.  No guarding.  Normoactive bowel sounds.  Genitourinary:    Comments: Speculum exam: Mild amount of bright red blood in the vaginal vault, appears to be coming from cervical  os.  No significant discharge.  Cervix nonerythematous.  No cervical ectropion. Bimanual exam: No CMT.  No adnexal tenderness.  Normal. Musculoskeletal:     Cervical back: Normal range of motion.  Skin:    General: Skin is dry.     Capillary Refill: Capillary refill takes less than 2 seconds.  Neurological:     Mental Status: She is alert and oriented to person, place, and time.     GCS: GCS eye subscore is 4. GCS verbal subscore is 5. GCS motor subscore is 6.  Psychiatric:        Mood and Affect: Mood normal.        Behavior: Behavior normal.        Thought Content: Thought content normal.      ED Results / Procedures / Treatments   Labs (all labs ordered are listed, but only abnormal results are displayed) Labs Reviewed  COMPREHENSIVE METABOLIC PANEL - Abnormal; Notable for the following components:      Result Value   Glucose, Bld 162 (*)    All other components within normal limits  CBC - Abnormal; Notable for the following components:   MCH 25.1 (*)    All other components within normal limits  URINALYSIS, ROUTINE W REFLEX MICROSCOPIC - Abnormal; Notable for the following components:   Specific Gravity, Urine 1.031 (*)    Glucose, UA >=500 (*)    Hgb urine dipstick SMALL (*)    All other components within normal limits  LIPASE, BLOOD    EKG None  Radiology CT Renal Stone Study  Result Date: 05/12/2019 CLINICAL DATA:  Flank pain on the  right. EXAM: CT ABDOMEN AND PELVIS WITHOUT CONTRAST TECHNIQUE: Multidetector CT imaging of the abdomen and pelvis was performed following the standard protocol without IV contrast. COMPARISON:  CT abdomen pelvis dated 11/25/2018. FINDINGS: Lower chest: No acute abnormality. Hepatobiliary: There is mild hepatic steatosis. The previously documented hepatic meningioma is not well seen on today's noncontrast exam. No gallstones, gallbladder wall thickening, or biliary dilatation. Pancreas: Unremarkable. No pancreatic ductal dilatation or surrounding inflammatory changes. Spleen: Normal in size without focal abnormality. Adrenals/Urinary Tract: Adrenal glands are unremarkable. There is a left pelvic kidney. The right kidney is normal, focal lesion, or hydronephrosis. Nonobstructive renal calculi measure up to 2 mm. Bladder is unremarkable. Stomach/Bowel: Stomach is within normal limits. No pericecal inflammatory changes are noted to suggest acute appendicitis. No evidence of bowel wall thickening, distention, or inflammatory changes. Vascular/Lymphatic: No significant vascular findings are present. No enlarged abdominal or pelvic lymph nodes. Reproductive: Uterus and bilateral adnexa are unremarkable. Other: No abdominal wall hernia or abnormality. No abdominopelvic ascites. Musculoskeletal: No acute or significant osseous findings. Posterior spinal fixation hardware is seen at L4-5. IMPRESSION: 1. No findings to explain the patient's symptoms. Nonobstructive renal calculi in both kidneys. No hydronephrosis. Electronically Signed   By: Zerita Boers M.D.   On: 05/12/2019 17:33    Procedures Procedures (including critical care time)  Medications Ordered in ED Medications  sodium chloride flush (NS) 0.9 % injection 3 mL (3 mLs Intravenous Given 05/12/19 1746)  ondansetron (ZOFRAN) injection 4 mg (4 mg Intravenous Given 05/12/19 1739)  morphine 4 MG/ML injection 4 mg (4 mg Intravenous Given 05/12/19 1740)    ED  Course  I have reviewed the triage vital signs and the nursing notes.  Pertinent labs & imaging results that were available during my care of the patient were reviewed by me and considered in my medical decision making (  see chart for details).    MDM Rules/Calculators/A&P                      Patient's 12-hour history of right-sided flank pain rating into the RLQ was initially concerning for kidney stone, particular given her history of kidney stones.  However, obtained CT renal stone study which was negative for any obstructing kidney stones or other acute intra-abdominal pathology.  After patient was given 4 mg morphine IV, patient reports that her pain symptoms have improved almost entirely, from a 7 out of 10 to a 1 out of 10.  Her lab work demonstrates glucose urea and small amount of hemoglobin in urine, but otherwise is unremarkable.  Noticed small amount of blood in vaginal vault on speculum exam, appears to be coming from cervical os.  Given that patient went through menopause nearly 10 years ago, in conjunction with her history of endometriosis and cervical dysplasia, strongly encouraged her to follow-up with her OB/GYN soon as possible to assess for possible uterine malignancy.  Also discussed possibility of simple intravaginal tear subsequent to atrophic vaginitis, however did not visualize any clear evidence of my exam.  She will follow up with her OB/GYN on Monday, 05/14/2019.  As for abdominal discomfort, her comprehensive work-up today was reassuring.  No reported pelvic discomfort or tenderness on bimanual exam.  Her CMP and CBC were reassuring.  Lipase was normal at 23.  Vital signs have been stable and within normal limits and the entire duration of her stay here in the ED.  Patient declined any Zofran ODT to go home with.  Strict ED return precautions discussed with the patient.  All of the evaluation and work-up results were discussed with the patient and any family at bedside.  They were provided opportunity to ask any additional questions and have none at this time. They have expressed understanding of verbal discharge instructions as well as return precautions and are agreeable to the plan.    Final Clinical Impression(s) / ED Diagnoses Final diagnoses:  Vaginal bleeding  Right flank pain    Rx / DC Orders ED Discharge Orders    None       Reita Chard 05/12/19 2014    Pattricia Boss, MD 05/12/19 2126

## 2019-05-12 NOTE — Discharge Instructions (Signed)
Please read the attachment on postmenopausal bleeding.    Please call your OB/GYN first thing Monday morning to schedule appointment for ongoing evaluation and management of your postmenopausal vaginal bleeding.  Discussed your right-sided flank discomfort and abdominal discomfort with your primary care provider.  If it continues to persist, you may require referral to gastroenterology for ongoing evaluation.  Please return to the ED or seek immediate medical attention for any new or worsening symptoms.

## 2019-05-12 NOTE — ED Triage Notes (Signed)
C/o lower abd pain, lower back pain, and vaginal bleeding since last night.  States she only notices bleeding when wiping.

## 2019-05-14 IMAGING — CT CT HEART MORP W/ CTA COR W/ SCORE W/ CA W/CM &/OR W/O CM
4 of 7 series · 8 of 20 positions shown, 9 images · IV contrast (APPLIED)
Comparison: None.

EXAM:
OVER-READ INTERPRETATION  CT CHEST

The following report is an over-read performed by radiologist Dr.
Fati Flawr Abaoui [REDACTED] on 06/17/2017. This over-read
does not include interpretation of cardiac or coronary anatomy or
pathology. The coronary CTA interpretation by the cardiologist is
attached.
CLINICAL DATA: 51-year-old male with atypical chest pain and
equivocal stress test
Cardiac/Coronary  CT
TECHNIQUE: The patient was scanned on a Phillips Force scanner.

[Series 6: best diast 73 % · axial · 0.30mm/px · z∈[+1164,+1212]mm · 2 of 361 slices shown, 3 images]
[im 121/361  vessel]
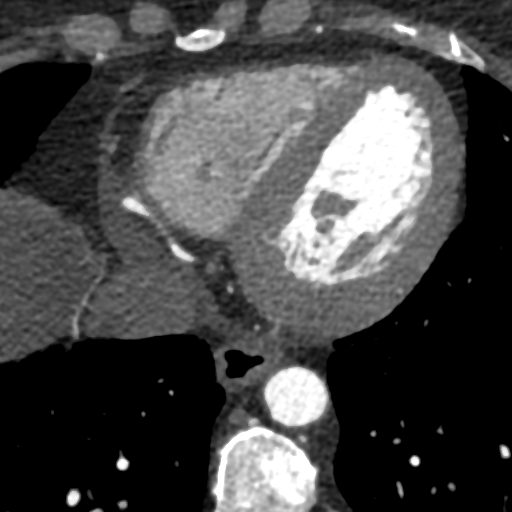
[im 121/361  lung]
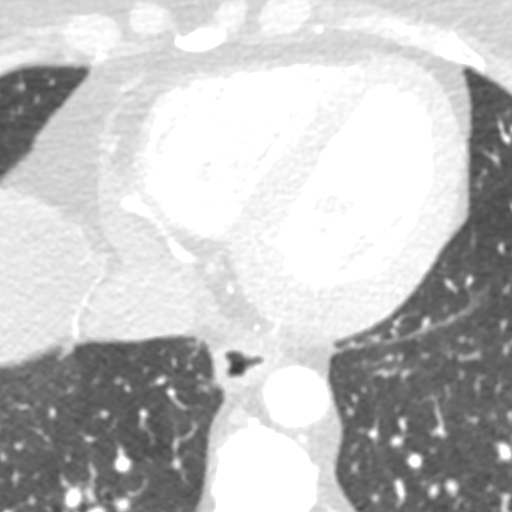
[im 241/361  vessel]
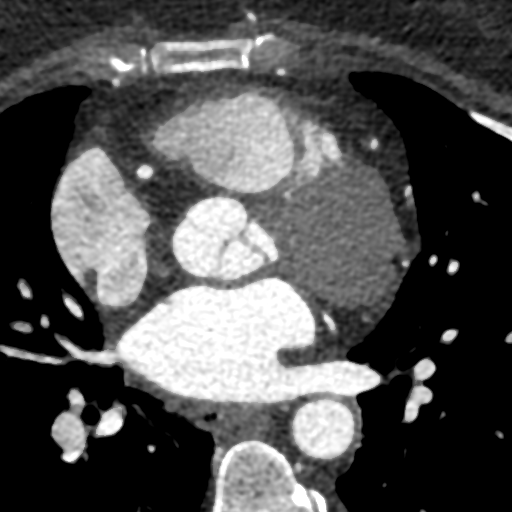

[Series 7: best syst 36 % · axial · 0.30mm/px · z∈[+1164,+1212]mm · 2 of 361 slices shown]
[im 121/361  vessel]
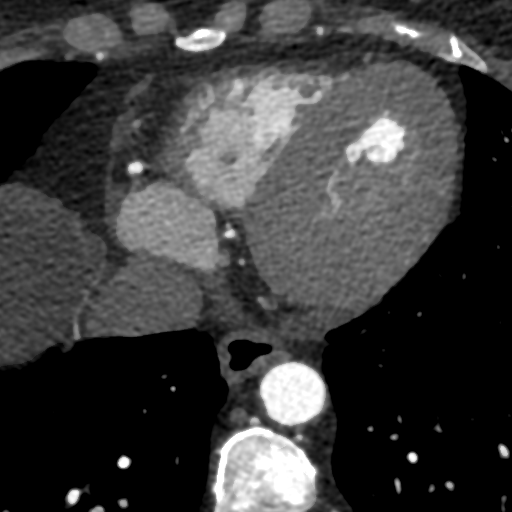
[im 241/361  vessel]
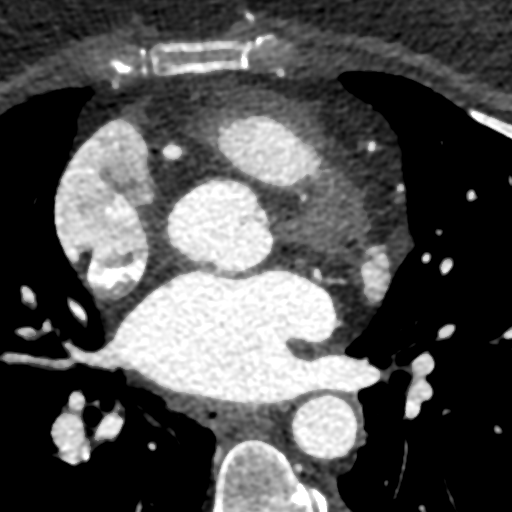

[Series 8: ts diast sharp 73 % · axial · 0.30mm/px · z∈[+1164,+1212]mm · 2 of 361 slices shown]
[im 121/361  lung]
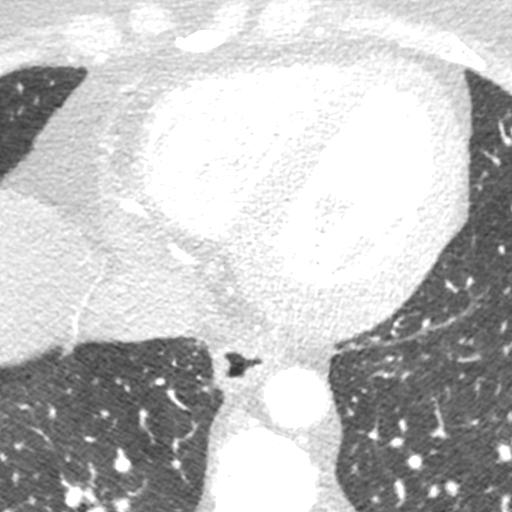
[im 241/361  lung]
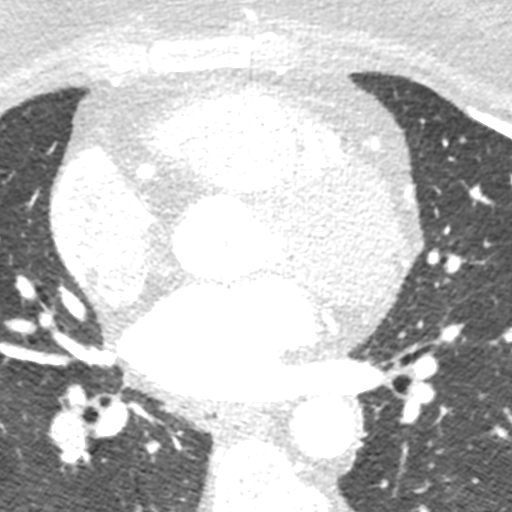

[Series 9: ts syst sharp 36 % · axial · 0.30mm/px · z∈[+1164,+1212]mm · 2 of 361 slices shown]
[im 121/361  lung]
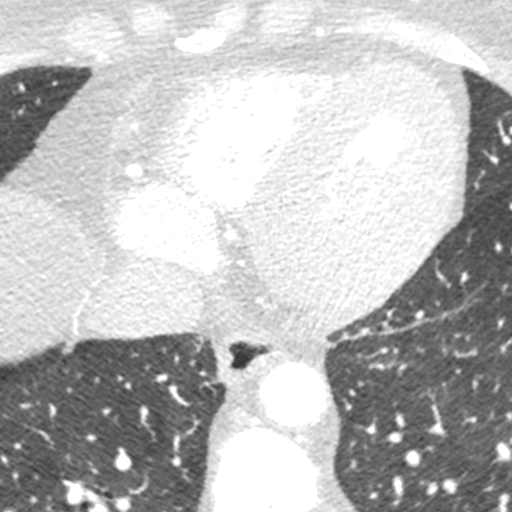
[im 241/361  lung]
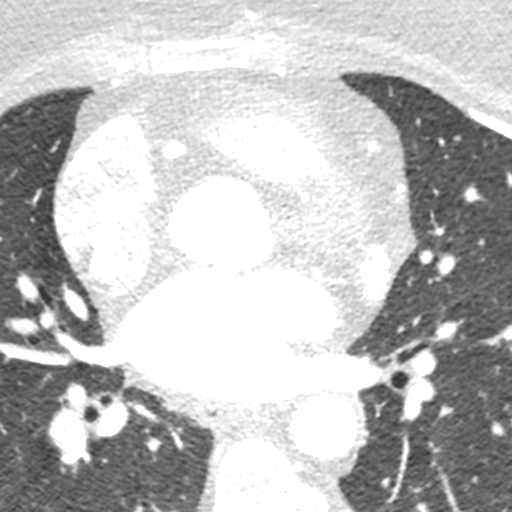

[8 of 20 positions shown; findings below may reference images not displayed]

FINDINGS: Heart is normal size. Visualized aorta is normal caliber. No
adenopathy in the lower mediastinum or hila.

Minimal dependent atelectasis.  No effusions.

Diffuse fatty infiltration of the liver. Chest wall soft tissues are
unremarkable. No acute bony abnormality.
IMPRESSION: Fatty infiltration of the liver.

No acute extra cardiac abnormality.
FINDINGS: A 120 kV prospective scan was triggered in the descending thoracic
aorta at 111 HU's. Axial non-contrast 3 mm slices were carried out
through the heart. The data set was analyzed on a dedicated work
station and scored using the Agatson method. Gantry rotation speed
was 250 msecs and collimation was .6 mm. No beta blockade and 0.8 mg
of sl NTG was given. The 3D data set was reconstructed in 5%
intervals of the 67-82 % of the R-R cycle. Diastolic phases were
analyzed on a dedicated work station using MPR, MIP and VRT modes.
The patient received 80 cc of contrast.

Aorta:  Normal size.  No calcifications.  No dissection.

Aortic Valve:  Trileaflet.  No calcifications.

Coronary Arteries:  Normal coronary origin.  Right dominance.

RCA is a very large dominant artery that gives rise to PDA and PLVB.
There is no plaque.

Left main is a large artery that gives rise to LAD, ramus
intermedius and LCX arteries. Left main has no plaque.

LAD is a medium size vessel that has no plaque.

RI is a medium size artery that has no plaque.

LCX is a medium size non-dominant artery that has no plaque.

Other findings:

Normal pulmonary vein drainage into the left atrium.

Normal let atrial appendage without a thrombus.
IMPRESSION: 1. Coronary calcium score of 0. This was 0 percentile for age and
sex matched control.

2. Normal coronary origin with right dominance.

3. No evidence of CAD.

4. Mildly dilated pulmonary artery measuring 31 mm suggestive of
pulmonary hypertension.

## 2019-05-15 ENCOUNTER — Other Ambulatory Visit: Payer: Self-pay | Admitting: *Deleted

## 2019-05-15 DIAGNOSIS — N926 Irregular menstruation, unspecified: Secondary | ICD-10-CM

## 2019-05-21 ENCOUNTER — Other Ambulatory Visit: Payer: Self-pay

## 2019-05-22 ENCOUNTER — Ambulatory Visit (INDEPENDENT_AMBULATORY_CARE_PROVIDER_SITE_OTHER): Payer: Federal, State, Local not specified - PPO | Admitting: Nurse Practitioner

## 2019-05-22 ENCOUNTER — Other Ambulatory Visit: Payer: Self-pay

## 2019-05-22 ENCOUNTER — Encounter: Payer: Self-pay | Admitting: Nurse Practitioner

## 2019-05-22 VITALS — BP 124/80 | Ht 63.0 in | Wt 183.0 lb

## 2019-05-22 DIAGNOSIS — Z01419 Encounter for gynecological examination (general) (routine) without abnormal findings: Secondary | ICD-10-CM

## 2019-05-22 NOTE — Progress Notes (Signed)
Morgan [name] Feb 09, 1966 [id number]    History:    54 year old MWF G1P1 presents for annual exam. Complaints of lower abdominal pain that is constant and described as burning and cramping. Postmenopausal for 9 years. Blood with wiping that stopped about 1 week ago. Seen in ED on 05/12/2019 for vaginal bleeding, abdominal pain, and nausea. Renal CT showed nonobstructive renal calculi in both kidneys. 04/12/2018 pelvic ultrasound showed thickening of endometrium (9.71mm) and intramural fibroids, cyst on right ovary 19x14 mm. Mother with history of breast cancer, maternal grandmother had uterine cancer. Personal history of cervical dysplasia and endometriosis. Denies vaginal discharge, itching, odor. Denies urinary symptoms. Last pap in 2017 was normal.   Scheduled for sonohysterogram 05/31/2019.  Mammogram scheduled for 05/25/2019. Colonoscopy due 04/2023   Past medical history, past surgical history, family history and social history were all reviewed and documented in the EPIC chart.  ROS:  A ROS was performed and pertinent positives and negatives are included.  Exam:  Vitals:   05/22/19 1136  BP: 124/80  Weight: 183 lb (83 kg)  Height: 5\' 3"  (1.6 m)   Body mass index is 32.42 kg/m.   General appearance:  Normal Thyroid:  Symmetrical, normal in size, without palpable masses or nodularity. Respiratory  Auscultation:  Clear without wheezing or rhonchi Cardiovascular  Auscultation:  Regular rate, without rubs, murmurs or gallops  Edema/varicosities:  Not grossly evident Abdominal  Soft, tender to palpation in mid lower quadrant, mild guarding. Without masses.  Liver/spleen:  No organomegaly noted  Hernia:  None appreciated  Skin  Inspection/:  Grossly normal   Breasts: Examined lying and sitting.     Right: Without masses, retractions, discharge or axillary adenopathy.     Left: Without masses, retractions, discharge or axillary adenopathy. Gentitourinary   Inguinal/mons:   Normal without inguinal adenopathy  External genitalia:  Normal  BUS/Urethra/Skene's glands:  Normal  Vagina:  Normal  Cervix:  Normal, no discharge or bleeding, no CMT  Uterus:  Anteverted, normal in size, shape and contour.  Midline and mobile, tender to palpation during bimanual exam   Adnexa/parametria:     Rt: Without masses or tenderness.   Lt: Without masses or tenderness.  Anus and perineum: Normal    Assessment/Plan:  54 y.o.MWF G1P1  for annual exam.   PM spotting  Labs PC Obesity Kidney stones  Plan: Sonohystogram 05/31/2019. Will call with results. Education provided on postmenopausal bleeding, kidney stones, and health maintenance including exercise and diet. Will see PCP in the next couple of weeks and patient plans to have regular lab work then. Had recent labs at ED on 05/12/2019. Follow up in 1 year or sooner if needed. Pap 2017, pap with HPV typing pending. Screening guidelines reviewed.    Tamela Gammon [facility], 11:40 AM 05/22/2019

## 2019-05-22 NOTE — Patient Instructions (Addendum)
Nice to meet you today! Regular exercise and diabetic diet, multivitamin daily Keep Mammogram and U/S appointments and we will call you  with results.   Kidney Stones Kidney stones are rock-like masses that form inside of the kidneys. Kidneys are organs that make pee (urine). A kidney stone may move into other parts of the urinary tract, including:  The tubes that connect the kidneys to the bladder (ureters).  The bladder.  The tube that carries urine out of the body (urethra). Kidney stones can cause very bad pain and can block the flow of pee. The stone usually leaves your body (passes) through your pee. You may need to have a doctor take out the stone. What are the causes? Kidney stones may be caused by:  A condition in which certain glands make too much parathyroid hormone (primary hyperparathyroidism).  A buildup of a type of crystals in the bladder made of a chemical called uric acid. The body makes uric acid when you eat certain foods.  Narrowing (stricture) of one or both of the ureters.  A kidney blockage that you were born with.  Past surgery on the kidney or the ureters, such as gastric bypass surgery. What increases the risk? You are more likely to develop this condition if:  You have had a kidney stone in the past.  You have a family history of kidney stones.  You do not drink enough water.  You eat a diet that is high in protein, salt (sodium), or sugar.  You are overweight or very overweight (obese). What are the signs or symptoms? Symptoms of a kidney stone may include:  Pain in the side of the belly, right below the ribs (flank pain). Pain usually spreads (radiates) to the groin.  Needing to pee often or right away (urgently).  Pain when going pee (urinating).  Blood in your pee (hematuria).  Feeling like you may vomit (nauseous).  Vomiting.  Fever and chills. How is this treated? Treatment depends on the size, location, and makeup of the  kidney stones. The stones will often pass out of the body through peeing. You may need to:  Drink more fluid to help pass the stone. In some cases, you may be given fluids through an IV tube put into one of your veins at the hospital.  Take medicine for pain.  Make changes in your diet to help keep kidney stones from coming back. Sometimes, medical procedures are needed to remove a kidney stone. This may involve:  A procedure to break up kidney stones using a beam of light (laser) or shock waves.  Surgery to remove the kidney stones. Follow these instructions at home: Medicines  Take over-the-counter and prescription medicines only as told by your doctor.  Ask your doctor if the medicine prescribed to you requires you to avoid driving or using heavy machinery. Eating and drinking  Drink enough fluid to keep your pee pale yellow. You may be told to drink at least 8-10 glasses of water each day. This will help you pass the stone.  If told by your doctor, change your diet. This may include: ? Limiting how much salt you eat. ? Eating more fruits and vegetables. ? Limiting how much meat, poultry, fish, and eggs you eat.  Follow instructions from your doctor about eating or drinking restrictions. General instructions  Collect pee samples as told by your doctor. You may need to collect a pee sample: ? 24 hours after a stone comes out. ? 8-12 weeks  after a stone comes out, and every 6-12 months after that.  Strain your pee every time you pee (urinate), for as long as told. Use the strainer that your doctor recommends.  Do not throw out the stone. Keep it so that it can be tested by your doctor.  Keep all follow-up visits as told by your doctor. This is important. You may need follow-up tests. How is this prevented? To prevent another kidney stone:  Drink enough fluid to keep your pee pale yellow. This is the best way to prevent kidney stones.  Eat healthy foods.  Avoid certain  foods as told by your doctor. You may be told to eat less protein.  Stay at a healthy weight. Where to find more information  [place] ([facility]): www.kidney.[facility]): www.urologyhealth.org Contact a doctor if:  You have pain that gets worse or does not get better with medicine. Get help right away if:  You have a fever or chills.  You get very bad pain.  You get new pain in your belly (abdomen).  You pass out (faint).  You cannot pee. Summary  Kidney stones are rock-like masses that form inside of the kidneys.  Kidney stones can cause very bad pain and can block the flow of pee.  The stones will often pass out of the body through peeing.  Drink enough fluid to keep your pee pale yellow. This information is not intended to replace advice given to you by your health care provider. Make sure you discuss any questions you have with your health care provider. Document Revised: 06/20/2018 Document Reviewed: 06/20/2018 Elsevier Patient Education  [place]. Postmenopausal Bleeding  Postmenopausal bleeding is any bleeding that a woman has after she has entered into menopause. Menopause is the end of a woman's fertile years. After menopause, a woman no longer ovulates and does not have menstrual periods. Postmenopausal bleeding may have various causes, including:  Menopausal hormone therapy (MHT).  Endometrial atrophy. After menopause, low estrogen hormone levels cause the membrane that lines the uterus (endometrium) to become thinner. You may have bleeding as the endometrium thins.  Endometrial hyperplasia. This condition is caused by excess estrogen hormones and low levels of progesterone hormones. The excess estrogen causes the endometrium to thicken, which can lead to bleeding. In some cases, this can lead to cancer of the uterus.  Endometrial cancer.  Non-cancerous growths (polyps) on the endometrium, the lining of the  uterus, or the cervix.  Uterine fibroids. These are non-cancerous growths in or around the uterus muscle tissue that can cause heavy bleeding. Any type of postmenopausal bleeding, even if it appears to be a typical menstrual period, should be evaluated by your health care provider. Treatment will depend on the cause of the bleeding. Follow these instructions at home:  Pay attention to any changes in your symptoms.  Avoid using tampons and douches as told by your health care provider.  Change your pads regularly.  Get regular pelvic exams and Pap tests.  Take iron supplements as told by your health care provider.  Take over-the-counter and prescription medicines only as told by your health care provider.  Keep all follow-up visits as told by your health care provider. This is important. Contact a health care provider if:  Your bleeding lasts more than 1 week.  You have abdominal pain.  You have bleeding with or after sexual intercourse.  You have bleeding that happens more often than every 3 weeks. Get help  right away if:  You have a fever, chills, headache, dizziness, muscle aches, and bleeding.  You have severe pain with bleeding.  You are passing blood clots.  You have heavy bleeding, need more than 1 pad an hour, and have never experienced this before.  You feel faint. Summary  Postmenopausal bleeding is any bleeding that a woman has after she has entered into menopause.  Postmenopausal bleeding may have various causes. Treatment will depend on the cause of the bleeding.  Any type of postmenopausal bleeding, even if it appears to be a typical menstrual period, should be evaluated by your health care provider.  Be sure to pay attention to any changes in your symptoms and keep all follow-up visits as told by your health care provider. This information is not intended to replace advice given to you by your health care provider. Make sure you discuss any questions you  have with your health care provider. Document Revised: 05/11/2017 Document Reviewed: 04/27/2016 Elsevier Patient Education  [place] Maintenance After Age 30 After age 38, you are at a higher risk for certain long-term diseases and infections as well as injuries from falls. Falls are a major cause of broken bones and head injuries in people who are older than age 49. Getting regular preventive care can help to keep you healthy and well. Preventive care includes getting regular testing and making lifestyle changes as recommended by your health care provider. Talk with your health care provider about:  Which screenings and tests you should have. A screening is a test that checks for a disease when you have no symptoms.  A diet and exercise plan that is right for you. What should I know about screenings and tests to prevent falls? Screening and testing are the best ways to find a health problem early. Early diagnosis and treatment give you the best chance of managing medical conditions that are common after age 45. Certain conditions and lifestyle choices may make you more likely to have a fall. Your health care provider may recommend:  Regular vision checks. Poor vision and conditions such as cataracts can make you more likely to have a fall. If you wear glasses, make sure to get your prescription updated if your vision changes.  Medicine review. Work with your health care provider to regularly review all of the medicines you are taking, including over-the-counter medicines. Ask your health care provider about any side effects that may make you more likely to have a fall. Tell your health care provider if any medicines that you take make you feel dizzy or sleepy.  Osteoporosis screening. Osteoporosis is a condition that causes the bones to get weaker. This can make the bones weak and cause them to break more easily.  Blood pressure screening. Blood pressure changes and medicines to  control blood pressure can make you feel dizzy.  Strength and balance checks. Your health care provider may recommend certain tests to check your strength and balance while standing, walking, or changing positions.  Foot health exam. Foot pain and numbness, as well as not wearing proper footwear, can make you more likely to have a fall.  Depression screening. You may be more likely to have a fall if you have a fear of falling, feel emotionally low, or feel unable to do activities that you used to do.  Alcohol use screening. Using too much alcohol can affect your balance and may make you more likely to have a fall. What actions can I take  to lower my risk of falls? General instructions  Talk with your health care provider about your risks for falling. Tell your health care provider if: ? You fall. Be sure to tell your health care provider about all falls, even ones that seem minor. ? You feel dizzy, sleepy, or off-balance.  Take over-the-counter and prescription medicines only as told by your health care provider. These include any supplements.  Eat a healthy diet and maintain a healthy weight. A healthy diet includes low-fat dairy products, low-fat (lean) meats, and fiber from whole grains, beans, and lots of fruits and vegetables. Home safety  Remove any tripping hazards, such as rugs, cords, and clutter.  Install safety equipment such as grab bars in bathrooms and safety rails on stairs.  Keep rooms and walkways well-lit. Activity   Follow a regular exercise program to stay fit. This will help you maintain your balance. Ask your health care provider what types of exercise are appropriate for you.  If you need a cane or walker, use it as recommended by your health care provider.  Wear supportive shoes that have nonskid soles. Lifestyle  Do not drink alcohol if your health care provider tells you not to drink.  If you drink alcohol, limit how much you have: ? 0-1 drink a day  for women. ? 0-2 drinks a day for men.  Be aware of how much alcohol is in your drink. In the [place] one drink equals one typical bottle of beer (12 oz), one-half glass of wine (5 oz), or one shot of hard liquor (1 oz).  Do not use any products that contain nicotine or tobacco, such as cigarettes and e-cigarettes. If you need help quitting, ask your health care provider. Summary  Having a healthy lifestyle and getting preventive care can help to protect your health and wellness after age 3.  Screening and testing are the best way to find a health problem early and help you avoid having a fall. Early diagnosis and treatment give you the best chance for managing medical conditions that are more common for people who are older than age 72.  Falls are a major cause of broken bones and head injuries in people who are older than age 74. Take precautions to prevent a fall at home.  Work with your health care provider to learn what changes you can make to improve your health and wellness and to prevent falls. This information is not intended to replace advice given to you by your health care provider. Make sure you discuss any questions you have with your health care provider. Document Revised: 05/25/2018 Document Reviewed: 12/15/2016 Elsevier Patient Education  2020 [employer].

## 2019-05-23 LAB — PAP, TP IMAGING W/ HPV RNA, RFLX HPV TYPE 16,18/45: HPV DNA High Risk: NOT DETECTED

## 2019-05-24 ENCOUNTER — Other Ambulatory Visit: Payer: Self-pay | Admitting: General Surgery

## 2019-05-24 DIAGNOSIS — N6021 Fibroadenosis of right breast: Secondary | ICD-10-CM

## 2019-05-25 ENCOUNTER — Other Ambulatory Visit: Payer: Self-pay

## 2019-05-25 ENCOUNTER — Ambulatory Visit: Payer: Federal, State, Local not specified - PPO

## 2019-05-25 ENCOUNTER — Ambulatory Visit
Admission: RE | Admit: 2019-05-25 | Discharge: 2019-05-25 | Disposition: A | Discharge status: Home / Self Care | Payer: Federal, State, Local not specified - PPO | Source: Ambulatory Visit | Attending: General Surgery | Admitting: General Surgery

## 2019-05-25 DIAGNOSIS — N6021 Fibroadenosis of right breast: Secondary | ICD-10-CM

## 2019-05-30 ENCOUNTER — Other Ambulatory Visit: Payer: Self-pay

## 2019-05-31 ENCOUNTER — Encounter: Payer: Self-pay | Admitting: Obstetrics and Gynecology

## 2019-05-31 ENCOUNTER — Ambulatory Visit: Payer: Federal, State, Local not specified - PPO | Admitting: Obstetrics and Gynecology

## 2019-05-31 ENCOUNTER — Other Ambulatory Visit: Payer: Self-pay | Admitting: Women's Health

## 2019-05-31 ENCOUNTER — Ambulatory Visit (INDEPENDENT_AMBULATORY_CARE_PROVIDER_SITE_OTHER): Payer: Federal, State, Local not specified - PPO

## 2019-05-31 DIAGNOSIS — N83201 Unspecified ovarian cyst, right side: Secondary | ICD-10-CM | POA: Diagnosis not present

## 2019-05-31 DIAGNOSIS — N926 Irregular menstruation, unspecified: Secondary | ICD-10-CM | POA: Diagnosis not present

## 2019-05-31 DIAGNOSIS — R9389 Abnormal findings on diagnostic imaging of other specified body structures: Secondary | ICD-10-CM

## 2019-05-31 DIAGNOSIS — N950 Postmenopausal bleeding: Secondary | ICD-10-CM | POA: Diagnosis not present

## 2019-05-31 DIAGNOSIS — N83202 Unspecified ovarian cyst, left side: Secondary | ICD-10-CM | POA: Diagnosis not present

## 2019-05-31 DIAGNOSIS — N854 Malposition of uterus: Secondary | ICD-10-CM

## 2019-05-31 NOTE — Progress Notes (Signed)
Morgan [name]  August 14, 1965 [id number]  HPI The patient is a 54 y.o. G1P1001 postmenopausal female with a history of endometriosis who presents today for a pelvic ultrasound.  At her routine annual exam on 05/22/2019 she was noting lower abdominal pain in addition to some light vaginal bleeding that had started the previous week first noted with wiping after using the restroom.  She has been postmenopausal for about 9 years.  A Pap smear was collected showing normal cytology and negative HPV test.  She initially went to the ED on 05/12/2019 for the abdominal pain, nausea, and the vaginal bleeding.  Previous imaging from 04/12/2018 showed that she had on pelvic ultrasound thickening of the endometrium at 9.4 mm with small intramural fibroid 1.3 x 1.4 cm, and a simple cyst on the right ovary 1.9 x 1.4 cm.  Review of the image also indicates that there is a 11 mm cystic structure on the left ovary without vascularity or debris.  Family history review indicates her mother had history of breast cancer, maternal grandmother had history of uterine cancer.  Past medical history,surgical history, problem list, medications, allergies, family history and social history were all reviewed and documented as reviewed in the EPIC chart.  ROS:  Feeling well. No dyspnea or chest pain on exertion.  No abdominal pain, change in bowel habits, black or bloody stools.  No urinary tract symptoms. GYN ROS: Symptoms as described in HPI  Physical Exam  LMP 05/12/2011 Comment: no period in 7 years   General: Pleasant female, no acute distress, alert and oriented PELVIC EXAM: VULVA: normal appearing vulva with no masses, tenderness or lesions, VAGINA: normal appearing vagina with normal color and discharge, no lesions, CERVIX: normal appearing cervix without discharge or lesions: UTERUS: Palpates to be normal size, mobile, nontender, ADNEXA: No discrete mass appreciated, exam limited by body habitus, mild tenderness to deep  palpation in the midline and bilaterally in the lower quadrants  Pelvic ultrasound Anteverted uterus 9.4 x 5.5 x 4.1 cm. Thickened irregular endometrium of 7.4 mm thickness with cystic changes.  Feeder vessel and numerous endometrial polyps appear on 3D imaging. 1.1 cm submucosal fibroid identified on 3D imaging. Stable right ovarian cyst appears simple, 2 simple follicles 1.4 cm and 1.0 cm in dimension. Left ovary with new 1.6 x 1.6 mm cystic structure with debris, internal vascularity noted. No pelvic free fluid.  Procedure Endometrial biopsy The cervix was cleansed with a Betadine swab x1. The endometrial Pipelle sampler was easily introduced into the endometrial cavity and sound length measurement was taken.  The plunger was withdrawn causing suction in the Pipelle and the device was moved about the cavity for about 10 sec.  The Pipelle was removed then the sample was emptied in a specimen cup, maintaining sterility.  The Pipelle was reintroduced into the endometrial cavity to collect any remaining sample.  The specimen contents were collected and sent to pathology for analysis.  The patient tolerated the procedure well without any complication.  Johnsie Kindred, RDMS, present for exam and procedure  Assessment 54 yo G1 P42 postmenopausal female with postmenopausal bleeding and thickened endometrium likely with endometrial polyps, simple right ovarian cyst, newly identified left ovarian cyst with complexity  Plan Postmenopausal bleeding.  Endometrial thickening noticed on ultrasound today.  Endometrial biopsy is taken.  We discussed that blind biopsy may not necessarily sample polyps very well which we suspect she has based on the review of the ultrasound images.  We will let her know the  results when we receive these.  She understands that hysteroscopy D&C may be indicated if there is a benign result and the bleeding continues. With the bilateral ovarian cysts, particularly the left ovarian  cyst, it is concerning that this has grown in the interval since imaging last year and it appears complex.  The right ovarian cyst may simply be a remnant of a follicular cyst from premenopause.  I would like her to check tumor markers which we will collect today including CA-125, CEA, inhibin A and B.  We will have her back in clinic to further discuss results when available and the next steps moving forward.  Joseph Pierini MD, FACOG 05/31/19

## 2019-06-04 ENCOUNTER — Emergency Department (HOSPITAL_COMMUNITY)
Admission: EM | Admit: 2019-06-04 | Discharge: 2019-06-05 | Disposition: A | Discharge status: Home / Self Care | Payer: Federal, State, Local not specified - PPO | Attending: Emergency Medicine | Admitting: Emergency Medicine

## 2019-06-04 ENCOUNTER — Encounter (HOSPITAL_COMMUNITY): Payer: Self-pay | Admitting: Emergency Medicine

## 2019-06-04 DIAGNOSIS — Z794 Long term (current) use of insulin: Secondary | ICD-10-CM | POA: Insufficient documentation

## 2019-06-04 DIAGNOSIS — Z79899 Other long term (current) drug therapy: Secondary | ICD-10-CM | POA: Insufficient documentation

## 2019-06-04 DIAGNOSIS — R1084 Generalized abdominal pain: Secondary | ICD-10-CM | POA: Diagnosis not present

## 2019-06-04 DIAGNOSIS — R112 Nausea with vomiting, unspecified: Secondary | ICD-10-CM | POA: Diagnosis not present

## 2019-06-04 DIAGNOSIS — E119 Type 2 diabetes mellitus without complications: Secondary | ICD-10-CM | POA: Insufficient documentation

## 2019-06-04 DIAGNOSIS — I10 Essential (primary) hypertension: Secondary | ICD-10-CM | POA: Diagnosis not present

## 2019-06-04 LAB — COMPREHENSIVE METABOLIC PANEL
ALT: 20 U/L (ref 0–44)
AST: 19 U/L (ref 15–41)
Albumin: 3.5 g/dL (ref 3.5–5.0)
Alkaline Phosphatase: 93 U/L (ref 38–126)
Anion gap: 7 (ref 5–15)
BUN: 9 mg/dL (ref 6–20)
CO2: 27 mmol/L (ref 22–32)
Calcium: 9.7 mg/dL (ref 8.9–10.3)
Chloride: 104 mmol/L (ref 98–111)
Creatinine, Ser: 0.64 mg/dL (ref 0.44–1.00)
GFR calc Af Amer: >60 mL/min (ref >60)
GFR calc non Af Amer: >60 mL/min (ref >60)
Glucose, Bld: 217 mg/dL — ABNORMAL HIGH (ref 70–99)
Potassium: 4 mmol/L (ref 3.5–5.1)
Sodium: 138 mmol/L (ref 135–145)
Total Bilirubin: 0.4 mg/dL (ref 0.3–1.2)
Total Protein: 6.4 g/dL — ABNORMAL LOW (ref 6.5–8.1)

## 2019-06-04 LAB — TISSUE SPECIMEN

## 2019-06-04 LAB — PATHOLOGY REPORT

## 2019-06-04 NOTE — ED Triage Notes (Signed)
Pt c/o dark stool, bloating and nausea x 2 days.

## 2019-06-05 ENCOUNTER — Other Ambulatory Visit: Payer: Self-pay

## 2019-06-05 LAB — TYPE AND SCREEN
ABO/RH(D): A POS
Antibody Screen: NEGATIVE

## 2019-06-05 LAB — CBC
HCT: 39.7 % (ref 36.0–46.0)
Hemoglobin: 12.1 g/dL (ref 12.0–15.0)
MCH: 25.3 pg — ABNORMAL LOW (ref 26.0–34.0)
MCHC: 30.5 g/dL (ref 30.0–36.0)
MCV: 83.1 fL (ref 80.0–100.0)
Platelets: 192 10*3/uL (ref 150–400)
RBC: 4.78 MIL/uL (ref 3.87–5.11)
RDW: 14.9 % (ref 11.5–15.5)
WBC: 6.8 10*3/uL (ref 4.0–10.5)
nRBC: 0 % (ref 0.0–0.2)

## 2019-06-05 LAB — TROPONIN I (HIGH SENSITIVITY)
Troponin I (High Sensitivity): <2 ng/L (ref <18)
Troponin I (High Sensitivity): 2 ng/L (ref <18)

## 2019-06-05 LAB — INHIBIN B: Inhibin B: 73 pg/mL

## 2019-06-05 LAB — INHIBIN A: Inhibin A: 8 pg/mL

## 2019-06-05 LAB — POC OCCULT BLOOD, ED: Fecal Occult Bld: NEGATIVE

## 2019-06-05 LAB — CEA: CEA: 1 ng/mL

## 2019-06-05 LAB — CA 125: CA 125: 15 U/mL (ref <35)

## 2019-06-05 NOTE — ED Provider Notes (Signed)
[facility] EMERGENCY DEPARTMENT Provider Note   CSN: [id number] Arrival date & time: 06/04/19  2131     History Chief Complaint  Patient presents with  . Abdominal Pain    Morgan [name] is a 54 y.o. female.  HPI She presents for evaluation of crampy abdominal pain associated with dark-colored stool without visible blood, for several days.  She has nausea without vomiting.  She has been able to eat and drink some.  She denies fever, chills, dysuria or urinary frequency.  She is taking her usual medications.  She had prior episode of rectal bleeding, when evaluated on 05/12/2019.  At that time she was also having vaginal bleeding.  She was subsequently referred to GYN who she saw on 05/22/2019.  She was scheduled for sonohysterogram on 05/31/2019.  Results documented in this EMR include thickened irregular endometrium, endometrial polyps, submucosal fibroid, left ovarian cystic structure.  She had endometrial biopsy, that day to evaluate uterine bleeding, with tumor markers sent, to evaluate the left ovarian cystic structure.  She is scheduled for surgical procedure on 06/18/2019 , for right breast lesion, biopsy. There are no other known modifying factors.    Past Medical History:  Diagnosis Date  . Anxiety   . Bipolar disorder ([place])   . Breast mass    right  . Cervical dysplasia   . Diabetes mellitus without complication ([place])   . Endometriosis   . GERD (gastroesophageal reflux disease)   . Hypercholesteremia   . Hypertension   . Migraine   . Pelvic kidney    left  . Postpartum depression    hx of    Patient Active Problem List   Diagnosis Date Noted  . Chest pain 06/14/2017  . Hypertension complicating diabetes ([place]) 06/14/2017  . Type 2 diabetes mellitus with hyperlipidemia ([place]) 06/14/2017  . Hyperlipidemia associated with type 2 diabetes mellitus ([place]) 06/14/2017  . Cervical dysplasia   . Bipolar disorder [facility])     Past Surgical History:    Procedure Laterality Date  . BACK SURGERY  2014  . BREAST LUMPECTOMY WITH RADIOACTIVE SEED LOCALIZATION Right 10/28/2014   Procedure: RIGHT BREAST LUMPECTOMY WITH RADIOACTIVE SEED LOCALIZATION;  Surgeon: [name], MD;  Location: [place];  Service: General;  Laterality: Right;  . CESAREAN SECTION  03  . COLONOSCOPY WITH PROPOFOL N/A 05/01/2018   Procedure: COLONOSCOPY WITH PROPOFOL;  Surgeon: [name], MD;  Location: [facility] ENDOSCOPY;  Service: Gastroenterology;  Laterality: N/A;  . COLPOSCOPY    . ESOPHAGOGASTRODUODENOSCOPY (EGD) WITH PROPOFOL N/A 05/01/2018   Procedure: ESOPHAGOGASTRODUODENOSCOPY (EGD) WITH PROPOFOL;  Surgeon: [name], MD;  Location: [facility] ENDOSCOPY;  Service: Gastroenterology;  Laterality: N/A;  . ESOPHAGOGASTRODUODENOSCOPY (EGD) WITH PROPOFOL N/A 11/13/2018   Procedure: ESOPHAGOGASTRODUODENOSCOPY (EGD) WITH PROPOFOL;  Surgeon: [name], MD;  Location: [facility] ENDOSCOPY;  Service: Gastroenterology;  Laterality: N/A;  . GYNECOLOGIC CRYOSURGERY    . NECK SURGERY  2012  . PELVIC LAPAROSCOPY  74     OB History    Gravida  1   Para  1   Term  1   Preterm      AB  0   Living  1     SAB      TAB      Ectopic  0   Multiple      Live Births              Family History  Problem Relation Age of Onset  .  Diabetes Mother   . Breast cancer Mother 40  . Thyroid disease Mother   . Depression Mother   . Hypertension Father   . Heart disease Father   . Heart attack Father 44       Two instances, first at age 57  . Stroke Father   . Alcohol abuse Father   . Throat cancer Father   . Diabetes Maternal Grandmother   . Depression Maternal Grandmother   . Uterine cancer Maternal Grandmother     Social History   Tobacco Use  . Smoking status: Never Smoker  . Smokeless tobacco: Never Used  Substance Use Topics  . Alcohol use: No  . Drug use: No    Home Medications Prior to Admission medications   Medication Sig Start Date  End Date Taking? Authorizing Provider  ARIPiprazole (ABILIFY) 15 MG tablet Take 1 tablet by mouth at bedtime. 12/21/17  Yes Noemi Chapel, NP  atorvastatin (LIPITOR) 40 MG tablet Take 1 tablet (40 mg total) by mouth daily. 08/28/18  Yes Scot Jun, FNP  celecoxib (CELEBREX) 100 MG capsule Take 100 mg by mouth daily. 05/11/19  Yes [provider]  DULoxetine (CYMBALTA) 60 MG capsule Take 60 mg by mouth at bedtime. 11/04/16  Yes [provider]  insulin aspart (NOVOLOG) 100 UNIT/ML injection Inject 5 Units into the skin 3 (three) times daily before meals. Patient taking differently: Inject 10 Units into the skin 3 (three) times daily before meals.  12/13/18 06/05/19 Yes Gildardo Pounds, NP  losartan (COZAAR) 100 MG tablet Take 1 tablet daily by mouth. 08/24/18  Yes Scot Jun, FNP  omeprazole (PRILOSEC) 20 MG capsule Take 20 mg by mouth at bedtime.    Yes [provider]  XIGDUO XR 10-500 MG TB24 TAKE 1 TABLET BY MOUTH EVERY DAY 03/05/19  Yes Gildardo Pounds, NP  Insulin Syringes, Disposable, U-100 1 ML MISC Use as instructed. Inject into the skin three times per day 12/12/18   Gildardo Pounds, NP    Allergies    Clindamycin/lincomycin and Lamotrigine  Review of Systems   Review of Systems  All other systems reviewed and are negative.   Physical Exam Updated Vital Signs BP 133/76   Pulse 80   Temp 97.7 F (36.5 C) (Oral)   Resp 14   Ht '5\' 1"'$  (1.549 m)   Wt 81 kg   LMP 05/12/2011 Comment: no period in 7 years   SpO2 100%   BMI 33.74 kg/m   Physical Exam Vitals and nursing note reviewed.  Constitutional:      General: She is not in acute distress.    Appearance: She is well-developed. She is not ill-appearing, toxic-appearing or diaphoretic.  HENT:     Head: Normocephalic and atraumatic.     Mouth/Throat:     Pharynx: No pharyngeal swelling or oropharyngeal exudate.  Eyes:     Conjunctiva/sclera: Conjunctivae normal.     Pupils: Pupils  are equal, round, and reactive to light.  Neck:     Trachea: Phonation normal.  Cardiovascular:     Rate and Rhythm: Normal rate and regular rhythm.  Pulmonary:     Effort: Pulmonary effort is normal.     Breath sounds: Normal breath sounds.  Chest:     Chest wall: No tenderness.  Abdominal:     General: There is no distension.     Palpations: Abdomen is soft.     Tenderness: There is no abdominal tenderness. There is no  guarding.  Genitourinary:    Comments: Normal anus.  Small amount of green to brown color stool in rectal vault.  No rectal mass.  No external or internal hemorrhoids. Musculoskeletal:        General: Normal range of motion.     Cervical back: Normal range of motion and neck supple.  Skin:    General: Skin is warm and dry.  Neurological:     Mental Status: She is alert and oriented to person, place, and time.     Motor: No abnormal muscle tone.  Psychiatric:        Behavior: Behavior normal.        Thought Content: Thought content normal.        Judgment: Judgment normal.     ED Results / Procedures / Treatments   Labs (all labs ordered are listed, but only abnormal results are displayed) Labs Reviewed  COMPREHENSIVE METABOLIC PANEL - Abnormal; Notable for the following components:      Result Value   Glucose, Bld 217 (*)    Total Protein 6.4 (*)    All other components within normal limits  CBC - Abnormal; Notable for the following components:   MCH 25.3 (*)    All other components within normal limits  POC OCCULT BLOOD, ED  POC OCCULT BLOOD, ED  TYPE AND SCREEN  TROPONIN I (HIGH SENSITIVITY)  TROPONIN I (HIGH SENSITIVITY)    EKG EKG Interpretation  Date/Time:  Tuesday June 05 2019 04:40:30 EDT Ventricular Rate:  70 PR Interval:  198 QRS Duration: 78 QT Interval:  370 QTC Calculation: 399 R Axis:   47 Text Interpretation: Normal sinus rhythm Nonspecific ST abnormality Abnormal ECG Artifact Baseline wander Since last tracing rate slower  Confirmed by Daleen Bo [phone]) on 06/05/2019 8:11:34 AM   Radiology No results found.  Procedures Procedures (including critical care time)  Medications Ordered in ED Medications - No data to display  ED Course  I have reviewed the triage vital signs and the nursing notes.  Pertinent labs & imaging results that were available during my care of the patient were reviewed by me and considered in my medical decision making (see chart for details).  Clinical Course as of Jun 05 935  Tue Jun 05, 2019  0809 Troponin I (High Sensitivity) [EW]  [phone] Normal  Troponin I (High Sensitivity) [EW]  0810 Normal except glucose high, total protein low  Comprehensive metabolic panel(!) [EW]  5852 Normal  CBC(!) [EW]  [id number] Orthostatic blood pressure and pulse, negative for orthostasis.   [EW]  0932 Normal  POC occult blood, ED [EW]    Clinical Course User Index [EW] Daleen Bo, MD   MDM Rules/Calculators/A&P                       Patient Vitals for the past 24 hrs:  BP Temp Temp src Pulse Resp SpO2 Height Weight  06/05/19 0735 133/76 -- -- 80 14 100 % -- --  06/05/19 0413 129/73 97.7 F (36.5 C) Oral 70 16 100 % -- --  06/05/19 0206 124/76 -- -- 98 18 98 % -- --  06/04/19 2211 133/68 98 F (36.7 C) Oral 77 16 96 % -- --  06/04/19 2208 -- -- -- -- -- -- '5\' 1"'$  (1.549 m) 81 kg    9:37 AM Reevaluation with update and discussion. After initial assessment and treatment, an updated evaluation reveals she is comfortable has no further complaints.  Vital signs  reassuring.  And discussed and questions answered. Daleen Bo   Medical Decision Making:  This patient is presenting for evaluation of abdominal pain with dark color stool., which does require a range of treatment options, and is a complaint that involves a moderate risk of morbidity and mortality. The differential diagnoses include hemorrhoids, enteritis, intestinal bleeding, upper GI bleeding. I decided  to review old  records, and in summary relatively healthy female, currently under evaluation and treatment for endometrial thickening and left ovarian cyst, right breast lesion scheduled for biopsy, without history of source for GI bleeding that has been present on and off for at least 2 months.. I did not require additional historical information from anyone. Clinical Laboratory Tests Ordered, included CBC, c-Met, fecal occult blood testing, troponin.  These results are reviewed and are reassuring with the exception of mild elevation of glucose Radiologic Tests Ordered, included none.  Specimen off stool, visualized by me, indicating normal color.   Critical Interventions-clinical and laboratory evaluation  After These Interventions, the Patient was reevaluated and was found to be stable, and ready for discharge.  No evidence of GI bleeding, at this time.  No clinical evidence of acute intra-abdominal process.  No indication for further ED evaluation.  Stable ongoing evaluations, in process for breast lesion and GYN pathology.  CRITICAL CARE-no Performed by: Daleen Bo   Nursing Notes Reviewed/ Care Coordinated Applicable Imaging Reviewed Interpretation of Laboratory Data incorporated into ED treatment  The patient appears reasonably screened and/or stabilized for discharge and I doubt any other medical condition or other [facility] requiring further screening, evaluation, or treatment in the ED at this time prior to discharge.  Plan: Home Medications-continue usual; Home Treatments-rest, fluids; return here if the recommended treatment, does not improve the symptoms; Recommended follow up-follow-up PCP regarding abnormal color stool and abdominal pain, follow-up with general surgery, and GYN as scheduled   Final Clinical Impression(s) / ED Diagnoses Final diagnoses:  Generalized abdominal pain    Rx / DC Orders ED Discharge Orders    None       Daleen Bo, MD 06/05/19 [phone]

## 2019-06-05 NOTE — ED Notes (Signed)
Pt informed staff she was now having chest pain.  Vitals retaken and brought into triage.  Trop and EKG ordered and done.

## 2019-06-05 NOTE — Discharge Instructions (Signed)
There is no evidence for bleeding in your bowel movements at this time.  Please see your doctor for further evaluation of the discomfort and possible bleeding.  See your general surgeon regarding the breast lesion as scheduled and your gynecologist for follow-up on the endometrial biopsy results.

## 2019-06-05 NOTE — ED Notes (Signed)
After this NT finished updating vitals the patient stated she was starting to have chest pain. Ekg done.

## 2019-06-06 ENCOUNTER — Telehealth: Payer: Self-pay | Admitting: Obstetrics and Gynecology

## 2019-06-06 ENCOUNTER — Ambulatory Visit: Payer: Federal, State, Local not specified - PPO | Admitting: Gastroenterology

## 2019-06-06 NOTE — Telephone Encounter (Signed)
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  I told her that I would like to discuss the case further with our gyn oncologist and we will get back to her regarding the next steps in her management.

## 2019-06-11 ENCOUNTER — Other Ambulatory Visit: Payer: Self-pay

## 2019-06-11 ENCOUNTER — Encounter (HOSPITAL_BASED_OUTPATIENT_CLINIC_OR_DEPARTMENT_OTHER): Payer: Self-pay | Admitting: General Surgery

## 2019-06-11 NOTE — Progress Notes (Signed)
Chart reviewed with Dr. Doroteo Glassman.  Will proceed with surgery as scheduled without need for further clearance/testing.

## 2019-06-12 ENCOUNTER — Other Ambulatory Visit: Payer: Self-pay | Admitting: Family Medicine

## 2019-06-13 ENCOUNTER — Encounter: Payer: Self-pay | Admitting: Nurse Practitioner

## 2019-06-13 ENCOUNTER — Telehealth: Payer: Self-pay | Admitting: *Deleted

## 2019-06-13 DIAGNOSIS — N83202 Unspecified ovarian cyst, left side: Secondary | ICD-10-CM

## 2019-06-13 DIAGNOSIS — N950 Postmenopausal bleeding: Secondary | ICD-10-CM

## 2019-06-13 DIAGNOSIS — N8500 Endometrial hyperplasia, unspecified: Secondary | ICD-10-CM

## 2019-06-13 DIAGNOSIS — R9389 Abnormal findings on diagnostic imaging of other specified body structures: Secondary | ICD-10-CM

## 2019-06-13 NOTE — Telephone Encounter (Signed)
Patient called back to follow up from your conversation with her on telephone encounter 06/06/19. Please advise

## 2019-06-13 NOTE — Telephone Encounter (Signed)
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  I also recommended that we check a pelvic ultrasound again in 3 months.  In regard to the simple hyperplasia diagnosed by office biopsy, in the setting of polyps identified on the ultrasound, I recommend that we go ahead and do a hysteroscopy D&C for more thorough endometrial evaluation.  I will have staff reach out to contact [employer] to arrange this.

## 2019-06-13 NOTE — Telephone Encounter (Signed)
I did reach out to the patient as documented in the telephone encounter note.  A surgery request form has been sent separately to [facility] (for hysteroscopy D&C).  I also would like Robynne to check inhibin B level and a pelvic ultrasound again in 3 months, both of these tests are ordered.

## 2019-06-14 ENCOUNTER — Other Ambulatory Visit (HOSPITAL_COMMUNITY)
Admission: RE | Admit: 2019-06-14 | Discharge: 2019-06-14 | Disposition: A | Discharge status: Home / Self Care | Payer: Federal, State, Local not specified - PPO | Source: Ambulatory Visit | Attending: General Surgery | Admitting: General Surgery

## 2019-06-14 ENCOUNTER — Other Ambulatory Visit: Payer: Self-pay | Admitting: Nurse Practitioner

## 2019-06-14 DIAGNOSIS — Z20822 Contact with and (suspected) exposure to covid-19: Secondary | ICD-10-CM | POA: Insufficient documentation

## 2019-06-14 DIAGNOSIS — Z01812 Encounter for preprocedural laboratory examination: Secondary | ICD-10-CM | POA: Diagnosis not present

## 2019-06-14 LAB — SARS CORONAVIRUS 2 (TAT 6-24 HRS): SARS Coronavirus 2: NEGATIVE

## 2019-06-14 MED ORDER — LOSARTAN POTASSIUM 100 MG PO TABS: ORAL_TABLET | ORAL | 0 refills | Status: DC

## 2019-06-14 NOTE — Telephone Encounter (Signed)
I am happy to talk with her. I think if she is leaning toward a hysterectomy we should have her come in for an appointment to discuss this.

## 2019-06-14 NOTE — Telephone Encounter (Signed)
Patient called back requesting to speak with you via phone, I explained you are seeing patients and the time and I can relay the concerns. Patient said after speaking with you yesterday evening she doesn't feel comfortable waiting to have pelvic ultrasound in 3 months. She would like to proceed with surgery, reports mother had breast cancer and grandmother had uterine cancer, reports she is now starting to have discomfort in her back/pelvis. Patient said you may call her later if you wish to discuss with her.

## 2019-06-14 NOTE — Progress Notes (Signed)

## 2019-06-14 NOTE — Telephone Encounter (Signed)
Patient informed with below, transferred to appointment desk to schedule.

## 2019-06-18 ENCOUNTER — Ambulatory Visit (HOSPITAL_BASED_OUTPATIENT_CLINIC_OR_DEPARTMENT_OTHER): Payer: Federal, State, Local not specified - PPO | Admitting: Anesthesiology

## 2019-06-18 ENCOUNTER — Ambulatory Visit
Admission: RE | Admit: 2019-06-18 | Discharge: 2019-06-18 | Disposition: A | Discharge status: Home / Self Care | Payer: Federal, State, Local not specified - PPO | Source: Ambulatory Visit | Attending: General Surgery | Admitting: General Surgery

## 2019-06-18 ENCOUNTER — Ambulatory Visit (HOSPITAL_BASED_OUTPATIENT_CLINIC_OR_DEPARTMENT_OTHER)
Admission: RE | Admit: 2019-06-18 | Discharge: 2019-06-18 | Disposition: A | Discharge status: Home / Self Care | Payer: Federal, State, Local not specified - PPO | Attending: General Surgery | Admitting: General Surgery

## 2019-06-18 ENCOUNTER — Encounter (HOSPITAL_BASED_OUTPATIENT_CLINIC_OR_DEPARTMENT_OTHER): Payer: Self-pay | Admitting: General Surgery

## 2019-06-18 ENCOUNTER — Encounter (HOSPITAL_BASED_OUTPATIENT_CLINIC_OR_DEPARTMENT_OTHER)
Admission: RE | Disposition: A | Discharge status: Home / Self Care | Payer: Self-pay | Source: Home / Self Care | Attending: General Surgery

## 2019-06-18 ENCOUNTER — Other Ambulatory Visit: Payer: Self-pay

## 2019-06-18 DIAGNOSIS — F319 Bipolar disorder, unspecified: Secondary | ICD-10-CM | POA: Diagnosis not present

## 2019-06-18 DIAGNOSIS — N6021 Fibroadenosis of right breast: Secondary | ICD-10-CM

## 2019-06-18 DIAGNOSIS — K219 Gastro-esophageal reflux disease without esophagitis: Secondary | ICD-10-CM | POA: Diagnosis not present

## 2019-06-18 DIAGNOSIS — E7800 Pure hypercholesterolemia, unspecified: Secondary | ICD-10-CM | POA: Diagnosis not present

## 2019-06-18 DIAGNOSIS — E119 Type 2 diabetes mellitus without complications: Secondary | ICD-10-CM | POA: Insufficient documentation

## 2019-06-18 DIAGNOSIS — Z794 Long term (current) use of insulin: Secondary | ICD-10-CM | POA: Diagnosis not present

## 2019-06-18 DIAGNOSIS — N6489 Other specified disorders of breast: Secondary | ICD-10-CM | POA: Diagnosis present

## 2019-06-18 DIAGNOSIS — Z79899 Other long term (current) drug therapy: Secondary | ICD-10-CM | POA: Diagnosis not present

## 2019-06-18 DIAGNOSIS — I10 Essential (primary) hypertension: Secondary | ICD-10-CM | POA: Insufficient documentation

## 2019-06-18 DIAGNOSIS — E669 Obesity, unspecified: Secondary | ICD-10-CM | POA: Diagnosis not present

## 2019-06-18 DIAGNOSIS — Z6835 Body mass index (BMI) 35.0-35.9, adult: Secondary | ICD-10-CM | POA: Insufficient documentation

## 2019-06-18 DIAGNOSIS — N6089 Other benign mammary dysplasias of unspecified breast: Secondary | ICD-10-CM | POA: Insufficient documentation

## 2019-06-18 DIAGNOSIS — F418 Other specified anxiety disorders: Secondary | ICD-10-CM | POA: Insufficient documentation

## 2019-06-18 HISTORY — PX: BREAST LUMPECTOMY WITH RADIOACTIVE SEED LOCALIZATION: SHX6424

## 2019-06-18 LAB — GLUCOSE, CAPILLARY
Glucose-Capillary: 107 mg/dL — ABNORMAL HIGH (ref 70–99)
Glucose-Capillary: 106 mg/dL — ABNORMAL HIGH (ref 70–99)

## 2019-06-18 SURGERY — BREAST LUMPECTOMY WITH RADIOACTIVE SEED LOCALIZATION
Anesthesia: General | Site: Breast | Laterality: Right

## 2019-06-18 MED ORDER — CHLORHEXIDINE GLUCONATE CLOTH 2 % EX PADS: 6.0000 | MEDICATED_PAD | Freq: Once | CUTANEOUS | Status: DC

## 2019-06-18 MED ORDER — DEXAMETHASONE SODIUM PHOSPHATE 10 MG/ML IJ SOLN
INTRAMUSCULAR | Status: DC | PRN
  Administered 2019-06-18: 4 mg via INTRAVENOUS

## 2019-06-18 MED ORDER — GABAPENTIN 300 MG PO CAPS
300.0000 mg | ORAL_CAPSULE | ORAL | Status: AC
  Administered 2019-06-18: 300 mg via ORAL

## 2019-06-18 MED ORDER — GABAPENTIN 300 MG PO CAPS
ORAL_CAPSULE | ORAL | Status: AC
  Filled 2019-06-18: qty 1

## 2019-06-18 MED ORDER — FENTANYL CITRATE (PF) 100 MCG/2ML IJ SOLN
25.0000 ug | INTRAMUSCULAR | Status: DC | PRN
  Administered 2019-06-18: 25 ug via INTRAVENOUS

## 2019-06-18 MED ORDER — ACETAMINOPHEN 500 MG PO TABS
1000.0000 mg | ORAL_TABLET | ORAL | Status: AC
  Administered 2019-06-18: 1000 mg via ORAL

## 2019-06-18 MED ORDER — CELECOXIB 200 MG PO CAPS
200.0000 mg | ORAL_CAPSULE | ORAL | Status: AC
  Administered 2019-06-18: 200 mg via ORAL

## 2019-06-18 MED ORDER — MIDAZOLAM HCL 5 MG/5ML IJ SOLN
INTRAMUSCULAR | Status: DC | PRN
  Administered 2019-06-18: 2 mg via INTRAVENOUS

## 2019-06-18 MED ORDER — HYDROCODONE-ACETAMINOPHEN 5-325 MG PO TABS: 1.0000 | ORAL_TABLET | Freq: Four times a day (QID) | ORAL | 0 refills | Status: DC | PRN

## 2019-06-18 MED ORDER — ONDANSETRON HCL 4 MG/2ML IJ SOLN
INTRAMUSCULAR | Status: DC | PRN
  Administered 2019-06-18: 4 mg via INTRAVENOUS

## 2019-06-18 MED ORDER — PHENYLEPHRINE HCL (PRESSORS) 10 MG/ML IV SOLN
INTRAVENOUS | Status: DC | PRN
  Administered 2019-06-18: 80 ug via INTRAVENOUS

## 2019-06-18 MED ORDER — BUPIVACAINE HCL (PF) 0.25 % IJ SOLN
INTRAMUSCULAR | Status: DC | PRN
  Administered 2019-06-18: 20 mL

## 2019-06-18 MED ORDER — FENTANYL CITRATE (PF) 100 MCG/2ML IJ SOLN
INTRAMUSCULAR | Status: AC
  Filled 2019-06-18: qty 2

## 2019-06-18 MED ORDER — ACETAMINOPHEN 500 MG PO TABS
ORAL_TABLET | ORAL | Status: AC
  Filled 2019-06-18: qty 2

## 2019-06-18 MED ORDER — CEFAZOLIN SODIUM-DEXTROSE 2-4 GM/100ML-% IV SOLN
2.0000 g | INTRAVENOUS | Status: AC
  Administered 2019-06-18: 2 g via INTRAVENOUS

## 2019-06-18 MED ORDER — LACTATED RINGERS IV SOLN: INTRAVENOUS | Status: DC

## 2019-06-18 MED ORDER — CEFAZOLIN SODIUM-DEXTROSE 2-4 GM/100ML-% IV SOLN
INTRAVENOUS | Status: AC
  Filled 2019-06-18: qty 100

## 2019-06-18 MED ORDER — MIDAZOLAM HCL 2 MG/2ML IJ SOLN
INTRAMUSCULAR | Status: AC
  Filled 2019-06-18: qty 2

## 2019-06-18 MED ORDER — PROPOFOL 10 MG/ML IV BOLUS
INTRAVENOUS | Status: DC | PRN
  Administered 2019-06-18: 200 mg via INTRAVENOUS

## 2019-06-18 MED ORDER — FENTANYL CITRATE (PF) 100 MCG/2ML IJ SOLN
INTRAMUSCULAR | Status: DC | PRN
  Administered 2019-06-18 (×2): 25 ug via INTRAVENOUS
  Administered 2019-06-18: 50 ug via INTRAVENOUS

## 2019-06-18 MED ORDER — CELECOXIB 200 MG PO CAPS
ORAL_CAPSULE | ORAL | Status: AC
  Filled 2019-06-18: qty 1

## 2019-06-18 MED ORDER — LIDOCAINE HCL (CARDIAC) PF 100 MG/5ML IV SOSY
PREFILLED_SYRINGE | INTRAVENOUS | Status: DC | PRN
  Administered 2019-06-18: 80 mg via INTRAVENOUS

## 2019-06-18 MED ORDER — ONDANSETRON HCL 4 MG/2ML IJ SOLN: 4.0000 mg | Freq: Once | INTRAMUSCULAR | Status: DC | PRN

## 2019-06-18 MED ORDER — OXYCODONE HCL 5 MG PO TABS: 5.0000 mg | ORAL_TABLET | Freq: Once | ORAL | Status: DC | PRN

## 2019-06-18 MED ORDER — OXYCODONE HCL 5 MG/5ML PO SOLN: 5.0000 mg | Freq: Once | ORAL | Status: DC | PRN

## 2019-06-18 SURGICAL SUPPLY — 42 items
ADH SKN CLS APL DERMABOND .7 (GAUZE/BANDAGES/DRESSINGS) ×2
APL PRP STRL LF DISP 70% ISPRP (MISCELLANEOUS) ×2
BLADE SURG 15 STRL LF DISP TIS (BLADE) ×2 IMPLANT
BLADE SURG 15 STRL SS (BLADE) ×4
CANISTER SUCT 1200ML W/VALVE (MISCELLANEOUS) ×4 IMPLANT
CHLORAPREP W/TINT 26 (MISCELLANEOUS) ×4 IMPLANT
CLIP VESOCCLUDE SM WIDE 6/CT (CLIP) IMPLANT
COVER BACK TABLE 60X90IN (DRAPES) ×4 IMPLANT
COVER MAYO STAND STRL (DRAPES) ×4 IMPLANT
COVER PROBE W GEL 5X96 (DRAPES) ×2 IMPLANT
COVER WAND RF STERILE (DRAPES) IMPLANT
DECANTER SPIKE VIAL GLASS SM (MISCELLANEOUS) ×4 IMPLANT
DERMABOND ADVANCED (GAUZE/BANDAGES/DRESSINGS) ×2
DERMABOND ADVANCED .7 DNX12 (GAUZE/BANDAGES/DRESSINGS) ×2 IMPLANT
DRAPE LAPAROSCOPIC ABDOMINAL (DRAPES) ×4 IMPLANT
DRAPE UTILITY XL STRL (DRAPES) ×4 IMPLANT
ELECT COATED BLADE 2.86 ST (ELECTRODE) ×4 IMPLANT
ELECT REM PT RETURN 9FT ADLT (ELECTROSURGICAL) ×4
ELECTRODE REM PT RTRN 9FT ADLT (ELECTROSURGICAL) ×2 IMPLANT
GLOVE BIO SURGEON STRL SZ7.5 (GLOVE) ×4 IMPLANT
GOWN STRL REUS W/ TWL LRG LVL3 (GOWN DISPOSABLE) ×4 IMPLANT
GOWN STRL REUS W/TWL LRG LVL3 (GOWN DISPOSABLE) ×8
ILLUMINATOR WAVEGUIDE N/F (MISCELLANEOUS) IMPLANT
KIT MARKER MARGIN INK (KITS) ×2 IMPLANT
LIGHT WAVEGUIDE WIDE FLAT (MISCELLANEOUS) IMPLANT
NDL HYPO 25X1 1.5 SAFETY (NEEDLE) ×2 IMPLANT
NEEDLE HYPO 25X1 1.5 SAFETY (NEEDLE) ×4 IMPLANT
NS IRRIG 1000ML POUR BTL (IV SOLUTION) ×2 IMPLANT
PENCIL SMOKE EVACUATOR (MISCELLANEOUS) ×4 IMPLANT
SET BASIN DAY SURGERY F.S. (CUSTOM PROCEDURE TRAY) ×4 IMPLANT
SLEEVE SCD COMPRESS KNEE MED (MISCELLANEOUS) ×4 IMPLANT
SPONGE LAP 18X18 RF (DISPOSABLE) ×4 IMPLANT
STAPLER VISISTAT 35W (STAPLE) IMPLANT
SUT MON AB 4-0 PC3 18 (SUTURE) ×4 IMPLANT
SUT SILK 2 0 SH (SUTURE) IMPLANT
SUT VICRYL 3-0 CR8 SH (SUTURE) ×4 IMPLANT
SYR CONTROL 10ML LL (SYRINGE) ×4 IMPLANT
TOWEL GREEN STERILE FF (TOWEL DISPOSABLE) ×4 IMPLANT
TRAY FAXITRON CT DISP (TRAY / TRAY PROCEDURE) ×2 IMPLANT
TUBE CONNECTING 20'X1/4 (TUBING)
TUBE CONNECTING 20X1/4 (TUBING) ×2 IMPLANT
YANKAUER SUCT BULB TIP NO VENT (SUCTIONS) ×4 IMPLANT

## 2019-06-18 NOTE — Anesthesia Postprocedure Evaluation (Signed)
Anesthesia Post Note  Patient: Morgan [name]  Procedure(s) Performed: RIGHT BREAST RADIOACTIVE SEED LOCALIZATION LUMPECTOMY (Right Breast)     Patient location during evaluation: PACU Anesthesia Type: General Level of consciousness: awake and alert Pain management: pain level controlled Vital Signs Assessment: post-procedure vital signs reviewed and stable Respiratory status: spontaneous breathing, nonlabored ventilation and respiratory function stable Cardiovascular status: blood pressure returned to baseline and stable Postop Assessment: no apparent nausea or vomiting Anesthetic complications: no    Last Vitals:  Vitals:   06/18/19 1630 06/18/19 1650  BP: 107/69 115/68  Pulse: 79 85  Resp: 18 18  Temp:  36.6 C  SpO2: 95% 99%    Last Pain:  Vitals:   06/18/19 1650  TempSrc:   PainSc: 2                  Audry Pili

## 2019-06-18 NOTE — H&P (Signed)
Morgan [name]  Location: [facility] [name] #: [id number] DOB: 03/19/1965 Married / Language: English / Race: White Female   History of Present Illness  Morgan [name] is a 54 year old female who presents for a follow-up for Breast mass. We're asked to see Morgan [name] in consultation by Dr. Michiel Cowboy to evaluate her for a complex sclerosing lesion in Morgan right breast. Morgan [name] is a 54 year old white female who has a history of complex sclerosing lesion in Morgan lower outer right breast that was removed about 5 years ago. On her mammogram from 2020 she was found to have a new area of distortion in Morgan upper outer quadrant of Morgan right breast. This was biopsied and came back as a complex sclerosing lesion. Morgan recommendation was to have this removed. She did not have any follow-up after Morgan mammogram. She denies any breast pain or discharge from Morgan nipple. She does not smoke.   Past Surgical History  Breast Biopsy  Right. Colon Polyp Removal - Colonoscopy  Spinal Surgery - Lower Back  Spinal Surgery - Neck   Diagnostic Studies History  Colonoscopy  within last year never Mammogram  1-3 years ago within last year Pap Smear  1-5 years ago  Allergies  CLINDAMYCIN  Rash. lamoTRIgine *ANTICONVULSANTS*  Rash. No Known Drug Allergies   Medication History  Xigduo XR (10-500MG  Tablet ER 24HR, Oral) Active. NovoLOG FlexPen (100UNIT/ML Soln Pen-inj, Subcutaneous) Active. Losartan Potassium (100MG  Tablet, Oral) Active. NovoLOG (100UNIT/ML Solution, Subcutaneous) Active. Omeprazole (20MG  Capsule DR, Oral) Active. ARIPiprazole (15MG  Tablet, Oral) Active. DULoxetine HCl (60MG  Capsule DR Part, Oral) Active. Atorvastatin Calcium (40MG  Tablet, Oral) Active. Cinnamon (500MG  Tablet, Oral) Active. Abilify (10MG  Tablet, Oral) Active. PROzac (20MG  Capsule, Oral) Active. Medications Reconciled  Pregnancy / Birth History  Age at menarche  66  years. Age of menopause  <45 44-50 Gravida  1 Maternal age  43-35 Para  1  Other Problems  Back Pain  Depression  Diabetes Mellitus  Gastroesophageal Reflux Disease  High blood pressure  Hypercholesterolemia     Review of Systems  General Not Present- Appetite Loss, Chills, Fatigue, Fever, Night Sweats, Weight Gain and Weight Loss. Skin Not Present- Change in Wart/Mole, Dryness, Hives, Jaundice, New Lesions, Non-Healing Wounds, Rash and Ulcer. HEENT Present- Seasonal Allergies and Wears glasses/contact lenses. Not Present- Earache, Hearing Loss, Hoarseness, Nose Bleed, Oral Ulcers, Ringing in Morgan Ears, Sinus Pain, Sore Throat, Visual Disturbances and Yellow Eyes. Breast Not Present- Breast Mass, Breast Pain, Nipple Discharge and Skin Changes. Cardiovascular Not Present- Chest Pain, Difficulty Breathing Lying Down, Leg Cramps, Palpitations, Rapid Heart Rate, Shortness of Breath and Swelling of Extremities. Gastrointestinal Not Present- Abdominal Pain, Bloating, Bloody Stool, Change in Bowel Habits, Chronic diarrhea, Constipation, Difficulty Swallowing, Excessive gas, Gets full quickly at meals, Hemorrhoids, Indigestion, Nausea, Rectal Pain and Vomiting. Female Genitourinary Not Present- Frequency, Nocturia, Painful Urination, Pelvic Pain and Urgency. Musculoskeletal Present- Back Pain. Not Present- Joint Pain, Joint Stiffness, Muscle Pain, Muscle Weakness and Swelling of Extremities. Neurological Not Present- Decreased Memory, Fainting, Headaches, Numbness, Seizures, Tingling, Tremor, Trouble walking and Weakness. Psychiatric Present- Bipolar. Not Present- Anxiety, Change in Sleep Pattern, Depression, Fearful and Frequent crying. Endocrine Not Present- Cold Intolerance, Excessive Hunger, Hair Changes, Heat Intolerance, Hot flashes and New Diabetes. Hematology Not Present- Blood Thinners, Easy Bruising, Excessive bleeding, Gland problems, HIV and Persistent Infections.  Vitals   Weight: 180.25 lb Height: 61in Body Surface Area: 1.81 m Body Mass Index: 34.06 kg/m  Temp.: 65F  Pulse:  97 (Regular)  P.OX: 97% (Room air) BP: 116/80(Sitting, Left Arm, Standard)       Physical Exam  General Mental Status-Alert. General Appearance-Consistent with stated age. Hydration-Well hydrated. Voice-Normal.  Head and Neck Head-normocephalic, atraumatic with no lesions or palpable masses. Trachea-midline. Thyroid Gland Characteristics - normal size and consistency.  Eye Eyeball - Bilateral-Extraocular movements intact. Sclera/Conjunctiva - Bilateral-No scleral icterus.  Chest and Lung Exam Chest and lung exam reveals -quiet, even and easy respiratory effort with no use of accessory muscles and on auscultation, normal breath sounds, no adventitious sounds and normal vocal resonance. Inspection Chest Wall - Normal. Back - normal.  Breast Note: There is no palpable mass in either breast. There is no palpable axillary, supraclavicular, or cervical lymphadenopathy   Cardiovascular Cardiovascular examination reveals -normal heart sounds, regular rate and rhythm with no murmurs and normal pedal pulses bilaterally.  Abdomen Inspection Inspection of Morgan abdomen reveals - No Hernias. Skin - Scar - no surgical scars. Palpation/Percussion Palpation and Percussion of Morgan abdomen reveal - Soft, Non Tender, No Rebound tenderness, No Rigidity (guarding) and No hepatosplenomegaly. Auscultation Auscultation of Morgan abdomen reveals - Bowel sounds normal.  Neurologic Neurologic evaluation reveals -alert and oriented x 3 with no impairment of recent or remote memory. Mental Status-Normal.  Musculoskeletal Normal Exam - Left-Upper Extremity Strength Normal and Lower Extremity Strength Normal. Normal Exam - Right-Upper Extremity Strength Normal and Lower Extremity Strength Normal.  Lymphatic Head & Neck  General Head & Neck  Lymphatics: Bilateral - Description - Normal. Axillary  General Axillary Region: Bilateral - Description - Normal. Tenderness - Non Tender. Femoral & Inguinal  Generalized Femoral & Inguinal Lymphatics: Bilateral - Description - Normal. Tenderness - Non Tender.    Assessment & Plan SCLEROSING ADENOSIS OF BREAST, RIGHT (N60.21) Impression: Morgan [name] was found to have a small area of complex sclerosing lesion in Morgan upper outer quadrant of Morgan right breast last year on mammogram. She has not had any follow-up since then. Because this lesion has a suspicious appearance and because of its slightly increased risk of developing breast cancer at Morgan care would be reasonable to remove Morgan area. Since it has been over a year since her last mammogram a think this should be repeated in case something new shows up that needs to be dealt with at Morgan time of Morgan surgery. If Morgan mammogram looks Morgan same then I think it would be safe to proceed with radioactive seed localized right breast lumpectomy. I have discussed with her in detail Morgan risks and benefits of Morgan operation as well as some of Morgan technical aspects and she understands and wishes to proceed. This [name] encounter took 45 minutes today to perform Morgan following: take history, perform exam, review outside records, interpret imaging, counsel Morgan [name] on their diagnosis and document encounter, findings & plan in Morgan EHR Current Plans DIAG DIGITAL MAMMOGRAM BILAT (77066)(ACR 0 )(DSN [id number])(G-Code G1004(MG)) (Clinical Scenarios: No content available for this procedure)

## 2019-06-18 NOTE — Anesthesia Procedure Notes (Signed)
Procedure Name: LMA Insertion Date/Time: 06/18/2019 3:04 PM Performed by: Maryella Shivers, CRNA Pre-anesthesia Checklist: Patient identified, Emergency Drugs available, Suction available and Patient being monitored Patient Re-evaluated:Patient Re-evaluated prior to induction Oxygen Delivery Method: Circle system utilized Preoxygenation: Pre-oxygenation with 100% oxygen Induction Type: IV induction Ventilation: Mask ventilation without difficulty LMA: LMA inserted LMA Size: 4.0 Number of attempts: 1 Airway Equipment and Method: Bite block Placement Confirmation: positive ETCO2 Tube secured with: Tape Dental Injury: Teeth and Oropharynx as per pre-operative assessment

## 2019-06-18 NOTE — Anesthesia Preprocedure Evaluation (Addendum)
Anesthesia Evaluation  Patient identified by MRN, date of birth, ID band Patient awake    Reviewed: Allergy & Precautions, NPO status , Patient's Chart, lab work & pertinent test results  History of Anesthesia Complications Negative for: history of anesthetic complications  Airway Mallampati: II  TM Distance: >3 FB Neck ROM: Full    Dental  (+) Dental Advisory Given, Chipped   Pulmonary neg pulmonary ROS,    Pulmonary exam normal        Cardiovascular hypertension, Pt. on medications Normal cardiovascular exam     Neuro/Psych  Headaches, PSYCHIATRIC DISORDERS Anxiety Depression Bipolar Disorder    GI/Hepatic Neg liver ROS, GERD  Medicated and Controlled,  Endo/Other  diabetes, Type 2, Insulin Dependent Obesity   Renal/GU negative Renal ROS     Musculoskeletal negative musculoskeletal ROS (+)   Abdominal   Peds  Hematology negative hematology ROS (+)   Anesthesia Other Findings Covid neg 4/29   Reproductive/Obstetrics                            Anesthesia Physical Anesthesia Plan  ASA: II  Anesthesia Plan: General   Post-op Pain Management:    Induction: Intravenous  PONV Risk Score and Plan: 3 and Treatment may vary due to age or medical condition, Ondansetron, Dexamethasone and Midazolam  Airway Management Planned: LMA  Additional Equipment: None  Intra-op Plan:   Post-operative Plan: Extubation in OR  Informed Consent: I have reviewed the patients History and Physical, chart, labs and discussed the procedure including the risks, benefits and alternatives for the proposed anesthesia with the patient or authorized representative who has indicated his/her understanding and acceptance.     Dental advisory given  Plan Discussed with: CRNA and Anesthesiologist  Anesthesia Plan Comments:        Anesthesia Quick Evaluation

## 2019-06-18 NOTE — Op Note (Signed)
06/18/2019  3:43 PM  PATIENT:  Morgan [name]  54 y.o. female  PRE-OPERATIVE DIAGNOSIS:  RIGHT BREAST COMPLEX SCLEROSING LESION  POST-OPERATIVE DIAGNOSIS:  RIGHT BREAST COMPLEX SCLEROSING LESION  PROCEDURE:  Procedure(s): RIGHT BREAST RADIOACTIVE SEED LOCALIZATION LUMPECTOMY (Right)  SURGEON:  Surgeon(s) and Role:    * Jovita Kussmaul, MD - Primary  PHYSICIAN ASSISTANT:   ASSISTANTS: none   ANESTHESIA:   local and general  EBL:  minimal   BLOOD ADMINISTERED:none  DRAINS: none   LOCAL MEDICATIONS USED:  MARCAINE     SPECIMEN:  Source of Specimen:  right breast tissue  DISPOSITION OF SPECIMEN:  PATHOLOGY  COUNTS:  YES  TOURNIQUET:  * No tourniquets in log *  DICTATION: .Dragon Dictation   After informed consent was obtained the patient was brought to the operating room and placed in the supine position on the operating table.  After adequate induction of general anesthesia the patient's right breast was prepped with ChloraPrep, allowed to dry, and draped in usual sterile manner.  An appropriate timeout was performed.  Previously an I-125 seed was placed in the outer aspect of the right breast to mark an area of complex sclerosing lesion.  The neoprobe was set to I-125 in the area of radioactivity was readily identified.  The area around this was infiltrated with quarter percent Marcaine.  A curvilinear incision was made along the outer aspect of the right breast with a 15 blade knife.  The incision was carried through the skin and subcutaneous tissue sharply with the electrocautery.  The dissection was then carried out towards the radioactive seed under the direction of the neoprobe.  Once I more closely approached the radioactive seed I then removed a circular portion of breast tissue sharply around the radioactive seed while checking the area of radioactivity frequently.  Once the specimen was removed it was oriented with the appropriate paint colors.  A specimen radiograph was  obtained that showed the clip and seed to be within the specimen.  The specimen was then sent to pathology for further evaluation.  Hemostasis was achieved using the Bovie electrocautery.  The wound was irrigated with saline and infiltrated with quarter percent Marcaine.  The deep layer of the wound was then closed with layers of interrupted 3-0 Vicryl stitches.  The skin was closed with a running 4-0 Monocryl subcuticular stitch.  Dermabond dressings were applied.  The patient tolerated the procedure well.  At the end of the case all needle sponge and instrument counts were correct.  The patient was then awakened and taken to recovery in stable condition.  PLAN OF CARE: Discharge to home after PACU  PATIENT DISPOSITION:  PACU - hemodynamically stable.   Delay start of Pharmacological VTE agent (>24hrs) due to surgical blood loss or risk of bleeding: not applicable

## 2019-06-18 NOTE — Discharge Instructions (Signed)
No Tylenol until 7:30 PM on 06/18/2019 No Ibuprofen until 9:30 PM on 06/18/2019     Post Anesthesia Home Care Instructions  Activity: Get plenty of rest for the remainder of the day. A responsible individual must stay with you for 24 hours following the procedure.  For the next 24 hours, DO NOT: -Drive a car -Paediatric nurse -Drink alcoholic beverages -Take any medication unless instructed by your physician -Make any legal decisions or sign important papers.  Meals: Start with liquid foods such as gelatin or soup. Progress to regular foods as tolerated. Avoid greasy, spicy, heavy foods. If nausea and/or vomiting occur, drink only clear liquids until the nausea and/or vomiting subsides. Call your physician if vomiting continues.  Special Instructions/Symptoms: Your throat may feel dry or sore from the anesthesia or the breathing tube placed in your throat during surgery. If this causes discomfort, gargle with warm salt water. The discomfort should disappear within 24 hours.  If you had a scopolamine patch placed behind your ear for the management of post- operative nausea and/or vomiting:  1. The medication in the patch is effective for 72 hours, after which it should be removed.  Wrap patch in a tissue and discard in the trash. Wash hands thoroughly with soap and water. 2. You may remove the patch earlier than 72 hours if you experience unpleasant side effects which may include dry mouth, dizziness or visual disturbances. 3. Avoid touching the patch. Wash your hands with soap and water after contact with the patch.      Call your surgeon if you experience:   1.  Fever over 101.0. 2.  Inability to urinate. 3.  Nausea and/or vomiting. 4.  Extreme swelling or bruising at the surgical site. 5.  Continued bleeding from the incision. 6.  Increased pain, redness or drainage from the incision. 7.  Problems related to your pain medication. 8.  Any problems and/or concerns

## 2019-06-18 NOTE — Transfer of Care (Signed)
Immediate Anesthesia Transfer of Care Note  Patient: Morgan [name]  Procedure(s) Performed: RIGHT BREAST RADIOACTIVE SEED LOCALIZATION LUMPECTOMY (Right Breast)  Patient Location: PACU  Anesthesia Type:General  Level of Consciousness: sedated  Airway & Oxygen Therapy: Patient Spontanous Breathing and Patient connected to face mask oxygen  Post-op Assessment: Report given to RN and Post -op Vital signs reviewed and stable  Post vital signs: Reviewed and stable  Last Vitals:  Vitals Value Taken Time  BP 111/67 06/18/19 1553  Temp    Pulse 89 06/18/19 1555  Resp 13 06/18/19 1555  SpO2 100 % 06/18/19 1555  Vitals shown include unvalidated device data.  Last Pain:  Vitals:   06/18/19 1321  TempSrc: Tympanic         Complications: No apparent anesthesia complications

## 2019-06-18 NOTE — Interval H&P Note (Signed)
History and Physical Interval Note:  06/18/2019 2:48 PM  Morgan [name]  has presented today for surgery, with the diagnosis of RIGHT BREAST COMPLEX SCLEROSING LESION.  The various methods of treatment have been discussed with the patient and family. After consideration of risks, benefits and other options for treatment, the patient has consented to  Procedure(s): RIGHT BREAST RADIOACTIVE SEED LOCALIZATION LUMPECTOMY (Right) as a surgical intervention.  The patient's history has been reviewed, patient examined, no change in status, stable for surgery.  I have reviewed the patient's chart and labs.  Questions were answered to the patient's satisfaction.     [name]

## 2019-06-19 ENCOUNTER — Other Ambulatory Visit: Payer: Self-pay

## 2019-06-19 ENCOUNTER — Encounter: Payer: Self-pay | Admitting: *Deleted

## 2019-06-20 ENCOUNTER — Ambulatory Visit: Payer: Federal, State, Local not specified - PPO | Admitting: Obstetrics and Gynecology

## 2019-06-20 ENCOUNTER — Encounter: Payer: Self-pay | Admitting: Obstetrics and Gynecology

## 2019-06-20 VITALS — BP 122/76

## 2019-06-20 DIAGNOSIS — R9389 Abnormal findings on diagnostic imaging of other specified body structures: Secondary | ICD-10-CM | POA: Diagnosis not present

## 2019-06-20 DIAGNOSIS — Z8639 Personal history of other endocrine, nutritional and metabolic disease: Secondary | ICD-10-CM | POA: Diagnosis not present

## 2019-06-20 DIAGNOSIS — N83201 Unspecified ovarian cyst, right side: Secondary | ICD-10-CM

## 2019-06-20 DIAGNOSIS — N8500 Endometrial hyperplasia, unspecified: Secondary | ICD-10-CM

## 2019-06-20 DIAGNOSIS — N83202 Unspecified ovarian cyst, left side: Secondary | ICD-10-CM

## 2019-06-20 DIAGNOSIS — R1030 Lower abdominal pain, unspecified: Secondary | ICD-10-CM

## 2019-06-20 DIAGNOSIS — N950 Postmenopausal bleeding: Secondary | ICD-10-CM

## 2019-06-20 NOTE — Progress Notes (Signed)
Morgan [name] September 02, 1965 [id number]  SUBJECTIVE:  54 y.o. G74P1001 female with history of endometriosis and cesarean delivery presents for further discussion of management of simple endometrial hyperplasia without atypia, found on office biopsy performed due to postmenopausal bleeding after going about 8 to 9 years without a period, in addition to small bilateral ovarian cysts, with the left ovarian cyst noted to have some complex features.  Review of her prior pelvic ultrasound images from 03/2018 indicated she did have what appeared to be the same cyst there at that time as well, measuring about 1 cm at that time (although that was not noted in her ultrasound report).  She did have tumor markers drawn after discovering the ovarian cyst, CA-125 and CEA were normal.  Her inhibin B was in a mildly elevated range.  Informal chart review of the case with our GYN oncologist indicated a low suspicion for granulosa cell tumor.  The patient was offered the option of serial ultrasound surveillance of the ovarian cyst and inhibin B levels, but she also wanted to discuss hysterectomy.  She has not had any further vaginal bleeding since that recent episode the other month.  Her husband is present for the encounter today.    Current Outpatient Medications  Medication Sig Dispense Refill  . ARIPiprazole (ABILIFY) 15 MG tablet Take 1 tablet by mouth at bedtime.    Marland Kitchen atorvastatin (LIPITOR) 40 MG tablet Take 1 tablet (40 mg total) by mouth daily. 90 tablet 3  . celecoxib (CELEBREX) 100 MG capsule Take 100 mg by mouth daily.    . DULoxetine (CYMBALTA) 60 MG capsule Take 60 mg by mouth at bedtime.  5  . Insulin Syringes, Disposable, U-100 1 ML MISC Use as instructed. Inject into the skin three times per day 200 each 3  . losartan (COZAAR) 100 MG tablet Take 1 tablet daily by mouth. 30 tablet 0  . omeprazole (PRILOSEC) 20 MG capsule Take 20 mg by mouth at bedtime.     Marland Kitchen XIGDUO XR 10-500 MG TB24 TAKE 1 TABLET BY MOUTH  EVERY DAY 90 tablet 0  . HYDROcodone-acetaminophen (NORCO/VICODIN) 5-325 MG tablet Take 1-2 tablets by mouth every 6 (six) hours as needed for moderate pain or severe pain. (Patient not taking: Reported on 06/20/2019) 10 tablet 0  . insulin aspart (NOVOLOG) 100 UNIT/ML injection Inject 5 Units into the skin 3 (three) times daily before meals. (Patient taking differently: Inject 10 Units into the skin 3 (three) times daily before meals. ) 30 mL 2   No current facility-administered medications for this visit.   Allergies: Clindamycin/lincomycin and Lamotrigine  Patient's last menstrual period was 05/12/2011.  Past medical history,surgical history, problem list, medications, allergies, family history and social history were all reviewed and documented as reviewed in the EPIC chart.  ROS:  Feeling well. No dyspnea or chest pain on exertion. + lower abdominal pain, no change in bowel habits, no black or bloody stools.  No urinary tract symptoms. GYN ROS: no abnormal bleeding, pelvic pain or discharge, no breast pain or new or enlarging lumps on self exam. No neurological complaints.   OBJECTIVE:  BP 122/76   LMP 05/12/2011 Comment: no period in 7 years  The patient appears well, alert, oriented x 3, in no distress. Abdomen: Low transverse Pfannenstiel scar, infraumbilical incision, PELVIC EXAM: Deferred as this was previously performed at her last visit   ASSESSMENT:  54 y.o. G1P1001 here for discussion of hysterectomy for endometrial hyperplasia (simple no atypia), simple right  ovarian cysts, complex left ovarian cyst  PLAN:  We again reviewed conservative management options to include surveillance of the endometrial hyperplasia with regular biopsies every 3 to 6 months with possible addition of a progestin to help control or reverse these hyperplastic changes of the endometrium.  We could proceed with hysteroscopy D&C for further evaluation of the endometrium just to make sure there are not any  more advanced hyperplasia changes in the uterus.  We could watch the ovarian cyst with an ultrasound in 3 months and repeat an inhibin B level at that time and continue with serial surveillance.  We discussed that we do not know the etiology of the ovarian cyst, but on imaging it could be consistent with an endometrioma, but we cannot definitively rule out neoplasm/cancer either without pathology analysis of the specimen.  She is not comfortable with that uncertainty.  Morgan [name] is more interested in proceeding with definitive management of these issues with a hysterectomy.  This is reasonable given the multiple issues including endometrial hyperplasia and the complex left ovarian cyst.  We discussed one possibility being that if she has a malignant cyst on her ovary, we would not know this diagnosis until after she is out of the operating room and recovering from surgery, which would lead to suboptimal staging and therefore suboptimal treatment.  It is not thought to be highly likely that she has a granulosa cell tumor, but I did offer her the option of seeking a formal opinion with gynecologic oncology and they could possibly perform her surgery and be equipped to perform intraoperative frozen section and then proceeding with staging if indicated.  She understands this concept and says she would like to proceed with hysterectomy with general gynecology without the consultation citing the fact that the low likelihood of malignancy.  I would propose a robotic assisted total laparoscopic hysterectomy with bilateral salpingo-oophorectomy followed by cystoscopy.  She has a history of cesarean section and endometriosis and could have significant pelvic adhesion tissue.  Before proceeding with surgery I will have staff reach out to her as I would like her to recheck hemoglobin A1c to make sure it is in a reasonable range below 8% as she did have an elevation when it was last checked in October 2020.  We will also check  an [place] level to better confirm her menopausal status.  I discussed the hysterectomy procedure in detail with the patient including the length of time anticipated to perform the procedure, the expectation for overnight stay in the hospital and subsequent discharge the following day, and postoperative convalescence and recovery expectations.  I discussed that she will need to plan to be off of work for potentially up to 6 weeks but may be able go back sooner a limited capacity keeping in mind lifting restrictions of 10 to 20 lb and no repetitive squatting, bending, or straining maneuvers.  Pelvic rest restriction was also reviewed.  The patient is aware that hysterectomy is considered to be a major surgical procedure.  There are risks of infection, bleeding, possibility of needing a blood transfusion, injury to bowel, bladder, major pelvic vessels, ureter, nerves from positioning and or skin irritation, and deep venous thrombosis. The risk of inadvertent injury to internal organs either immediately recognized or delay in recognition necessitating major exploratory reparative surgeries and future reparative surgeries including bowel resection, ostomy formation, bladder repair, ureteral damage repair was discussed with her.  General anesthesia also has risks including myocardial infarction, stroke, and death.  Common scenarios  for complications from surgery were reviewed with the patient including focus on bladder and/or ureteral injuries.  Incisional complications to include opening and draining of incisions and closure by secondary intention, dehiscence, and hernia formation were reviewed. Generally the complication rate is less than 1%.  Conversion to laparotomy may be deemed necessary at the time of the procedure, which if performed would result in longer hospital stay, and more postoperative pain and healing time.    I will have staff reach out to her to help schedule the surgery and preoperative medical  clearance with her primary care provider.  All questions were answered to her satisfaction.   Joseph Pierini MD 06/20/19

## 2019-06-21 ENCOUNTER — Telehealth: Payer: Self-pay

## 2019-06-21 LAB — SURGICAL PATHOLOGY

## 2019-06-21 NOTE — Telephone Encounter (Signed)
I called patient about scheduling Robotic TLH,BSO. I am waiting on July schedule and will call her within the week to get her scheduled in July hopefully.  We reviewed her ins benefits and her estimated surgery prepymt due by week before surgery.  Dr. Delilah Shan had noticed after the visit that 2020 hgbA1c was 10 and wants patient to have it rechecked (if not checked in last 3 mos.).  Patient said that it was checked last week and was 6.8.  She will bring that result to [place] when she comes to have [facility] level drawn tomorrow as Dr. Allena Napoleon requested as well.  I cancelled the order for HgbA1c since already performed.  I, also, discussed with her need to get medical clearance from her PCP for her surgery. She said she is in the process of changing doctors and has appt in June with new MD. I told her that would be fine as long as we have medical clearance before surgery.

## 2019-06-22 ENCOUNTER — Other Ambulatory Visit: Payer: Federal, State, Local not specified - PPO

## 2019-06-25 NOTE — Telephone Encounter (Signed)
I noticed today that patient reschedule lab appt from 5/7 to 06/29/19.  She is supposed to bring her HgbA1c result with her at that time. If less than 8.0 I will schedule her surgery at that time. I am holding time for her in July.

## 2019-06-29 ENCOUNTER — Other Ambulatory Visit: Payer: Federal, State, Local not specified - PPO

## 2019-06-29 ENCOUNTER — Other Ambulatory Visit: Payer: Self-pay

## 2019-06-29 DIAGNOSIS — N950 Postmenopausal bleeding: Secondary | ICD-10-CM

## 2019-06-29 DIAGNOSIS — N8500 Endometrial hyperplasia, unspecified: Secondary | ICD-10-CM

## 2019-06-29 DIAGNOSIS — R9389 Abnormal findings on diagnostic imaging of other specified body structures: Secondary | ICD-10-CM

## 2019-06-30 LAB — FOLLICLE STIMULATING HORMONE: FSH: 44.3 m[IU]/mL

## 2019-07-05 ENCOUNTER — Telehealth: Payer: Self-pay

## 2019-07-05 NOTE — Telephone Encounter (Signed)
Patient called and asked me to cancel her Robotic Assisted TLH, BSO.  She said she was seen my a physician at [facility] and they are going to do her surgery in the next few weeks.    Surgery, pre op visit and Covid screen test was cancelled.  Dr. Delilah Shan notified.

## 2019-07-25 DIAGNOSIS — Z90710 Acquired absence of both cervix and uterus: Secondary | ICD-10-CM | POA: Insufficient documentation

## 2019-07-31 ENCOUNTER — Ambulatory Visit: Payer: Federal, State, Local not specified - PPO | Admitting: Physician Assistant

## 2019-07-31 ENCOUNTER — Encounter: Payer: Self-pay | Admitting: Physician Assistant

## 2019-07-31 ENCOUNTER — Other Ambulatory Visit: Payer: Self-pay

## 2019-07-31 VITALS — BP 113/68 | HR 82 | Temp 98.1°F | Ht 61.0 in | Wt 185.1 lb

## 2019-07-31 DIAGNOSIS — E1159 Type 2 diabetes mellitus with other circulatory complications: Secondary | ICD-10-CM

## 2019-07-31 DIAGNOSIS — Z7689 Persons encountering health services in other specified circumstances: Secondary | ICD-10-CM | POA: Diagnosis not present

## 2019-07-31 DIAGNOSIS — I152 Hypertension secondary to endocrine disorders: Secondary | ICD-10-CM

## 2019-07-31 DIAGNOSIS — E785 Hyperlipidemia, unspecified: Secondary | ICD-10-CM

## 2019-07-31 DIAGNOSIS — Z90710 Acquired absence of both cervix and uterus: Secondary | ICD-10-CM

## 2019-07-31 DIAGNOSIS — I10 Essential (primary) hypertension: Secondary | ICD-10-CM

## 2019-07-31 DIAGNOSIS — E1169 Type 2 diabetes mellitus with other specified complication: Secondary | ICD-10-CM | POA: Diagnosis not present

## 2019-07-31 DIAGNOSIS — K760 Fatty (change of) liver, not elsewhere classified: Secondary | ICD-10-CM

## 2019-07-31 DIAGNOSIS — F319 Bipolar disorder, unspecified: Secondary | ICD-10-CM

## 2019-07-31 DIAGNOSIS — Z8742 Personal history of other diseases of the female genital tract: Secondary | ICD-10-CM

## 2019-07-31 DIAGNOSIS — Z794 Long term (current) use of insulin: Secondary | ICD-10-CM

## 2019-07-31 MED ORDER — XIGDUO XR 10-500 MG PO TB24: 1.0000 | ORAL_TABLET | Freq: Every day | ORAL | 1 refills | Status: DC

## 2019-07-31 MED ORDER — LOSARTAN POTASSIUM 100 MG PO TABS: ORAL_TABLET | ORAL | 1 refills | Status: DC

## 2019-07-31 MED ORDER — INSULIN ASPART 100 UNIT/ML ~~LOC~~ SOLN: 10.0000 [IU] | Freq: Three times a day (TID) | SUBCUTANEOUS | 2 refills | Status: DC

## 2019-07-31 NOTE — Patient Instructions (Signed)

## 2019-07-31 NOTE — Progress Notes (Signed)
New Patient Office Visit  Subjective:  Patient ID: Morgan [name], female    DOB: 04-Oct-1965  Age: 54 y.o. MRN: [id number]  CC:  Chief Complaint  Patient presents with   New Patient (Initial Visit)    HPI Morgan [name] presents to establish care and needs some medication refills today. She has no acute complaints. Reports she is transferring care because she has a difficult time getting refills with previous PCP. Pt has PHX of hypertension, diabetes mellitus, hyperlipidemia, nephrolithiasis, bipolar disorder, anxiety, cervical dysplasia, and GERD. Pt is followed by psychiatry for mood management.  She recently had a total hysterectomy due to endometrial hyperplasia and adnexal mass. Reports she is a little anxious waiting for pathology results. Family history is pertinent for breast cancer, uterine cancer, heart attack, diabetes, high cholesterol, hypertension, stroke, and depression.  Patient has never been a smoker, does not drink alcohol, and no recreational drug use.  HTN: Pt denies chest pain, palpitations, dizziness or lower extremity swelling. Taking medication as directed without side effects. Checks BP at home and readings range in 110s/60s. Pt follows a low salt diet.  Diabetes: Pt denies increased urination or thirst. Pt reports medication compliance. No hypoglycemic events. Checking glucose at home. FBS range in 110.   Past Medical History:  Diagnosis Date   Anxiety    Bipolar disorder ([place])    Breast mass    right   Cervical dysplasia    Diabetes mellitus without complication ([place])    Endometriosis    GERD (gastroesophageal reflux disease)    Hypercholesteremia    Hypertension    Migraine    Pelvic kidney    left   Postpartum depression    hx of    Past Surgical History:  Procedure Laterality Date   ABDOMINAL HYSTERECTOMY N/A    Phreesia 07/30/2019   BACK SURGERY  2014   BREAST LUMPECTOMY WITH RADIOACTIVE SEED LOCALIZATION Right 10/28/2014    Procedure: RIGHT BREAST LUMPECTOMY WITH RADIOACTIVE SEED LOCALIZATION;  Surgeon: [name], MD;  Location: [facility];  Service: General;  Laterality: Right;   BREAST LUMPECTOMY WITH RADIOACTIVE SEED LOCALIZATION Right 06/18/2019   Procedure: RIGHT BREAST RADIOACTIVE SEED LOCALIZATION LUMPECTOMY;  Surgeon: Jovita Kussmaul, MD;  Location: [facility];  Service: General;  Laterality: Right;   BREAST SURGERY N/A    Phreesia 07/30/2019   CESAREAN SECTION  03   COLONOSCOPY WITH PROPOFOL N/A 05/01/2018   Procedure: COLONOSCOPY WITH PROPOFOL;  Surgeon: [name], MD;  Location: [facility] ENDOSCOPY;  Service: Gastroenterology;  Laterality: N/A;   COLPOSCOPY     ESOPHAGOGASTRODUODENOSCOPY (EGD) WITH PROPOFOL N/A 05/01/2018   Procedure: ESOPHAGOGASTRODUODENOSCOPY (EGD) WITH PROPOFOL;  Surgeon: [name], MD;  Location: [facility] ENDOSCOPY;  Service: Gastroenterology;  Laterality: N/A;   ESOPHAGOGASTRODUODENOSCOPY (EGD) WITH PROPOFOL N/A 11/13/2018   Procedure: ESOPHAGOGASTRODUODENOSCOPY (EGD) WITH PROPOFOL;  Surgeon: [name], MD;  Location: [facility] ENDOSCOPY;  Service: Gastroenterology;  Laterality: N/A;   GYNECOLOGIC CRYOSURGERY     NECK SURGERY  2012   PELVIC LAPAROSCOPY  14    Family History  Problem Relation Age of Onset   Diabetes Mother    Breast cancer Mother 67   Thyroid disease Mother    Depression Mother    Hypertension Father    Heart disease Father    Heart attack Father 21       Two instances, first at age 36   Stroke Father    Alcohol abuse Father  Throat cancer Father    Diabetes Maternal Grandmother    Depression Maternal Grandmother    Uterine cancer Maternal Grandmother     Social History   Socioeconomic History   Marital status: Married    Spouse name: Not on file   Number of children: Not on file   Years of education: Not on file   Highest education level: Not on file  Occupational History   Not on file   Tobacco Use   Smoking status: Never Smoker   Smokeless tobacco: Never Used  Vaping Use   Vaping Use: Never used  Substance and Sexual Activity   Alcohol use: No   Drug use: No   Sexual activity: Yes    Partners: Male    Birth control/protection: None    Comment: vasectomy  Other Topics Concern   Not on file  Social History Narrative   Not on file   Social Determinants of Health   Financial Resource Strain:    Difficulty of Paying Living Expenses:   Food Insecurity:    Worried About Charity fundraiser in the Last Year:    Arboriculturist in the Last Year:   Transportation Needs:    Film/video editor (Medical):    Lack of Transportation (Non-Medical):   Physical Activity:    Days of Exercise per Week:    Minutes of Exercise per Session:   Stress:    Feeling of Stress :   Social Connections:    Frequency of Communication with Friends and Family:    Frequency of Social Gatherings with Friends and Family:    Attends Religious Services:    Active Member of Clubs or Organizations:    Attends Music therapist:    Marital Status:   Intimate Partner Violence:    Fear of Current or Ex-Partner:    Emotionally Abused:    Physically Abused:    Sexually Abused:     ROS Review of Systems  A fourteen system review of systems was performed and found to be positive as per HPI.  Objective:   Today's Vitals: BP 113/68    Pulse 82    Temp 98.1 F (36.7 C) (Oral)    Ht 5\' 1"  (1.549 m)    Wt 185 lb 1.6 oz (84 kg)    LMP 05/12/2011    SpO2 95%    BMI 34.97 kg/m   Physical Exam General:  Well Developed, well nourished, appropriate for stated age.  Neuro:  Alert and oriented,  extra-ocular muscles intact, no focal deficits  HEENT:  Normocephalic, atraumatic, neck supple, no carotid bruits appreciated  Abdomen: No gross distention Skin:  no gross rash, warm, pink. Cardiac:  RRR, S1 S2 Respiratory:  ECTA B/L and A/P, Not using  accessory muscles, speaking in full sentences- unlabored. Vascular:  Ext warm, no cyanosis apprec.; cap RF less 2 sec. Psych:  No HI/SI, judgement and insight good, Euthymic mood. Full Affect.   Assessment & Plan:   Problem List Items Addressed This Visit      Cardiovascular and Mediastinum   Hypertension complicating diabetes ([place])   Relevant Medications   losartan (COZAAR) 100 MG tablet   Dapagliflozin-metFORMIN HCl ER (XIGDUO XR) 10-500 MG TB24   insulin aspart (NOVOLOG) 100 UNIT/ML injection     Digestive   Fatty liver disease, nonalcoholic     Endocrine   Type 2 diabetes mellitus with other specified complication (HCC)   Relevant Medications   losartan (COZAAR) 100  MG tablet   Dapagliflozin-metFORMIN HCl ER (XIGDUO XR) 10-500 MG TB24   insulin aspart (NOVOLOG) 100 UNIT/ML injection   Hyperlipidemia associated with type 2 diabetes mellitus (HCC)   Relevant Medications   losartan (COZAAR) 100 MG tablet   Dapagliflozin-metFORMIN HCl ER (XIGDUO XR) 10-500 MG TB24   insulin aspart (NOVOLOG) 100 UNIT/ML injection     Other   Bipolar disorder (HCC)   History of robot-assisted laparoscopic hysterectomy   History of endometrial hyperplasia    Other Visit Diagnoses    Encounter to establish care    -  Primary      Outpatient Encounter Medications as of 07/31/2019  Medication Sig   ARIPiprazole (ABILIFY) 15 MG tablet Take 1 tablet by mouth at bedtime.   atorvastatin (LIPITOR) 40 MG tablet Take 1 tablet (40 mg total) by mouth daily.   clonazePAM (KLONOPIN) 1 MG tablet TAKE 1/2 TABLET TO START, TAKE AS NEEDED DAILY FOR ANXIETY. MAY INCREASE TO 1 TAB AS NEEDED UP TO MAXIMUM 2 TABLETS DAILY.   Dapagliflozin-metFORMIN HCl ER (XIGDUO XR) 10-500 MG TB24 Take 1 tablet by mouth daily.   DAYVIGO 5 MG TABS Take 1 tablet by mouth at bedtime as needed.   DULoxetine (CYMBALTA) 60 MG capsule Take 60 mg by mouth at bedtime.   HYDROcodone-acetaminophen (NORCO/VICODIN) 5-325 MG tablet  Take 1-2 tablets by mouth every 6 (six) hours as needed for moderate pain or severe pain.   insulin aspart (NOVOLOG) 100 UNIT/ML injection Inject 10 Units into the skin 3 (three) times daily before meals.   Insulin Syringes, Disposable, U-100 1 ML MISC Use as instructed. Inject into the skin three times per day   losartan (COZAAR) 100 MG tablet Take 1 tablet daily by mouth.   omeprazole (PRILOSEC) 20 MG capsule Take 20 mg by mouth at bedtime.    [DISCONTINUED] insulin aspart (NOVOLOG) 100 UNIT/ML injection Inject 5 Units into the skin 3 (three) times daily before meals. (Patient taking differently: Inject 10 Units into the skin 3 (three) times daily before meals. )   [DISCONTINUED] losartan (COZAAR) 100 MG tablet Take 1 tablet daily by mouth.   [DISCONTINUED] XIGDUO XR 10-500 MG TB24 TAKE 1 TABLET BY MOUTH EVERY DAY   [DISCONTINUED] celecoxib (CELEBREX) 100 MG capsule Take 100 mg by mouth daily. (Patient not taking: Reported on 07/31/2019)   No facility-administered encounter medications on file as of 07/31/2019.   Diabetes: -Reviewed recent fasting glucose taken during hospitalization which are stable. -Continue Xigduo and NovoLog. Provided refills. -Continue ambulatory glucose monitoring and notify clinic if FBS consistently <80 or >160. -Follow low glucose/carbohydrate diet and stay as active as possible.  HTN: - BP today is 113/68, at goal. - Continue losartan 100 mg. Provided refills. - Continue ambulatory BP and pulse monitoring and keep a log. - Follow DASH diet. - Stay well hydrated, at least 64 fl oz  HLD: - Last lipid panel: Triglycerides elevated, LDL 80. - Continue Lipitor 40 mg. - Follow heart healthy diet.  H/o robotic assisted laparoscopic hysterectomy, history of endometrial hyperplasia: -Recovering well. -Reviewed oncology notes. -Continue to follow-up with Dr. Claiborne Billings as instructed.  Bipolar disorder, anxiety: -PHQ-9 score of 6 and GAD 7 score of  7. -Continue to follow-up with psychiatry. -Continue aripirazole, clonazepam, duloxetine, dayvigo.  GERD: - Continue omeprazole.  Follow-up: Return in about 3 months (around 10/31/2019) for HTN, DM, HLD and FBW (lipid panel, cmp, a1c) few days prior to OV.   Lorrene Reid, PA-C

## 2019-08-08 DIAGNOSIS — N8502 Endometrial intraepithelial neoplasia [EIN]: Secondary | ICD-10-CM | POA: Insufficient documentation

## 2019-08-12 DIAGNOSIS — K760 Fatty (change of) liver, not elsewhere classified: Secondary | ICD-10-CM | POA: Insufficient documentation

## 2019-08-12 DIAGNOSIS — Z8742 Personal history of other diseases of the female genital tract: Secondary | ICD-10-CM | POA: Insufficient documentation

## 2019-08-14 ENCOUNTER — Telehealth: Payer: Self-pay | Admitting: Physician Assistant

## 2019-08-14 NOTE — Telephone Encounter (Signed)
Patient called while office closed for lunch left message that provider should be receiving document from her Ins Co (which denied the new Rx for :  insulin aspart (NOVOLOG) 100 UNIT/ML injection [[id number]]   Order Details Dose: 10 Units Route: Subcutaneous Frequency: 3 times daily before meals  Dispense Quantity: 10 mL Refills: 2       Sig: Inject 10 Units into the skin 3 (three) times daily before meals.       --- Per pt they say she must take metformin 1st (Pt says she has tried Metformin previously but it DOESN'T work for,(ask her if she advised provider of past issues w/ Rx / Metformin, she says no.)  Forwarding message update to med asst & will lookout for Ins Co form & forwarding to [facility] upon receipt.  --glh

## 2019-08-15 NOTE — Telephone Encounter (Signed)
It was not the Novolog being rejected by insurance but the Xigduo XR.  PA done and med approved through insurance x 1 year.   Patient is aware.AS, CMA

## 2019-08-28 ENCOUNTER — Emergency Department (HOSPITAL_COMMUNITY)
Admission: EM | Admit: 2019-08-28 | Discharge: 2019-08-28 | Disposition: A | Discharge status: Home / Self Care | Payer: Federal, State, Local not specified - PPO | Attending: Emergency Medicine | Admitting: Emergency Medicine

## 2019-08-28 ENCOUNTER — Other Ambulatory Visit: Payer: Self-pay

## 2019-08-28 ENCOUNTER — Encounter (HOSPITAL_COMMUNITY): Payer: Self-pay

## 2019-08-28 DIAGNOSIS — R197 Diarrhea, unspecified: Secondary | ICD-10-CM | POA: Diagnosis not present

## 2019-08-28 DIAGNOSIS — R1032 Left lower quadrant pain: Secondary | ICD-10-CM | POA: Diagnosis not present

## 2019-08-28 DIAGNOSIS — Z5321 Procedure and treatment not carried out due to patient leaving prior to being seen by health care provider: Secondary | ICD-10-CM | POA: Insufficient documentation

## 2019-08-28 LAB — URINALYSIS, ROUTINE W REFLEX MICROSCOPIC
Bacteria, UA: NONE SEEN
Bilirubin Urine: NEGATIVE
Glucose, UA: >=500 mg/dL — AB
Ketones, ur: NEGATIVE mg/dL
Leukocytes,Ua: NEGATIVE
Nitrite: NEGATIVE
Protein, ur: NEGATIVE mg/dL
Specific Gravity, Urine: 1.031 — ABNORMAL HIGH (ref 1.005–1.030)
pH: 5 (ref 5.0–8.0)

## 2019-08-28 LAB — CBC
HCT: 40.5 % (ref 36.0–46.0)
Hemoglobin: 12.4 g/dL (ref 12.0–15.0)
MCH: 24 pg — ABNORMAL LOW (ref 26.0–34.0)
MCHC: 30.6 g/dL (ref 30.0–36.0)
MCV: 78.5 fL — ABNORMAL LOW (ref 80.0–100.0)
Platelets: 229 10*3/uL (ref 150–400)
RBC: 5.16 MIL/uL — ABNORMAL HIGH (ref 3.87–5.11)
RDW: 15.3 % (ref 11.5–15.5)
WBC: 8 10*3/uL (ref 4.0–10.5)
nRBC: 0 % (ref 0.0–0.2)

## 2019-08-28 LAB — COMPREHENSIVE METABOLIC PANEL
ALT: 41 U/L (ref 0–44)
AST: 33 U/L (ref 15–41)
Albumin: 3.7 g/dL (ref 3.5–5.0)
Alkaline Phosphatase: 114 U/L (ref 38–126)
Anion gap: 12 (ref 5–15)
BUN: 5 mg/dL — ABNORMAL LOW (ref 6–20)
CO2: 22 mmol/L (ref 22–32)
Calcium: 9.8 mg/dL (ref 8.9–10.3)
Chloride: 103 mmol/L (ref 98–111)
Creatinine, Ser: 0.62 mg/dL (ref 0.44–1.00)
GFR calc Af Amer: >60 mL/min (ref >60)
GFR calc non Af Amer: >60 mL/min (ref >60)
Glucose, Bld: 161 mg/dL — ABNORMAL HIGH (ref 70–99)
Potassium: 3.6 mmol/L (ref 3.5–5.1)
Sodium: 137 mmol/L (ref 135–145)
Total Bilirubin: 0.4 mg/dL (ref 0.3–1.2)
Total Protein: 6.9 g/dL (ref 6.5–8.1)

## 2019-08-28 LAB — LIPASE, BLOOD: Lipase: 27 U/L (ref 11–51)

## 2019-08-28 MED ORDER — SODIUM CHLORIDE 0.9% FLUSH: 3.0000 mL | Freq: Once | INTRAVENOUS | Status: DC

## 2019-08-28 NOTE — ED Notes (Signed)
Called for vitals x3, no answer. 

## 2019-08-28 NOTE — ED Triage Notes (Signed)
Pt presents to ED with left lower abdominal pain since last night. Endorses nausea and diarrhea on Sunday. Denies vomiting. PT endorses having hysterectomy 5 weeks ago today with no complications. She denies contacting pcp or Psychologist, sport and exercise.

## 2019-08-31 ENCOUNTER — Telehealth: Payer: Federal, State, Local not specified - PPO | Admitting: Obstetrics and Gynecology

## 2019-09-07 ENCOUNTER — Other Ambulatory Visit (HOSPITAL_COMMUNITY): Payer: Federal, State, Local not specified - PPO

## 2019-09-11 ENCOUNTER — Ambulatory Visit (HOSPITAL_BASED_OUTPATIENT_CLINIC_OR_DEPARTMENT_OTHER): Admit: 2019-09-11 | Payer: Federal, State, Local not specified - PPO | Admitting: Obstetrics and Gynecology

## 2019-09-11 ENCOUNTER — Encounter (HOSPITAL_BASED_OUTPATIENT_CLINIC_OR_DEPARTMENT_OTHER): Payer: Self-pay

## 2019-09-11 SURGERY — HYSTERECTOMY, TOTAL, ROBOT-ASSISTED, LAPAROSCOPIC, WITH BILATERAL SALPINGO-OOPHORECTOMY
Anesthesia: General

## 2019-09-13 ENCOUNTER — Telehealth: Payer: Self-pay | Admitting: *Deleted

## 2019-09-13 ENCOUNTER — Other Ambulatory Visit: Payer: Self-pay

## 2019-09-13 ENCOUNTER — Ambulatory Visit (INDEPENDENT_AMBULATORY_CARE_PROVIDER_SITE_OTHER): Payer: Federal, State, Local not specified - PPO | Admitting: Nurse Practitioner

## 2019-09-13 ENCOUNTER — Encounter: Payer: Self-pay | Admitting: Nurse Practitioner

## 2019-09-13 VITALS — BP 122/80

## 2019-09-13 DIAGNOSIS — N6341 Unspecified lump in right breast, subareolar: Secondary | ICD-10-CM

## 2019-09-13 NOTE — Progress Notes (Signed)
   Acute Office Visit  Subjective:    Patient ID: Morgan [name], female    DOB: 25-Mar-1965, 54 y.o.   MRN: [id number]   HPI 54 y.o. presents today for right breast lump and tenderness she noticed yesterday. She describes it as right behind the nipple and tender to touch. Denies redness, swelling, or nipple discharge. Right breast lesion found last year with benign biopsy 04/19/2018. Mammogram 05/25/2019 again confirmed lesion. 06/18/2019 lumpectomy with placement of radioactive seed.    Review of Systems  Constitutional: Negative.   Skin: Negative for color change.       Tenderness behind nipple, possible lump  Hematological: Negative.        Objective:    Physical Exam Constitutional:      Appearance: Normal appearance.  Chest:     Breasts:        Right: Mass and tenderness present. No swelling, bleeding, inverted nipple, nipple discharge or skin change.        Left: Normal.    Lymphadenopathy:     Upper Body:     Right upper body: No supraclavicular or axillary adenopathy.     Left upper body: No supraclavicular or axillary adenopathy.     BP 122/80 (BP Location: Right Arm, Patient Position: Sitting, Cuff Size: Normal)   LMP 05/12/2011  Wt Readings from Last 3 Encounters:  08/28/19 185 lb (83.9 kg)  07/31/19 185 lb 1.6 oz (84 kg)  06/18/19 185 lb 3 oz (84 kg)        Assessment & Plan:   Problem List Items Addressed This Visit    None    Visit Diagnoses    Lump in central portion of right breast    -  Primary      Plan: Will send referral for diagnostic mammogram. Ibuprofen/Tylenol as needed for pain. No redness or swelling at visit. If symptoms progress over the weekend and she experiences swelling, redness, or fever she will go to the emergency room. She is agreeable to plan.      Tamela Gammon [facility], 12:55 PM 09/13/2019

## 2019-09-13 NOTE — Telephone Encounter (Signed)
-----   Message from Tamela Gammon, NP sent at 09/13/2019 12:55 PM EDT ----- Please send referral for diagnostic mammogram of right breast for tenderness and possible mass. Thank you

## 2019-09-14 NOTE — Telephone Encounter (Signed)
Patient scheduled on 10/02/19 @ 2:20pm at breast center, patient informed.

## 2019-09-16 DIAGNOSIS — C801 Malignant (primary) neoplasm, unspecified: Secondary | ICD-10-CM

## 2019-09-16 HISTORY — DX: Malignant (primary) neoplasm, unspecified: C80.1

## 2019-10-02 ENCOUNTER — Ambulatory Visit
Admission: RE | Admit: 2019-10-02 | Discharge: 2019-10-02 | Disposition: A | Discharge status: Home / Self Care | Payer: Federal, State, Local not specified - PPO | Source: Ambulatory Visit | Attending: Nurse Practitioner | Admitting: Nurse Practitioner

## 2019-10-02 ENCOUNTER — Other Ambulatory Visit: Payer: Self-pay

## 2019-10-02 DIAGNOSIS — N6341 Unspecified lump in right breast, subareolar: Secondary | ICD-10-CM

## 2019-10-29 ENCOUNTER — Other Ambulatory Visit: Payer: Federal, State, Local not specified - PPO

## 2019-10-31 ENCOUNTER — Ambulatory Visit: Payer: Federal, State, Local not specified - PPO | Admitting: Physician Assistant

## 2019-10-31 ENCOUNTER — Other Ambulatory Visit: Payer: Self-pay | Admitting: Family Medicine

## 2019-10-31 NOTE — Telephone Encounter (Signed)
Morgan [name],  Please review request to see if patient is eligible for refill

## 2019-10-31 NOTE — Telephone Encounter (Signed)
Request for RF- sent for review

## 2019-11-01 ENCOUNTER — Other Ambulatory Visit: Payer: Self-pay | Admitting: Physician Assistant

## 2019-11-01 NOTE — Telephone Encounter (Signed)
Patient seen in our office for new patient appointment on 07/31/19 and advised to follow up in 3 months.   Patient does not have future apt scheduled.    Patient requesting refill of Atorvastatin. This med has not been prescribed for patient by [place] before.   Please approve #60 day suppy (per refill protocol) for patient if deemed appropriate.   Dorothea Ogle,  Please call patient to schedule apt for further med refills. AS, CMA

## 2019-11-15 ENCOUNTER — Other Ambulatory Visit: Payer: Self-pay

## 2019-11-15 ENCOUNTER — Encounter (HOSPITAL_COMMUNITY): Payer: Self-pay | Admitting: Emergency Medicine

## 2019-11-15 ENCOUNTER — Emergency Department (HOSPITAL_COMMUNITY): Payer: Federal, State, Local not specified - PPO

## 2019-11-15 ENCOUNTER — Emergency Department (HOSPITAL_COMMUNITY)
Admission: EM | Admit: 2019-11-15 | Discharge: 2019-11-16 | Disposition: A | Discharge status: Home / Self Care | Payer: Federal, State, Local not specified - PPO | Attending: Emergency Medicine | Admitting: Emergency Medicine

## 2019-11-15 DIAGNOSIS — E1169 Type 2 diabetes mellitus with other specified complication: Secondary | ICD-10-CM | POA: Insufficient documentation

## 2019-11-15 DIAGNOSIS — I10 Essential (primary) hypertension: Secondary | ICD-10-CM | POA: Diagnosis not present

## 2019-11-15 DIAGNOSIS — Z79899 Other long term (current) drug therapy: Secondary | ICD-10-CM | POA: Insufficient documentation

## 2019-11-15 DIAGNOSIS — Z794 Long term (current) use of insulin: Secondary | ICD-10-CM | POA: Insufficient documentation

## 2019-11-15 DIAGNOSIS — R519 Headache, unspecified: Secondary | ICD-10-CM | POA: Insufficient documentation

## 2019-11-15 LAB — URINALYSIS, ROUTINE W REFLEX MICROSCOPIC
Bilirubin Urine: NEGATIVE
Glucose, UA: >=500 mg/dL — AB
Hgb urine dipstick: NEGATIVE
Ketones, ur: NEGATIVE mg/dL
Nitrite: NEGATIVE
Protein, ur: NEGATIVE mg/dL
Specific Gravity, Urine: 1.026 (ref 1.005–1.030)
pH: 5 (ref 5.0–8.0)

## 2019-11-15 LAB — BASIC METABOLIC PANEL
Anion gap: 9 (ref 5–15)
BUN: 9 mg/dL (ref 6–20)
CO2: 26 mmol/L (ref 22–32)
Calcium: 9.8 mg/dL (ref 8.9–10.3)
Chloride: 101 mmol/L (ref 98–111)
Creatinine, Ser: 0.69 mg/dL (ref 0.44–1.00)
GFR calc Af Amer: >60 mL/min (ref >60)
GFR calc non Af Amer: >60 mL/min (ref >60)
Glucose, Bld: 128 mg/dL — ABNORMAL HIGH (ref 70–99)
Potassium: 3.4 mmol/L — ABNORMAL LOW (ref 3.5–5.1)
Sodium: 136 mmol/L (ref 135–145)

## 2019-11-15 LAB — CBC
HCT: 40.9 % (ref 36.0–46.0)
Hemoglobin: 12.3 g/dL (ref 12.0–15.0)
MCH: 24.1 pg — ABNORMAL LOW (ref 26.0–34.0)
MCHC: 30.1 g/dL (ref 30.0–36.0)
MCV: 80.2 fL (ref 80.0–100.0)
Platelets: 224 10*3/uL (ref 150–400)
RBC: 5.1 MIL/uL (ref 3.87–5.11)
RDW: 14.8 % (ref 11.5–15.5)
WBC: 6.5 10*3/uL (ref 4.0–10.5)
nRBC: 0 % (ref 0.0–0.2)

## 2019-11-15 LAB — I-STAT BETA HCG BLOOD, ED (MC, WL, AP ONLY): I-stat hCG, quantitative: 6.7 m[IU]/mL — ABNORMAL HIGH (ref <5)

## 2019-11-15 NOTE — ED Triage Notes (Signed)
Pt c/o left side HA, numbness and tingling for the past 3 days not getting any relief. No neuro deficit noticed.

## 2019-11-16 MED ORDER — DIPHENHYDRAMINE HCL 50 MG/ML IJ SOLN
25.0000 mg | Freq: Once | INTRAMUSCULAR | Status: AC
  Administered 2019-11-16: 25 mg via INTRAMUSCULAR
  Filled 2019-11-16: qty 1

## 2019-11-16 MED ORDER — SODIUM CHLORIDE 0.9 % IV BOLUS
500.0000 mL | Freq: Once | INTRAVENOUS | Status: AC
  Administered 2019-11-16: 500 mL via INTRAVENOUS

## 2019-11-16 MED ORDER — PROCHLORPERAZINE EDISYLATE 10 MG/2ML IJ SOLN
5.0000 mg | Freq: Once | INTRAMUSCULAR | Status: AC
  Administered 2019-11-16: 5 mg via INTRAVENOUS
  Filled 2019-11-16: qty 2

## 2019-11-16 MED ORDER — BUTALBITAL-APAP-CAFFEINE 50-325-40 MG PO TABS: 1.0000 | ORAL_TABLET | Freq: Four times a day (QID) | ORAL | 0 refills | Status: AC | PRN

## 2019-11-16 MED ORDER — KETOROLAC TROMETHAMINE 30 MG/ML IJ SOLN
15.0000 mg | Freq: Once | INTRAMUSCULAR | Status: AC
  Administered 2019-11-16: 15 mg via INTRAVENOUS
  Filled 2019-11-16: qty 1

## 2019-11-16 NOTE — ED Provider Notes (Signed)
[facility] EMERGENCY DEPARTMENT Provider Note   CSN: [id number] Arrival date & time: 11/15/19  2036     History Chief Complaint  Patient presents with  . Headache  . Numbness    Morgan [name] is a 54 y.o. female.   Headache Pain location:  Occipital, L temporal and L parietal Quality:  Sharp, stabbing and dull Radiates to:  Does not radiate Severity currently:  6/10 Severity at highest:  7/10 Onset quality:  Gradual Duration:  3 days Timing:  Constant Progression:  Waxing and waning Chronicity:  New Similar to prior headaches: no   Context: not activity, not exposure to bright light, not caffeine, not coughing, not defecating, not eating, not stress, not exposure to cold air, not intercourse, not loud noise and not straining   Relieved by:  Nothing Worsened by:  Nothing Ineffective treatments:  NSAIDs and resting in a darkened room Associated symptoms: numbness   Associated symptoms: no abdominal pain, no back pain, no blurred vision, no congestion, no cough, no diarrhea, no dizziness, no drainage, no ear pain, no eye pain, no facial pain, no fatigue, no fever, no focal weakness, no hearing loss, no loss of balance, no myalgias, no nausea, no near-syncope, no neck pain, no neck stiffness, no paresthesias, no photophobia, no seizures, no sinus pressure, no sore throat, no swollen glands, no syncope, no tingling, no URI, no visual change, no vomiting and no weakness   Associated symptoms comment:  Intermittent Left Arm and leg numbness. None at this time. No other Neuro complaints. Risk factors: no anger, no family hx of SAH, does not have insomnia and lifestyle not sedentary        Past Medical History:  Diagnosis Date  . Anxiety   . Bipolar disorder ([place])   . Breast mass    right  . Cervical dysplasia   . Diabetes mellitus without complication ([place])   . Endometriosis   . GERD (gastroesophageal reflux disease)   . Hypercholesteremia   .  Hypertension   . Migraine   . Pelvic kidney    left  . Postpartum depression    hx of    Patient Active Problem List   Diagnosis Date Noted  . History of endometrial hyperplasia 08/12/2019  . Fatty liver disease, nonalcoholic 15/17/6160  . History of robot-assisted laparoscopic hysterectomy 07/25/2019  . Cervical dysplasia 04/18/2019  . Diabetes type 2, uncontrolled ([place]) 04/18/2019  . High cholesterol 04/18/2019  . Chest pain 06/14/2017  . Hypertension complicating diabetes ([place]) 06/14/2017  . Type 2 diabetes mellitus with other specified complication ([place]) 73/71/0626  . Hyperlipidemia associated with type 2 diabetes mellitus ([place]) 06/14/2017  . Bipolar disorder [facility])     Past Surgical History:  Procedure Laterality Date  . ABDOMINAL HYSTERECTOMY N/A    Phreesia 07/30/2019  . BACK SURGERY  2014  . BREAST LUMPECTOMY WITH RADIOACTIVE SEED LOCALIZATION Right 10/28/2014   Procedure: RIGHT BREAST LUMPECTOMY WITH RADIOACTIVE SEED LOCALIZATION;  Surgeon: [name], MD;  Location: [place];  Service: General;  Laterality: Right;  . BREAST LUMPECTOMY WITH RADIOACTIVE SEED LOCALIZATION Right 06/18/2019   Procedure: RIGHT BREAST RADIOACTIVE SEED LOCALIZATION LUMPECTOMY;  Surgeon: Jovita Kussmaul, MD;  Location: [place];  Service: General;  Laterality: Right;  . BREAST SURGERY N/A    Phreesia 07/30/2019  . CESAREAN SECTION  03  . COLONOSCOPY WITH PROPOFOL N/A 05/01/2018   Procedure: COLONOSCOPY WITH PROPOFOL;  Surgeon: [name], MD;  Location: [facility]  ENDOSCOPY;  Service: Gastroenterology;  Laterality: N/A;  . COLPOSCOPY    . ESOPHAGOGASTRODUODENOSCOPY (EGD) WITH PROPOFOL N/A 05/01/2018   Procedure: ESOPHAGOGASTRODUODENOSCOPY (EGD) WITH PROPOFOL;  Surgeon: [name], MD;  Location: [facility] ENDOSCOPY;  Service: Gastroenterology;  Laterality: N/A;  . ESOPHAGOGASTRODUODENOSCOPY (EGD) WITH PROPOFOL N/A 11/13/2018   Procedure: ESOPHAGOGASTRODUODENOSCOPY (EGD)  WITH PROPOFOL;  Surgeon: [name], MD;  Location: [facility] ENDOSCOPY;  Service: Gastroenterology;  Laterality: N/A;  . GYNECOLOGIC CRYOSURGERY    . NECK SURGERY  2012  . PELVIC LAPAROSCOPY  82     OB History    Gravida  1   Para  1   Term  1   Preterm      AB  0   Living  1     SAB      TAB      Ectopic  0   Multiple      Live Births              Family History  Problem Relation Age of Onset  . Diabetes Mother   . Breast cancer Mother 46  . Thyroid disease Mother   . Depression Mother   . Hypertension Father   . Heart disease Father   . Heart attack Father 71       Two instances, first at age 26  . Stroke Father   . Alcohol abuse Father   . Throat cancer Father   . Diabetes Maternal Grandmother   . Depression Maternal Grandmother   . Uterine cancer Maternal Grandmother     Social History   Tobacco Use  . Smoking status: Never Smoker  . Smokeless tobacco: Never Used  Vaping Use  . Vaping Use: Never used  Substance Use Topics  . Alcohol use: No  . Drug use: No    Home Medications Prior to Admission medications   Medication Sig Start Date End Date Taking? Authorizing Provider  ARIPiprazole (ABILIFY) 15 MG tablet Take 1 tablet by mouth at bedtime. 12/21/17   Noemi Chapel, NP  atorvastatin (LIPITOR) 40 MG tablet Take 1 tablet (40 mg total) by mouth daily. **NEEDS APT FOR FURTHER REFILLS** 11/01/19   Abonza, Maritza, PA-C  clonazePAM (KLONOPIN) 1 MG tablet TAKE 1/2 TABLET TO START, TAKE AS NEEDED DAILY FOR ANXIETY. MAY INCREASE TO 1 TAB AS NEEDED UP TO MAXIMUM 2 TABLETS DAILY. 07/09/19   [provider]  Dapagliflozin-metFORMIN HCl ER (XIGDUO XR) 10-500 MG TB24 Take 1 tablet by mouth daily. 07/31/19   Abonza, Maritza, PA-C  DAYVIGO 5 MG TABS Take 1 tablet by mouth at bedtime as needed. 07/09/19   [provider]  DULoxetine (CYMBALTA) 60 MG capsule Take 60 mg by mouth at bedtime. 11/04/16   [provider]    HYDROcodone-acetaminophen (NORCO/VICODIN) 5-325 MG tablet Take 1-2 tablets by mouth every 6 (six) hours as needed for moderate pain or severe pain. 06/18/19   [name], MD  insulin aspart (NOVOLOG) 100 UNIT/ML injection Inject 10 Units into the skin 3 (three) times daily before meals. 07/31/19 10/29/19  Lorrene Reid, PA-C  Insulin Syringes, Disposable, U-100 1 ML MISC Use as instructed. Inject into the skin three times per day 12/12/18   Gildardo Pounds, NP  losartan (COZAAR) 100 MG tablet Take 1 tablet daily by mouth. 07/31/19   Lorrene Reid, PA-C  omeprazole (PRILOSEC) 20 MG capsule Take 20 mg by mouth at bedtime.     [provider]    Allergies    Clindamycin/lincomycin,  Other, and Lamotrigine  Review of Systems   Review of Systems  Constitutional: Negative for fatigue and fever.  HENT: Negative for congestion, ear pain, hearing loss, postnasal drip, sinus pressure and sore throat.   Eyes: Negative for blurred vision, photophobia and pain.  Respiratory: Negative for cough.   Cardiovascular: Negative for syncope and near-syncope.  Gastrointestinal: Negative for abdominal pain, diarrhea, nausea and vomiting.  Musculoskeletal: Negative for back pain, myalgias, neck pain and neck stiffness.  Neurological: Positive for numbness and headaches. Negative for dizziness, focal weakness, seizures, weakness, paresthesias and loss of balance.    Physical Exam Updated Vital Signs BP 120/72 (BP Location: Left Arm)   Pulse 69   Temp 98.4 F (36.9 C) (Oral)   Resp 18   Ht 5\' 1"  (1.549 m)   Wt 83.9 kg   LMP 05/12/2011   SpO2 100%   BMI 34.95 kg/m   Physical Exam Vitals and nursing note reviewed.  Constitutional:      General: She is not in acute distress.    Appearance: She is well-developed. She is not diaphoretic.  HENT:     Head: Normocephalic and atraumatic.  Eyes:     General: No scleral icterus.    Conjunctiva/sclera: Conjunctivae normal.     Pupils: Pupils  are equal, round, and reactive to light.     Comments: No horizontal, vertical or rotational nystagmus  Neck:     Comments: Full active and passive ROM without pain No midline or paraspinal tenderness No nuchal rigidity or meningeal signs Cardiovascular:     Rate and Rhythm: Normal rate and regular rhythm.  Pulmonary:     Effort: Pulmonary effort is normal. No respiratory distress.     Breath sounds: Normal breath sounds. No wheezing or rales.  Abdominal:     General: Bowel sounds are normal.     Palpations: Abdomen is soft.     Tenderness: There is no abdominal tenderness. There is no guarding or rebound.  Musculoskeletal:        General: Normal range of motion.     Cervical back: Normal range of motion and neck supple.  Lymphadenopathy:     Cervical: No cervical adenopathy.  Skin:    General: Skin is warm and dry.     Findings: No rash.     Comments: Multiple scabbed, singular lesions on the left scalp   Neurological:     Mental Status: She is alert and oriented to person, place, and time.     GCS: GCS eye subscore is 4. GCS verbal subscore is 5. GCS motor subscore is 6.     Cranial Nerves: No cranial nerve deficit.     Motor: No abnormal muscle tone.     Coordination: Coordination normal.     Comments: Mental Status:  Alert, oriented, thought content appropriate. Speech fluent without evidence of aphasia. Able to follow 2 step commands without difficulty.  Cranial Nerves:  II:  Peripheral visual fields grossly normal, pupils equal, round, reactive to light III,IV, VI: ptosis not present, extra-ocular motions intact bilaterally  V,VII: smile symmetric, facial light touch sensation equal VIII: hearing grossly normal bilaterally  IX,X: midline uvula rise  XI: bilateral shoulder shrug equal and strong XII: midline tongue extension  Motor:  5/5 in upper and lower extremities bilaterally including strong and equal grip strength and dorsiflexion/plantar flexion Sensory: Pinprick  and light touch normal in all extremities.  Cerebellar: normal finger-to-nose with bilateral upper extremities Gait: normal gait and balance CV: distal  pulses palpable throughout   Psychiatric:        Behavior: Behavior normal.        Thought Content: Thought content normal.        Judgment: Judgment normal.     ED Results / Procedures / Treatments   Labs (all labs ordered are listed, but only abnormal results are displayed) Labs Reviewed  BASIC METABOLIC PANEL - Abnormal; Notable for the following components:      Result Value   Potassium 3.4 (*)    Glucose, Bld 128 (*)    All other components within normal limits  CBC - Abnormal; Notable for the following components:   MCH 24.1 (*)    All other components within normal limits  URINALYSIS, ROUTINE W REFLEX MICROSCOPIC - Abnormal; Notable for the following components:   Glucose, UA >=500 (*)    Leukocytes,Ua TRACE (*)    Bacteria, UA RARE (*)    All other components within normal limits  I-STAT BETA HCG BLOOD, ED ([facility], [facility], [facility] ONLY) - Abnormal; Notable for the following components:   I-stat hCG, quantitative 6.7 (*)    All other components within normal limits  CBG MONITORING, ED    EKG None  Radiology CT HEAD WO CONTRAST  Result Date: 11/15/2019 CLINICAL DATA:  Headache, chronic, no new features Patient reports left-sided headache with numbness and tingling for 3 days. EXAM: CT HEAD WITHOUT CONTRAST TECHNIQUE: Contiguous axial images were obtained from the base of the skull through the vertex without intravenous contrast. COMPARISON:  Head CT 12/06/2016 FINDINGS: Brain: Brain volume is normal for age. No intracranial hemorrhage, mass effect, or midline shift. No hydrocephalus. The basilar cisterns are patent. No evidence of territorial infarct or acute ischemia. No extra-axial or intracranial fluid collection. Vascular: No hyperdense vessel or unexpected calcification. Skull: Normal. Negative for fracture or focal lesion.  Sinuses/Orbits: Paranasal sinuses and mastoid air cells are clear. The visualized orbits are unremarkable. Other: None. IMPRESSION: Negative noncontrast head CT. Electronically Signed   By: Keith Rake M.D.   On: 11/15/2019 22:10    Procedures Procedures (including critical care time)  Medications Ordered in ED Medications - No data to display  ED Course  I have reviewed the triage vital signs and the nursing notes.  Pertinent labs & imaging results that were available during my care of the patient were reviewed by me and considered in my medical decision making (see chart for details).    MDM Rules/Calculators/A&P                         7:15 AM BP 120/72 (BP Location: Left Arm)   Pulse 69   Temp 98.4 F (36.9 C) (Oral)   Resp 18   Ht 5\' 1"  (1.549 m)   Wt 83.9 kg   LMP 05/12/2011   SpO2 100%   BMI 34.95 kg/m  Patient  Here with c/o headache. Emergent considerations for headache include subarachnoid hemorrhage, meningitis, temporal arteritis, glaucoma, cerebral ischemia, carotid/vertebral dissection, intracranial tumor, Venous sinus thrombosis, carbon monoxide poisoning, acute or chronic subdural hemorrhage.  Other considerations include: Migraine, Cluster headache, Hypertension, Caffeine, alcohol, or drug withdrawal, Pseudotumor cerebri, Arteriovenous malformation, Head injury, Neurocysticercosis, Post-lumbar puncture, Preeclampsia, Tension headache, Sinusitis, Cervical arthritis, Refractive error causing strain, Dental abscess, Otitis media, Temporomandibular joint syndrome, Depression, Somatoform disorder (eg, somatization) Trigeminal neuralgia, Glossopharyngeal neuralgia. Patient does have lesions over the left scalp however these do not appear consistent with Zoster.  No obvious neuro  deficits. I have very low suspicion for vertebral artery dissection.  I will treat HA with migraine cocktail (Compazine, benadryl, toradol) and reevaluate.    Morgan [name] presents with  headache Given the large differential diagnosis for [employer], the decision making in this case is of high complexity.  After evaluating all of the data points in this case, the presentation of Morgan [name] is NOT consistent with skull fracture, meningitis/encephalitis, SAH/sentinel bleed, Intracranial Hemorrhage (ICH) (subdural/epidural), acute obstructive hydrocephalus, space occupying lesions, CVA, CO Poisoning, Basilar/vertebral artery dissection, preeclampsia, cerebral venous thrombosis, hypertensive emergency, temporal Arteritis, Idiopathic Intracranial Hypertension (pseudotumor cerebri).   Patient treated with migraine cocktail with some improvement. D/c with Fiorcet and close Neuro follow up. Patient seen in shared visit with attending physician. Who agrees with assessment, work up , treatment, and plan.  Strict return and follow-up precautions have been given by me personally or by detailed written instructions verbalized by nursing staff using the teach back method to patient/family/caregiver.  Data Reviewed/Counseling: I have reviewed the patient's vital signs, nursing notes, and other relevant tests/information. I had a detailed discussion regarding the historical points, exam findings, and any diagnostic results supporting the discharge diagnosis. I also discussed the need for outpatient follow-up and the need to return to the ED if symptoms worsen or if there are any questions or concerns that arise at [facility]  Final Clinical Impression(s) / ED Diagnoses Final diagnoses:  Bad headache    Rx / DC Orders ED Discharge Orders    None       Margarita Mail, PA-C 11/16/19 1507    Margette Fast, MD 11/21/19 2011

## 2019-11-16 NOTE — Discharge Instructions (Addendum)
Your work up and CT scans were negative.  Your labs were also OK and you may review them on MyChart. I am sending you home with a strong Headache medication. You should take this medication once you have gotten home and should not drive anywhere for the rest of the day. PLEASE make an appointment with your primary care doctor and neurology for further work up.  You are having a headache. No specific cause was found today for your headache. It may have been a migraine or other cause of headache. Stress, anxiety, fatigue, and depression are common triggers for headaches. Your headache today does not appear to be life-threatening or require hospitalization, but often the exact cause of headaches is not determined in the emergency department. Therefore, follow-up with your doctor is very important to find out what may have caused your headache, and whether or not you need any further diagnostic testing or treatment. Sometimes headaches can appear benign (not harmful), but then more serious symptoms can develop which should prompt an immediate re-evaluation by your doctor or the emergency department. SEEK MEDICAL ATTENTION IF: You develop possible problems with medications prescribed.  The medications don't resolve your headache, if it recurs , or if you have multiple episodes of vomiting or can't take fluids. You have a change from the usual headache. RETURN IMMEDIATELY IF you develop a sudden, severe headache or confusion, become poorly responsive or faint, develop a fever above 100.74F or problem breathing, have a change in speech, vision, swallowing, or understanding, or develop new weakness, numbness, tingling, incoordination, or have a seizure.

## 2020-01-15 ENCOUNTER — Encounter (HOSPITAL_COMMUNITY): Payer: Self-pay | Admitting: Emergency Medicine

## 2020-01-15 ENCOUNTER — Emergency Department (HOSPITAL_COMMUNITY)
Admission: EM | Admit: 2020-01-15 | Discharge: 2020-01-15 | Disposition: A | Discharge status: Home / Self Care | Payer: Federal, State, Local not specified - PPO | Attending: Emergency Medicine | Admitting: Emergency Medicine

## 2020-01-15 ENCOUNTER — Emergency Department (HOSPITAL_COMMUNITY): Payer: Federal, State, Local not specified - PPO

## 2020-01-15 ENCOUNTER — Other Ambulatory Visit: Payer: Self-pay

## 2020-01-15 DIAGNOSIS — Z794 Long term (current) use of insulin: Secondary | ICD-10-CM | POA: Insufficient documentation

## 2020-01-15 DIAGNOSIS — R110 Nausea: Secondary | ICD-10-CM | POA: Insufficient documentation

## 2020-01-15 DIAGNOSIS — R109 Unspecified abdominal pain: Secondary | ICD-10-CM

## 2020-01-15 DIAGNOSIS — R1010 Upper abdominal pain, unspecified: Secondary | ICD-10-CM | POA: Diagnosis not present

## 2020-01-15 DIAGNOSIS — Z7984 Long term (current) use of oral hypoglycemic drugs: Secondary | ICD-10-CM | POA: Insufficient documentation

## 2020-01-15 DIAGNOSIS — E119 Type 2 diabetes mellitus without complications: Secondary | ICD-10-CM | POA: Insufficient documentation

## 2020-01-15 DIAGNOSIS — R1030 Lower abdominal pain, unspecified: Secondary | ICD-10-CM | POA: Insufficient documentation

## 2020-01-15 DIAGNOSIS — Z79899 Other long term (current) drug therapy: Secondary | ICD-10-CM | POA: Diagnosis not present

## 2020-01-15 DIAGNOSIS — I10 Essential (primary) hypertension: Secondary | ICD-10-CM | POA: Insufficient documentation

## 2020-01-15 DIAGNOSIS — K219 Gastro-esophageal reflux disease without esophagitis: Secondary | ICD-10-CM | POA: Insufficient documentation

## 2020-01-15 LAB — DIFFERENTIAL
Abs Immature Granulocytes: 0.04 10*3/uL (ref 0.00–0.07)
Basophils Absolute: 0.1 10*3/uL (ref 0.0–0.1)
Basophils Relative: 1 %
Eosinophils Absolute: 0.3 10*3/uL (ref 0.0–0.5)
Eosinophils Relative: 6 %
Immature Granulocytes: 1 %
Lymphocytes Relative: 29 %
Lymphs Abs: 1.6 10*3/uL (ref 0.7–4.0)
Monocytes Absolute: 0.3 10*3/uL (ref 0.1–1.0)
Monocytes Relative: 5 %
Neutro Abs: 3.3 10*3/uL (ref 1.7–7.7)
Neutrophils Relative %: 58 %

## 2020-01-15 LAB — COMPREHENSIVE METABOLIC PANEL
ALT: 33 U/L (ref 0–44)
AST: 31 U/L (ref 15–41)
Albumin: 3.7 g/dL (ref 3.5–5.0)
Alkaline Phosphatase: 114 U/L (ref 38–126)
Anion gap: 9 (ref 5–15)
BUN: 6 mg/dL (ref 6–20)
CO2: 27 mmol/L (ref 22–32)
Calcium: 9.7 mg/dL (ref 8.9–10.3)
Chloride: 99 mmol/L (ref 98–111)
Creatinine, Ser: 0.63 mg/dL (ref 0.44–1.00)
GFR, Estimated: >60 mL/min (ref >60)
Glucose, Bld: 145 mg/dL — ABNORMAL HIGH (ref 70–99)
Potassium: 3.2 mmol/L — ABNORMAL LOW (ref 3.5–5.1)
Sodium: 135 mmol/L (ref 135–145)
Total Bilirubin: 0.6 mg/dL (ref 0.3–1.2)
Total Protein: 7.3 g/dL (ref 6.5–8.1)

## 2020-01-15 LAB — URINALYSIS, ROUTINE W REFLEX MICROSCOPIC
Bilirubin Urine: NEGATIVE
Glucose, UA: >=500 mg/dL — AB
Hgb urine dipstick: NEGATIVE
Ketones, ur: NEGATIVE mg/dL
Leukocytes,Ua: NEGATIVE
Nitrite: NEGATIVE
Protein, ur: NEGATIVE mg/dL
Specific Gravity, Urine: 1.015 (ref 1.005–1.030)
pH: 5 (ref 5.0–8.0)

## 2020-01-15 LAB — CBC
HCT: 43.9 % (ref 36.0–46.0)
Hemoglobin: 13.3 g/dL (ref 12.0–15.0)
MCH: 24.4 pg — ABNORMAL LOW (ref 26.0–34.0)
MCHC: 30.3 g/dL (ref 30.0–36.0)
MCV: 80.6 fL (ref 80.0–100.0)
Platelets: 212 10*3/uL (ref 150–400)
RBC: 5.45 MIL/uL — ABNORMAL HIGH (ref 3.87–5.11)
RDW: 15.6 % — ABNORMAL HIGH (ref 11.5–15.5)
WBC: 5.6 10*3/uL (ref 4.0–10.5)
nRBC: 0 % (ref 0.0–0.2)

## 2020-01-15 LAB — LIPASE, BLOOD: Lipase: 23 U/L (ref 11–51)

## 2020-01-15 MED ORDER — DICYCLOMINE HCL 10 MG/ML IM SOLN
20.0000 mg | Freq: Once | INTRAMUSCULAR | Status: AC
  Administered 2020-01-15: 20 mg via INTRAMUSCULAR
  Filled 2020-01-15: qty 2

## 2020-01-15 MED ORDER — MORPHINE SULFATE (PF) 2 MG/ML IV SOLN
2.0000 mg | Freq: Once | INTRAVENOUS | Status: AC
  Administered 2020-01-15: 2 mg via INTRAVENOUS
  Filled 2020-01-15: qty 1

## 2020-01-15 MED ORDER — ONDANSETRON 4 MG PO TBDP: 4.0000 mg | ORAL_TABLET | Freq: Three times a day (TID) | ORAL | 0 refills | Status: DC | PRN

## 2020-01-15 MED ORDER — SODIUM CHLORIDE 0.9 % IV BOLUS
500.0000 mL | Freq: Once | INTRAVENOUS | Status: AC
  Administered 2020-01-15: 500 mL via INTRAVENOUS

## 2020-01-15 MED ORDER — IOHEXOL 300 MG/ML  SOLN
100.0000 mL | Freq: Once | INTRAMUSCULAR | Status: AC | PRN
  Administered 2020-01-15: 100 mL via INTRAVENOUS

## 2020-01-15 MED ORDER — DICYCLOMINE HCL 20 MG PO TABS: 20.0000 mg | ORAL_TABLET | Freq: Two times a day (BID) | ORAL | 0 refills | Status: DC

## 2020-01-15 MED ORDER — ONDANSETRON HCL 4 MG/2ML IJ SOLN
4.0000 mg | Freq: Once | INTRAMUSCULAR | Status: AC
  Administered 2020-01-15: 4 mg via INTRAVENOUS
  Filled 2020-01-15: qty 2

## 2020-01-15 NOTE — ED Provider Notes (Signed)
Physical Exam  BP (!) 145/80   Pulse 72   Temp 98.1 F (36.7 C) (Oral)   Resp 16   Ht 5\' 1"  (1.549 m)   Wt 81.6 kg   LMP 05/12/2011   SpO2 98%   BMI 34.01 kg/m   Physical Exam  ED Course/Procedures     Procedures  MDM   Care of patient assumed from [place] at 3:30 PM.  Agree with history, physical exam and plan.  See their note for further details.  Briefly, 54 y.o. female with PMH/PSH as below who presents with 2-day history of cramping abdominal pain.  Reports nausea but denies vomiting.  No changes to bowel movements or urination.  Lab work here is unremarkable.  Past Medical History:  Diagnosis Date  . Anxiety   . Bipolar disorder ([place])   . Breast mass    right  . Cervical dysplasia   . Diabetes mellitus without complication ([place])   . Endometriosis   . GERD (gastroesophageal reflux disease)   . Hypercholesteremia   . Hypertension   . Migraine   . Pelvic kidney    left  . Postpartum depression    hx of   Past Surgical History:  Procedure Laterality Date  . ABDOMINAL HYSTERECTOMY N/A    Phreesia 07/30/2019  . BACK SURGERY  2014  . BREAST LUMPECTOMY WITH RADIOACTIVE SEED LOCALIZATION Right 10/28/2014   Procedure: RIGHT BREAST LUMPECTOMY WITH RADIOACTIVE SEED LOCALIZATION;  Surgeon: [name], MD;  Location: [place];  Service: General;  Laterality: Right;  . BREAST LUMPECTOMY WITH RADIOACTIVE SEED LOCALIZATION Right 06/18/2019   Procedure: RIGHT BREAST RADIOACTIVE SEED LOCALIZATION LUMPECTOMY;  Surgeon: Jovita Kussmaul, MD;  Location: [place];  Service: General;  Laterality: Right;  . BREAST SURGERY N/A    Phreesia 07/30/2019  . CESAREAN SECTION  03  . COLONOSCOPY WITH PROPOFOL N/A 05/01/2018   Procedure: COLONOSCOPY WITH PROPOFOL;  Surgeon: [name], MD;  Location: [facility] ENDOSCOPY;  Service: Gastroenterology;  Laterality: N/A;  . COLPOSCOPY    . ESOPHAGOGASTRODUODENOSCOPY (EGD) WITH PROPOFOL N/A 05/01/2018    Procedure: ESOPHAGOGASTRODUODENOSCOPY (EGD) WITH PROPOFOL;  Surgeon: [name], MD;  Location: [facility] ENDOSCOPY;  Service: Gastroenterology;  Laterality: N/A;  . ESOPHAGOGASTRODUODENOSCOPY (EGD) WITH PROPOFOL N/A 11/13/2018   Procedure: ESOPHAGOGASTRODUODENOSCOPY (EGD) WITH PROPOFOL;  Surgeon: [name], MD;  Location: [facility] ENDOSCOPY;  Service: Gastroenterology;  Laterality: N/A;  . GYNECOLOGIC CRYOSURGERY    . NECK SURGERY  2012  . PELVIC LAPAROSCOPY  89      Current Plan: Plan is to obtain CT of abdomen pelvis to rule out acute infectious or surgical cause of symptoms.  She denies any pelvic complaints.   MDM/ED Course: CT scan of the abdomen pelvis without any acute findings.  She does have possible pneumonia or inflammatory process seen on right lower lobe the patient denies symptoms.  She remains hemodynamically stable.  Suspect that her symptoms could be viral in nature.  Will treat with antispasmodics and antiemetics and have her follow-up with PCP.  Return precautions given.   Consults: None   Significant labs/images: CT Abdomen Pelvis W Contrast  Result Date: 01/15/2020 CLINICAL DATA:  Lower abdominal pain. EXAM: CT ABDOMEN AND PELVIS WITH CONTRAST TECHNIQUE: Multidetector CT imaging of the abdomen and pelvis was performed using the standard protocol following bolus administration of intravenous contrast. CONTRAST:  156mL OMNIPAQUE IOHEXOL 300 MG/ML  SOLN COMPARISON:  05/12/2019 and 11/25/2018 FINDINGS: Lower Chest: Mild patchy areas of opacity  are seen in the peripheral aspects of the right lower lobe which are new since previous study, suspicious for infectious or inflammatory process. Hepatobiliary: Moderate diffuse hepatic steatosis is again seen. A homogeneous hypervascular mass is seen in the posterior right hepatic lobe which measures 1.9 x 1.4 cm on image 28/3. This is stable since previous study and consistent with benign etiology. No other liver lesions identified.  Gallbladder is unremarkable. No evidence of biliary ductal dilatation. Pancreas:  No mass or inflammatory changes. Spleen: Within normal limits in size and appearance. Adrenals/Urinary Tract: The adrenal glands and right kidney are normal in appearance. Left pelvic kidney is again seen, with tiny renal cyst, however there is no evidence of mass or hydronephrosis. Unremarkable unopacified urinary bladder. Stomach/Bowel: No evidence of obstruction, inflammatory process or abnormal fluid collections. Vascular/Lymphatic: No pathologically enlarged lymph nodes. No abdominal aortic aneurysm. Reproductive: Prior hysterectomy noted. Adnexal regions are unremarkable in appearance. Other:  None. Musculoskeletal:  No suspicious bone lesions identified. IMPRESSION: No acute findings within the abdomen or pelvis. Stable moderate hepatic steatosis. Stable small hypervascular mass in the posterior right hepatic lobe, consistent with benign etiology. Mild patchy opacities in peripheral right lower lobe, suspicious for pneumonia or inflammatory process. Electronically Signed   By: Marlaine Hind M.D.   On: 01/15/2020 18:29    I personally reviewed and interpreted all labs.  Patient is hemodynamically stable, in NAD, and able to ambulate in the ED. Evaluation does not show pathology that would require ongoing emergent intervention or inpatient treatment. I explained the diagnosis to the patient. Pain has been managed and has no complaints prior to discharge. Patient is comfortable with above plan and is stable for discharge at this time. All questions were answered prior to disposition. Strict return precautions for returning to the ED were discussed. Encouraged follow up with PCP.   An After Visit Summary was printed and given to the patient.   Portions of this note were generated with [employer]. Dictation errors may occur despite best attempts at proofreading.    Delia Heady, PA-C 01/15/20 1934     Breck Coons, MD 01/15/20 2222

## 2020-01-15 NOTE — ED Provider Notes (Signed)
[place] EMERGENCY DEPARTMENT Provider Note   CSN: [id number] Arrival date & time: 01/15/20  1116     History Chief Complaint  Patient presents with  . Abdominal Pain    Morgan [name] is a 54 y.o. female past medical history of type 2 diabetes, hypertension, hyperlipidemia, presenting to the emergency department with complaint of abdominal pain that began 2 days ago.  Pain is sharp and crampy in nature, constant, 8/10 severity.  Pain is to her bilateral upper and lower abdomen.  Eating makes her symptoms worse.  She is nauseous without vomiting.  No diarrhea or constipation, no fevers, no urinary symptoms.  She had hysterectomy about 6 months ago without complication.  The history is provided by the patient.       Past Medical History:  Diagnosis Date  . Anxiety   . Bipolar disorder ([place])   . Breast mass    right  . Cervical dysplasia   . Diabetes mellitus without complication ([place])   . Endometriosis   . GERD (gastroesophageal reflux disease)   . Hypercholesteremia   . Hypertension   . Migraine   . Pelvic kidney    left  . Postpartum depression    hx of    Patient Active Problem List   Diagnosis Date Noted  . History of endometrial hyperplasia 08/12/2019  . Fatty liver disease, nonalcoholic 30/86/5784  . History of robot-assisted laparoscopic hysterectomy 07/25/2019  . Cervical dysplasia 04/18/2019  . Diabetes type 2, uncontrolled ([place]) 04/18/2019  . High cholesterol 04/18/2019  . Chest pain 06/14/2017  . Hypertension complicating diabetes ([place]) 06/14/2017  . Type 2 diabetes mellitus with other specified complication ([place]) 69/62/9528  . Hyperlipidemia associated with type 2 diabetes mellitus ([place]) 06/14/2017  . Bipolar disorder [facility])     Past Surgical History:  Procedure Laterality Date  . ABDOMINAL HYSTERECTOMY N/A    Phreesia 07/30/2019  . BACK SURGERY  2014  . BREAST LUMPECTOMY WITH RADIOACTIVE SEED LOCALIZATION Right 10/28/2014     Procedure: RIGHT BREAST LUMPECTOMY WITH RADIOACTIVE SEED LOCALIZATION;  Surgeon: [name], MD;  Location: [place];  Service: General;  Laterality: Right;  . BREAST LUMPECTOMY WITH RADIOACTIVE SEED LOCALIZATION Right 06/18/2019   Procedure: RIGHT BREAST RADIOACTIVE SEED LOCALIZATION LUMPECTOMY;  Surgeon: Jovita Kussmaul, MD;  Location: [place];  Service: General;  Laterality: Right;  . BREAST SURGERY N/A    Phreesia 07/30/2019  . CESAREAN SECTION  03  . COLONOSCOPY WITH PROPOFOL N/A 05/01/2018   Procedure: COLONOSCOPY WITH PROPOFOL;  Surgeon: [name], MD;  Location: [facility] ENDOSCOPY;  Service: Gastroenterology;  Laterality: N/A;  . COLPOSCOPY    . ESOPHAGOGASTRODUODENOSCOPY (EGD) WITH PROPOFOL N/A 05/01/2018   Procedure: ESOPHAGOGASTRODUODENOSCOPY (EGD) WITH PROPOFOL;  Surgeon: [name], MD;  Location: [facility] ENDOSCOPY;  Service: Gastroenterology;  Laterality: N/A;  . ESOPHAGOGASTRODUODENOSCOPY (EGD) WITH PROPOFOL N/A 11/13/2018   Procedure: ESOPHAGOGASTRODUODENOSCOPY (EGD) WITH PROPOFOL;  Surgeon: [name], MD;  Location: [facility] ENDOSCOPY;  Service: Gastroenterology;  Laterality: N/A;  . GYNECOLOGIC CRYOSURGERY    . NECK SURGERY  2012  . PELVIC LAPAROSCOPY  89     OB History    Gravida  1   Para  1   Term  1   Preterm      AB  0   Living  1     SAB      TAB      Ectopic  0   Multiple  Live Births              Family History  Problem Relation Age of Onset  . Diabetes Mother   . Breast cancer Mother 33  . Thyroid disease Mother   . Depression Mother   . Hypertension Father   . Heart disease Father   . Heart attack Father 34       Two instances, first at age 26  . Stroke Father   . Alcohol abuse Father   . Throat cancer Father   . Diabetes Maternal Grandmother   . Depression Maternal Grandmother   . Uterine cancer Maternal Grandmother     Social History   Tobacco Use  . Smoking status: Never Smoker  .  Smokeless tobacco: Never Used  Vaping Use  . Vaping Use: Never used  Substance Use Topics  . Alcohol use: No  . Drug use: No    Home Medications Prior to Admission medications   Medication Sig Start Date End Date Taking? Authorizing Provider  ARIPiprazole (ABILIFY) 15 MG tablet Take 1 tablet by mouth at bedtime. 12/21/17  Yes Noemi Chapel, NP  atorvastatin (LIPITOR) 40 MG tablet Take 1 tablet (40 mg total) by mouth daily. **NEEDS APT FOR FURTHER REFILLS** 11/01/19  Yes Abonza, Maritza, PA-C  butalbital-acetaminophen-caffeine (FIORICET) 50-325-40 MG tablet Take 1-2 tablets by mouth every 6 (six) hours as needed for headache. 11/16/19 11/15/20 Yes Harris, Abigail, PA-C  clonazePAM (KLONOPIN) 1 MG tablet Take 0.5 mg by mouth daily as needed for anxiety.  07/09/19  Yes [provider]  Dapagliflozin-metFORMIN HCl ER (XIGDUO XR) 10-500 MG TB24 Take 1 tablet by mouth daily. 07/31/19  Yes Abonza, Maritza, PA-C  DAYVIGO 5 MG TABS Take 1 tablet by mouth at bedtime as needed. 07/09/19  Yes [provider]  DULoxetine (CYMBALTA) 60 MG capsule Take 60 mg by mouth at bedtime. 11/04/16  Yes [provider]  HYDROcodone-acetaminophen (NORCO/VICODIN) 5-325 MG tablet Take 1-2 tablets by mouth every 6 (six) hours as needed for moderate pain or severe pain. 06/18/19  Yes [name], MD  insulin aspart (NOVOLOG) 100 UNIT/ML injection Inject 10 Units into the skin 3 (three) times daily before meals. 07/31/19 01/15/20 Yes Abonza, Maritza, PA-C  Insulin Syringes, Disposable, U-100 1 ML MISC Use as instructed. Inject into the skin three times per day 12/12/18  Yes Gildardo Pounds, NP  losartan (COZAAR) 100 MG tablet Take 1 tablet daily by mouth. 07/31/19  Yes Abonza, Maritza, PA-C  omeprazole (PRILOSEC) 20 MG capsule Take 20 mg by mouth at bedtime.    Yes [provider]    Allergies    Clindamycin/lincomycin, Other, and Lamotrigine  Review of Systems   Review of Systems  All other  systems reviewed and are negative.   Physical Exam Updated Vital Signs BP (!) 145/80   Pulse 72   Temp 98.1 F (36.7 C) (Oral)   Resp 16   Ht 5\' 1"  (1.549 m)   Wt 81.6 kg   LMP 05/12/2011   SpO2 98%   BMI 34.01 kg/m   Physical Exam Vitals and nursing note reviewed.  Constitutional:      General: She is not in acute distress.    Appearance: She is well-developed. She is obese. She is not ill-appearing.  HENT:     Head: Normocephalic and atraumatic.  Eyes:     Conjunctiva/sclera: Conjunctivae normal.  Cardiovascular:     Rate and Rhythm: Normal rate and regular rhythm.  Pulmonary:  Effort: Pulmonary effort is normal. No respiratory distress.     Breath sounds: Normal breath sounds.  Abdominal:     General: Bowel sounds are normal.     Palpations: Abdomen is soft.     Tenderness: There is abdominal tenderness in the epigastric area and left upper quadrant. There is no guarding or rebound.  Skin:    General: Skin is warm.  Neurological:     Mental Status: She is alert.  Psychiatric:        Behavior: Behavior normal.     ED Results / Procedures / Treatments   Labs (all labs ordered are listed, but only abnormal results are displayed) Labs Reviewed  COMPREHENSIVE METABOLIC PANEL - Abnormal; Notable for the following components:      Result Value   Potassium 3.2 (*)    Glucose, Bld 145 (*)    All other components within normal limits  CBC - Abnormal; Notable for the following components:   RBC 5.45 (*)    MCH 24.4 (*)    RDW 15.6 (*)    All other components within normal limits  URINALYSIS, ROUTINE W REFLEX MICROSCOPIC - Abnormal; Notable for the following components:   Glucose, UA >=500 (*)    Bacteria, UA RARE (*)    All other components within normal limits  LIPASE, BLOOD  DIFFERENTIAL    EKG None  Radiology No results found.  Procedures Procedures (including critical care time)  Medications Ordered in ED Medications  ondansetron (ZOFRAN)  injection 4 mg (4 mg Intravenous Given 01/15/20 1301)  morphine 2 MG/ML injection 2 mg (2 mg Intravenous Given 01/15/20 1301)  sodium chloride 0.9 % bolus 500 mL (0 mLs Intravenous Stopped 01/15/20 1412)    ED Course  I have reviewed the triage vital signs and the nursing notes.  Pertinent labs & imaging results that were available during my care of the patient were reviewed by me and considered in my medical decision making (see chart for details).    MDM Rules/Calculators/A&P                          Presenting for 2 days of upper and lower abdominal pain with associated nausea.  No bowel changes, fevers or chills.  Remote history of diverticulitis.  On exam she is well-appearing in no distress, afebrile.  Abdomen is soft with tenderness in the epigastrium and left quadrants.  No guarding or rebound.  Labs are unremarkable.  However CT scan obtained to evaluate for possible diverticulitis or other acute intra-abdominal pathology.  Symptoms treated in the ED.    Care assumed at shift change by PA Khatri to follow imaging and re-eval for appropriate disposition. Final Clinical Impression(s) / ED Diagnoses Final diagnoses:  None    Rx / DC Orders ED Discharge Orders    None       Caelie [place], [place] N, PA-C 01/15/20 1548    Gareth Morgan, MD 01/18/20 [id number]

## 2020-01-15 NOTE — Discharge Instructions (Addendum)
Take medications as needed to help with your symptoms. Follow-up with your primary care provider. Return to the ER for worsening abdominal pain, diarrhea, chest pain or shortness of breath.

## 2020-01-15 NOTE — ED Triage Notes (Signed)
Patient arrives to ED with complaints generalized abdominal cramping. Pt describes the pain as achy. Pain started yesterday after eating pot roast. Associated symtopms are nausea. No diarrhea or emesis. Pain is constant. No CP or SOB.

## 2020-03-24 ENCOUNTER — Ambulatory Visit (INDEPENDENT_AMBULATORY_CARE_PROVIDER_SITE_OTHER): Payer: Federal, State, Local not specified - PPO | Admitting: Physician Assistant

## 2020-03-24 ENCOUNTER — Other Ambulatory Visit: Payer: Self-pay

## 2020-03-24 ENCOUNTER — Encounter: Payer: Self-pay | Admitting: Physician Assistant

## 2020-03-24 VITALS — BP 121/75 | HR 84 | Temp 97.6°F | Ht 61.0 in | Wt 187.6 lb

## 2020-03-24 DIAGNOSIS — Z794 Long term (current) use of insulin: Secondary | ICD-10-CM

## 2020-03-24 DIAGNOSIS — E1159 Type 2 diabetes mellitus with other circulatory complications: Secondary | ICD-10-CM

## 2020-03-24 DIAGNOSIS — Z0000 Encounter for general adult medical examination without abnormal findings: Secondary | ICD-10-CM | POA: Diagnosis not present

## 2020-03-24 DIAGNOSIS — I152 Hypertension secondary to endocrine disorders: Secondary | ICD-10-CM

## 2020-03-24 DIAGNOSIS — E785 Hyperlipidemia, unspecified: Secondary | ICD-10-CM

## 2020-03-24 DIAGNOSIS — E1169 Type 2 diabetes mellitus with other specified complication: Secondary | ICD-10-CM | POA: Diagnosis not present

## 2020-03-24 NOTE — Progress Notes (Signed)
Female Physical   Impression and Recommendations:    1. Healthcare maintenance   2. Type 2 diabetes mellitus with other specified complication, with long-term current use of insulin ([place])   3. Hyperlipidemia associated with type 2 diabetes mellitus ([place])   4. Hypertension complicating diabetes ([place])      1) Anticipatory Guidance: Skin CA prevention- recommend to use sunscreen when outside along with skin surveillance; eating a balanced and modest diet; physical activity at least 25 minutes per day or minimum of 150 min/ week moderate to intense activity.  2) Immunizations / Screenings / Labs:   All immunizations are up-to-date per recommendations or will be updated today if pt allows.    - Patient understands with dental and vision screens they will schedule independently.  - Will obtain CBC, CMP, HgA1c, Lipid panel, and TSH when fasting. - UTD on mammogram, colonoscopy, Tdap and HIV screening.  Declined Shingrix, hep C screening, influenza and Covid vaccines.  3) Weight:  Recommend  to improve diet habits to improve overall feelings of well being and objective health data. Improve nutrient density of diet through increasing intake of fruits and vegetables and decreasing saturated fats, white flour products and refined sugars.  4 Healthcare maintenance: -Continue current medication regimen. -Recommend to continue to follow-up with various specialists including psychiatry and orthopedics. -Follow a heart healthy diet and stay as active as possible.  Continue to monitor carbohydrates and glucose. -Follow-up in 4 months for DM, HTN, HLD  No orders of the defined types were placed in this encounter.   Orders Placed This Encounter  Procedures  . CBC  . Comprehensive metabolic panel  . TSH  . Lipid panel  . Hemoglobin A1c  . Urine Microalbumin w/creat. ratio     Return in about 4 months (around 07/22/2020) for DM, HTN, HLD.     Gross side effects, risk and benefits, and  alternatives of medications discussed with patient.  Patient is aware that all medications have potential side effects and we are unable to predict every side effect or drug-drug interaction that may occur.  Expresses verbal understanding and consents to current therapy plan and treatment regimen.  F-up preventative CPE in 1 year- this is in addition to any chronic care visits.    Please see orders placed and AVS handed out to patient at the end of our visit for further patient instructions/ counseling done pertaining to today's office visit.     Subjective:     CPE HPI: Morgan [name] is a 55 y.o. female who presents to [place] [facility] today for a yearly health maintenance exam.   Health Maintenance Summary  - Reviewed and updated, unless pt declines services.  Last Cologuard or Colonoscopy:    04/21/2018- repeat in 5 years Tobacco History Reviewed:  Y, never a smoker Alcohol and/or drug use:    No concerns; no use Exercise Habits:   Not meeting AHA guidelines Dental Home:Y Eye exams:Y Dermatology home:N  Female Health:  PAP Smear - last known results: s/p total hysterectomy. Followed by Ob-Gyn STD concerns:   none Birth control method: s/p hysterectomy  Menses regular:  n/a Lumps or breast concerns: None none, mammogram due 05/2020 Breast Cancer Family History:  Y, mother   Additional concerns beyond health maintenance issues: none    Immunization History  Administered Date(s) Administered  . Hep A / Hep B 08/10/2018  . Pneumococcal Polysaccharide-23 12/06/2018     Health Maintenance  Topic Date Due  .  OPHTHALMOLOGY EXAM  03/04/2019  . HEMOGLOBIN A1C  06/06/2019  . COVID-19 Vaccine (1) 04/09/2020 (Originally 09/27/1977)  . INFLUENZA VACCINE  05/15/2020 (Originally 09/16/2019)  . Hepatitis C Screening  03/24/2021 (Originally 10-08-1965)  . FOOT EXAM  03/24/2021  . MAMMOGRAM  05/24/2021  . COLONOSCOPY (Pts 45-38yrs Insurance coverage will  need to be confirmed)  05/01/2023  . PAP SMEAR-Modifier  05/21/2024  . TETANUS/TDAP  02/15/2025  . PNEUMOCOCCAL POLYSACCHARIDE VACCINE AGE 19-64 HIGH RISK  Completed  . HIV Screening  Completed     Wt Readings from Last 3 Encounters:  03/24/20 187 lb 9.6 oz (85.1 kg)  01/15/20 180 lb (81.6 kg)  11/15/19 184 lb 15.5 oz (83.9 kg)   BP Readings from Last 3 Encounters:  03/24/20 121/75  01/15/20 129/82  11/16/19 120/62   Pulse Readings from Last 3 Encounters:  03/24/20 84  01/15/20 72  11/16/19 64     Past Medical History:  Diagnosis Date  . Anxiety   . Bipolar disorder ([place])   . Breast mass    right  . Cervical dysplasia   . Diabetes mellitus without complication ([place])   . Endometriosis   . GERD (gastroesophageal reflux disease)   . Hypercholesteremia   . Hypertension   . Migraine   . Pelvic kidney    left  . Postpartum depression    hx of      Past Surgical History:  Procedure Laterality Date  . ABDOMINAL HYSTERECTOMY N/A    Phreesia 07/30/2019  . BACK SURGERY  2014  . BREAST LUMPECTOMY WITH RADIOACTIVE SEED LOCALIZATION Right 10/28/2014   Procedure: RIGHT BREAST LUMPECTOMY WITH RADIOACTIVE SEED LOCALIZATION;  Surgeon: [name], MD;  Location: [place];  Service: General;  Laterality: Right;  . BREAST LUMPECTOMY WITH RADIOACTIVE SEED LOCALIZATION Right 06/18/2019   Procedure: RIGHT BREAST RADIOACTIVE SEED LOCALIZATION LUMPECTOMY;  Surgeon: Jovita Kussmaul, MD;  Location: [place];  Service: General;  Laterality: Right;  . BREAST SURGERY N/A    Phreesia 07/30/2019  . CESAREAN SECTION  03  . COLONOSCOPY WITH PROPOFOL N/A 05/01/2018   Procedure: COLONOSCOPY WITH PROPOFOL;  Surgeon: [name], MD;  Location: [facility] ENDOSCOPY;  Service: Gastroenterology;  Laterality: N/A;  . COLPOSCOPY    . ESOPHAGOGASTRODUODENOSCOPY (EGD) WITH PROPOFOL N/A 05/01/2018   Procedure: ESOPHAGOGASTRODUODENOSCOPY (EGD) WITH PROPOFOL;  Surgeon: [name], MD;  Location: [facility] ENDOSCOPY;  Service: Gastroenterology;  Laterality: N/A;  . ESOPHAGOGASTRODUODENOSCOPY (EGD) WITH PROPOFOL N/A 11/13/2018   Procedure: ESOPHAGOGASTRODUODENOSCOPY (EGD) WITH PROPOFOL;  Surgeon: [name], MD;  Location: [facility] ENDOSCOPY;  Service: Gastroenterology;  Laterality: N/A;  . GYNECOLOGIC CRYOSURGERY    . NECK SURGERY  2012  . PELVIC LAPAROSCOPY  56      Family History  Problem Relation Age of Onset  . Diabetes Mother   . Breast cancer Mother 77  . Thyroid disease Mother   . Depression Mother   . Hypertension Father   . Heart disease Father   . Heart attack Father 98       Two instances, first at age 42  . Stroke Father   . Alcohol abuse Father   . Throat cancer Father   . Diabetes Maternal Grandmother   . Depression Maternal Grandmother   . Uterine cancer Maternal Grandmother       Social History   Substance and Sexual Activity  Drug Use No  ,   Social History   Substance and Sexual Activity  Alcohol Use No  ,  Social History   Tobacco Use  Smoking Status Never Smoker  Smokeless Tobacco Never Used  ,   Social History   Substance and Sexual Activity  Sexual Activity Yes  . Partners: Male  . Birth control/protection: None   Comment: vasectomy    Current Outpatient Medications on File Prior to Visit  Medication Sig Dispense Refill  . ARIPiprazole (ABILIFY) 15 MG tablet Take 1 tablet by mouth at bedtime.    Marland Kitchen atorvastatin (LIPITOR) 40 MG tablet Take 1 tablet (40 mg total) by mouth daily. **NEEDS APT FOR FURTHER REFILLS** 60 tablet 0  . butalbital-acetaminophen-caffeine (FIORICET) 50-325-40 MG tablet Take 1-2 tablets by mouth every 6 (six) hours as needed for headache. 20 tablet 0  . clonazePAM (KLONOPIN) 1 MG tablet Take 0.5 mg by mouth daily as needed for anxiety.     . Dapagliflozin-metFORMIN HCl ER (XIGDUO XR) 10-500 MG TB24 Take 1 tablet by mouth daily. 90 tablet 1  . DULoxetine (CYMBALTA) 60 MG capsule Take 60 mg by  mouth at bedtime.  5  . HYDROcodone-acetaminophen (NORCO/VICODIN) 5-325 MG tablet Take 1-2 tablets by mouth every 6 (six) hours as needed for moderate pain or severe pain. 10 tablet 0  . losartan (COZAAR) 100 MG tablet Take 1 tablet daily by mouth. 90 tablet 1  . meloxicam (MOBIC) 15 MG tablet TAKE 1 TABLET BY MOUTH ONCE A DAY AS DIRECTED FOR 10 TO 14 DAYS, THEN AS NEEDED    . omeprazole (PRILOSEC) 20 MG capsule Take 20 mg by mouth at bedtime.     . DAYVIGO 5 MG TABS Take 1 tablet by mouth at bedtime as needed.    . dicyclomine (BENTYL) 20 MG tablet Take 1 tablet (20 mg total) by mouth 2 (two) times daily. 10 tablet 0  . insulin aspart (NOVOLOG) 100 UNIT/ML injection Inject 10 Units into the skin 3 (three) times daily before meals. 10 mL 2  . Insulin Syringes, Disposable, U-100 1 ML MISC Use as instructed. Inject into the skin three times per day 200 each 3  . ondansetron (ZOFRAN ODT) 4 MG disintegrating tablet Take 1 tablet (4 mg total) by mouth every 8 (eight) hours as needed for nausea or vomiting. 5 tablet 0   No current facility-administered medications on file prior to visit.    Allergies: Clindamycin/lincomycin, Other, and Lamotrigine  Review of Systems: General:   Denies fever, chills, unexplained weight loss.  Optho/Auditory:   Denies visual changes, blurred vision/LOV Respiratory:   Denies SOB, DOE more than baseline levels.   Cardiovascular:   Denies chest pain, palpitations, new onset peripheral edema  Gastrointestinal:   Denies nausea, vomiting, diarrhea.  Genitourinary: Denies dysuria, freq/ urgency, flank pain or discharge from genitals.  Endocrine:     Denies hot or cold intolerance, polyuria, polydipsia. Musculoskeletal:   Denies unexplained myalgias, joint swelling, + arthralgias (left sho)  Skin:  Denies rash, suspicious lesions Neurological:     Denies dizziness, unexplained weakness, numbness  Psychiatric/Behavioral:   Denies mood changes, suicidal or homicidal  ideations, hallucinations    Objective:    Blood pressure 121/75, pulse 84, temperature 97.6 F (36.4 C), height 5\' 1"  (1.549 m), weight 187 lb 9.6 oz (85.1 kg), last menstrual period 05/12/2011, SpO2 97 %. Body mass index is 35.45 kg/m. General Appearance:    Alert, cooperative, no distress, appears stated age  Head:    Normocephalic, without obvious abnormality, atraumatic  Eyes:    PERRL, conjunctiva/corneas clear, EOM's intact, both eyes  Ears:  Normal TM's and external ear canals, both ears  Nose:   Nares normal, septum midline, mucosa normal, no drainage    or sinus tenderness  Throat:   Lips w/o lesion, mucosa moist, and tongue normal; teeth and   gums normal  Neck:   Supple, symmetrical, trachea midline, no adenopathy;    thyroid:  no enlargement/tenderness/nodules; no JVD  Back:     Symmetric, no curvature, ROM normal, no CVA tenderness  Lungs:     Clear to auscultation bilaterally, respirations unlabored, no       Wh/ R/ R  Chest Wall:    No tenderness or gross deformity; normal excursion   Heart:    Regular rate and rhythm, S1 and S2 normal, no murmur, rub   or gallop  Breast Exam:    Deferred to Ob-Gyn  Abdomen:     Soft, non-tender, bowel sounds active all four quadrants, No   G/R/R, no masses, no organomegaly; well healed surgical scars noted  Genitalia:   Deferred to Ob-Gyn  Rectal:   Deferred to Ob-Gyn  Extremities:   Extremities normal, atraumatic, no cyanosis or gross edema  Pulses:   2+ and symmetric all extremities  Skin:   Warm, dry, Skin color, texture, turgor normal, no obvious rashes or lesions Psych: No HI/SI, judgement and insight good, Euthymic mood. Full Affect.  Neurologic:   CNII-XII grossly intact, normal strength, sensation and reflexes throughout

## 2020-03-24 NOTE — Patient Instructions (Signed)
Preventive Care 53-53 Years Old, Female Preventive care refers to lifestyle choices and visits with your health care provider that can promote health and wellness. This includes:  A yearly physical exam. This is also called an annual wellness visit.  Regular dental and eye exams.  Immunizations.  Screening for certain conditions.  Healthy lifestyle choices, such as: ? Eating a healthy diet. ? Getting regular exercise. ? Not using drugs or products that contain nicotine and tobacco. ? Limiting alcohol use. What can I expect for my preventive care visit? Physical exam Your health care provider will check your:  Height and weight. These may be used to calculate your BMI (body mass index). BMI is a measurement that tells if you are at a healthy weight.  Heart rate and blood pressure.  Body temperature.  Skin for abnormal spots. Counseling Your health care provider may ask you questions about your:  Past medical problems.  Family's medical history.  Alcohol, tobacco, and drug use.  Emotional well-being.  Home life and relationship well-being.  Sexual activity.  Diet, exercise, and sleep habits.  Work and work Statistician.  Access to firearms.  Method of birth control.  Menstrual cycle.  Pregnancy history. What immunizations do I need? Vaccines are usually given at various ages, according to a schedule. Your health care provider will recommend vaccines for you based on your age, medical history, and lifestyle or other factors, such as travel or where you work.   What tests do I need? Blood tests  Lipid and cholesterol levels. These may be checked every 5 years, or more often if you are over 53 years old.  Hepatitis C test.  Hepatitis B test. Screening  Lung cancer screening. You may have this screening every year starting at age 53 if you have a 30-pack-year history of smoking and currently smoke or have quit within the past 15 years.  Colorectal cancer  screening. ? All adults should have this screening starting at age 53 and continuing until age 17. ? Your health care provider may recommend screening at age 53 if you are at increased risk. ? You will have tests every 1-10 years, depending on your results and the type of screening test.  Diabetes screening. ? This is done by checking your blood sugar (glucose) after you have not eaten for a while (fasting). ? You may have this done every 1-3 years.  Mammogram. ? This may be done every 1-2 years. ? Talk with your health care provider about when you should start having regular mammograms. This may depend on whether you have a family history of breast cancer.  BRCA-related cancer screening. This may be done if you have a family history of breast, ovarian, tubal, or peritoneal cancers.  Pelvic exam and Pap test. ? This may be done every 3 years starting at age 53. ? Starting at age 53, this may be done every 5 years if you have a Pap test in combination with an HPV test. Other tests  STD (sexually transmitted disease) testing, if you are at risk.  Bone density scan. This is done to screen for osteoporosis. You may have this scan if you are at high risk for osteoporosis. Talk with your health care provider about your test results, treatment options, and if necessary, the need for more tests. Follow these instructions at home: Eating and drinking  Eat a diet that includes fresh fruits and vegetables, whole grains, lean protein, and low-fat dairy products.  Take vitamin and mineral supplements  as recommended by your health care provider.  Do not drink alcohol if: ? Your health care provider tells you not to drink. ? You are pregnant, may be pregnant, or are planning to become pregnant.  If you drink alcohol: ? Limit how much you have to 0-1 drink a day. ? Be aware of how much alcohol is in your drink. In the [place] one drink equals one 12 oz bottle of beer (355 mL), one 5 oz glass of  wine (148 mL), or one 1 oz glass of hard liquor (44 mL).   Lifestyle  Take daily care of your teeth and gums. Brush your teeth every morning and night with fluoride toothpaste. Floss one time each day.  Stay active. Exercise for at least 30 minutes 5 or more days each week.  Do not use any products that contain nicotine or tobacco, such as cigarettes, e-cigarettes, and chewing tobacco. If you need help quitting, ask your health care provider.  Do not use drugs.  If you are sexually active, practice safe sex. Use a condom or other form of protection to prevent STIs (sexually transmitted infections).  If you do not wish to become pregnant, use a form of birth control. If you plan to become pregnant, see your health care provider for a prepregnancy visit.  If told by your health care provider, take low-dose aspirin daily starting at age 50.  Find healthy ways to cope with stress, such as: ? Meditation, yoga, or listening to music. ? Journaling. ? Talking to a trusted person. ? Spending time with friends and family. Safety  Always wear your seat belt while driving or riding in a vehicle.  Do not drive: ? If you have been drinking alcohol. Do not ride with someone who has been drinking. ? When you are tired or distracted. ? While texting.  Wear a helmet and other protective equipment during sports activities.  If you have firearms in your house, make sure you follow all gun safety procedures. What's next?  Visit your health care provider once a year for an annual wellness visit.  Ask your health care provider how often you should have your eyes and teeth checked.  Stay up to date on all vaccines. This information is not intended to replace advice given to you by your health care provider. Make sure you discuss any questions you have with your health care provider. Document Revised: 11/06/2019 Document Reviewed: 10/13/2017 Elsevier Patient Education  2021 Elsevier Inc.  

## 2020-03-25 LAB — COMPREHENSIVE METABOLIC PANEL
ALT: 27 IU/L (ref 0–32)
AST: 26 IU/L (ref 0–40)
Albumin/Globulin Ratio: 1.8 (ref 1.2–2.2)
Albumin: 4.1 g/dL (ref 3.8–4.9)
Alkaline Phosphatase: 142 IU/L — ABNORMAL HIGH (ref 44–121)
BUN/Creatinine Ratio: 11 (ref 9–23)
BUN: 7 mg/dL (ref 6–24)
Bilirubin Total: 0.5 mg/dL (ref 0.0–1.2)
CO2: 24 mmol/L (ref 20–29)
Calcium: 9.5 mg/dL (ref 8.7–10.2)
Chloride: 103 mmol/L (ref 96–106)
Creatinine, Ser: 0.66 mg/dL (ref 0.57–1.00)
GFR calc Af Amer: 117 mL/min/{1.73_m2} (ref >59)
GFR calc non Af Amer: 102 mL/min/{1.73_m2} (ref >59)
Globulin, Total: 2.3 g/dL (ref 1.5–4.5)
Glucose: 121 mg/dL — ABNORMAL HIGH (ref 65–99)
Potassium: 4 mmol/L (ref 3.5–5.2)
Sodium: 140 mmol/L (ref 134–144)
Total Protein: 6.4 g/dL (ref 6.0–8.5)

## 2020-03-25 LAB — LIPID PANEL
Chol/HDL Ratio: 3.5 ratio (ref 0.0–4.4)
Cholesterol, Total: 138 mg/dL (ref 100–199)
HDL: 40 mg/dL (ref >39)
LDL Chol Calc (NIH): 73 mg/dL (ref 0–99)
Triglycerides: 140 mg/dL (ref 0–149)
VLDL Cholesterol Cal: 25 mg/dL (ref 5–40)

## 2020-03-25 LAB — CBC
Hematocrit: 39.2 % (ref 34.0–46.6)
Hemoglobin: 12.7 g/dL (ref 11.1–15.9)
MCH: 24.6 pg — ABNORMAL LOW (ref 26.6–33.0)
MCHC: 32.4 g/dL (ref 31.5–35.7)
MCV: 76 fL — ABNORMAL LOW (ref 79–97)
Platelets: 232 10*3/uL (ref 150–450)
RBC: 5.17 x10E6/uL (ref 3.77–5.28)
RDW: 15.6 % — ABNORMAL HIGH (ref 11.7–15.4)
WBC: 5.6 10*3/uL (ref 3.4–10.8)

## 2020-03-25 LAB — MICROALBUMIN / CREATININE URINE RATIO
Creatinine, Urine: 87.4 mg/dL
Microalb/Creat Ratio: 17 mg/g creat (ref 0–29)
Microalbumin, Urine: 15.2 ug/mL

## 2020-03-25 LAB — HEMOGLOBIN A1C
Est. average glucose Bld gHb Est-mCnc: 151 mg/dL
Hgb A1c MFr Bld: 6.9 % — ABNORMAL HIGH (ref 4.8–5.6)

## 2020-03-25 LAB — TSH: TSH: 1.64 u[IU]/mL (ref 0.450–4.500)

## 2020-04-12 ENCOUNTER — Encounter: Payer: Self-pay | Admitting: Emergency Medicine

## 2020-04-12 ENCOUNTER — Ambulatory Visit (INDEPENDENT_AMBULATORY_CARE_PROVIDER_SITE_OTHER): Payer: Federal, State, Local not specified - PPO

## 2020-04-12 ENCOUNTER — Other Ambulatory Visit: Payer: Self-pay

## 2020-04-12 ENCOUNTER — Ambulatory Visit
Admission: EM | Admit: 2020-04-12 | Discharge: 2020-04-12 | Disposition: A | Discharge status: Home / Self Care | Payer: Federal, State, Local not specified - PPO | Attending: Family Medicine | Admitting: Family Medicine

## 2020-04-12 DIAGNOSIS — R5383 Other fatigue: Secondary | ICD-10-CM

## 2020-04-12 DIAGNOSIS — R0602 Shortness of breath: Secondary | ICD-10-CM | POA: Diagnosis not present

## 2020-04-12 NOTE — ED Triage Notes (Signed)
Patient c/o fatigue, SOB, and lump on LFT shoulder x 5 days.   Patient denies fever at home.   Patient denies any history of COPD or Asthma.   Patient states "nothing makes my symptoms feel worst, I just feel bad all the time".   Patient has a history of DM.

## 2020-04-12 NOTE — Discharge Instructions (Signed)
You have been seen at the [facility] today for shortness of breath and fatigue. Your evaluation today was not suggestive of any emergent condition requiring medical intervention at this time. Your chest x-ray and ECG (heart tracing) did not show any worrisome changes. However, some medical problems make take more time to appear. Therefore, it's very important that you pay attention to any new symptoms or worsening of your current condition.  Please proceed directly to the Emergency Department immediately should you feel worse in any way or have any of the following symptoms: chest pain, abdominal pain, persistent or worsening shortness of breath.

## 2020-04-14 ENCOUNTER — Other Ambulatory Visit: Payer: Self-pay | Admitting: Physician Assistant

## 2020-04-14 DIAGNOSIS — E1169 Type 2 diabetes mellitus with other specified complication: Secondary | ICD-10-CM

## 2020-04-14 DIAGNOSIS — E785 Hyperlipidemia, unspecified: Secondary | ICD-10-CM

## 2020-04-14 NOTE — ED Provider Notes (Signed)
[place]   [id number] 04/12/20 Arrival Time: 3474  ASSESSMENT & PLAN:  1. SOB (shortness of breath)   2. Fatigue, unspecified type     As for fatigue, etc question viral illness.  I have personally viewed the imaging studies ordered this visit. No acute changes on CXR.  ECG: NSR. No STEMI.  As for apparent soft mass of L neck/chest, will need to schedule f/u for imaging. Prefers PCP.   Follow-up Information    Schedule an appointment as soon as possible for a visit  with Lorrene Reid, PA-C.   Specialty: Physician Assistant Contact information: [place] [address] [phone]               Reviewed expectations re: course of current medical issues. Questions answered. Outlined signs and symptoms indicating need for more acute intervention. Understanding verbalized. After Visit Summary given.   SUBJECTIVE: History from: patient. Morgan [name] is a 55 y.o. female who reports generalized fatigue, cough, congestion, occas SOB; abrupt onset 4-5 d. No current SOB. No associated CP/n/diaphoresis. Afebrile. With body aches past couple of days. Also with "lump" of L neck; first noted this week. No injury. Not painful. No OTC tx.   OBJECTIVE:  Vitals:   04/12/20 1429  BP: 131/85  Pulse: (!) 102  Resp: (!) 22  Temp: 98 F (36.7 C)  TempSrc: Oral  SpO2: 96%    Recheck Pulse: 94 General appearance: alert; no distress; appears fatigued Eyes: PERRLA; EOMI; conjunctiva normal HENT: [place]; AT; with nasal congestion Neck: supple; base of L neck at chest with apparent several cm soft tissue mass; question lipoma; no overlying erythema; non-tender Lungs: speaks full sentences without difficulty; unlabored; no wheezing; CTAB Extremities: no edema Skin: warm and dry Neurologic: normal gait Psychological: alert and cooperative; normal mood and affect  Imaging: DG Chest 2 View  Result Date: 04/12/2020 CLINICAL DATA:   Shortness of breath and fatigue x5 days EXAM: CHEST - 2 VIEW COMPARISON:  Chest radiograph January 24, 2019 FINDINGS: The heart size and mediastinal contours are within normal limits. No focal consolidation. No pleural effusion. No pneumothorax. Degenerative changes spine. Partially visualized cervical fusion hardware. IMPRESSION: No acute cardiopulmonary disease. Electronically Signed   By: Dahlia Bailiff MD   On: 04/12/2020 15:08    Allergies  Allergen Reactions  . Clindamycin/Lincomycin Rash    Diffuse drug reaction eruption  . Other Hives, Rash and Itching  . Lamotrigine Rash    Past Medical History:  Diagnosis Date  . Anxiety   . Bipolar disorder ([place])   . Breast mass    right  . Cervical dysplasia   . Diabetes mellitus without complication ([place])   . Endometriosis   . GERD (gastroesophageal reflux disease)   . Hypercholesteremia   . Hypertension   . Migraine   . Pelvic kidney    left  . Postpartum depression    hx of   Social History   Socioeconomic History  . Marital status: Married    Spouse name: Not on file  . Number of children: Not on file  . Years of education: Not on file  . Highest education level: Not on file  Occupational History  . Not on file  Tobacco Use  . Smoking status: Never Smoker  . Smokeless tobacco: Never Used  Vaping Use  . Vaping Use: Never used  Substance and Sexual Activity  . Alcohol use: No  . Drug use: No  . Sexual  activity: Yes    Partners: Male    Birth control/protection: None    Comment: vasectomy  Other Topics Concern  . Not on file  Social History Narrative  . Not on file   Social Determinants of Health   Financial Resource Strain: Not on file  Food Insecurity: Not on file  Transportation Needs: Not on file  Physical Activity: Not on file  Stress: Not on file  Social Connections: Not on file  Intimate Partner Violence: Not on file   Family History  Problem Relation Age of Onset  . Diabetes Mother   . Breast  cancer Mother 65  . Thyroid disease Mother   . Depression Mother   . Hypertension Father   . Heart disease Father   . Heart attack Father 3       Two instances, first at age 20  . Stroke Father   . Alcohol abuse Father   . Throat cancer Father   . Diabetes Maternal Grandmother   . Depression Maternal Grandmother   . Uterine cancer Maternal Grandmother    Past Surgical History:  Procedure Laterality Date  . ABDOMINAL HYSTERECTOMY N/A    Phreesia 07/30/2019  . BACK SURGERY  2014  . BREAST LUMPECTOMY WITH RADIOACTIVE SEED LOCALIZATION Right 10/28/2014   Procedure: RIGHT BREAST LUMPECTOMY WITH RADIOACTIVE SEED LOCALIZATION;  Surgeon: [name], MD;  Location: [place];  Service: General;  Laterality: Right;  . BREAST LUMPECTOMY WITH RADIOACTIVE SEED LOCALIZATION Right 06/18/2019   Procedure: RIGHT BREAST RADIOACTIVE SEED LOCALIZATION LUMPECTOMY;  Surgeon: Jovita Kussmaul, MD;  Location: [place];  Service: General;  Laterality: Right;  . BREAST SURGERY N/A    Phreesia 07/30/2019  . CESAREAN SECTION  03  . COLONOSCOPY WITH PROPOFOL N/A 05/01/2018   Procedure: COLONOSCOPY WITH PROPOFOL;  Surgeon: [name], MD;  Location: [facility] ENDOSCOPY;  Service: Gastroenterology;  Laterality: N/A;  . COLPOSCOPY    . ESOPHAGOGASTRODUODENOSCOPY (EGD) WITH PROPOFOL N/A 05/01/2018   Procedure: ESOPHAGOGASTRODUODENOSCOPY (EGD) WITH PROPOFOL;  Surgeon: [name], MD;  Location: [facility] ENDOSCOPY;  Service: Gastroenterology;  Laterality: N/A;  . ESOPHAGOGASTRODUODENOSCOPY (EGD) WITH PROPOFOL N/A 11/13/2018   Procedure: ESOPHAGOGASTRODUODENOSCOPY (EGD) WITH PROPOFOL;  Surgeon: [name], MD;  Location: [facility] ENDOSCOPY;  Service: Gastroenterology;  Laterality: N/A;  . GYNECOLOGIC CRYOSURGERY    . NECK SURGERY  2012  . PELVIC LAPAROSCOPY  89     Vanessa Kick, MD 04/14/20 [phone]

## 2020-04-22 ENCOUNTER — Ambulatory Visit: Payer: Federal, State, Local not specified - PPO | Admitting: Physician Assistant

## 2020-04-22 ENCOUNTER — Telehealth: Payer: Self-pay | Admitting: Physician Assistant

## 2020-04-22 NOTE — Telephone Encounter (Signed)
Pt called to schedule apt for fatigue, headache and dizziness and lump on shoulder. Pt has not had a negative covid test. Per Herb Grays, patient will need neg covid test before coming into the office. Patient verbalized understanding.   Pt BP is 109/74. Patient advised that she was going to [facility] for evaluation and treatment. AS, CMA

## 2020-04-24 ENCOUNTER — Telehealth: Payer: Self-pay | Admitting: Physician Assistant

## 2020-04-24 NOTE — Telephone Encounter (Signed)
Patient has a negative COVID test result. Patient is having some swelling on her collar bone and is feeling weak and tired. Patient woke up this morning with a sore throat. Thanks.

## 2020-04-24 NOTE — Telephone Encounter (Signed)
Patient requested in office apt. Scheduled for next Tuesday. AS, CMA

## 2020-04-29 ENCOUNTER — Other Ambulatory Visit: Payer: Self-pay

## 2020-04-29 ENCOUNTER — Encounter: Payer: Self-pay | Admitting: Nurse Practitioner

## 2020-04-29 ENCOUNTER — Ambulatory Visit: Payer: Federal, State, Local not specified - PPO | Admitting: Nurse Practitioner

## 2020-04-29 VITALS — BP 83/55 | HR 98 | Temp 98.2°F | Ht 61.0 in | Wt 190.5 lb

## 2020-04-29 DIAGNOSIS — R5383 Other fatigue: Secondary | ICD-10-CM

## 2020-04-29 DIAGNOSIS — R229 Localized swelling, mass and lump, unspecified: Secondary | ICD-10-CM | POA: Diagnosis not present

## 2020-04-29 DIAGNOSIS — R0602 Shortness of breath: Secondary | ICD-10-CM

## 2020-04-29 NOTE — Progress Notes (Addendum)
Acute Office Visit  Subjective:    Patient ID: Morgan [name], female    DOB: March 11, 1965, 55 y.o.   MRN: [id number]  Chief Complaint  Patient presents with   Acute Visit   Shortness of Breath   Fatigue   Mass    Swelling of left side of neck.     HPI Patient is in today for evaluation of shortness of breath and fatigue. The symptoms have all been present for about a month. She was seen in Urgent care on 04/14/2020 for this e symptoms. A chest x-ray was done and was negative for any acute cardiopulmonary problems. The patient states, that in addition to the shortness of breath and fatigue, she has had a few episodes of dizziness. She states that all symptoms come and go. She states that she had a mild sore throat early on in this process, but this has completely resolved. She denies cough or congestion. She denies headache, fever, chills, or body aches. She denies nausea or vomiting. She did take a home COVID test on 04/24/2020 and it was negative. She denies chest pain, chest pressure, or palpitations.  The patient states that she has noted an area of swelling at the base of the left side of her neck. This area is soft and non-tender. She is concerned that this may indicate lymph node enlargement. She has history of ovarian cancer so this swelling makes her very nervous. She states that the swelling of the neck started around the same time as the previously mentioned symptoms.   Past Medical History:  Diagnosis Date   Anxiety    Bipolar disorder ([place])    Breast mass    right   Cervical dysplasia    Diabetes mellitus without complication ([place])    Endometriosis    GERD (gastroesophageal reflux disease)    Hypercholesteremia    Hypertension    Migraine    Pelvic kidney    left   Postpartum depression    hx of    Past Surgical History:  Procedure Laterality Date   ABDOMINAL HYSTERECTOMY N/A    [employer] 07/30/2019   BACK SURGERY  2014   BREAST LUMPECTOMY WITH RADIOACTIVE SEED  LOCALIZATION Right 10/28/2014   Procedure: RIGHT BREAST LUMPECTOMY WITH RADIOACTIVE SEED LOCALIZATION;  Surgeon: [name], MD;  Location: [place];  Service: General;  Laterality: Right;   BREAST LUMPECTOMY WITH RADIOACTIVE SEED LOCALIZATION Right 06/18/2019   Procedure: RIGHT BREAST RADIOACTIVE SEED LOCALIZATION LUMPECTOMY;  Surgeon: Jovita Kussmaul, MD;  Location: [place];  Service: General;  Laterality: Right;   BREAST SURGERY N/A    [employer]rees[employer] 07/30/2019   CESAREAN SECTION  03   COLONOSCOPY WITH PROPOFOL N/A 05/01/2018   Procedure: COLONOSCOPY WITH PROPOFOL;  Surgeon: [name], MD;  Location: [facility] ENDOSCOPY;  Service: Gastroenterology;  Laterality: N/A;   COLPOSCOPY     ESOPHAGOGASTRODUODENOSCOPY (EGD) WITH PROPOFOL N/A 05/01/2018   Procedure: ESOPHAGOGASTRODUODENOSCOPY (EGD) WITH PROPOFOL;  Surgeon: [name], MD;  Location: [facility] ENDOSCOPY;  Service: Gastroenterology;  Laterality: N/A;   ESOPHAGOGASTRODUODENOSCOPY (EGD) WITH PROPOFOL N/A 11/13/2018   Procedure: ESOPHAGOGASTRODUODENOSCOPY (EGD) WITH PROPOFOL;  Surgeon: [name], MD;  Location: [facility] ENDOSCOPY;  Service: Gastroenterology;  Laterality: N/A;   GYNECOLOGIC CRYOSURGERY     NECK SURGERY  2012   PELVIC LAPAROSCOPY  89    Family History  Problem Relation Age of Onset   Diabetes Mother    Breast cancer Mother 65   Thyroid disease Mother  Depression Mother    Hypertension Father    Heart disease Father    Heart attack Father 30       Two instances, first at age 26   Stroke Father    Alcohol abuse Father    Throat cancer Father    Diabetes Maternal Grandmother    Depression Maternal Grandmother    Uterine cancer Maternal Grandmother     Social History   Socioeconomic History   Marital status: Married    Spouse name: Not on file   Number of children: Not on file   Years of education: Not on file   Highest education level: Not on file  Occupational History   Not on file   Tobacco Use   Smoking status: Never Smoker   Smokeless tobacco: Never Used  Vaping Use   Vaping Use: Never used  Substance and Sexual Activity   Alcohol use: No   Drug use: No   Sexual activity: Yes    Partners: Male    Birth control/protection: None    Comment: vasectomy  Other Topics Concern   Not on file  Social History Narrative   Not on file   Social Determinants of Health   Financial Resource Strain: Not on file  Food Insecurity: Not on file  Transportation Needs: Not on file  Physical Activity: Not on file  Stress: Not on file  Social Connections: Not on file  Intimate Partner Violence: Not on file    Outpatient Medications Prior to Visit  Medication Sig Dispense Refill   ARIPiprazole (ABILIFY) 15 MG tablet Take 1 tablet by mouth at bedtime.     atorvastatin (LIPITOR) 40 MG tablet Take 1 tablet (40 mg total) by mouth daily. 90 tablet 0   butalbital-acetaminophen-caffeine (FIORICET) 50-325-40 MG tablet Take 1-2 tablets by mouth every 6 (six) hours as needed for headache. 20 tablet 0   clonazePAM (KLONOPIN) 1 MG tablet Take 0.5 mg by mouth daily as needed for anxiety.      cyclobenzaprine (FLEXERIL) 10 MG tablet Take 10 mg by mouth 3 (three) times daily as needed for muscle spasms.     DULoxetine (CYMBALTA) 60 MG capsule Take 60 mg by mouth at bedtime.  5   HYDROcodone-acetaminophen (NORCO/VICODIN) 5-325 MG tablet Take 1-2 tablets by mouth every 6 (six) hours as needed for moderate pain or severe pain. 10 tablet 0   losartan (COZAAR) 100 MG tablet Take 1 tablet daily by mouth. 90 tablet 1   meloxicam (MOBIC) 15 MG tablet TAKE 1 TABLET BY MOUTH ONCE A DAY AS DIRECTED FOR 10 TO 14 DAYS, THEN AS NEEDED     omeprazole (PRILOSEC) 20 MG capsule Take 20 mg by mouth at bedtime.      XIGDUO XR 10-500 MG TB24 TAKE 1 TABLET BY MOUTH EVERY DAY 90 tablet 0   insulin aspart (NOVOLOG) 100 UNIT/ML injection Inject 10 Units into the skin 3 (three) times daily before meals. 10 mL 2    No facility-administered medications prior to visit.    Allergies  Allergen Reactions   Clindamycin/Lincomycin Rash    Diffuse drug reaction eruption   Other Hives, Rash and Itching   Lamotrigine Rash    Review of Systems  Constitutional: Positive for fatigue. Negative for chills and fever.  HENT: Negative for congestion and sinus pain.        States that she had sore throat for a few days early on but this has resolved   Respiratory: Positive for shortness of  breath. Negative for cough and wheezing.        States that shortness of breath is intermittent.  Cardiovascular: Negative for chest pain and palpitations.  Gastrointestinal: Negative for constipation, nausea and vomiting.  Musculoskeletal: Negative for back pain and myalgias.  Skin: Negative for rash.       Area of swelling at the base of the left side of her neck. This is not tender.  Neurological: Negative for dizziness, weakness and headaches.  Hematological: Positive for adenopathy.  Psychiatric/Behavioral: Negative for dysphoric mood. The patient is not nervous/anxious.   All other systems reviewed and are negative.      Objective:    Physical Exam Vitals and nursing note reviewed.  Constitutional:      Appearance: Normal appearance. She is well-developed. She is ill-appearing.  HENT:     Head: Normocephalic and atraumatic.     Right Ear: Ear canal and external ear normal.     Left Ear: Ear canal and external ear normal.     Nose: No congestion.     Right Sinus: No maxillary sinus tenderness or frontal sinus tenderness.     Left Sinus: No maxillary sinus tenderness or frontal sinus tenderness.     Mouth/Throat:     Lips: Pink.     Mouth: Mucous membranes are moist.     Pharynx: Oropharynx is clear. Uvula midline.  Eyes:     Pupils: Pupils are equal, round, and reactive to light.  Neck:   Cardiovascular:     Rate and Rhythm: Normal rate and regular rhythm.     Heart sounds: Normal heart sounds.   Pulmonary:     Effort: Pulmonary effort is normal.     Breath sounds: Normal breath sounds.  Abdominal:     Palpations: Abdomen is soft.  Musculoskeletal:        General: Normal range of motion.     Cervical back: Normal range of motion and neck supple.  Skin:    General: Skin is warm and dry.     Capillary Refill: Capillary refill takes less than 2 seconds.  Neurological:     General: No focal deficit present.     Mental Status: She is alert and oriented to person, place, and time.  Psychiatric:        Mood and Affect: Mood normal.        Behavior: Behavior normal.        Thought Content: Thought content normal.        Judgment: Judgment normal.    Today's Vitals   04/29/20 0824  BP: (!) 83/55  Pulse: 98  Temp: 98.2 F (36.8 C)  SpO2: 98%  Weight: 190 lb 8 oz (86.4 kg)  Height: 5\' 1"  (1.549 m)   Body mass index is 35.99 kg/m.    Wt Readings from Last 3 Encounters:  04/29/20 190 lb 8 oz (86.4 kg)  03/24/20 187 lb 9.6 oz (85.1 kg)  01/15/20 180 lb (81.6 kg)    Health Maintenance Due  Topic Date Due   COVID-19 Vaccine (1) Never done   OPHTHALMOLOGY EXAM  03/04/2019    There are no preventive care reminders to display for this patient.   Lab Results  Component Value Date   TSH 1.640 03/24/2020   Lab Results  Component Value Date   WBC 5.6 03/24/2020   HGB 12.7 03/24/2020   HCT 39.2 03/24/2020   MCV 76 (L) 03/24/2020   PLT 232 03/24/2020   Lab Results  Component  Value Date   NA 140 03/24/2020   K 4.0 03/24/2020   CO2 24 03/24/2020   GLUCOSE 121 (H) 03/24/2020   BUN 7 03/24/2020   CREATININE 0.66 03/24/2020   BILITOT 0.5 03/24/2020   ALKPHOS 142 (H) 03/24/2020   AST 26 03/24/2020   ALT 27 03/24/2020   PROT 6.4 03/24/2020   ALBUMIN 4.1 03/24/2020   CALCIUM 9.5 03/24/2020   ANIONGAP 9 01/15/2020   Lab Results  Component Value Date   CHOL 138 03/24/2020   Lab Results  Component Value Date   HDL 40 03/24/2020   Lab Results  Component  Value Date   LDLCALC 73 03/24/2020   Lab Results  Component Value Date   TRIG 140 03/24/2020   Lab Results  Component Value Date   CHOLHDL 3.5 03/24/2020   Lab Results  Component Value Date   HGBA1C 6.9 (H) 03/24/2020       Assessment & Plan:  1. Local superficial swelling, mass or lump This could be lymphadenopathy or fluid filled cyst. Will get ultrasound of the area for further evaluation.  - US Soft Tissue Head/Neck (NON-THYROID); Future  2. Shortness of breath Breath sounds clear. Recent chest x-ray without acute abnormality. Check labs and will treat for infection if indicated.   3. Fatigue, unspecified type Check CBC and BMP for further evaluation. - CBC with Differential/Platelet - Basic Metabolic Panel (BMET)   Problem List Items Addressed This Visit       Other   Local superficial swelling, mass or lump - Primary   Relevant Orders   US Soft Tissue Head/Neck (NON-THYROID)   Shortness of breath   Fatigue   Relevant Orders   CBC with Differential/Platelet   Basic Metabolic Panel (BMET)       Time spent with the patient was approximately 30 minutes. This time included reviewing progress notes, labs, imaging studies, and discussing plan for follow up.    Ronnell Freshwater, NP  Hypotension - consider  cut losartan in half and recheck at home or in office. Could be contributing to fatigue  SOB - consider evaluate for PE, etc.

## 2020-04-29 NOTE — Patient Instructions (Signed)
Lymphadenopathy  Lymphadenopathy means that your lymph glands are swollen or larger than normal. Lymph glands, also called lymph nodes, are collections of tissue that filter excess fluid, bacteria, viruses, and waste from your bloodstream. They are part of your body's disease-fighting system (immune system), which protects your body from germs. There may be different causes of lymphadenopathy, depending on where it is in your body. Some types go away on their own. Lymphadenopathy can occur anywhere that you have lymph glands, including these areas:  Neck (cervical lymphadenopathy).  Chest (mediastinal lymphadenopathy).  Lungs (hilar lymphadenopathy).  Underarms (axillary lymphadenopathy).  Groin (inguinal lymphadenopathy). When your immune system responds to germs, infection-fighting cells and fluid build up in your lymph glands. This causes some swelling and enlargement. If the lymph nodes do not go back to normal size after you have an infection or disease, your health care provider may do tests. These tests help to monitor your condition and find the reason why the glands are still swollen and enlarged. Follow these instructions at home:  Get plenty of rest.  Your health care provider may recommend over-the-counter medicines for pain. Take over-the-counter and prescription medicines only as told by your health care provider.  If directed, apply heat to swollen lymph glands as often as told by your health care provider. Use the heat source that your health care provider recommends, such as a moist heat pack or a heating pad. ? Place a towel between your skin and the heat source. ? Leave the heat on for 20-30 minutes. ? Remove the heat if your skin turns bright red. This is especially important if you are unable to feel pain, heat, or cold. You may have a greater risk of getting burned.  Check your affected lymph glands every day for changes. Check other lymph gland areas as told by your  health care provider. Check for changes such as: ? More swelling. ? Sudden increase in size. ? Redness or pain. ? Hardness.  Keep all follow-up visits. This is important.   Contact a health care provider if you have:  Lymph glands that: ? Are still swollen after 2 weeks. ? Have suddenly gotten bigger or the swelling spreads. ? Are red, painful, or hard.  Fluid leaking from the skin near an enlarged lymph gland.  Problems with breathing.  A fever, chills, or night sweats.  Fatigue.  A sore throat.  Pain in your abdomen.  Weight loss. Get help right away if you have:  Severe pain.  Chest pain.  Shortness of breath. These symptoms may represent a serious problem that is an emergency. Do not wait to see if the symptoms will go away. Get medical help right away. Call your local emergency services (911 in the [place] Do not drive yourself to the hospital. Summary  Lymphadenopathy means that your lymph glands are swollen or larger than normal.  Lymph glands, also called lymph nodes, are collections of tissue that filter excess fluid, bacteria, viruses, and waste from the bloodstream. They are part of your body's disease-fighting system (immune system).  Lymphadenopathy can occur anywhere that you have lymph glands.  If the lymph nodes do not go back to normal size after you have an infection or disease, your health care provider may do tests to monitor your condition and find the reason why the glands are still swollen and enlarged.  Check your affected lymph glands every day for changes. Check other lymph gland areas as told by your health care provider. This information   is not intended to replace advice given to you by your health care provider. Make sure you discuss any questions you have with your health care provider. Document Revised: 11/28/2019 Document Reviewed: 11/28/2019 Elsevier Patient Education  2021 [employer]  

## 2020-04-30 LAB — CBC WITH DIFFERENTIAL/PLATELET
Basophils Absolute: 0.1 10*3/uL (ref 0.0–0.2)
Basos: 1 %
EOS (ABSOLUTE): 0.3 10*3/uL (ref 0.0–0.4)
Eos: 5 %
Hematocrit: 39.7 % (ref 34.0–46.6)
Hemoglobin: 12.9 g/dL (ref 11.1–15.9)
Immature Grans (Abs): 0 10*3/uL (ref 0.0–0.1)
Immature Granulocytes: 0 %
Lymphocytes Absolute: 2.4 10*3/uL (ref 0.7–3.1)
Lymphs: 33 %
MCH: 25.2 pg — ABNORMAL LOW (ref 26.6–33.0)
MCHC: 32.5 g/dL (ref 31.5–35.7)
MCV: 78 fL — ABNORMAL LOW (ref 79–97)
Monocytes Absolute: 0.3 10*3/uL (ref 0.1–0.9)
Monocytes: 4 %
Neutrophils Absolute: 4.1 10*3/uL (ref 1.4–7.0)
Neutrophils: 57 %
Platelets: 260 10*3/uL (ref 150–450)
RBC: 5.12 x10E6/uL (ref 3.77–5.28)
RDW: 15.6 % — ABNORMAL HIGH (ref 11.7–15.4)
WBC: 7.2 10*3/uL (ref 3.4–10.8)

## 2020-04-30 NOTE — Progress Notes (Signed)
Waiting on all results

## 2020-05-07 ENCOUNTER — Ambulatory Visit
Admission: RE | Admit: 2020-05-07 | Discharge: 2020-05-07 | Disposition: A | Discharge status: Home / Self Care | Payer: Federal, State, Local not specified - PPO | Source: Ambulatory Visit | Attending: Nurse Practitioner | Admitting: Nurse Practitioner

## 2020-05-07 DIAGNOSIS — R229 Localized swelling, mass and lump, unspecified: Secondary | ICD-10-CM

## 2020-05-14 ENCOUNTER — Ambulatory Visit: Payer: Federal, State, Local not specified - PPO | Admitting: Nurse Practitioner

## 2020-05-14 ENCOUNTER — Other Ambulatory Visit: Payer: Self-pay

## 2020-05-14 ENCOUNTER — Encounter: Payer: Self-pay | Admitting: Nurse Practitioner

## 2020-05-14 VITALS — BP 95/65 | HR 98 | Temp 96.0°F | Ht 61.0 in | Wt 189.9 lb

## 2020-05-14 DIAGNOSIS — R229 Localized swelling, mass and lump, unspecified: Secondary | ICD-10-CM | POA: Diagnosis not present

## 2020-05-14 DIAGNOSIS — D7289 Other specified disorders of white blood cells: Secondary | ICD-10-CM | POA: Insufficient documentation

## 2020-05-14 DIAGNOSIS — I959 Hypotension, unspecified: Secondary | ICD-10-CM

## 2020-05-14 MED ORDER — AMOXICILLIN 500 MG PO TABS: 500.0000 mg | ORAL_TABLET | Freq: Two times a day (BID) | ORAL | 0 refills | Status: DC

## 2020-05-14 MED ORDER — LOSARTAN POTASSIUM 100 MG PO TABS: ORAL_TABLET | ORAL | 1 refills | Status: DC

## 2020-05-14 NOTE — Progress Notes (Signed)
Established Patient Office Visit  Subjective:  Patient ID: Morgan [name], female    DOB: 1966-02-04  Age: 55 y.o. MRN: [id number]  CC:  Chief Complaint  Patient presents with  . Follow-up    HPI Morgan [name] presents for follow up visit. She did have an ultrasound of swelling at the base of the left side of her neck. There were no remarkable abnormalities noted. Swelling still present, however, it is improved. Her CBC was essentially normal, though lymph counts were slightly elevated over previous checks. She states that she continues to have moderate fatigue. She also has episodes of dizziness which are worse when she goes from lying to sitting or sitting to standing positions. Once she gets moving, the dizziness subsides. Of note, her blood pressure was low at 83/65. It is improved today at 95/65. She is currently taking losartan 100mg  daily to control blood pressure. She denies chest pain, chest pressure, or persistent shortness of breath. She denies headaches. She denies nausea, vomiting, or abdominal pain.   Past Medical History:  Diagnosis Date  . Anxiety   . Bipolar disorder ([place])   . Breast mass    right  . Cervical dysplasia   . Diabetes mellitus without complication ([place])   . Endometriosis   . GERD (gastroesophageal reflux disease)   . Hypercholesteremia   . Hypertension   . Migraine   . Pelvic kidney    left  . Postpartum depression    hx of    Past Surgical History:  Procedure Laterality Date  . ABDOMINAL HYSTERECTOMY N/A    Phreesia 07/30/2019  . BACK SURGERY  2014  . BREAST LUMPECTOMY WITH RADIOACTIVE SEED LOCALIZATION Right 10/28/2014   Procedure: RIGHT BREAST LUMPECTOMY WITH RADIOACTIVE SEED LOCALIZATION;  Surgeon: [name], MD;  Location: [place];  Service: General;  Laterality: Right;  . BREAST LUMPECTOMY WITH RADIOACTIVE SEED LOCALIZATION Right 06/18/2019   Procedure: RIGHT BREAST RADIOACTIVE SEED LOCALIZATION LUMPECTOMY;   Surgeon: Jovita Kussmaul, MD;  Location: [place];  Service: General;  Laterality: Right;  . BREAST SURGERY N/A    Phreesia 07/30/2019  . CESAREAN SECTION  03  . COLONOSCOPY WITH PROPOFOL N/A 05/01/2018   Procedure: COLONOSCOPY WITH PROPOFOL;  Surgeon: [name], MD;  Location: [facility] ENDOSCOPY;  Service: Gastroenterology;  Laterality: N/A;  . COLPOSCOPY    . ESOPHAGOGASTRODUODENOSCOPY (EGD) WITH PROPOFOL N/A 05/01/2018   Procedure: ESOPHAGOGASTRODUODENOSCOPY (EGD) WITH PROPOFOL;  Surgeon: [name], MD;  Location: [facility] ENDOSCOPY;  Service: Gastroenterology;  Laterality: N/A;  . ESOPHAGOGASTRODUODENOSCOPY (EGD) WITH PROPOFOL N/A 11/13/2018   Procedure: ESOPHAGOGASTRODUODENOSCOPY (EGD) WITH PROPOFOL;  Surgeon: [name], MD;  Location: [facility] ENDOSCOPY;  Service: Gastroenterology;  Laterality: N/A;  . GYNECOLOGIC CRYOSURGERY    . NECK SURGERY  2012  . PELVIC LAPAROSCOPY  47    Family History  Problem Relation Age of Onset  . Diabetes Mother   . Breast cancer Mother 29  . Thyroid disease Mother   . Depression Mother   . Hypertension Father   . Heart disease Father   . Heart attack Father 90       Two instances, first at age 47  . Stroke Father   . Alcohol abuse Father   . Throat cancer Father   . Diabetes Maternal Grandmother   . Depression Maternal Grandmother   . Uterine cancer Maternal Grandmother     Social History   Socioeconomic History  . Marital status: Married  Spouse name: Not on file  . Number of children: Not on file  . Years of education: Not on file  . Highest education level: Not on file  Occupational History  . Not on file  Tobacco Use  . Smoking status: Never Smoker  . Smokeless tobacco: Never Used  Vaping Use  . Vaping Use: Never used  Substance and Sexual Activity  . Alcohol use: No  . Drug use: No  . Sexual activity: Yes    Partners: Male    Birth control/protection: None    Comment: vasectomy  Other Topics Concern  . Not on  file  Social History Narrative  . Not on file   Social Determinants of Health   Financial Resource Strain: Not on file  Food Insecurity: Not on file  Transportation Needs: Not on file  Physical Activity: Not on file  Stress: Not on file  Social Connections: Not on file  Intimate Partner Violence: Not on file    Outpatient Medications Prior to Visit  Medication Sig Dispense Refill  . ARIPiprazole (ABILIFY) 15 MG tablet Take 1 tablet by mouth at bedtime.    Marland Kitchen atorvastatin (LIPITOR) 40 MG tablet Take 1 tablet (40 mg total) by mouth daily. 90 tablet 0  . butalbital-acetaminophen-caffeine (FIORICET) 50-325-40 MG tablet Take 1-2 tablets by mouth every 6 (six) hours as needed for headache. 20 tablet 0  . clonazePAM (KLONOPIN) 1 MG tablet Take 0.5 mg by mouth daily as needed for anxiety.     . cyclobenzaprine (FLEXERIL) 10 MG tablet Take 10 mg by mouth 3 (three) times daily as needed for muscle spasms.    . DULoxetine (CYMBALTA) 60 MG capsule Take 60 mg by mouth at bedtime.  5  . HYDROcodone-acetaminophen (NORCO/VICODIN) 5-325 MG tablet Take 1-2 tablets by mouth every 6 (six) hours as needed for moderate pain or severe pain. 10 tablet 0  . meloxicam (MOBIC) 15 MG tablet TAKE 1 TABLET BY MOUTH ONCE A DAY AS DIRECTED FOR 10 TO 14 DAYS, THEN AS NEEDED    . omeprazole (PRILOSEC) 20 MG capsule Take 20 mg by mouth at bedtime.     Marland Kitchen XIGDUO XR 10-500 MG TB24 TAKE 1 TABLET BY MOUTH EVERY DAY 90 tablet 0  . losartan (COZAAR) 100 MG tablet Take 1 tablet daily by mouth. 90 tablet 1  . insulin aspart (NOVOLOG) 100 UNIT/ML injection Inject 10 Units into the skin 3 (three) times daily before meals. 10 mL 2   No facility-administered medications prior to visit.    Allergies  Allergen Reactions  . Clindamycin/Lincomycin Rash    Diffuse drug reaction eruption  . Other Hives, Rash and Itching  . Lamotrigine Rash    ROS Review of Systems  Constitutional: Positive for fatigue. Negative for chills and  fever.  HENT: Negative for congestion, postnasal drip, rhinorrhea and sinus pain.   Eyes: Negative.   Respiratory: Negative for cough, shortness of breath and wheezing.   Cardiovascular: Negative for chest pain and palpitations.       Low blood pressure   Gastrointestinal: Negative for constipation and nausea.  Endocrine: Negative.   Musculoskeletal: Negative for back pain and myalgias.  Skin: Negative for rash.  Neurological: Positive for dizziness. Negative for weakness and headaches.  Hematological: Positive for adenopathy.  Psychiatric/Behavioral: The patient is not nervous/anxious.   All other systems reviewed and are negative.     Objective:    Physical Exam Vitals and nursing note reviewed.  Constitutional:  Appearance: Normal appearance. She is well-developed.  HENT:     Head: Normocephalic and atraumatic.     Nose: Nose normal.  Eyes:     Pupils: Pupils are equal, round, and reactive to light.  Neck:   Cardiovascular:     Rate and Rhythm: Normal rate and regular rhythm.     Pulses: Normal pulses.     Heart sounds: Normal heart sounds.  Pulmonary:     Effort: Pulmonary effort is normal.     Breath sounds: Normal breath sounds.  Abdominal:     Palpations: Abdomen is soft.  Musculoskeletal:        General: Normal range of motion.     Cervical back: Normal range of motion and neck supple.  Skin:    General: Skin is warm and dry.     Capillary Refill: Capillary refill takes less than 2 seconds.  Neurological:     General: No focal deficit present.     Mental Status: She is alert and oriented to person, place, and time.  Psychiatric:        Mood and Affect: Mood normal.        Behavior: Behavior normal.        Thought Content: Thought content normal.        Judgment: Judgment normal.     Today's Vitals   05/14/20 0928  BP: 95/65  Pulse: 98  Temp: (!) 96 F (35.6 C)  SpO2: 95%  Weight: 189 lb 14.4 oz (86.1 kg)  Height: 5\' 1"  (1.549 m)   Body  mass index is 35.88 kg/m.   Wt Readings from Last 3 Encounters:  05/14/20 189 lb 14.4 oz (86.1 kg)  04/29/20 190 lb 8 oz (86.4 kg)  03/24/20 187 lb 9.6 oz (85.1 kg)     Health Maintenance Due  Topic Date Due  . OPHTHALMOLOGY EXAM  03/04/2019    There are no preventive care reminders to display for this patient.  Lab Results  Component Value Date   TSH 1.640 03/24/2020   Lab Results  Component Value Date   WBC 7.2 04/29/2020   HGB 12.9 04/29/2020   HCT 39.7 04/29/2020   MCV 78 (L) 04/29/2020   PLT 260 04/29/2020   Lab Results  Component Value Date   NA 140 03/24/2020   K 4.0 03/24/2020   CO2 24 03/24/2020   GLUCOSE 121 (H) 03/24/2020   BUN 7 03/24/2020   CREATININE 0.66 03/24/2020   BILITOT 0.5 03/24/2020   ALKPHOS 142 (H) 03/24/2020   AST 26 03/24/2020   ALT 27 03/24/2020   PROT 6.4 03/24/2020   ALBUMIN 4.1 03/24/2020   CALCIUM 9.5 03/24/2020   ANIONGAP 9 01/15/2020   Lab Results  Component Value Date   CHOL 138 03/24/2020   Lab Results  Component Value Date   HDL 40 03/24/2020   Lab Results  Component Value Date   LDLCALC 73 03/24/2020   Lab Results  Component Value Date   TRIG 140 03/24/2020   Lab Results  Component Value Date   CHOLHDL 3.5 03/24/2020   Lab Results  Component Value Date   HGBA1C 6.9 (H) 03/24/2020      Assessment & Plan:  1. Local superficial swelling, mass or lump Reviewed results of soft tissue ultrasound with the patient. No remarkable abnormalities noted. Will monitor.   2. Left-shifted white blood cells Lymph counts slightly elevated from last check, though values witnin normal limits. Start amoxicillin 500mg  twice daily 7 days, monitor  closely.  - amoxicillin (AMOXIL) 500 MG tablet; Take 1 tablet (500 mg total) by mouth 2 (two) times daily.  Dispense: 14 tablet; Refill: 0  3. Hypotension, unspecified hypotension type Decrease dosing of losartan to 50mg  daily. Continue to follow dietary and lifestyle changes to  keep healthy blood pressure. Advised her to monitor closely.  - losartan (COZAAR) 100 MG tablet; Take 1 tablet daily by mouth.  Dispense: 90 tablet; Refill: 1   Problem List Items Addressed This Visit      Cardiovascular and Mediastinum   Hypertension complicating diabetes ([place])   Relevant Medications   losartan (COZAAR) 100 MG tablet     Other   Local superficial swelling, mass or lump - Primary   Left-shifted white blood cells   Relevant Medications   amoxicillin (AMOXIL) 500 MG tablet      Meds ordered this encounter  Medications  . amoxicillin (AMOXIL) 500 MG tablet    Sig: Take 1 tablet (500 mg total) by mouth 2 (two) times daily.    Dispense:  14 tablet    Refill:  0    Order Specific Question:   Supervising Provider    Answer:   Beatrice Lecher D [[id number]]  . losartan (COZAAR) 100 MG tablet    Sig: Take 1 tablet daily by mouth.    Dispense:  90 tablet    Refill:  1    Patient to start taking 1/2 tablet daily - prescription update    Order Specific Question:   Supervising Provider    Answer:   Beatrice Lecher D [[id number]]   Time spent with the patient was approximately 25 minutes. This time included reviewing progress notes, labs, imaging studies, and discussing plan for follow up.   Follow-up: Return in about 3 weeks (around 06/04/2020) for bp - decreased losartan.    Morgan Freshwater, NP

## 2020-05-14 NOTE — Patient Instructions (Signed)

## 2020-06-04 ENCOUNTER — Other Ambulatory Visit: Payer: Self-pay

## 2020-06-04 ENCOUNTER — Ambulatory Visit: Payer: Federal, State, Local not specified - PPO | Admitting: Physician Assistant

## 2020-06-04 ENCOUNTER — Encounter: Payer: Self-pay | Admitting: Physician Assistant

## 2020-06-04 VITALS — BP 119/74 | HR 101 | Temp 97.8°F | Ht 61.0 in | Wt 191.7 lb

## 2020-06-04 DIAGNOSIS — R5382 Chronic fatigue, unspecified: Secondary | ICD-10-CM

## 2020-06-04 DIAGNOSIS — R42 Dizziness and giddiness: Secondary | ICD-10-CM | POA: Diagnosis not present

## 2020-06-04 DIAGNOSIS — R0602 Shortness of breath: Secondary | ICD-10-CM

## 2020-06-04 DIAGNOSIS — R0789 Other chest pain: Secondary | ICD-10-CM

## 2020-06-04 DIAGNOSIS — R229 Localized swelling, mass and lump, unspecified: Secondary | ICD-10-CM

## 2020-06-04 DIAGNOSIS — I959 Hypotension, unspecified: Secondary | ICD-10-CM | POA: Diagnosis not present

## 2020-06-04 MED ORDER — MECLIZINE HCL 12.5 MG PO TABS: 12.5000 mg | ORAL_TABLET | Freq: Two times a day (BID) | ORAL | 0 refills | Status: DC | PRN

## 2020-06-04 NOTE — Progress Notes (Signed)
Established Patient Office Visit  Subjective:  Patient ID: Morgan [name], female    DOB: [birth year]  Age: 55 y.o. MRN: [id number]  CC:  Chief Complaint  Patient presents with  . Hypertension    HPI Morgan [name] presents for follow up on hypertension. Patient was seen for acute visit by colleague a few weeks ago and BP was low so Losartan was decreased from 100 mg to 50 mg. Patient has been checking BP at home and readings have been 119/70s. Reports her ambulatory readings are usually >100 and occasionally less than that. Continues to have dizziness which occurs at random times and can happen sitting or standing up. Feels like the room is spinning. Also reports lower chest ache near sternum that occurs 2-3 times/wk and can last 30 minutes or more. States does have acid reflux and takes omeprazole, but this ache feels different. Sometimes gets a sensation that her heart is racing. Continues to have fatigue and shortness of breath with activity or at rest, feels like it is the same. Denies history of smoking. Denies headache or edema. Patient is still concerned about localized swelling of neck mostly on left side.  Past Medical History:  Diagnosis Date  . Anxiety   . Bipolar disorder ([place])   . Breast mass    right  . Cervical dysplasia   . Diabetes mellitus without complication ([place])   . Endometriosis   . GERD (gastroesophageal reflux disease)   . Hypercholesteremia   . Hypertension   . Migraine   . Pelvic kidney    left  . Postpartum depression    hx of    Past Surgical History:  Procedure Laterality Date  . ABDOMINAL HYSTERECTOMY N/A    Phreesia 07/30/2019  . BACK SURGERY  2014  . BREAST LUMPECTOMY WITH RADIOACTIVE SEED LOCALIZATION Right 10/28/2014   Procedure: RIGHT BREAST LUMPECTOMY WITH RADIOACTIVE SEED LOCALIZATION;  Surgeon: [name], MD;  Location: [place];  Service: General;  Laterality: Right;  . BREAST LUMPECTOMY WITH RADIOACTIVE SEED  LOCALIZATION Right 06/18/2019   Procedure: RIGHT BREAST RADIOACTIVE SEED LOCALIZATION LUMPECTOMY;  Surgeon: Jovita Kussmaul, MD;  Location: [place];  Service: General;  Laterality: Right;  . BREAST SURGERY N/A    Phreesia 07/30/2019  . CESAREAN SECTION  03  . COLONOSCOPY WITH PROPOFOL N/A 05/01/2018   Procedure: COLONOSCOPY WITH PROPOFOL;  Surgeon: [name], MD;  Location: [facility] ENDOSCOPY;  Service: Gastroenterology;  Laterality: N/A;  . COLPOSCOPY    . ESOPHAGOGASTRODUODENOSCOPY (EGD) WITH PROPOFOL N/A 05/01/2018   Procedure: ESOPHAGOGASTRODUODENOSCOPY (EGD) WITH PROPOFOL;  Surgeon: [name], MD;  Location: [facility] ENDOSCOPY;  Service: Gastroenterology;  Laterality: N/A;  . ESOPHAGOGASTRODUODENOSCOPY (EGD) WITH PROPOFOL N/A 11/13/2018   Procedure: ESOPHAGOGASTRODUODENOSCOPY (EGD) WITH PROPOFOL;  Surgeon: [name], MD;  Location: [facility] ENDOSCOPY;  Service: Gastroenterology;  Laterality: N/A;  . GYNECOLOGIC CRYOSURGERY    . NECK SURGERY  2012  . PELVIC LAPAROSCOPY  60    Family History  Problem Relation Age of Onset  . Diabetes Mother   . Breast cancer Mother 51  . Thyroid disease Mother   . Depression Mother   . Hypertension Father   . Heart disease Father   . Heart attack Father 1       Two instances, first at age 52  . Stroke Father   . Alcohol abuse Father   . Throat cancer Father   . Diabetes Maternal Grandmother   . Depression Maternal Grandmother   .  Uterine cancer Maternal Grandmother     Social History   Socioeconomic History  . Marital status: Married    Spouse name: Not on file  . Number of children: Not on file  . Years of education: Not on file  . Highest education level: Not on file  Occupational History  . Not on file  Tobacco Use  . Smoking status: Never Smoker  . Smokeless tobacco: Never Used  Vaping Use  . Vaping Use: Never used  Substance and Sexual Activity  . Alcohol use: No  . Drug use: No  . Sexual activity: Yes    Partners:  Male    Birth control/protection: None    Comment: vasectomy  Other Topics Concern  . Not on file  Social History Narrative  . Not on file   Social Determinants of Health   Financial Resource Strain: Not on file  Food Insecurity: Not on file  Transportation Needs: Not on file  Physical Activity: Not on file  Stress: Not on file  Social Connections: Not on file  Intimate Partner Violence: Not on file    Outpatient Medications Prior to Visit  Medication Sig Dispense Refill  . amoxicillin (AMOXIL) 500 MG tablet Take 1 tablet (500 mg total) by mouth 2 (two) times daily. 14 tablet 0  . ARIPiprazole (ABILIFY) 15 MG tablet Take 1 tablet by mouth at bedtime.    Marland Kitchen atorvastatin (LIPITOR) 40 MG tablet Take 1 tablet (40 mg total) by mouth daily. 90 tablet 0  . butalbital-acetaminophen-caffeine (FIORICET) 50-325-40 MG tablet Take 1-2 tablets by mouth every 6 (six) hours as needed for headache. 20 tablet 0  . clonazePAM (KLONOPIN) 1 MG tablet Take 0.5 mg by mouth daily as needed for anxiety.     . cyclobenzaprine (FLEXERIL) 10 MG tablet Take 10 mg by mouth 3 (three) times daily as needed for muscle spasms.    . DULoxetine (CYMBALTA) 60 MG capsule Take 60 mg by mouth at bedtime.  5  . HYDROcodone-acetaminophen (NORCO/VICODIN) 5-325 MG tablet Take 1-2 tablets by mouth every 6 (six) hours as needed for moderate pain or severe pain. 10 tablet 0  . losartan (COZAAR) 100 MG tablet Take 1 tablet daily by mouth. 90 tablet 1  . omeprazole (PRILOSEC) 20 MG capsule Take 20 mg by mouth at bedtime.     Marland Kitchen XIGDUO XR 10-500 MG TB24 TAKE 1 TABLET BY MOUTH EVERY DAY 90 tablet 0  . insulin aspart (NOVOLOG) 100 UNIT/ML injection Inject 10 Units into the skin 3 (three) times daily before meals. 10 mL 2  . meloxicam (MOBIC) 15 MG tablet TAKE 1 TABLET BY MOUTH ONCE A DAY AS DIRECTED FOR 10 TO 14 DAYS, THEN AS NEEDED     No facility-administered medications prior to visit.    Allergies  Allergen Reactions  .  Clindamycin/Lincomycin Rash    Diffuse drug reaction eruption  . Other Hives, Rash and Itching  . Lamotrigine Rash    ROS Review of Systems A fourteen system review of systems was performed and found to be positive as per HPI.   Objective:    Physical Exam General:  Well Developed, well nourished, appropriate for stated age.  Neuro:  Alert and oriented,  extra-ocular muscles intact, nystagmus noted  HEENT:  Normocephalic, atraumatic, neck supple, swelling of left cervical area when compared to the right, no adenopathy appreciated Skin:  no gross rash, warm, pink. Cardiac:  RRR, S1 S2 wnl's Respiratory:  ECTA B/L w/o wheezing, crackles or rales,  Not using accessory muscles, speaking in full sentences- unlabored. Vascular:  Ext warm, no cyanosis apprec.; cap RF less 2 sec. No edema Psych:  No HI/SI, judgement and insight good, Euthymic mood. Full Affect.   BP 119/74   Pulse (!) 101   Temp 97.8 F (36.6 C)   Ht '5\' 1"'  (1.549 m)   Wt 191 lb 11.2 oz (87 kg)   LMP 05/12/2011   SpO2 97%   BMI 36.22 kg/m  Wt Readings from Last 3 Encounters:  06/04/20 191 lb 11.2 oz (87 kg)  05/14/20 189 lb 14.4 oz (86.1 kg)  04/29/20 190 lb 8 oz (86.4 kg)     Health Maintenance Due  Topic Date Due  . COVID-19 Vaccine (1) Never done  . OPHTHALMOLOGY EXAM  03/04/2019    There are no preventive care reminders to display for this patient.  Lab Results  Component Value Date   TSH 1.640 03/24/2020   Lab Results  Component Value Date   WBC 7.2 04/29/2020   HGB 12.9 04/29/2020   HCT 39.7 04/29/2020   MCV 78 (L) 04/29/2020   PLT 260 04/29/2020   Lab Results  Component Value Date   NA 140 03/24/2020   K 4.0 03/24/2020   CO2 24 03/24/2020   GLUCOSE 121 (H) 03/24/2020   BUN 7 03/24/2020   CREATININE 0.66 03/24/2020   BILITOT 0.5 03/24/2020   ALKPHOS 142 (H) 03/24/2020   AST 26 03/24/2020   ALT 27 03/24/2020   PROT 6.4 03/24/2020   ALBUMIN 4.1 03/24/2020   CALCIUM 9.5 03/24/2020    ANIONGAP 9 01/15/2020   Lab Results  Component Value Date   CHOL 138 03/24/2020   Lab Results  Component Value Date   HDL 40 03/24/2020   Lab Results  Component Value Date   LDLCALC 73 03/24/2020   Lab Results  Component Value Date   TRIG 140 03/24/2020   Lab Results  Component Value Date   CHOLHDL 3.5 03/24/2020   Lab Results  Component Value Date   HGBA1C 6.9 (H) 03/24/2020      Assessment & Plan:   Problem List Items Addressed This Visit      Other   Local superficial swelling, mass or lump   Shortness of breath   Relevant Orders   CBC w/Diff   Comp Met (CMET)   B Nat Peptide   Sedimentation rate   C-reactive protein   TSH   Ambulatory referral to Cardiology   Fatigue   Relevant Orders   Sedimentation rate   C-reactive protein   Antinuclear Antib (ANA)   TSH    Other Visit Diagnoses    Hypotension, unspecified hypotension type    -  Primary   Vertigo       Relevant Medications   meclizine (ANTIVERT) 12.5 MG tablet   Atypical chest pain       Relevant Orders   Ambulatory referral to Cardiology     Hypotension, unspecified type: -Patient's blood pressure has remained stable and at goal of <135/85 so will continue with Losartan 50 mg. -Encourage to continue with good hydration and monitoring BP/pulse at home. -Will continue to monitor.  Atypical chest pain: -Patient had EKG performed by [facility] 04/12/2020, according to consult NSR. Chest XR revealed no acute cardiopulmonary disease. Patient complains of intermittent palpitations and tachycardia so will place referral to cardiology for further evaluation. -Recommend to seek immediate medical care if develops red flag signs/symptoms. -Patient on omeprazole so less likely GERD  related, however, medication possibly no longer effective and recommend to consider changing medication if cardiac work-up negative.   Shortness of breath, Fatigue: -Discussed with patient chest XR results and recommend further  evaluation with blood work- CBC w/ diff, CMP, TSH, BNP, CRP/ESR and ANA to evaluate for other possible etiologies such as metabolic, endocrine, cardiac or autoimmune. No adventitious sounds noted on exam and patient has completed antibiotic therapy with minimal improvement so less likely infectious. -Will place cardiology referral for further evaluation. If cardiac work up is negative, recommend pulmonology referral.  Vertigo: -Discussed with patient dizziness is likely related to vertigo. Recommend to take meclizine as needed for dizziness. If symptoms fail to improve or worsen recommend referral to PT for vestibular rehabilitation. Recommend to continue to stay well hydrated.  Localized superficial swelling, mass or lump: -Discussed with patient soft tissue US of neck/head revealed no adenopathy, soft tissue lesion, focal fluid. Recommend to monitor symptoms for now. If symptoms worsen or fail to improve recommend further evaluation with CT. Patient verbalized understanding.   Meds ordered this encounter  Medications  . meclizine (ANTIVERT) 12.5 MG tablet    Sig: Take 1 tablet (12.5 mg total) by mouth 2 (two) times daily as needed for dizziness.    Dispense:  30 tablet    Refill:  0    Order Specific Question:   Supervising Provider    Answer:   Beatrice Lecher D [[id number]]    Follow-up: Return in about 2 weeks (around 06/18/2020) for Vertigo, Swelling, HTN.    Lorrene Reid, PA-C

## 2020-06-04 NOTE — Patient Instructions (Signed)
Vertigo Vertigo is the feeling that you or your surroundings are moving when they are not. This feeling can come and go at any time. Vertigo often goes away on its own. Vertigo can be dangerous if it occurs while you are doing something that could endanger you or others, such as driving or operating machinery. Your health care provider will do tests to determine the cause of your vertigo. Tests will also help your health care provider decide how best to treat your condition. Follow these instructions at home: Eating and drinking  Drink enough fluid to keep your urine pale yellow.  Do not drink alcohol.      Activity  Return to your normal activities as told by your health care provider. Ask your health care provider what activities are safe for you.  In the morning, first sit up on the side of the bed. When you feel okay, stand slowly while you hold onto something until you know that your balance is fine.  Move slowly. Avoid sudden body or head movements or certain positions, as told by your health care provider.  If you have trouble walking or keeping your balance, try using a cane for stability. If you feel dizzy or unstable, sit down right away.  Avoid doing any tasks that would cause danger to you or others if vertigo occurs.  Avoid bending down if you feel dizzy. Place items in your home so that they are easy for you to reach without leaning over.  Do not drive or use heavy machinery if you feel dizzy. General instructions  Take over-the-counter and prescription medicines only as told by your health care provider.  Keep all follow-up visits as told by your health care provider. This is important. Contact a health care provider if:  Your medicines do not relieve your vertigo or they make it worse.  You have a fever.  Your condition gets worse or you develop new symptoms.  Your family or friends notice any behavioral changes.  Your nausea or vomiting gets worse.  You  have numbness or a prickling and tingling sensation in part of your body. Get help right away if you:  Have difficulty moving or speaking.  Are always dizzy.  Faint.  Develop severe headaches.  Have weakness in your hands, arms, or legs.  Have changes in your hearing or vision.  Develop a stiff neck.  Develop sensitivity to light. Summary  Vertigo is the feeling that you or your surroundings are moving when they are not.  Your health care provider will do tests to determine the cause of your vertigo.  Follow instructions for home care. You may be told to avoid certain tasks, positions, or movements.  Contact a health care provider if your medicines do not relieve your symptoms, or if you have a fever, nausea, vomiting, or changes in behavior.  Get help right away if you have severe headaches or difficulty speaking, or you develop hearing or vision problems. This information is not intended to replace advice given to you by your health care provider. Make sure you discuss any questions you have with your health care provider. Document Revised: 12/26/2017 Document Reviewed: 12/26/2017 Elsevier Patient Education  2021 [employer].

## 2020-06-05 LAB — CBC WITH DIFFERENTIAL/PLATELET
Basophils Absolute: 0.1 10*3/uL (ref 0.0–0.2)
Basos: 1 %
EOS (ABSOLUTE): 0.4 10*3/uL (ref 0.0–0.4)
Eos: 6 %
Hematocrit: 42.3 % (ref 34.0–46.6)
Hemoglobin: 13.2 g/dL (ref 11.1–15.9)
Immature Grans (Abs): 0 10*3/uL (ref 0.0–0.1)
Immature Granulocytes: 0 %
Lymphocytes Absolute: 1.7 10*3/uL (ref 0.7–3.1)
Lymphs: 29 %
MCH: 24.7 pg — ABNORMAL LOW (ref 26.6–33.0)
MCHC: 31.2 g/dL — ABNORMAL LOW (ref 31.5–35.7)
MCV: 79 fL (ref 79–97)
Monocytes Absolute: 0.2 10*3/uL (ref 0.1–0.9)
Monocytes: 4 %
Neutrophils Absolute: 3.7 10*3/uL (ref 1.4–7.0)
Neutrophils: 60 %
Platelets: 210 10*3/uL (ref 150–450)
RBC: 5.35 x10E6/uL — ABNORMAL HIGH (ref 3.77–5.28)
RDW: 15.6 % — ABNORMAL HIGH (ref 11.7–15.4)
WBC: 6 10*3/uL (ref 3.4–10.8)

## 2020-06-05 LAB — COMPREHENSIVE METABOLIC PANEL
ALT: 42 IU/L — ABNORMAL HIGH (ref 0–32)
AST: 39 IU/L (ref 0–40)
Albumin/Globulin Ratio: 1.5 (ref 1.2–2.2)
Albumin: 4.1 g/dL (ref 3.8–4.9)
Alkaline Phosphatase: 160 IU/L — ABNORMAL HIGH (ref 44–121)
BUN/Creatinine Ratio: 11 (ref 9–23)
BUN: 7 mg/dL (ref 6–24)
Bilirubin Total: 0.5 mg/dL (ref 0.0–1.2)
CO2: 21 mmol/L (ref 20–29)
Calcium: 9.7 mg/dL (ref 8.7–10.2)
Chloride: 97 mmol/L (ref 96–106)
Creatinine, Ser: 0.63 mg/dL (ref 0.57–1.00)
Globulin, Total: 2.8 g/dL (ref 1.5–4.5)
Glucose: 263 mg/dL — ABNORMAL HIGH (ref 65–99)
Potassium: 3.7 mmol/L (ref 3.5–5.2)
Sodium: 137 mmol/L (ref 134–144)
Total Protein: 6.9 g/dL (ref 6.0–8.5)
eGFR: 107 mL/min/{1.73_m2} (ref >59)

## 2020-06-05 LAB — ANA: Anti Nuclear Antibody (ANA): NEGATIVE

## 2020-06-05 LAB — SEDIMENTATION RATE: Sed Rate: 31 mm/hr (ref 0–40)

## 2020-06-05 LAB — TSH: TSH: 2.75 u[IU]/mL (ref 0.450–4.500)

## 2020-06-05 LAB — BRAIN NATRIURETIC PEPTIDE: BNP: <2.5 pg/mL (ref 0.0–100.0)

## 2020-06-05 LAB — C-REACTIVE PROTEIN: CRP: 24 mg/L — ABNORMAL HIGH (ref 0–10)

## 2020-06-14 ENCOUNTER — Emergency Department (HOSPITAL_COMMUNITY)
Admission: EM | Admit: 2020-06-14 | Discharge: 2020-06-14 | Disposition: A | Discharge status: Home / Self Care | Payer: Federal, State, Local not specified - PPO | Attending: Emergency Medicine | Admitting: Emergency Medicine

## 2020-06-14 ENCOUNTER — Emergency Department (HOSPITAL_COMMUNITY): Payer: Federal, State, Local not specified - PPO

## 2020-06-14 DIAGNOSIS — Z79899 Other long term (current) drug therapy: Secondary | ICD-10-CM | POA: Diagnosis not present

## 2020-06-14 DIAGNOSIS — R072 Precordial pain: Secondary | ICD-10-CM | POA: Insufficient documentation

## 2020-06-14 DIAGNOSIS — Z794 Long term (current) use of insulin: Secondary | ICD-10-CM | POA: Insufficient documentation

## 2020-06-14 DIAGNOSIS — R112 Nausea with vomiting, unspecified: Secondary | ICD-10-CM | POA: Insufficient documentation

## 2020-06-14 DIAGNOSIS — R0789 Other chest pain: Secondary | ICD-10-CM

## 2020-06-14 DIAGNOSIS — E119 Type 2 diabetes mellitus without complications: Secondary | ICD-10-CM | POA: Insufficient documentation

## 2020-06-14 DIAGNOSIS — I10 Essential (primary) hypertension: Secondary | ICD-10-CM | POA: Insufficient documentation

## 2020-06-14 LAB — CBC WITH DIFFERENTIAL/PLATELET
Abs Immature Granulocytes: 0.05 10*3/uL (ref 0.00–0.07)
Basophils Absolute: 0 10*3/uL (ref 0.0–0.1)
Basophils Relative: 1 %
Eosinophils Absolute: 0.3 10*3/uL (ref 0.0–0.5)
Eosinophils Relative: 5 %
HCT: 37.2 % (ref 36.0–46.0)
Hemoglobin: 11.4 g/dL — ABNORMAL LOW (ref 12.0–15.0)
Immature Granulocytes: 1 %
Lymphocytes Relative: 34 %
Lymphs Abs: 2 10*3/uL (ref 0.7–4.0)
MCH: 24.6 pg — ABNORMAL LOW (ref 26.0–34.0)
MCHC: 30.6 g/dL (ref 30.0–36.0)
MCV: 80.2 fL (ref 80.0–100.0)
Monocytes Absolute: 0.3 10*3/uL (ref 0.1–1.0)
Monocytes Relative: 5 %
Neutro Abs: 3.3 10*3/uL (ref 1.7–7.7)
Neutrophils Relative %: 54 %
Platelets: 201 10*3/uL (ref 150–400)
RBC: 4.64 MIL/uL (ref 3.87–5.11)
RDW: 15.5 % (ref 11.5–15.5)
WBC: 6 10*3/uL (ref 4.0–10.5)
nRBC: 0 % (ref 0.0–0.2)

## 2020-06-14 LAB — TROPONIN I (HIGH SENSITIVITY)
Troponin I (High Sensitivity): <2 ng/L (ref <18)
Troponin I (High Sensitivity): <2 ng/L (ref <18)

## 2020-06-14 LAB — COMPREHENSIVE METABOLIC PANEL
ALT: 38 U/L (ref 0–44)
AST: 33 U/L (ref 15–41)
Albumin: 3.4 g/dL — ABNORMAL LOW (ref 3.5–5.0)
Alkaline Phosphatase: 101 U/L (ref 38–126)
Anion gap: 9 (ref 5–15)
BUN: 11 mg/dL (ref 6–20)
CO2: 25 mmol/L (ref 22–32)
Calcium: 9.4 mg/dL (ref 8.9–10.3)
Chloride: 103 mmol/L (ref 98–111)
Creatinine, Ser: 0.69 mg/dL (ref 0.44–1.00)
GFR, Estimated: >60 mL/min (ref >60)
Glucose, Bld: 126 mg/dL — ABNORMAL HIGH (ref 70–99)
Potassium: 3.6 mmol/L (ref 3.5–5.1)
Sodium: 137 mmol/L (ref 135–145)
Total Bilirubin: 0.5 mg/dL (ref 0.3–1.2)
Total Protein: 6.2 g/dL — ABNORMAL LOW (ref 6.5–8.1)

## 2020-06-14 MED ORDER — ASPIRIN 81 MG PO CHEW
324.0000 mg | CHEWABLE_TABLET | Freq: Once | ORAL | Status: DC
  Filled 2020-06-14: qty 4

## 2020-06-14 MED ORDER — IBUPROFEN 800 MG PO TABS
800.0000 mg | ORAL_TABLET | Freq: Once | ORAL | Status: AC
  Administered 2020-06-14: 800 mg via ORAL
  Filled 2020-06-14: qty 1

## 2020-06-14 MED ORDER — FENTANYL CITRATE (PF) 100 MCG/2ML IJ SOLN
50.0000 ug | Freq: Once | INTRAMUSCULAR | Status: AC
  Administered 2020-06-14: 50 ug via INTRAVENOUS
  Filled 2020-06-14: qty 2

## 2020-06-14 NOTE — ED Provider Notes (Signed)
6:36 AM BP 135/75   Pulse 87   Temp 98.4 F (36.9 C) (Oral)   Resp 16   Ht 5\' 1"  (1.549 m)   Wt 83.9 kg   LMP 05/12/2011   SpO2 96%   BMI 34.96 kg/m  Patient here with BL arm pain worse on the left. Multiple RF for CAD, Constant CP since 10 pm last night.  Hospitalization for the same a few yrs ago with negative provocative testing, Per PA [place], expect dc. Patieint taken at shift hand off.  HEART score of 4  I reviewed the patient's labs which have resulted. CBC shows mild normocytic anemia, CMP with slightly elevated blood glucose of insignificant value.  She has 2 negative troponins over time. On reevaluation of the patient she has reproducible midsternal bilateral pectoral and bilateral shoulder chest wall pain.  Think this is exacerbated by her posture which is forward rounded with shortened pectoral muscles.  Does not appear to have any emergent cause of her chest pain and this is likely chest wall pain.  No obvious signs or symptoms of ACS or PE.  I reviewed the patient's chest x-ray which shows no abnormalities.  Patient given a Profen for her chest wall pain.  Patient appears appropriate for discharge at this time with close outpatient follow-up.   Margarita Mail, PA-C 06/14/20 1006    Malvin Johns, MD 06/14/20 1007

## 2020-06-14 NOTE — ED Provider Notes (Signed)
[facility] EMERGENCY DEPARTMENT Provider Note   CSN: [id number] Arrival date & time: 06/14/20  2542     History Chief Complaint  Patient presents with  . Chest Pain    Morgan [name] is a 55 y.o. female.  Patient with history of T2DM, HLD, HTN, GERD, bipolar, anxiety presents for evaluation of substernal chest pain that started while at rest last night (4/29) around 10:00 pm. The pain radiates to the left shoulder and she reports both arms feel achy. She had nausea with vomiting last night that has resolved. No SOB/DOE. She can't identify any modifying factors. She denies known heart disease.    Chest Pain Associated symptoms: nausea and vomiting   Associated symptoms: no abdominal pain, no cough, no fever, no shortness of breath and no weakness        Past Medical History:  Diagnosis Date  . Anxiety   . Bipolar disorder ([place])   . Breast mass    right  . Cervical dysplasia   . Diabetes mellitus without complication ([place])   . Endometriosis   . GERD (gastroesophageal reflux disease)   . Hypercholesteremia   . Hypertension   . Migraine   . Pelvic kidney    left  . Postpartum depression    hx of    Patient Active Problem List   Diagnosis Date Noted  . 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  . Local superficial swelling, mass or lump 04/29/2020  . Shortness of breath 04/29/2020  . Fatigue 04/29/2020  . History of endometrial hyperplasia 08/12/2019  . Fatty liver disease, nonalcoholic 70/62/3762  . 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 History of robot-assisted laparoscopic hysterectomy 07/25/2019  . Body mass index (BMI) 33.0-33.9, adult 05/11/2019  . Cervical dysplasia 04/18/2019  . Diabetes type 2, uncontrolled ([place]) 04/18/2019  . High cholesterol 04/18/2019  . Other chronic pain 03/16/2019  . Lumbago with sciatica, right side 03/16/2019  . Degeneration of lumbar intervertebral disc 03/16/2019  . Chronic low back pain  03/16/2019  . Chest pain 06/14/2017  . Hypertension complicating diabetes ([place]) 06/14/2017  . Type 2 diabetes mellitus with other specified complication ([place]) 83/15/1761  . Hyperlipidemia associated with type 2 diabetes mellitus ([place]) 06/14/2017  . Displacement of lumbar intervertebral disc without myelopathy 09/20/2012  . Bipolar disorder [facility])     Past Surgical History:  Procedure Laterality Date  . ABDOMINAL HYSTERECTOMY N/A    Phreesia 07/30/2019  . BACK SURGERY  2014  . BREAST LUMPECTOMY WITH RADIOACTIVE SEED LOCALIZATION Right 10/28/2014   Procedure: RIGHT BREAST LUMPECTOMY WITH RADIOACTIVE SEED LOCALIZATION;  Surgeon: [name], MD;  Location: [facility];  Service: General;  Laterality: Right;  . BREAST LUMPECTOMY WITH RADIOACTIVE SEED LOCALIZATION Right 06/18/2019   Procedure: RIGHT BREAST RADIOACTIVE SEED LOCALIZATION LUMPECTOMY;  Surgeon: Jovita Kussmaul, MD;  Location: [facility];  Service: General;  Laterality: Right;  . BREAST SURGERY N/A    Phreesia 07/30/2019  . CESAREAN SECTION  03  . COLONOSCOPY WITH PROPOFOL N/A 05/01/2018   Procedure: COLONOSCOPY WITH PROPOFOL;  Surgeon: [name], MD;  Location: [facility] ENDOSCOPY;  Service: Gastroenterology;  Laterality: N/A;  . COLPOSCOPY    . ESOPHAGOGASTRODUODENOSCOPY (EGD) WITH PROPOFOL N/A 05/01/2018   Procedure: ESOPHAGOGASTRODUODENOSCOPY (EGD) WITH PROPOFOL;  Surgeon: [name], MD;  Location: [facility] ENDOSCOPY;  Service: Gastroenterology;  Laterality: N/A;  . ESOPHAGOGASTRODUODENOSCOPY (EGD) WITH PROPOFOL N/A 11/13/2018   Procedure: ESOPHAGOGASTRODUODENOSCOPY (EGD) WITH PROPOFOL;  Surgeon: [name], MD;  Location:  [facility] ENDOSCOPY;  Service: Gastroenterology;  Laterality: N/A;  . GYNECOLOGIC CRYOSURGERY    . NECK SURGERY  2012  . PELVIC LAPAROSCOPY  36     OB History    Gravida  1   Para  1   Term  1   Preterm      AB  0   Living  1     SAB      IAB      Ectopic  0   Multiple       Live Births              Family History  Problem Relation Age of Onset  . Diabetes Mother   . Breast cancer Mother 41  . Thyroid disease Mother   . Depression Mother   . Hypertension Father   . Heart disease Father   . Heart attack Father 78       Two instances, first at age 13  . Stroke Father   . Alcohol abuse Father   . Throat cancer Father   . Diabetes Maternal Grandmother   . Depression Maternal Grandmother   . Uterine cancer Maternal Grandmother     Social History   Tobacco Use  . Smoking status: Never Smoker  . Smokeless tobacco: Never Used  Vaping Use  . Vaping Use: Never used  Substance Use Topics  . Alcohol use: No  . Drug use: No    Home Medications Prior to Admission medications   Medication Sig Start Date End Date Taking? Authorizing Provider  amoxicillin (AMOXIL) 500 MG tablet Take 1 tablet (500 mg total) by mouth 2 (two) times daily. 05/14/20   Ronnell Freshwater, NP  ARIPiprazole (ABILIFY) 15 MG tablet Take 1 tablet by mouth at bedtime. 12/21/17   Noemi Chapel, NP  atorvastatin (LIPITOR) 40 MG tablet Take 1 tablet (40 mg total) by mouth daily. 04/14/20   Lorrene Reid, PA-C  butalbital-acetaminophen-caffeine (FIORICET) 50-325-40 MG tablet Take 1-2 tablets by mouth every 6 (six) hours as needed for headache. 11/16/19 11/15/20  Margarita Mail, PA-C  clonazePAM (KLONOPIN) 1 MG tablet Take 0.5 mg by mouth daily as needed for anxiety.  07/09/19   [provider]  cyclobenzaprine (FLEXERIL) 10 MG tablet Take 10 mg by mouth 3 (three) times daily as needed for muscle spasms.    [provider]  DULoxetine (CYMBALTA) 60 MG capsule Take 60 mg by mouth at bedtime. 11/04/16   [provider]  HYDROcodone-acetaminophen (NORCO/VICODIN) 5-325 MG tablet Take 1-2 tablets by mouth every 6 (six) hours as needed for moderate pain or severe pain. 06/18/19   [name], MD  insulin aspart (NOVOLOG) 100 UNIT/ML injection Inject 10 Units into the  skin 3 (three) times daily before meals. 07/31/19 01/15/20  Lorrene Reid, PA-C  losartan (COZAAR) 100 MG tablet Take 1 tablet daily by mouth. 05/14/20   Ronnell Freshwater, NP  meclizine (ANTIVERT) 12.5 MG tablet Take 1 tablet (12.5 mg total) by mouth 2 (two) times daily as needed for dizziness. 06/04/20   Lorrene Reid, PA-C  omeprazole (PRILOSEC) 20 MG capsule Take 20 mg by mouth at bedtime.     [provider]  XIGDUO XR 10-500 MG TB24 TAKE 1 TABLET BY MOUTH EVERY DAY 04/14/20   Lorrene Reid, PA-C    Allergies    Clindamycin/lincomycin, Other, and Lamotrigine  Review of Systems   Review of Systems  Constitutional: Negative for chills and fever.  HENT: Negative.   Respiratory: Negative.  Negative for cough and shortness of breath.   Cardiovascular: Positive for chest pain.  Gastrointestinal: Positive for nausea and vomiting. Negative for abdominal pain.  Musculoskeletal: Negative.   Skin: Negative.   Neurological: Negative.  Negative for weakness.    Physical Exam Updated Vital Signs BP 135/75   Pulse 87   Temp 98.4 F (36.9 C) (Oral)   Resp 16   Ht 5\' 1"  (1.549 m)   Wt 83.9 kg   LMP 05/12/2011   SpO2 96%   BMI 34.96 kg/m   Physical Exam Vitals and nursing note reviewed.  Constitutional:      General: She is not in acute distress.    Appearance: She is well-developed.  Cardiovascular:     Rate and Rhythm: Normal rate and regular rhythm.     Heart sounds: No murmur heard.   Pulmonary:     Effort: Pulmonary effort is normal.     Breath sounds: No wheezing, rhonchi or rales.  Chest:     Chest wall: No tenderness.  Abdominal:     Palpations: Abdomen is soft.     Tenderness: There is no abdominal tenderness.  Musculoskeletal:        General: Normal range of motion.     Right lower leg: No edema.     Left lower leg: No edema.  Skin:    General: Skin is warm and dry.  Neurological:     Mental Status: She is alert and oriented to person, place, and  time.     ED Results / Procedures / Treatments   Labs (all labs ordered are listed, but only abnormal results are displayed) Labs Reviewed  CBC WITH DIFFERENTIAL/PLATELET - Abnormal; Notable for the following components:      Result Value   Hemoglobin 11.4 (*)    MCH 24.6 (*)    All other components within normal limits  COMPREHENSIVE METABOLIC PANEL  TROPONIN I (HIGH SENSITIVITY)    EKG EKG Interpretation  Date/Time:  Saturday June 14 2020 05:28:06 EDT Ventricular Rate:  82 PR Interval:  161 QRS Duration: 83 QT Interval:  352 QTC Calculation: 412 R Axis:   63 Text Interpretation: Sinus rhythm Normal ECG Confirmed by Veryl Speak [phone]) on 06/14/2020 5:38:26 AM   Radiology No results found.  Procedures Procedures   Medications Ordered in ED Medications - No data to display  ED Course  I have reviewed the triage vital signs and the nursing notes.  Pertinent labs & imaging results that were available during my care of the patient were reviewed by me and considered in my medical decision making (see chart for details).    MDM Rules/Calculators/A&P                          Patient to ED with chest pain as further described in the HPI.   VSS, she is in NAD. EKG, labs, imaging pending. Aspirin provided, Fentanyl for pain. Will require reassessment. Will obtain troponin x 2 given her risk stratification.   Patient care signed out to Doreen Salvage, PA-C, pending review of tests.     Final Clinical Impression(s) / ED Diagnoses Final diagnoses:  None   1. Chest pain  Rx / DC Orders ED Discharge Orders    None       Charlann Lange, PA-C 06/14/20 5093    Veryl Speak, MD 06/14/20 2302

## 2020-06-14 NOTE — ED Triage Notes (Signed)
Pt bib gems c/o sudden onset of central, non radiating chest pain starting yesterday evening. Pt describes pain as pressure. Pt states her arms feel sore at some times. Pt had two episodes of nausea and vomitting that have now resolved. 324mg  asprin given PTA.   BP: 152/86  Spo2: 98% RA HR: 86  CBG: 155

## 2020-06-14 NOTE — Discharge Instructions (Addendum)
You have been diagnosed by your caregiver as having chest wall pain. °SEEK IMMEDIATE MEDICAL ATTENTION IF: °You develop a fever.  °Your chest pains become severe or intolerable.  °You develop new, unexplained symptoms (problems).  °You develop shortness of breath, nausea, vomiting, sweating or feel light headed.  °You develop a new cough or you cough up blood. ° °

## 2020-06-16 ENCOUNTER — Telehealth: Payer: Self-pay

## 2020-06-16 NOTE — Telephone Encounter (Signed)
Transition Care Management Unsuccessful Follow-up Telephone Call  Date of discharge and from where:  06/14/2020 from    Attempts:  1st Attempt  Reason for unsuccessful TCM follow-up call:  Left voice message     

## 2020-06-17 ENCOUNTER — Encounter: Payer: Self-pay | Admitting: Physician Assistant

## 2020-06-17 ENCOUNTER — Other Ambulatory Visit: Payer: Self-pay

## 2020-06-17 ENCOUNTER — Ambulatory Visit: Payer: Federal, State, Local not specified - PPO | Admitting: Physician Assistant

## 2020-06-17 VITALS — BP 125/71 | HR 95 | Temp 99.1°F | Ht 61.0 in | Wt 196.3 lb

## 2020-06-17 DIAGNOSIS — R0602 Shortness of breath: Secondary | ICD-10-CM

## 2020-06-17 DIAGNOSIS — R42 Dizziness and giddiness: Secondary | ICD-10-CM

## 2020-06-17 DIAGNOSIS — R229 Localized swelling, mass and lump, unspecified: Secondary | ICD-10-CM

## 2020-06-17 DIAGNOSIS — D649 Anemia, unspecified: Secondary | ICD-10-CM

## 2020-06-17 DIAGNOSIS — I10 Essential (primary) hypertension: Secondary | ICD-10-CM

## 2020-06-17 DIAGNOSIS — R0789 Other chest pain: Secondary | ICD-10-CM

## 2020-06-17 MED ORDER — PREDNISONE 20 MG PO TABS: ORAL_TABLET | ORAL | 0 refills | Status: DC

## 2020-06-17 NOTE — Patient Instructions (Signed)

## 2020-06-17 NOTE — Telephone Encounter (Signed)
Transition Care Management Unsuccessful Follow-up Telephone Call  Date of discharge and from where:  06/14/2020 from [facility]  Attempts:  2nd Attempt  Reason for unsuccessful TCM follow-up call:  Left voice message

## 2020-06-17 NOTE — Progress Notes (Signed)
anemi  Established Patient Office Visit  Subjective:  Patient ID: Morgan [name], female    DOB: Oct 23, 1965  Age: 55 y.o. MRN: [id number]  CC:  Chief Complaint  Patient presents with  . Dizziness       . Hypertension  . Edema    HPI Morgan [name] presents for follow up on dizziness, localized swelling of neck, and hypertension. Patient reports dizziness has improved. Meclizine has helped with symptoms. Reports shortness of breath is about the same. States on Saturday contacted EMS because her chest was hurting. Reports was on the couch playing a card game when the pain started. Has been checking BP at home which has remained stable and around the same as today. Continues to have intermittent palpitations/heart racing episodes. Patient also reports not happy with gradual weight gain despite making some dietary changes such as reducing snacks and sodas. Patient has a history of anemia and on iron supplement in the past.  Past Medical History:  Diagnosis Date  . Anxiety   . Bipolar disorder ([place])   . Breast mass    right  . Cervical dysplasia   . Diabetes mellitus without complication ([place])   . Endometriosis   . GERD (gastroesophageal reflux disease)   . Hypercholesteremia   . Hypertension   . Migraine   . Pelvic kidney    left  . Postpartum depression    hx of    Past Surgical History:  Procedure Laterality Date  . ABDOMINAL HYSTERECTOMY N/A    Phreesia 07/30/2019  . BACK SURGERY  2014  . BREAST LUMPECTOMY WITH RADIOACTIVE SEED LOCALIZATION Right 10/28/2014   Procedure: RIGHT BREAST LUMPECTOMY WITH RADIOACTIVE SEED LOCALIZATION;  Surgeon: [name], MD;  Location: [place];  Service: General;  Laterality: Right;  . BREAST LUMPECTOMY WITH RADIOACTIVE SEED LOCALIZATION Right 06/18/2019   Procedure: RIGHT BREAST RADIOACTIVE SEED LOCALIZATION LUMPECTOMY;  Surgeon: Jovita Kussmaul, MD;  Location: [place];  Service: General;  Laterality:  Right;  . BREAST SURGERY N/A    Phreesia 07/30/2019  . CESAREAN SECTION  03  . COLONOSCOPY WITH PROPOFOL N/A 05/01/2018   Procedure: COLONOSCOPY WITH PROPOFOL;  Surgeon: [name], MD;  [facility] ENDOSCOPY;  Service: Gastroenterology;  Laterality: N/A;  . COLPOSCOPY    . ESOPHAGOGASTRODUODENOSCOPY (EGD) WITH PROPOFOL N/A 05/01/2018   Procedure: ESOPHAGOGASTRODUODENOSCOPY (EGD) WITH PROPOFOL;  Surgeon: [name], MD;  Location: [facility] ENDOSCOPY;  Service: Gastroenterology;  Laterality: N/A;  . ESOPHAGOGASTRODUODENOSCOPY (EGD) WITH PROPOFOL N/A 11/13/2018   Procedure: ESOPHAGOGASTRODUODENOSCOPY (EGD) WITH PROPOFOL;  Surgeon: [name], MD;  Location: [facility] ENDOSCOPY;  Service: Gastroenterology;  Laterality: N/A;  . GYNECOLOGIC CRYOSURGERY    . NECK SURGERY  2012  . PELVIC LAPAROSCOPY  88    Family History  Problem Relation Age of Onset  . Diabetes Mother   . Breast cancer Mother 15  . Thyroid disease Mother   . Depression Mother   . Hypertension Father   . Heart disease Father   . Heart attack Father 53       Two instances, first at age 93  . Stroke Father   . Alcohol abuse Father   . Throat cancer Father   . Diabetes Maternal Grandmother   . Depression Maternal Grandmother   . Uterine cancer Maternal Grandmother     Social History   Socioeconomic History  . Marital status: Married    Spouse name: Not on file  . Number of children: Not on file  .  Years of education: Not on file  . Highest education level: Not on file  Occupational History  . Not on file  Tobacco Use  . Smoking status: Never Smoker  . Smokeless tobacco: Never Used  Vaping Use  . Vaping Use: Never used  Substance and Sexual Activity  . Alcohol use: No  . Drug use: No  . Sexual activity: Yes    Partners: Male    Birth control/protection: None    Comment: vasectomy  Other Topics Concern  . Not on file  Social History Narrative  . Not on file   Social Determinants of Health   Financial  Resource Strain: Not on file  Food Insecurity: Not on file  Transportation Needs: Not on file  Physical Activity: Not on file  Stress: Not on file  Social Connections: Not on file  Intimate Partner Violence: Not on file    Outpatient Medications Prior to Visit  Medication Sig Dispense Refill  . amoxicillin (AMOXIL) 500 MG tablet Take 1 tablet (500 mg total) by mouth 2 (two) times daily. 14 tablet 0  . ARIPiprazole (ABILIFY) 15 MG tablet Take 15 mg by mouth at bedtime.    Marland Kitchen atorvastatin (LIPITOR) 40 MG tablet Take 1 tablet (40 mg total) by mouth daily. 90 tablet 0  . butalbital-acetaminophen-caffeine (FIORICET) 50-325-40 MG tablet Take 1-2 tablets by mouth every 6 (six) hours as needed for headache. 20 tablet 0  . clonazePAM (KLONOPIN) 1 MG tablet Take 0.5 mg by mouth daily as needed for anxiety.     . cyclobenzaprine (FLEXERIL) 10 MG tablet Take 10 mg by mouth 3 (three) times daily as needed for muscle spasms.    . DULoxetine (CYMBALTA) 60 MG capsule Take 60 mg by mouth at bedtime.  5  . HYDROcodone-acetaminophen (NORCO/VICODIN) 5-325 MG tablet Take 1-2 tablets by mouth every 6 (six) hours as needed for moderate pain or severe pain. 10 tablet 0  . losartan (COZAAR) 100 MG tablet Take 1 tablet daily by mouth. (Patient taking differently: Take 50 mg by mouth daily.) 90 tablet 1  . meclizine (ANTIVERT) 12.5 MG tablet Take 1 tablet (12.5 mg total) by mouth 2 (two) times daily as needed for dizziness. 30 tablet 0  . omeprazole (PRILOSEC) 20 MG capsule Take 20 mg by mouth at bedtime.     Marland Kitchen XIGDUO XR 10-500 MG TB24 TAKE 1 TABLET BY MOUTH EVERY DAY (Patient taking differently: Take 1 tablet by mouth at bedtime.) 90 tablet 0  . insulin aspart (NOVOLOG) 100 UNIT/ML injection Inject 10 Units into the skin 3 (three) times daily before meals. 10 mL 2   No facility-administered medications prior to visit.    Allergies  Allergen Reactions  . Clindamycin/Lincomycin Rash    Diffuse drug reaction  eruption  . Other Hives, Rash and Itching  . Lamotrigine Rash    ROS Review of Systems A fourteen system review of systems was performed and found to be positive as per HPI.   Objective:    Physical Exam General:  Well Developed, well nourished, appropriate for stated age.  Neuro:  Alert and oriented,  extra-ocular muscles intact  HEENT:  Normocephalic, atraumatic, neck supple  Chest: Tenderness to palpation of sternum, normal excursion  Skin:  no gross rash, warm, pink. Cardiac:  RRR, S1 S2 wnl's  Respiratory:  ECTA B/L and A/P w/o wheezing, Not using accessory muscles, speaking in full sentences- unlabored. Vascular:  Ext warm, no cyanosis apprec.; cap RF less 2 sec. Psych:  No  HI/SI, judgement and insight good, Euthymic mood. Full Affect.  BP 125/71   Pulse 95   Temp 99.1 F (37.3 C)   Ht 5' 1" (1.549 m)   Wt 196 lb 4.8 oz (89 kg)   LMP 05/12/2011   SpO2 97%   BMI 37.09 kg/m  Wt Readings from Last 3 Encounters:  06/17/20 196 lb 4.8 oz (89 kg)  06/14/20 185 lb (83.9 kg)  06/04/20 191 lb 11.2 oz (87 kg)     Health Maintenance Due  Topic Date Due  . COVID-19 Vaccine (1) Never done  . OPHTHALMOLOGY EXAM  03/04/2019    There are no preventive care reminders to display for this patient.  Lab Results  Component Value Date   TSH 2.750 06/04/2020   Lab Results  Component Value Date   WBC 6.0 06/14/2020   HGB 11.4 (L) 06/14/2020   HCT 37.2 06/14/2020   MCV 80.2 06/14/2020   PLT 201 06/14/2020   Lab Results  Component Value Date   NA 137 06/14/2020   K 3.6 06/14/2020   CO2 25 06/14/2020   GLUCOSE 126 (H) 06/14/2020   BUN 11 06/14/2020   CREATININE 0.69 06/14/2020   BILITOT 0.5 06/14/2020   ALKPHOS 101 06/14/2020   AST 33 06/14/2020   ALT 38 06/14/2020   PROT 6.2 (L) 06/14/2020   ALBUMIN 3.4 (L) 06/14/2020   CALCIUM 9.4 06/14/2020   ANIONGAP 9 06/14/2020   EGFR 107 06/04/2020   Lab Results  Component Value Date   CHOL 138 03/24/2020   Lab  Results  Component Value Date   HDL 40 03/24/2020   Lab Results  Component Value Date   LDLCALC 73 03/24/2020   Lab Results  Component Value Date   TRIG 140 03/24/2020   Lab Results  Component Value Date   CHOLHDL 3.5 03/24/2020   Lab Results  Component Value Date   HGBA1C 6.9 (H) 03/24/2020      Assessment & Plan:   Problem List Items Addressed This Visit      Other   Local superficial swelling, mass or lump   Relevant Medications   predniSONE (DELTASONE) 20 MG tablet   Shortness of breath    Other Visit Diagnoses    Essential hypertension    -  Primary   Vertigo       Chest wall tenderness       Relevant Medications   predniSONE (DELTASONE) 20 MG tablet   Anemia, unspecified type         Vertigo: -Improved. -Recommend to continue meclizine as needed for dizziness.  -If symptoms fail to resolve or worsen recommend referral to PT for vestibular rehabilitation.   Essential hypertension: -Controlled. -Continue Losartan 50 mg. -Will continue to monitor.  Chest wall tenderness: -Reviewed ED note, labs and imaging. Discussed with patient likely MSK related given reproducible symptoms with palpation and also c/o left shoulder pain so will start corticosteroid therapy to improve symptoms and reduce inflammation. -Recommend to follow up with cardiology as scheduled.  Anemia: -Discussed recent CBC w/d shows mildly low hemoglobin and MCH suggestive of microcytic anemia and likely iron deficiency so recommend to restart iron supplement 325 mg every other day. -Will continue to monitor.  Shortness of breath: -BNP 06/04/2020 normal and chest x-ray 06/14/2020 shows clear lungs w/o evidence of edema, pleural effusion, pneumonia or pneumothorax. Echocardiogram 06/22/2017 revealed LVEF 60-65%, dilation of pulmonary artery (no elevated pulmonary pressure). Oxygen saturation today is wnl's, 97%. -Follow up with cardiology as  scheduled for further evaluation. -Other  etiologies to consider are anemia or psychogenic such as anxiety/panic attack.   Meds ordered this encounter  Medications  . predniSONE (DELTASONE) 20 MG tablet    Sig: Take 3 tablets by mouth daily x 2 days, then 2 tablets x 2 days, then 1 tablet x 2 days, then 0.5 tablet x 2 days    Dispense:  15 tablet    Refill:  0    Order Specific Question:   Supervising Provider    Answer:   Beatrice Lecher D [[id number]]    Follow-up: Return As scheduled.    Lorrene Reid, PA-C

## 2020-06-18 NOTE — Telephone Encounter (Signed)
Transition Care Management Unsuccessful Follow-up Telephone Call  Date of discharge and from where:  06/14/2020 from [facility]  Attempts:  3rd Attempt  Reason for unsuccessful TCM follow-up call:  Unable to reach patient

## 2020-06-25 ENCOUNTER — Telehealth: Payer: Self-pay | Admitting: Physician Assistant

## 2020-06-25 DIAGNOSIS — I959 Hypotension, unspecified: Secondary | ICD-10-CM

## 2020-06-25 DIAGNOSIS — I10 Essential (primary) hypertension: Secondary | ICD-10-CM

## 2020-06-25 MED ORDER — HYDROCHLOROTHIAZIDE 12.5 MG PO TABS: 12.5000 mg | ORAL_TABLET | Freq: Every day | ORAL | 0 refills | Status: DC

## 2020-06-25 MED ORDER — LOSARTAN POTASSIUM 100 MG PO TABS: 100.0000 mg | ORAL_TABLET | Freq: Every day | ORAL | 0 refills | Status: DC

## 2020-06-25 NOTE — Telephone Encounter (Signed)
Patient states BP has been consistently high 140/90 since Saturday. Patient states she has been taking 100mg  Losartan daily since Saturday. Pt states she is having headaches.    Per Herb Grays sending HCTZ 12.5mg -1 po qd. Patient advised to take this along with Losartan. Pt advised to keep 2 week log of BPs at home and send to [place] via mychart. Patient advised if BP consistently above 140/90 to contact the office. Pt verbalized understanding and was agreeable.  Med list updated. AS, CMA

## 2020-06-25 NOTE — Telephone Encounter (Signed)
Patient's blood pressure has been high since Saturday. It went as high as 174/94. Please advise, thanks.

## 2020-06-25 NOTE — Addendum Note (Signed)
Addended by: Mickel Crow on: 06/25/2020 03:55 PM   Modules accepted: Orders

## 2020-07-01 ENCOUNTER — Other Ambulatory Visit: Payer: Self-pay | Admitting: General Surgery

## 2020-07-01 DIAGNOSIS — Z9889 Other specified postprocedural states: Secondary | ICD-10-CM

## 2020-07-08 ENCOUNTER — Encounter: Payer: Self-pay | Admitting: Interventional Cardiology

## 2020-07-08 ENCOUNTER — Ambulatory Visit: Payer: Federal, State, Local not specified - PPO | Admitting: Interventional Cardiology

## 2020-07-08 ENCOUNTER — Encounter: Payer: Self-pay | Admitting: Radiology

## 2020-07-08 ENCOUNTER — Ambulatory Visit (INDEPENDENT_AMBULATORY_CARE_PROVIDER_SITE_OTHER): Payer: Federal, State, Local not specified - PPO

## 2020-07-08 ENCOUNTER — Other Ambulatory Visit: Payer: Self-pay

## 2020-07-08 VITALS — BP 130/86 | HR 101 | Ht 61.0 in | Wt 191.2 lb

## 2020-07-08 DIAGNOSIS — R002 Palpitations: Secondary | ICD-10-CM

## 2020-07-08 DIAGNOSIS — I10 Essential (primary) hypertension: Secondary | ICD-10-CM

## 2020-07-08 DIAGNOSIS — R0602 Shortness of breath: Secondary | ICD-10-CM

## 2020-07-08 NOTE — Progress Notes (Signed)
Enrolled patient for a 14 day Zio XT Monitor to be mailed to patients home  

## 2020-07-08 NOTE — Progress Notes (Signed)
Cardiology Office Note   Date:  07/08/2020   ID:  Morgan [name], DOB Nov 17, 1965, MRN [id number]  PCP:  Morgan Reid, PA-C    No chief complaint on file.  Chest pain  Wt Readings from Last 3 Encounters:  07/08/20 191 lb 3.2 oz (86.7 kg)  06/17/20 196 lb 4.8 oz (89 kg)  06/14/20 185 lb (83.9 kg)       History of Present Illness: Morgan [name] is a 55 y.o. female who is being seen today for the evaluation of chest pain at the request of Morgan Reid, PA-C.  In April 2022, patient was seen in the emergency room and diagnosed with chest wall pain.  She had a prolonged chest pain for 6 hours.  She called EMS who took her to the hospital.    She continues to have chest pain and occasional shortness of breath.  She feels hot all of the time. She has palpitations daily, lasting a few minutes at a time.   Father has had MI.  Brothers are healthy.   Losartan was recently adjusted for low BP.    Past Medical History:  Diagnosis Date  . Anxiety   . Bipolar disorder ([place])   . Breast mass    right  . Cervical dysplasia   . Diabetes mellitus without complication ([place])   . Endometriosis   . GERD (gastroesophageal reflux disease)   . Hypercholesteremia   . Hypertension   . Migraine   . Pelvic kidney    left  . Postpartum depression    hx of    Past Surgical History:  Procedure Laterality Date  . ABDOMINAL HYSTERECTOMY N/A    Phreesia 07/30/2019  . BACK SURGERY  2014  . BREAST LUMPECTOMY WITH RADIOACTIVE SEED LOCALIZATION Right 10/28/2014   Procedure: RIGHT BREAST LUMPECTOMY WITH RADIOACTIVE SEED LOCALIZATION;  Surgeon: [name], MD;  Location: [place];  Service: General;  Laterality: Right;  . BREAST LUMPECTOMY WITH RADIOACTIVE SEED LOCALIZATION Right 06/18/2019   Procedure: RIGHT BREAST RADIOACTIVE SEED LOCALIZATION LUMPECTOMY;  Surgeon: Jovita Kussmaul, MD;  Location: [place];  Service: General;  Laterality: Right;  .  BREAST SURGERY N/A    Phreesia 07/30/2019  . CESAREAN SECTION  03  . COLONOSCOPY WITH PROPOFOL N/A 05/01/2018   Procedure: COLONOSCOPY WITH PROPOFOL;  Surgeon: [name], MD;  Location: [facility] ENDOSCOPY;  Service: Gastroenterology;  Laterality: N/A;  . COLPOSCOPY    . ESOPHAGOGASTRODUODENOSCOPY (EGD) WITH PROPOFOL N/A 05/01/2018   Procedure: ESOPHAGOGASTRODUODENOSCOPY (EGD) WITH PROPOFOL;  Surgeon: [name], MD;  Location: [facility] ENDOSCOPY;  Service: Gastroenterology;  Laterality: N/A;  . ESOPHAGOGASTRODUODENOSCOPY (EGD) WITH PROPOFOL N/A 11/13/2018   Procedure: ESOPHAGOGASTRODUODENOSCOPY (EGD) WITH PROPOFOL;  Surgeon: [name], MD;  Location: [facility] ENDOSCOPY;  Service: Gastroenterology;  Laterality: N/A;  . GYNECOLOGIC CRYOSURGERY    . NECK SURGERY  2012  . PELVIC LAPAROSCOPY  89     Current Outpatient Medications  Medication Sig Dispense Refill  . ARIPiprazole (ABILIFY) 15 MG tablet Take 15 mg by mouth at bedtime.    Marland Kitchen atorvastatin (LIPITOR) 40 MG tablet Take 1 tablet (40 mg total) by mouth daily. 90 tablet 0  . butalbital-acetaminophen-caffeine (FIORICET) 50-325-40 MG tablet Take 1-2 tablets by mouth every 6 (six) hours as needed for headache. 20 tablet 0  . clonazePAM (KLONOPIN) 1 MG tablet Take 0.5 mg by mouth daily as needed for anxiety.     . cyclobenzaprine (FLEXERIL) 10 MG tablet Take 10  mg by mouth 3 (three) times daily as needed for muscle spasms.    . DULoxetine (CYMBALTA) 60 MG capsule Take 60 mg by mouth at bedtime.  5  . hydrochlorothiazide (HYDRODIURIL) 12.5 MG tablet Take 1 tablet (12.5 mg total) by mouth daily. 30 tablet 0  . HYDROcodone-acetaminophen (NORCO/VICODIN) 5-325 MG tablet Take 1-2 tablets by mouth every 6 (six) hours as needed for moderate pain or severe pain. 10 tablet 0  . insulin aspart (NOVOLOG) 100 UNIT/ML injection Inject 10 Units into the skin 3 (three) times daily before meals.    Marland Kitchen losartan (COZAAR) 100 MG tablet Take 1 tablet (100 mg total) by mouth  daily. Take 1 tablet daily by mouth. 90 tablet 0  . meclizine (ANTIVERT) 12.5 MG tablet Take 1 tablet (12.5 mg total) by mouth 2 (two) times daily as needed for dizziness. 30 tablet 0  . omeprazole (PRILOSEC) 20 MG capsule Take 20 mg by mouth at bedtime.     Marland Kitchen XIGDUO XR 10-500 MG TB24 TAKE 1 TABLET BY MOUTH EVERY DAY (Patient taking differently: Take 1 tablet by mouth at bedtime.) 90 tablet 0   No current facility-administered medications for this visit.    Allergies:   Clindamycin/lincomycin, Other, and Lamotrigine    Social History:  The patient  reports that she has never smoked. She has never used smokeless tobacco. She reports that she does not drink alcohol and does not use drugs.   Family History:  The patient's family history includes Alcohol abuse in her father; Breast cancer (age of onset: 33) in her mother; Depression in her maternal grandmother and mother; Diabetes in her maternal grandmother and mother; Heart attack (age of onset: 55) in her father; Heart disease in her father; Hypertension in her father; Stroke in her father; Throat cancer in her father; Thyroid disease in her mother; Uterine cancer in her maternal grandmother.    ROS:  Please see the history of present illness.   Otherwise, review of systems are positive for palpitations.   All other systems are reviewed and negative.    PHYSICAL EXAM: VS:  BP 130/86   Pulse (!) 101   Ht 5\' 1"  (1.549 m)   Wt 191 lb 3.2 oz (86.7 kg)   LMP 05/12/2011   SpO2 96%   BMI 36.13 kg/m  , BMI Body mass index is 36.13 kg/m. GEN: Well nourished, well developed, in no acute distress  HEENT: normal  Neck: no JVD, carotid bruits, or masses Cardiac: RRR; no murmurs, rubs, or gallops,no edema  Respiratory:  clear to auscultation bilaterally, normal work of breathing GI: soft, nontender, nondistended, + BS MS: no deformity or atrophy  Skin: warm and dry, no rash Neuro:  Strength and sensation are intact Psych: euthymic mood, full  affect   EKG:   The ekg ordered today demonstrates sinus tach, no ST changes   Recent Labs: 06/04/2020: BNP <2.5; TSH 2.750 06/14/2020: ALT 38; BUN 11; Creatinine, Ser 0.69; Hemoglobin 11.4; Platelets 201; Potassium 3.6; Sodium 137   Lipid Panel    Component Value Date/Time   CHOL 138 03/24/2020 1123   TRIG 140 03/24/2020 1123   HDL 40 03/24/2020 1123   CHOLHDL 3.5 03/24/2020 1123   CHOLHDL 3.7 06/16/2017 0417   VLDL 29 06/16/2017 0417   LDLCALC 73 03/24/2020 1123     Other studies Reviewed: Additional studies/ records that were reviewed today with results demonstrating: 2019 echo reviewed; ER records reviewed.   ASSESSMENT AND PLAN:  1. Shortness of  breath: Dilated pulm artery noted in 2019.  Will recheck echo. High HR.  Otherwise, no signs of CHF on exam.  2. Palpitations: Plan for Zio monitor. No syncope. Does not appear to be life-threatening arrhythmia.  3. HTN: The current medical regimen is effective;  continue present plan and medications. 4. Morbid obesity:  Whole food, plant based diet.  Avoid processed foods. 5. Diabetes: A1C 6.9.  [employer] and [employer].  She has been diabetic for 3 years.  Increase fiber intake.     Current medicines are reviewed at length with the patient today.  The patient concerns regarding her medicines were addressed.  The following changes have been made:  No change  Labs/ tests ordered today include:  No orders of the defined types were placed in this encounter.   Recommend 150 minutes/week of aerobic exercise Low fat, low carb, high fiber diet recommended  Disposition:   FU based on test   Signed, Larae Grooms, MD  07/08/2020 4:38 PM    [facility] [place], [place], [place]  [address] Phone: [phone]; Fax: [phone]

## 2020-07-08 NOTE — Patient Instructions (Signed)
Medication Instructions:  Your physician recommends that you continue on your current medications as directed. Please refer to the Current Medication list given to you today.  *If you need a refill on your cardiac medications before your next appointment, please call your pharmacy*   Lab Work: none If you have labs (blood work) drawn today and your tests are completely normal, you will receive your results only by: Marland Kitchen MyChart Message (if you have MyChart) OR . A paper copy in the mail If you have any lab test that is abnormal or we need to change your treatment, we will call you to review the results.   Testing/Procedures: Your physician has recommended that you wear a holter monitor. Holter monitors are medical devices that record the heart's electrical activity. Doctors most often use these monitors to diagnose arrhythmias. Arrhythmias are problems with the speed or rhythm of the heartbeat. The monitor is a small, portable device. You can wear one while you do your normal daily activities. This is usually used to diagnose what is causing palpitations/syncope (passing out). 14 day zio  Your physician has requested that you have an echocardiogram. Echocardiography is a painless test that uses sound waves to create images of your heart. It provides your doctor with information about the size and shape of your heart and how well your heart's chambers and valves are working. This procedure takes approximately one hour. There are no restrictions for this procedure.     Follow-Up: At [facility], you and your health needs are our priority.  As part of our continuing mission to provide you with exceptional heart care, we have created designated Provider Care Teams.  These Care Teams include your primary Cardiologist (physician) and Advanced Practice Providers (APPs -  Physician Assistants and Nurse Practitioners) who all work together to provide you with the care you need, when you need it.  We  recommend signing up for the patient portal called "MyChart".  Sign up information is provided on this After Visit Summary.  MyChart is used to connect with patients for Virtual Visits (Telemedicine).  Patients are able to view lab/test results, encounter notes, upcoming appointments, etc.  Non-urgent messages can be sent to your provider as well.   To learn more about what you can do with MyChart, go to [email or web address].    Your next appointment:   Based on results  The format for your next appointment:   In Person  Provider:   You may see Dr Irish Lack or one of the following Advanced Practice Providers on your designated Care Team:    Melina Copa, PA-C  Ermalinda Barrios, PA-C    Other Instructions Bryn Gulling- Long Term Monitor Instructions   Your physician has requested you wear a ZIO patch monitor for _14__ days.  This is a single patch monitor.   [employer] supplies one patch monitor per enrollment. Additional stickers are not available. Please do not apply patch if you will be having a Nuclear Stress Test, Echocardiogram, Cardiac CT, MRI, or Chest Xray during the period you would be wearing the monitor. The patch cannot be worn during these tests. You cannot remove and re-apply the ZIO XT patch monitor.  Your ZIO patch monitor will be sent Fed Ex from [employer] directly to your home address. It may take 3-5 days to receive your monitor after you have been enrolled.  Once you have received your monitor, please review the enclosed instructions. Your monitor has already been registered assigning a specific monitor serial #  to you.  Billing and Patient Assistance Program Information   We have supplied [employer] with any of your insurance information on file for billing purposes. [employer] offers a sliding scale Patient Assistance Program for patients that do not have insurance, or whose insurance does not completely cover the cost of the ZIO monitor.   You must apply for the Patient  Assistance Program to qualify for this discounted rate.     To apply, please call [employer] at [phone], select option 4, then select option 2, and ask to apply for Patient Assistance Program.  Theodore Demark will ask your household income, and how many people are in your household.  They will quote your out-of-pocket cost based on that information.  [employer] will also be able to set up a 52-month, interest-free payment plan if needed.  Applying the monitor   Shave hair from upper left chest.  Hold abrader disc by orange tab. Rub abrader in 40 strokes over the upper left chest as indicated in your monitor instructions.  Clean area with 4 enclosed alcohol pads. Let dry.  Apply patch as indicated in monitor instructions. Patch will be placed under collarbone on left side of chest with arrow pointing upward.  Rub patch adhesive wings for 2 minutes. Remove white label marked "1". Remove the white label marked "2". Rub patch adhesive wings for 2 additional minutes.  While looking in a mirror, press and release button in center of patch. A small green light will flash 3-4 times. This will be your only indicator that the monitor has been turned on. ?  Do not shower for the first 24 hours. You may shower after the first 24 hours.  Press the button if you feel a symptom. You will hear a small click. Record Date, Time and Symptom in the Patient Logbook.  When you are ready to remove the patch, follow instructions on the last 2 pages of the Patient Logbook. Stick patch monitor onto the last page of Patient Logbook.  Place Patient Logbook in the blue and white box.  Use locking tab on box and tape box closed securely.  The blue and white box has prepaid postage on it. Please place it in the mailbox as soon as possible. Your physician should have your test results approximately 7 days after the monitor has been mailed back to [facility].  Call [facility] at [phone] if you have questions  regarding your ZIO XT patch monitor. Call them immediately if you see an orange light blinking on your monitor.  If your monitor falls off in less than 4 days, contact our Monitor department at [phone]. ?If your monitor becomes loose or falls off after 4 days call [employer] at [phone] for suggestions on securing your monitor.?

## 2020-07-12 DIAGNOSIS — R002 Palpitations: Secondary | ICD-10-CM | POA: Diagnosis not present

## 2020-07-19 ENCOUNTER — Other Ambulatory Visit: Payer: Self-pay | Admitting: Physician Assistant

## 2020-07-19 DIAGNOSIS — E1169 Type 2 diabetes mellitus with other specified complication: Secondary | ICD-10-CM

## 2020-07-22 ENCOUNTER — Ambulatory Visit: Payer: Federal, State, Local not specified - PPO | Admitting: Physician Assistant

## 2020-07-25 ENCOUNTER — Ambulatory Visit
Admission: EM | Admit: 2020-07-25 | Discharge: 2020-07-25 | Disposition: A | Discharge status: Home / Self Care | Payer: Federal, State, Local not specified - PPO | Attending: Emergency Medicine | Admitting: Emergency Medicine

## 2020-07-25 ENCOUNTER — Telehealth: Payer: Self-pay | Admitting: Physician Assistant

## 2020-07-25 ENCOUNTER — Other Ambulatory Visit: Payer: Self-pay

## 2020-07-25 DIAGNOSIS — R739 Hyperglycemia, unspecified: Secondary | ICD-10-CM

## 2020-07-25 DIAGNOSIS — Z794 Long term (current) use of insulin: Secondary | ICD-10-CM

## 2020-07-25 DIAGNOSIS — E1165 Type 2 diabetes mellitus with hyperglycemia: Secondary | ICD-10-CM

## 2020-07-25 DIAGNOSIS — E119 Type 2 diabetes mellitus without complications: Secondary | ICD-10-CM

## 2020-07-25 LAB — POCT URINALYSIS DIP (MANUAL ENTRY)
Bilirubin, UA: NEGATIVE
Blood, UA: NEGATIVE
Glucose, UA: >=1000 mg/dL — AB
Ketones, POC UA: NEGATIVE mg/dL
Leukocytes, UA: NEGATIVE
Nitrite, UA: NEGATIVE
Protein Ur, POC: NEGATIVE mg/dL
Spec Grav, UA: <=1.005 — AB (ref 1.010–1.025)
Urobilinogen, UA: 0.2 E.U./dL
pH, UA: 5.5 (ref 5.0–8.0)

## 2020-07-25 LAB — POCT FASTING CBG KUC MANUAL ENTRY: POCT Glucose (KUC): 199 mg/dL — AB (ref 70–99)

## 2020-07-25 NOTE — Telephone Encounter (Signed)
Patient is having trouble with her blood sugar running high. It has been over 400 for two days and mostly running between 200-300. Please advise, thanks.

## 2020-07-25 NOTE — Discharge Instructions (Addendum)
Blood sugar 199 today Drink plenty of fluids Continue insulin and xigduo We are checking blood work-contact PCP for further recommendations changing meds If developing increased dizziness, lightheadedness, nausea, vomiting, abdominal pain, or blood sugars greater than 400 please go to the emergency room

## 2020-07-25 NOTE — ED Provider Notes (Signed)
[facility]    CSN: [id number] Arrival date & time: 07/25/20  1124      History   Chief Complaint Chief Complaint  Patient presents with   Hyperglycemia    HPI Morgan [name] is a 55 y.o. female history of hypertension, migraines, DM type II, presenting today for evaluation of elevated blood sugars.  Reports that over the past 4 days her sugars have been elevated, up to 480, generally 2 50-4 80.  Is on insulin 10 units 3 times daily along with the Dapagliflozin/metformin combo.  Denies any recent changes to medicines.  Denies any changes to diet/lifestyle.  Reports occasional nausea, denies vomiting.  Denies abdominal pain.  Has felt slightly more tired than normal.  Associated urinary frequency and polydipsia.  Has follow-up appointment with PCP on Tuesday, on chart review they were requesting lab work and A1c.  HPI  Past Medical History:  Diagnosis Date   Anxiety    Bipolar disorder ([place])    Breast mass    right   Cervical dysplasia    Diabetes mellitus without complication ([place])    Endometriosis    GERD (gastroesophageal reflux disease)    Hypercholesteremia    Hypertension    Migraine    Pelvic kidney    left   Postpartum depression    hx of    Patient Active Problem List   Diagnosis Date Noted   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   Local superficial swelling, mass or lump 04/29/2020   Shortness of breath 04/29/2020   Fatigue 04/29/2020   History of endometrial hyperplasia 08/12/2019   Fatty liver disease, nonalcoholic 57/26/2035   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   History of robot-assisted laparoscopic hysterectomy 07/25/2019   Body mass index (BMI) 33.0-33.9, adult 05/11/2019   Cervical dysplasia 04/18/2019   Diabetes type 2, uncontrolled ([place]) 04/18/2019   High cholesterol 04/18/2019   Other chronic pain 03/16/2019   Lumbago with sciatica, right side 03/16/2019   Degeneration of lumbar intervertebral disc  03/16/2019   Chronic low back pain 03/16/2019   Chest pain 06/14/2017   Hypertension complicating diabetes ([place]) 06/14/2017   Type 2 diabetes mellitus with other specified complication ([place]) 59/74/1638   Hyperlipidemia associated with type 2 diabetes mellitus ([place]) 06/14/2017   Displacement of lumbar intervertebral disc without myelopathy 09/20/2012   Bipolar disorder ([place])     Past Surgical History:  Procedure Laterality Date   ABDOMINAL HYSTERECTOMY N/A    Phreesia 07/30/2019   BACK SURGERY  2014   BREAST LUMPECTOMY WITH RADIOACTIVE SEED LOCALIZATION Right 10/28/2014   Procedure: RIGHT BREAST LUMPECTOMY WITH RADIOACTIVE SEED LOCALIZATION;  Surgeon: [name], MD;  Location: [facility];  Service: General;  Laterality: Right;   BREAST LUMPECTOMY WITH RADIOACTIVE SEED LOCALIZATION Right 06/18/2019   Procedure: RIGHT BREAST RADIOACTIVE SEED LOCALIZATION LUMPECTOMY;  Surgeon: Jovita Kussmaul, MD;  Location: [place];  Service: General;  Laterality: Right;   BREAST SURGERY N/A    Phreesia 07/30/2019   CESAREAN SECTION  03   COLONOSCOPY WITH PROPOFOL N/A 05/01/2018   Procedure: COLONOSCOPY WITH PROPOFOL;  Surgeon: [name], MD;  Location: [facility] ENDOSCOPY;  Service: Gastroenterology;  Laterality: N/A;   COLPOSCOPY     ESOPHAGOGASTRODUODENOSCOPY (EGD) WITH PROPOFOL N/A 05/01/2018   Procedure: ESOPHAGOGASTRODUODENOSCOPY (EGD) WITH PROPOFOL;  Surgeon: [name], MD;  [facility] ENDOSCOPY;  Service: Gastroenterology;  Laterality: N/A;   ESOPHAGOGASTRODUODENOSCOPY (EGD) WITH PROPOFOL N/A 11/13/2018   Procedure: ESOPHAGOGASTRODUODENOSCOPY (EGD) WITH PROPOFOL;  Surgeon: [name], MD;  Location: [facility] ENDOSCOPY;  Service: Gastroenterology;  Laterality: N/A;   GYNECOLOGIC CRYOSURGERY     NECK SURGERY  2012   PELVIC LAPAROSCOPY  57    OB History     Gravida  1   Para  1   Term  1   Preterm      AB  0   Living  1      SAB      IAB       Ectopic  0   Multiple      Live Births               Home Medications    Prior to Admission medications   Medication Sig Start Date End Date Taking? Authorizing Provider  ARIPiprazole (ABILIFY) 15 MG tablet Take 15 mg by mouth at bedtime. 12/21/17   Noemi Chapel, NP  atorvastatin (LIPITOR) 40 MG tablet Take 1 tablet (40 mg total) by mouth daily. 04/14/20   Lorrene Reid, PA-C  butalbital-acetaminophen-caffeine (FIORICET) 50-325-40 MG tablet Take 1-2 tablets by mouth every 6 (six) hours as needed for headache. 11/16/19 11/15/20  Margarita Mail, PA-C  clonazePAM (KLONOPIN) 1 MG tablet Take 0.5 mg by mouth daily as needed for anxiety.  07/09/19   [provider]  cyclobenzaprine (FLEXERIL) 10 MG tablet Take 10 mg by mouth 3 (three) times daily as needed for muscle spasms.    [provider]  DULoxetine (CYMBALTA) 60 MG capsule Take 60 mg by mouth at bedtime. 11/04/16   [provider]  hydrochlorothiazide (HYDRODIURIL) 12.5 MG tablet Take 1 tablet (12.5 mg total) by mouth daily. 06/25/20   Lorrene Reid, PA-C  HYDROcodone-acetaminophen (NORCO/VICODIN) 5-325 MG tablet Take 1-2 tablets by mouth every 6 (six) hours as needed for moderate pain or severe pain. 06/18/19   [name], MD  insulin aspart (NOVOLOG) 100 UNIT/ML injection Inject 10 Units into the skin 3 (three) times daily before meals.    [provider]  losartan (COZAAR) 100 MG tablet Take 1 tablet (100 mg total) by mouth daily. Take 1 tablet daily by mouth. 06/25/20   Lorrene Reid, PA-C  meclizine (ANTIVERT) 12.5 MG tablet Take 1 tablet (12.5 mg total) by mouth 2 (two) times daily as needed for dizziness. 06/04/20   Lorrene Reid, PA-C  omeprazole (PRILOSEC) 20 MG capsule Take 20 mg by mouth at bedtime.     [provider]  XIGDUO XR 10-500 MG TB24 TAKE 1 TABLET BY MOUTH EVERY DAY 07/21/20   Lorrene Reid, PA-C    Family History Family History  Problem Relation Age of Onset    Diabetes Mother    Breast cancer Mother 105   Thyroid disease Mother    Depression Mother    Hypertension Father    Heart disease Father    Heart attack Father 65       Two instances, first at age 36   Stroke Father    Alcohol abuse Father    Throat cancer Father    Diabetes Maternal Grandmother    Depression Maternal Grandmother    Uterine cancer Maternal Grandmother     Social History Social History   Tobacco Use   Smoking status: Never   Smokeless tobacco: Never  Vaping Use   Vaping Use: Never used  Substance Use Topics   Alcohol use: No   Drug use: No     Allergies   Clindamycin/lincomycin, Other, and Lamotrigine   Review of Systems Review  of Systems  Constitutional:  Negative for fatigue and fever.  HENT:  Negative for congestion, sinus pressure and sore throat.   Eyes:  Negative for photophobia, pain and visual disturbance.  Respiratory:  Negative for cough and shortness of breath.   Cardiovascular:  Negative for chest pain.  Gastrointestinal:  Positive for nausea. Negative for abdominal pain and vomiting.  Genitourinary:  Negative for decreased urine volume and hematuria.  Musculoskeletal:  Negative for myalgias, neck pain and neck stiffness.  Neurological:  Positive for headaches. Negative for dizziness, syncope, facial asymmetry, speech difficulty, weakness, light-headedness and numbness.    Physical Exam Triage Vital Signs ED Triage Vitals  Enc Vitals Group     BP 07/25/20 1227 97/61     Pulse Rate 07/25/20 1227 (!) 106     Resp 07/25/20 1227 18     Temp 07/25/20 1227 97.8 F (36.6 C)     Temp Source 07/25/20 1227 Oral     SpO2 07/25/20 1227 96 %     Weight --      Height --      Head Circumference --      Peak Flow --      Pain Score 07/25/20 1228 0     Pain Loc --      Pain Edu? --      Excl. in [place]? --    No data found.  Updated Vital Signs BP 97/61 (BP Location: Right Arm)   Pulse (!) 106   Temp 97.8 F (36.6 C) (Oral)   Resp 18    LMP 05/12/2011   SpO2 96%   Visual Acuity Right Eye Distance:   Left Eye Distance:   Bilateral Distance:    Right Eye Near:   Left Eye Near:    Bilateral Near:     Physical Exam Vitals and nursing note reviewed.  Constitutional:      Appearance: She is well-developed.     Comments: No acute distress  HENT:     Head: Normocephalic and atraumatic.     Nose: Nose normal.  Eyes:     Conjunctiva/sclera: Conjunctivae normal.  Cardiovascular:     Rate and Rhythm: Normal rate.  Pulmonary:     Effort: Pulmonary effort is normal. No respiratory distress.  Abdominal:     General: There is no distension.  Musculoskeletal:        General: Normal range of motion.     Cervical back: Neck supple.  Skin:    General: Skin is warm and dry.  Neurological:     Mental Status: She is alert and oriented to person, place, and time.     UC Treatments / Results  Labs (all labs ordered are listed, but only abnormal results are displayed) Labs Reviewed  POCT URINALYSIS DIP (MANUAL ENTRY) - Abnormal; Notable for the following components:      Result Value   Glucose, UA >=1,000 (*)    Spec Grav, UA <=1.005 (*)    All other components within normal limits  POCT FASTING CBG [facility] MANUAL ENTRY - Abnormal; Notable for the following components:   POCT Glucose ([facility]) 199 (*)    All other components within normal limits  CBC  BASIC METABOLIC PANEL  HEMOGLOBIN A1C    EKG   Radiology No results found.  Procedures Procedures (including critical care time)  Medications Ordered in UC Medications - No data to display  Initial Impression / Assessment and Plan / UC Course  I have reviewed the triage  vital signs and the nursing notes.  Pertinent labs & imaging results that were available during my care of the patient were reviewed by me and considered in my medical decision making (see chart for details).  Clinical Course as of 07/25/20 1327  Fri Jul 25, 2020  1255 BP recheck 104/62 [HW]     Clinical Course User Index [HW] Tobie Hellen C, PA-C    Hyperglycemia-glucose today 199, no ketones on UA, blood pressure is slightly lower than normal, checking blood work of BMP, CBC and A1c.  Will have patient focus on fluids, hydration, adherence to meds and follow-up with PCP as planned on Tuesday.  If any symptoms progressing or worsening, sugars elevating above 400 again to follow-up in the emergency room.   Discussed strict return precautions. Patient verbalized understanding and is agreeable with plan.  Final Clinical Impressions(s) / UC Diagnoses   Final diagnoses:  Hyperglycemia  Type 2 diabetes mellitus without complication, with long-term current use of insulin ([place])     Discharge Instructions      Blood sugar 199 today Drink plenty of fluids Continue insulin and xigduo We are checking blood work-contact PCP for further recommendations changing meds If developing increased dizziness, lightheadedness, nausea, vomiting, abdominal pain, or blood sugars greater than 400 please go to the emergency room     ED Prescriptions   None    PDMP not reviewed this encounter.   Janith Lima, PA-C 07/25/20 1327

## 2020-07-25 NOTE — ED Triage Notes (Signed)
Pt present elevated Sugar with urinary frequency and excessive thirsty. Pt states symptom started Wednesday morning.

## 2020-07-26 LAB — CBC
Hematocrit: 37.6 % (ref 34.0–46.6)
Hemoglobin: 12.5 g/dL (ref 11.1–15.9)
MCH: 25.6 pg — ABNORMAL LOW (ref 26.6–33.0)
MCHC: 33.2 g/dL (ref 31.5–35.7)
MCV: 77 fL — ABNORMAL LOW (ref 79–97)
Platelets: 190 10*3/uL (ref 150–450)
RBC: 4.89 x10E6/uL (ref 3.77–5.28)
RDW: 17.1 % — ABNORMAL HIGH (ref 11.7–15.4)
WBC: 5 10*3/uL (ref 3.4–10.8)

## 2020-07-26 LAB — BASIC METABOLIC PANEL
BUN/Creatinine Ratio: 8 — ABNORMAL LOW (ref 9–23)
BUN: 9 mg/dL (ref 6–24)
CO2: 22 mmol/L (ref 20–29)
Calcium: 9.9 mg/dL (ref 8.7–10.2)
Chloride: 100 mmol/L (ref 96–106)
Creatinine, Ser: 1.12 mg/dL — ABNORMAL HIGH (ref 0.57–1.00)
Glucose: 192 mg/dL — ABNORMAL HIGH (ref 65–99)
Potassium: 3.1 mmol/L — ABNORMAL LOW (ref 3.5–5.2)
Sodium: 139 mmol/L (ref 134–144)
eGFR: 59 mL/min/{1.73_m2} — ABNORMAL LOW (ref >59)

## 2020-07-26 LAB — HEMOGLOBIN A1C
Est. average glucose Bld gHb Est-mCnc: 235 mg/dL
Hgb A1c MFr Bld: 9.8 % — ABNORMAL HIGH (ref 4.8–5.6)

## 2020-07-29 ENCOUNTER — Ambulatory Visit: Payer: Federal, State, Local not specified - PPO | Admitting: Physician Assistant

## 2020-07-29 ENCOUNTER — Encounter: Payer: Self-pay | Admitting: Physician Assistant

## 2020-07-29 ENCOUNTER — Other Ambulatory Visit: Payer: Self-pay

## 2020-07-29 VITALS — BP 101/66 | HR 93 | Temp 98.6°F | Ht 61.0 in | Wt 193.2 lb

## 2020-07-29 DIAGNOSIS — E876 Hypokalemia: Secondary | ICD-10-CM | POA: Diagnosis not present

## 2020-07-29 DIAGNOSIS — I10 Essential (primary) hypertension: Secondary | ICD-10-CM

## 2020-07-29 DIAGNOSIS — E1169 Type 2 diabetes mellitus with other specified complication: Secondary | ICD-10-CM | POA: Diagnosis not present

## 2020-07-29 DIAGNOSIS — E785 Hyperlipidemia, unspecified: Secondary | ICD-10-CM

## 2020-07-29 DIAGNOSIS — R002 Palpitations: Secondary | ICD-10-CM

## 2020-07-29 DIAGNOSIS — Z794 Long term (current) use of insulin: Secondary | ICD-10-CM

## 2020-07-29 MED ORDER — HYDROCHLOROTHIAZIDE 12.5 MG PO TABS: 12.5000 mg | ORAL_TABLET | Freq: Every day | ORAL | 0 refills | Status: DC

## 2020-07-29 MED ORDER — XIGDUO XR 10-1000 MG PO TB24: 1.0000 | ORAL_TABLET | Freq: Every day | ORAL | 0 refills | Status: DC

## 2020-07-29 MED ORDER — GLIPIZIDE 5 MG PO TABS: 5.0000 mg | ORAL_TABLET | Freq: Every day | ORAL | 0 refills | Status: DC

## 2020-07-29 NOTE — Patient Instructions (Signed)
Diabetes Mellitus Emergency Preparedness Plan ?A diabetes emergency preparedness plan is a checklist to make sure you have everything you need to manage your diabetes in case of an emergency, such as an evacuation, natural disaster, national security emergency, or pandemic lockdown. ?Managing your diabetes is something you have to do all day every day. The American Diabetes Association and the American College of Endocrinology both recommend putting together an emergency diabetes kit. Your kit should include important information and documents as well as all the supplies you will need to manage your diabetes for at least 1 week. Store it in a portable, waterproof bag or container. The best time to start making your emergency kit is now. ?How to make your emergency kit ?Collect information and documents ?Include the following information and documents in your kit: ?The type of diabetes you have. ?A copy of your health insurance cards and photo ID. ?A list of all your other medical conditions, allergies, and surgeries. ?A list of all your medicines and doses with the contact information for your pharmacy. Ask your health care provider for a list of your current medicines. ?Any recent lab results, including your latest hemoglobin A1C (HbA1C). ?The make, model, and serial number of your insulin pump, if you use one. Also include contact information for the manufacturer. ?Contact information for people who should be notified in case of an emergency. Include your health care provider's name, address, and phone number. ?Collect diabetes care items ?Include the following diabetes care items in your kit: ?At least a 1-week supply of: ?Oral medicines. ?Insulin. ?Blood glucose testing supplies. These include testing strips, lancets, and extra batteries for your blood glucose monitor and pump. ?A charger for the continuous glucose monitor (CGM) receiver and pump. ?Any extra supplies needed for your CGM or pump. ?A supply of  glucagon, glucose tablets, juice, soda, or hard candy in case of hypoglycemia. ?Coolers or cold packs. ?A safe container for syringes, needles, and lancets. ? ?Other preparations ?Other things to consider doing as part of your emergency plan: ?Make sure that your mobile phone is charged and that you have an extra charger, cable, or batteries. ?Choose a meeting place for family members. ?Wear a medical alert or ID bracelet. ?If you have a child with diabetes, make sure your child's school has a copy of his or her emergency plan, including the name of the staff member who will assist your child. ?Where to find more information ?American Diabetes Association: www.diabetes.org ?Centers for Disease Control and Prevention: blogs.cdc.gov ?Summary ?A diabetes emergency preparedness plan is a checklist to make sure you have everything you need in case of an emergency. ?Your kit should include important information and documents as well as all the supplies you will need to manage your condition for at least 1 week. ?Store your kit in a portable, waterproof bag or container. ?The best time to start making your emergency kit is now. ?This information is not intended to replace advice given to you by your health care provider. Make sure you discuss any questions you have with your health care provider. ?Document Revised: 08/09/2019 Document Reviewed: 08/09/2019 ?Elsevier Patient Education ? 2022 Elsevier [employer]. ? ?

## 2020-07-29 NOTE — Progress Notes (Signed)
Established Patient Office Visit  Subjective:  Patient ID: Morgan [name], female    DOB: [birth year]  Age: 55 y.o. MRN: [id number]  CC:  Chief Complaint  Patient presents with   Diabetes   Hypertension   Hyperlipidemia    HPI Morgan [name] presents for follow up on diabetes mellitus, hypertension and hyperlipidemia.  Diabetes mellitus: Pt reports increased urination or thirst. Pt reports medication compliance. No hypoglycemic events. Checking glucose at home. States FBS continue to be elevated, it was 369 this morning. Patient denies diet changes such as increased sugar or carbohydrate intake. Denies increased stress. Patient was a no show for last chronic visit and was advised to go to [facility] or ED for evaluation last week when she called stating her glucose readings had been elevated in 400s. Patient did go to UC.  HTN: Pt denies new onset chest pain, palpitations, dizziness or lower extremity swelling. Patient followed by cardiology for palpitations and shortness of breath. Taking medication as directed without side effects. Has been checking BP at home and readings range 100-120/70-80. Pt states has been increasing her water intake.   HLD: Pt taking medication as directed without issues. Patient reports limits red meat and fried foods.    Past Medical History:  Diagnosis Date   Anxiety    Bipolar disorder ([place])    Breast mass    right   Cervical dysplasia    Diabetes mellitus without complication ([place])    Endometriosis    GERD (gastroesophageal reflux disease)    Hypercholesteremia    Hypertension    Migraine    Pelvic kidney    left   Postpartum depression    hx of    Past Surgical History:  Procedure Laterality Date   ABDOMINAL HYSTERECTOMY N/A    [employer]reesia 07/30/2019   BACK SURGERY  2014   BREAST LUMPECTOMY WITH RADIOACTIVE SEED LOCALIZATION Right 10/28/2014   Procedure: RIGHT BREAST LUMPECTOMY WITH RADIOACTIVE SEED LOCALIZATION;  Surgeon: [name], MD;   Location: [place];  Service: General;  Laterality: Right;   BREAST LUMPECTOMY WITH RADIOACTIVE SEED LOCALIZATION Right 06/18/2019   Procedure: RIGHT BREAST RADIOACTIVE SEED LOCALIZATION LUMPECTOMY;  Surgeon: Jovita Kussmaul, MD;  Location: [place];  Service: General;  Laterality: Right;   BREAST SURGERY N/A    Phreesia 07/30/2019   CESAREAN SECTION  03   COLONOSCOPY WITH PROPOFOL N/A 05/01/2018   Procedure: COLONOSCOPY WITH PROPOFOL;  Surgeon: [name], MD;  Location: [facility] ENDOSCOPY;  Service: Gastroenterology;  Laterality: N/A;   COLPOSCOPY     ESOPHAGOGASTRODUODENOSCOPY (EGD) WITH PROPOFOL N/A 05/01/2018   Procedure: ESOPHAGOGASTRODUODENOSCOPY (EGD) WITH PROPOFOL;  Surgeon: [name], MD;  Location: [facility] ENDOSCOPY;  Service: Gastroenterology;  Laterality: N/A;   ESOPHAGOGASTRODUODENOSCOPY (EGD) WITH PROPOFOL N/A 11/13/2018   Procedure: ESOPHAGOGASTRODUODENOSCOPY (EGD) WITH PROPOFOL;  Surgeon: [name], MD;  Location: [facility] ENDOSCOPY;  Service: Gastroenterology;  Laterality: N/A;   GYNECOLOGIC CRYOSURGERY     NECK SURGERY  2012   PELVIC LAPAROSCOPY  14    Family History  Problem Relation Age of Onset   Diabetes Mother    Breast cancer Mother 71   Thyroid disease Mother    Depression Mother    Hypertension Father    Heart disease Father    Heart attack Father 81       Two instances, first at age 40   Stroke Father    Alcohol abuse Father    Throat cancer Father  Diabetes Maternal Grandmother    Depression Maternal Grandmother    Uterine cancer Maternal Grandmother     Social History   Socioeconomic History   Marital status: Married    Spouse name: Not on file   Number of children: Not on file   Years of education: Not on file   Highest education level: Not on file  Occupational History   Not on file  Tobacco Use   Smoking status: Never   Smokeless tobacco: Never  Vaping Use   Vaping Use: Never used  Substance and Sexual  Activity   Alcohol use: No   Drug use: No   Sexual activity: Yes    Partners: Male    Birth control/protection: None    Comment: vasectomy  Other Topics Concern   Not on file  Social History Narrative   Not on file   Social Determinants of Health   Financial Resource Strain: Not on file  Food Insecurity: Not on file  Transportation Needs: Not on file  Physical Activity: Not on file  Stress: Not on file  Social Connections: Not on file  Intimate Partner Violence: Not on file    Outpatient Medications Prior to Visit  Medication Sig Dispense Refill   ARIPiprazole (ABILIFY) 15 MG tablet Take 15 mg by mouth at bedtime.     atorvastatin (LIPITOR) 40 MG tablet Take 1 tablet (40 mg total) by mouth daily. 90 tablet 0   butalbital-acetaminophen-caffeine (FIORICET) 50-325-40 MG tablet Take 1-2 tablets by mouth every 6 (six) hours as needed for headache. 20 tablet 0   clonazePAM (KLONOPIN) 1 MG tablet Take 0.5 mg by mouth daily as needed for anxiety.      cyclobenzaprine (FLEXERIL) 10 MG tablet Take 10 mg by mouth 3 (three) times daily as needed for muscle spasms.     DULoxetine (CYMBALTA) 60 MG capsule Take 60 mg by mouth at bedtime.  5   HYDROcodone-acetaminophen (NORCO/VICODIN) 5-325 MG tablet Take 1-2 tablets by mouth every 6 (six) hours as needed for moderate pain or severe pain. 10 tablet 0   insulin aspart (NOVOLOG) 100 UNIT/ML injection Inject 10 Units into the skin 3 (three) times daily before meals.     losartan (COZAAR) 100 MG tablet Take 1 tablet (100 mg total) by mouth daily. Take 1 tablet daily by mouth. 90 tablet 0   meclizine (ANTIVERT) 12.5 MG tablet Take 1 tablet (12.5 mg total) by mouth 2 (two) times daily as needed for dizziness. 30 tablet 0   omeprazole (PRILOSEC) 20 MG capsule Take 20 mg by mouth at bedtime.      hydrochlorothiazide (HYDRODIURIL) 12.5 MG tablet Take 1 tablet (12.5 mg total) by mouth daily. 30 tablet 0   XIGDUO XR 10-500 MG TB24 TAKE 1 TABLET BY MOUTH  EVERY DAY 90 tablet 0   No facility-administered medications prior to visit.    Allergies  Allergen Reactions   Clindamycin/Lincomycin Rash    Diffuse drug reaction eruption   Other Hives, Rash and Itching   Lamotrigine Rash    ROS Review of Systems A fourteen system review of systems was performed and found to be positive as per HPI.  Objective:    Physical Exam General:  Well Developed, well nourished, appropriate for stated age.  Neuro:  Alert and oriented,  extra-ocular muscles intact  HEENT:  Normocephalic, atraumatic, neck supple, no carotid bruits appreciated  Skin:  no gross rash, warm, pink. Cardiac:  RRR, S1 S2, no murmur  Respiratory:  ECTA B/L  w/o wheezing, Not using accessory muscles, speaking in full sentences- unlabored. Vascular:  Ext warm, no cyanosis apprec.; cap RF less 2 sec. No edema  Psych:  No HI/SI, judgement and insight good, Euthymic mood. Full Affect.  BP 101/66   Pulse 93   Temp 98.6 F (37 C)   Ht $R'5\' 1"'wB$  (1.549 m)   Wt 193 lb 3.2 oz (87.6 kg)   LMP 05/12/2011   SpO2 96%   BMI 36.50 kg/m  Wt Readings from Last 3 Encounters:  07/29/20 193 lb 3.2 oz (87.6 kg)  07/08/20 191 lb 3.2 oz (86.7 kg)  06/17/20 196 lb 4.8 oz (89 kg)     Health Maintenance Due  Topic Date Due   COVID-19 Vaccine (1) Never done   Pneumococcal Vaccine 17-11 Years old (1 - PCV) Never done   Zoster Vaccines- Shingrix (1 of 2) Never done   OPHTHALMOLOGY EXAM  03/04/2019    There are no preventive care reminders to display for this patient.  Lab Results  Component Value Date   TSH 2.750 06/04/2020   Lab Results  Component Value Date   WBC 5.0 07/25/2020   HGB 12.5 07/25/2020   HCT 37.6 07/25/2020   MCV 77 (L) 07/25/2020   PLT 190 07/25/2020   Lab Results  Component Value Date   NA 139 07/25/2020   K 3.1 (L) 07/25/2020   CO2 22 07/25/2020   GLUCOSE 192 (H) 07/25/2020   BUN 9 07/25/2020   CREATININE 1.12 (H) 07/25/2020   BILITOT 0.5 06/14/2020    ALKPHOS 101 06/14/2020   AST 33 06/14/2020   ALT 38 06/14/2020   PROT 6.2 (L) 06/14/2020   ALBUMIN 3.4 (L) 06/14/2020   CALCIUM 9.9 07/25/2020   ANIONGAP 9 06/14/2020   EGFR 59 (L) 07/25/2020   Lab Results  Component Value Date   CHOL 138 03/24/2020   Lab Results  Component Value Date   HDL 40 03/24/2020   Lab Results  Component Value Date   LDLCALC 73 03/24/2020   Lab Results  Component Value Date   TRIG 140 03/24/2020   Lab Results  Component Value Date   CHOLHDL 3.5 03/24/2020   Lab Results  Component Value Date   HGBA1C 9.8 (H) 07/25/2020      Assessment & Plan:   Problem List Items Addressed This Visit       Endocrine   Type 2 diabetes mellitus with other specified complication (HCC)   Relevant Medications   Dapagliflozin-metFORMIN HCl ER (XIGDUO XR) 11-998 MG TB24   glipiZIDE (GLUCOTROL) 5 MG tablet   Hyperlipidemia associated with type 2 diabetes mellitus (HCC)   Relevant Medications   Dapagliflozin-metFORMIN HCl ER (XIGDUO XR) 11-998 MG TB24   glipiZIDE (GLUCOTROL) 5 MG tablet   Other Visit Diagnoses     Essential hypertension    -  Primary   Relevant Medications   hydrochlorothiazide (HYDRODIURIL) 12.5 MG tablet   Low serum potassium       Relevant Orders   Comprehensive metabolic panel      Type 2 diabetes mellitus with other specified complication: -Reviewed urgent care note and labs. UA negative for ketones, A1c uncontrolled at 9.8 (6.9 four months ago)so will adjust treatment therapy by increasing Xigduo XR to 11-998 mg (recent eGFR 59) and start glipizide 5 mg for evening dose. Continue Novolog 10 units daily before meals. Advised patient to monitor for hypoglycemic events. Continue ambulatory glucose monitoring and advised to notify the clinic if FBS fail to  improve and consistently >200. Patient verbalized understanding. -Recommend to follow low carbohydrate and glucose diet. -If A1c fails to improve or worsen will consider additional  treatment adjustments. -Follow up in 3 months.  Essential hypertension: -Soft blood pressure today, pulse 93 bpm. Patient's BP at cardiology consult 130/86 (07/08/2020) and at urgent care 97/61 (07/25/2020). BP seems to fluctuate and will continue current medication regimen. Advised to continue monitoring BP at home and if BP is consistently <110/80 then recommend treatment adjustments by decreasing Losartan to 50 mg. Patient verbalized understanding. -Dehydration likely contributing to soft BP. Recommend to continue with increasing hydration. -Will repeat CMP to re-evaluate electrolytes (potassium) and renal function.   Hyperlipidemia associated with type 2 diabetes mellitus: -Last lipid panel wnl's, LDL 73 (goal <70). -Continue current medication regimen. Last ALT (06/14/20) normal, 38. -Will continue to monitor.  Palpitations: -Recommend to continue to follow-up with Cardiology. Patient is scheduled for echocardiogram. Recently completed Holter monitor and waiting results.   Meds ordered this encounter  Medications   Dapagliflozin-metFORMIN HCl ER (XIGDUO XR) 11-998 MG TB24    Sig: Take 1 tablet by mouth daily.    Dispense:  90 tablet    Refill:  0    Order Specific Question:   Supervising Provider    Answer:   Beatrice Lecher D [[id number]]   glipiZIDE (GLUCOTROL) 5 MG tablet    Sig: Take 1 tablet (5 mg total) by mouth daily before supper.    Dispense:  90 tablet    Refill:  0    Order Specific Question:   Supervising Provider    Answer:   Beatrice Lecher D [[id number]]   hydrochlorothiazide (HYDRODIURIL) 12.5 MG tablet    Sig: Take 1 tablet (12.5 mg total) by mouth daily.    Dispense:  90 tablet    Refill:  0    Order Specific Question:   Supervising Provider    Answer:   Beatrice Lecher D [[id number]]     Follow-up: Return in about 3 months (around 10/29/2020) for DM, HTN, HLD .   Note:  This note was prepared with assistance of Dragon voice recognition software. Occasional  wrong-word or sound-a-like substitutions may have occurred due to the inherent limitations of voice recognition software.  Lorrene Reid, PA-C

## 2020-07-30 LAB — COMPREHENSIVE METABOLIC PANEL
ALT: 47 IU/L — ABNORMAL HIGH (ref 0–32)
AST: 36 IU/L (ref 0–40)
Albumin/Globulin Ratio: 1.8 (ref 1.2–2.2)
Albumin: 4.1 g/dL (ref 3.8–4.9)
Alkaline Phosphatase: 120 IU/L (ref 44–121)
BUN/Creatinine Ratio: 16 (ref 9–23)
BUN: 9 mg/dL (ref 6–24)
Bilirubin Total: 0.5 mg/dL (ref 0.0–1.2)
CO2: 23 mmol/L (ref 20–29)
Calcium: 9.2 mg/dL (ref 8.7–10.2)
Chloride: 97 mmol/L (ref 96–106)
Creatinine, Ser: 0.56 mg/dL — ABNORMAL LOW (ref 0.57–1.00)
Globulin, Total: 2.3 g/dL (ref 1.5–4.5)
Glucose: 311 mg/dL — ABNORMAL HIGH (ref 65–99)
Potassium: 3.7 mmol/L (ref 3.5–5.2)
Sodium: 135 mmol/L (ref 134–144)
Total Protein: 6.4 g/dL (ref 6.0–8.5)
eGFR: 110 mL/min/{1.73_m2} (ref >59)

## 2020-08-06 ENCOUNTER — Ambulatory Visit (HOSPITAL_COMMUNITY): Payer: Federal, State, Local not specified - PPO | Attending: Physician Assistant

## 2020-08-06 ENCOUNTER — Other Ambulatory Visit: Payer: Self-pay

## 2020-08-06 DIAGNOSIS — R0602 Shortness of breath: Secondary | ICD-10-CM | POA: Insufficient documentation

## 2020-08-06 DIAGNOSIS — R002 Palpitations: Secondary | ICD-10-CM | POA: Diagnosis not present

## 2020-08-06 LAB — ECHOCARDIOGRAM COMPLETE
Area-P 1/2: 4.15 cm2
S' Lateral: 1.9 cm

## 2020-08-06 MED ORDER — PERFLUTREN LIPID MICROSPHERE
1.0000 mL | INTRAVENOUS | Status: AC | PRN
  Administered 2020-08-06: 2 mL via INTRAVENOUS

## 2020-08-08 ENCOUNTER — Telehealth: Payer: Self-pay | Admitting: Interventional Cardiology

## 2020-08-08 DIAGNOSIS — I480 Paroxysmal atrial fibrillation: Secondary | ICD-10-CM

## 2020-08-08 NOTE — Telephone Encounter (Signed)
Patient is returning call to discuss echo results. °

## 2020-08-08 NOTE — Telephone Encounter (Signed)
Pt reports that she thought results showed she had fluid on her heart.  I reviewed results with her and let her know that I did not see this in the report.  She also questioned what the plan is to address Afib.  Per Dr. Irish Lack result note she had rare Afib < 1%.  Will route to MD and RN to address plan of action.

## 2020-08-11 NOTE — Telephone Encounter (Signed)
I spoke with patient and reviewed echo results with her.  Patient notified that Dr Irish Lack would like her to see Dr Rayann Heman for afib seen on monitor.  Patient aware she will be called with appointment information.

## 2020-08-12 ENCOUNTER — Encounter (HOSPITAL_COMMUNITY): Payer: Self-pay

## 2020-08-20 ENCOUNTER — Other Ambulatory Visit: Payer: Self-pay | Admitting: Physician Assistant

## 2020-08-20 DIAGNOSIS — E785 Hyperlipidemia, unspecified: Secondary | ICD-10-CM

## 2020-08-22 ENCOUNTER — Other Ambulatory Visit: Payer: Self-pay | Admitting: Physician Assistant

## 2020-08-22 DIAGNOSIS — E1169 Type 2 diabetes mellitus with other specified complication: Secondary | ICD-10-CM

## 2020-08-24 ENCOUNTER — Emergency Department (HOSPITAL_COMMUNITY): Payer: Federal, State, Local not specified - PPO

## 2020-08-24 ENCOUNTER — Encounter (HOSPITAL_COMMUNITY): Payer: Self-pay | Admitting: Emergency Medicine

## 2020-08-24 ENCOUNTER — Other Ambulatory Visit: Payer: Self-pay

## 2020-08-24 ENCOUNTER — Emergency Department (HOSPITAL_COMMUNITY)
Admission: EM | Admit: 2020-08-24 | Discharge: 2020-08-24 | Disposition: A | Discharge status: Home / Self Care | Payer: Federal, State, Local not specified - PPO | Attending: Emergency Medicine | Admitting: Emergency Medicine

## 2020-08-24 DIAGNOSIS — R079 Chest pain, unspecified: Secondary | ICD-10-CM

## 2020-08-24 DIAGNOSIS — Z7984 Long term (current) use of oral hypoglycemic drugs: Secondary | ICD-10-CM | POA: Insufficient documentation

## 2020-08-24 DIAGNOSIS — Z794 Long term (current) use of insulin: Secondary | ICD-10-CM | POA: Insufficient documentation

## 2020-08-24 DIAGNOSIS — I10 Essential (primary) hypertension: Secondary | ICD-10-CM | POA: Diagnosis not present

## 2020-08-24 DIAGNOSIS — E1165 Type 2 diabetes mellitus with hyperglycemia: Secondary | ICD-10-CM | POA: Insufficient documentation

## 2020-08-24 DIAGNOSIS — Z79899 Other long term (current) drug therapy: Secondary | ICD-10-CM | POA: Diagnosis not present

## 2020-08-24 DIAGNOSIS — R739 Hyperglycemia, unspecified: Secondary | ICD-10-CM

## 2020-08-24 HISTORY — DX: Unspecified atrial fibrillation: I48.91

## 2020-08-24 LAB — CBC
HCT: 41.9 % (ref 36.0–46.0)
Hemoglobin: 13.3 g/dL (ref 12.0–15.0)
MCH: 25.8 pg — ABNORMAL LOW (ref 26.0–34.0)
MCHC: 31.7 g/dL (ref 30.0–36.0)
MCV: 81.2 fL (ref 80.0–100.0)
Platelets: 250 10*3/uL (ref 150–400)
RBC: 5.16 MIL/uL — ABNORMAL HIGH (ref 3.87–5.11)
RDW: 16.2 % — ABNORMAL HIGH (ref 11.5–15.5)
WBC: 9.1 10*3/uL (ref 4.0–10.5)
nRBC: 0 % (ref 0.0–0.2)

## 2020-08-24 LAB — BASIC METABOLIC PANEL
Anion gap: 11 (ref 5–15)
BUN: 10 mg/dL (ref 6–20)
CO2: 23 mmol/L (ref 22–32)
Calcium: 9.7 mg/dL (ref 8.9–10.3)
Chloride: 99 mmol/L (ref 98–111)
Creatinine, Ser: 0.74 mg/dL (ref 0.44–1.00)
GFR, Estimated: >60 mL/min (ref >60)
Glucose, Bld: 307 mg/dL — ABNORMAL HIGH (ref 70–99)
Potassium: 4 mmol/L (ref 3.5–5.1)
Sodium: 133 mmol/L — ABNORMAL LOW (ref 135–145)

## 2020-08-24 LAB — I-STAT BETA HCG BLOOD, ED (MC, WL, AP ONLY): I-stat hCG, quantitative: 6.8 m[IU]/mL — ABNORMAL HIGH (ref <5)

## 2020-08-24 LAB — TROPONIN I (HIGH SENSITIVITY)
Troponin I (High Sensitivity): 12 ng/L (ref <18)
Troponin I (High Sensitivity): 8 ng/L (ref <18)

## 2020-08-24 MED ORDER — KETOROLAC TROMETHAMINE 15 MG/ML IJ SOLN
15.0000 mg | Freq: Once | INTRAMUSCULAR | Status: AC
  Administered 2020-08-24: 15 mg via INTRAVENOUS
  Filled 2020-08-24: qty 1

## 2020-08-24 NOTE — Discharge Instructions (Addendum)
Your work-up in the emergency department was reassuring.  You may benefit from further outpatient evaluation with a stress test or cardiac CT.  Discuss your symptoms with your cardiologist as well as your primary care doctor to see if these additional studies may be indicated. Continue your prescribed medications. Have your doctor address your persistent hyperglycemia.  You may return for new or concerning symptoms.

## 2020-08-24 NOTE — ED Notes (Signed)
Pt alert and oriented, verbalized discharge instructions, has a family member picking her up.

## 2020-08-24 NOTE — ED Provider Notes (Signed)
[facility] EMERGENCY DEPARTMENT Provider Note   CSN: [id number] Arrival date & time: 08/24/20  1829     History No chief complaint on file.   Morgan [name] is a 55 y.o. female.  55 year old female with a history of migraine headaches, bipolar disorder, esophageal reflux, hypertension, diabetes, dyslipidemia presents to the emergency department for complaints of chest pain.  She describes a sharp, shooting chest pain waking her from sleep at 5 AM.  Her symptoms spontaneously improved after a few hours, but recurred in the afternoon.  Her pain in the afternoon has been described as a pressure and squeezing type of pain.  She notes some aggravation of her symptoms with raising her left arm.  It is not aggravated by exertion or when lying flat.  Has had some associated waxing/waning nausea as well as lightheadedness.  Her pain will radiate to her left shoulder and down her left arm.  Denies any radiation through to her back or to her abdomen.  No syncope, hemoptysis, leg swelling, vomiting.  Has had episodes of chest pain in the past, but states this feels different.  She has been followed by [facility] cardiology for chest pain symptoms.  Recently had a Holter monitor and echocardiogram completed.  Holter monitor was generally reassuring, but did show patient had rare episodes of A. fib <1% of her monitoring cycle.  No personal history of ACS.  She reports a history of heart disease in her father; none in her brothers.  The history is provided by the patient. No language interpreter was used.      Past Medical History:  Diagnosis Date   Anxiety    Atrial fibrillation ([place])    Bipolar disorder ([place])    Breast mass    right   Cervical dysplasia    Diabetes mellitus without complication ([place])    Endometriosis    GERD (gastroesophageal reflux disease)    Hypercholesteremia    Hypertension    Migraine    Pelvic kidney    left   Postpartum depression    hx of    Patient  Active Problem List   Diagnosis Date Noted   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   Local superficial swelling, mass or lump 04/29/2020   Shortness of breath 04/29/2020   Fatigue 04/29/2020   History of endometrial hyperplasia 08/12/2019   Fatty liver disease, nonalcoholic 67/61/9509   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   History of robot-assisted laparoscopic hysterectomy 07/25/2019   Body mass index (BMI) 33.0-33.9, adult 05/11/2019   Cervical dysplasia 04/18/2019   Diabetes type 2, uncontrolled ([place]) 04/18/2019   High cholesterol 04/18/2019   Other chronic pain 03/16/2019   Lumbago with sciatica, right side 03/16/2019   Degeneration of lumbar intervertebral disc 03/16/2019   Chronic low back pain 03/16/2019   Chest pain 06/14/2017   Hypertension complicating diabetes ([place]) 06/14/2017   Type 2 diabetes mellitus with other specified complication ([place]) 32/67/1245   Hyperlipidemia associated with type 2 diabetes mellitus ([place]) 06/14/2017   Displacement of lumbar intervertebral disc without myelopathy 09/20/2012   Bipolar disorder ([place])     Past Surgical History:  Procedure Laterality Date   ABDOMINAL HYSTERECTOMY N/A    Phreesia 07/30/2019   BACK SURGERY  2014   BREAST LUMPECTOMY WITH RADIOACTIVE SEED LOCALIZATION Right 10/28/2014   Procedure: RIGHT BREAST LUMPECTOMY WITH RADIOACTIVE SEED LOCALIZATION;  Surgeon: [name], MD;  Location: [place];  Service: General;  Laterality: Right;   BREAST  LUMPECTOMY WITH RADIOACTIVE SEED LOCALIZATION Right 06/18/2019   Procedure: RIGHT BREAST RADIOACTIVE SEED LOCALIZATION LUMPECTOMY;  Surgeon: Jovita Kussmaul, MD;  Location: [place];  Service: General;  Laterality: Right;   BREAST SURGERY N/A    Phreesia 07/30/2019   CESAREAN SECTION  03   COLONOSCOPY WITH PROPOFOL N/A 05/01/2018   Procedure: COLONOSCOPY WITH PROPOFOL;  Surgeon: [name], MD;  Location: [facility] ENDOSCOPY;  Service:  Gastroenterology;  Laterality: N/A;   COLPOSCOPY     ESOPHAGOGASTRODUODENOSCOPY (EGD) WITH PROPOFOL N/A 05/01/2018   Procedure: ESOPHAGOGASTRODUODENOSCOPY (EGD) WITH PROPOFOL;  Surgeon: [name], MD;  Location: [facility] ENDOSCOPY;  Service: Gastroenterology;  Laterality: N/A;   ESOPHAGOGASTRODUODENOSCOPY (EGD) WITH PROPOFOL N/A 11/13/2018   Procedure: ESOPHAGOGASTRODUODENOSCOPY (EGD) WITH PROPOFOL;  Surgeon: [name], MD;  Location: [facility] END[facility]COPY;  Service: Gastroenterology;  Laterality: N/A;   GYNECOLOGIC CRYOSURGERY     NECK SURGERY  2012   PELVIC LAPAROSCOPY  69     OB History     Gravida  1   Para  1   Term  1   Preterm      AB  0   Living  1      SAB      IAB      Ectopic  0   Multiple      Live Births              Family History  Problem Relation Age of Onset   Diabetes Mother    Breast cancer Mother 38   Thyroid disease Mother    Depression Mother    Hypertension Father    Heart disease Father    Heart attack Father 77       Two instances, first at age 38   Stroke Father    Alcohol abuse Father    Throat cancer Father    Diabetes Maternal Grandmother    Depression Maternal Grandmother    Uterine cancer Maternal Grandmother     Social History   Tobacco Use   Smoking status: Never   Smokeless tobacco: Never  Vaping Use   Vaping Use: Never used  Substance Use Topics   Alcohol use: No   Drug use: No    Home Medications Prior to Admission medications   Medication Sig Start Date End Date Taking? Authorizing Provider  ARIPiprazole (ABILIFY) 15 MG tablet Take 15 mg by mouth at bedtime. 12/21/17  Yes Noemi Chapel, NP  atorvastatin (LIPITOR) 40 MG tablet Take 1 tablet (40 mg total) by mouth daily. 04/14/20  Yes Abonza, Maritza, PA-C  butalbital-acetaminophen-caffeine (FIORICET) 50-325-40 MG tablet Take 1-2 tablets by mouth every 6 (six) hours as needed for headache. 11/16/19 11/15/20 Yes Harris, Abigail, PA-C  clonazePAM (KLONOPIN) 1 MG tablet  Take 1 mg by mouth daily as needed for anxiety. 07/09/19  Yes [provider]  cyclobenzaprine (FLEXERIL) 10 MG tablet Take 10 mg by mouth 3 (three) times daily as needed for muscle spasms.   Yes [provider]  Dapagliflozin-metFORMIN HCl ER (XIGDUO XR) 11-998 MG TB24 Take 1 tablet by mouth daily. 07/29/20  Yes Abonza, Maritza, PA-C  DULoxetine (CYMBALTA) 60 MG capsule Take 60 mg by mouth at bedtime. 11/04/16  Yes [provider]  glipiZIDE (GLUCOTROL) 5 MG tablet Take 1 tablet (5 mg total) by mouth daily before supper. 07/29/20  Yes Abonza, Maritza, PA-C  hydrochlorothiazide (HYDRODIURIL) 12.5 MG tablet Take 1 tablet (12.5 mg total) by mouth daily. 07/29/20  Yes Lorrene Reid, PA-C  HYDROcodone-acetaminophen (NORCO/VICODIN) 5-325 MG tablet Take 1-2 tablets by mouth every 6 (six) hours as needed for moderate pain or severe pain. 06/18/19  Yes [name], MD  ibuprofen (ADVIL) 200 MG tablet Take 400 mg by mouth every 6 (six) hours as needed for mild pain.   Yes [provider]  insulin aspart (NOVOLOG) 100 UNIT/ML injection Inject 10 Units into the skin 3 (three) times daily before meals.   Yes [provider]  losartan (COZAAR) 100 MG tablet Take 1 tablet (100 mg total) by mouth daily. Take 1 tablet daily by mouth. Patient taking differently: Take 100 mg by mouth daily. 06/25/20  Yes Abonza, Maritza, PA-C  Naproxen Sod-diphenhydrAMINE (ALEVE PM) 220-25 MG TABS Take 2 tablets by mouth daily as needed (pain).   Yes [provider]  omeprazole (PRILOSEC) 20 MG capsule Take 20 mg by mouth at bedtime.    Yes [provider]  meclizine (ANTIVERT) 12.5 MG tablet Take 1 tablet (12.5 mg total) by mouth 2 (two) times daily as needed for dizziness. Patient not taking: Reported on 08/24/2020 06/04/20   Lorrene Reid, PA-C    Allergies    Clindamycin/lincomycin, Other, and Lamotrigine  Review of Systems   Review of Systems Ten systems reviewed  and are negative for acute change, except as noted in the HPI.    Physical Exam Updated Vital Signs BP 115/65   Pulse 90   Temp 98.2 F (36.8 C) (Oral)   Resp 16   LMP 05/12/2011   SpO2 97%   Physical Exam Vitals and nursing note reviewed.  Constitutional:      General: She is not in acute distress.    Appearance: She is well-developed. She is not diaphoretic.     Comments: Nontoxic-appearing and in no acute distress  HENT:     Head: Normocephalic and atraumatic.     Right Ear: External ear normal.     Left Ear: External ear normal.  Eyes:     General: No scleral icterus.    Conjunctiva/sclera: Conjunctivae normal.  Cardiovascular:     Rate and Rhythm: Normal rate and regular rhythm.     Pulses: Normal pulses.  Pulmonary:     Effort: Pulmonary effort is normal. No respiratory distress.     Breath sounds: No stridor. No wheezing.     Comments: Lungs clear to auscultation bilaterally Musculoskeletal:        General: Normal range of motion.     Cervical back: Normal range of motion.     Comments: No lower extremity edema  Skin:    General: Skin is warm and dry.     Coloration: Skin is not pale.     Findings: No erythema or rash.  Neurological:     Mental Status: She is alert and oriented to person, place, and time.     Coordination: Coordination normal.  Psychiatric:        Behavior: Behavior normal.    ED Results / Procedures / Treatments   Labs (all labs ordered are listed, but only abnormal results are displayed) Labs Reviewed  BASIC METABOLIC PANEL - Abnormal; Notable for the following components:      Result Value   Sodium 133 (*)    Glucose, Bld 307 (*)    All other components within normal limits  CBC - Abnormal; Notable for the following components:   RBC 5.16 (*)    MCH 25.8 (*)    RDW 16.2 (*)    All other  components within normal limits  I-STAT BETA HCG BLOOD, ED (MC, WL, AP ONLY) - Abnormal; Notable for the following components:   I-stat hCG,  quantitative 6.8 (*)    All other components within normal limits  TROPONIN I (HIGH SENSITIVITY)  TROPONIN I (HIGH SENSITIVITY)    EKG EKG Interpretation  Date/Time:  Sunday August 24 2020 18:37:12 EDT Ventricular Rate:  95 PR Interval:  146 QRS Duration: 84 QT Interval:  338 QTC Calculation: 424 R Axis:   66 Text Interpretation: Normal sinus rhythm Confirmed by Randal Buba, April ([id number]) on 08/24/2020 11:15:08 PM  Radiology DG Chest 2 View  Result Date: 08/24/2020 CLINICAL DATA:  Chest pain EXAM: CHEST - 2 VIEW COMPARISON:  June 14, 2020. FINDINGS: The heart size and mediastinal contours are within normal limits. No focal consolidation. No pleural effusion. No pneumothorax. Cervical fixation hardware. IMPRESSION: No active cardiopulmonary disease. Electronically Signed   By: Dahlia Bailiff MD   On: 08/24/2020 19:36    Procedures Procedures   Medications Ordered in ED Medications  ketorolac (TORADOL) 15 MG/ML injection 15 mg (15 mg Intravenous Given 08/24/20 2340)    ED Course  I have reviewed the triage vital signs and the nursing notes.  Pertinent labs & imaging results that were available during my care of the patient were reviewed by me and considered in my medical decision making (see chart for details).  Clinical Course as of 08/25/20 0630  Sun Aug 24, 2020  2320 Well's PE score is 0 c/w low risk of PE. [KH]  2321 Repeat troponin remains normal, down trending. [KH]  2323 Heart score is 4. Given cardiac work up, troponin levels, HEART pathway considerations - feel patient is appropriate for close outpatient follow up. She is already established with [facility]. [KH]    Clinical Course User Index [KH] Antonietta Breach, PA-C   MDM Rules/Calculators/A&P                          Patient presents to the emergency department for evaluation of chest pain.  Low suspicion for emergent cardiac etiology given reassuring workup today.  EKG is nonischemic and troponin negative  x2.  Chest x-ray without evidence of mediastinal widening to suggest dissection.  No pneumothorax, pneumonia, pleural effusion.  Pulmonary embolus further considered; however, patient without tachycardia, tachypnea, dyspnea, hypoxia.    Have advised that the patient continue follow-up with her primary care doctor as well as her established cardiologist.  Currently, her next scheduled cardiology appointment is in 1 month.  May benefit from further outpatient evaluation with a stress test and/or cardiac CT.  Patient encouraged to return for new or concerning symptoms.  Discharged in stable condition with no unaddressed concerns.   Final Clinical Impression(s) / ED Diagnoses Final diagnoses:  Chest pain, unspecified type  Hyperglycemia    Rx / DC Orders ED Discharge Orders     None        Antonietta Breach, PA-C 08/25/20 [place], April, MD 08/25/20 [phone]

## 2020-08-24 NOTE — ED Triage Notes (Signed)
Pt to triage via [facility] from home.  Reports squeezing L sided chest pain with SOB and nausea since 5am .  History of afib.  CBG 425.  20g LAC, 324 ASA, and 1 NTG with no changes.  NS 400cc given PTA.

## 2020-08-25 ENCOUNTER — Ambulatory Visit
Admission: RE | Admit: 2020-08-25 | Discharge: 2020-08-25 | Disposition: A | Discharge status: Home / Self Care | Payer: Federal, State, Local not specified - PPO | Source: Ambulatory Visit | Attending: General Surgery | Admitting: General Surgery

## 2020-08-25 DIAGNOSIS — Z9889 Other specified postprocedural states: Secondary | ICD-10-CM

## 2020-08-26 ENCOUNTER — Other Ambulatory Visit: Payer: Self-pay | Admitting: General Surgery

## 2020-08-26 ENCOUNTER — Telehealth: Payer: Self-pay | Admitting: Physician Assistant

## 2020-08-26 DIAGNOSIS — Z794 Long term (current) use of insulin: Secondary | ICD-10-CM

## 2020-08-26 DIAGNOSIS — E1169 Type 2 diabetes mellitus with other specified complication: Secondary | ICD-10-CM

## 2020-08-26 MED ORDER — INSULIN ASPART 100 UNIT/ML IJ SOLN: 10.0000 [IU] | Freq: Three times a day (TID) | INTRAMUSCULAR | 1 refills | Status: DC

## 2020-08-26 NOTE — Telephone Encounter (Signed)
Patient states her refill on her Novolog was denied and she would like a refill. We have not prescribed it. Patient uses [facility], thanks.

## 2020-08-26 NOTE — Addendum Note (Signed)
Addended by: Mickel Crow on: 08/26/2020 11:16 AM   Modules accepted: Orders

## 2020-08-26 NOTE — Telephone Encounter (Signed)
Per Herb Grays patient is to continue taking the Novolog. Refill sent to pharmacy. AS, CMA

## 2020-08-28 ENCOUNTER — Other Ambulatory Visit: Payer: Self-pay | Admitting: Physician Assistant

## 2020-08-28 ENCOUNTER — Telehealth: Payer: Self-pay | Admitting: Physician Assistant

## 2020-08-28 DIAGNOSIS — E1169 Type 2 diabetes mellitus with other specified complication: Secondary | ICD-10-CM

## 2020-08-28 DIAGNOSIS — E785 Hyperlipidemia, unspecified: Secondary | ICD-10-CM

## 2020-08-28 NOTE — Telephone Encounter (Signed)
Patient requesting predrawn pens rather than vial. Verbal order given to Colletta Maryland at [place]. AS, CMA

## 2020-08-28 NOTE — Telephone Encounter (Signed)
Patient needs her Novolog insulin pen sent to [facility], patient states wrong prescription was sent in. Please advise, thanks.

## 2020-09-19 ENCOUNTER — Encounter: Payer: Self-pay | Admitting: Emergency Medicine

## 2020-09-19 ENCOUNTER — Other Ambulatory Visit: Payer: Self-pay

## 2020-09-19 ENCOUNTER — Ambulatory Visit
Admission: EM | Admit: 2020-09-19 | Discharge: 2020-09-19 | Disposition: A | Discharge status: Home / Self Care | Payer: Federal, State, Local not specified - PPO | Attending: Urgent Care | Admitting: Urgent Care

## 2020-09-19 DIAGNOSIS — Z20822 Contact with and (suspected) exposure to covid-19: Secondary | ICD-10-CM | POA: Insufficient documentation

## 2020-09-19 DIAGNOSIS — U071 COVID-19: Secondary | ICD-10-CM | POA: Diagnosis not present

## 2020-09-19 DIAGNOSIS — R0602 Shortness of breath: Secondary | ICD-10-CM | POA: Insufficient documentation

## 2020-09-19 DIAGNOSIS — R0789 Other chest pain: Secondary | ICD-10-CM | POA: Insufficient documentation

## 2020-09-19 DIAGNOSIS — R059 Cough, unspecified: Secondary | ICD-10-CM | POA: Insufficient documentation

## 2020-09-19 DIAGNOSIS — E1165 Type 2 diabetes mellitus with hyperglycemia: Secondary | ICD-10-CM | POA: Insufficient documentation

## 2020-09-19 MED ORDER — BENZONATATE 100 MG PO CAPS: 100.0000 mg | ORAL_CAPSULE | Freq: Three times a day (TID) | ORAL | 0 refills | Status: DC | PRN

## 2020-09-19 MED ORDER — PROMETHAZINE-DM 6.25-15 MG/5ML PO SYRP: 5.0000 mL | ORAL_SOLUTION | Freq: Every evening | ORAL | 0 refills | Status: DC | PRN

## 2020-09-19 NOTE — ED Provider Notes (Addendum)
[facility]   MRN: [id number] DOB: 03/28/1965  Subjective:   Morgan [name] is a 55 y.o. female presenting for 6-day history of acute onset persistent malaise, fatigue, cough, nausea and intermittent vomiting with diarrhea.  Had 2 exposure to COVID-19 with her husband and son.  Has not had COVID vaccination. Has Type 2 diabetes treated with insulin.  His uncontrolled, noncompliant with her diet.  Denies chest pain, shortness of breath, body aches.  No history of respiratory disorders.  No current facility-administered medications for this encounter.  Current Outpatient Medications:    ARIPiprazole (ABILIFY) 15 MG tablet, Take 15 mg by mouth at bedtime., Disp: , Rfl:    atorvastatin (LIPITOR) 40 MG tablet, Take 1 tablet (40 mg total) by mouth daily., Disp: 90 tablet, Rfl: 0   butalbital-acetaminophen-caffeine (FIORICET) 50-325-40 MG tablet, Take 1-2 tablets by mouth every 6 (six) hours as needed for headache., Disp: 20 tablet, Rfl: 0   clonazePAM (KLONOPIN) 1 MG tablet, Take 1 mg by mouth daily as needed for anxiety., Disp: , Rfl:    cyclobenzaprine (FLEXERIL) 10 MG tablet, Take 10 mg by mouth 3 (three) times daily as needed for muscle spasms., Disp: , Rfl:    Dapagliflozin-metFORMIN HCl ER (XIGDUO XR) 11-998 MG TB24, Take 1 tablet by mouth daily., Disp: 90 tablet, Rfl: 0   DULoxetine (CYMBALTA) 60 MG capsule, Take 60 mg by mouth at bedtime., Disp: , Rfl: 5   glipiZIDE (GLUCOTROL) 5 MG tablet, Take 1 tablet (5 mg total) by mouth daily before supper., Disp: 90 tablet, Rfl: 0   hydrochlorothiazide (HYDRODIURIL) 12.5 MG tablet, Take 1 tablet (12.5 mg total) by mouth daily., Disp: 90 tablet, Rfl: 0   HYDROcodone-acetaminophen (NORCO/VICODIN) 5-325 MG tablet, Take 1-2 tablets by mouth every 6 (six) hours as needed for moderate pain or severe pain., Disp: 10 tablet, Rfl: 0   ibuprofen (ADVIL) 200 MG tablet, Take 400 mg by mouth every 6 (six) hours as needed for mild pain., Disp: ,  Rfl:    insulin aspart (NOVOLOG) 100 UNIT/ML injection, Inject 10 Units into the skin 3 (three) times daily before meals., Disp: 10 mL, Rfl: 1   losartan (COZAAR) 100 MG tablet, Take 1 tablet (100 mg total) by mouth daily. Take 1 tablet daily by mouth. (Patient taking differently: Take 100 mg by mouth daily.), Disp: 90 tablet, Rfl: 0   meclizine (ANTIVERT) 12.5 MG tablet, Take 1 tablet (12.5 mg total) by mouth 2 (two) times daily as needed for dizziness. (Patient not taking: Reported on 08/24/2020), Disp: 30 tablet, Rfl: 0   Naproxen Sod-diphenhydrAMINE (ALEVE PM) 220-25 MG TABS, Take 2 tablets by mouth daily as needed (pain)., Disp: , Rfl:    omeprazole (PRILOSEC) 20 MG capsule, Take 20 mg by mouth at bedtime. , Disp: , Rfl:    Allergies  Allergen Reactions   Clindamycin/Lincomycin Rash    Diffuse drug reaction eruption   Other Hives, Rash and Itching   Lamotrigine Rash    Past Medical History:  Diagnosis Date   Anxiety    Atrial fibrillation ([facility])    Bipolar disorder ([place])    Breast mass    right   Cancer ([place]) 09/16/2019   ovarian ca no radiation no chemo   Cervical dysplasia    Diabetes mellitus without complication ([facility])    Endometriosis    GERD (gastroesophageal reflux disease)    Hypercholesteremia    Hypertension    Migraine    Pelvic kidney    left  Postpartum depression    hx of     Past Surgical History:  Procedure Laterality Date   ABDOMINAL HYSTERECTOMY N/A    Phreesia 07/30/2019   BACK SURGERY  2014   BREAST LUMPECTOMY WITH RADIOACTIVE SEED LOCALIZATION Right 10/28/2014   Procedure: RIGHT BREAST LUMPECTOMY WITH RADIOACTIVE SEED LOCALIZATION;  Surgeon: [name], MD;  Location: [place];  Service: General;  Laterality: Right;   BREAST LUMPECTOMY WITH RADIOACTIVE SEED LOCALIZATION Right 06/18/2019   Procedure: RIGHT BREAST RADIOACTIVE SEED LOCALIZATION LUMPECTOMY;  Surgeon: Jovita Kussmaul, MD;  Location: [place];  Service:  General;  Laterality: Right;   BREAST SURGERY N/A    Phreesia 07/30/2019   CESAREAN SECTION  03   COLONOSCOPY WITH PROPOFOL N/A 05/01/2018   Procedure: COLONOSCOPY WITH PROPOFOL;  Surgeon: [name], MD;  Location: [facility] ENDOSCOPY;  Service: Gastroenterology;  Laterality: N/A;   COLPOSCOPY     ESOPHAGOGASTRODUODENOSCOPY (EGD) WITH PROPOFOL N/A 05/01/2018   Procedure: ESOPHAGOGASTRODUODENOSCOPY (EGD) WITH PROPOFOL;  Surgeon: [name], MD;  Location: [facility] ENDOSCOPY;  Service: Gastroenterology;  Laterality: N/A;   ESOPHAGOGASTRODUODENOSCOPY (EGD) WITH PROPOFOL N/A 11/13/2018   Procedure: ESOPHAGOGASTRODUODENOSCOPY (EGD) WITH PROPOFOL;  Surgeon: [name], MD;  Location: [facility] ENDOSCOPY;  Service: Gastroenterology;  Laterality: N/A;   GYNECOLOGIC CRYOSURGERY     NECK SURGERY  2012   PELVIC LAPAROSCOPY  79    Family History  Problem Relation Age of Onset   Diabetes Mother    Breast cancer Mother 11   Thyroid disease Mother    Depression Mother    Hypertension Father    Heart disease Father    Heart attack Father 27       Two instances, first at age 40   Stroke Father    Alcohol abuse Father    Throat cancer Father    Diabetes Maternal Grandmother    Depression Maternal Grandmother    Uterine cancer Maternal Grandmother     Social History   Tobacco Use   Smoking status: Never   Smokeless tobacco: Never  Vaping Use   Vaping Use: Never used  Substance Use Topics   Alcohol use: No   Drug use: No    ROS   Objective:   Vitals: BP 125/75 (BP Location: Left Arm)   Pulse 100   Temp 97.9 F (36.6 C) (Oral)   Resp (!) 22   LMP 05/12/2011   SpO2 100%   Physical Exam Constitutional:      General: She is not in acute distress.    Appearance: Normal appearance. She is well-developed. She is obese. She is not ill-appearing, toxic-appearing or diaphoretic.  HENT:     Head: Normocephalic and atraumatic.     Nose: Nose normal.     Mouth/Throat:     Mouth: Mucous  membranes are moist.  Eyes:     Extraocular Movements: Extraocular movements intact.     Pupils: Pupils are equal, round, and reactive to light.  Cardiovascular:     Rate and Rhythm: Normal rate and regular rhythm.     Pulses: Normal pulses.     Heart sounds: Normal heart sounds. No murmur heard.   No friction rub. No gallop.  Pulmonary:     Effort: Pulmonary effort is normal. No respiratory distress.     Breath sounds: Normal breath sounds. No stridor. No wheezing, rhonchi or rales.  Skin:    General: Skin is warm and dry.     Findings: No rash.  Neurological:  Mental Status: She is alert and oriented to person, place, and time.  Psychiatric:        Mood and Affect: Mood normal.        Behavior: Behavior normal.        Thought Content: Thought content normal.    ED ECG REPORT   Date: 09/19/2020  EKG Time: 8:02 PM  Rate: 97 bpm  Rhythm: normal sinus rhythm  Axis: Normal  Intervals:none  ST&T Change: Nonspecific T wave flattening in lead aVL  Narrative Interpretation: Sinus rhythm at 97 bpm with nonspecific T wave changes above, no acute findings.     Assessment and Plan :   PDMP not reviewed this encounter.  1. Clinical diagnosis of COVID-19   2. Encounter for screening laboratory testing for COVID-19 virus   3. Close exposure to COVID-19 virus   4. Cough   5. Atypical chest pain   6. Shortness of breath   7. Uncontrolled type 2 diabetes mellitus with hyperglycemia ([place])     Unfortunately the patient tested positive for COVID-19, she is past the window where COVID antivirals will help.  Will manage for viral illness such as viral URI, viral syndrome, viral rhinitis, COVID-19. Counseled patient on nature of COVID-19 including modes of transmission, diagnostic testing, management and supportive care.  Offered scripts for symptomatic relief. COVID 19 testing is pending. Counseled patient on potential for adverse effects with medications prescribed/recommended today, ER  and return-to-clinic precautions discussed, patient verbalized understanding.      Jaynee Eagles, PA-C 09/19/20 2003

## 2020-09-19 NOTE — Discharge Instructions (Addendum)

## 2020-09-19 NOTE — ED Triage Notes (Signed)
Husband and son are covid positive. Saturday began feeling bad. C/o cough, nausea, vomiting, diarrhea over the last week. States over the last couple days began feeling heart racing, SOB, and chest pain.

## 2020-09-20 LAB — SARS-COV-2, NAA 2 DAY TAT

## 2020-09-20 LAB — NOVEL CORONAVIRUS, NAA: SARS-CoV-2, NAA: DETECTED — AB

## 2020-09-24 ENCOUNTER — Other Ambulatory Visit: Payer: Self-pay | Admitting: Physician Assistant

## 2020-09-24 ENCOUNTER — Institutional Professional Consult (permissible substitution): Payer: Federal, State, Local not specified - PPO | Admitting: Internal Medicine

## 2020-09-24 MED ORDER — ONDANSETRON HCL 4 MG PO TABS: 4.0000 mg | ORAL_TABLET | Freq: Three times a day (TID) | ORAL | 0 refills | Status: DC | PRN

## 2020-09-24 NOTE — Telephone Encounter (Signed)
Patient called office stating she is getting over covid but is having a lot of nausea. Requesting medication to help with nausea.    Per Herb Grays sending in Zofran '4mg'$  one tab every 8 hrs prn. AS, CMA

## 2020-10-29 ENCOUNTER — Ambulatory Visit: Payer: Federal, State, Local not specified - PPO | Admitting: Physician Assistant

## 2020-11-03 ENCOUNTER — Telehealth (HOSPITAL_COMMUNITY): Payer: Self-pay | Admitting: Nurse Practitioner

## 2020-11-03 ENCOUNTER — Encounter: Payer: Self-pay | Admitting: Internal Medicine

## 2020-11-03 ENCOUNTER — Ambulatory Visit: Payer: Federal, State, Local not specified - PPO | Admitting: Internal Medicine

## 2020-11-03 ENCOUNTER — Other Ambulatory Visit: Payer: Self-pay

## 2020-11-03 VITALS — BP 104/60 | HR 112 | Ht 61.0 in | Wt 193.0 lb

## 2020-11-03 DIAGNOSIS — I480 Paroxysmal atrial fibrillation: Secondary | ICD-10-CM

## 2020-11-03 DIAGNOSIS — I10 Essential (primary) hypertension: Secondary | ICD-10-CM | POA: Diagnosis not present

## 2020-11-03 DIAGNOSIS — D6869 Other thrombophilia: Secondary | ICD-10-CM | POA: Diagnosis not present

## 2020-11-03 MED ORDER — APIXABAN 5 MG PO TABS: 5.0000 mg | ORAL_TABLET | Freq: Two times a day (BID) | ORAL | 3 refills | Status: DC

## 2020-11-03 MED ORDER — DILTIAZEM HCL ER COATED BEADS 120 MG PO CP24: 120.0000 mg | ORAL_CAPSULE | Freq: Every day | ORAL | 3 refills | Status: DC

## 2020-11-03 NOTE — Patient Instructions (Addendum)
Medication Instructions:  Start Eliquis 5 mg two times daily  Start Diltiazem 120 mg daily Stop Hydrochlorothiazide  Your physician recommends that you continue on your current medications as directed. Please refer to the Current Medication list given to you today.  Labwork: None ordered.  Testing/Procedures: None ordered.  Follow-Up: Your physician wants you to follow-up in: 6 weeks with the [facility]. They will contact you to schedule.    Any Other Special Instructions Will Be Listed Below (If Applicable).  If you need a refill on your cardiac medications before your next appointment, please call your pharmacy.

## 2020-11-03 NOTE — Telephone Encounter (Signed)
Called and left message for patient to call back.  Per Saddie Benders, RN pt needs 6 week follow up in [facility] after starting diltiazem.

## 2020-11-03 NOTE — Progress Notes (Signed)
Electrophysiology Office Note   Date:  11/03/2020   ID:  Morgan [name], DOB 1965-06-29, MRN [id number]  PCP:  Lorrene Reid, PA-C  Cardiologist:  Dr Irish Lack Primary Electrophysiologist: Thompson Grayer, MD    CC: palpitations   History of Present Illness: Morgan [name] is a 55 y.o. female who presents today for electrophysiology evaluation.   She is referred by Dr Irish Lack for EP consultation regarding palpitations. She was seen by him 07/08/20 and complained of intermittent palpitations.  She had a zio monitor placed.  This recorded rare PACs, PVCs as well as afib with burden of <1%.  The longest event was 49 min and 13 seconds.  There was also a more regular arrhythmia which was likely atrial flutter. She has occasional palpitations.  + atypical chest pain.  Not very active. Today, she denies symptoms of shortness of breath, orthopnea, PND, lower extremity edema, claudication, dizziness, presyncope, syncope, bleeding, or neurologic sequela. The patient is tolerating medications without difficulties and is otherwise without complaint today.    Past Medical History:  Diagnosis Date   Anxiety    Atrial fibrillation ([place])    Bipolar disorder ([place])    Breast mass    right, removed and benign   Cancer ([place]) 09/16/2019   ovarian ca no radiation no chemo   Cervical dysplasia    Diabetes mellitus without complication (H[facility])    Endometriosis    GERD (gastroesophageal reflux disease)    Hypercholesteremia    Hypertension    Migraine    Pelvic kidney    left   Postpartum depression    hx of   Past Surgical History:  Procedure Laterality Date   ABDOMINAL HYSTERECTOMY N/A    Phreesia 07/30/2019   BACK SURGERY  2014   BREAST LUMPECTOMY WITH RADIOACTIVE SEED LOCALIZATION Right 10/28/2014   Procedure: RIGHT BREAST LUMPECTOMY WITH RADIOACTIVE SEED LOCALIZATION;  Surgeon: [name], MD;  Location: [facility];  Service: General;  Laterality: Right;   BREAST  LUMPECTOMY WITH RADIOACTIVE SEED LOCALIZATION Right 06/18/2019   Procedure: RIGHT BREAST RADIOACTIVE SEED LOCALIZATION LUMPECTOMY;  Surgeon: Jovita Kussmaul, MD;  Location: [facility];  Service: General;  Laterality: Right;   BREAST SURGERY N/A    Phreesia 07/30/2019   CESAREAN SECTION  03   COLONOSCOPY WITH PROPOFOL N/A 05/01/2018   Procedure: COLONOSCOPY WITH PROPOFOL;  Surgeon: [name], MD;  Location: [facility] ENDOSCOPY;  Service: Gastroenterology;  Laterality: N/A;   COLPOSCOPY     ESOPHAGOGASTRODUODENOSCOPY (EGD) WITH PROPOFOL N/A 05/01/2018   Procedure: ESOPHAGOGASTRODUODENOSCOPY (EGD) WITH PROPOFOL;  Surgeon: [name], MD;  Location[facility]COPY;  Service: Gastroenterology;  Laterality: N/A;   ESOPHAGOGASTRODUODENOSCOPY (EGD) WITH PROPOFOL N/A 11/13/2018   Procedure: ESOPHAGOGASTRODUODENOSCOPY (EGD) WITH PROPOFOL;  Surgeon: [name], MD;  Location: [facility] ENDOSCOPY;  Service: Gastroenterology;  Laterality: N/A;   GYNECOLOGIC CRYOSURGERY     NECK SURGERY  2012   PELVIC LAPAROSCOPY  91     Current Outpatient Medications  Medication Sig Dispense Refill   ARIPiprazole (ABILIFY) 15 MG tablet Take 15 mg by mouth at bedtime.     atorvastatin (LIPITOR) 40 MG tablet Take 1 tablet (40 mg total) by mouth daily. 90 tablet 0   benzonatate (TESSALON) 100 MG capsule Take 1-2 capsules (100-200 mg total) by mouth 3 (three) times daily as needed. 60 capsule 0   butalbital-acetaminophen-caffeine (FIORICET) 50-325-40 MG tablet Take 1-2 tablets by mouth every 6 (six) hours as needed for headache. 20 tablet 0  clonazePAM (KLONOPIN) 1 MG tablet Take 1 mg by mouth daily as needed for anxiety.     cyclobenzaprine (FLEXERIL) 10 MG tablet Take 10 mg by mouth 3 (three) times daily as needed for muscle spasms.     Dapagliflozin-metFORMIN HCl ER (XIGDUO XR) 11-998 MG TB24 Take 1 tablet by mouth daily. 90 tablet 0   DULoxetine (CYMBALTA) 60 MG capsule Take 60 mg by mouth at bedtime.  5    glipiZIDE (GLUCOTROL) 5 MG tablet Take 1 tablet (5 mg total) by mouth daily before supper. 90 tablet 0   hydrochlorothiazide (HYDRODIURIL) 12.5 MG tablet Take 1 tablet (12.5 mg total) by mouth daily. 90 tablet 0   HYDROcodone-acetaminophen (NORCO/VICODIN) 5-325 MG tablet Take 1-2 tablets by mouth every 6 (six) hours as needed for moderate pain or severe pain. 10 tablet 0   ibuprofen (ADVIL) 200 MG tablet Take 400 mg by mouth every 6 (six) hours as needed for mild pain.     insulin aspart (NOVOLOG) 100 UNIT/ML injection Inject 10 Units into the skin 3 (three) times daily before meals. 10 mL 1   losartan (COZAAR) 100 MG tablet Take 1 tablet (100 mg total) by mouth daily. Take 1 tablet daily by mouth. (Patient taking differently: Take 100 mg by mouth daily.) 90 tablet 0   meclizine (ANTIVERT) 12.5 MG tablet Take 1 tablet (12.5 mg total) by mouth 2 (two) times daily as needed for dizziness. 30 tablet 0   Naproxen Sod-diphenhydrAMINE (ALEVE PM) 220-25 MG TABS Take 2 tablets by mouth daily as needed (pain).     omeprazole (PRILOSEC) 20 MG capsule Take 20 mg by mouth at bedtime.  (Patient not taking: Reported on 11/03/2020)     ondansetron (ZOFRAN) 4 MG tablet Take 1 tablet (4 mg total) by mouth every 8 (eight) hours as needed for nausea or vomiting. (Patient not taking: Reported on 11/03/2020) 20 tablet 0   promethazine-dextromethorphan (PROMETHAZINE-DM) 6.25-15 MG/5ML syrup Take 5 mLs by mouth at bedtime as needed for cough. (Patient not taking: Reported on 11/03/2020) 100 mL 0   No current facility-administered medications for this visit.    Allergies:   Clindamycin/lincomycin, Other, and Lamotrigine   Social History:  The patient  reports that she has never smoked. She has never used smokeless tobacco. She reports that she does not drink alcohol and does not use drugs.   Family History:  The patient's family history includes Alcohol abuse in her father; Breast cancer (age of onset: 14) in her mother;  Depression in her maternal grandmother and mother; Diabetes in her maternal grandmother and mother; Heart attack (age of onset: 8) in her father; Heart disease in her father; Hypertension in her father; Stroke in her father; Throat cancer in her father; Thyroid disease in her mother; Uterine cancer in her maternal grandmother.    ROS:  Please see the history of present illness.   All other systems are personally reviewed and negative.    PHYSICAL EXAM: VS:  BP 104/60   Pulse (!) 112   Ht 5\' 1"  (1.549 m)   Wt 193 lb (87.5 kg)   LMP 05/12/2011   SpO2 95%   BMI 36.47 kg/m  , BMI Body mass index is 36.47 kg/m. GEN: Well nourished, well developed, in no acute distress HEENT: normal Neck: no JVD, carotid bruits, or masses Cardiac: RRR; no murmurs, rubs, or gallops,no edema  Respiratory:  clear to auscultation bilaterally, normal work of breathing GI: soft, nontender, nondistended, + BS MS: no deformity  or atrophy Skin: warm and dry  Neuro:  Strength and sensation are intact Psych: euthymic mood, full affect  EKG:  EKG is ordered today. The ekg ordered today is personally reviewed and shows sinus tachycardia 112 bpm   Recent Labs: 06/04/2020: BNP <2.5; TSH 2.750 07/29/2020: ALT 47 08/24/2020: BUN 10; Creatinine, Ser 0.74; Hemoglobin 13.3; Platelets 250; Potassium 4.0; Sodium 133  personally reviewed   Lipid Panel     Component Value Date/Time   CHOL 138 03/24/2020 1123   TRIG 140 03/24/2020 1123   HDL 40 03/24/2020 1123   CHOLHDL 3.5 03/24/2020 1123   CHOLHDL 3.7 06/16/2017 0417   VLDL 29 06/16/2017 0417   LDLCALC 73 03/24/2020 1123   personally reviewed   Wt Readings from Last 3 Encounters:  11/03/20 193 lb (87.5 kg)  07/29/20 193 lb 3.2 oz (87.6 kg)  07/08/20 191 lb 3.2 oz (86.7 kg)     Other studies personally reviewed: Additional studies/ records that were reviewed today include: Dr Hassell Done notes, prior echo, event montior  Review of the above records today  demonstrates: as above   ASSESSMENT AND PLAN:  1.  Paroxysmal atrial fibrillation/ atrial flutter The patient presents with afib.  Likely has atrial flutter also. Chads2vasc score is 3.  Start eliquis today.  We discussed risks and benefits to anticoagulation at length.  She is clear that she would like to start [facility] at this time.  Start diltiazem CD 120mg  daily Follow-up in AF clinic for titration of diltiazem if medically necessary.  Could also consider flecainide.  She would prefer medical therapy prior to consideration of ablation  2. HTN BP is low today Stop hctz Start diltiazem as above  3. Obesity Body mass index is 36.47 kg/m. Lifestyle modification is advised  Risks, benefits and potential toxicities for medications prescribed and/or refilled reviewed with patient today.    Follow-up:  AF clinic in 6 weeks     Signed, Thompson Grayer, MD  11/03/2020 11:38 AM     [facility] [address] [place] [address] [phone] (office) [phone] (fax)

## 2020-11-04 ENCOUNTER — Other Ambulatory Visit: Payer: Self-pay | Admitting: Physician Assistant

## 2020-11-05 ENCOUNTER — Other Ambulatory Visit: Payer: Self-pay | Admitting: Physician Assistant

## 2020-11-05 DIAGNOSIS — Z794 Long term (current) use of insulin: Secondary | ICD-10-CM

## 2020-11-05 DIAGNOSIS — E1169 Type 2 diabetes mellitus with other specified complication: Secondary | ICD-10-CM

## 2020-11-05 DIAGNOSIS — E785 Hyperlipidemia, unspecified: Secondary | ICD-10-CM

## 2020-11-05 NOTE — Telephone Encounter (Signed)
Called and left 2nd VM for patient to call back to schedule appt.

## 2020-11-11 ENCOUNTER — Other Ambulatory Visit: Payer: Self-pay

## 2020-11-11 ENCOUNTER — Ambulatory Visit: Payer: Federal, State, Local not specified - PPO | Admitting: Physician Assistant

## 2020-11-11 ENCOUNTER — Encounter: Payer: Self-pay | Admitting: Physician Assistant

## 2020-11-11 VITALS — BP 102/68 | HR 92 | Temp 98.1°F | Ht 61.0 in | Wt 196.8 lb

## 2020-11-11 DIAGNOSIS — F319 Bipolar disorder, unspecified: Secondary | ICD-10-CM

## 2020-11-11 DIAGNOSIS — I480 Paroxysmal atrial fibrillation: Secondary | ICD-10-CM | POA: Diagnosis not present

## 2020-11-11 DIAGNOSIS — I10 Essential (primary) hypertension: Secondary | ICD-10-CM | POA: Diagnosis not present

## 2020-11-11 DIAGNOSIS — E785 Hyperlipidemia, unspecified: Secondary | ICD-10-CM

## 2020-11-11 DIAGNOSIS — Z794 Long term (current) use of insulin: Secondary | ICD-10-CM

## 2020-11-11 DIAGNOSIS — E1169 Type 2 diabetes mellitus with other specified complication: Secondary | ICD-10-CM

## 2020-11-11 DIAGNOSIS — R202 Paresthesia of skin: Secondary | ICD-10-CM | POA: Diagnosis not present

## 2020-11-11 LAB — POCT GLYCOSYLATED HEMOGLOBIN (HGB A1C): Hemoglobin A1C: 8.2 % — AB (ref 4.0–5.6)

## 2020-11-11 NOTE — Assessment & Plan Note (Addendum)
-  A1c has improved from 9.8 to 8.2, discussed with patient treatment adjustments including to discontinue glipizide and start GLP-1 (has tolerated Ozempic in the past) or increase insulin dose. Patient prefers to increase insulin. Recommend increasing 2 units with lunch meal (12 units) and continue with 10 units with breakfast and dinner. Monitor for hypoglycemia.  -Recommend to reduce simple carbohydrates and glucose intake. -Follow up in 3 months. -Requesting eye exam report.

## 2020-11-11 NOTE — Assessment & Plan Note (Signed)
-  Stable. -Followed by psychiatry. -Continue current medication regimen.

## 2020-11-11 NOTE — Patient Instructions (Addendum)
Increase insulin aspart to 12 units before lunch, continue 10 units with breakfast and dinner.  Diabetes Mellitus and Nutrition, Adult When you have diabetes, or diabetes mellitus, it is very important to have healthy eating habits because your blood sugar (glucose) levels are greatly affected by what you eat and drink. Eating healthy foods in the right amounts, at about the same times every day, can help you: Control your blood glucose. Lower your risk of heart disease. Improve your blood pressure. Reach or maintain a healthy weight. What can affect my meal plan? Every person with diabetes is different, and each person has different needs for a meal plan. Your health care provider may recommend that you work with a dietitian to make a meal plan that is best for you. Your meal plan may vary depending on factors such as: The calories you need. The medicines you take. Your weight. Your blood glucose, blood pressure, and cholesterol levels. Your activity level. Other health conditions you have, such as heart or kidney disease. How do carbohydrates affect me? Carbohydrates, also called carbs, affect your blood glucose level more than any other type of food. Eating carbs naturally raises the amount of glucose in your blood. Carb counting is a method for keeping track of how many carbs you eat. Counting carbs is important to keep your blood glucose at a healthy level, especially if you use insulin or take certain oral diabetes medicines. It is important to know how many carbs you can safely have in each meal. This is different for every person. Your dietitian can help you calculate how many carbs you should have at each meal and for each snack. How does alcohol affect me? Alcohol can cause a sudden decrease in blood glucose (hypoglycemia), especially if you use insulin or take certain oral diabetes medicines. Hypoglycemia can be a life-threatening condition. Symptoms of hypoglycemia, such as  sleepiness, dizziness, and confusion, are similar to symptoms of having too much alcohol. Do not drink alcohol if: Your health care provider tells you not to drink. You are pregnant, may be pregnant, or are planning to become pregnant. If you drink alcohol: Do not drink on an empty stomach. Limit how much you use to: 0-1 drink a day for women. 0-2 drinks a day for men. Be aware of how much alcohol is in your drink. In the [place] one drink equals one 12 oz bottle of beer (355 mL), one 5 oz glass of wine (148 mL), or one 1 oz glass of hard liquor (44 mL). Keep yourself hydrated with water, diet soda, or unsweetened iced tea. Keep in mind that regular soda, juice, and other mixers may contain a lot of sugar and must be counted as carbs. What are tips for following this plan? Reading food labels Start by checking the serving size on the "Nutrition Facts" label of packaged foods and drinks. The amount of calories, carbs, fats, and other nutrients listed on the label is based on one serving of the item. Many items contain more than one serving per package. Check the total grams (g) of carbs in one serving. You can calculate the number of servings of carbs in one serving by dividing the total carbs by 15. For example, if a food has 30 g of total carbs per serving, it would be equal to 2 servings of carbs. Check the number of grams (g) of saturated fats and trans fats in one serving. Choose foods that have a low amount or none of these fats.  Check the number of milligrams (mg) of salt (sodium) in one serving. Most people should limit total sodium intake to less than 2,300 mg per day. Always check the nutrition information of foods labeled as "low-fat" or "nonfat." These foods may be higher in added sugar or refined carbs and should be avoided. Talk to your dietitian to identify your daily goals for nutrients listed on the label. Shopping Avoid buying canned, pre-made, or processed foods. These foods  tend to be high in fat, sodium, and added sugar. Shop around the outside edge of the grocery store. This is where you will most often find fresh fruits and vegetables, bulk grains, fresh meats, and fresh dairy. Cooking Use low-heat cooking methods, such as baking, instead of high-heat cooking methods like deep frying. Cook using healthy oils, such as olive, canola, or sunflower oil. Avoid cooking with butter, cream, or high-fat meats. Meal planning Eat meals and snacks regularly, preferably at the same times every day. Avoid going long periods of time without eating. Eat foods that are high in fiber, such as fresh fruits, vegetables, beans, and whole grains. Talk with your dietitian about how many servings of carbs you can eat at each meal. Eat 4-6 oz (112-168 g) of lean protein each day, such as lean meat, chicken, fish, eggs, or tofu. One ounce (oz) of lean protein is equal to: 1 oz (28 g) of meat, chicken, or fish. 1 egg.  cup (62 g) of tofu. Eat some foods each day that contain healthy fats, such as avocado, nuts, seeds, and fish. What foods should I eat? Fruits Berries. Apples. Oranges. Peaches. Apricots. Plums. Grapes. Mango. Papaya. Pomegranate. Kiwi. Cherries. Vegetables Lettuce. Spinach. Leafy greens, including kale, chard, collard greens, and mustard greens. Beets. Cauliflower. Cabbage. Broccoli. Carrots. Green beans. Tomatoes. Peppers. Onions. Cucumbers. Brussels sprouts. Grains Whole grains, such as whole-wheat or whole-grain bread, crackers, tortillas, cereal, and pasta. Unsweetened oatmeal. Quinoa. Brown or wild rice. Meats and other proteins Seafood. Poultry without skin. Lean cuts of poultry and beef. Tofu. Nuts. Seeds. Dairy Low-fat or fat-free dairy products such as milk, yogurt, and cheese. The items listed above may not be a complete list of foods and beverages you can eat. Contact a dietitian for more information. What foods should I avoid? Fruits Fruits canned with  syrup. Vegetables Canned vegetables. Frozen vegetables with butter or cream sauce. Grains Refined white flour and flour products such as bread, pasta, snack foods, and cereals. Avoid all processed foods. Meats and other proteins Fatty cuts of meat. Poultry with skin. Breaded or fried meats. Processed meat. Avoid saturated fats. Dairy Full-fat yogurt, cheese, or milk. Beverages Sweetened drinks, such as soda or iced tea. The items listed above may not be a complete list of foods and beverages you should avoid. Contact a dietitian for more information. Questions to ask a health care provider Do I need to meet with a diabetes educator? Do I need to meet with a dietitian? What number can I call if I have questions? When are the best times to check my blood glucose? Where to find more information: American Diabetes Association: diabetes.org Academy of Nutrition and Dietetics: www.eatright.[employer] of Diabetes and Digestive and Kidney Diseases: [employer] Association of Diabetes Care and Education Specialists: www.diabeteseducator.org Summary It is important to have healthy eating habits because your blood sugar (glucose) levels are greatly affected by what you eat and drink. A healthy meal plan will help you control your blood glucose and maintain a healthy lifestyle. Your health  care provider may recommend that you work with a dietitian to make a meal plan that is best for you. Keep in mind that carbohydrates (carbs) and alcohol have immediate effects on your blood glucose levels. It is important to count carbs and to use alcohol carefully. This information is not intended to replace advice given to you by your health care provider. Make sure you discuss any questions you have with your health care provider. Document Revised: 01/09/2019 Document Reviewed: 01/09/2019 Elsevier Patient Education  2021 [employer].

## 2020-11-11 NOTE — Assessment & Plan Note (Signed)
-  Last lipid panel wnl's, LDL 73 (goal <70). -Continue current medication regimen. -Discussed low fat diet. If LDL remains above goal recommend increasing atorvastatin. -Will repeat lipid panel (pt is fasting).

## 2020-11-11 NOTE — Progress Notes (Signed)
Established Patient Office Visit  Subjective:  Patient ID: Morgan [name], female    DOB: [birth year]  Age: 55 y.o. MRN: [id number]  CC:  Chief Complaint  Patient presents with   Follow-up   Diabetes   Hypertension   Hyperlipidemia    HPI Morgan [name] presents for follow up on diabetes mellitus, hypertension and hyperlipidemia. Reports brief episodes of facial numbness on left side which started few months ago. No headache, vision changes, slurred speech, rash, fever, or syncope.   Diabetes: Denies increased urination or thirst. Pt reports medication compliance. No hypoglycemic events. Checking glucose at home. FBS 120-160. States has not made changes with her diet.  HTN: Pt has been experiencing episodes of a-fib and has symptoms of her heart racing, chest pain and shortness of breath. Recently seen by cardiology and started on new medications (Apixaban and Diltiazem), does report some diarrhea since starting new meds. Denies dizziness or lower extremity swelling. Checks BP at home and readings have been 110s/70s.   HLD: Pt taking medication as directed without issues. Patient states tries to have a balance diet and limit red meat.  Mood: Continues to follow up with psychiatry. Denies SI/HI or mood changes. Reports medication compliance.  Past Medical History:  Diagnosis Date   Anxiety    Atrial fibrillation ([place])    Bipolar disorder ([place])    Breast mass    right, removed and benign   Cancer ([facility]) 09/16/2019   ovarian ca no radiation no chemo   Cervical dysplasia    Diabetes mellitus without complication (HCC)    Endometriosis    GERD (gastroesophageal reflux disease)    Hypercholesteremia    Hypertension    Migraine    Pelvic kidney    left   Postpartum depression    hx of    Past Surgical History:  Procedure Laterality Date   ABDOMINAL HYSTERECTOMY N/A    Phreesia 07/30/2019   BACK SURGERY  2014   BREAST LUMPECTOMY WITH RADIOACTIVE SEED LOCALIZATION Right  10/28/2014   Procedure: RIGHT BREAST LUMPECTOMY WITH RADIOACTIVE SEED LOCALIZATION;  Surgeon: [name], MD;  Location: [facility];  Service: General;  Laterality: Right;   BREAST LUMPECTOMY WITH RADIOACTIVE SEED LOCALIZATION Right 06/18/2019   Procedure: RIGHT BREAST RADIOACTIVE SEED LOCALIZATION LUMPECTOMY;  Surgeon: Jovita Kussmaul, MD;  Location: [facility];  Service: General;  Laterality: Right;   BREAST SURGERY N/A    Phreesia 07/30/2019   CESAREAN SECTION  03   COLONOSCOPY WITH PROPOFOL N/A 05/01/2018   Procedure: COLONOSCOPY WITH PROPOFOL;  Surgeon: [name], MD;  Location: [facility] ENDOSCOPY;  Service: Gastroenterology;  Laterality: N/A;   COLPOSCOPY     ESOPHAGOGASTRODUODENOSCOPY (EGD) WITH PROPOFOL N/A 05/01/2018   Procedure: ESOPHAGOGASTRODUODENOSCOPY (EGD) WITH PROPOFOL;  Surgeon: [name], MD;  Location: [facility] ENDOSCOPY;  Service: Gastroenterology;  Laterality: N/A;   ESOPHAGOGASTRODUODENOSCOPY (EGD) WITH PROPOFOL N/A 11/13/2018   Procedure: ESOPHAGOGASTRODUODENOSCOPY (EGD) WITH PROPOFOL;  Surgeon: [name], MD;  Location: [facility] ENDOSCOPY;  Service: Gastroenterology;  Laterality: N/A;   GYNECOLOGIC CRYOSURGERY     NECK SURGERY  2012   PELVIC LAPAROSCOPY  46    Family History  Problem Relation Age of Onset   Diabetes Mother    Breast cancer Mother 23   Thyroid disease Mother    Depression Mother    Hypertension Father    Heart disease Father    Heart attack Father 46       Two instances, first at  age 43   Stroke Father    Alcohol abuse Father    Throat cancer Father    Diabetes Maternal Grandmother    Depression Maternal Grandmother    Uterine cancer Maternal Grandmother     Social History   Socioeconomic History   Marital status: Married    Spouse name: Not on file   Number of children: Not on file   Years of education: Not on file   Highest education level: Not on file  Occupational History   Not on file  Tobacco Use    Smoking status: Never   Smokeless tobacco: Never  Vaping Use   Vaping Use: Never used  Substance and Sexual Activity   Alcohol use: No   Drug use: No   Sexual activity: Yes    Partners: Male    Birth control/protection: None    Comment: vasectomy  Other Topics Concern   Not on file  Social History Narrative   Lives in [place] with spouse and son.   Disabled.   Social Determinants of Health   Financial Resource Strain: Not on file  Food Insecurity: Not on file  Transportation Needs: Not on file  Physical Activity: Not on file  Stress: Not on file  Social Connections: Not on file  Intimate Partner Violence: Not on file    Outpatient Medications Prior to Visit  Medication Sig Dispense Refill   apixaban (ELIQUIS) 5 MG TABS tablet Take 1 tablet (5 mg total) by mouth 2 (two) times daily. 180 tablet 3   ARIPiprazole (ABILIFY) 15 MG tablet Take 15 mg by mouth at bedtime.     atorvastatin (LIPITOR) 40 MG tablet TAKE 1 TABLET (40 MG TOTAL) BY MOUTH DAILY. 30 tablet 0   butalbital-acetaminophen-caffeine (FIORICET) 50-325-40 MG tablet Take 1-2 tablets by mouth every 6 (six) hours as needed for headache. 20 tablet 0   clonazePAM (KLONOPIN) 1 MG tablet Take 1 mg by mouth daily as needed for anxiety.     cyclobenzaprine (FLEXERIL) 10 MG tablet Take 10 mg by mouth 3 (three) times daily as needed for muscle spasms.     diltiazem (CARDIZEM CD) 120 MG 24 hr capsule Take 1 capsule (120 mg total) by mouth daily. 90 capsule 3   DULoxetine (CYMBALTA) 60 MG capsule Take 60 mg by mouth at bedtime.  5   glipiZIDE (GLUCOTROL) 5 MG tablet Take 1 tablet (5 mg total) by mouth daily before supper. 90 tablet 0   insulin aspart (NOVOLOG) 100 UNIT/ML injection Inject 10 Units into the skin 3 (three) times daily before meals. 10 mL 1   losartan (COZAAR) 100 MG tablet Take 1 tablet (100 mg total) by mouth daily. Take 1 tablet daily by mouth. (Patient taking differently: Take 100 mg by mouth daily.) 90 tablet 0    meclizine (ANTIVERT) 12.5 MG tablet Take 1 tablet (12.5 mg total) by mouth 2 (two) times daily as needed for dizziness. 30 tablet 0   XIGDUO XR 11-998 MG TB24 TAKE 1 TABLET BY MOUTH EVERY DAY 90 tablet 0   benzonatate (TESSALON) 100 MG capsule Take 1-2 capsules (100-200 mg total) by mouth 3 (three) times daily as needed. 60 capsule 0   HYDROcodone-acetaminophen (NORCO/VICODIN) 5-325 MG tablet Take 1-2 tablets by mouth every 6 (six) hours as needed for moderate pain or severe pain. 10 tablet 0   ibuprofen (ADVIL) 200 MG tablet Take 400 mg by mouth every 6 (six) hours as needed for mild pain.     Naproxen  Sod-diphenhydrAMINE (ALEVE PM) 220-25 MG TABS Take 2 tablets by mouth daily as needed (pain).     omeprazole (PRILOSEC) 20 MG capsule Take 20 mg by mouth at bedtime.  (Patient not taking: No sig reported)     ondansetron (ZOFRAN) 4 MG tablet Take 1 tablet (4 mg total) by mouth every 8 (eight) hours as needed for nausea or vomiting. (Patient not taking: Reported on 11/03/2020) 20 tablet 0   promethazine-dextromethorphan (PROMETHAZINE-DM) 6.25-15 MG/5ML syrup Take 5 mLs by mouth at bedtime as needed for cough. (Patient not taking: Reported on 11/03/2020) 100 mL 0   No facility-administered medications prior to visit.    Allergies  Allergen Reactions   Clindamycin/Lincomycin Rash    Diffuse drug reaction eruption   Other Hives, Rash and Itching   Lamotrigine Rash    ROS Review of Systems Review of Systems:  A fourteen system review of systems was performed and found to be positive as per HPI.   Objective:    Physical Exam General:  Well Developed, well nourished, appropriate for stated age.  Neuro:  Alert and oriented,  extra-ocular muscles intact, no focal deficits   HEENT:  Normocephalic, atraumatic, neck supple Skin:  no gross rash, warm, pink. Cardiac:  RRR, S1 S2, no murmur  Respiratory:  CTA B/L, Not using accessory muscles, speaking in full sentences- unlabored. No edema   Vascular:  Ext warm, no cyanosis apprec.; cap RF less 2 sec. Psych:  No HI/SI, judgement and insight good, Euthymic mood. Full Affect.  BP 102/68   Pulse 92   Temp 98.1 F (36.7 C)   Ht '5\' 1"'  (1.549 m)   Wt 196 lb 12.8 oz (89.3 kg)   LMP 05/12/2011   SpO2 96%   BMI 37.19 kg/m  Wt Readings from Last 3 Encounters:  11/11/20 196 lb 12.8 oz (89.3 kg)  11/03/20 193 lb (87.5 kg)  07/29/20 193 lb 3.2 oz (87.6 kg)     Health Maintenance Due  Topic Date Due   COVID-19 Vaccine (1) Never done   Zoster Vaccines- Shingrix (1 of 2) Never done   OPHTHALMOLOGY EXAM  03/04/2019   INFLUENZA VACCINE  Never done    There are no preventive care reminders to display for this patient.  Lab Results  Component Value Date   TSH 2.750 06/04/2020   Lab Results  Component Value Date   WBC 9.1 08/24/2020   HGB 13.3 08/24/2020   HCT 41.9 08/24/2020   MCV 81.2 08/24/2020   PLT 250 08/24/2020   Lab Results  Component Value Date   NA 133 (L) 08/24/2020   K 4.0 08/24/2020   CO2 23 08/24/2020   GLUCOSE 307 (H) 08/24/2020   BUN 10 08/24/2020   CREATININE 0.74 08/24/2020   BILITOT 0.5 07/29/2020   ALKPHOS 120 07/29/2020   AST 36 07/29/2020   ALT 47 (H) 07/29/2020   PROT 6.4 07/29/2020   ALBUMIN 4.1 07/29/2020   CALCIUM 9.7 08/24/2020   ANIONGAP 11 08/24/2020   EGFR 110 07/29/2020   Lab Results  Component Value Date   CHOL 138 03/24/2020   Lab Results  Component Value Date   HDL 40 03/24/2020   Lab Results  Component Value Date   LDLCALC 73 03/24/2020   Lab Results  Component Value Date   TRIG 140 03/24/2020   Lab Results  Component Value Date   CHOLHDL 3.5 03/24/2020   Lab Results  Component Value Date   HGBA1C 8.2 (A) 11/11/2020  Assessment & Plan:   Problem List Items Addressed This Visit       Endocrine   Type 2 diabetes mellitus with other specified complication ([place]) - Primary    -A1c has improved from 9.8 to 8.2, discussed with patient treatment  adjustments including to discontinue glipizide and start GLP-1 (has tolerated Ozempic in the past) or increase insulin dose. Patient prefers to increase insulin. Recommend increasing 2 units with lunch meal (12 units) and continue with 10 units with breakfast and dinner. Monitor for hypoglycemia.  -Recommend to reduce simple carbohydrates and glucose intake. -Follow up in 3 months. -Requesting eye exam report.      Relevant Orders   POCT glycosylated hemoglobin (Hb A1C) (Completed)   Comp Met (CMET)   CBC w/Diff   Hyperlipidemia associated with type 2 diabetes mellitus (HCC)    -Last lipid panel wnl's, LDL 73 (goal <70). -Continue current medication regimen. -Discussed low fat diet. If LDL remains above goal recommend increasing atorvastatin. -Will repeat lipid panel (pt is fasting).      Relevant Orders   Lipid Profile   CBC w/Diff     Other   Bipolar disorder ([place])    -Stable. -Followed by psychiatry. -Continue current medication regimen.        Other Visit Diagnoses     Essential hypertension       Relevant Orders   Comp Met (CMET)   PAF (paroxysmal atrial fibrillation) (HCC)       Paresthesia          PAF: -Followed by Cardiology. -Reviewed consult 11/03/2020. -On Diltiazem and Apixaban.  Essential hypertension: -Soft blood pressure in office, ambulatory BP readings stable. If blood pressure continues to be consistently <105/60 recommend medication adjustments by decreasing Losartan to 50 mg. -Follow low salt diet. -Will continue to monitor. Will collect CMP to monitor renal function and electrolytes.  Paresthesia: -Reviewed CT head WO contrast 11/15/2019 for left sided headache with numbness and tingling sensation, unremarkable. -Etiology unclear. No focal deficits on exam. Discussed referral to neurology for further evaluation, patient will let me know if decides to pursue.     No orders of the defined types were placed in this encounter.   Follow-up:  Return in about 3 months (around 02/10/2021) for DM- inc insulin, HTN, HLD.    Lorrene Reid, PA-C

## 2020-11-12 LAB — COMPREHENSIVE METABOLIC PANEL
ALT: 46 IU/L — ABNORMAL HIGH (ref 0–32)
AST: 59 IU/L — ABNORMAL HIGH (ref 0–40)
Albumin/Globulin Ratio: 1.7 (ref 1.2–2.2)
Albumin: 4.3 g/dL (ref 3.8–4.9)
Alkaline Phosphatase: 129 IU/L — ABNORMAL HIGH (ref 44–121)
BUN/Creatinine Ratio: 16 (ref 9–23)
BUN: 10 mg/dL (ref 6–24)
Bilirubin Total: 0.6 mg/dL (ref 0.0–1.2)
CO2: 24 mmol/L (ref 20–29)
Calcium: 9.3 mg/dL (ref 8.7–10.2)
Chloride: 100 mmol/L (ref 96–106)
Creatinine, Ser: 0.61 mg/dL (ref 0.57–1.00)
Globulin, Total: 2.5 g/dL (ref 1.5–4.5)
Glucose: 96 mg/dL (ref 70–99)
Potassium: 4 mmol/L (ref 3.5–5.2)
Sodium: 138 mmol/L (ref 134–144)
Total Protein: 6.8 g/dL (ref 6.0–8.5)
eGFR: 107 mL/min/{1.73_m2} (ref >59)

## 2020-11-12 LAB — CBC WITH DIFFERENTIAL/PLATELET
Basophils Absolute: 0.1 10*3/uL (ref 0.0–0.2)
Basos: 1 %
EOS (ABSOLUTE): 0.4 10*3/uL (ref 0.0–0.4)
Eos: 5 %
Hematocrit: 39.4 % (ref 34.0–46.6)
Hemoglobin: 12.7 g/dL (ref 11.1–15.9)
Immature Grans (Abs): 0.1 10*3/uL (ref 0.0–0.1)
Immature Granulocytes: 1 %
Lymphocytes Absolute: 1.8 10*3/uL (ref 0.7–3.1)
Lymphs: 26 %
MCH: 25.2 pg — ABNORMAL LOW (ref 26.6–33.0)
MCHC: 32.2 g/dL (ref 31.5–35.7)
MCV: 78 fL — ABNORMAL LOW (ref 79–97)
Monocytes Absolute: 0.2 10*3/uL (ref 0.1–0.9)
Monocytes: 3 %
Neutrophils Absolute: 4.4 10*3/uL (ref 1.4–7.0)
Neutrophils: 64 %
Platelets: 229 10*3/uL (ref 150–450)
RBC: 5.03 x10E6/uL (ref 3.77–5.28)
RDW: 15.1 % (ref 11.7–15.4)
WBC: 7 10*3/uL (ref 3.4–10.8)

## 2020-11-12 LAB — LIPID PANEL
Chol/HDL Ratio: 4.2 ratio (ref 0.0–4.4)
Cholesterol, Total: 150 mg/dL (ref 100–199)
HDL: 36 mg/dL — ABNORMAL LOW (ref >39)
LDL Chol Calc (NIH): 85 mg/dL (ref 0–99)
Triglycerides: 164 mg/dL — ABNORMAL HIGH (ref 0–149)
VLDL Cholesterol Cal: 29 mg/dL (ref 5–40)

## 2020-11-26 ENCOUNTER — Other Ambulatory Visit: Payer: Self-pay | Admitting: Physician Assistant

## 2020-11-26 DIAGNOSIS — Z794 Long term (current) use of insulin: Secondary | ICD-10-CM

## 2020-11-26 DIAGNOSIS — E1169 Type 2 diabetes mellitus with other specified complication: Secondary | ICD-10-CM

## 2020-11-27 ENCOUNTER — Encounter: Payer: Self-pay | Admitting: Physician Assistant

## 2020-11-30 ENCOUNTER — Other Ambulatory Visit: Payer: Self-pay | Admitting: Physician Assistant

## 2020-11-30 DIAGNOSIS — E785 Hyperlipidemia, unspecified: Secondary | ICD-10-CM

## 2020-11-30 DIAGNOSIS — E1169 Type 2 diabetes mellitus with other specified complication: Secondary | ICD-10-CM

## 2020-12-17 ENCOUNTER — Ambulatory Visit (HOSPITAL_COMMUNITY): Payer: Federal, State, Local not specified - PPO | Admitting: Physician Assistant

## 2020-12-31 ENCOUNTER — Ambulatory Visit (HOSPITAL_COMMUNITY)
Admission: RE | Admit: 2020-12-31 | Discharge: 2020-12-31 | Disposition: A | Discharge status: Home / Self Care | Payer: Federal, State, Local not specified - PPO | Source: Ambulatory Visit | Attending: Physician Assistant | Admitting: Physician Assistant

## 2020-12-31 VITALS — BP 108/64 | HR 72 | Ht 61.0 in | Wt 195.6 lb

## 2020-12-31 DIAGNOSIS — E119 Type 2 diabetes mellitus without complications: Secondary | ICD-10-CM | POA: Diagnosis not present

## 2020-12-31 DIAGNOSIS — E669 Obesity, unspecified: Secondary | ICD-10-CM | POA: Diagnosis not present

## 2020-12-31 DIAGNOSIS — Z7901 Long term (current) use of anticoagulants: Secondary | ICD-10-CM | POA: Diagnosis not present

## 2020-12-31 DIAGNOSIS — I4892 Unspecified atrial flutter: Secondary | ICD-10-CM | POA: Diagnosis not present

## 2020-12-31 DIAGNOSIS — Z79899 Other long term (current) drug therapy: Secondary | ICD-10-CM | POA: Diagnosis not present

## 2020-12-31 DIAGNOSIS — I480 Paroxysmal atrial fibrillation: Secondary | ICD-10-CM | POA: Insufficient documentation

## 2020-12-31 DIAGNOSIS — Z6836 Body mass index (BMI) 36.0-36.9, adult: Secondary | ICD-10-CM | POA: Diagnosis not present

## 2020-12-31 DIAGNOSIS — Z7182 Exercise counseling: Secondary | ICD-10-CM | POA: Insufficient documentation

## 2020-12-31 DIAGNOSIS — I10 Essential (primary) hypertension: Secondary | ICD-10-CM | POA: Insufficient documentation

## 2020-12-31 DIAGNOSIS — D6869 Other thrombophilia: Secondary | ICD-10-CM | POA: Diagnosis not present

## 2020-12-31 LAB — CBC
HCT: 39.1 % (ref 36.0–46.0)
Hemoglobin: 11.9 g/dL — ABNORMAL LOW (ref 12.0–15.0)
MCH: 24.9 pg — ABNORMAL LOW (ref 26.0–34.0)
MCHC: 30.4 g/dL (ref 30.0–36.0)
MCV: 82 fL (ref 80.0–100.0)
Platelets: 209 10*3/uL (ref 150–400)
RBC: 4.77 MIL/uL (ref 3.87–5.11)
RDW: 15.6 % — ABNORMAL HIGH (ref 11.5–15.5)
WBC: 6 10*3/uL (ref 4.0–10.5)
nRBC: 0 % (ref 0.0–0.2)

## 2020-12-31 NOTE — Progress Notes (Addendum)
Primary Care Physician: Lorrene Reid, PA-C Primary Cardiologist: Dr Irish Lack  Primary Electrophysiologist: Dr Rayann Heman Referring Physician: Dr Hattie Perch is a 55 y.o. female with a history of HTN, atrial flutter, DM, and atrial fibrillation who presents for follow up in the [facility]. She was seen by him 07/08/20 and complained of intermittent palpitations.  She had a zio monitor placed.  This recorded rare PACs, PVCs as well as afib with burden of <1%.  The longest event was 49 min and 13 seconds.  There was also a more regular arrhythmia which was likely atrial flutter. Seen by Dr Rayann Heman on 11/03/20 and started on Eliquis for a CHADS2VASC score of 3 and diltiazem.  On follow up today, patient reports that she has done well on diltiazem. Her tachypalpitations have greatly improved. She denies any bleeding issues on anticoagulation.   Today, she denies symptoms of palpitations, chest pain, shortness of breath, orthopnea, PND, lower extremity edema, dizziness, presyncope, syncope, snoring, daytime somnolence, bleeding, or neurologic sequela. The patient is tolerating medications without difficulties and is otherwise without complaint today.    Atrial Fibrillation Risk Factors:  she does not have symptoms or diagnosis of sleep apnea. she does not have a history of rheumatic fever. she does not have a history of alcohol use.   she has a BMI of Body mass index is 36.96 kg/m.Marland Kitchen Filed Weights   12/31/20 1515  Weight: 88.7 kg    Family History  Problem Relation Age of Onset   Diabetes Mother    Breast cancer Mother 32   Thyroid disease Mother    Depression Mother    Hypertension Father    Heart disease Father    Heart attack Father 68       Two instances, first at age 92   Stroke Father    Alcohol abuse Father    Throat cancer Father    Diabetes Maternal Grandmother    Depression Maternal Grandmother    Uterine cancer Maternal  Grandmother      Atrial Fibrillation Management history:  Previous antiarrhythmic drugs: none Previous cardioversions: none Previous ablations: none CHADS2VASC score: 3 Anticoagulation history: Eliquis   Past Medical History:  Diagnosis Date   Anxiety    Atrial fibrillation ([place])    Bipolar disorder ([place])    Breast mass    right, removed and benign   Cancer ([place]) 09/16/2019   ovarian ca no radiation no chemo   Cervical dysplasia    Diabetes mellitus without complication ([place])    Endometriosis    GERD (gastroesophageal reflux disease)    Hypercholesteremia    Hypertension    Migraine    Pelvic kidney    left   Postpartum depression    hx of   Past Surgical History:  Procedure Laterality Date   ABDOMINAL HYSTERECTOMY N/A    Phreesia 07/30/2019   BACK SURGERY  2014   BREAST LUMPECTOMY WITH RADIOACTIVE SEED LOCALIZATION Right 10/28/2014   Procedure: RIGHT BREAST LUMPECTOMY WITH RADIOACTIVE SEED LOCALIZATION;  Surgeon: [name], MD;  Location: [facility];  Service: General;  Laterality: Right;   BREAST LUMPECTOMY WITH RADIOACTIVE SEED LOCALIZATION Right 06/18/2019   Procedure: RIGHT BREAST RADIOACTIVE SEED LOCALIZATION LUMPECTOMY;  Surgeon: Jovita Kussmaul, MD;  Location: [place];  Service: General;  Laterality: Right;   BREAST SURGERY N/A    Phreesia 07/30/2019   CESAREAN SECTION  03   COLONOSCOPY WITH PROPOFOL N/A  05/01/2018   Procedure: COLONOSCOPY WITH PROPOFOL;  Surgeon: [name], MD;  Location: [facility] ENDOSCOPY;  Service: Gastroenterology;  Laterality: N/A;   COLPOSCOPY     ESOPHAGOGASTRODUODENOSCOPY (EGD) WITH PROPOFOL N/A 05/01/2018   Procedure: ESOPHAGOGASTRODUODENOSCOPY (EGD) WITH PROPOFOL;  Surgeon: [name], MD;  Location: [facility] ENDOSCOPY;  Service: Gastroenterology;  Laterality: N/A;   ESOPHAGOGASTRODUODENOSCOPY (EGD) WITH PROPOFOL N/A 11/13/2018   Procedure: ESOPHAGOGASTRODUODENOSCOPY (EGD) WITH PROPOFOL;  Surgeon: [name], MD;  Location: [facility] ENDOSCOPY;  Service: Gastroenterology;  Laterality: N/A;   GYNECOLOGIC CRYOSURGERY     NECK SURGERY  2012   PELVIC LAPAROSCOPY  89    Current Outpatient Medications  Medication Sig Dispense Refill   acetaminophen (ACETAMINOPHEN EXTRA STRENGTH) 500 MG tablet Take 1,000 mg by mouth as needed. Takes 2-3 times weekly     apixaban (ELIQUIS) 5 MG TABS tablet Take 1 tablet (5 mg total) by mouth 2 (two) times daily. 180 tablet 3   ARIPiprazole (ABILIFY) 15 MG tablet Take 15 mg by mouth at bedtime.     atorvastatin (LIPITOR) 40 MG tablet TAKE 1 TABLET (40 MG TOTAL) BY MOUTH DAILY. 30 tablet 0   clonazePAM (KLONOPIN) 1 MG tablet Take 1 mg by mouth daily as needed for anxiety.     cyclobenzaprine (FLEXERIL) 10 MG tablet Take 10 mg by mouth 3 (three) times daily as needed for muscle spasms.     diltiazem (CARDIZEM CD) 120 MG 24 hr capsule Take 1 capsule (120 mg total) by mouth daily. 90 capsule 3   DULoxetine (CYMBALTA) 60 MG capsule Take 60 mg by mouth at bedtime.  5   glipiZIDE (GLUCOTROL) 5 MG tablet TAKE 1 TABLET (5 MG TOTAL) BY MOUTH DAILY BEFORE SUPPER. 90 tablet 0   insulin aspart (NOVOLOG) 100 UNIT/ML injection Inject 10 Units into the skin 3 (three) times daily before meals. 10 mL 1   losartan (COZAAR) 100 MG tablet Take 1 tablet (100 mg total) by mouth daily. Take 1 tablet daily by mouth. (Patient taking differently: Take 100 mg by mouth daily.) 90 tablet 0   omeprazole (PRILOSEC) 20 MG capsule Take 20 mg by mouth daily.     XIGDUO XR 11-998 MG TB24 TAKE 1 TABLET BY MOUTH EVERY DAY 90 tablet 0   No current facility-administered medications for this encounter.    Allergies  Allergen Reactions   Clindamycin/Lincomycin Rash    Diffuse drug reaction eruption   Other Hives, Rash and Itching   Lamotrigine Rash    Social History   Socioeconomic History   Marital status: Married    Spouse name: Not on file   Number of children: Not on file   Years of  education: Not on file   Highest education level: Not on file  Occupational History   Not on file  Tobacco Use   Smoking status: Never   Smokeless tobacco: Never  Vaping Use   Vaping Use: Never used  Substance and Sexual Activity   Alcohol use: No   Drug use: No   Sexual activity: Yes    Partners: Male    Birth control/protection: None    Comment: vasectomy  Other Topics Concern   Not on file  Social History Narrative   Lives in [place] with spouse and son.   Disabled.   Social Determinants of Health   Financial Resource Strain: Not on file  Food Insecurity: Not on file  Transportation Needs: Not on file  Physical Activity: Not on file  Stress: Not on  file  Social Connections: Not on file  Intimate Partner Violence: Not on file     ROS- All systems are reviewed and negative except as per the HPI above.  Physical Exam: Vitals:   12/31/20 1515  BP: 108/64  Pulse: 72  Weight: 88.7 kg  Height: 5\' 1"  (1.549 m)    GEN- The patient is a well appearing obese female, alert and oriented x 3 today.   Head- normocephalic, atraumatic Eyes-  Sclera clear, conjunctiva pink Ears- hearing intact Oropharynx- clear Neck- supple  Lungs- Clear to ausculation bilaterally, normal work of breathing Heart- Regular rate and rhythm, no murmurs, rubs or gallops  GI- soft, NT, ND, + BS Extremities- no clubbing, cyanosis, or edema MS- no significant deformity or atrophy Skin- no rash or lesion Psych- euthymic mood, full affect Neuro- strength and sensation are intact  Wt Readings from Last 3 Encounters:  12/31/20 88.7 kg  11/11/20 89.3 kg  11/03/20 87.5 kg    EKG today demonstrates  SR Vent. rate 72 BPM PR interval 166 ms QRS duration 84 ms QT/QTcB 356/389 ms  Echo 08/06/20 demonstrated   1. Left ventricular ejection fraction, by estimation, is 65 to 70%. The  left ventricle has normal function. The left ventricle has no regional  wall motion abnormalities. There is  mild left ventricular hypertrophy.  Left ventricular diastolic parameters were normal.   2. Right ventricular systolic function is normal. The right ventricular  size is normal. Tricuspid regurgitation signal is inadequate for assessing PA pressure.   3. The mitral valve is normal in structure. No evidence of mitral valve  regurgitation. No evidence of mitral stenosis.   4. The aortic valve is tricuspid. Aortic valve regurgitation is not  visualized. No aortic stenosis is present.   Epic records are reviewed at length today  CHA2DS2-VASc Score = 3  The patient's score is based upon: CHF History: 0 HTN History: 1 Diabetes History: 1 Stroke History: 0 Vascular Disease History: 0 Age Score: 0 Gender Score: 1      ASSESSMENT AND PLAN: 1. Paroxysmal Atrial Fibrillation/atrial flutter The patient's CHA2DS2-VASc score is 3, indicating a 3.2% annual risk of stroke.   Patient appears to be maintaining SR. Continue diltiazem 120 mg daily Continue Eliquis 5 mg BID. Stress importance of not missing doses of this medication.   2. Secondary Hypercoagulable State (ICD10:  D68.69) The patient is at significant risk for stroke/thromboembolism based upon her CHA2DS2-VASc Score of 3.  Continue Apixaban (Eliquis).   3. Obesity Body mass index is 36.96 kg/m. Lifestyle modification was discussed at length including regular exercise and weight reduction.  4. HTN Stable, no changes today.   Follow up in the AF clinic in 6 months.    [facility] [address] [place], [place] [address] [phone] 12/31/2020 4:02 PM

## 2021-01-22 ENCOUNTER — Encounter: Payer: Self-pay | Admitting: Nurse Practitioner

## 2021-01-22 ENCOUNTER — Other Ambulatory Visit: Payer: Self-pay

## 2021-01-22 ENCOUNTER — Ambulatory Visit: Payer: Federal, State, Local not specified - PPO | Admitting: Obstetrics & Gynecology

## 2021-01-22 ENCOUNTER — Ambulatory Visit: Payer: Federal, State, Local not specified - PPO | Admitting: Nurse Practitioner

## 2021-01-22 VITALS — BP 144/82 | HR 102

## 2021-01-22 DIAGNOSIS — N6452 Nipple discharge: Secondary | ICD-10-CM | POA: Diagnosis not present

## 2021-01-22 DIAGNOSIS — N6313 Unspecified lump in the right breast, lower outer quadrant: Secondary | ICD-10-CM

## 2021-01-22 NOTE — Progress Notes (Signed)
   Acute Office Visit  Subjective:    Patient ID: Morgan [name], female    DOB: 12/14/1965, 55 y.o.   MRN: [id number]   HPI 55 y.o. presents today for right breast lump and bilateral nipple discharge that she noticed this morning. Lump is non-tender, close to areola. She is at increased risk for breast cancer d/t mother's history (age 46), personal history of excision of high risk lesion of right breast in 2021, and personal history of ovarian cancer. She also had benign excision in 2016. Mammogram 08/2020 showed benign bilateral masses consistent with cysts. Nipple discharge is clear in right and brown in left, both with expression.    Review of Systems  Constitutional: Negative.   Hematological: Negative.   Right breast: Positive for mass and nipple discharge. Negative for redness, swelling, nipple inversion, skin changes Left breast: Positive for nipple discharge. Negative for mass, redness, swelling, nipple inversion, skin changes    Objective:    Physical Exam Constitutional:      Appearance: Normal appearance.  Chest:  Breasts:    Right: Mass present. No swelling, bleeding, inverted nipple, nipple discharge, skin change or tenderness.     Left: Normal. No swelling, bleeding, inverted nipple, mass, nipple discharge, skin change or tenderness.      BP (!) 144/82   Pulse (!) 102   LMP 05/12/2011   SpO2 95%  Wt Readings from Last 3 Encounters:  12/31/20 195 lb 9.6 oz (88.7 kg)  11/11/20 196 lb 12.8 oz (89.3 kg)  11/03/20 193 lb (87.5 kg)        Assessment & Plan:   Problem List Items Addressed This Visit   None Visit Diagnoses     Breast lump on right side at 7 o'clock position    -  Primary   Nipple discharge       Relevant Orders   Prolactin      Plan: Will send referral for bilateral breast ultrasounds. Prolactin today. Should also consider breast MRI due to increased risk of breast cancer.      [place], 4:19 PM 01/22/2021

## 2021-01-23 ENCOUNTER — Ambulatory Visit: Payer: Federal, State, Local not specified - PPO | Admitting: Obstetrics and Gynecology

## 2021-01-23 ENCOUNTER — Telehealth: Payer: Self-pay | Admitting: *Deleted

## 2021-01-23 DIAGNOSIS — N6452 Nipple discharge: Secondary | ICD-10-CM

## 2021-01-23 DIAGNOSIS — N6313 Unspecified lump in the right breast, lower outer quadrant: Secondary | ICD-10-CM

## 2021-01-23 NOTE — Telephone Encounter (Signed)
-----   Message from Tamela Gammon, NP sent at 01/22/2021  4:16 PM EST ----- Regarding: Breast ultrasound referral Please send referral for bilateral breast ultrasound for bilateral nipple discharge, mass at 7 o'clock on right breast.

## 2021-01-23 NOTE — Telephone Encounter (Signed)
Patient scheduled on 03/03/21 @ 2:10 pm. Patient informed.

## 2021-02-23 ENCOUNTER — Other Ambulatory Visit: Payer: Self-pay

## 2021-02-23 DIAGNOSIS — E1169 Type 2 diabetes mellitus with other specified complication: Secondary | ICD-10-CM

## 2021-02-23 MED ORDER — XIGDUO XR 10-1000 MG PO TB24: 1.0000 | ORAL_TABLET | Freq: Every day | ORAL | 0 refills | Status: DC

## 2021-02-26 ENCOUNTER — Emergency Department (HOSPITAL_COMMUNITY): Payer: Federal, State, Local not specified - PPO

## 2021-02-26 ENCOUNTER — Emergency Department (HOSPITAL_COMMUNITY)
Admission: EM | Admit: 2021-02-26 | Discharge: 2021-02-26 | Disposition: A | Discharge status: Home / Self Care | Payer: Federal, State, Local not specified - PPO | Attending: Emergency Medicine | Admitting: Emergency Medicine

## 2021-02-26 ENCOUNTER — Other Ambulatory Visit: Payer: Self-pay

## 2021-02-26 ENCOUNTER — Other Ambulatory Visit: Payer: Self-pay | Admitting: Physician Assistant

## 2021-02-26 ENCOUNTER — Encounter (HOSPITAL_COMMUNITY): Payer: Self-pay

## 2021-02-26 DIAGNOSIS — Z5321 Procedure and treatment not carried out due to patient leaving prior to being seen by health care provider: Secondary | ICD-10-CM | POA: Diagnosis not present

## 2021-02-26 DIAGNOSIS — R0602 Shortness of breath: Secondary | ICD-10-CM | POA: Insufficient documentation

## 2021-02-26 DIAGNOSIS — R079 Chest pain, unspecified: Secondary | ICD-10-CM | POA: Insufficient documentation

## 2021-02-26 LAB — CBC
HCT: 42.2 % (ref 36.0–46.0)
Hemoglobin: 13.2 g/dL (ref 12.0–15.0)
MCH: 24.8 pg — ABNORMAL LOW (ref 26.0–34.0)
MCHC: 31.3 g/dL (ref 30.0–36.0)
MCV: 79.2 fL — ABNORMAL LOW (ref 80.0–100.0)
Platelets: 253 10*3/uL (ref 150–400)
RBC: 5.33 MIL/uL — ABNORMAL HIGH (ref 3.87–5.11)
RDW: 15.5 % (ref 11.5–15.5)
WBC: 7.9 10*3/uL (ref 4.0–10.5)
nRBC: 0 % (ref 0.0–0.2)

## 2021-02-26 LAB — BASIC METABOLIC PANEL
Anion gap: 8 (ref 5–15)
BUN: 9 mg/dL (ref 6–20)
CO2: 25 mmol/L (ref 22–32)
Calcium: 9.8 mg/dL (ref 8.9–10.3)
Chloride: 104 mmol/L (ref 98–111)
Creatinine, Ser: 0.67 mg/dL (ref 0.44–1.00)
GFR, Estimated: >60 mL/min (ref >60)
Glucose, Bld: 163 mg/dL — ABNORMAL HIGH (ref 70–99)
Potassium: 3.6 mmol/L (ref 3.5–5.1)
Sodium: 137 mmol/L (ref 135–145)

## 2021-02-26 LAB — TROPONIN I (HIGH SENSITIVITY)
Troponin I (High Sensitivity): 4 ng/L (ref <18)
Troponin I (High Sensitivity): 4 ng/L (ref <18)

## 2021-02-26 LAB — BRAIN NATRIURETIC PEPTIDE: B Natriuretic Peptide: 8 pg/mL (ref 0.0–100.0)

## 2021-02-26 NOTE — ED Notes (Addendum)
Pt was called for vitals with no answer. Pt's labels were found. Pt did not claim her labels.

## 2021-02-26 NOTE — ED Triage Notes (Signed)
Pt arrived POV from home c/o centralized CP that started around 3pm today. Pt denies any radiation of pain. Pt endorses SHOB as well.

## 2021-02-26 NOTE — ED Provider Triage Note (Addendum)
Emergency Medicine Provider Triage Evaluation Note  Morgan [name] , a 56 y.o. female  was evaluated in triage.  Pt complains of pain that started around 3 PM today.  It is with associated shortness of breath.  She has the chest pain is in the center of her chest and does not radiate.  It is not pleuritic.  She does complain of some left arm decreased sensation. She has a history of atrial fibrillation and was supposed to be on Eliquis, however has not taken this in 6 weeks.  She does have a history of ovarian cancer from 1 year ago that apparently has been treated and has not active treatment.  Review of Systems  Positive: Chest pain, shortness of breath Negative:   Physical Exam  BP 115/66 (BP Location: Left Arm)    Pulse 99    Temp 98.6 F (37 C) (Oral)    Resp (!) 22    Ht 5\' 1"  (1.549 m)    Wt 86.2 kg    LMP 05/12/2011    SpO2 100%    BMI 35.90 kg/m  Gen:   Awake, no distress   Resp:  Labored breathing present tachypnea.  Lungs clear to auscultation bilaterally. MSK:   Moves extremities without difficulty  Other:  No obvious leg swelling or pain  Medical Decision Making  Medically screening exam initiated at 5:12 PM.  Appropriate orders placed.  Valisa R Keehan was informed that the remainder of the evaluation will be completed by another provider, this initial triage assessment does not replace that evaluation, and the importance of remaining in the ED until their evaluation is complete.  With history of cancer, heart rate is borderline tachycardic with rate of 100, not anticoagulation.  Patient is obviously short of breath with no signs of hypervolemia.  We will go ahead and get CTA chest to rule out PE   Adolphus Birchwood, PA-C 02/26/21 1714    Adolphus Birchwood, PA-C 02/26/21 1715

## 2021-03-03 ENCOUNTER — Other Ambulatory Visit: Payer: Self-pay | Admitting: Nurse Practitioner

## 2021-03-03 ENCOUNTER — Ambulatory Visit
Admission: RE | Admit: 2021-03-03 | Discharge: 2021-03-03 | Disposition: A | Discharge status: Home / Self Care | Payer: Federal, State, Local not specified - PPO | Source: Ambulatory Visit | Attending: Nurse Practitioner | Admitting: Nurse Practitioner

## 2021-03-03 ENCOUNTER — Ambulatory Visit: Payer: Federal, State, Local not specified - PPO

## 2021-03-03 DIAGNOSIS — N6313 Unspecified lump in the right breast, lower outer quadrant: Secondary | ICD-10-CM

## 2021-03-03 DIAGNOSIS — N6452 Nipple discharge: Secondary | ICD-10-CM

## 2021-03-04 ENCOUNTER — Encounter: Payer: Self-pay | Admitting: Nurse Practitioner

## 2021-03-04 ENCOUNTER — Telehealth: Payer: Self-pay | Admitting: Nurse Practitioner

## 2021-03-04 DIAGNOSIS — Z9189 Other specified personal risk factors, not elsewhere classified: Secondary | ICD-10-CM

## 2021-03-04 NOTE — Telephone Encounter (Signed)
Hayley patient scheduled for breast MRI.

## 2021-03-04 NOTE — Telephone Encounter (Signed)
IBIS 10-year breast cancer risk of 24.7%, lifetimes risk of 52.0%. Annual breast MRI recommended in conjunction with annual screening mammogram.

## 2021-03-17 ENCOUNTER — Other Ambulatory Visit: Payer: Federal, State, Local not specified - PPO

## 2021-03-30 ENCOUNTER — Ambulatory Visit
Admission: RE | Admit: 2021-03-30 | Discharge: 2021-03-30 | Disposition: A | Discharge status: Home / Self Care | Payer: Federal, State, Local not specified - PPO | Source: Ambulatory Visit | Attending: Nurse Practitioner | Admitting: Nurse Practitioner

## 2021-03-30 ENCOUNTER — Other Ambulatory Visit: Payer: Self-pay

## 2021-03-30 DIAGNOSIS — Z9189 Other specified personal risk factors, not elsewhere classified: Secondary | ICD-10-CM

## 2021-03-30 MED ORDER — GADOBUTROL 1 MMOL/ML IV SOLN
10.0000 mL | Freq: Once | INTRAVENOUS | Status: AC | PRN
  Administered 2021-03-30: 10 mL via INTRAVENOUS

## 2021-04-06 ENCOUNTER — Telehealth: Payer: Self-pay | Admitting: Physician Assistant

## 2021-04-06 NOTE — Telephone Encounter (Signed)
Patient states she has been having chest pain all day and that she has afib. Patient states her BP is 167/77.   I advised patient to go to ED for evaluation and treatment and to notify her cardiologist. Patient verbalized understanding. AS, CMA

## 2021-04-06 NOTE — Telephone Encounter (Signed)
Patient states she has been having chest pain all day and that she has a-fib. Her BP is 167/77.

## 2021-04-07 ENCOUNTER — Other Ambulatory Visit: Payer: Self-pay

## 2021-04-07 ENCOUNTER — Ambulatory Visit: Payer: Federal, State, Local not specified - PPO | Admitting: Physician Assistant

## 2021-04-07 ENCOUNTER — Encounter: Payer: Self-pay | Admitting: Physician Assistant

## 2021-04-07 VITALS — BP 108/65 | HR 80 | Temp 98.1°F | Ht 61.0 in | Wt 195.0 lb

## 2021-04-07 DIAGNOSIS — I10 Essential (primary) hypertension: Secondary | ICD-10-CM

## 2021-04-07 DIAGNOSIS — Z794 Long term (current) use of insulin: Secondary | ICD-10-CM

## 2021-04-07 DIAGNOSIS — R0600 Dyspnea, unspecified: Secondary | ICD-10-CM

## 2021-04-07 DIAGNOSIS — M7989 Other specified soft tissue disorders: Secondary | ICD-10-CM | POA: Diagnosis not present

## 2021-04-07 DIAGNOSIS — E785 Hyperlipidemia, unspecified: Secondary | ICD-10-CM

## 2021-04-07 DIAGNOSIS — I480 Paroxysmal atrial fibrillation: Secondary | ICD-10-CM

## 2021-04-07 DIAGNOSIS — E1169 Type 2 diabetes mellitus with other specified complication: Secondary | ICD-10-CM | POA: Diagnosis not present

## 2021-04-07 LAB — POCT GLYCOSYLATED HEMOGLOBIN (HGB A1C): Hemoglobin A1C: 7.2 % — AB (ref 4.0–5.6)

## 2021-04-07 MED ORDER — OZEMPIC (0.25 OR 0.5 MG/DOSE) 2 MG/1.5ML ~~LOC~~ SOPN: PEN_INJECTOR | SUBCUTANEOUS | 3 refills | Status: DC

## 2021-04-07 MED ORDER — LOSARTAN POTASSIUM 100 MG PO TABS: 100.0000 mg | ORAL_TABLET | Freq: Every day | ORAL | 1 refills | Status: DC

## 2021-04-07 MED ORDER — GLIPIZIDE 5 MG PO TABS: 5.0000 mg | ORAL_TABLET | Freq: Every day | ORAL | 1 refills | Status: DC

## 2021-04-07 MED ORDER — ATORVASTATIN CALCIUM 40 MG PO TABS: 40.0000 mg | ORAL_TABLET | Freq: Every day | ORAL | 1 refills | Status: DC

## 2021-04-07 NOTE — Assessment & Plan Note (Signed)
-  Followed by cardiology/a-fib clinic. -Cardiac exam normal. -On diltiazem 120 mg and Apixaban 5 mg.

## 2021-04-07 NOTE — Assessment & Plan Note (Addendum)
-  A1c has improved from 8.2 to 7.2, discussed with patient treatment adjustments to help improve diabetes and reach goal of A1c <7.0. Pt is agreeable to starting Ozempic, previously tolerated without issues. Continue Novolog 12 units before lunch, 10 units before breakfast and dinner. Continue Glipizide 5 mg before supper and Xigduo XR 11-998 mg daily. Discussed with patient if A1c improves and within goal then recommend to discontinue Glipizide. Pt verbalized understanding. Advised to monitor for hypoglycemic events and notify the office. Will continue to monitor.

## 2021-04-07 NOTE — Progress Notes (Signed)
Established patient visit   Patient: Morgan [name]   DOB: 05/29/1965   56 y.o. Female  MRN: [id number] Visit Date: 04/07/2021  Chief Complaint  Patient presents with   Follow-up   Diabetes   Hyperlipidemia   Hypertension   Subjective    HPI  Patient presents for follow-up on diabetes mellitus, hypertension and hyperlipidemia.  Diabetes: Pt denies increased urination or thirst. Pt reports medication compliance. No hypoglycemic events. Checking glucose at home. FBS range from 140-180s. States has reduced sodas.  HTN: Pt denies palpitations, dizziness or headache. Reports has swelling at her ankles. Pt denies increased salt intake. Taking medication as directed without side effects. Patient reports her chest hurt all day long yesterday and her blood pressure was elevated at 167/77. States she did not got to the ED and instead called paramedics and they did an EKG which was fine, her heart was in rhythm, but her blood pressure was fluctuating from 140-150/70-80. Feels fine today, no chest pain. Does report orthopnea x few weeks and continues to have shortness of breath mainly with exertion.  HLD: Pt taking medication as directed without issues. Denies side effects including myalgias, muscle cramps or muscle weakness.    Medications: Outpatient Medications Prior to Visit  Medication Sig   acetaminophen (TYLENOL) 500 MG tablet Take 1,000 mg by mouth as needed. Takes 2-3 times weekly   apixaban (ELIQUIS) 5 MG TABS tablet Take 1 tablet (5 mg total) by mouth 2 (two) times daily.   ARIPiprazole (ABILIFY) 15 MG tablet Take 15 mg by mouth at bedtime.   clonazePAM (KLONOPIN) 1 MG tablet Take 1 mg by mouth daily as needed for anxiety.   Dapagliflozin-metFORMIN HCl ER (XIGDUO XR) 11-998 MG TB24 Take 1 tablet by mouth daily.   diltiazem (CARDIZEM CD) 120 MG 24 hr capsule Take 1 capsule (120 mg total) by mouth daily.   DULoxetine (CYMBALTA) 60 MG capsule Take 60 mg by mouth at bedtime.    insulin aspart (NOVOLOG) 100 UNIT/ML injection Inject 10 Units into the skin 3 (three) times daily before meals.   NOVOLOG FLEXPEN 100 UNIT/ML FlexPen INJECT 10 UNITS SUBCUTANEOUSLY 3 TIMES A DAY   omeprazole (PRILOSEC) 20 MG capsule Take 20 mg by mouth daily.   [DISCONTINUED] atorvastatin (LIPITOR) 40 MG tablet Take 1 tablet (40 mg total) by mouth daily. **PLEASE CONTACT OUR OFFICE TO SCHEDULE A FOLLOW UP FOR FUTURE MED REFILLS**   [DISCONTINUED] glipiZIDE (GLUCOTROL) 5 MG tablet TAKE 1 TABLET (5 MG TOTAL) BY MOUTH DAILY BEFORE SUPPER.   [DISCONTINUED] losartan (COZAAR) 100 MG tablet Take 1 tablet (100 mg total) by mouth daily. Take 1 tablet daily by mouth. (Patient taking differently: Take 100 mg by mouth daily.)   No facility-administered medications prior to visit.    Review of Systems Review of Systems:  A fourteen system review of systems was performed and found to be positive as per HPI.     Objective    BP 108/65    Pulse 80    Temp 98.1 F (36.7 C)    Ht '5\' 1"'  (1.549 m)    Wt 195 lb (88.5 kg)    LMP 05/12/2011    SpO2 96%    BMI 36.84 kg/m  BP Readings from Last 3 Encounters:  04/07/21 108/65  02/26/21 115/66  01/22/21 (!) 144/82   Wt Readings from Last 3 Encounters:  04/07/21 195 lb (88.5 kg)  02/26/21 190 lb (86.2 kg)  12/31/20 195 lb 9.6 oz (88.7 kg)  Physical Exam  General:  Well Developed, well nourished, appropriate for stated age.  Neuro:  Alert and oriented,  extra-ocular muscles intact  HEENT:  Normocephalic, atraumatic, neck supple  Skin:  no gross rash, warm, pink. Cardiac:  RRR, S1 S2 Respiratory: CTA B/L  Extremities/Vascular:  Ext warm, no cyanosis apprec.; cap RF less 2 sec. No pitting edema. No calve tenderness, swelling or erythema. Psych:  No HI/SI, judgement and insight good, Euthymic mood. Full Affect.   Results for orders placed or performed in visit on 04/07/21  POCT glycosylated hemoglobin (Hb A1C)  Result Value Ref Range   Hemoglobin A1C  7.2 (A) 4.0 - 5.6 %   HbA1c POC (<> result, manual entry)     HbA1c, POC (prediabetic range)     HbA1c, POC (controlled diabetic range)      Assessment & Plan      Problem List Items Addressed This Visit       Cardiovascular and Mediastinum   Paroxysmal atrial fibrillation (HCC)    -Followed by cardiology/a-fib clinic. -Cardiac exam normal. -On diltiazem 120 mg and Apixaban 5 mg.      Relevant Medications   atorvastatin (LIPITOR) 40 MG tablet   losartan (COZAAR) 100 MG tablet   Essential hypertension    -BP in office well controlled at 108/65. Discussed with patient unclear etiology for elevated blood pressure yesterday. Recommend to continue monitoring BP/pulse at home. Continue current medication regimen. Will continue to monitor.      Relevant Medications   atorvastatin (LIPITOR) 40 MG tablet   losartan (COZAAR) 100 MG tablet     Endocrine   Type 2 diabetes mellitus with other specified complication (HCC) - Primary    -A1c has improved from 8.2 to 7.2, discussed with patient treatment adjustments to help improve diabetes and reach goal of A1c <7.0. Pt is agreeable to starting Ozempic, previously tolerated without issues. Continue Novolog 12 units before lunch, 10 units before breakfast and dinner. Continue Glipizide 5 mg before supper and Xigduo XR 11-998 mg daily. Discussed with patient if A1c improves and within goal then recommend to discontinue Glipizide. Pt verbalized understanding. Advised to monitor for hypoglycemic events and notify the office. Will continue to monitor.      Relevant Medications   atorvastatin (LIPITOR) 40 MG tablet   glipiZIDE (GLUCOTROL) 5 MG tablet   losartan (COZAAR) 100 MG tablet   Semaglutide,0.25 or 0.5MG/DOS, (OZEMPIC, 0.25 OR 0.5 MG/DOSE,) 2 MG/1.5ML SOPN   Other Relevant Orders   POCT glycosylated hemoglobin (Hb A1C) (Completed)   CBC w/Diff   Comp Met (CMET)   Hyperlipidemia associated with type 2 diabetes mellitus (HCC)    -Last  lipid panel: triglycerides 164, HDL 36, LDL 85 -Will repeat lipid panel and hepatic function today. -Continue current medication regimen. See med list. -Will continue to monitor.      Relevant Medications   atorvastatin (LIPITOR) 40 MG tablet   glipiZIDE (GLUCOTROL) 5 MG tablet   losartan (COZAAR) 100 MG tablet   Semaglutide,0.25 or 0.5MG/DOS, (OZEMPIC, 0.25 OR 0.5 MG/DOSE,) 2 MG/1.5ML SOPN   Other Relevant Orders   Lipid Profile   Other Visit Diagnoses     Swelling of lower extremity       Relevant Orders   TSH   B Nat Peptide   Dyspnea, unspecified type       Relevant Orders   TSH   B Nat Peptide      Dyspnea: -Chronic. Patient reports new symptom of orthopnea so will  collect BNP to evaluate for signs of acute CHF. Echocardiogram from 08/06/2020 revealed LVEF 65-70%. Chest Xray 02/26/2021 revealed normal heart size and no pleural effusion or pneumothorax. Physical deconditioning likely contributing to chronic shortness of breath. Patient has been evaluated by cardiology for shortness of breath with no cardiac etiology, recommend to consider referral to pulmonology.  Swelling of lower extremity: -Subjective. No pitting edema on exam. Discussed with patient localized swelling at ankle is more consistent with a ganglion cyst.    Return in about 4 months (around 08/05/2021) for CPE.        Lorrene Reid, PA-C  [facility] [phone] (phone) [phone] (fax)  [place]

## 2021-04-07 NOTE — Patient Instructions (Signed)
Shortness of Breath, Adult °Shortness of breath means you have trouble breathing. Shortness of breath could be a sign of a medical problem. °Follow these instructions at home: °Pollution °Do not smoke or use any products that contain nicotine or tobacco. If you need help quitting, ask your doctor. °Avoid things that can make it harder to breathe, such as: °Smoke of all kinds. This includes smoke from campfires or forest fires. Do not smoke or allow others to smoke in your home. °Mold. °Dust. °Air pollution. °Chemical smells. °Things that can give you an allergic reaction (allergens) if you have allergies. °Keep your living space clean. Use products that help remove mold and dust. °General instructions °Watch for any changes in your symptoms. °Take over-the-counter and prescription medicines only as told by your doctor. This includes oxygen therapy and inhaled medicines. °Rest as needed. °Return to your normal activities when your doctor says that it is safe. °Keep all follow-up visits. °Contact a doctor if: °Your condition does not get better as soon as expected. °You have a hard time doing your normal activities, even after you rest. °You have new symptoms. °You cannot walk up stairs. °You cannot exercise the way you normally do. °Get help right away if: °Your shortness of breath gets worse. °You have trouble breathing when you are resting. °You feel light-headed or you faint. °You have a cough that is not helped by medicines. °You cough up blood. °You have pain with breathing. °You have pain in your chest, arms, shoulders, or belly (abdomen). °You have a fever. °These symptoms may be an emergency. Get help right away. Call 911. °Do not wait to see if the symptoms will go away. °Do not drive yourself to the hospital. °Summary °Shortness of breath is when you have trouble breathing enough air. It can be a sign of a medical problem. °Avoid things that make it hard for you to breathe, such as smoking, pollution, mold,  and dust. °Watch for any changes in your symptoms. Contact your doctor if you do not get better or you get worse. °This information is not intended to replace advice given to you by your health care provider. Make sure you discuss any questions you have with your health care provider. °Document Revised: 09/20/2020 Document Reviewed: 09/20/2020 °Elsevier Patient Education © 2022 [employer] ° °

## 2021-04-07 NOTE — Assessment & Plan Note (Signed)
-  Last lipid panel: triglycerides 164, HDL 36, LDL 85 -Will repeat lipid panel and hepatic function today. -Continue current medication regimen. See med list. -Will continue to monitor.

## 2021-04-07 NOTE — Assessment & Plan Note (Signed)
-  BP in office well controlled at 108/65. Discussed with patient unclear etiology for elevated blood pressure yesterday. Recommend to continue monitoring BP/pulse at home. Continue current medication regimen. Will continue to monitor.

## 2021-04-08 LAB — COMPREHENSIVE METABOLIC PANEL WITH GFR
ALT: 26 IU/L (ref 0–32)
AST: 27 IU/L (ref 0–40)
Albumin/Globulin Ratio: 1.8 (ref 1.2–2.2)
Albumin: 4.1 g/dL (ref 3.8–4.9)
Alkaline Phosphatase: 134 IU/L — ABNORMAL HIGH (ref 44–121)
BUN/Creatinine Ratio: 14 (ref 9–23)
BUN: 10 mg/dL (ref 6–24)
Bilirubin Total: 0.4 mg/dL (ref 0.0–1.2)
CO2: 25 mmol/L (ref 20–29)
Calcium: 9.6 mg/dL (ref 8.7–10.2)
Chloride: 101 mmol/L (ref 96–106)
Creatinine, Ser: 0.7 mg/dL (ref 0.57–1.00)
Globulin, Total: 2.3 g/dL (ref 1.5–4.5)
Glucose: 124 mg/dL — ABNORMAL HIGH (ref 70–99)
Potassium: 3.8 mmol/L (ref 3.5–5.2)
Sodium: 139 mmol/L (ref 134–144)
Total Protein: 6.4 g/dL (ref 6.0–8.5)
eGFR: 103 mL/min/1.73

## 2021-04-08 LAB — CBC WITH DIFFERENTIAL/PLATELET
Basophils Absolute: 0.1 10*3/uL (ref 0.0–0.2)
Basos: 1 %
EOS (ABSOLUTE): 0.2 10*3/uL (ref 0.0–0.4)
Eos: 3 %
Hematocrit: 40.3 % (ref 34.0–46.6)
Hemoglobin: 12.9 g/dL (ref 11.1–15.9)
Immature Grans (Abs): 0 10*3/uL (ref 0.0–0.1)
Immature Granulocytes: 1 %
Lymphocytes Absolute: 1.8 10*3/uL (ref 0.7–3.1)
Lymphs: 31 %
MCH: 24.3 pg — ABNORMAL LOW (ref 26.6–33.0)
MCHC: 32 g/dL (ref 31.5–35.7)
MCV: 76 fL — ABNORMAL LOW (ref 79–97)
Monocytes Absolute: 0.2 10*3/uL (ref 0.1–0.9)
Monocytes: 3 %
Neutrophils Absolute: 3.6 10*3/uL (ref 1.4–7.0)
Neutrophils: 61 %
Platelets: 226 10*3/uL (ref 150–450)
RBC: 5.3 x10E6/uL — ABNORMAL HIGH (ref 3.77–5.28)
RDW: 15.8 % — ABNORMAL HIGH (ref 11.7–15.4)
WBC: 5.9 10*3/uL (ref 3.4–10.8)

## 2021-04-08 LAB — LIPID PANEL
Chol/HDL Ratio: 3.6 ratio (ref 0.0–4.4)
Cholesterol, Total: 141 mg/dL (ref 100–199)
HDL: 39 mg/dL — ABNORMAL LOW
LDL Chol Calc (NIH): 80 mg/dL (ref 0–99)
Triglycerides: 121 mg/dL (ref 0–149)
VLDL Cholesterol Cal: 22 mg/dL (ref 5–40)

## 2021-04-08 LAB — TSH: TSH: 2 u[IU]/mL (ref 0.450–4.500)

## 2021-04-08 LAB — BRAIN NATRIURETIC PEPTIDE: BNP: 28.2 pg/mL (ref 0.0–100.0)

## 2021-04-15 ENCOUNTER — Other Ambulatory Visit: Payer: Self-pay

## 2021-04-15 ENCOUNTER — Emergency Department
Admission: EM | Admit: 2021-04-15 | Discharge: 2021-04-15 | Disposition: A | Discharge status: Home / Self Care | Payer: Federal, State, Local not specified - PPO | Attending: Emergency Medicine | Admitting: Emergency Medicine

## 2021-04-15 ENCOUNTER — Emergency Department: Payer: Federal, State, Local not specified - PPO

## 2021-04-15 DIAGNOSIS — R059 Cough, unspecified: Secondary | ICD-10-CM | POA: Insufficient documentation

## 2021-04-15 DIAGNOSIS — Z20822 Contact with and (suspected) exposure to covid-19: Secondary | ICD-10-CM | POA: Diagnosis not present

## 2021-04-15 DIAGNOSIS — R079 Chest pain, unspecified: Secondary | ICD-10-CM

## 2021-04-15 DIAGNOSIS — R0789 Other chest pain: Secondary | ICD-10-CM | POA: Diagnosis not present

## 2021-04-15 DIAGNOSIS — I10 Essential (primary) hypertension: Secondary | ICD-10-CM | POA: Insufficient documentation

## 2021-04-15 LAB — CBC
HCT: 40.9 % (ref 36.0–46.0)
Hemoglobin: 12.4 g/dL (ref 12.0–15.0)
MCH: 24.1 pg — ABNORMAL LOW (ref 26.0–34.0)
MCHC: 30.3 g/dL (ref 30.0–36.0)
MCV: 79.4 fL — ABNORMAL LOW (ref 80.0–100.0)
Platelets: 214 10*3/uL (ref 150–400)
RBC: 5.15 MIL/uL — ABNORMAL HIGH (ref 3.87–5.11)
RDW: 15.6 % — ABNORMAL HIGH (ref 11.5–15.5)
WBC: 5.9 10*3/uL (ref 4.0–10.5)
nRBC: 0 % (ref 0.0–0.2)

## 2021-04-15 LAB — TROPONIN I (HIGH SENSITIVITY)
Troponin I (High Sensitivity): 3 ng/L (ref <18)
Troponin I (High Sensitivity): 3 ng/L (ref <18)

## 2021-04-15 LAB — BASIC METABOLIC PANEL
Anion gap: 11 (ref 5–15)
BUN: 10 mg/dL (ref 6–20)
CO2: 26 mmol/L (ref 22–32)
Calcium: 9.6 mg/dL (ref 8.9–10.3)
Chloride: 102 mmol/L (ref 98–111)
Creatinine, Ser: 0.58 mg/dL (ref 0.44–1.00)
GFR, Estimated: >60 mL/min (ref >60)
Glucose, Bld: 158 mg/dL — ABNORMAL HIGH (ref 70–99)
Potassium: 3.8 mmol/L (ref 3.5–5.1)
Sodium: 139 mmol/L (ref 135–145)

## 2021-04-15 LAB — RESP PANEL BY RT-PCR (FLU A&B, COVID) ARPGX2
Influenza A by PCR: NEGATIVE
Influenza B by PCR: NEGATIVE
SARS Coronavirus 2 by RT PCR: NEGATIVE

## 2021-04-15 MED ORDER — IOHEXOL 350 MG/ML SOLN
75.0000 mL | Freq: Once | INTRAVENOUS | Status: AC | PRN
  Administered 2021-04-15: 75 mL via INTRAVENOUS

## 2021-04-15 MED ORDER — MORPHINE SULFATE (PF) 4 MG/ML IV SOLN
4.0000 mg | Freq: Once | INTRAVENOUS | Status: AC
Start: 2021-04-15 — End: 2021-04-15
  Administered 2021-04-15: 4 mg via INTRAVENOUS
  Filled 2021-04-15: qty 1

## 2021-04-15 MED ORDER — ONDANSETRON HCL 4 MG/2ML IJ SOLN
4.0000 mg | Freq: Once | INTRAMUSCULAR | Status: AC
  Administered 2021-04-15: 4 mg via INTRAVENOUS
  Filled 2021-04-15: qty 2

## 2021-04-15 NOTE — ED Triage Notes (Signed)
Patient to ER via POV with complaints of centralized chest pain that radiates into back, describes it as burning/ pressure like and occasionally sharp. Reports waking up this morning around 8am feeling badly. Also experiencing shortness of breath and heart palpations. ? ?Hx of afib.  ?

## 2021-04-15 NOTE — ED Provider Notes (Signed)
? ?[facility] ?Provider Note ? ? ? Event Date/Time  ? First MD Initiated Contact with Patient 04/15/21 1954   ?  (approximate) ? ?History  ? ?Chief Complaint: Chest Pain ? ?HPI ? ?Morgan [name] is a 56 y.o. female with a past medical history of bipolar, anxiety, hypertension, hyperlipidemia who presents to the emergency department for chest pain.  According to the patient since 8:00 this morning she has been experiencing central sharp chest pain.  States the pain is worse if she takes a deep inspiration.  Also states slight cough since this morning as well.  No fever or congestion.  No leg pain or swelling.  No history of blood clots.  No estrogen use. ? ?Physical Exam  ? ?Triage Vital Signs: ?ED Triage Vitals  ?Enc Vitals Group  ?   BP 04/15/21 1847 (!) 149/85  ?   Pulse Rate 04/15/21 1847 87  ?   Resp 04/15/21 1847 17  ?   Temp 04/15/21 1847 98 ?F (36.7 ?C)  ?   Temp Source 04/15/21 1847 Oral  ?   SpO2 04/15/21 1847 97 %  ?   Weight 04/15/21 1845 195 lb (88.5 kg)  ?   Height 04/15/21 1845 5\' 1"  (1.549 m)  ?   Head Circumference --   ?   Peak Flow --   ?   Pain Score 04/15/21 1845 7  ?   Pain Loc --   ?   Pain Edu? --   ?   Excl. in [place]? --   ? ? ?Most recent vital signs: ?Vitals:  ? 04/15/21 1847  ?BP: (!) 149/85  ?Pulse: 87  ?Resp: 17  ?Temp: 98 ?F (36.7 ?C)  ?SpO2: 97%  ? ? ?General: Awake, no distress.  ?CV:  Good peripheral perfusion.  Regular rate and rhythm  ?Resp:  Normal effort.  Equal breath sounds bilaterally.  ?Abd:  No distention.  Soft, nontender.  No rebound or guarding. ?Other:  No lower extremity edema or calf tenderness. ? ? ?ED Results / Procedures / Treatments  ? ?EKG ? ?EKG viewed and interpreted by myself shows a normal sinus rhythm at 84 bpm with a narrow QRS, normal axis, normal intervals, no concerning ST changes. ? ?RADIOLOGY ? ?I have personally reviewed the chest x-ray images.  No acute findings on my evaluation. ?Radiology is read the chest x-ray is  negative. ?CTA of the chest is negative for PE. ? ?MEDICATIONS ORDERED IN ED: ?Medications  ?morphine (PF) 4 MG/ML injection 4 mg (has no administration in time range)  ?ondansetron (ZOFRAN) injection 4 mg (has no administration in time range)  ? ? ? ?IMPRESSION / MDM / ASSESSMENT AND PLAN / ED COURSE  ?I reviewed the triage vital signs and the nursing notes. ? ?Patient presents to the emergency department for chest pain that started around 8:00 this morning.  Continues to have pain which she describes as moderate, sharp and worse with inspiration.  Also states she has been coughing since this morning.  Patient's work-up in the emergency department is overall reassuring.  Chest x-ray is clear.  EKG is reassuring.  Lab work including cardiac enzymes are negative.  Normal CBC and normal chemistry.  However given the patient's description of the pleuritic chest pain we will obtain a CTA of the chest to rule out pulmonary embolism.  Given the cough starting this morning we will also obtain a COVID/flu test as a precaution.  Patient is agreeable to this plan  of care.  We will treat pain and nausea while awaiting CTA results.  We will also repeat a troponin. ? ?Patient's repeat troponin is normal.  CT of the chest is negative.  COVID/flu is normal.  Patient reassured by today's work-up.  I discussed return precautions with the patient.  Patient will follow-up with her doctor. ? ?FINAL CLINICAL IMPRESSION(S) / ED DIAGNOSES  ? ?Chest pain ? ?Rx / DC Orders  ? ?PCP follow-up ? ? ?Note:  This document was prepared using Dragon voice recognition software and may include unintentional dictation errors. ?  Harvest Dark, MD ?04/15/21 2136 ? ?

## 2021-04-15 NOTE — ED Notes (Signed)
Pt teaching provided on medications that may cause drowsiness. Pt instructed not to drive or operate heavy machinery while taking the prescribed medication. Pt verbalized understanding.  ? ?Pt provided discharge instructions and prescription information. Pt was given the opportunity to ask questions and questions were answered. Discharge signature not obtained in the setting of the COVID-19 pandemic in order to reduce high touch surfaces.  ? ?

## 2021-05-09 ENCOUNTER — Emergency Department: Payer: Federal, State, Local not specified - PPO

## 2021-05-09 ENCOUNTER — Other Ambulatory Visit: Payer: Self-pay

## 2021-05-09 ENCOUNTER — Encounter: Payer: Self-pay | Admitting: Emergency Medicine

## 2021-05-09 ENCOUNTER — Emergency Department
Admission: EM | Admit: 2021-05-09 | Discharge: 2021-05-09 | Disposition: A | Discharge status: Home / Self Care | Payer: Federal, State, Local not specified - PPO | Attending: Emergency Medicine | Admitting: Emergency Medicine

## 2021-05-09 DIAGNOSIS — R109 Unspecified abdominal pain: Secondary | ICD-10-CM | POA: Insufficient documentation

## 2021-05-09 DIAGNOSIS — Z87442 Personal history of urinary calculi: Secondary | ICD-10-CM | POA: Diagnosis not present

## 2021-05-09 LAB — CBC
HCT: 38 % (ref 36.0–46.0)
Hemoglobin: 11.6 g/dL — ABNORMAL LOW (ref 12.0–15.0)
MCH: 24.5 pg — ABNORMAL LOW (ref 26.0–34.0)
MCHC: 30.5 g/dL (ref 30.0–36.0)
MCV: 80.2 fL (ref 80.0–100.0)
Platelets: 193 10*3/uL (ref 150–400)
RBC: 4.74 MIL/uL (ref 3.87–5.11)
RDW: 15.9 % — ABNORMAL HIGH (ref 11.5–15.5)
WBC: 8.8 10*3/uL (ref 4.0–10.5)
nRBC: 0 % (ref 0.0–0.2)

## 2021-05-09 LAB — COMPREHENSIVE METABOLIC PANEL
ALT: 30 U/L (ref 0–44)
AST: 27 U/L (ref 15–41)
Albumin: 3.9 g/dL (ref 3.5–5.0)
Alkaline Phosphatase: 93 U/L (ref 38–126)
Anion gap: 7 (ref 5–15)
BUN: 14 mg/dL (ref 6–20)
CO2: 24 mmol/L (ref 22–32)
Calcium: 9.2 mg/dL (ref 8.9–10.3)
Chloride: 105 mmol/L (ref 98–111)
Creatinine, Ser: 0.72 mg/dL (ref 0.44–1.00)
GFR, Estimated: >60 mL/min (ref >60)
Glucose, Bld: 119 mg/dL — ABNORMAL HIGH (ref 70–99)
Potassium: 4 mmol/L (ref 3.5–5.1)
Sodium: 136 mmol/L (ref 135–145)
Total Bilirubin: 0.8 mg/dL (ref 0.3–1.2)
Total Protein: 7 g/dL (ref 6.5–8.1)

## 2021-05-09 LAB — URINALYSIS, ROUTINE W REFLEX MICROSCOPIC
Bacteria, UA: NONE SEEN
Bilirubin Urine: NEGATIVE
Glucose, UA: >=500 mg/dL — AB
Hgb urine dipstick: NEGATIVE
Ketones, ur: NEGATIVE mg/dL
Leukocytes,Ua: NEGATIVE
Nitrite: NEGATIVE
Protein, ur: NEGATIVE mg/dL
Specific Gravity, Urine: 1.025 (ref 1.005–1.030)
Squamous Epithelial / HPF: NONE SEEN (ref 0–5)
pH: 5 (ref 5.0–8.0)

## 2021-05-09 MED ORDER — SODIUM CHLORIDE 0.9 % IV BOLUS
1000.0000 mL | Freq: Once | INTRAVENOUS | Status: AC
Start: 2021-05-09 — End: 2021-05-09
  Administered 2021-05-09: 1000 mL via INTRAVENOUS

## 2021-05-09 MED ORDER — OXYCODONE-ACETAMINOPHEN 5-325 MG PO TABS
1.0000 | ORAL_TABLET | Freq: Once | ORAL | Status: AC
  Administered 2021-05-09: 1 via ORAL
  Filled 2021-05-09: qty 1

## 2021-05-09 MED ORDER — FENTANYL CITRATE PF 50 MCG/ML IJ SOSY
50.0000 ug | PREFILLED_SYRINGE | Freq: Once | INTRAMUSCULAR | Status: AC
  Administered 2021-05-09: 50 ug via INTRAVENOUS
  Filled 2021-05-09: qty 1

## 2021-05-09 MED ORDER — TRAMADOL HCL 50 MG PO TABS: 50.0000 mg | ORAL_TABLET | Freq: Four times a day (QID) | ORAL | 0 refills | Status: DC | PRN

## 2021-05-09 MED ORDER — ONDANSETRON HCL 4 MG/2ML IJ SOLN
4.0000 mg | Freq: Once | INTRAMUSCULAR | Status: AC
  Administered 2021-05-09: 4 mg via INTRAVENOUS
  Filled 2021-05-09: qty 2

## 2021-05-09 NOTE — ED Notes (Signed)
Pt verbalized understanding of discharge instructions and follow-up care instructions. Pt advised if symptoms worsen to return to ED.  

## 2021-05-09 NOTE — ED Provider Notes (Signed)
? ?  [facility] ?Provider Note ? ? ? Event Date/Time  ? First MD Initiated Contact with Patient 05/09/21 2108   ?  (approximate) ? ?History  ? ?Chief Complaint: Flank Pain ? ?HPI ? ?Morgan [name] is a 56 y.o. female with a past medical history of kidney stones presents to the emergency department for right flank pain.  According to the patient around 6 PM tonight she developed acute onset of right flank pain.  Denies any vomiting but does state nausea.  No diarrhea.  Patient states a history of a kidney stone previously to which this feels similar.  Denies any dysuria or hematuria.  No fever. ? ? ?Physical Exam  ? ?Triage Vital Signs: ?ED Triage Vitals  ?Enc Vitals Group  ?   BP 05/09/21 2052 128/74  ?   Pulse Rate 05/09/21 2052 83  ?   Resp 05/09/21 2052 16  ?   Temp 05/09/21 2052 98.1 ?F (36.7 ?C)  ?   Temp Source 05/09/21 2052 Oral  ?   SpO2 05/09/21 2052 98 %  ?   Weight 05/09/21 2053 185 lb (83.9 kg)  ?   Height 05/09/21 2053 '5\' 1"'$  (1.549 m)  ?   Head Circumference --   ?   Peak Flow --   ?   Pain Score 05/09/21 2053 9  ?   Pain Loc --   ?   Pain Edu? --   ?   Excl. in [place]? --   ? ? ?Most recent vital signs: ?Vitals:  ? 05/09/21 2052  ?BP: 128/74  ?Pulse: 83  ?Resp: 16  ?Temp: 98.1 ?F (36.7 ?C)  ?SpO2: 98%  ? ? ?General: Awake, no distress.  ?CV:  Good peripheral perfusion.  Regular rate and rhythm  ?Resp:  Normal effort.  Equal breath sounds bilaterally.  ?Abd:  No distention.  Soft, mild right upper and lower quadrant tenderness palpation without rebound or guarding. ? ? ? ?ED Results / Procedures / Treatments  ? ?RADIOLOGY ? ?I personally reviewed the CT images, no significant abnormality seen on my evaluation. ?Radiology is read the CT is negative. ? ? ?MEDICATIONS ORDERED IN ED: ?Medications - No data to display ? ? ?IMPRESSION / MDM / ASSESSMENT AND PLAN / ED COURSE  ?I reviewed the triage vital signs and the nursing notes. ? ?Patient presents to the emergency department for right  flank pain.  States symptoms began around 6 PM tonight.  States it feels like a prior kidney stone.  States nausea but no vomiting.  No diarrhea.  No hematuria or dysuria.  No fever.  We will check labs including urinalysis CBC and CMP.  We will obtain a CT renal scan to evaluate for ureterolithiasis should also help [place] evaluate the appendix.  Differential is quite broad but would include ureterolithiasis, appendicitis, less likely biliary disease, diverticulitis pancreatitis nor colitis.  We will also obtain a urine to rule out UTI/pyelonephritis. ? ?Patient's work-up is reassuring.  Normal CBC.  Normal chemistry.  Urinalysis appears normal as well.  CT scan is negative.  Will discharge patient with a short course of pain medication have the patient follow-up with her doctor.  Discussed return precautions. ? ?FINAL CLINICAL IMPRESSION(S) / ED DIAGNOSES  ? ?Right flank pain ? ?Rx / DC Orders  ?Tramadol ?Note:  This document was prepared using Dragon voice recognition software and may include unintentional dictation errors. ?  Harvest Dark, MD ?05/09/21 2226 ? ?

## 2021-05-09 NOTE — ED Triage Notes (Signed)
Pt presents to ER via EMS with complaints of right flank pain. Reports pain started today, reports history of kidney stones in the past. Pt denies any urinary symptoms. Pt reports some nausea, no vomiting denies any diarrhea. Pt talks in complete sentences no distress noted  ?

## 2021-06-02 ENCOUNTER — Telehealth: Payer: Self-pay | Admitting: *Deleted

## 2021-06-02 NOTE — Telephone Encounter (Signed)
? ?  Pre-operative Risk Assessment  ?  ?Patient Name: Morgan [name]  ?DOB: 07-Sep-1965 ?MRN: [id number]  ? ?  ? ?Request for Surgical Clearance   ? ?Procedure:   L3-4 TESI ? ?Date of Surgery:  Clearance TBD                              ?   ?Surgeon:  DR. Kristeen Miss ?Surgeon's Group or Practice Name:  [facility] ?Phone number:  [phone] ?Fax number:  [phone] ATTN: JESSICA ?  ?Type of Clearance Requested:   ?- Medical  ?- Pharmacy:  Hold Apixaban (Eliquis)   ?  ?Type of Anesthesia:   IV SEDATION ?  ?Additional requests/questions:   ? ?Signed, ?Julaine Hua   ?06/02/2021, 5:36 PM  ? ?

## 2021-06-04 NOTE — Telephone Encounter (Signed)
Left message for pt to call back to set up tele pre op appt. 

## 2021-06-04 NOTE — Telephone Encounter (Signed)
Will route to pharm. 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 ?

## 2021-06-04 NOTE — Telephone Encounter (Signed)
? ? ?  Name: Morgan [name]  ?DOB: 08-06-1965  ?MRN: [id number] ? ?Primary Cardiologist: Larae Grooms, MD ? 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 ? ?Preoperative team, please contact this patient and set up a phone call appointment for further preoperative risk assessment. Please obtain consent and complete medication review. Thank you for your help. ? ?I confirm that guidance regarding antiplatelet and oral anticoagulation therapy has been completed and, if necessary, noted below. ? ? ? ?[name], PA-C ?06/04/2021, 12:56 PM ?[facility] ?[address] ?[place], [place] [address] ? ? ?

## 2021-06-04 NOTE — Telephone Encounter (Signed)
Patient with diagnosis of afib on Eliquis for anticoagulation.   ? ?Procedure: L3-4 TESI ?Date of procedure: TBD ? ?CHA2DS2-VASc Score = 3  ?This indicates a 3.2% annual risk of stroke. ?The patient's score is based upon: ?CHF History: 0 ?HTN History: 1 ?Diabetes History: 1 ?Stroke History: 0 ?Vascular Disease History: 0 ?Age Score: 0 ?Gender Score: 1 ? ?CrCl 19m/min using adjusted body weight due to obesity ?Platelet count 193K ? ?Per office protocol, patient can hold Eliquis for 3 days prior to procedure.   ?

## 2021-06-05 ENCOUNTER — Telehealth: Payer: Self-pay | Admitting: *Deleted

## 2021-06-05 NOTE — Telephone Encounter (Signed)
Tele pre op appt 06/08/21 @ 1 pm. Med rec and consent are done.  ? ?  ?Patient Consent for Virtual Visit  ? ? ?   ? ?Morgan [name] has provided verbal consent on 06/05/2021 for a virtual visit (video or telephone). ? ? ?CONSENT FOR VIRTUAL VISIT FOR:  Morgan [name]  ?By participating in this virtual visit I agree to the following: ? ?I hereby voluntarily request, consent and authorize [facility] and its employed or contracted physicians, physician assistants, nurse practitioners or other licensed health care professionals (the Practitioner), to provide me with telemedicine health care services (the ?Services") as deemed necessary by the treating Practitioner. I acknowledge and consent to receive the Services by the Practitioner via telemedicine. I understand that the telemedicine visit will involve communicating with the Practitioner through live audiovisual communication technology and the disclosure of certain medical information by electronic transmission. I acknowledge that I have been given the opportunity to request an in-person assessment or other available alternative prior to the telemedicine visit and am voluntarily participating in the telemedicine visit. ? ?I understand that I have the right to withhold or withdraw my consent to the use of telemedicine in the course of my care at any time, without affecting my right to future care or treatment, and that the Practitioner or I may terminate the telemedicine visit at any time. I understand that I have the right to inspect all information obtained and/or recorded in the course of the telemedicine visit and may receive copies of available information for a reasonable fee.  I understand that some of the potential risks of receiving the Services via telemedicine include:  ?Delay or interruption in medical evaluation due to technological equipment failure or disruption; ?Information transmitted may not be sufficient (e.g. poor resolution of images) to  allow for appropriate medical decision making by the Practitioner; and/or  ?In rare instances, security protocols could fail, causing a breach of personal health information. ? ?Furthermore, I acknowledge that it is my responsibility to provide information about my medical history, conditions and care that is complete and accurate to the best of my ability. I acknowledge that Practitioner's advice, recommendations, and/or decision may be based on factors not within their control, such as incomplete or inaccurate data provided by me or distortions of diagnostic images or specimens that may result from electronic transmissions. I understand that the practice of medicine is not an exact science and that Practitioner makes no warranties or guarantees regarding treatment outcomes. I acknowledge that a copy of this consent can be made available to me via my patient portal ([facility]), or I can request a printed copy by calling the office of [facility].   ? ?I understand that my insurance will be billed for this visit.  ? ?I have read or had this consent read to me. ?I understand the contents of this consent, which adequately explains the benefits and risks of the Services being provided via telemedicine.  ?I have been provided ample opportunity to ask questions regarding this consent and the Services and have had my questions answered to my satisfaction. ?I give my informed consent for the services to be provided through the use of telemedicine in my medical care ? ? ? ?

## 2021-06-05 NOTE — Telephone Encounter (Signed)
Tele pre op appt 06/08/21 @ 1 pm. Med rec and consent are done.  ?

## 2021-06-07 ENCOUNTER — Other Ambulatory Visit: Payer: Self-pay | Admitting: Physician Assistant

## 2021-06-07 DIAGNOSIS — E1169 Type 2 diabetes mellitus with other specified complication: Secondary | ICD-10-CM

## 2021-06-08 ENCOUNTER — Ambulatory Visit (INDEPENDENT_AMBULATORY_CARE_PROVIDER_SITE_OTHER): Payer: Federal, State, Local not specified - PPO | Admitting: Physician Assistant

## 2021-06-08 DIAGNOSIS — Z01810 Encounter for preprocedural cardiovascular examination: Secondary | ICD-10-CM | POA: Diagnosis not present

## 2021-06-08 NOTE — Progress Notes (Signed)
? ?Virtual Visit via Telephone Note  ? ?This visit type was conducted due to national recommendations for restrictions regarding the COVID-19 Pandemic (e.g. social distancing) in an effort to limit this patient's exposure and mitigate transmission in our community.  Due to her co-morbid illnesses, this patient is at least at moderate risk for complications without adequate follow up.  This format is felt to be most appropriate for this patient at this time.  The patient did not have access to video technology/had technical difficulties with video requiring transitioning to audio format only (telephone).  All issues noted in this document were discussed and addressed.  No physical exam could be performed with this format.  Please refer to the patient's chart for her  consent to telehealth for [facility]. ? ?Evaluation Performed:  Preoperative cardiovascular risk assessment ?_____________  ? ?Date:  06/08/2021  ? ?Patient ID:  Morgan [name], DOB [birth year], MRN [id number] ?Patient Location:  ?Home ?Provider location:   ?Office ? ?Primary Care Provider:  Lorrene Reid, PA-C ?Primary Cardiologist:  Larae Grooms, MD ? ?Chief Complaint  ?  ?56 y.o. y/o female with a h/o atrial flutter/fibrillation, chronic anticoagulation, HTN, DM, who is pending ESI, and presents today for telephonic preoperative cardiovascular risk assessment. ? ?Past Medical History  ?  ?Past Medical History:  ?Diagnosis Date  ? Anxiety   ? Atrial fibrillation ([place])   ? Bipolar disorder ([place])   ? Breast mass   ? right, removed and benign  ? Cancer ([place]) 09/16/2019  ? ovarian ca no radiation no chemo  ? Cervical dysplasia   ? Diabetes mellitus without complication ([place])   ? Endometriosis   ? GERD (gastroesophageal reflux disease)   ? Hypercholesteremia   ? Hypertension   ? Migraine   ? Pelvic kidney   ? left  ? Postpartum depression   ? hx of  ? ?Past Surgical History:  ?Procedure Laterality Date  ? ABDOMINAL HYSTERECTOMY N/A   ? Phreesia  07/30/2019  ? BACK SURGERY  02/16/2012  ? BREAST BIOPSY Right 2016  ? BREAST BIOPSY Right 2020  ? BREAST EXCISIONAL BIOPSY Right 2021  ? BREAST LUMPECTOMY WITH RADIOACTIVE SEED LOCALIZATION Right 10/28/2014  ? Procedure: RIGHT BREAST LUMPECTOMY WITH RADIOACTIVE SEED LOCALIZATION;  Surgeon: [name], MD;  Location: [facility];  Service: General;  Laterality: Right;  ? BREAST LUMPECTOMY WITH RADIOACTIVE SEED LOCALIZATION Right 06/18/2019  ? Procedure: RIGHT BREAST RADIOACTIVE SEED LOCALIZATION LUMPECTOMY;  Surgeon: Jovita Kussmaul, MD;  Location: [facility];  Service: General;  Laterality: Right;  ? BREAST SURGERY N/A   ? Phreesia 07/30/2019  ? CESAREAN SECTION  02/15/2001  ? COLONOSCOPY WITH PROPOFOL N/A 05/01/2018  ? Procedure: COLONOSCOPY WITH PROPOFOL;  Surgeon: [name], MD;  Location[facility] ENDOSCOPY;  Service: Gastroenterology;  Laterality: N/A;  ? COLPOSCOPY    ? ESOPHAGOGASTRODUODENOSCOPY (EGD) WITH PROPOFOL N/A 05/01/2018  ? Procedure: ESOPHAGOGASTRODUODENOSCOPY (EGD) WITH PROPOFOL;  Surgeon: [name], MD;  Location[facility] END[facility]COPY;  Service: Gastroenterology;  Laterality: N/A;  ? ESOPHAGOGASTRODUODENOSCOPY (EGD) WITH PROPOFOL N/A 11/13/2018  ? Procedure: ESOPHAGOGASTRODUODENOSCOPY (EGD) WITH PROPOFOL;  Surgeon: [name], MD;  Location: [facility] ENDOSCOPY;  Service: Gastroenterology;  Laterality: N/A;  ? GYNECOLOGIC CRYOSURGERY    ? NECK SURGERY  02/15/2010  ? PELVIC LAPAROSCOPY  02/15/1989  ? ? ?Allergies ? ?Allergies  ?Allergen Reactions  ? Clindamycin/Lincomycin Rash  ?  Diffuse drug reaction eruption  ? Other Hives, Rash and Itching  ? Lamotrigine Rash  ? ? ?  History of Present Illness  ?  ?Morgan [name] is a 56 y.o. female who presents via [employer] (consulting) for a telehealth visit today.  Pt was last seen in cardiology clinic on 12/31/20 by Malka So PAC.  At that time [employer] was doing well.  The patient is now pending ESI.  Since her last  visit, she has had an ER visit for chest pain in March 2023, felt to be noncardiac. I reviewed this with her, no recurrence of chest pain. She had a reassuring CT coronary in 2019. She is able to complete 4.0 METS without angina despite back pain (stairs, grocery store). She does not have any unstable cardiac problems. ? ? ?Home Medications  ?  ?Prior to Admission medications   ?Medication Sig Start Date End Date Taking? Authorizing Provider  ?acetaminophen (TYLENOL) 500 MG tablet Take 1,000 mg by mouth as needed. Takes 2-3 times weekly    [provider]  ?ALPRAZolam Duanne Moron) 1 MG tablet Take 1 mg by mouth daily as needed. 05/08/21   [provider]  ?apixaban (ELIQUIS) 5 MG TABS tablet Take 1 tablet (5 mg total) by mouth 2 (two) times daily. 11/03/20   Allred, Jeneen Rinks, MD  ?ARIPiprazole (ABILIFY) 15 MG tablet Take 15 mg by mouth at bedtime. 12/21/17   Noemi Chapel, NP  ?atorvastatin (LIPITOR) 40 MG tablet Take 1 tablet (40 mg total) by mouth daily. 04/07/21   Lorrene Reid, PA-C  ?clonazePAM (KLONOPIN) 1 MG tablet Take 1 mg by mouth daily as needed for anxiety. ?Patient not taking: Reported on 06/05/2021 07/09/19   [provider]  ?diltiazem (CARDIZEM CD) 120 MG 24 hr capsule Take 1 capsule (120 mg total) by mouth daily. ?Patient not taking: Reported on 06/05/2021 11/03/20   Thompson Grayer, MD  ?diltiazem (CARTIA XT) 120 MG 24 hr capsule Take 120 mg by mouth daily.    [provider]  ?DULoxetine (CYMBALTA) 60 MG capsule Take 60 mg by mouth at bedtime. 11/04/16   [provider]  ?glipiZIDE (GLUCOTROL) 5 MG tablet Take 1 tablet (5 mg total) by mouth daily before supper. ?Patient not taking: Reported on 06/05/2021 04/07/21   Lorrene Reid, PA-C  ?insulin aspart (NOVOLOG) 100 UNIT/ML injection Inject 10 Units into the skin 3 (three) times daily before meals. 08/26/20   Lorrene Reid, PA-C  ?losartan (COZAAR) 100 MG tablet Take 1 tablet (100 mg total) by mouth daily. Take 1 tablet  daily by mouth. 04/07/21   Lorrene Reid, PA-C  ?NOVOLOG FLEXPEN 100 UNIT/ML FlexPen INJECT 10 UNITS SUBCUTANEOUSLY 3 TIMES A DAY 02/26/21   Lorrene Reid, PA-C  ?omeprazole (PRILOSEC) 20 MG capsule Take 20 mg by mouth daily.    [provider]  ?Semaglutide,0.25 or 0.'5MG'$ /DOS, (OZEMPIC, 0.25 OR 0.5 MG/DOSE,) 2 MG/1.5ML SOPN Inject 0.25 mg into the skin once weekly x 4 weeks. Then inject 0.5 mg into the skin once weekly. 04/07/21   Lorrene Reid, PA-C  ?tiZANidine (ZANAFLEX) 4 MG tablet Take 1 tablet by mouth at bedtime as needed. 05/14/21   [provider]  ?traMADol (ULTRAM) 50 MG tablet Take 1 tablet (50 mg total) by mouth every 6 (six) hours as needed. ?Patient not taking: Reported on 06/05/2021 05/09/21   Harvest Dark, MD  ?XIGDUO XR 11-998 MG TB24 TAKE 1 TABLET BY MOUTH EVERY DAY 06/08/21   Lorrene Reid, PA-C  ? ? ?Physical Exam  ?  ?Vital Signs:  Philicia R [place] does not have vital signs available for review today. ? ?  Given telephonic nature of communication, physical exam is limited. ?AAOx3. NAD. Normal affect.  Speech and respirations are unlabored. ? ?Accessory Clinical Findings  ?  ?None ? ?Assessment & Plan  ?  ?1.  Preoperative Cardiovascular Risk Assessment: ? ?She does not have a history of ischemic heart disease. I reviewed her ER visit in March for chest pain, felt to be noncardiac. She has not had a recurrence of chest pain. Reassuring CT coronary in 2019. She is able to complete 4.0 METS (stairs, grocery store) without angina. She requires insulin and has a 0.9% risk of MACE. ? ?Therefore, based on ACC/AHA guidelines, the patient would be at acceptable risk for the planned procedure without further cardiovascular testing.  ? ?The patient was advised that if she develops new symptoms prior to surgery to contact our office to arrange for a follow-up visit, and she verbalized understanding.  ? ? ?Per office protocol, patient can hold Eliquis for 3 days prior to  procedure. ? ? ? ?A copy of this note will be routed to requesting surgeon. ? ?Time:   ?Today, I have spent 8 minutes with the patient with telehealth technology discussing medical history, symptoms, and management pla

## 2021-06-23 NOTE — Patient Instructions (Signed)
Fatigue ?If you have fatigue, you feel tired all the time and have a lack of energy or a lack of motivation. Fatigue may make it difficult to start or complete tasks because of exhaustion. ?Occasional or mild fatigue is often a normal response to activity or life. However, long-term (chronic) or extreme fatigue may be a symptom of a medical condition such as: ?Depression. ?Not having enough red blood cells or hemoglobin in the blood (anemia). ?A problem with a small gland located in the lower front part of the neck (thyroid disorder). ?Rheumatologic conditions. These are problems related to the body's defense system (immune system). ?Infections, especially certain viral infections. ?Fatigue can also lead to negative health outcomes over time. ?Follow these instructions at home: ?Medicines ?Take over-the-counter and prescription medicines only as told by your health care provider. ?Take a multivitamin if told by your health care provider. ?Do not use herbal or dietary supplements unless they are approved by your health care provider. ?Eating and drinking ? ?Avoid heavy meals in the evening. ?Eat a well-balanced diet, which includes lean proteins, whole grains, plenty of fruits and vegetables, and low-fat dairy products. ?Avoid eating or drinking too many products with caffeine in them. ?Avoid alcohol. ?Drink enough fluid to keep your urine pale yellow. ?Activity ? ?Exercise regularly, as told by your health care provider. ?Use or practice techniques to help you relax, such as yoga, tai chi, meditation, or massage therapy. ?Lifestyle ?Change situations that cause you stress. Try to keep your work and personal schedules in balance. ?Do not use recreational or illegal drugs. ?General instructions ?Monitor your fatigue for any changes. ?Go to bed and get up at the same time every day. ?Avoid fatigue by pacing yourself during the day and getting enough sleep at night. ?Maintain a healthy weight. ?Contact a health care  provider if: ?Your fatigue does not get better. ?You have a fever. ?You suddenly lose or gain weight. ?You have headaches. ?You have trouble falling asleep or sleeping through the night. ?You feel angry, guilty, anxious, or sad. ?You have swelling in your legs or another part of your body. ?Get help right away if: ?You feel confused, feel like you might faint, or faint. ?Your vision is blurry or you have a severe headache. ?You have severe pain in your abdomen, your back, or the area between your waist and hips (pelvis). ?You have chest pain, shortness of breath, or an irregular or fast heartbeat. ?You are unable to urinate, or you urinate less than normal. ?You have abnormal bleeding from the rectum, nose, lungs, nipples, or, if you are female, the vagina. ?You vomit blood. ?You have thoughts about hurting yourself or others. ?These symptoms may be an emergency. Get help right away. Call 911. ?Do not wait to see if the symptoms will go away. ?Do not drive yourself to the hospital. ?Get help right away if you feel like you may hurt yourself or others, or have thoughts about taking your own life. Go to your nearest emergency room or: ?Call 911. ?Call the National Suicide Prevention Lifeline at 1-800-273-8255 or 988. This is open 24 hours a day. ?Text the Crisis Text Line at 741741. ?Summary ?If you have fatigue, you feel tired all the time and have a lack of energy or a lack of motivation. ?Fatigue may make it difficult to start or complete tasks because of exhaustion. ?Long-term (chronic) or extreme fatigue may be a symptom of a medical condition. ?Exercise regularly, as told by your health care provider. ?  Change situations that cause you stress. Try to keep your work and personal schedules in balance. ?This information is not intended to replace advice given to you by your health care provider. Make sure you discuss any questions you have with your health care provider. ?Document Revised: 11/24/2020 Document  Reviewed: 11/24/2020 ?Elsevier Patient Education ? 2023 Elsevier Inc. ? ?

## 2021-06-24 ENCOUNTER — Ambulatory Visit: Payer: Federal, State, Local not specified - PPO | Admitting: Physician Assistant

## 2021-06-24 ENCOUNTER — Encounter: Payer: Self-pay | Admitting: Physician Assistant

## 2021-06-24 VITALS — BP 90/57 | HR 87 | Temp 97.7°F | Ht 61.0 in | Wt 188.0 lb

## 2021-06-24 DIAGNOSIS — R5383 Other fatigue: Secondary | ICD-10-CM

## 2021-06-24 DIAGNOSIS — M791 Myalgia, unspecified site: Secondary | ICD-10-CM

## 2021-06-24 NOTE — Progress Notes (Signed)
?Established patient acute visit ? ? ?Patient: Morgan [name]   DOB: 08-12-1965   56 y.o. Female  MRN: [id number] ?Visit Date: 06/24/2021 ? ?Chief Complaint  ?Patient presents with  ? Fatigue  ? Weakness  ? ?Subjective  ?  ?HPI  ?Patient presents with c/o fatigue and states her whole body hurts which feels more muscular vs joint x 2 months. Denies starting new medications or supplements. Her legs feels weak a lot too. Denies syncope, cough, chills, high stress, mood changes or claudication. Reports intermittent chest pain which she describes as an ache and has been to ED for evaluation. Drinks 2 bottles of water daily (16.9 fl oz) and 3 sodas of 12 oz can. States her sugars have been 110-115.  ? ? ? ? ?Medications: ?Outpatient Medications Prior to Visit  ?Medication Sig  ? acetaminophen (TYLENOL) 500 MG tablet Take 1,000 mg by mouth as needed. Takes 2-3 times weekly  ? ALPRAZolam (XANAX) 1 MG tablet Take 1 mg by mouth daily as needed.  ? ALPRAZOLAM XR 1 MG 24 hr tablet Take 1 mg by mouth every morning.  ? apixaban (ELIQUIS) 5 MG TABS tablet Take 1 tablet (5 mg total) by mouth 2 (two) times daily.  ? ARIPiprazole (ABILIFY) 15 MG tablet Take 15 mg by mouth at bedtime.  ? atorvastatin (LIPITOR) 40 MG tablet Take 1 tablet (40 mg total) by mouth daily.  ? clonazePAM (KLONOPIN) 1 MG tablet Take 1 mg by mouth daily as needed for anxiety.  ? diltiazem (CARDIZEM CD) 120 MG 24 hr capsule Take 1 capsule (120 mg total) by mouth daily.  ? diltiazem (CARDIZEM CD) 120 MG 24 hr capsule Take 120 mg by mouth daily.  ? DULoxetine (CYMBALTA) 60 MG capsule Take 60 mg by mouth at bedtime.  ? glipiZIDE (GLUCOTROL) 5 MG tablet Take 1 tablet (5 mg total) by mouth daily before supper.  ? insulin aspart (NOVOLOG) 100 UNIT/ML injection Inject 10 Units into the skin 3 (three) times daily before meals.  ? losartan (COZAAR) 100 MG tablet Take 1 tablet (100 mg total) by mouth daily. Take 1 tablet daily by mouth.  ? NOVOLOG FLEXPEN 100 UNIT/ML  FlexPen INJECT 10 UNITS SUBCUTANEOUSLY 3 TIMES A DAY  ? omeprazole (PRILOSEC) 20 MG capsule Take 20 mg by mouth daily.  ? Semaglutide,0.25 or 0.'5MG'$ /DOS, (OZEMPIC, 0.25 OR 0.5 MG/DOSE,) 2 MG/1.5ML SOPN Inject 0.25 mg into the skin once weekly x 4 weeks. Then inject 0.5 mg into the skin once weekly.  ? tiZANidine (ZANAFLEX) 4 MG tablet Take 1 tablet by mouth at bedtime as needed.  ? traMADol (ULTRAM) 50 MG tablet Take 1 tablet (50 mg total) by mouth every 6 (six) hours as needed.  ? XIGDUO XR 11-998 MG TB24 TAKE 1 TABLET BY MOUTH EVERY DAY  ? ?No facility-administered medications prior to visit.  ? ? ?Review of Systems ?Review of Systems:  ?A fourteen system review of systems was performed and found to be positive as per HPI. ? ? ?  Objective  ?  ?BP (!) 90/57   Pulse 87   Temp 97.7 ?F (36.5 ?C)   Ht '5\' 1"'$  (1.549 m)   Wt 188 lb (85.3 kg)   LMP 05/12/2011   SpO2 99%   BMI 35.52 kg/m?  ? ? ?Physical Exam  ?General:  In no acute distress, non-diaphoretic, appropriate for stated age.  ?Neuro:  Alert and oriented,  extra-ocular muscles intact  ?HEENT:  Normocephalic, atraumatic, neck supple  ?Skin:  no gross rash, warm, pink. ?Cardiac:  RRR, S1 S2 ?Respiratory: CTA B/L w/o wheezing, crackles or rales.  ?MSK: diffuse tenderness of upper and lower extremity, no step off or deformity, overall good ROM, slight decreased strength of left lower extremity when compared to the right ?Vascular:  Ext warm, no cyanosis apprec.; cap RF less 2 sec. No edema.  ?Psych:  No HI/SI, judgement and insight good, Euthymic mood. Full Affect. ? ? ?No results found for any visits on 06/24/21. ? Assessment & Plan  ?  ? ?Etiology unclear. Will place lab orders to evaluate for nutritional deficiency, endocrine etiology, electrolyte imbalance, signs of inflammatory or autoimmune condition. Discussed with patient if labs unremarkable, then recommend trial of holding statin medication to evaluate for medication side effects contributing to  symptoms. Patient verbalized understanding. Another diagnosis on the differential is fibromyalgia. Recommend to increase water intake and reduce soda intake.  ? ? ?Return if symptoms worsen or fail to improve.  ?   ? ? ? ?Lorrene Reid, PA-C  ?[facility] ?[phone] (phone) ?[phone] (fax) ? ?[facility] ?

## 2021-06-25 LAB — COMPREHENSIVE METABOLIC PANEL
ALT: 25 IU/L (ref 0–32)
AST: 23 IU/L (ref 0–40)
Albumin/Globulin Ratio: 1.6 (ref 1.2–2.2)
Albumin: 4.3 g/dL (ref 3.8–4.9)
Alkaline Phosphatase: 163 IU/L — ABNORMAL HIGH (ref 44–121)
BUN/Creatinine Ratio: 13 (ref 9–23)
BUN: 10 mg/dL (ref 6–24)
Bilirubin Total: 0.5 mg/dL (ref 0.0–1.2)
CO2: 23 mmol/L (ref 20–29)
Calcium: 9.8 mg/dL (ref 8.7–10.2)
Chloride: 105 mmol/L (ref 96–106)
Creatinine, Ser: 0.75 mg/dL (ref 0.57–1.00)
Globulin, Total: 2.7 g/dL (ref 1.5–4.5)
Glucose: 119 mg/dL — ABNORMAL HIGH (ref 70–99)
Potassium: 4.2 mmol/L (ref 3.5–5.2)
Sodium: 143 mmol/L (ref 134–144)
Total Protein: 7 g/dL (ref 6.0–8.5)
eGFR: 95 mL/min/{1.73_m2} (ref >59)

## 2021-06-25 LAB — CBC WITH DIFFERENTIAL/PLATELET
Basophils Absolute: 0.1 10*3/uL (ref 0.0–0.2)
Basos: 1 %
EOS (ABSOLUTE): 0.3 10*3/uL (ref 0.0–0.4)
Eos: 4 %
Hematocrit: 43.3 % (ref 34.0–46.6)
Hemoglobin: 14 g/dL (ref 11.1–15.9)
Immature Grans (Abs): 0 10*3/uL (ref 0.0–0.1)
Immature Granulocytes: 0 %
Lymphocytes Absolute: 2 10*3/uL (ref 0.7–3.1)
Lymphs: 27 %
MCH: 25.4 pg — ABNORMAL LOW (ref 26.6–33.0)
MCHC: 32.3 g/dL (ref 31.5–35.7)
MCV: 78 fL — ABNORMAL LOW (ref 79–97)
Monocytes Absolute: 0.2 10*3/uL (ref 0.1–0.9)
Monocytes: 3 %
Neutrophils Absolute: 5 10*3/uL (ref 1.4–7.0)
Neutrophils: 65 %
Platelets: 264 10*3/uL (ref 150–450)
RBC: 5.52 x10E6/uL — ABNORMAL HIGH (ref 3.77–5.28)
RDW: 15.6 % — ABNORMAL HIGH (ref 11.7–15.4)
WBC: 7.6 10*3/uL (ref 3.4–10.8)

## 2021-06-25 LAB — C-REACTIVE PROTEIN: CRP: 22 mg/L — ABNORMAL HIGH (ref 0–10)

## 2021-06-25 LAB — TSH: TSH: 1.25 u[IU]/mL (ref 0.450–4.500)

## 2021-06-25 LAB — B12 AND FOLATE PANEL
Folate: 9.5 ng/mL (ref >3.0)
Vitamin B-12: 483 pg/mL (ref 232–1245)

## 2021-06-25 LAB — ANA W/REFLEX IF POSITIVE: Anti Nuclear Antibody (ANA): NEGATIVE

## 2021-06-25 LAB — RHEUMATOID FACTOR: Rheumatoid fact SerPl-aCnc: <10 IU/mL (ref <14.0)

## 2021-06-25 LAB — SEDIMENTATION RATE: Sed Rate: 29 mm/hr (ref 0–40)

## 2021-07-01 ENCOUNTER — Telehealth: Payer: Self-pay | Admitting: Physician Assistant

## 2021-07-01 ENCOUNTER — Ambulatory Visit (HOSPITAL_COMMUNITY)
Admission: RE | Admit: 2021-07-01 | Discharge: 2021-07-01 | Disposition: A | Discharge status: Home / Self Care | Payer: Federal, State, Local not specified - PPO | Source: Ambulatory Visit | Attending: Physician Assistant | Admitting: Physician Assistant

## 2021-07-01 ENCOUNTER — Telehealth: Payer: Self-pay | Admitting: Oncology

## 2021-07-01 ENCOUNTER — Encounter (HOSPITAL_COMMUNITY): Payer: Self-pay | Admitting: Physician Assistant

## 2021-07-01 VITALS — BP 106/70 | HR 76 | Ht 61.0 in | Wt 188.0 lb

## 2021-07-01 DIAGNOSIS — Z7901 Long term (current) use of anticoagulants: Secondary | ICD-10-CM | POA: Diagnosis not present

## 2021-07-01 DIAGNOSIS — E119 Type 2 diabetes mellitus without complications: Secondary | ICD-10-CM | POA: Diagnosis not present

## 2021-07-01 DIAGNOSIS — E669 Obesity, unspecified: Secondary | ICD-10-CM | POA: Diagnosis not present

## 2021-07-01 DIAGNOSIS — D6869 Other thrombophilia: Secondary | ICD-10-CM | POA: Insufficient documentation

## 2021-07-01 DIAGNOSIS — I10 Essential (primary) hypertension: Secondary | ICD-10-CM | POA: Insufficient documentation

## 2021-07-01 DIAGNOSIS — Z6835 Body mass index (BMI) 35.0-35.9, adult: Secondary | ICD-10-CM | POA: Diagnosis not present

## 2021-07-01 DIAGNOSIS — R5383 Other fatigue: Secondary | ICD-10-CM

## 2021-07-01 DIAGNOSIS — M791 Myalgia, unspecified site: Secondary | ICD-10-CM

## 2021-07-01 DIAGNOSIS — I4892 Unspecified atrial flutter: Secondary | ICD-10-CM | POA: Insufficient documentation

## 2021-07-01 DIAGNOSIS — I480 Paroxysmal atrial fibrillation: Secondary | ICD-10-CM | POA: Insufficient documentation

## 2021-07-01 DIAGNOSIS — R7989 Other specified abnormal findings of blood chemistry: Secondary | ICD-10-CM

## 2021-07-01 NOTE — Telephone Encounter (Signed)
Patient received a Mychart message from [facility] regarding her CBC and she does want to move forward with the hematology referral. Please advise.  ?

## 2021-07-01 NOTE — Telephone Encounter (Signed)
Called 1X, no answer so voicemail was left for patient to call back, need to schedule initial visit from referral  ?

## 2021-07-01 NOTE — Telephone Encounter (Signed)
Referral has been placed. AS, CMA 

## 2021-07-01 NOTE — Progress Notes (Signed)
? ? ?Primary Care Physician: Lorrene Reid, PA-C ?Primary Cardiologist: Dr Irish Lack  ?Primary Electrophysiologist: Dr Rayann Heman ?Referring Physician: Dr Rayann Heman ? ? ?Morgan [name] is a 56 y.o. female with a history of HTN, atrial flutter, DM, and atrial fibrillation who presents for follow up in the [facility]. She was seen by him 07/08/20 and complained of intermittent palpitations.  She had a zio monitor placed.  This recorded rare PACs, PVCs as well as afib with burden of <1%.  The longest event was 49 min and 13 seconds.  There was also a more regular arrhythmia which was likely atrial flutter. Seen by Dr Rayann Heman on 11/03/20 and started on Eliquis for a CHADS2VASC score of 3 and diltiazem. ? ?On follow up today, patient reports that she has done well since her last visit. She has had two brief episodes of palpitations which resolved < 2 minutes. No bleeding issues on anticoagulation.  ? ?Today, she denies symptoms of chest pain, shortness of breath, orthopnea, PND, lower extremity edema, dizziness, presyncope, syncope, snoring, daytime somnolence, bleeding, or neurologic sequela. The patient is tolerating medications without difficulties and is otherwise without complaint today.  ? ? ?Atrial Fibrillation Risk Factors: ? ?she does not have symptoms or diagnosis of sleep apnea. ?she does not have a history of rheumatic fever. ?she does not have a history of alcohol use. ? ? ?she has a BMI of Body mass index is 35.52 kg/m?Marland KitchenMarland Kitchen ?Filed Weights  ? 07/01/21 1525  ?Weight: 85.3 kg  ? ? ? ?Family History  ?Problem Relation Age of Onset  ? Diabetes Mother   ? Breast cancer Mother 58  ? Thyroid disease Mother   ? Depression Mother   ? Hypertension Father   ? Heart disease Father   ? Heart attack Father 67  ?     Two instances, first at age 86  ? Stroke Father   ? Alcohol abuse Father   ? Throat cancer Father   ? Diabetes Maternal Grandmother   ? Depression Maternal Grandmother   ? Uterine cancer  Maternal Grandmother   ? ? ? ?Atrial Fibrillation Management history: ? ?Previous antiarrhythmic drugs: none ?Previous cardioversions: none ?Previous ablations: none ?CHADS2VASC score: 3 ?Anticoagulation history: Eliquis ? ? ?Past Medical History:  ?Diagnosis Date  ? Anxiety   ? Atrial fibrillation ([place])   ? Bipolar disorder ([place])   ? Breast mass   ? right, removed and benign  ? Cancer ([place]) 09/16/2019  ? ovarian ca no radiation no chemo  ? Cervical dysplasia   ? Diabetes mellitus without complication ([place])   ? Endometriosis   ? GERD (gastroesophageal reflux disease)   ? Hypercholesteremia   ? Hypertension   ? Migraine   ? Pelvic kidney   ? left  ? Postpartum depression   ? hx of  ? ?Past Surgical History:  ?Procedure Laterality Date  ? ABDOMINAL HYSTERECTOMY N/A   ? Phreesia 07/30/2019  ? BACK SURGERY  02/16/2012  ? BREAST BIOPSY Right 2016  ? BREAST BIOPSY Right 2020  ? BREAST EXCISIONAL BIOPSY Right 2021  ? BREAST LUMPECTOMY WITH RADIOACTIVE SEED LOCALIZATION Right 10/28/2014  ? Procedure: RIGHT BREAST LUMPECTOMY WITH RADIOACTIVE SEED LOCALIZATION;  Surgeon: [name], MD;  Location: [facility];  Service: General;  Laterality: Right;  ? BREAST LUMPECTOMY WITH RADIOACTIVE SEED LOCALIZATION Right 06/18/2019  ? Procedure: RIGHT BREAST RADIOACTIVE SEED LOCALIZATION LUMPECTOMY;  Surgeon: Jovita Kussmaul, MD;  Location: [facility];  Service: General;  Laterality: Right;  ? BREAST SURGERY N/A   ? Phreesia 07/30/2019  ? CESAREAN SECTION  02/15/2001  ? COLONOSCOPY WITH PROPOFOL N/A 05/01/2018  ? Procedure: COLONOSCOPY WITH PROPOFOL;  Surgeon: [name], MD;  Location: [facility] ENDOSCOPY;  Service: Gastroenterology;  Laterality: N/A;  ? COLPOSCOPY    ? ESOPHAGOGASTRODUODENOSCOPY (EGD) WITH PROPOFOL N/A 05/01/2018  ? Procedure: ESOPHAGOGASTRODUODENOSCOPY (EGD) WITH PROPOFOL;  Surgeon: [name], MD;  Location: [facility] ENDOSCOPY;  Service: Gastroenterology;  Laterality: N/A;  ?  ESOPHAGOGASTRODUODENOSCOPY (EGD) WITH PROPOFOL N/A 11/13/2018  ? Procedure: ESOPHAGOGASTRODUODENOSCOPY (EGD) WITH PROPOFOL;  Surgeon: [name], MD;  Location: [facility] ENDOSCOPY;  Service: Gastroenterology;  Laterality: N/A;  ? GYNECOLOGIC CRYOSURGERY    ? NECK SURGERY  02/15/2010  ? PELVIC LAPAROSCOPY  02/15/1989  ? ? ?Current Outpatient Medications  ?Medication Sig Dispense Refill  ? acetaminophen (TYLENOL) 500 MG tablet Take 1,000 mg by mouth as needed. Takes 2-3 times weekly    ? ALPRAZolam (XANAX) 1 MG tablet Take 1 mg by mouth daily as needed.    ? ALPRAZOLAM XR 1 MG 24 hr tablet Take 1 mg by mouth every morning.    ? apixaban (ELIQUIS) 5 MG TABS tablet Take 1 tablet (5 mg total) by mouth 2 (two) times daily. 180 tablet 3  ? ARIPiprazole (ABILIFY) 15 MG tablet Take 15 mg by mouth at bedtime.    ? atorvastatin (LIPITOR) 40 MG tablet Take 1 tablet (40 mg total) by mouth daily. 90 tablet 1  ? clonazePAM (KLONOPIN) 1 MG tablet Take 1 mg by mouth daily as needed for anxiety.    ? diltiazem (CARDIZEM CD) 120 MG 24 hr capsule Take 1 capsule (120 mg total) by mouth daily. 90 capsule 3  ? DULoxetine (CYMBALTA) 60 MG capsule Take 60 mg by mouth at bedtime.  5  ? glipiZIDE (GLUCOTROL) 5 MG tablet Take 1 tablet (5 mg total) by mouth daily before supper. 90 tablet 1  ? insulin aspart (NOVOLOG) 100 UNIT/ML injection Inject 10 Units into the skin 3 (three) times daily before meals. 10 mL 1  ? losartan (COZAAR) 100 MG tablet Take 1 tablet (100 mg total) by mouth daily. Take 1 tablet daily by mouth. 90 tablet 1  ? NOVOLOG FLEXPEN 100 UNIT/ML FlexPen INJECT 10 UNITS SUBCUTANEOUSLY 3 TIMES A DAY 15 mL 1  ? omeprazole (PRILOSEC) 20 MG capsule Take 20 mg by mouth daily.    ? Semaglutide,0.25 or 0.'5MG'$ /DOS, (OZEMPIC, 0.25 OR 0.5 MG/DOSE,) 2 MG/1.5ML SOPN Inject 0.25 mg into the skin once weekly x 4 weeks. Then inject 0.5 mg into the skin once weekly. 3 mL 3  ? tiZANidine (ZANAFLEX) 4 MG tablet Take 1 tablet by mouth at bedtime as  needed.    ? traMADol (ULTRAM) 50 MG tablet Take 1 tablet (50 mg total) by mouth every 6 (six) hours as needed. 10 tablet 0  ? XIGDUO XR 11-998 MG TB24 TAKE 1 TABLET BY MOUTH EVERY DAY 90 tablet 0  ? ?No current facility-administered medications for this encounter.  ? ? ?Allergies  ?Allergen Reactions  ? Clindamycin/Lincomycin Rash  ?  Diffuse drug reaction eruption  ? Other Hives, Rash and Itching  ? Lamotrigine Rash  ? ? ?Social History  ? ?Socioeconomic History  ? Marital status: Married  ?  Spouse name: Not on file  ? Number of children: Not on file  ? Years of education: Not on file  ? Highest education level: Not on file  ?Occupational History  ?  Not on file  ?Tobacco Use  ? Smoking status: Never  ? Smokeless tobacco: Never  ? Tobacco comments:  ?  Never smoke 07/01/21  ?Vaping Use  ? Vaping Use: Never used  ?Substance and Sexual Activity  ? Alcohol use: No  ? Drug use: No  ? Sexual activity: Yes  ?  Partners: Male  ?  Birth control/protection: None  ?  Comment: vasectomy  ?Other Topics Concern  ? Not on file  ?Social History Narrative  ? Lives in [place] with spouse and son.  ? Disabled.  ? ?Social Determinants of Health  ? ?Financial Resource Strain: Not on file  ?Food Insecurity: Not on file  ?Transportation Needs: Not on file  ?Physical Activity: Not on file  ?Stress: Not on file  ?Social Connections: Not on file  ?Intimate Partner Violence: Not on file  ? ? ? ?ROS- All systems are reviewed and negative except as per the HPI above. ? ?Physical Exam: ?Vitals:  ? 07/01/21 1525  ?BP: 106/70  ?Pulse: 76  ?Weight: 85.3 kg  ?Height: '5\' 1"'$  (1.549 m)  ? ? ? ?GEN- The patient is a well appearing obese female, alert and oriented x 3 today.   ?HEENT-head normocephalic, atraumatic, sclera clear, conjunctiva pink, hearing intact, trachea midline. ?Lungs- Clear to ausculation bilaterally, normal work of breathing ?Heart- Regular rate and rhythm, no murmurs, rubs or gallops  ?GI- soft, NT, ND, + BS ?Extremities- no  clubbing, cyanosis, or edema ?MS- no significant deformity or atrophy ?Skin- no rash or lesion ?Psych- euthymic mood, full affect ?Neuro- strength and sensation are intact ? ? ?Wt Readings from Last 3 Encounters:  ?07/01/21

## 2021-07-31 ENCOUNTER — Ambulatory Visit
Admission: RE | Admit: 2021-07-31 | Discharge: 2021-07-31 | Disposition: A | Discharge status: Home / Self Care | Payer: Federal, State, Local not specified - PPO | Source: Ambulatory Visit | Attending: Internal Medicine | Admitting: Internal Medicine

## 2021-07-31 ENCOUNTER — Other Ambulatory Visit: Payer: Self-pay | Admitting: Nurse Practitioner

## 2021-07-31 VITALS — BP 124/81 | HR 79 | Temp 97.5°F | Resp 18

## 2021-07-31 DIAGNOSIS — R350 Frequency of micturition: Secondary | ICD-10-CM | POA: Insufficient documentation

## 2021-07-31 DIAGNOSIS — R300 Dysuria: Secondary | ICD-10-CM | POA: Diagnosis present

## 2021-07-31 DIAGNOSIS — N3001 Acute cystitis with hematuria: Secondary | ICD-10-CM | POA: Diagnosis present

## 2021-07-31 DIAGNOSIS — R7989 Other specified abnormal findings of blood chemistry: Secondary | ICD-10-CM

## 2021-07-31 LAB — POCT URINALYSIS DIP (MANUAL ENTRY)
Bilirubin, UA: NEGATIVE
Glucose, UA: >=1000 mg/dL — AB
Ketones, POC UA: NEGATIVE mg/dL
Nitrite, UA: NEGATIVE
Protein Ur, POC: >=300 mg/dL — AB
Spec Grav, UA: 1.025 (ref 1.010–1.025)
Urobilinogen, UA: 0.2 E.U./dL
pH, UA: 5.5 (ref 5.0–8.0)

## 2021-07-31 MED ORDER — CEPHALEXIN 500 MG PO CAPS: 500.0000 mg | ORAL_CAPSULE | Freq: Four times a day (QID) | ORAL | 0 refills | Status: DC

## 2021-07-31 NOTE — ED Triage Notes (Signed)
Pt is present today with dysuria, urinary frequency, and urinary urgency. Pt sx started yesterday

## 2021-07-31 NOTE — ED Provider Notes (Signed)
[facility]    CSN: [id number] Arrival date & time: 07/31/21  1042      History   Chief Complaint Chief Complaint  Patient presents with   Urinary Frequency    Believe I have uti - Entered by patient   Dysuria    HPI Morgan [name] is a 56 y.o. female.   Patient presents with urinary burning, urinary frequency, urinary urgency that started yesterday.  Denies any associated vaginal discharge, hematuria, abdominal pain, fever, back pain.  Denies any concern or exposure to STD.   Urinary Frequency  Dysuria   Past Medical History:  Diagnosis Date   Anxiety    Atrial fibrillation ([place])    Bipolar disorder ([place])    Breast mass    right, removed and benign   Cancer ([place]) 09/16/2019   ovarian ca no radiation no chemo   Cervical dysplasia    Diabetes mellitus without complication (HCC)    Endometriosis    GERD (gastroesophageal reflux disease)    Hypercholesteremia    Hypertension    Migraine    Pelvic kidney    left   Postpartum depression    hx of    Patient Active Problem List   Diagnosis Date Noted   Essential hypertension 04/07/2021   Paroxysmal atrial fibrillation ([place]) 12/31/2020   Secondary hypercoagulable state ([place]) 12/31/2020   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   Local superficial swelling, mass or lump 04/29/2020   Shortness of breath 04/29/2020   Fatigue 04/29/2020   History of endometrial hyperplasia 08/12/2019   Fatty liver disease, nonalcoholic 02/40/9735   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   History of robot-assisted laparoscopic hysterectomy 07/25/2019   Body mass index (BMI) 33.0-33.9, adult 05/11/2019   Cervical dysplasia 04/18/2019   Diabetes type 2, uncontrolled 04/18/2019   High cholesterol 04/18/2019   Other chronic pain 03/16/2019   Lumbago with sciatica, right side 03/16/2019   Degeneration of lumbar intervertebral disc 03/16/2019   Chronic low back pain 03/16/2019   Chest pain 06/14/2017    Hypertension complicating diabetes ([place]) 06/14/2017   Type 2 diabetes mellitus with other specified complication ([place]) 32/99/2426   Hyperlipidemia associated with type 2 diabetes mellitus ([place]) 06/14/2017   Displacement of lumbar intervertebral disc without myelopathy 09/20/2012   Bipolar disorder ([place])     Past Surgical History:  Procedure Laterality Date   ABDOMINAL HYSTERECTOMY N/A    Phreesia 07/30/2019   BACK SURGERY  02/16/2012   BREAST BIOPSY Right 2016   BREAST BIOPSY Right 2020   BREAST EXCISIONAL BIOPSY Right 2021   BREAST LUMPECTOMY WITH RADIOACTIVE SEED LOCALIZATION Right 10/28/2014   Procedure: RIGHT BREAST LUMPECTOMY WITH RADIOACTIVE SEED LOCALIZATION;  Surgeon: [name], MD;  Location: [facility];  Service: General;  Laterality: Right;   BREAST LUMPECTOMY WITH RADIOACTIVE SEED LOCALIZATION Right 06/18/2019   Procedure: RIGHT BREAST RADIOACTIVE SEED LOCALIZATION LUMPECTOMY;  Surgeon: Jovita Kussmaul, MD;  Location: [facility];  Service: General;  Laterality: Right;   BREAST SURGERY N/A    Phreesia 07/30/2019   CESAREAN SECTION  02/15/2001   COLONOSCOPY WITH PROPOFOL N/A 05/01/2018   Procedure: COLONOSCOPY WITH PROPOFOL;  Surgeon: [name], MD;  Location[facility] ENDOSCOPY;  Service: Gastroenterology;  Laterality: N/A;   COLPOSCOPY     ESOPHAGOGASTRODUODENOSCOPY (EGD) WITH PROPOFOL N/A 05/01/2018   Procedure: ESOPHAGOGASTRODUODENOSCOPY (EGD) WITH PROPOFOL;  Surgeon: [name], MD;  Location: [facility] ENDOSCOPY;  Service: Gastroenterology;  Laterality: N/A;   ESOPHAGOGASTRODUODENOSCOPY (EGD) WITH PROPOFOL  N/A 11/13/2018   Procedure: ESOPHAGOGASTRODUODENOSCOPY (EGD) WITH PROPOFOL;  Surgeon: [name], MD;  Location: [facility] ENDOSCOPY;  Service: Gastroenterology;  Laterality: N/A;   GYNECOLOGIC CRYOSURGERY     NECK SURGERY  02/15/2010   PELVIC LAPAROSCOPY  02/15/1989    OB History     Gravida  1   Para  1   Term  1   Preterm       AB  0   Living  1      SAB      IAB      Ectopic  0   Multiple      Live Births               Home Medications    Prior to Admission medications   Medication Sig Start Date End Date Taking? Authorizing Provider  cephALEXin (KEFLEX) 500 MG capsule Take 1 capsule (500 mg total) by mouth 4 (four) times daily. 07/31/21  Yes , Hildred Alamin E, FNP  acetaminophen (TYLENOL) 500 MG tablet Take 1,000 mg by mouth as needed. Takes 2-3 times weekly    [provider]  ALPRAZolam Duanne Moron) 1 MG tablet Take 1 mg by mouth daily as needed. 05/08/21   [provider]  ALPRAZOLAM XR 1 MG 24 hr tablet Take 1 mg by mouth every morning. 05/08/21   [provider]  apixaban (ELIQUIS) 5 MG TABS tablet Take 1 tablet (5 mg total) by mouth 2 (two) times daily. 11/03/20   Allred, Jeneen Rinks, MD  ARIPiprazole (ABILIFY) 15 MG tablet Take 15 mg by mouth at bedtime. 12/21/17   Noemi Chapel, NP  atorvastatin (LIPITOR) 40 MG tablet Take 1 tablet (40 mg total) by mouth daily. 04/07/21   Lorrene Reid, PA-C  clonazePAM (KLONOPIN) 1 MG tablet Take 1 mg by mouth daily as needed for anxiety. 07/09/19   [provider]  diltiazem (CARDIZEM CD) 120 MG 24 hr capsule Take 1 capsule (120 mg total) by mouth daily. 11/03/20   Allred, Jeneen Rinks, MD  DULoxetine (CYMBALTA) 60 MG capsule Take 60 mg by mouth at bedtime. 11/04/16   [provider]  glipiZIDE (GLUCOTROL) 5 MG tablet Take 1 tablet (5 mg total) by mouth daily before supper. 04/07/21   Lorrene Reid, PA-C  insulin aspart (NOVOLOG) 100 UNIT/ML injection Inject 10 Units into the skin 3 (three) times daily before meals. 08/26/20   Lorrene Reid, PA-C  losartan (COZAAR) 100 MG tablet Take 1 tablet (100 mg total) by mouth daily. Take 1 tablet daily by mouth. 04/07/21   Abonza, Maritza, PA-C  NOVOLOG FLEXPEN 100 UNIT/ML FlexPen INJECT 10 UNITS SUBCUTANEOUSLY 3 TIMES A DAY 02/26/21   Abonza, Maritza, PA-C  omeprazole (PRILOSEC) 20 MG capsule Take  20 mg by mouth daily.    [provider]  Semaglutide,0.25 or 0.'5MG'$ /DOS, (OZEMPIC, 0.25 OR 0.5 MG/DOSE,) 2 MG/1.5ML SOPN Inject 0.25 mg into the skin once weekly x 4 weeks. Then inject 0.5 mg into the skin once weekly. 04/07/21   Lorrene Reid, PA-C  tiZANidine (ZANAFLEX) 4 MG tablet Take 1 tablet by mouth at bedtime as needed. 05/14/21   [provider]  traMADol (ULTRAM) 50 MG tablet Take 1 tablet (50 mg total) by mouth every 6 (six) hours as needed. 05/09/21   Harvest Dark, MD  XIGDUO XR 11-998 MG TB24 TAKE 1 TABLET BY MOUTH EVERY DAY 06/08/21   Lorrene Reid, PA-C    Family History Family History  Problem Relation Age of Onset   Diabetes Mother  Breast cancer Mother 22   Thyroid disease Mother    Depression Mother    Hypertension Father    Heart disease Father    Heart attack Father 9       Two instances, first at age 17   Stroke Father    Alcohol abuse Father    Throat cancer Father    Diabetes Maternal Grandmother    Depression Maternal Grandmother    Uterine cancer Maternal Grandmother     Social History Social History   Tobacco Use   Smoking status: Never   Smokeless tobacco: Never   Tobacco comments:    Never smoke 07/01/21  Vaping Use   Vaping Use: Never used  Substance Use Topics   Alcohol use: No   Drug use: No     Allergies   Clindamycin/lincomycin, Other, and Lamotrigine   Review of Systems Review of Systems Per HPI  Physical Exam Triage Vital Signs ED Triage Vitals  Enc Vitals Group     BP 07/31/21 1100 124/81     Pulse Rate 07/31/21 1100 79     Resp 07/31/21 1100 18     Temp 07/31/21 1100 (!) 97.5 F (36.4 C)     Temp src --      SpO2 07/31/21 1100 95 %     Weight --      Height --      Head Circumference --      Peak Flow --      Pain Score 07/31/21 1059 7     Pain Loc --      Pain Edu? --      Excl. in Box? --    No data found.  Updated Vital Signs BP 124/81   Pulse 79   Temp (!) 97.5 F (36.4 C)    Resp 18   LMP 05/12/2011   SpO2 95%   Visual Acuity Right Eye Distance:   Left Eye Distance:   Bilateral Distance:    Right Eye Near:   Left Eye Near:    Bilateral Near:     Physical Exam Constitutional:      General: She is not in acute distress.    Appearance: Normal appearance. She is not toxic-appearing or diaphoretic.  HENT:     Head: Normocephalic and atraumatic.  Eyes:     Extraocular Movements: Extraocular movements intact.     Conjunctiva/sclera: Conjunctivae normal.  Cardiovascular:     Rate and Rhythm: Normal rate and regular rhythm.     Pulses: Normal pulses.     Heart sounds: Normal heart sounds.  Pulmonary:     Effort: Pulmonary effort is normal.     Breath sounds: Normal breath sounds.  Abdominal:     General: Bowel sounds are normal. There is no distension.     Palpations: Abdomen is soft.     Tenderness: There is no abdominal tenderness.  Neurological:     General: No focal deficit present.     Mental Status: She is alert and oriented to person, place, and time. Mental status is at baseline.  Psychiatric:        Mood and Affect: Mood normal.        Behavior: Behavior normal.        Thought Content: Thought content normal.        Judgment: Judgment normal.      UC Treatments / Results  Labs (all labs ordered are listed, but only abnormal results are displayed) Labs Reviewed  POCT  URINALYSIS DIP (MANUAL ENTRY) - Abnormal; Notable for the following components:      Result Value   Clarity, UA cloudy (*)    Glucose, UA >=1,000 (*)    Blood, UA large (*)    Protein Ur, POC >=300 (*)    Leukocytes, UA Small (1+) (*)    All other components within normal limits  URINE CULTURE    EKG   Radiology No results found.  Procedures Procedures (including critical care time)  Medications Ordered in UC Medications - No data to display  Initial Impression / Assessment and Plan / UC Course  I have reviewed the triage vital signs and the nursing  notes.  Pertinent labs & imaging results that were available during my care of the patient were reviewed by me and considered in my medical decision making (see chart for details).     Urinalysis indicating urinary tract infection.  Will treat with cephalexin antibiotic.  Creatinine clearance appears normal so no dosage adjustment necessary.  Patient does have elevated glucose in urine but takes Merleen Nicely which is most likely cause.  Blood glucose was slightly elevated in urgent care but no concern for hyperglycemia at this time.  Patient advised to monitor blood sugar more closely while on antibiotic.  Urine culture pending.  Discussed return precautions.  Patient verbalized understanding and was agreeable with plan. Final Clinical Impressions(s) / UC Diagnoses   Final diagnoses:  Acute cystitis with hematuria  Dysuria  Urinary frequency     Discharge Instructions      It appears that you have a urinary tract infection so this is being treated with an antibiotic.  Please monitor blood sugar more closely while on antibiotic as it may interact with your daily antidiabetic medications.  Urine culture is pending.  We will call if it is abnormal.  Please follow-up if symptoms persist or worsen.    ED Prescriptions     Medication Sig Dispense Auth. Provider   cephALEXin (KEFLEX) 500 MG capsule Take 1 capsule (500 mg total) by mouth 4 (four) times daily. 28 capsule [place], Michele Rockers, [place]      PDMP not reviewed this encounter.   Teodora Medici, [place] 07/31/21 1129

## 2021-07-31 NOTE — Discharge Instructions (Signed)
It appears that you have a urinary tract infection so this is being treated with an antibiotic.  Please monitor blood sugar more closely while on antibiotic as it may interact with your daily antidiabetic medications.  Urine culture is pending.  We will call if it is abnormal.  Please follow-up if symptoms persist or worsen.

## 2021-08-02 LAB — URINE CULTURE: Culture: >=100000 — AB

## 2021-08-05 ENCOUNTER — Encounter: Payer: Federal, State, Local not specified - PPO | Admitting: Physician Assistant

## 2021-08-07 ENCOUNTER — Inpatient Hospital Stay: Payer: Federal, State, Local not specified - PPO

## 2021-08-07 ENCOUNTER — Encounter: Payer: Self-pay | Admitting: Nurse Practitioner

## 2021-08-07 ENCOUNTER — Inpatient Hospital Stay: Payer: Federal, State, Local not specified - PPO | Attending: Nurse Practitioner | Admitting: Nurse Practitioner

## 2021-08-07 VITALS — BP 105/66 | HR 89 | Temp 98.2°F | Resp 18 | Ht 61.0 in | Wt 192.2 lb

## 2021-08-07 DIAGNOSIS — R7989 Other specified abnormal findings of blood chemistry: Secondary | ICD-10-CM

## 2021-08-07 LAB — CBC WITH DIFFERENTIAL (CANCER CENTER ONLY)
Abs Immature Granulocytes: 0.02 10*3/uL (ref 0.00–0.07)
Basophils Absolute: 0.1 10*3/uL (ref 0.0–0.1)
Basophils Relative: 1 %
Eosinophils Absolute: 0.3 10*3/uL (ref 0.0–0.5)
Eosinophils Relative: 4 %
HCT: 39.7 % (ref 36.0–46.0)
Hemoglobin: 12 g/dL (ref 12.0–15.0)
Immature Granulocytes: 0 %
Lymphocytes Relative: 30 %
Lymphs Abs: 2 10*3/uL (ref 0.7–4.0)
MCH: 24.4 pg — ABNORMAL LOW (ref 26.0–34.0)
MCHC: 30.2 g/dL (ref 30.0–36.0)
MCV: 80.9 fL (ref 80.0–100.0)
Monocytes Absolute: 0.2 10*3/uL (ref 0.1–1.0)
Monocytes Relative: 3 %
Neutro Abs: 4.2 10*3/uL (ref 1.7–7.7)
Neutrophils Relative %: 62 %
Platelet Count: 223 10*3/uL (ref 150–400)
RBC: 4.91 MIL/uL (ref 3.87–5.11)
RDW: 15.8 % — ABNORMAL HIGH (ref 11.5–15.5)
WBC Count: 6.7 10*3/uL (ref 4.0–10.5)
nRBC: 0 % (ref 0.0–0.2)

## 2021-08-07 LAB — FERRITIN: Ferritin: 20 ng/mL (ref 11–307)

## 2021-08-07 LAB — SAVE SMEAR(SSMR), FOR PROVIDER SLIDE REVIEW

## 2021-08-11 DIAGNOSIS — R7989 Other specified abnormal findings of blood chemistry: Secondary | ICD-10-CM | POA: Diagnosis not present

## 2021-08-12 DIAGNOSIS — R7989 Other specified abnormal findings of blood chemistry: Secondary | ICD-10-CM | POA: Diagnosis not present

## 2021-08-14 DIAGNOSIS — R7989 Other specified abnormal findings of blood chemistry: Secondary | ICD-10-CM | POA: Diagnosis not present

## 2021-08-17 ENCOUNTER — Other Ambulatory Visit (HOSPITAL_BASED_OUTPATIENT_CLINIC_OR_DEPARTMENT_OTHER): Payer: Self-pay

## 2021-08-17 DIAGNOSIS — R7989 Other specified abnormal findings of blood chemistry: Secondary | ICD-10-CM

## 2021-08-17 LAB — OCCULT BLOOD X 1 CARD TO LAB, STOOL
Fecal Occult Bld: NEGATIVE
Fecal Occult Bld: NEGATIVE
Fecal Occult Bld: NEGATIVE

## 2021-08-19 ENCOUNTER — Telehealth: Payer: Self-pay

## 2021-08-19 NOTE — Telephone Encounter (Signed)
Patient gave verbal understanding and had no further questions or concerns  

## 2021-08-19 NOTE — Telephone Encounter (Signed)
-----   Message from Owens Shark, NP sent at 08/19/2021  7:54 AM EDT ----- Please let her know stool cards negative for blood, follow-up as scheduled.

## 2021-09-05 ENCOUNTER — Emergency Department: Payer: Federal, State, Local not specified - PPO

## 2021-09-05 ENCOUNTER — Other Ambulatory Visit: Payer: Self-pay

## 2021-09-05 ENCOUNTER — Emergency Department
Admission: EM | Admit: 2021-09-05 | Discharge: 2021-09-06 | Disposition: A | Discharge status: Home / Self Care | Payer: Federal, State, Local not specified - PPO | Attending: Emergency Medicine | Admitting: Emergency Medicine

## 2021-09-05 DIAGNOSIS — E119 Type 2 diabetes mellitus without complications: Secondary | ICD-10-CM | POA: Diagnosis not present

## 2021-09-05 DIAGNOSIS — R0789 Other chest pain: Secondary | ICD-10-CM | POA: Insufficient documentation

## 2021-09-05 DIAGNOSIS — I10 Essential (primary) hypertension: Secondary | ICD-10-CM | POA: Diagnosis not present

## 2021-09-05 DIAGNOSIS — R110 Nausea: Secondary | ICD-10-CM | POA: Diagnosis not present

## 2021-09-05 DIAGNOSIS — R202 Paresthesia of skin: Secondary | ICD-10-CM | POA: Insufficient documentation

## 2021-09-05 DIAGNOSIS — R1012 Left upper quadrant pain: Secondary | ICD-10-CM | POA: Insufficient documentation

## 2021-09-05 DIAGNOSIS — M545 Low back pain, unspecified: Secondary | ICD-10-CM | POA: Insufficient documentation

## 2021-09-05 DIAGNOSIS — R079 Chest pain, unspecified: Secondary | ICD-10-CM | POA: Diagnosis present

## 2021-09-05 DIAGNOSIS — R519 Headache, unspecified: Secondary | ICD-10-CM | POA: Insufficient documentation

## 2021-09-05 LAB — CBC
HCT: 44.3 % (ref 36.0–46.0)
Hemoglobin: 13.5 g/dL (ref 12.0–15.0)
MCH: 24.2 pg — ABNORMAL LOW (ref 26.0–34.0)
MCHC: 30.5 g/dL (ref 30.0–36.0)
MCV: 79.4 fL — ABNORMAL LOW (ref 80.0–100.0)
Platelets: 248 10*3/uL (ref 150–400)
RBC: 5.58 MIL/uL — ABNORMAL HIGH (ref 3.87–5.11)
RDW: 15.6 % — ABNORMAL HIGH (ref 11.5–15.5)
WBC: 9.2 10*3/uL (ref 4.0–10.5)
nRBC: 0 % (ref 0.0–0.2)

## 2021-09-05 LAB — DIFFERENTIAL
Abs Immature Granulocytes: 0.04 10*3/uL (ref 0.00–0.07)
Basophils Absolute: 0.1 10*3/uL (ref 0.0–0.1)
Basophils Relative: 1 %
Eosinophils Absolute: 0.5 10*3/uL (ref 0.0–0.5)
Eosinophils Relative: 6 %
Immature Granulocytes: 0 %
Lymphocytes Relative: 27 %
Lymphs Abs: 2.5 10*3/uL (ref 0.7–4.0)
Monocytes Absolute: 0.3 10*3/uL (ref 0.1–1.0)
Monocytes Relative: 3 %
Neutro Abs: 5.8 10*3/uL (ref 1.7–7.7)
Neutrophils Relative %: 63 %

## 2021-09-05 LAB — COMPREHENSIVE METABOLIC PANEL
ALT: 39 U/L (ref 0–44)
AST: 35 U/L (ref 15–41)
Albumin: 4.3 g/dL (ref 3.5–5.0)
Alkaline Phosphatase: 138 U/L — ABNORMAL HIGH (ref 38–126)
Anion gap: 7 (ref 5–15)
BUN: 11 mg/dL (ref 6–20)
CO2: 27 mmol/L (ref 22–32)
Calcium: 9.5 mg/dL (ref 8.9–10.3)
Chloride: 103 mmol/L (ref 98–111)
Creatinine, Ser: 0.72 mg/dL (ref 0.44–1.00)
GFR, Estimated: >60 mL/min (ref >60)
Glucose, Bld: 143 mg/dL — ABNORMAL HIGH (ref 70–99)
Potassium: 3.8 mmol/L (ref 3.5–5.1)
Sodium: 137 mmol/L (ref 135–145)
Total Bilirubin: 0.7 mg/dL (ref 0.3–1.2)
Total Protein: 7.9 g/dL (ref 6.5–8.1)

## 2021-09-05 LAB — PROTIME-INR
INR: 1 (ref 0.8–1.2)
Prothrombin Time: 12.8 seconds (ref 11.4–15.2)

## 2021-09-05 LAB — APTT: aPTT: 29 seconds (ref 24–36)

## 2021-09-05 LAB — TROPONIN I (HIGH SENSITIVITY)
Troponin I (High Sensitivity): 3 ng/L (ref <18)
Troponin I (High Sensitivity): 4 ng/L (ref <18)

## 2021-09-05 LAB — CBG MONITORING, ED: Glucose-Capillary: 140 mg/dL — ABNORMAL HIGH (ref 70–99)

## 2021-09-05 LAB — POC URINE PREG, ED: Preg Test, Ur: NEGATIVE

## 2021-09-05 LAB — ETHANOL: Alcohol, Ethyl (B): <10 mg/dL (ref <10)

## 2021-09-05 NOTE — ED Triage Notes (Signed)
C/o intermittent left side chest pain since 4pm this evening. Also c/o left facial numbness that started at same. Denies other neuro deficits. Initial NIH in triage 1 for decreased sensation to left side of face.  Chest pain radiates into left shoulder. "Feels like somebody has a fist right in my chest." Denies SOB.

## 2021-09-05 NOTE — ED Triage Notes (Signed)
First RN Note: Pt to ED via [facility] with c/o CP that is substernal with intermittent radiation to L shoulder. Per EMS pt also c/o L sided HA and nausea since 1600 today. Per EMS pt with hx of same and was in A-fib. Per EMS pt in NSR en route. Per EMS pt also c/o some numbness to L side of her face PTA, however no complaints on their arrival.   122/78 78NSR 98% RA 18RR CBG 139

## 2021-09-06 MED ORDER — FAMOTIDINE 20 MG PO TABS: 20.0000 mg | ORAL_TABLET | Freq: Two times a day (BID) | ORAL | 0 refills | Status: DC

## 2021-09-06 MED ORDER — ALUM & MAG HYDROXIDE-SIMETH 200-200-20 MG/5ML PO SUSP
30.0000 mL | Freq: Once | ORAL | Status: AC
  Administered 2021-09-06: 30 mL via ORAL
  Filled 2021-09-06: qty 30

## 2021-09-06 MED ORDER — ALUMINUM-MAGNESIUM-SIMETHICONE 200-200-20 MG/5ML PO SUSP: 30.0000 mL | Freq: Three times a day (TID) | ORAL | 0 refills | Status: DC

## 2021-09-06 MED ORDER — FAMOTIDINE 20 MG PO TABS
40.0000 mg | ORAL_TABLET | Freq: Once | ORAL | Status: AC
Start: 2021-09-06 — End: 2021-09-06
  Administered 2021-09-06: 40 mg via ORAL
  Filled 2021-09-06: qty 2

## 2021-09-06 NOTE — ED Provider Notes (Signed)
[facility] Provider Note    Event Date/Time   First MD Initiated Contact with Patient 09/05/21 2339     (approximate)   History   Chief Complaint: Chest Pain   HPI  Morgan [name] is a 56 y.o. female with a history of bipolar disorder hypertension diabetes hyperlipidemia and anxiety and GERD who comes the ED complaining of chest pain that started while at rest watching TV at 4:00 PM today.  It is intermittent, no aggravating or alleviating factors.  Nonradiating.  No shortness of breath diaphoresis or vomiting.  Not exertional, not pleuritic.  She reports compliance with her medications.  Patient also reports some intermittent left face paresthesia.  No vision change, no sensory change anywhere else in the body.  No motor weakness.  No change in balance or coordination.  She does also report a mild bilateral frontal headache, nausea, and low back pain.     Physical Exam   Triage Vital Signs: ED Triage Vitals  Enc Vitals Group     BP 09/05/21 2036 132/77     Pulse Rate 09/05/21 2036 86     Resp 09/05/21 2036 18     Temp 09/05/21 2036 98.4 F (36.9 C)     Temp Source 09/05/21 2036 Oral     SpO2 09/05/21 2036 96 %     Weight 09/05/21 2038 185 lb (83.9 kg)     Height 09/05/21 2038 '5\' 1"'$  (1.549 m)     Head Circumference --      Peak Flow --      Pain Score 09/05/21 2037 6     Pain Loc --      Pain Edu? --      Excl. in [place]? --     Most recent vital signs: Vitals:   09/05/21 2245 09/06/21 0014  BP: 127/74 (!) 132/59  Pulse: 80 82  Resp: 17 18  Temp:    SpO2: 98% 99%    General: Awake, no distress.  CV:  Good peripheral perfusion.  Regular rate and rhythm Resp:  Normal effort.  Clear to auscultation bilaterally Abd:  No distention.  Soft with mild left upper quadrant tenderness. Other:  No lower extremity edema.  Moist oral mucosa   ED Results / Procedures / Treatments   Labs (all labs ordered are listed, but only abnormal results  are displayed) Labs Reviewed  CBC - Abnormal; Notable for the following components:      Result Value   RBC 5.58 (*)    MCV 79.4 (*)    MCH 24.2 (*)    RDW 15.6 (*)    All other components within normal limits  COMPREHENSIVE METABOLIC PANEL - Abnormal; Notable for the following components:   Glucose, Bld 143 (*)    Alkaline Phosphatase 138 (*)    All other components within normal limits  CBG MONITORING, ED - Abnormal; Notable for the following components:   Glucose-Capillary 140 (*)    All other components within normal limits  PROTIME-INR  APTT  DIFFERENTIAL  ETHANOL  POC URINE PREG, ED  POC URINE PREG, ED  TROPONIN I (HIGH SENSITIVITY)  TROPONIN I (HIGH SENSITIVITY)     EKG Interpreted by me Normal sinus rhythm rate of 78.  Normal axis intervals QRS ST segments and T waves.  No ischemic changes.   RADIOLOGY Chest x-ray interpreted by me, appears normal.  Radiology report reviewed CT head unremarkable.   PROCEDURES:  Procedures   MEDICATIONS ORDERED IN  ED: Medications  alum & mag hydroxide-simeth (MAALOX/MYLANTA) 200-200-20 MG/5ML suspension 30 mL (30 mLs Oral Given 09/06/21 0013)  famotidine (PEPCID) tablet 40 mg (40 mg Oral Given 09/06/21 0013)     IMPRESSION / MDM / ASSESSMENT AND PLAN / ED COURSE  I reviewed the triage vital signs and the nursing notes.                              Differential diagnosis includes, but is not limited to, viral syndrome, GERD, non-STEMI, pneumothorax, pneumonia, intracranial mass  Patient's presentation is most consistent with acute presentation with potential threat to life or bodily function.  Patient presents with atypical chest pain. Considering the patient's symptoms, medical history, and physical examination today, I have low suspicion for ACS, PE, TAD, pneumothorax, carditis, mediastinitis, pneumonia, CHF, or sepsis. With her age and comorbidities, serial troponins were obtained which are normal.  Chest x-ray and  EKG also unremarkable.  The rest of the work-up is reassuring.  CT head was obtained due to her left face paresthesia and is unremarkable.  I have low suspicion for stroke, cerebral aneurysm, intracranial hemorrhage, meningitis encephalitis glaucoma or elevated intraocular or intracranial pressure.  We will do trial of increased antacids and recommend close follow-up with PCP.  She does not require admission with reassuring work-up and atypical symptoms.       FINAL CLINICAL IMPRESSION(S) / ED DIAGNOSES   Final diagnoses:  Atypical chest pain     Rx / DC Orders   ED Discharge Orders          Ordered    famotidine (PEPCID) 20 MG tablet  2 times daily        09/06/21 0038    aluminum-magnesium hydroxide-simethicone (MAALOX) 200-200-20 MG/5ML SUSP  3 times daily before meals & bedtime        09/06/21 0038             Note:  This document was prepared using Dragon voice recognition software and may include unintentional dictation errors.   Carrie Mew, MD 09/06/21 [phone]

## 2021-09-06 NOTE — Discharge Instructions (Addendum)
Your lab tests, EKG, Chest xray, and examination today were all okay. Try taking maalox and pepcid for the next week to see if this helps with your symptoms, and follow up with your doctor for further evaluation.

## 2021-09-16 ENCOUNTER — Other Ambulatory Visit: Payer: Self-pay | Admitting: Physician Assistant

## 2021-09-20 ENCOUNTER — Other Ambulatory Visit: Payer: Self-pay

## 2021-09-20 ENCOUNTER — Encounter (HOSPITAL_COMMUNITY): Payer: Self-pay

## 2021-09-20 ENCOUNTER — Emergency Department (HOSPITAL_COMMUNITY): Payer: Federal, State, Local not specified - PPO

## 2021-09-20 ENCOUNTER — Emergency Department (HOSPITAL_COMMUNITY)
Admission: EM | Admit: 2021-09-20 | Discharge: 2021-09-20 | Disposition: A | Discharge status: Home / Self Care | Payer: Federal, State, Local not specified - PPO | Attending: Emergency Medicine | Admitting: Emergency Medicine

## 2021-09-20 DIAGNOSIS — R112 Nausea with vomiting, unspecified: Secondary | ICD-10-CM | POA: Diagnosis not present

## 2021-09-20 DIAGNOSIS — Z794 Long term (current) use of insulin: Secondary | ICD-10-CM | POA: Diagnosis not present

## 2021-09-20 DIAGNOSIS — R1032 Left lower quadrant pain: Secondary | ICD-10-CM | POA: Diagnosis not present

## 2021-09-20 DIAGNOSIS — Z7901 Long term (current) use of anticoagulants: Secondary | ICD-10-CM | POA: Diagnosis not present

## 2021-09-20 LAB — COMPREHENSIVE METABOLIC PANEL
ALT: 42 U/L (ref 0–44)
AST: 32 U/L (ref 15–41)
Albumin: 3.6 g/dL (ref 3.5–5.0)
Alkaline Phosphatase: 120 U/L (ref 38–126)
Anion gap: 8 (ref 5–15)
BUN: 6 mg/dL (ref 6–20)
CO2: 24 mmol/L (ref 22–32)
Calcium: 9.3 mg/dL (ref 8.9–10.3)
Chloride: 105 mmol/L (ref 98–111)
Creatinine, Ser: 0.7 mg/dL (ref 0.44–1.00)
GFR, Estimated: >60 mL/min (ref >60)
Glucose, Bld: 215 mg/dL — ABNORMAL HIGH (ref 70–99)
Potassium: 3.7 mmol/L (ref 3.5–5.1)
Sodium: 137 mmol/L (ref 135–145)
Total Bilirubin: 0.7 mg/dL (ref 0.3–1.2)
Total Protein: 6.7 g/dL (ref 6.5–8.1)

## 2021-09-20 LAB — URINALYSIS, ROUTINE W REFLEX MICROSCOPIC
Bacteria, UA: NONE SEEN
Bilirubin Urine: NEGATIVE
Glucose, UA: >=500 mg/dL — AB
Hgb urine dipstick: NEGATIVE
Ketones, ur: NEGATIVE mg/dL
Leukocytes,Ua: NEGATIVE
Nitrite: NEGATIVE
Protein, ur: NEGATIVE mg/dL
Specific Gravity, Urine: 1.027 (ref 1.005–1.030)
pH: 5 (ref 5.0–8.0)

## 2021-09-20 LAB — CBC
HCT: 39.4 % (ref 36.0–46.0)
Hemoglobin: 12.7 g/dL (ref 12.0–15.0)
MCH: 25.6 pg — ABNORMAL LOW (ref 26.0–34.0)
MCHC: 32.2 g/dL (ref 30.0–36.0)
MCV: 79.4 fL — ABNORMAL LOW (ref 80.0–100.0)
Platelets: 205 10*3/uL (ref 150–400)
RBC: 4.96 MIL/uL (ref 3.87–5.11)
RDW: 15.4 % (ref 11.5–15.5)
WBC: 8.1 10*3/uL (ref 4.0–10.5)
nRBC: 0 % (ref 0.0–0.2)

## 2021-09-20 LAB — I-STAT BETA HCG BLOOD, ED (MC, WL, AP ONLY): I-stat hCG, quantitative: <5 m[IU]/mL (ref <5)

## 2021-09-20 LAB — LIPASE, BLOOD: Lipase: 26 U/L (ref 11–51)

## 2021-09-20 MED ORDER — ONDANSETRON 4 MG PO TBDP: ORAL_TABLET | ORAL | 0 refills | Status: DC

## 2021-09-20 MED ORDER — SODIUM CHLORIDE 0.9 % IV BOLUS
1000.0000 mL | Freq: Once | INTRAVENOUS | Status: AC
  Administered 2021-09-20: 1000 mL via INTRAVENOUS

## 2021-09-20 MED ORDER — MORPHINE SULFATE (PF) 4 MG/ML IV SOLN
4.0000 mg | Freq: Once | INTRAVENOUS | Status: AC
  Administered 2021-09-20: 4 mg via INTRAVENOUS
  Filled 2021-09-20: qty 1

## 2021-09-20 MED ORDER — KETOROLAC TROMETHAMINE 15 MG/ML IJ SOLN
15.0000 mg | Freq: Once | INTRAMUSCULAR | Status: AC
  Administered 2021-09-20: 15 mg via INTRAVENOUS
  Filled 2021-09-20: qty 1

## 2021-09-20 MED ORDER — ONDANSETRON HCL 4 MG/2ML IJ SOLN
4.0000 mg | Freq: Once | INTRAMUSCULAR | Status: AC
  Administered 2021-09-20: 4 mg via INTRAVENOUS
  Filled 2021-09-20: qty 2

## 2021-09-20 NOTE — ED Triage Notes (Signed)
Complains of left lower quad pain that started yesterday and has progressively got worse.  Complains of nausea/vomiting associate. Hx of diverticulitis and kidney stone.  Denies urinary symptoms or diarrhea.

## 2021-09-20 NOTE — ED Notes (Signed)
Patient transported to CT 

## 2021-09-20 NOTE — Discharge Instructions (Addendum)
Your urine showed no signs of infection.  The CT scan did not show any obvious cause of your discomfort.  Please follow-up with your family doctor.  Return for worsening symptoms fever or inability eat or drink.  Take 4 over the counter ibuprofen tablets 3 times a day or 2 over-the-counter naproxen tablets twice a day for pain. Also take tylenol '1000mg'$ (2 extra strength) four times a day.

## 2021-09-20 NOTE — ED Notes (Signed)
Up to b/r, steady gait, alert, NAD, calm, interactive.

## 2021-09-20 NOTE — ED Provider Notes (Signed)
[facility] EMERGENCY DEPARTMENT Provider Note   CSN: [id number] Arrival date & time: 09/20/21  7681     History  Chief Complaint  Patient presents with   Abdominal Pain    Morgan [name] is a 56 y.o. female.  56 yo F with a chief complaints of left lower quadrant abdominal discomfort.  This been going on for couple days.  Has had some nausea and vomiting with this.  No fevers or chills.  No diarrhea no urinary symptoms.  Has a history of a hysterectomy and a left oophorectomy.  Denies vaginal bleeding or discharge.  Denies trauma.  Never had pain like this before.  She thinks she has a pelvic kidney and she is not sure which side it is on.  Is wondering if that is causing her discomfort.   Abdominal Pain      Home Medications Prior to Admission medications   Medication Sig Start Date End Date Taking? Authorizing Provider  ondansetron (ZOFRAN-ODT) 4 MG disintegrating tablet '4mg'$  ODT q4 hours prn nausea/vomit 09/20/21  Yes Deno Etienne, DO  acetaminophen (TYLENOL) 500 MG tablet Take 1,000 mg by mouth as needed. Takes 2-3 times weekly    [provider]  ALPRAZolam Duanne Moron) 1 MG tablet Take 1 mg by mouth daily as needed. 05/08/21   [provider]  ALPRAZOLAM XR 1 MG 24 hr tablet Take 1 mg by mouth every morning. 05/08/21   [provider]  aluminum-magnesium hydroxide-simethicone (MAALOX) 157-262-03 MG/5ML SUSP Take 30 mLs by mouth 4 (four) times daily -  before meals and at bedtime. 09/06/21   Carrie Mew, MD  apixaban (ELIQUIS) 5 MG TABS tablet Take 1 tablet (5 mg total) by mouth 2 (two) times daily. 11/03/20   Allred, Jeneen Rinks, MD  ARIPiprazole (ABILIFY) 15 MG tablet Take 15 mg by mouth at bedtime. 12/21/17   Noemi Chapel, NP  atorvastatin (LIPITOR) 40 MG tablet Take 1 tablet (40 mg total) by mouth daily. 04/07/21   Lorrene Reid, PA-C  cephALEXin (KEFLEX) 500 MG capsule Take 1 capsule (500 mg total) by mouth 4 (four) times daily. 07/31/21    Teodora Medici, FNP  clonazePAM (KLONOPIN) 1 MG tablet Take 1 mg by mouth daily as needed for anxiety. Patient not taking: Reported on 08/07/2021 07/09/19   [provider]  diltiazem (CARDIZEM CD) 120 MG 24 hr capsule Take 1 capsule (120 mg total) by mouth daily. 11/03/20   Allred, Jeneen Rinks, MD  DULoxetine (CYMBALTA) 60 MG capsule Take 60 mg by mouth at bedtime. 11/04/16   [provider]  famotidine (PEPCID) 20 MG tablet Take 1 tablet (20 mg total) by mouth 2 (two) times daily. 09/06/21   Carrie Mew, MD  glipiZIDE (GLUCOTROL) 5 MG tablet Take 1 tablet (5 mg total) by mouth daily before supper. Patient not taking: Reported on 08/07/2021 04/07/21   Lorrene Reid, PA-C  insulin aspart (NOVOLOG) 100 UNIT/ML injection Inject 10 Units into the skin 3 (three) times daily before meals. 08/26/20   Lorrene Reid, PA-C  losartan (COZAAR) 100 MG tablet Take 1 tablet (100 mg total) by mouth daily. Take 1 tablet daily by mouth. 04/07/21   Abonza, Maritza, PA-C  NOVOLOG FLEXPEN 100 UNIT/ML FlexPen INJECT 10 UNITS INTO SKIN 3 TIMES A DAY 09/16/21   Abonza, Maritza, PA-C  omeprazole (PRILOSEC) 20 MG capsule Take 20 mg by mouth daily.    [provider]  Semaglutide,0.25 or 0.'5MG'$ /DOS, (OZEMPIC, 0.25 OR 0.5 MG/DOSE,) 2 MG/1.5ML SOPN Inject 0.25  mg into the skin once weekly x 4 weeks. Then inject 0.5 mg into the skin once weekly. 04/07/21   Lorrene Reid, PA-C  tiZANidine (ZANAFLEX) 4 MG tablet Take 1 tablet by mouth at bedtime as needed. 05/14/21   [provider]  traMADol (ULTRAM) 50 MG tablet Take 1 tablet (50 mg total) by mouth every 6 (six) hours as needed. Patient not taking: Reported on 08/07/2021 05/09/21   Harvest Dark, MD  XIGDUO XR 11-998 MG TB24 TAKE 1 TABLET BY MOUTH EVERY DAY 06/08/21   Lorrene Reid, PA-C      Allergies    Clindamycin/lincomycin, Other, and Lamotrigine    Review of Systems   Review of Systems  Gastrointestinal:  Positive for abdominal  pain.    Physical Exam Updated Vital Signs BP 135/71   Pulse 79   Temp 98.6 F (37 C) (Oral)   Resp 18   Ht '5\' 1"'$  (1.549 m)   Wt 83.9 kg   LMP 05/12/2011   SpO2 95%   BMI 34.96 kg/m  Physical Exam Vitals and nursing note reviewed.  Constitutional:      General: She is not in acute distress.    Appearance: She is well-developed. She is not diaphoretic.     Comments: BMI 35  HENT:     Head: Normocephalic and atraumatic.  Eyes:     Pupils: Pupils are equal, round, and reactive to light.  Cardiovascular:     Rate and Rhythm: Normal rate and regular rhythm.     Heart sounds: No murmur heard.    No friction rub. No gallop.  Pulmonary:     Effort: Pulmonary effort is normal.     Breath sounds: No wheezing or rales.  Abdominal:     General: There is no distension.     Palpations: Abdomen is soft.     Tenderness: There is no abdominal tenderness.     Comments: No obvious abdominal discomfort.  Pain worse just above the left inguinal ligament.  No masses or bulges.  Musculoskeletal:        General: No tenderness.     Cervical back: Normal range of motion and neck supple.  Skin:    General: Skin is warm and dry.  Neurological:     Mental Status: She is alert and oriented to person, place, and time.  Psychiatric:        Behavior: Behavior normal.     ED Results / Procedures / Treatments   Labs (all labs ordered are listed, but only abnormal results are displayed) Labs Reviewed  COMPREHENSIVE METABOLIC PANEL - Abnormal; Notable for the following components:      Result Value   Glucose, Bld 215 (*)    All other components within normal limits  CBC - Abnormal; Notable for the following components:   MCV 79.4 (*)    MCH 25.6 (*)    All other components within normal limits  URINALYSIS, ROUTINE W REFLEX MICROSCOPIC - Abnormal; Notable for the following components:   Glucose, UA >=500 (*)    All other components within normal limits  LIPASE, BLOOD  I-STAT BETA HCG  BLOOD, ED ([facility], [facility], [facility] ONLY)    EKG None  Radiology CT Renal Stone Study  Result Date: 09/20/2021 CLINICAL DATA:  Flank pain. Kidney stone suspected. Progressive left lower quadrant pain which began yesterday. EXAM: CT ABDOMEN AND PELVIS WITHOUT CONTRAST TECHNIQUE: Multidetector CT imaging of the abdomen and pelvis was performed following the standard protocol without IV contrast.  RADIATION DOSE REDUCTION: This exam was performed according to the departmental dose-optimization program which includes automated exposure control, adjustment of the mA and/or kV according to patient size and/or use of iterative reconstruction technique. COMPARISON:  05/09/2021 FINDINGS: Lower chest: No acute abnormality. Hepatobiliary: Diffuse hepatic steatosis. No focal liver abnormality. Gallbladder appears within normal limits. No biliary dilatation. Pancreas: Unremarkable. No pancreatic ductal dilatation or surrounding inflammatory changes. Spleen: Normal in size without focal abnormality. Adrenals/Urinary Tract: Normal adrenal glands. The right kidney is normal without hydronephrosis, nephrolithiasis or mass. No right hydroureter or ureteral calculi. There is a left pelvic kidney with several stones measuring up to 3 mm. No left-sided hydronephrosis, hydroureter or ureteral lithiasis. Urinary bladder appears normal. Stomach/Bowel: Stomach is normal. No bowel wall thickening, inflammation, or distension. Vascular/Lymphatic: No significant vascular findings are present. No enlarged abdominal or pelvic lymph nodes. Reproductive: Status post hysterectomy.  No adnexal mass. Other: No free fluid or fluid collections. No signs of pneumoperitoneum. Musculoskeletal: Postoperative changes from previous L4-5 PLIF with solid fusion of the L4-5 disc space. Sclerotic lesion within the L2 vertebral body is unchanged. No acute or suspicious osseous findings. IMPRESSION: 1. No acute findings within the abdomen or pelvis. 2. 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 3. Hepatic steatosis. Electronically Signed   By: Kerby Moors M.D.   On: 09/20/2021 09:14    Procedures Procedures    Medications Ordered in ED Medications  morphine (PF) 4 MG/ML injection 4 mg (4 mg Intravenous Given 09/20/21 0826)  ondansetron (ZOFRAN) injection 4 mg (4 mg Intravenous Given 09/20/21 0822)  sodium chloride 0.9 % bolus 1,000 mL (1,000 mLs Intravenous New Bag/Given 09/20/21 0822)    ED Course/ Medical Decision Making/ A&P                           Medical Decision Making Amount and/or Complexity of Data Reviewed Labs: ordered. Radiology: ordered.  Risk Prescription drug management.   56 yo F with a chief complaints of left lower quadrant abdominal discomfort.  Pain is localized more to where it expects her to have ovarian discomfort that the patient has had a history of an oophorectomy on that side.  She does have a reported pelvic kidney and so potentially has pyelonephritis or UTI.  We will obtain a stone study as she does have a remote history of kidney stones.  Treat pain and nausea.  Blood work.  Reassess.  UA independently interpreted by me without infection.  No electrolyte abnormalities, no anemia, no leukocytosis.  CT stone study with no obvious concerning finding.  We will discharge the patient home.  PCP follow-up.  9:23 AM:  I have discussed the diagnosis/risks/treatment options with the patient.  Evaluation and diagnostic testing in the emergency department does not suggest an emergent condition requiring admission or immediate intervention beyond what has been performed at this time.  They will follow up with  PCP. We also discussed returning to the ED immediately if new or worsening sx occur. We discussed the sx which are most concerning (e.g., sudden worsening pain, fever, inability to tolerate by mouth) that necessitate immediate return. Medications administered to the patient during their visit and any new  prescriptions provided to the patient are listed below.  Medications given during this visit Medications  morphine (PF) 4 MG/ML injection 4 mg (4 mg Intravenous Given 09/20/21 0826)  ondansetron (ZOFRAN) injection 4 mg (4 mg Intravenous Given 09/20/21 0822)  sodium chloride  0.9 % bolus 1,000 mL (1,000 mLs Intravenous New Bag/Given 09/20/21 1884)     The patient appears reasonably screen and/or stabilized for discharge and I doubt any other medical condition or other [facility] requiring further screening, evaluation, or treatment in the ED at this time prior to discharge.          Final Clinical Impression(s) / ED Diagnoses Final diagnoses:  Left lower quadrant abdominal pain    Rx / DC Orders ED Discharge Orders          Ordered    ondansetron (ZOFRAN-ODT) 4 MG disintegrating tablet        09/20/21 Sharon, Cody, DO 09/20/21 [phone]

## 2021-10-07 ENCOUNTER — Inpatient Hospital Stay: Payer: Federal, State, Local not specified - PPO | Admitting: Oncology

## 2021-10-07 ENCOUNTER — Inpatient Hospital Stay: Payer: Federal, State, Local not specified - PPO | Attending: Nurse Practitioner

## 2021-10-07 VITALS — BP 148/86 | HR 80 | Temp 98.2°F | Resp 18 | Ht 61.0 in | Wt 191.8 lb

## 2021-10-07 DIAGNOSIS — I4891 Unspecified atrial fibrillation: Secondary | ICD-10-CM | POA: Diagnosis not present

## 2021-10-07 DIAGNOSIS — R7989 Other specified abnormal findings of blood chemistry: Secondary | ICD-10-CM | POA: Insufficient documentation

## 2021-10-07 DIAGNOSIS — R109 Unspecified abdominal pain: Secondary | ICD-10-CM | POA: Insufficient documentation

## 2021-10-07 DIAGNOSIS — R5381 Other malaise: Secondary | ICD-10-CM | POA: Diagnosis not present

## 2021-10-07 DIAGNOSIS — E119 Type 2 diabetes mellitus without complications: Secondary | ICD-10-CM | POA: Diagnosis not present

## 2021-10-07 DIAGNOSIS — I10 Essential (primary) hypertension: Secondary | ICD-10-CM | POA: Diagnosis not present

## 2021-10-07 DIAGNOSIS — F319 Bipolar disorder, unspecified: Secondary | ICD-10-CM | POA: Diagnosis not present

## 2021-10-07 DIAGNOSIS — Z7901 Long term (current) use of anticoagulants: Secondary | ICD-10-CM | POA: Insufficient documentation

## 2021-10-07 DIAGNOSIS — E7800 Pure hypercholesterolemia, unspecified: Secondary | ICD-10-CM | POA: Insufficient documentation

## 2021-10-07 DIAGNOSIS — Z79899 Other long term (current) drug therapy: Secondary | ICD-10-CM | POA: Diagnosis not present

## 2021-10-07 LAB — URINALYSIS, COMPLETE (UACMP) WITH MICROSCOPIC
Bilirubin Urine: NEGATIVE
Glucose, UA: NEGATIVE mg/dL
Hgb urine dipstick: NEGATIVE
Ketones, ur: NEGATIVE mg/dL
Leukocytes,Ua: NEGATIVE
Nitrite: NEGATIVE
Specific Gravity, Urine: 1.022 (ref 1.005–1.030)
pH: 5.5 (ref 5.0–8.0)

## 2021-10-07 LAB — CBC WITH DIFFERENTIAL (CANCER CENTER ONLY)
Abs Immature Granulocytes: 0.03 10*3/uL (ref 0.00–0.07)
Basophils Absolute: 0.1 10*3/uL (ref 0.0–0.1)
Basophils Relative: 1 %
Eosinophils Absolute: 0.4 10*3/uL (ref 0.0–0.5)
Eosinophils Relative: 6 %
HCT: 40 % (ref 36.0–46.0)
Hemoglobin: 12.8 g/dL (ref 12.0–15.0)
Immature Granulocytes: 1 %
Lymphocytes Relative: 35 %
Lymphs Abs: 2.2 10*3/uL (ref 0.7–4.0)
MCH: 25.1 pg — ABNORMAL LOW (ref 26.0–34.0)
MCHC: 32 g/dL (ref 30.0–36.0)
MCV: 78.4 fL — ABNORMAL LOW (ref 80.0–100.0)
Monocytes Absolute: 0.3 10*3/uL (ref 0.1–1.0)
Monocytes Relative: 4 %
Neutro Abs: 3.4 10*3/uL (ref 1.7–7.7)
Neutrophils Relative %: 53 %
Platelet Count: 192 10*3/uL (ref 150–400)
RBC: 5.1 MIL/uL (ref 3.87–5.11)
RDW: 14.9 % (ref 11.5–15.5)
WBC Count: 6.3 10*3/uL (ref 4.0–10.5)
nRBC: 0 % (ref 0.0–0.2)

## 2021-10-07 MED ORDER — FERROUS SULFATE 325 (65 FE) MG PO TBEC: 325.0000 mg | DELAYED_RELEASE_TABLET | Freq: Two times a day (BID) | ORAL | 3 refills | Status: DC

## 2021-10-07 NOTE — Patient Instructions (Signed)
Start ferrous sulfate 325 mg twice daily. Start MiraLax and Colace stool softener if constipation    Iron Capsules or Tablets (Supplement) What is this medication? IRON (EYE ern) prevents and treats low levels of iron in your body. Iron is a mineral that plays an important role in making red blood cells, which carry oxygen from your lungs to the rest of your body. This medicine may be used for other purposes; ask your health care provider or pharmacist if you have questions. COMMON BRAND NAME(S): Berneda Rose Ferrous Sulfate, Bifera, Duofer, EZFE, Feosol, Feosol Complete, [employer], [place], [place], [place], [place], [place] 150, Ferrex-150, Ferric X-150, Ferrimin, Ferro-Sequels, Ferrocite, [place], [place], iFerex 150, Nephro-Fer, Niferex, NovaFerrum, Nu-Iron, Poly-Iron, ProFe, Proferrin ES, Slow Fe, Slow Iron, Tandem What should I tell my care team before I take this medication? They need to know if you have any of these conditions: Bowel disease Frequently drink alcohol Hemolytic anemia Iron overload (hemochromatosis, hemosiderosis) Liver disease Problems with swallowing Stomach ulcer or other stomach problems An unusual or allergic reaction to iron, other medications, foods, dyes, or preservatives Pregnant or trying to get pregnant Breast-feeding How should I use this medication? Take this medication by mouth with a glass of water or fruit juice. Follow the directions on the prescription label. Swallow whole. Do not crush or chew. Take this medication in an upright or sitting position. Try to take any bedtime doses at least 10 minutes before lying down. You may take this medication with food. Take your medication at regular intervals. Do not take your medication more often than directed. Do not stop taking except on your care team's advice. Talk to your care team about the use of this medication in children. While this medication may be prescribed for selected conditions,  precautions do apply. Overdosage: If you think you have taken too much of this medicine contact a poison control center or emergency room at once. NOTE: This medicine is only for you. Do not share this medicine with others. What if I miss a dose? If you miss a dose, take it as soon as you can. If it is almost time for your next dose, take only that dose. Do not take double or extra doses. What may interact with this medication? If you are taking this iron product, you should not take iron in any other medication or dietary supplement. This medication may also interact with the following: Alendronate Antacids Cefdinir Chloramphenicol Cholestyramine Deferoxamine Dimercaprol Etidronate Medications for stomach ulcers or other stomach problems Pancreatic enzymes Quinolone antibiotics (examples: Cipro, Floxin, Levaquin, Tequin and others) Risedronate Tetracycline antibiotics (examples: doxycycline, tetracycline, minocycline, and others) Thyroid hormones This list may not describe all possible interactions. Give your health care provider a list of all the medicines, herbs, non-prescription drugs, or dietary supplements you use. Also tell them if you smoke, drink alcohol, or use illegal drugs. Some items may interact with your medicine. What should I watch for while using this medication? Use iron supplements only as directed by your care team. You will need important blood work while you are taking this medication. It may take 3 to 6 months of therapy to treat low iron levels. Pregnant women should follow the dose and length of iron treatment as directed by their care team. Do not use iron longer than prescribed, and do not take a higher dose than recommended. Long-term use may cause excess iron to build-up in the body. Do not take iron with antacids. If you need to take an  antacid, take it 2 hours after a dose of iron. What side effects may I notice from receiving this medication? Side effects  that you should report to your care team as soon as possible: Allergic reactions--skin rash, itching, hives, swelling of the face, lips, tongue, or throat Side effects that usually do not require medical attention (report to your care team if they continue or are bothersome): Constipation Dark stools Metallic taste in mouth Nausea Upset stomach This list may not describe all possible side effects. Call your doctor for medical advice about side effects. You may report side effects to FDA at 1-800-FDA-1088. Where should I keep my medication? Keep out of the reach of children. Even small amounts of iron can be harmful to a child. Store at room temperature between 15 and 30 degrees C (59 and 86 degrees F). Keep container tightly closed. Throw away any unused medication after the expiration date. NOTE: This sheet is a summary. It may not cover all possible information. If you have questions about this medicine, talk to your doctor, pharmacist, or health care provider.  2023 Elsevier/Gold Standard (2020-05-19 00:00:00)

## 2021-10-07 NOTE — Progress Notes (Signed)
  [facility] OFFICE PROGRESS NOTE   Diagnosis: Red cell microcytosis  INTERVAL HISTORY:   Ms. Lapka returns for a scheduled visit.  She reports malaise.  She has discomfort in the left lower abdomen for the past 2 weeks.  No difficulty with bowel function.  No bleeding.  The stool was Hemoccult negative in June.  A urinalysis was negative for blood.  She has been evaluated by her gynecologist and primary provider for the abdominal pain.  A CT abdomen/pelvis 10/05/2021 revealed no acute finding.  Similar nonobstructive nephrolithiasis in the left pelvic kidney. A CT renal stone study 09/20/2021 revealed no acute finding.  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 Objective:  Vital signs in last 24 hours:  Blood pressure (!) 148/86, pulse 80, temperature 98.2 F (36.8 C), temperature source Oral, resp. rate 18, height '5\' 1"'$  (1.549 m), weight 191 lb 12.8 oz (87 kg), last menstrual period 05/12/2011, SpO2 100 %.     Resp: Lungs clear bilaterally Cardio: Regular rate and rhythm GI: No hepatosplenomegaly, tender at the extreme left lower abdomen/inguinal region, no mass, no inguinal nodes Vascular: No leg edema   Lab Results:  Lab Results  Component Value Date   WBC 6.3 10/07/2021   HGB 12.8 10/07/2021   HCT 40.0 10/07/2021   MCV 78.4 (L) 10/07/2021   PLT 192 10/07/2021   NEUTROABS 3.4 10/07/2021    CMP  Lab Results  Component Value Date   NA 137 09/20/2021   K 3.7 09/20/2021   CL 105 09/20/2021   CO2 24 09/20/2021   GLUCOSE 215 (H) 09/20/2021   BUN 6 09/20/2021   CREATININE 0.70 09/20/2021   CALCIUM 9.3 09/20/2021   PROT 6.7 09/20/2021   ALBUMIN 3.6 09/20/2021   AST 32 09/20/2021   ALT 42 09/20/2021   ALKPHOS 120 09/20/2021   BILITOT 0.7 09/20/2021   GFRNONAA >60 09/20/2021   GFRAA 117 03/24/2020    Lab Results  Component Value Date   CEA 1.0 05/31/2019   CA125 15 05/31/2019    Medications: I have reviewed the  patient's current medications.   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  I suspect she has early iron deficiency, potentially related to subclinical bleeding while on Eliquis.  No source of blood loss has been identified.  I recommend she follow-up with her primary provider to evaluate for source of blood loss.  She will begin a trial of ferrous sulfate and return for a CBC and ferritin level in 2 months.  She will use MiraLAX and a stool softener as needed for constipation while taking iron.  She has left lower abdominal pain.  She has undergone CTs of the abdomen/pelvis on 2 occasions over the past month.  I recommended she follow-up with her primary provider, gynecologist, and gastroenterologist for further evaluation of the abdominal pain.  She may need a colonoscopy if the pain persists.  She last had a colonoscopy in 2020.   Betsy Coder, MD  10/07/2021  10:51 AM

## 2021-10-14 ENCOUNTER — Ambulatory Visit: Payer: Federal, State, Local not specified - PPO | Admitting: Physician Assistant

## 2021-10-15 ENCOUNTER — Ambulatory Visit (INDEPENDENT_AMBULATORY_CARE_PROVIDER_SITE_OTHER): Payer: Federal, State, Local not specified - PPO | Admitting: Physician Assistant

## 2021-10-15 ENCOUNTER — Encounter: Payer: Self-pay | Admitting: Physician Assistant

## 2021-10-15 VITALS — BP 103/66 | HR 84 | Temp 97.7°F | Ht 61.0 in | Wt 191.0 lb

## 2021-10-15 DIAGNOSIS — Z794 Long term (current) use of insulin: Secondary | ICD-10-CM

## 2021-10-15 DIAGNOSIS — E1169 Type 2 diabetes mellitus with other specified complication: Secondary | ICD-10-CM

## 2021-10-15 DIAGNOSIS — E785 Hyperlipidemia, unspecified: Secondary | ICD-10-CM

## 2021-10-15 DIAGNOSIS — I10 Essential (primary) hypertension: Secondary | ICD-10-CM

## 2021-10-15 LAB — POCT GLYCOSYLATED HEMOGLOBIN (HGB A1C): Hemoglobin A1C: 7.7 % — AB (ref 4.0–5.6)

## 2021-10-15 MED ORDER — XIGDUO XR 10-1000 MG PO TB24: 1.0000 | ORAL_TABLET | Freq: Every day | ORAL | 0 refills | Status: DC

## 2021-10-15 MED ORDER — INSULIN ASPART 100 UNIT/ML IJ SOLN
INTRAMUSCULAR | 1 refills | Status: DC
Start: 2021-10-15 — End: 2021-12-22

## 2021-10-15 MED ORDER — OZEMPIC (0.25 OR 0.5 MG/DOSE) 2 MG/1.5ML ~~LOC~~ SOPN: 0.5000 mg | PEN_INJECTOR | SUBCUTANEOUS | 1 refills | Status: DC

## 2021-10-15 NOTE — Progress Notes (Signed)
Established patient visit   Patient: Morgan [name]   DOB: 11/30/1965   56 y.o. Female  MRN: [id number] Visit Date: 10/15/2021  Chief Complaint  Patient presents with   Follow-up   Subjective    HPI  Patient presents for chronic follow-up visit.  Diabetes: No increased urination or thirst. Patient reports stopped glipizide and has missed a few insulin doses before dinner. Has also been eating out more. Reports injecting 0.5 mg of Ozempic once a week. No hypoglycemic events. Checking glucose at home. FBS range from 100-140s.   HTN: No new chest pain, palpitations, dizziness or lower extremity swelling. Taking medications as directed without issues.   HLD: Pt taking medication as directed without issues. No myalgias or muscle weakness.  Medications: Outpatient Medications Prior to Visit  Medication Sig Note   acetaminophen (TYLENOL) 500 MG tablet Take 1,000 mg by mouth as needed. Takes 2-3 times weekly    ALPRAZOLAM XR 1 MG 24 hr tablet Take 1 mg by mouth every morning.    aluminum-magnesium hydroxide-simethicone (MAALOX) 250-037-04 MG/5ML SUSP Take 30 mLs by mouth 4 (four) times daily -  before meals and at bedtime.    apixaban (ELIQUIS) 5 MG TABS tablet Take 1 tablet (5 mg total) by mouth 2 (two) times daily.    ARIPiprazole (ABILIFY) 15 MG tablet Take 15 mg by mouth at bedtime.    atorvastatin (LIPITOR) 40 MG tablet Take 1 tablet (40 mg total) by mouth daily.    clonazePAM (KLONOPIN) 1 MG tablet Take 1 mg by mouth daily as needed for anxiety.    diltiazem (CARDIZEM CD) 120 MG 24 hr capsule Take 1 capsule (120 mg total) by mouth daily.    DULoxetine (CYMBALTA) 60 MG capsule Take 60 mg by mouth at bedtime.    famotidine (PEPCID) 20 MG tablet Take 1 tablet (20 mg total) by mouth 2 (two) times daily.    ferrous sulfate 325 (65 FE) MG EC tablet Take 1 tablet (325 mg total) by mouth 2 (two) times daily.    losartan (COZAAR) 100 MG tablet Take 1 tablet (100 mg total) by mouth daily.  Take 1 tablet daily by mouth.    omeprazole (PRILOSEC) 20 MG capsule Take 20 mg by mouth daily.    ondansetron (ZOFRAN-ODT) 4 MG disintegrating tablet '4mg'$  ODT q4 hours prn nausea/vomit (Patient taking differently: '4mg'$  ODT q4 hours prn nausea/vomit)    tiZANidine (ZANAFLEX) 4 MG tablet Take 1 tablet by mouth at bedtime as needed.    traMADol (ULTRAM) 50 MG tablet Take 1 tablet (50 mg total) by mouth every 6 (six) hours as needed.    [DISCONTINUED] glipiZIDE (GLUCOTROL) 5 MG tablet Take 1 tablet (5 mg total) by mouth daily before supper. 10/15/2021: pt stopped medication   [DISCONTINUED] insulin aspart (NOVOLOG) 100 UNIT/ML injection Inject 10 Units into the skin 3 (three) times daily before meals.    [DISCONTINUED] NOVOLOG FLEXPEN 100 UNIT/ML FlexPen INJECT 10 UNITS INTO SKIN 3 TIMES A DAY    [DISCONTINUED] Semaglutide,0.25 or 0.'5MG'$ /DOS, (OZEMPIC, 0.25 OR 0.5 MG/DOSE,) 2 MG/1.5ML SOPN Inject 0.25 mg into the skin once weekly x 4 weeks. Then inject 0.5 mg into the skin once weekly.    [DISCONTINUED] XIGDUO XR 11-998 MG TB24 TAKE 1 TABLET BY MOUTH EVERY DAY    No facility-administered medications prior to visit.    Review of Systems Review of Systems:  A fourteen system review of systems was performed and found to be positive as per HPI.  Last CBC Lab Results  Component Value Date   WBC 6.3 10/07/2021   HGB 12.8 10/07/2021   HCT 40.0 10/07/2021   MCV 78.4 (L) 10/07/2021   MCH 25.1 (L) 10/07/2021   RDW 14.9 10/07/2021   PLT 192 00/17/4944   Last metabolic panel Lab Results  Component Value Date   GLUCOSE 215 (H) 09/20/2021   NA 137 09/20/2021   K 3.7 09/20/2021   CL 105 09/20/2021   CO2 24 09/20/2021   BUN 6 09/20/2021   CREATININE 0.70 09/20/2021   GFRNONAA >60 09/20/2021   CALCIUM 9.3 09/20/2021   PROT 6.7 09/20/2021   ALBUMIN 3.6 09/20/2021   LABGLOB 2.7 06/24/2021   AGRATIO 1.6 06/24/2021   BILITOT 0.7 09/20/2021   ALKPHOS 120 09/20/2021   AST 32 09/20/2021   ALT 42  09/20/2021   ANIONGAP 8 09/20/2021   Last lipids Lab Results  Component Value Date   CHOL 141 04/07/2021   HDL 39 (L) 04/07/2021   LDLCALC 80 04/07/2021   TRIG 121 04/07/2021   CHOLHDL 3.6 04/07/2021   Last hemoglobin A1c Lab Results  Component Value Date   HGBA1C 7.7 (A) 10/15/2021   Last thyroid functions Lab Results  Component Value Date   TSH 1.250 06/24/2021   T4TOTAL 8.2 08/25/2018   Last vitamin D No results found for: "25OHVITD2", "25OHVITD3", "VD25OH"     Objective    BP 103/66   Pulse 84   Temp 97.7 F (36.5 C)   Ht '5\' 1"'$  (1.549 m)   Wt 191 lb (86.6 kg)   LMP 05/12/2011   SpO2 97%   BMI 36.09 kg/m  BP Readings from Last 3 Encounters:  10/15/21 103/66  10/07/21 (!) 148/86  09/20/21 120/69   Wt Readings from Last 3 Encounters:  10/15/21 191 lb (86.6 kg)  10/07/21 191 lb 12.8 oz (87 kg)  09/20/21 185 lb (83.9 kg)    Physical Exam  General:  Cooperative, in no acute distress, appropriate for stated age.  Neuro:  Alert and oriented,  extra-ocular muscles intact  HEENT:  Normocephalic, atraumatic, neck supple  Skin:  no gross rash, warm, pink. Cardiac:  RRR, S1 S2 Respiratory: CTA B/L  Vascular:  Ext warm, no cyanosis apprec.; cap RF less 2 sec. Psych:  No HI/SI, judgement and insight good, Euthymic mood. Full Affect.   Results for orders placed or performed in visit on 10/15/21  POCT glycosylated hemoglobin (Hb A1C)  Result Value Ref Range   Hemoglobin A1C 7.7 (A) 4.0 - 5.6 %   HbA1c POC (<> result, manual entry)     HbA1c, POC (prediabetic range)     HbA1c, POC (controlled diabetic range)      Assessment & Plan      Problem List Items Addressed This Visit       Cardiovascular and Mediastinum   Essential hypertension    -BP today stable. Continue current medication regimen. Will continue to monitor.        Endocrine   Type 2 diabetes mellitus with other specified complication (HCC) - Primary    -A1c increased from 7.2 to 7.7  likely secondary to medication noncompliance and poor diet. Will increase insulin (Novolog) to 13 units before supper and continue with 10 units before breakfast and lunch. Advised to monitor for hypoglycemia and continue monitoring carbohydrate/glucose intake. Continue Xigduo XR 11-998 mg daily and Ozempic 0.5 mg once a week. Recommend reassessing A1c and medication therapy in 3 months.  Relevant Medications   Dapagliflozin-metFORMIN HCl ER (XIGDUO XR) 11-998 MG TB24   Semaglutide,0.25 or 0.'5MG'$ /DOS, (OZEMPIC, 0.25 OR 0.5 MG/DOSE,) 2 MG/1.5ML SOPN   insulin aspart (NOVOLOG) 100 UNIT/ML injection   Other Relevant Orders   POCT glycosylated hemoglobin (Hb A1C) (Completed)   Hyperlipidemia associated with type 2 diabetes mellitus (HCC)    -Last lipid panel: HDL 39, LDL 80 -Discussed increasing physical activity level.  -Continue current medication regimen. -Recommend repeating lipid panel and hepatic function at follow-up visit.       Relevant Medications   Dapagliflozin-metFORMIN HCl ER (XIGDUO XR) 11-998 MG TB24   Semaglutide,0.25 or 0.'5MG'$ /DOS, (OZEMPIC, 0.25 OR 0.5 MG/DOSE,) 2 MG/1.5ML SOPN   insulin aspart (NOVOLOG) 100 UNIT/ML injection    Return in about 3 months (around 01/14/2022) for DM-inc med, HTN, HLD, mood and FBW (lipid panel, cmp, a1c, cbc).        Lorrene Reid, PA-C  [facility] [phone] (phone) [phone] (fax)  [place]

## 2021-10-15 NOTE — Assessment & Plan Note (Addendum)
-  Last lipid panel: HDL 39, LDL 80 -Discussed increasing physical activity level.  -Continue current medication regimen. -Recommend repeating lipid panel and hepatic function at follow-up visit.

## 2021-10-15 NOTE — Assessment & Plan Note (Signed)
-  A1c increased from 7.2 to 7.7 likely secondary to medication noncompliance and poor diet. Will increase insulin (Novolog) to 13 units before supper and continue with 10 units before breakfast and lunch. Advised to monitor for hypoglycemia and continue monitoring carbohydrate/glucose intake. Continue Xigduo XR 11-998 mg daily and Ozempic 0.5 mg once a week. Recommend reassessing A1c and medication therapy in 3 months.

## 2021-10-15 NOTE — Patient Instructions (Signed)
Depression and Anxiety You will learn about mood disorders, how these conditions can occur in people with heart disease, and why it is important to treat these conditions. To view the content, go to this web address: [email or web address]  This video will expire on: 08/08/2023. If you need access to this video following this date, please reach out to the healthcare provider who assigned it to you. This information is not intended to replace advice given to you by your health care provider. Make sure you discuss any questions you have with your health care provider. Elsevier Patient Education  [place].

## 2021-10-15 NOTE — Assessment & Plan Note (Signed)
-  BP today stable. Continue current medication regimen. Will continue to monitor.

## 2021-11-04 ENCOUNTER — Other Ambulatory Visit: Payer: Self-pay | Admitting: Physician Assistant

## 2021-11-23 ENCOUNTER — Other Ambulatory Visit: Payer: Self-pay | Admitting: General Surgery

## 2021-11-23 DIAGNOSIS — Z1231 Encounter for screening mammogram for malignant neoplasm of breast: Secondary | ICD-10-CM

## 2021-11-24 ENCOUNTER — Other Ambulatory Visit: Payer: Self-pay | Admitting: General Surgery

## 2021-11-24 ENCOUNTER — Ambulatory Visit
Admission: RE | Admit: 2021-11-24 | Discharge: 2021-11-24 | Disposition: A | Discharge status: Home / Self Care | Payer: Federal, State, Local not specified - PPO | Source: Ambulatory Visit | Attending: General Surgery | Admitting: General Surgery

## 2021-11-24 DIAGNOSIS — Z1231 Encounter for screening mammogram for malignant neoplasm of breast: Secondary | ICD-10-CM

## 2021-11-24 DIAGNOSIS — N63 Unspecified lump in unspecified breast: Secondary | ICD-10-CM

## 2021-12-01 ENCOUNTER — Other Ambulatory Visit: Payer: Self-pay | Admitting: General Surgery

## 2021-12-01 ENCOUNTER — Ambulatory Visit
Admission: RE | Admit: 2021-12-01 | Discharge: 2021-12-01 | Disposition: A | Discharge status: Home / Self Care | Payer: Federal, State, Local not specified - PPO | Source: Ambulatory Visit | Attending: General Surgery | Admitting: General Surgery

## 2021-12-01 DIAGNOSIS — N63 Unspecified lump in unspecified breast: Secondary | ICD-10-CM

## 2021-12-10 ENCOUNTER — Inpatient Hospital Stay: Payer: Federal, State, Local not specified - PPO | Admitting: Nurse Practitioner

## 2021-12-10 ENCOUNTER — Inpatient Hospital Stay: Payer: Federal, State, Local not specified - PPO

## 2021-12-21 ENCOUNTER — Telehealth: Payer: Self-pay | Admitting: Physician Assistant

## 2021-12-21 DIAGNOSIS — Z794 Long term (current) use of insulin: Secondary | ICD-10-CM

## 2021-12-21 NOTE — Telephone Encounter (Signed)
Duplicate request

## 2021-12-22 MED ORDER — INSULIN ASPART 100 UNIT/ML IJ SOLN: INTRAMUSCULAR | 1 refills | Status: DC

## 2021-12-22 NOTE — Telephone Encounter (Signed)
Pt is calling wanting an update on why her Novolog pen were denied.   Pt states she doesn't have enough to get her to her 11/30 appt.  Please advise

## 2021-12-22 NOTE — Telephone Encounter (Signed)
Refill has been sent. Originally denied due to system flagging that it was a duplicate request.

## 2021-12-23 MED ORDER — INSULIN ASPART 100 UNIT/ML IJ SOLN: INTRAMUSCULAR | 1 refills | Status: DC

## 2021-12-23 NOTE — Telephone Encounter (Signed)
Pt called to check the status of the refill.   I advised her that we did attempt to send but for some reason it was submitted electronically.  The class says no print... Can you try to resend it?  Please advise

## 2021-12-23 NOTE — Addendum Note (Signed)
Addended by: Gemma Payor on: 12/23/2021 03:36 PM   Modules accepted: Orders

## 2021-12-23 NOTE — Telephone Encounter (Signed)
I changed it to say "Normal" instead of "No Print". Prescription has been resent to pharmacy.

## 2021-12-24 ENCOUNTER — Telehealth: Payer: Self-pay | Admitting: *Deleted

## 2021-12-24 ENCOUNTER — Other Ambulatory Visit: Payer: Self-pay

## 2021-12-24 DIAGNOSIS — E1169 Type 2 diabetes mellitus with other specified complication: Secondary | ICD-10-CM

## 2021-12-24 MED ORDER — NOVOLOG FLEXPEN 100 UNIT/ML ~~LOC~~ SOPN: PEN_INJECTOR | SUBCUTANEOUS | 0 refills | Status: DC

## 2021-12-24 NOTE — Telephone Encounter (Signed)
Pt called and stated that her insulin Rx that was sent in yesterday was wrong, she said it was for the vials. Pt says that she uses the flex pens.  Please resend correct Rx to pharmacy.Vartan Kerins Zimmerman Rumple, CMA

## 2021-12-30 ENCOUNTER — Encounter (HOSPITAL_COMMUNITY): Payer: Self-pay | Admitting: Physician Assistant

## 2021-12-30 ENCOUNTER — Ambulatory Visit (HOSPITAL_COMMUNITY)
Admission: RE | Admit: 2021-12-30 | Discharge: 2021-12-30 | Disposition: A | Discharge status: Home / Self Care | Payer: Federal, State, Local not specified - PPO | Source: Ambulatory Visit | Attending: Physician Assistant | Admitting: Physician Assistant

## 2021-12-30 VITALS — BP 118/88 | HR 81 | Ht 61.0 in | Wt 194.2 lb

## 2021-12-30 DIAGNOSIS — I10 Essential (primary) hypertension: Secondary | ICD-10-CM | POA: Insufficient documentation

## 2021-12-30 DIAGNOSIS — Z6836 Body mass index (BMI) 36.0-36.9, adult: Secondary | ICD-10-CM | POA: Diagnosis not present

## 2021-12-30 DIAGNOSIS — Z7984 Long term (current) use of oral hypoglycemic drugs: Secondary | ICD-10-CM | POA: Diagnosis not present

## 2021-12-30 DIAGNOSIS — E118 Type 2 diabetes mellitus with unspecified complications: Secondary | ICD-10-CM | POA: Diagnosis not present

## 2021-12-30 DIAGNOSIS — I480 Paroxysmal atrial fibrillation: Secondary | ICD-10-CM | POA: Diagnosis not present

## 2021-12-30 DIAGNOSIS — E669 Obesity, unspecified: Secondary | ICD-10-CM | POA: Insufficient documentation

## 2021-12-30 DIAGNOSIS — Z7901 Long term (current) use of anticoagulants: Secondary | ICD-10-CM | POA: Diagnosis not present

## 2021-12-30 DIAGNOSIS — Z79899 Other long term (current) drug therapy: Secondary | ICD-10-CM | POA: Insufficient documentation

## 2021-12-30 DIAGNOSIS — I4892 Unspecified atrial flutter: Secondary | ICD-10-CM | POA: Insufficient documentation

## 2021-12-30 DIAGNOSIS — Z794 Long term (current) use of insulin: Secondary | ICD-10-CM | POA: Diagnosis not present

## 2021-12-30 DIAGNOSIS — R0789 Other chest pain: Secondary | ICD-10-CM | POA: Insufficient documentation

## 2021-12-30 DIAGNOSIS — D6869 Other thrombophilia: Secondary | ICD-10-CM | POA: Insufficient documentation

## 2021-12-30 NOTE — Progress Notes (Signed)
Primary Care Physician: Lorrene Reid, PA-C Primary Cardiologist: Dr Irish Lack  Primary Electrophysiologist: Dr Rayann Heman Referring Physician: Dr Hattie Perch is a 56 y.o. female with a history of HTN, atrial flutter, DM, and atrial fibrillation who presents for follow up in the [facility]. She was seen by him 07/08/20 and complained of intermittent palpitations.  She had a zio monitor placed.  This recorded rare PACs, PVCs as well as afib with burden of <1%.  The longest event was 49 min and 13 seconds.  There was also a more regular arrhythmia which was likely atrial flutter. Seen by Dr Rayann Heman on 11/03/20 and started on Eliquis for a CHADS2VASC score of 3 and diltiazem.  On follow up today, patient reports that her palpitations have improved since her last visit. She will notice very brief palpitations (< 2 minutes) about once per week. She has been off Eliquis for about two months due to cost concerns. She does describe chest discomfort on the left side of her chest which is sharp/stabbing pain. It is not related to activity and is near constant. She went to ED 08/2021 for chest pain and workup was negative for ACS.   Today, she denies symptoms of shortness of breath, orthopnea, PND, lower extremity edema, dizziness, presyncope, syncope, snoring, daytime somnolence, bleeding, or neurologic sequela. The patient is tolerating medications without difficulties and is otherwise without complaint today.    Atrial Fibrillation Risk Factors:  she does not have symptoms or diagnosis of sleep apnea. she does not have a history of rheumatic fever. she does not have a history of alcohol use.   she has a BMI of Body mass index is 36.69 kg/m.Marland Kitchen Filed Weights   12/30/21 1509  Weight: 88.1 kg    Family History  Problem Relation Age of Onset   Diabetes Mother    Breast cancer Mother 28   Thyroid disease Mother    Depression Mother    Hypertension Father     Heart disease Father    Heart attack Father 73       Two instances, first at age 71   Stroke Father    Alcohol abuse Father    Throat cancer Father    Diabetes Maternal Grandmother    Depression Maternal Grandmother    Uterine cancer Maternal Grandmother      Atrial Fibrillation Management history:  Previous antiarrhythmic drugs: none Previous cardioversions: none Previous ablations: none CHADS2VASC score: 3 Anticoagulation history: Eliquis   Past Medical History:  Diagnosis Date   Anxiety    Atrial fibrillation ([place])    Bipolar disorder ([place])    Breast mass    right, removed and benign   Cancer ([place]) 09/16/2019   ovarian ca no radiation no chemo   Cervical dysplasia    Diabetes mellitus without complication (HCC)    Endometriosis    GERD (gastroesophageal reflux disease)    Hypercholesteremia    Hypertension    Migraine    Ovarian cancer ([place])    Pelvic kidney    left   Postpartum depression    hx of   Past Surgical History:  Procedure Laterality Date   ABDOMINAL HYSTERECTOMY N/A    Phreesia 07/30/2019   BACK SURGERY  02/16/2012   BREAST BIOPSY Right 2016   BREAST BIOPSY Right 2020   BREAST EXCISIONAL BIOPSY Right 2021   BREAST EXCISIONAL BIOPSY Right 2016   benign   BREAST LUMPECTOMY WITH RADIOACTIVE SEED LOCALIZATION Right  10/28/2014   Procedure: RIGHT BREAST LUMPECTOMY WITH RADIOACTIVE SEED LOCALIZATION;  Surgeon: [name], MD;  Location: [facility];  Service: General;  Laterality: Right;   BREAST LUMPECTOMY WITH RADIOACTIVE SEED LOCALIZATION Right 06/18/2019   Procedure: RIGHT BREAST RADIOACTIVE SEED LOCALIZATION LUMPECTOMY;  Surgeon: Jovita Kussmaul, MD;  Location: [facility];  Service: General;  Laterality: Right;   BREAST SURGERY N/A    Phreesia 07/30/2019   CESAREAN SECTION  02/15/2001   COLONOSCOPY WITH PROPOFOL N/A 05/01/2018   Procedure: COLONOSCOPY WITH PROPOFOL;  Surgeon: [name], MD;  Location: [facility]  ENDOSCOPY;  Service: Gastroenterology;  Laterality: N/A;   COLPOSCOPY     ESOPHAGOGASTRODUODENOSCOPY (EGD) WITH PROPOFOL N/A 05/01/2018   Procedure: ESOPHAGOGASTRODUODENOSCOPY (EGD) WITH PROPOFOL;  Surgeon: [name], MD;  Location: [facility] ENDOSCOPY;  Service: Gastroenterology;  Laterality: N/A;   ESOPHAGOGASTRODUODENOSCOPY (EGD) WITH PROPOFOL N/A 11/13/2018   Procedure: ESOPHAGOGASTRODUODENOSCOPY (EGD) WITH PROPOFOL;  Surgeon: [name], MD;  Location[facility] ENDOSCOPY;  Service: Gastroenterology;  Laterality: N/A;   GYNECOLOGIC CRYOSURGERY     NECK SURGERY  02/15/2010   PELVIC LAPAROSCOPY  02/15/1989    Current Outpatient Medications  Medication Sig Dispense Refill   acetaminophen (TYLENOL) 500 MG tablet Take 1,000 mg by mouth as needed. Takes 2-3 times weekly     ALPRAZOLAM XR 1 MG 24 hr tablet Take 1 mg by mouth every morning.     aluminum-magnesium hydroxide-simethicone (MAALOX) 915-056-97 MG/5ML SUSP Take 30 mLs by mouth 4 (four) times daily -  before meals and at bedtime. 355 mL 0   ARIPiprazole (ABILIFY) 15 MG tablet Take 15 mg by mouth at bedtime.     atorvastatin (LIPITOR) 40 MG tablet Take 1 tablet (40 mg total) by mouth daily. 90 tablet 1   clonazePAM (KLONOPIN) 1 MG tablet Take 1 mg by mouth daily as needed for anxiety.     Dapagliflozin-metFORMIN HCl ER (XIGDUO XR) 11-998 MG TB24 Take 1 tablet by mouth daily. 90 tablet 0   diltiazem (CARDIZEM CD) 120 MG 24 hr capsule Take 1 capsule (120 mg total) by mouth daily. 90 capsule 3   DULoxetine (CYMBALTA) 60 MG capsule Take 60 mg by mouth at bedtime.  5   famotidine (PEPCID) 20 MG tablet Take 1 tablet (20 mg total) by mouth 2 (two) times daily. 60 tablet 0   ferrous sulfate 325 (65 FE) MG EC tablet Take 1 tablet (325 mg total) by mouth 2 (two) times daily.  3   insulin aspart (NOVOLOG FLEXPEN) 100 UNIT/ML FlexPen Inject 10 units into the skin before breakfast and lunch. Inject 13 units into the skin before supper. 15 mL 0   losartan  (COZAAR) 100 MG tablet Take 1 tablet (100 mg total) by mouth daily. Take 1 tablet daily by mouth. 90 tablet 1   omeprazole (PRILOSEC) 20 MG capsule Take 20 mg by mouth daily.     ondansetron (ZOFRAN-ODT) 4 MG disintegrating tablet '4mg'$  ODT q4 hours prn nausea/vomit (Patient taking differently: '4mg'$  ODT q4 hours prn nausea/vomit) 20 tablet 0   Semaglutide,0.25 or 0.'5MG'$ /DOS, (OZEMPIC, 0.25 OR 0.5 MG/DOSE,) 2 MG/1.5ML SOPN Inject 0.5 mg into the skin once a week. 3 mL 1   tiZANidine (ZANAFLEX) 4 MG tablet Take 1 tablet by mouth at bedtime as needed.     traMADol (ULTRAM) 50 MG tablet Take 1 tablet (50 mg total) by mouth every 6 (six) hours as needed. 10 tablet 0   apixaban (ELIQUIS) 5 MG TABS tablet Take  1 tablet (5 mg total) by mouth 2 (two) times daily. (Patient not taking: Reported on 12/30/2021) 180 tablet 3   No current facility-administered medications for this encounter.    Allergies  Allergen Reactions   Clindamycin/Lincomycin Rash    Diffuse drug reaction eruption   Other Hives, Rash and Itching   Lamotrigine Rash    Social History   Socioeconomic History   Marital status: Married    Spouse name: Not on file   Number of children: Not on file   Years of education: Not on file   Highest education level: Not on file  Occupational History   Not on file  Tobacco Use   Smoking status: Never   Smokeless tobacco: Never   Tobacco comments:    Never smoke 07/01/21  Vaping Use   Vaping Use: Never used  Substance and Sexual Activity   Alcohol use: No   Drug use: No   Sexual activity: Yes    Partners: Male    Birth control/protection: None    Comment: vasectomy  Other Topics Concern   Not on file  Social History Narrative   Lives in [place] with spouse and son.   Disabled.   Social Determinants of Health   Financial Resource Strain: Not on file  Food Insecurity: Not on file  Transportation Needs: Not on file  Physical Activity: Not on file  Stress: Not on file  Social  Connections: Not on file  Intimate Partner Violence: Not on file     ROS- All systems are reviewed and negative except as per the HPI above.  Physical Exam: Vitals:   12/30/21 1509  BP: 118/88  Pulse: 81  Weight: 88.1 kg  Height: '5\' 1"'$  (1.549 m)    GEN- The patient is a well appearing obese female, alert and oriented x 3 today.   HEENT-head normocephalic, atraumatic, sclera clear, conjunctiva pink, hearing intact, trachea midline. Lungs- Clear to ausculation bilaterally, normal work of breathing Heart- Regular rate and rhythm, no murmurs, rubs or gallops  GI- soft, NT, ND, + BS Extremities- no clubbing, cyanosis, or edema MS- no significant deformity or atrophy Skin- no rash or lesion Psych- euthymic mood, full affect Neuro- strength and sensation are intact   Wt Readings from Last 3 Encounters:  12/30/21 88.1 kg  10/15/21 86.6 kg  10/07/21 87 kg    EKG today demonstrates  SR Vent. rate 81 BPM PR interval 160 ms QRS duration 78 ms QT/QTcB 344/399 ms  Echo 08/06/20 demonstrated   1. Left ventricular ejection fraction, by estimation, is 65 to 70%. The  left ventricle has normal function. The left ventricle has no regional  wall motion abnormalities. There is mild left ventricular hypertrophy.  Left ventricular diastolic parameters were normal.   2. Right ventricular systolic function is normal. The right ventricular  size is normal. Tricuspid regurgitation signal is inadequate for assessing PA pressure.   3. The mitral valve is normal in structure. No evidence of mitral valve  regurgitation. No evidence of mitral stenosis.   4. The aortic valve is tricuspid. Aortic valve regurgitation is not  visualized. No aortic stenosis is present.   Epic records are reviewed at length today  CHA2DS2-VASc Score = 3  The patient's score is based upon: CHF History: 0 HTN History: 1 Diabetes History: 1 Stroke History: 0 Vascular Disease History: 0 Age Score: 0 Gender Score:  1       ASSESSMENT AND PLAN: 1. Paroxysmal Atrial Fibrillation/atrial flutter The patient's  CHA2DS2-VASc score is 3, indicating a 3.2% annual risk of stroke.   Patient appears to be maintaining SR with only very brief palpitations.  Continue diltiazem 120 mg daily Resume Eliquis 5 mg BID, samples and copay card given today.   2. Secondary Hypercoagulable State (ICD10:  D68.69) The patient is at significant risk for stroke/thromboembolism based upon her CHA2DS2-VASc Score of 3.  Continue Apixaban (Eliquis).   3. Obesity Body mass index is 36.69 kg/m. Lifestyle modification was discussed and encouraged including regular physical activity and weight reduction.  4. HTN Stable, no changes today.  5. Chest discomfort Chest pain appears to be chronic with atypical features She did have a CT scan in 2019 which showed CAC score of 0. She does have risk factors for CAD with DM and HLD. Will have her follow up with Dr Irish Lack or APP to determine necessity of ischemic evaluation.    Follow up in the AF clinic in 6 months.    [facility] [address] [place], [place] [address] [phone] 12/30/2021 3:31 PM

## 2022-01-01 ENCOUNTER — Other Ambulatory Visit: Payer: Self-pay | Admitting: Physician Assistant

## 2022-01-01 DIAGNOSIS — Z794 Long term (current) use of insulin: Secondary | ICD-10-CM

## 2022-01-06 ENCOUNTER — Other Ambulatory Visit: Payer: Self-pay

## 2022-01-06 ENCOUNTER — Encounter (HOSPITAL_COMMUNITY): Payer: Self-pay

## 2022-01-06 ENCOUNTER — Emergency Department (HOSPITAL_COMMUNITY)
Admission: EM | Admit: 2022-01-06 | Discharge: 2022-01-06 | Disposition: A | Discharge status: Home / Self Care | Payer: Federal, State, Local not specified - PPO | Attending: Emergency Medicine | Admitting: Emergency Medicine

## 2022-01-06 ENCOUNTER — Emergency Department (HOSPITAL_COMMUNITY): Payer: Federal, State, Local not specified - PPO

## 2022-01-06 DIAGNOSIS — R079 Chest pain, unspecified: Secondary | ICD-10-CM | POA: Insufficient documentation

## 2022-01-06 DIAGNOSIS — R059 Cough, unspecified: Secondary | ICD-10-CM | POA: Insufficient documentation

## 2022-01-06 DIAGNOSIS — Z5321 Procedure and treatment not carried out due to patient leaving prior to being seen by health care provider: Secondary | ICD-10-CM | POA: Insufficient documentation

## 2022-01-06 DIAGNOSIS — R0981 Nasal congestion: Secondary | ICD-10-CM | POA: Insufficient documentation

## 2022-01-06 LAB — CBC
HCT: 41.7 % (ref 36.0–46.0)
Hemoglobin: 13.9 g/dL (ref 12.0–15.0)
MCH: 27.1 pg (ref 26.0–34.0)
MCHC: 33.3 g/dL (ref 30.0–36.0)
MCV: 81.3 fL (ref 80.0–100.0)
Platelets: 186 10*3/uL (ref 150–400)
RBC: 5.13 MIL/uL — ABNORMAL HIGH (ref 3.87–5.11)
RDW: 14.5 % (ref 11.5–15.5)
WBC: 6 10*3/uL (ref 4.0–10.5)
nRBC: 0 % (ref 0.0–0.2)

## 2022-01-06 LAB — PROTIME-INR
INR: 0.9 (ref 0.8–1.2)
Prothrombin Time: 11.9 seconds (ref 11.4–15.2)

## 2022-01-06 LAB — I-STAT BETA HCG BLOOD, ED (MC, WL, AP ONLY): I-stat hCG, quantitative: <5 m[IU]/mL (ref <5)

## 2022-01-06 LAB — BASIC METABOLIC PANEL
Anion gap: 14 (ref 5–15)
BUN: 10 mg/dL (ref 6–20)
CO2: 22 mmol/L (ref 22–32)
Calcium: 9.8 mg/dL (ref 8.9–10.3)
Chloride: 103 mmol/L (ref 98–111)
Creatinine, Ser: 0.72 mg/dL (ref 0.44–1.00)
GFR, Estimated: >60 mL/min (ref >60)
Glucose, Bld: 178 mg/dL — ABNORMAL HIGH (ref 70–99)
Potassium: 3.3 mmol/L — ABNORMAL LOW (ref 3.5–5.1)
Sodium: 139 mmol/L (ref 135–145)

## 2022-01-06 LAB — TROPONIN I (HIGH SENSITIVITY)
Troponin I (High Sensitivity): 3 ng/L (ref <18)
Troponin I (High Sensitivity): 3 ng/L (ref <18)

## 2022-01-06 NOTE — ED Triage Notes (Signed)
Pt states she has had a cough and congestion x 6 days.  Pt states she started having chest pain and left arm tingling earlier tonight. Pt denies nausea, vomiting or shortness of breath.

## 2022-01-06 NOTE — ED Notes (Signed)
Pt name called 3x for updated vitals, no response.

## 2022-01-14 ENCOUNTER — Ambulatory Visit: Payer: Federal, State, Local not specified - PPO | Admitting: Nurse Practitioner

## 2022-02-18 ENCOUNTER — Ambulatory Visit: Payer: Federal, State, Local not specified - PPO | Admitting: Nurse Practitioner

## 2022-03-01 ENCOUNTER — Other Ambulatory Visit: Payer: Self-pay | Admitting: Internal Medicine

## 2022-03-01 DIAGNOSIS — I480 Paroxysmal atrial fibrillation: Secondary | ICD-10-CM

## 2022-03-01 NOTE — Telephone Encounter (Signed)
Prescription refill request for Eliquis received. Indication: Afib  Last office visit: 12/30/21 Marlene Lard)  Scr: 0.72 (01/06/22)  Age: 57 Weight: 88.5kg  Appropriate dose and refill sent to requested pharmacy

## 2022-03-02 ENCOUNTER — Other Ambulatory Visit: Payer: Self-pay

## 2022-03-02 ENCOUNTER — Emergency Department (HOSPITAL_COMMUNITY): Payer: Medicare Other

## 2022-03-02 ENCOUNTER — Inpatient Hospital Stay (HOSPITAL_COMMUNITY)
Admission: EM | Admit: 2022-03-02 | Discharge: 2022-03-04 | DRG: 394 | Disposition: A | Discharge status: Home / Self Care | Payer: Medicare Other | Attending: Internal Medicine | Admitting: Internal Medicine

## 2022-03-02 DIAGNOSIS — Z888 Allergy status to other drugs, medicaments and biological substances status: Secondary | ICD-10-CM

## 2022-03-02 DIAGNOSIS — Z808 Family history of malignant neoplasm of other organs or systems: Secondary | ICD-10-CM

## 2022-03-02 DIAGNOSIS — E7800 Pure hypercholesterolemia, unspecified: Secondary | ICD-10-CM | POA: Diagnosis present

## 2022-03-02 DIAGNOSIS — Z833 Family history of diabetes mellitus: Secondary | ICD-10-CM

## 2022-03-02 DIAGNOSIS — Z8249 Family history of ischemic heart disease and other diseases of the circulatory system: Secondary | ICD-10-CM

## 2022-03-02 DIAGNOSIS — Z7901 Long term (current) use of anticoagulants: Secondary | ICD-10-CM

## 2022-03-02 DIAGNOSIS — Z794 Long term (current) use of insulin: Secondary | ICD-10-CM

## 2022-03-02 DIAGNOSIS — E785 Hyperlipidemia, unspecified: Secondary | ICD-10-CM | POA: Diagnosis present

## 2022-03-02 DIAGNOSIS — I10 Essential (primary) hypertension: Secondary | ICD-10-CM | POA: Diagnosis present

## 2022-03-02 DIAGNOSIS — K529 Noninfective gastroenteritis and colitis, unspecified: Secondary | ICD-10-CM | POA: Diagnosis not present

## 2022-03-02 DIAGNOSIS — I480 Paroxysmal atrial fibrillation: Secondary | ICD-10-CM | POA: Diagnosis present

## 2022-03-02 DIAGNOSIS — K219 Gastro-esophageal reflux disease without esophagitis: Secondary | ICD-10-CM | POA: Diagnosis present

## 2022-03-02 DIAGNOSIS — F319 Bipolar disorder, unspecified: Secondary | ICD-10-CM | POA: Diagnosis not present

## 2022-03-02 DIAGNOSIS — K559 Vascular disorder of intestine, unspecified: Secondary | ICD-10-CM | POA: Diagnosis not present

## 2022-03-02 DIAGNOSIS — F419 Anxiety disorder, unspecified: Secondary | ICD-10-CM | POA: Diagnosis present

## 2022-03-02 DIAGNOSIS — F1010 Alcohol abuse, uncomplicated: Secondary | ICD-10-CM | POA: Diagnosis present

## 2022-03-02 DIAGNOSIS — Z811 Family history of alcohol abuse and dependence: Secondary | ICD-10-CM

## 2022-03-02 DIAGNOSIS — K625 Hemorrhage of anus and rectum: Secondary | ICD-10-CM | POA: Diagnosis not present

## 2022-03-02 DIAGNOSIS — Z881 Allergy status to other antibiotic agents status: Secondary | ICD-10-CM

## 2022-03-02 DIAGNOSIS — E1159 Type 2 diabetes mellitus with other circulatory complications: Secondary | ICD-10-CM

## 2022-03-02 DIAGNOSIS — Z8349 Family history of other endocrine, nutritional and metabolic diseases: Secondary | ICD-10-CM

## 2022-03-02 DIAGNOSIS — Z8543 Personal history of malignant neoplasm of ovary: Secondary | ICD-10-CM

## 2022-03-02 DIAGNOSIS — K922 Gastrointestinal hemorrhage, unspecified: Principal | ICD-10-CM

## 2022-03-02 DIAGNOSIS — Z86010 Personal history of colonic polyps: Secondary | ICD-10-CM

## 2022-03-02 DIAGNOSIS — Z791 Long term (current) use of non-steroidal anti-inflammatories (NSAID): Secondary | ICD-10-CM

## 2022-03-02 DIAGNOSIS — Z818 Family history of other mental and behavioral disorders: Secondary | ICD-10-CM

## 2022-03-02 DIAGNOSIS — Z79899 Other long term (current) drug therapy: Secondary | ICD-10-CM

## 2022-03-02 DIAGNOSIS — G43909 Migraine, unspecified, not intractable, without status migrainosus: Secondary | ICD-10-CM | POA: Diagnosis present

## 2022-03-02 DIAGNOSIS — Z823 Family history of stroke: Secondary | ICD-10-CM

## 2022-03-02 DIAGNOSIS — K31A0 Gastric intestinal metaplasia, unspecified: Secondary | ICD-10-CM | POA: Diagnosis present

## 2022-03-02 DIAGNOSIS — K760 Fatty (change of) liver, not elsewhere classified: Secondary | ICD-10-CM | POA: Diagnosis present

## 2022-03-02 DIAGNOSIS — Z8049 Family history of malignant neoplasm of other genital organs: Secondary | ICD-10-CM

## 2022-03-02 DIAGNOSIS — R1032 Left lower quadrant pain: Secondary | ICD-10-CM | POA: Diagnosis not present

## 2022-03-02 DIAGNOSIS — Z803 Family history of malignant neoplasm of breast: Secondary | ICD-10-CM

## 2022-03-02 DIAGNOSIS — I152 Hypertension secondary to endocrine disorders: Secondary | ICD-10-CM | POA: Diagnosis present

## 2022-03-02 DIAGNOSIS — E1169 Type 2 diabetes mellitus with other specified complication: Secondary | ICD-10-CM | POA: Diagnosis not present

## 2022-03-02 LAB — CBC
HCT: 41.6 % (ref 36.0–46.0)
Hemoglobin: 13.2 g/dL (ref 12.0–15.0)
MCH: 26.7 pg (ref 26.0–34.0)
MCHC: 31.7 g/dL (ref 30.0–36.0)
MCV: 84.2 fL (ref 80.0–100.0)
Platelets: 217 10*3/uL (ref 150–400)
RBC: 4.94 MIL/uL (ref 3.87–5.11)
RDW: 14.1 % (ref 11.5–15.5)
WBC: 9.4 10*3/uL (ref 4.0–10.5)
nRBC: 0 % (ref 0.0–0.2)

## 2022-03-02 LAB — COMPREHENSIVE METABOLIC PANEL
ALT: 25 U/L (ref 0–44)
AST: 28 U/L (ref 15–41)
Albumin: 3.5 g/dL (ref 3.5–5.0)
Alkaline Phosphatase: 95 U/L (ref 38–126)
Anion gap: 11 (ref 5–15)
BUN: 10 mg/dL (ref 6–20)
CO2: 25 mmol/L (ref 22–32)
Calcium: 9.4 mg/dL (ref 8.9–10.3)
Chloride: 104 mmol/L (ref 98–111)
Creatinine, Ser: 0.73 mg/dL (ref 0.44–1.00)
GFR, Estimated: >60 mL/min (ref >60)
Glucose, Bld: 117 mg/dL — ABNORMAL HIGH (ref 70–99)
Potassium: 3.5 mmol/L (ref 3.5–5.1)
Sodium: 140 mmol/L (ref 135–145)
Total Bilirubin: 0.5 mg/dL (ref 0.3–1.2)
Total Protein: 6.4 g/dL — ABNORMAL LOW (ref 6.5–8.1)

## 2022-03-02 LAB — URINALYSIS, ROUTINE W REFLEX MICROSCOPIC
Bilirubin Urine: NEGATIVE
Glucose, UA: >=500 mg/dL — AB
Ketones, ur: NEGATIVE mg/dL
Leukocytes,Ua: NEGATIVE
Nitrite: NEGATIVE
Protein, ur: NEGATIVE mg/dL
Specific Gravity, Urine: >1.046 — ABNORMAL HIGH (ref 1.005–1.030)
pH: 5 (ref 5.0–8.0)

## 2022-03-02 LAB — PROTIME-INR
INR: 1 (ref 0.8–1.2)
Prothrombin Time: 13.5 seconds (ref 11.4–15.2)

## 2022-03-02 LAB — TYPE AND SCREEN
ABO/RH(D): A POS
Antibody Screen: NEGATIVE

## 2022-03-02 LAB — I-STAT BETA HCG BLOOD, ED (MC, WL, AP ONLY): I-stat hCG, quantitative: 5.8 m[IU]/mL — ABNORMAL HIGH (ref <5)

## 2022-03-02 LAB — LIPASE, BLOOD: Lipase: 25 U/L (ref 11–51)

## 2022-03-02 MED ORDER — SODIUM CHLORIDE 0.9 % IV SOLN: INTRAVENOUS | Status: AC

## 2022-03-02 MED ORDER — MORPHINE SULFATE (PF) 2 MG/ML IV SOLN
2.0000 mg | INTRAVENOUS | Status: DC | PRN
  Administered 2022-03-02 – 2022-03-03 (×4): 2 mg via INTRAVENOUS
  Filled 2022-03-02 (×4): qty 1

## 2022-03-02 MED ORDER — ONDANSETRON HCL 4 MG/2ML IJ SOLN: 4.0000 mg | Freq: Four times a day (QID) | INTRAMUSCULAR | Status: DC | PRN

## 2022-03-02 MED ORDER — INSULIN ASPART 100 UNIT/ML IJ SOLN
0.0000 [IU] | Freq: Three times a day (TID) | INTRAMUSCULAR | Status: DC
  Administered 2022-03-03: 2 [IU] via SUBCUTANEOUS
  Administered 2022-03-03 – 2022-03-04 (×2): 1 [IU] via SUBCUTANEOUS

## 2022-03-02 MED ORDER — ONDANSETRON HCL 4 MG/2ML IJ SOLN
4.0000 mg | Freq: Once | INTRAMUSCULAR | Status: AC
  Administered 2022-03-02: 4 mg via INTRAVENOUS
  Filled 2022-03-02: qty 2

## 2022-03-02 MED ORDER — IOHEXOL 350 MG/ML SOLN
75.0000 mL | Freq: Once | INTRAVENOUS | Status: AC | PRN
  Administered 2022-03-02: 75 mL via INTRAVENOUS

## 2022-03-02 MED ORDER — DICYCLOMINE HCL 10 MG/ML IM SOLN
20.0000 mg | Freq: Once | INTRAMUSCULAR | Status: AC
  Administered 2022-03-02: 20 mg via INTRAMUSCULAR
  Filled 2022-03-02: qty 2

## 2022-03-02 NOTE — Assessment & Plan Note (Signed)
Continue statin pending med rec

## 2022-03-02 NOTE — ED Triage Notes (Signed)
Pt. Stated, At 0500 I woke up with stomach pain, N/V/D. I had 2 episodes of each N/D . The 2 times I had diarrhea I had bright red blood in the toilet. I take Elaquis for A-Fib.

## 2022-03-02 NOTE — H&P (Signed)
History and Physical    Patient: Morgan [name] NFA:[id number] DOB: 07-Jan-1966 DOA: 03/02/2022 DOS: the patient was seen and examined on 03/02/2022 PCP: Lorrene Reid, PA-C  Patient coming from: Home  Chief Complaint:  Chief Complaint  Patient presents with   Abdominal Pain   Emesis   Nausea   Diarrhea   Rectal Bleeding   HPI: Morgan [name] is a 57 y.o. female with medical history significant of Paroxymal atrial fibrillation on Eliquis, history of ovarian cancer s/p resection, HTN, T2DM, HLD, Bipolar disorder  who presents with abdominal pain, N/V, and bloody diarrhea.   Patient developed acute onset left lower quadrant pain earlier this morning.  This was associated with several episodes of nausea and nonbloody vomiting.  She then had 2 episodes of hematochezia and decided to present to the ED.  While in the ED, she had 2 episodes of rectal bleeding without any stool.  She is on Eliquis and started back on it 4 days ago after being off for several months due to financial constraints.  Has previously been on it for 5 months without issues of GI bleeding. Had up-to-date colonoscopy in 2020 with polyps removed were negative for dysplasia. No recent travel. No new foods.   In the ED, temp of 98.67F, HR 70s, BP 140s/59.  No leukocytosis. Hgb stable at 13.2.  BMP unremarkable.  CTA A/P showed focal short segment wall thickening with stranding along the proximal descending colon.   She was evaluated by [facility] gastroenterology prior to the results of her CTA with concerns of possible ischemic colitis.  They recommend obtaining GI panel pathogen which is also pending along with C. difficile testing.  She is okay to have clear liquid diet and symptomatic management overnight.  Hospitalist then consulted for admission. Review of Systems: As mentioned in the history of present illness. All other systems reviewed and are negative. Past Medical History:  Diagnosis Date   Anxiety     Atrial fibrillation ([place])    Bipolar disorder ([place])    Breast mass    right, removed and benign   Cancer ([place]) 09/16/2019   ovarian ca no radiation no chemo   Cervical dysplasia    Diabetes mellitus without complication (HCC)    Endometriosis    GERD (gastroesophageal reflux disease)    Hypercholesteremia    Hypertension    Migraine    Ovarian cancer ([place])    Pelvic kidney    left   Postpartum depression    hx of   Past Surgical History:  Procedure Laterality Date   ABDOMINAL HYSTERECTOMY N/A    Phreesia 07/30/2019   BACK SURGERY  02/16/2012   BREAST BIOPSY Right 2016   BREAST BIOPSY Right 2020   BREAST EXCISIONAL BIOPSY Right 2021   BREAST EXCISIONAL BIOPSY Right 2016   benign   BREAST LUMPECTOMY WITH RADIOACTIVE SEED LOCALIZATION Right 10/28/2014   Procedure: RIGHT BREAST LUMPECTOMY WITH RADIOACTIVE SEED LOCALIZATION;  Surgeon: [name], MD;  Location: [place];  Service: General;  Laterality: Right;   BREAST LUMPECTOMY WITH RADIOACTIVE SEED LOCALIZATION Right 06/18/2019   Procedure: RIGHT BREAST RADIOACTIVE SEED LOCALIZATION LUMPECTOMY;  Surgeon: Jovita Kussmaul, MD;  Location: [place];  Service: General;  Laterality: Right;   BREAST SURGERY N/A    Phreesia 07/30/2019   CESAREAN SECTION  02/15/2001   COLONOSCOPY WITH PROPOFOL N/A 05/01/2018   Procedure: COLONOSCOPY WITH PROPOFOL;  Surgeon: [name], MD;  Location: [facility] ENDOSCOPY;  Service: Gastroenterology;  Laterality: N/A;   COLPOSCOPY     ESOPHAGOGASTRODUODENOSCOPY (EGD) WITH PROPOFOL N/A 05/01/2018   Procedure: ESOPHAGOGASTRODUODENOSCOPY (EGD) WITH PROPOFOL;  Surgeon: [name], MD;  Location: [facility] ENDOSCOPY;  Service: Gastroenterology;  Laterality: N/A;   ESOPHAGOGASTRODUODENOSCOPY (EGD) WITH PROPOFOL N/A 11/13/2018   Procedure: ESOPHAGOGASTRODUODENOSCOPY (EGD) WITH PROPOFOL;  Surgeon: [name], MD;  Location: [facility] ENDOSCOPY;  Service: Gastroenterology;  Laterality: N/A;    GYNECOLOGIC CRYOSURGERY     NECK SURGERY  02/15/2010   PELVIC LAPAROSCOPY  02/15/1989   Social History:  reports that she has never smoked. She has never used smokeless tobacco. She reports that she does not drink alcohol and does not use drugs.  Allergies  Allergen Reactions   Clindamycin/Lincomycin Rash    Diffuse drug reaction eruption   Other Hives, Rash and Itching   Lamotrigine Rash    Family History  Problem Relation Age of Onset   Diabetes Mother    Breast cancer Mother 74   Thyroid disease Mother    Depression Mother    Hypertension Father    Heart disease Father    Heart attack Father 6       Two instances, first at age 43   Stroke Father    Alcohol abuse Father    Throat cancer Father    Diabetes Maternal Grandmother    Depression Maternal Grandmother    Uterine cancer Maternal Grandmother     Prior to Admission medications   Medication Sig Start Date End Date Taking? Authorizing Provider  acetaminophen (TYLENOL) 500 MG tablet Take 1,000 mg by mouth as needed. Takes 2-3 times weekly    [provider]  ALPRAZOLAM XR 1 MG 24 hr tablet Take 1 mg by mouth every morning. 05/08/21   [provider]  aluminum-magnesium hydroxide-simethicone (MAALOX) 762-831-51 MG/5ML SUSP Take 30 mLs by mouth 4 (four) times daily -  before meals and at bedtime. 09/06/21   Carrie Mew, MD  apixaban (ELIQUIS) 5 MG TABS tablet TAKE 1 TABLET BY MOUTH 2 TIMES DAILY. 03/01/22   Jettie Booze, MD  ARIPiprazole (ABILIFY) 15 MG tablet Take 15 mg by mouth at bedtime. 12/21/17   Noemi Chapel, NP  atorvastatin (LIPITOR) 40 MG tablet Take 1 tablet (40 mg total) by mouth daily. 04/07/21   Lorrene Reid, PA-C  clonazePAM (KLONOPIN) 1 MG tablet Take 1 mg by mouth daily as needed for anxiety. 07/09/19   [provider]  Dapagliflozin-metFORMIN HCl ER (XIGDUO XR) 11-998 MG TB24 Take 1 tablet by mouth daily. 10/15/21   Lorrene Reid, PA-C  diltiazem (CARDIZEM CD)  120 MG 24 hr capsule Take 1 capsule (120 mg total) by mouth daily. 11/03/20   Allred, Jeneen Rinks, MD  DULoxetine (CYMBALTA) 60 MG capsule Take 60 mg by mouth at bedtime. 11/04/16   [provider]  famotidine (PEPCID) 20 MG tablet Take 1 tablet (20 mg total) by mouth 2 (two) times daily. 09/06/21   Carrie Mew, MD  ferrous sulfate 325 (65 FE) MG EC tablet Take 1 tablet (325 mg total) by mouth 2 (two) times daily. 10/07/21   Ladell Pier, MD  insulin aspart (NOVOLOG FLEXPEN) 100 UNIT/ML FlexPen Inject 10 units into the skin before breakfast and lunch. Inject 13 units into the skin before supper. 12/24/21   Lorrene Reid, PA-C  losartan (COZAAR) 100 MG tablet Take 1 tablet (100 mg total) by mouth daily. Take 1 tablet daily by mouth. 04/07/21   Lorrene Reid, PA-C  omeprazole (PRILOSEC) 20 MG capsule Take 20  mg by mouth daily.    [provider]  ondansetron (ZOFRAN-ODT) 4 MG disintegrating tablet '4mg'$  ODT q4 hours prn nausea/vomit Patient taking differently: '4mg'$  ODT q4 hours prn nausea/vomit 09/20/21   Deno Etienne, DO  Semaglutide,0.25 or 0.'5MG'$ /DOS, (OZEMPIC, 0.25 OR 0.5 MG/DOSE,) 2 MG/1.5ML SOPN Inject 0.5 mg into the skin once a week. 10/15/21   Lorrene Reid, PA-C  tiZANidine (ZANAFLEX) 4 MG tablet Take 1 tablet by mouth at bedtime as needed. 05/14/21   [provider]  traMADol (ULTRAM) 50 MG tablet Take 1 tablet (50 mg total) by mouth every 6 (six) hours as needed. 05/09/21   Harvest Dark, MD    Physical Exam: Vitals:   03/02/22 1615 03/02/22 1700 03/02/22 1834 03/02/22 2000  BP: (!) 157/73 (!) 141/54 (!) 148/85 (!) 140/75  Pulse: 75 76 76 76  Resp: '17 19 18 '$ (!) 24  Temp:    98 F (36.7 C)  TempSrc:      SpO2: 98% 100% 92% 98%   Constitutional: NAD, calm, comfortable, nontoxic appearing middle-age female sitting upright in bed Eyes: lids and conjunctivae normal ENMT: Mucous membranes are moist.  Neck: normal, supple Respiratory: clear to auscultation  bilaterally, no wheezing, no crackles. Normal respiratory effort. No accessory muscle use.  Cardiovascular: Regular rate and rhythm, no murmurs / rubs / gallops. No extremity edema.   Abdomen: Soft, mildly distended, mild tenderness to left lower quadrant without any guarding, rebound tenderness or rigidity.  Bowel sounds positive.  Musculoskeletal: no clubbing / cyanosis. No joint deformity upper and lower extremities. Good ROM, no contractures. Normal muscle tone.  Skin: no rashes, lesions, ulcers. No induration Neurologic: CN 2-12 grossly intact.  Strength 5/5 in all 4.  Psychiatric: Normal judgment and insight. Alert and oriented x 3. Normal mood. Data Reviewed:  See HPI  Assessment and Plan: * Colitis -Rectal bleeding -CTA A/P showed focal short segment wall thickening with stranding along the proximal descending colon.  -recently resumed on Eliquis a few days ago. Will hold. Hgb stable at 13 -Had up-to-date colonoscopy in 2020 with polyps removed were negative for dysplasia. -clear liquid diet for bowel rest -IV fluids overnight -GI panel and C.diff panel is pending -[facility] has evaluated and no antibiotics recommended. Initially concerned for ischemic colitis but CTA with only focal area of inflammation and overall abdominal exam is benign.  Will continue conservative management. -PRN IV antiemetics -PRN IV morphine for pain  Hyperlipidemia associated with type 2 diabetes mellitus ([place]) Continue statin pending med rec  Type 2 diabetes mellitus with other specified complication ([place]) -insulin dependent -Last A1C in August of 7.7 -keep on sensitive SSI  Hypertension complicating diabetes ([place]) -normotensive. Continue home meds pending med rec.  Bipolar disorder ([place]) -normal mood. Continue home meds pending med rec.       Advance Care Planning:   Code Status: Full Code   Consults: [facility]   Family Communication: HUSBAND at bedside  Severity of Illness: The  appropriate patient status for this patient is OBSERVATION. Observation status is judged to be reasonable and necessary in order to provide the required intensity of service to ensure the patient's safety. The patient's presenting symptoms, physical exam findings, and initial radiographic and laboratory data in the context of their medical condition is felt to place them at decreased risk for further clinical deterioration. Furthermore, it is anticipated that the patient will be medically stable for discharge from the hospital within 2 midnights of admission.   Author: Royal Piedra T  Amariyah Bazar, DO 03/02/2022 8:21 PM  For on call review [email or web address].

## 2022-03-02 NOTE — Assessment & Plan Note (Signed)
-  insulin dependent -Last A1C in August of 7.7 -keep on sensitive SSI

## 2022-03-02 NOTE — Consult Note (Signed)
[facility] Consult: 3:59 PM 03/02/2022  LOS: 0 days    Referring Provider: Dr Billy Fischer  Primary Care Physician:  Lorrene Reid, PA-C Primary Gastroenterologist:  Dr. [name]     Reason for Consultation: Abdominal pain.  Diarrhea mixed with blood.   HPI: Morgan [name] is a 57 y.o. female.  PMH a fib.  On Eliquis.  Last dose 9 PM on 1/15.Marland Kitchen  Ovarian cancer.  Cervical dysplasia.  S/p laparoscopic hysterectomy, oophorectomy.  Benign breast mass.  Hypertension.  IDDM.  GERD.  Congenital left pelvic kidney.  Fatty liver, benign R hepatic lobe hypervascular mass seen on chest CT 12/2019.  Hepatic steatosis, hepatomegaly on MRI 2014.   04/2018 EGD.  By Dr. Vicente Males.   For abdominal pain.  She had gastric erythema, edema consistent with gastritis.  Pathology: Intestinal metaplasia of gastric mucosa 04/2018 colonoscopy.  Screening study.  2 polyps removed, size 3 to 7 mm. Path: Sessile serrated polyp.  Plan is for colonoscopy 04/2023. 10/2018 EGD to follow-up intestinal metaplasia.  Study of esophagus, stomach and duodenum grossly normal.  Stomach was biopsied and all samples were benign.  Patient had stopped taking Eliquis a couple of months ago because it was too expensive.  It was restarted on Thursday after cards staff was able to get drug assistance program involved.  On Thursday afternoon she had bradycardia with rate of 47 which resolved within a few minutes.  It was associated with chest pressure which resolved when the tachycardia resolved.  She did not seek medical attention for this.  On Friday patient woke up early with pain in her lower left abdomen, left side.  She proceeded to have a brown, nonbloody loose stool.  Pain subsided after about an hour. This morning she had the same early awakening with left  lower quadrant pain.  This time, stool was initially loose and brown but then became mixed with blood.  There was at most a moderate amount of blood.  She saw some pink blood on the tissue when she wiped.  No prior history of rectal bleeding.  Today's pain has not subsided and it is 10/10 at times.  Patient also vomited this morning, this was nonbloody, non-coffee-ground and consisted of remnants of last night's dinner.  Has not had recurrent bloody stool or emesis. In general patient has brown stools once or twice a day.  Appetite has been good.  She does not suffer from significant reflux or nausea.  Weight is stable. Patient denies history of anemia or blood transfusion but her med list includes bid po iron.   BMET w glucose 117, O/w normal.  LFTs, lipase normal. WBCs, platelets, Hgb normal. CT angio of abdomen is ordered, staff attempting to start IV line so she can undergo this test.  Family history in mother of breast cancer, thyroid disease, depression.  Father with hypertension, heart disease, heart attack in his 2s.  Stroke.  Alcohol abuse.  Throat cancer.  Maternal grandmother with diabetes, depression, uterine cancer.  Patient lives with her husband in [place].  On disability.  She does not drink alcohol or use illicit substances.  Never smoked tobacco.    Past Medical History:  Diagnosis Date   Anxiety    Atrial fibrillation ([place])    Bipolar disorder ([place])    Breast mass    right, removed and benign   Cancer ([place]) 09/16/2019   ovarian ca no radiation no chemo   Cervical dysplasia    Diabetes mellitus without complication (HCC)    Endometriosis    GERD (gastroesophageal reflux disease)    Hypercholesteremia    Hypertension    Migraine    Ovarian cancer ([place])    Pelvic kidney    left   Postpartum depression    hx of    Past Surgical History:  Procedure Laterality Date   ABDOMINAL HYSTERECTOMY N/A    Phreesia 07/30/2019   BACK SURGERY  02/16/2012    BREAST BIOPSY Right 2016   BREAST BIOPSY Right 2020   BREAST EXCISIONAL BIOPSY Right 2021   BREAST EXCISIONAL BIOPSY Right 2016   benign   BREAST LUMPECTOMY WITH RADIOACTIVE SEED LOCALIZATION Right 10/28/2014   Procedure: RIGHT BREAST LUMPECTOMY WITH RADIOACTIVE SEED LOCALIZATION;  Surgeon: [name], MD;  Location: [facility];  Service: General;  Laterality: Right;   BREAST LUMPECTOMY WITH RADIOACTIVE SEED LOCALIZATION Right 06/18/2019   Procedure: RIGHT BREAST RADIOACTIVE SEED LOCALIZATION LUMPECTOMY;  Surgeon: Jovita Kussmaul, MD;  Location: [facility];  Service: General;  Laterality: Right;   BREAST SURGERY N/A    Phreesia 07/30/2019   CESAREAN SECTION  02/15/2001   COLONOSCOPY WITH PROPOFOL N/A 05/01/2018   Procedure: COLONOSCOPY WITH PROPOFOL;  Surgeon: [name], MD;  Location[facility] ENDOSCOPY;  Service: Gastroenterology;  Laterality: N/A;   COLPOSCOPY     ESOPHAGOGASTRODUODENOSCOPY (EGD) WITH PROPOFOL N/A 05/01/2018   Procedure: ESOPHAGOGASTRODUODENOSCOPY (EGD) WITH PROPOFOL;  Surgeon: [name], MD;  Location[facility] ENDOSCOPY;  Service: Gastroenterology;  Laterality: N/A;   ESOPHAGOGASTRODUODENOSCOPY (EGD) WITH PROPOFOL N/A 11/13/2018   Procedure: ESOPHAGOGASTRODUODENOSCOPY (EGD) WITH PROPOFOL;  Surgeon: [name], MD;  Location: [facility] ENDOSCOPY;  Service: Gastroenterology;  Laterality: N/A;   GYNECOLOGIC CRYOSURGERY     NECK SURGERY  02/15/2010   PELVIC LAPAROSCOPY  02/15/1989    Prior to Admission medications   Medication Sig Start Date End Date Taking? Authorizing Provider  acetaminophen (TYLENOL) 500 MG tablet Take 1,000 mg by mouth as needed. Takes 2-3 times weekly    [provider]  ALPRAZOLAM XR 1 MG 24 hr tablet Take 1 mg by mouth every morning. 05/08/21   [provider]  aluminum-magnesium hydroxide-simethicone (MAALOX) 573-220-25 MG/5ML SUSP Take 30 mLs by mouth 4 (four) times daily -  before meals and at bedtime.  09/06/21   Carrie Mew, MD  apixaban (ELIQUIS) 5 MG TABS tablet TAKE 1 TABLET BY MOUTH 2 TIMES DAILY. 03/01/22   Jettie Booze, MD  ARIPiprazole (ABILIFY) 15 MG tablet Take 15 mg by mouth at bedtime. 12/21/17   Noemi Chapel, NP  atorvastatin (LIPITOR) 40 MG tablet Take 1 tablet (40 mg total) by mouth daily. 04/07/21   Lorrene Reid, PA-C  clonazePAM (KLONOPIN) 1 MG tablet Take 1 mg by mouth daily as needed for anxiety. 07/09/19   [provider]  Dapagliflozin-metFORMIN HCl ER (XIGDUO XR) 11-998 MG TB24 Take 1 tablet by mouth daily. 10/15/21   Lorrene Reid, PA-C  diltiazem (CARDIZEM CD) 120 MG 24 hr capsule Take 1 capsule (120 mg total) by mouth daily. 11/03/20  Thompson Grayer, MD  DULoxetine (CYMBALTA) 60 MG capsule Take 60 mg by mouth at bedtime. 11/04/16   [provider]  famotidine (PEPCID) 20 MG tablet Take 1 tablet (20 mg total) by mouth 2 (two) times daily. 09/06/21   Carrie Mew, MD  ferrous sulfate 325 (65 FE) MG EC tablet Take 1 tablet (325 mg total) by mouth 2 (two) times daily. 10/07/21   Ladell Pier, MD  insulin aspart (NOVOLOG FLEXPEN) 100 UNIT/ML FlexPen Inject 10 units into the skin before breakfast and lunch. Inject 13 units into the skin before supper. 12/24/21   Lorrene Reid, PA-C  losartan (COZAAR) 100 MG tablet Take 1 tablet (100 mg total) by mouth daily. Take 1 tablet daily by mouth. 04/07/21   Lorrene Reid, PA-C  omeprazole (PRILOSEC) 20 MG capsule Take 20 mg by mouth daily.    [provider]  ondansetron (ZOFRAN-ODT) 4 MG disintegrating tablet '4mg'$  ODT q4 hours prn nausea/vomit Patient taking differently: '4mg'$  ODT q4 hours prn nausea/vomit 09/20/21   Deno Etienne, DO  Semaglutide,0.25 or 0.'5MG'$ /DOS, (OZEMPIC, 0.25 OR 0.5 MG/DOSE,) 2 MG/1.5ML SOPN Inject 0.5 mg into the skin once a week. 10/15/21   Lorrene Reid, PA-C  tiZANidine (ZANAFLEX) 4 MG tablet Take 1 tablet by mouth at bedtime as needed. 05/14/21   [provider]   traMADol (ULTRAM) 50 MG tablet Take 1 tablet (50 mg total) by mouth every 6 (six) hours as needed. 05/09/21   Harvest Dark, MD    Scheduled Meds:  Infusions:  PRN Meds:    Allergies as of 03/02/2022 - Review Complete 03/02/2022  Allergen Reaction Noted   Clindamycin/lincomycin Rash 01/24/2019   Other Hives, Rash, and Itching 07/30/2019   Lamotrigine Rash 08/29/2014    Family History  Problem Relation Age of Onset   Diabetes Mother    Breast cancer Mother 39   Thyroid disease Mother    Depression Mother    Hypertension Father    Heart disease Father    Heart attack Father 10       Two instances, first at age 7   Stroke Father    Alcohol abuse Father    Throat cancer Father    Diabetes Maternal Grandmother    Depression Maternal Grandmother    Uterine cancer Maternal Grandmother     Social History   Socioeconomic History   Marital status: Married    Spouse name: Not on file   Number of children: Not on file   Years of education: Not on file   Highest education level: Not on file  Occupational History   Not on file  Tobacco Use   Smoking status: Never   Smokeless tobacco: Never   Tobacco comments:    Never smoke 07/01/21  Vaping Use   Vaping Use: Never used  Substance and Sexual Activity   Alcohol use: No   Drug use: No   Sexual activity: Yes    Partners: Male    Birth control/protection: None    Comment: vasectomy  Other Topics Concern   Not on file  Social History Narrative   Lives in [place] with spouse and son.   Disabled.   Social Determinants of Health   Financial Resource Strain: Not on file  Food Insecurity: Not on file  Transportation Needs: Not on file  Physical Activity: Not on file  Stress: Not on file  Social Connections: Not on file  Intimate Partner Violence: Not on file    REVIEW OF SYSTEMS: Constitutional:  Generally no weakness or fatigue. ENT:  No nose bleeds Pulm: No shortness of breath or cough. CV:  No  palpitations, no LE edema.  Limited run of bradycardia associated with chest pressure last Thursday, it resolved within an hour. GU:  No hematuria, no frequency GI: See HPI. Heme: Denies unusual bleeding or bruising. Transfusions: No previous transfusions.  Denies history of excessive bleeding or bruising. Neuro:  No headaches, no peripheral tingling or numbness.  No syncope, no seizures. Derm:  No itching, no rash or sores.  Endocrine:  No sweats or chills.  No polyuria or dysuria Immunization: Reviewed. Travel: Not queried.Marland Kitchen    PHYSICAL EXAM: Vital signs in last 24 hours: Vitals:   03/02/22 1339 03/02/22 1515  BP:  139/64  Pulse:  65  Resp:  16  Temp: 98.2 F (36.8 C) 97.7 F (36.5 C)  SpO2:  100%   Wt Readings from Last 3 Encounters:  01/06/22 88.5 kg  12/30/21 88.1 kg  10/15/21 86.6 kg    General: Obese, alert, looks uncomfortable but not acutely ill. Head: No facial asymmetry or swelling.  No signs of head trauma. Eyes: No scleral icterus or conjunctival pallor.  EOMI. Ears: No hearing deficit Nose: No congestion or discharge. Mouth: Good dentition.  Tongue midline.  Mucosa moist, pink, clear. Neck: No JVD, no masses, no thyromegaly Lungs: Clear lungs with good breath sounds bilaterally.  No labored breathing or cough Heart: RRR.  No MRG.  S1, S2 present. Abdomen: Soft.  Mild to moderate tenderness without guarding or rebound in the left abdomen and moving into the mid abdomen.  Bowel sounds active.  No protuberance or distention.  No HSM, masses, bruits, hernias..   Rectal: No visible or palpable masses.  No tenderness.  No fissures.  Exam glove with small amount of red blood Musc/Skeltl:.  No joint redness, swelling or gross deformity. Extremities: No CCE. Neurologic: Oriented x 3.  Good historian.  Moves all 4 limbs.  No tremors or gross weakness. Skin: Macular erythema across the upper chest into the neck area. Nodes: No cervical adenopathy Psych: Cooperative,  pleasant, a bit anxious.  Intake/Output from previous day: No intake/output data recorded. Intake/Output this shift: No intake/output data recorded.  LAB RESULTS: Recent Labs    03/02/22 1111  WBC 9.4  HGB 13.2  HCT 41.6  PLT 217   BMET Lab Results  Component Value Date   NA 140 03/02/2022   NA 139 01/06/2022   NA 137 09/20/2021   K 3.5 03/02/2022   K 3.3 (L) 01/06/2022   K 3.7 09/20/2021   CL 104 03/02/2022   CL 103 01/06/2022   CL 105 09/20/2021   CO2 25 03/02/2022   CO2 22 01/06/2022   CO2 24 09/20/2021   GLUCOSE 117 (H) 03/02/2022   GLUCOSE 178 (H) 01/06/2022   GLUCOSE 215 (H) 09/20/2021   BUN 10 03/02/2022   BUN 10 01/06/2022   BUN 6 09/20/2021   CREATININE 0.73 03/02/2022   CREATININE 0.72 01/06/2022   CREATININE 0.70 09/20/2021   CALCIUM 9.4 03/02/2022   CALCIUM 9.8 01/06/2022   CALCIUM 9.3 09/20/2021   LFT Recent Labs    03/02/22 1111  PROT 6.4*  ALBUMIN 3.5  AST 28  ALT 25  ALKPHOS 95  BILITOT 0.5   PT/INR Lab Results  Component Value Date   INR 0.9 01/06/2022   INR 1.0 09/05/2021   INR 0.95 12/01/2012   Hepatitis Panel No results for input(s): "HEPBSAG", "HCVAB", "HEPAIGM", "HEPBIGM"  in the last 72 hours. C-Diff No components found for: "CDIFF" Lipase     Component Value Date/Time   LIPASE 25 03/02/2022 1111    Drugs of Abuse  No results found for: "LABOPIA", "COCAINSCRNUR", "LABBENZ", "AMPHETMU", "THCU", "LABBARB"   RADIOLOGY STUDIES: No results found.    IMPRESSION:   Bloody stool, abdominal pain, limted non bloody emesis.  Rule out ischemic colitis.  CT angio ordered and will be completed once IV access obtained.  Atrial fibrillation.  Recently restarted on Eliquis as of 02/25/2022.  Sessile serrated colon polyps on colonoscopy 04/2018.  Repeat surveillance due 04/2023.    Intestinal metaplasia of gastric mucosa on EGD 04/2018.  Repeat EGD 10/2018 with gastric biopsies all benign.  Home meds listed are omeprazole 20 mg  daily.  IDDM.    PLAN:       Clear liquids ok after she undergoes the CT scan.  Will defer decision re abx to Dr Havery Moros.  Prn pain and nausea meds.     Azucena Freed  03/02/2022, 3:59 PM Phone [phone]

## 2022-03-02 NOTE — Assessment & Plan Note (Signed)
-  normotensive. Continue home meds pending med rec.

## 2022-03-02 NOTE — Assessment & Plan Note (Signed)
>>  ASSESSMENT AND PLAN FOR COLITIS WRITTEN ON 03/02/2022  8:21 PM BY TU, CHING T, DO  -Rectal bleeding -CTA A/P showed focal short segment wall thickening with stranding along the proximal descending colon.  -recently resumed on Eliquis a few days ago. Will hold. Hgb stable at 13 -Had up-to-date colonoscopy in 2020 with polyps removed were negative for dysplasia. -clear liquid diet for bowel rest -IV fluids overnight -GI panel and C.diff panel is pending - GI has evaluated and no antibiotics recommended. Initially concerned for ischemic colitis but CTA with only focal area of inflammation and overall abdominal exam is benign.  Will continue conservative management. -PRN IV antiemetics -PRN IV morphine for pain

## 2022-03-02 NOTE — Assessment & Plan Note (Signed)
-  normal mood. Continue home meds pending med rec.

## 2022-03-02 NOTE — ED Provider Notes (Signed)
[facility] EMERGENCY DEPARTMENT Provider Note   CSN: [id number] Arrival date & time: 03/02/22  6389     History  Chief Complaint  Patient presents with   Abdominal Pain   Emesis   Nausea   Diarrhea   Rectal Bleeding    Morgan [name] is a 57 y.o. female.  HPI     57 year old female with a history of hypertension, atrial flutter and fibrillation on Eliquis, diabetes, ovarian cancer, who presents with concern for abdominal pain, nausea, vomiting, diarrhea and rectal bleeding.   Reports having diarrhea twice with bright red blood in the toilet.  While in the ED also reports she felt like she was going to have diarrhea but instead it was just blood. Like a bloody movement. FIrst diarrhea had blood in it, was mixed in. No black tarry stool. No hx of GI bleed.  Had abdominal pain preceding this, bilateral lower abdomen with severe cramping. Had nausea and one episode of vomiting.  Abdominal pain waxes and wanes. No fever or urinary symptoms.  No lightheadedness now. Did have an episode of abdominal pain days ago which was transient and associated with lightheadedness.   Saw [facility] 4 years ago, believes she is due for anotehr colonoscopy this year. Had polyps, no other concerns she can remember. Last dose of eliquis was last night.      Past Medical History:  Diagnosis Date   Anxiety    Atrial fibrillation ([place])    Bipolar disorder ([place])    Breast mass    right, removed and benign   Cancer ([place]) 09/16/2019   ovarian ca no radiation no chemo   Cervical dysplasia    Diabetes mellitus without complication (HCC)    Endometriosis    GERD (gastroesophageal reflux disease)    Hypercholesteremia    Hypertension    Migraine    Ovarian cancer ([place])    Pelvic kidney    left   Postpartum depression    hx of     Home Medications Prior to Admission medications   Medication Sig Start Date End Date Taking? Authorizing Provider  acetaminophen (TYLENOL)  500 MG tablet Take 1,000 mg by mouth daily as needed for moderate pain, mild pain, headache or fever.   Yes [provider]  ALPRAZOLAM XR 1 MG 24 hr tablet Take 1 mg by mouth daily as needed for anxiety. 05/08/21  Yes [provider]  apixaban (ELIQUIS) 5 MG TABS tablet TAKE 1 TABLET BY MOUTH 2 TIMES DAILY. 03/01/22  Yes Jettie Booze, MD  ARIPiprazole (ABILIFY) 15 MG tablet Take 15 mg by mouth at bedtime. 12/21/17  Yes Noemi Chapel, NP  Dapagliflozin-metFORMIN HCl ER (XIGDUO XR) 11-998 MG TB24 Take 1 tablet by mouth daily. Patient taking differently: Take 1 tablet by mouth at bedtime. 10/15/21  Yes Abonza, Maritza, PA-C  DULoxetine (CYMBALTA) 60 MG capsule Take 60 mg by mouth at bedtime. 11/04/16  Yes [provider]  fluticasone (FLONASE) 50 MCG/ACT nasal spray Place 1 spray into both nostrils daily as needed for allergies.   Yes [provider]  insulin aspart (NOVOLOG FLEXPEN) 100 UNIT/ML FlexPen Inject 10 units into the skin before breakfast and lunch. Inject 13 units into the skin before supper. Patient taking differently: Inject 10-13 Units into the skin See admin instructions. Inject 10 units before breakfast and lunch, inject 13 units before supper. 12/24/21  Yes Abonza, Maritza, PA-C  lidocaine (LIDODERM) 5 % Place 1 patch onto the skin daily  as needed (pain).   Yes [provider]  losartan (COZAAR) 100 MG tablet Take 1 tablet (100 mg total) by mouth daily. Take 1 tablet daily by mouth. Patient taking differently: Take 100 mg by mouth at bedtime. 04/07/21  Yes Abonza, Maritza, PA-C  omeprazole (PRILOSEC OTC) 20 MG tablet Take 20 mg by mouth at bedtime.   Yes [provider]  ondansetron (ZOFRAN-ODT) 4 MG disintegrating tablet '4mg'$  ODT q4 hours prn nausea/vomit Patient taking differently: Take 4 mg by mouth daily as needed for nausea or vomiting. 09/20/21  Yes Deno Etienne, DO  tiZANidine (ZANAFLEX) 4 MG tablet Take 1 tablet by mouth at  bedtime as needed for muscle spasms. 05/14/21  Yes [provider]  atorvastatin (LIPITOR) 40 MG tablet Take 1 tablet (40 mg total) by mouth daily. Patient not taking: Reported on 03/03/2022 04/07/21   Lorrene Reid, PA-C  diltiazem (CARDIZEM CD) 120 MG 24 hr capsule Take 1 capsule (120 mg total) by mouth daily. Patient not taking: Reported on 03/03/2022 11/03/20   Thompson Grayer, MD  famotidine (PEPCID) 20 MG tablet Take 1 tablet (20 mg total) by mouth 2 (two) times daily. Patient not taking: Reported on 03/03/2022 09/06/21   Carrie Mew, MD  ferrous sulfate 325 (65 FE) MG EC tablet Take 1 tablet (325 mg total) by mouth 2 (two) times daily. Patient not taking: Reported on 03/03/2022 10/07/21   Ladell Pier, MD  Semaglutide,0.25 or 0.'5MG'$ /DOS, (OZEMPIC, 0.25 OR 0.5 MG/DOSE,) 2 MG/1.5ML SOPN Inject 0.5 mg into the skin once a week. Patient not taking: Reported on 03/03/2022 10/15/21   Lorrene Reid, PA-C      Allergies    Clindamycin/lincomycin, Lamictal [lamotrigine], and Lipitor [atorvastatin]    Review of Systems   Review of Systems  Physical Exam Updated Vital Signs BP 129/63 (BP Location: Right Arm)   Pulse 83   Temp 98.4 F (36.9 C) (Oral)   Resp 17   LMP 05/12/2011   SpO2 94%  Physical Exam Vitals and nursing note reviewed.  Constitutional:      General: She is not in acute distress.    Appearance: She is well-developed. She is not diaphoretic.  HENT:     Head: Normocephalic and atraumatic.  Eyes:     Conjunctiva/sclera: Conjunctivae normal.  Cardiovascular:     Rate and Rhythm: Normal rate and regular rhythm.     Heart sounds: Normal heart sounds. No murmur heard.    No friction rub. No gallop.  Pulmonary:     Effort: Pulmonary effort is normal. No respiratory distress.     Breath sounds: Normal breath sounds. No wheezing or rales.  Abdominal:     General: There is no distension.     Palpations: Abdomen is soft.     Tenderness: There is abdominal  tenderness in the left lower quadrant. There is no guarding.  Musculoskeletal:        General: No tenderness.     Cervical back: Normal range of motion.  Skin:    General: Skin is warm and dry.     Findings: No erythema or rash.  Neurological:     Mental Status: She is alert and oriented to person, place, and time.     ED Results / Procedures / Treatments   Labs (all labs ordered are listed, but only abnormal results are displayed) Labs Reviewed  COMPREHENSIVE METABOLIC PANEL - Abnormal; Notable for the following components:      Result Value   Glucose, Bld  117 (*)    Total Protein 6.4 (*)    All other components within normal limits  URINALYSIS, ROUTINE W REFLEX MICROSCOPIC - Abnormal; Notable for the following components:   Specific Gravity, Urine >1.046 (*)    Glucose, UA >=500 (*)    Hgb urine dipstick SMALL (*)    Bacteria, UA RARE (*)    All other components within normal limits  CBC - Abnormal; Notable for the following components:   Hemoglobin 11.9 (*)    All other components within normal limits  GLUCOSE, CAPILLARY - Abnormal; Notable for the following components:   Glucose-Capillary 134 (*)    All other components within normal limits  GLUCOSE, CAPILLARY - Abnormal; Notable for the following components:   Glucose-Capillary 166 (*)    All other components within normal limits  GLUCOSE, CAPILLARY - Abnormal; Notable for the following components:   Glucose-Capillary 150 (*)    All other components within normal limits  I-STAT BETA HCG BLOOD, ED ([facility], [facility], [facility] ONLY) - Abnormal; Notable for the following components:   I-stat hCG, quantitative 5.8 (*)    All other components within normal limits  C DIFFICILE QUICK SCREEN W PCR REFLEX    GASTROINTESTINAL PANEL BY PCR, STOOL (REPLACES STOOL CULTURE)  LIPASE, BLOOD  CBC  PROTIME-INR  TYPE AND SCREEN    EKG None  Radiology CT Angio Abd/Pel W and/or Wo Contrast  Result Date: 03/02/2022 CLINICAL DATA:  Mesenteric  ischemia. Acute abdominal pain. Active GI bleed with 3 episodes of rectal bleeding EXAM: CTA ABDOMEN AND PELVIS WITHOUT AND WITH CONTRAST TECHNIQUE: Multidetector CT imaging of the abdomen and pelvis was performed using the standard protocol during bolus administration of intravenous contrast. Multiplanar reconstructed images and MIPs were obtained and reviewed to evaluate the vascular anatomy. RADIATION DOSE REDUCTION: This exam was performed according to the departmental dose-optimization program which includes automated exposure control, adjustment of the mA and/or kV according to patient size and/or use of iterative reconstruction technique. CONTRAST:  52m OMNIPAQUE IOHEXOL 350 MG/ML SOLN COMPARISON:  Noncontrast CT 09/20/2021 and older. Contrast study from 2021 November. Numerous other examinations. FINDINGS: VASCULAR Aorta: Minimal atherosclerotic changes along the abdominal aorta. No dissection or aneurysm formation. Celiac: There is a variant with for vessels forming from the celiac axis including left gastric, splenic artery and separate right and left hepatic arteries. No significant stenosis. SMA: Grossly patent without significant stenosis. Renals: 2 right renal arteries. Dominant in standard origin location. Accessory to lower pole originating from the extreme distal aorta above the aortic bifurcation. 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 Arterial supply originates from the extreme distal aspect of the abdominal aorta above the bifurcation. Second vessel originates from the anterior aspect of the proximal right common iliac artery. IMA: Grossly patent Inflow: Patent without evidence of aneurysm, dissection, vasculitis or significant stenosis. Proximal Outflow: Bilateral common femoral and visualized portions of the superficial and profunda femoral arteries are patent without evidence of aneurysm, dissection, vasculitis or significant stenosis. 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 Review of the MIP images confirms the above findings. NON-VASCULAR Lower chest: Mild linear opacity at the bases likely scar or atelectasis. No pleural effusion. Hepatobiliary: Diffuse fatty liver infiltration. There is an enhancing lesion peripherally in segment 6 which has progressive peripheral nodular enhancement consistent with a hemangioma measuring 17 by 14 mm on series 8 image 51. Gallbladder is nondilated. Gallbladder appears folded. Patent portal vein.  There is a small cystic focus identified in segment 6 of the liver as well on series 5, image 36 measuring 8 mm with Hounsfield units of 10. Pancreas: Mild fatty atrophy of the pancreas. No obvious enhancing mass. Spleen: The spleen is nonenlarged. Adrenals/Urinary Tract: The adrenal glands are preserved. 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 This anomalous kidney has some punctate nonobstructing stones. No collecting system dilatation of either kidney. Preserved enhancement of the right kidney. Normal-appearing location of insertion of the left ureter. Stomach/Bowel: On this non oral contrast exam, the stomach is nondilated. Large bowel has a normal course and caliber with scattered stool. No obstruction or dilatation. Normal appendix. There is however focal wall thickening of the proximal descending colon with stranding consistent with a focal colitis. Lymphatic: No developing abnormal lymph node enlargement seen in the abdomen and pelvis. Reproductive: Prostate is unremarkable. Other: No abdominal wall hernia or abnormality. No abdominopelvic ascites. Musculoskeletal: Fixation hardware noted at the spine at the L4-5 level. Slight curvature of the spine. Multilevel degenerative changes. There is a sclerotic area involving the vertebral body superior right at at L2. This has been stable since at least August 2020.  Possible bone island. IMPRESSION: VASCULAR No significant stenosis, vessel occlusion or aneurysm. No areas of active extravasation. Anomalous celiac axis. NON-VASCULAR Focal short segment wall thickening with stranding along the proximal descending colon. Please correlate for an area of acute colitis. Recommend follow-up to confirm resolution. No obstruction, free air. Fatty liver infiltration with a small cyst and separate hemangioma. 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 Electronically Signed   By: Jill Side M.D.   On: 03/02/2022 17:38    Procedures Procedures    Medications Ordered in ED Medications  0.9 %  sodium chloride infusion ( Intravenous New Bag/Given 03/02/22 2101)  morphine (PF) 2 MG/ML injection 2 mg (2 mg Intravenous Given 03/03/22 0855)  ondansetron (ZOFRAN) injection 4 mg (has no administration in time range)  insulin aspart (novoLOG) injection 0-9 Units (2 Units Subcutaneous Given 03/03/22 0850)  apixaban (ELIQUIS) tablet 5 mg (5 mg Oral Given 03/03/22 1027)  ondansetron (ZOFRAN) injection 4 mg (4 mg Intravenous Given 03/02/22 1726)  dicyclomine (BENTYL) injection 20 mg (20 mg Intramuscular Given 03/02/22 1742)  iohexol (OMNIPAQUE) 350 MG/ML injection 75 mL (75 mLs Intravenous Contrast Given 03/02/22 1710)    ED Course/ Medical Decision Making/ A&P                              57 year old female with a history of hypertension, atrial flutter and fibrillation on Eliquis, diabetes, ovarian cancer, who presents with concern for abdominal pain, nausea, vomiting, diarrhea and rectal bleeding.   DDx includes diverticulitis, diverticular bleed, other lower GI bleed, colitis, gastroenteritis with bleeding hemorrhoids.  Labs completed and personally evaluated by me. Hcg ordered in triage is detectable, noted to be similar in past, has hx of hyesterectomy and likely lab abnormality. Lipase WNL, no sign of pancreatiits,  no sign of hepatitis.  INR WNL. Hgb WNL and vital signs stable.   CTA shows no sign of active extravisation, no vessel occlusion or stenosis, does show small area of colitis.  Possible infectious.  Consulted GI given amount of bleeding described, anticoagulation who saw and evaluated patient.  Recommend supportive care for now, consider ischemic colitis. Will keep on clears and they  will reevaluate in AM. Admitted for continued monitoring in setting of rectal bleeding on anticoagulation and colitis.         Final Clinical Impression(s) / ED Diagnoses Final diagnoses:  Gastrointestinal hemorrhage, unspecified gastrointestinal hemorrhage type  Colitis    Rx / DC Orders ED Discharge Orders     None         Gareth Morgan, MD 03/03/22 1217

## 2022-03-02 NOTE — Assessment & Plan Note (Addendum)
-  Rectal bleeding -CTA A/P showed focal short segment wall thickening with stranding along the proximal descending colon.  -recently resumed on Eliquis a few days ago. Will hold. Hgb stable at 13 -Had up-to-date colonoscopy in 2020 with polyps removed were negative for dysplasia. -clear liquid diet for bowel rest -IV fluids overnight -GI panel and C.diff panel is pending -[facility] has evaluated and no antibiotics recommended. Initially concerned for ischemic colitis but CTA with only focal area of inflammation and overall abdominal exam is benign.  Will continue conservative management. -PRN IV antiemetics -PRN IV morphine for pain

## 2022-03-02 NOTE — ED Notes (Signed)
ED TO INPATIENT HANDOFF REPORT  ED Nurse Name and Phone #:   [phone] Morgan [name]  S Name/Age/Gender Morgan [name] 57 y.o. female Room/Bed: TRACC/TRACC  Code Status   Code Status: Full Code  Home/SNF/Other Home Patient oriented to: self, place, time, and situation Is this baseline? Yes   Triage Complete: Triage complete  Chief Complaint Colitis [K52.9]  Triage Note Pt. Stated, At 0500 I woke up with stomach pain, N/V/D. I had 2 episodes of each N/D . The 2 times I had diarrhea I had bright red blood in the toilet. I take Elaquis for A-Fib.    Allergies Allergies  Allergen Reactions   Clindamycin/Lincomycin Rash    Diffuse drug reaction eruption   Other Hives, Rash and Itching   Lamotrigine Rash    Level of Care/Admitting Diagnosis ED Disposition     ED Disposition  Admit   Condition  --   [place]: [place] [[id number]]  Level of Care: Med-Surg [16]  May place patient in observation at [facility] or [place] if equivalent level of care is available:: No  Covid Evaluation: Asymptomatic - no recent exposure (last 10 days) testing not required  Diagnosis: Colitis [[id number]]  Admitting Physician: Orene Desanctis [[id number]]  Attending Physician: Orene Desanctis [[id number]]          B Medical/Surgery History Past Medical History:  Diagnosis Date   Anxiety    Atrial fibrillation ([place])    Bipolar disorder ([place])    Breast mass    right, removed and benign   Cancer ([place]) 09/16/2019   ovarian ca no radiation no chemo   Cervical dysplasia    Diabetes mellitus without complication (HCC)    Endometriosis    GERD (gastroesophageal reflux disease)    Hypercholesteremia    Hypertension    Migraine    Ovarian cancer ([place])    Pelvic kidney    left   Postpartum depression    hx of   Past Surgical History:  Procedure Laterality Date   ABDOMINAL HYSTERECTOMY N/A    Phreesia 07/30/2019   BACK SURGERY  02/16/2012   BREAST  BIOPSY Right 2016   BREAST BIOPSY Right 2020   BREAST EXCISIONAL BIOPSY Right 2021   BREAST EXCISIONAL BIOPSY Right 2016   benign   BREAST LUMPECTOMY WITH RADIOACTIVE SEED LOCALIZATION Right 10/28/2014   Procedure: RIGHT BREAST LUMPECTOMY WITH RADIOACTIVE SEED LOCALIZATION;  Surgeon: [name], MD;  Location: [place];  Service: General;  Laterality: Right;   BREAST LUMPECTOMY WITH RADIOACTIVE SEED LOCALIZATION Right 06/18/2019   Procedure: RIGHT BREAST RADIOACTIVE SEED LOCALIZATION LUMPECTOMY;  Surgeon: Jovita Kussmaul, MD;  Location: [place];  Service: General;  Laterality: Right;   BREAST SURGERY N/A    Phreesia 07/30/2019   CESAREAN SECTION  02/15/2001   COLONOSCOPY WITH PROPOFOL N/A 05/01/2018   Procedure: COLONOSCOPY WITH PROPOFOL;  Surgeon: [name], MD;  Location: [facility] ENDOSCOPY;  Service: Gastroenterology;  Laterality: N/A;   COLPOSCOPY     ESOPHAGOGASTRODUODENOSCOPY (EGD) WITH PROPOFOL N/A 05/01/2018   Procedure: ESOPHAGOGASTRODUODENOSCOPY (EGD) WITH PROPOFOL;  Surgeon: [name], MD;  Location: [facility] END[facility]COPY;  Service: Gastroenterology;  Laterality: N/A;   ESOPHAGOGASTRODUODENOSCOPY (EGD) WITH PROPOFOL N/A 11/13/2018   Procedure: ESOPHAGOGASTRODUODENOSCOPY (EGD) WITH PROPOFOL;  Surgeon: [name], MD;  Location: [facility] ENDOSCOPY;  Service: Gastroenterology;  Laterality: N/A;   GYNECOLOGIC CRYOSURGERY     NECK SURGERY  02/15/2010   PELVIC LAPAROSCOPY  02/15/1989  A IV Location/Drains/Wounds Patient Lines/Drains/Airways Status     Active Line/Drains/Airways     Name Placement date Placement time Site Days   Peripheral IV 03/02/22 20 G Anterior;Distal;Left;Upper Arm 03/02/22  1632  Arm  less than 1            Intake/Output Last 24 hours No intake or output data in the 24 hours ending 03/02/22 2019  Labs/Imaging Results for orders placed or performed during the hospital encounter of 03/02/22 (from the past 48 hour(s))   Lipase, blood     Status: None   Collection Time: 03/02/22 11:11 AM  Result Value Ref Range   Lipase 25 11 - 51 U/L    Comment: Performed at [facility], [place] [address] [place], [place] [address]  Comprehensive metabolic panel     Status: Abnormal   Collection Time: 03/02/22 11:11 AM  Result Value Ref Range   Sodium 140 135 - 145 mmol/L   Potassium 3.5 3.5 - 5.1 mmol/L   Chloride 104 98 - 111 mmol/L   CO2 25 22 - 32 mmol/L   Glucose, Bld 117 (H) 70 - 99 mg/dL    Comment: Glucose reference range applies only to samples taken after fasting for at least 8 hours.   BUN 10 6 - 20 mg/dL   Creatinine, Ser 0.73 0.44 - 1.00 mg/dL   Calcium 9.4 8.9 - 10.3 mg/dL   Total Protein 6.4 (L) 6.5 - 8.1 g/dL   Albumin 3.5 3.5 - 5.0 g/dL   AST 28 15 - 41 U/L   ALT 25 0 - 44 U/L   Alkaline Phosphatase 95 38 - 126 U/L   Total Bilirubin 0.5 0.3 - 1.2 mg/dL   GFR, Estimated >60 >60 mL/min    Comment: (NOTE) Calculated using the CKD-EPI Creatinine Equation (2021)    Anion gap 11 5 - 15    Comment: Performed at [facility] [address], [place], [place] [address]  CBC     Status: None   Collection Time: 03/02/22 11:11 AM  Result Value Ref Range   WBC 9.4 4.0 - 10.5 K/uL   RBC 4.94 3.87 - 5.11 MIL/uL   Hemoglobin 13.2 12.0 - 15.0 g/dL   HCT 41.6 36.0 - 46.0 %   MCV 84.2 80.0 - 100.0 fL   MCH 26.7 26.0 - 34.0 pg   MCHC 31.7 30.0 - 36.0 g/dL   RDW 14.1 11.5 - 15.5 %   Platelets 217 150 - 400 K/uL   nRBC 0.0 0.0 - 0.2 %    Comment: Performed at [facility], [place] [address] [place], [place] [address]  I-Stat beta hCG blood, ED     Status: Abnormal   Collection Time: 03/02/22 11:21 AM  Result Value Ref Range   I-stat hCG, quantitative 5.8 (H) <5 mIU/mL   Comment 3            Comment:   GEST. AGE      CONC.  (mIU/mL)   <=1 WEEK        5 - 50     2 WEEKS       50 - 500     3 WEEKS       100 - 10,000     4 WEEKS     1,000 - 30,000        FEMALE AND NON-PREGNANT FEMALE:      LESS THAN 5 mIU/mL   Protime-INR  Status: None   Collection Time: 03/02/22  3:49 PM  Result Value Ref Range   Prothrombin Time 13.5 11.4 - 15.2 seconds   INR 1.0 0.8 - 1.2    Comment: (NOTE) INR goal varies based on device and disease states. Performed at [facility], [place] [address] [place], [place] [address]   Type and screen [place]     Status: None   Collection Time: 03/02/22  4:35 PM  Result Value Ref Range   ABO/RH(D) A POS    Antibody Screen NEG    Sample Expiration      03/05/2022,2359 Performed at [facility], [place] [address], [place], [place] [address]   Urinalysis, Routine w reflex microscopic     Status: Abnormal   Collection Time: 03/02/22  5:47 PM  Result Value Ref Range   Color, Urine YELLOW YELLOW   APPearance CLEAR CLEAR   Specific Gravity, Urine >1.046 (H) 1.005 - 1.030   pH 5.0 5.0 - 8.0   Glucose, UA >=500 (A) NEGATIVE mg/dL   Hgb urine dipstick SMALL (A) NEGATIVE   Bilirubin Urine NEGATIVE NEGATIVE   Ketones, ur NEGATIVE NEGATIVE mg/dL   Protein, ur NEGATIVE NEGATIVE mg/dL   Nitrite NEGATIVE NEGATIVE   Leukocytes,Ua NEGATIVE NEGATIVE   RBC / HPF 6-10 0 - 5 RBC/hpf   WBC, UA 0-5 0 - 5 WBC/hpf   Bacteria, UA RARE (A) NONE SEEN   Squamous Epithelial / HPF 0-5 0 - 5 /HPF   Mucus PRESENT    Budding Yeast PRESENT     Comment: Performed at [facility], [address], [place], [place] [address]   CT Angio Abd/Pel W and/or Wo Contrast  Result Date: 03/02/2022 CLINICAL DATA:  Mesenteric ischemia. Acute abdominal pain. Active GI bleed with 3 episodes of rectal bleeding EXAM: CTA ABDOMEN AND PELVIS WITHOUT AND WITH CONTRAST TECHNIQUE: Multidetector CT imaging of the abdomen and pelvis was performed using the standard protocol during bolus administration of intravenous contrast. Multiplanar reconstructed images and MIPs were obtained and reviewed to evaluate the vascular anatomy. RADIATION DOSE REDUCTION: This exam was  performed according to the departmental dose-optimization program which includes automated exposure control, adjustment of the mA and/or kV according to patient size and/or use of iterative reconstruction technique. CONTRAST:  60m OMNIPAQUE IOHEXOL 350 MG/ML SOLN COMPARISON:  Noncontrast CT 09/20/2021 and older. Contrast study from 2021 November. Numerous other examinations. FINDINGS: VASCULAR Aorta: Minimal atherosclerotic changes along the abdominal aorta. No dissection or aneurysm formation. Celiac: There is a variant with for vessels forming from the celiac axis including left gastric, splenic artery and separate right and left hepatic arteries. No significant stenosis. SMA: Grossly patent without significant stenosis. Renals: 2 right renal arteries. Dominant in standard origin location. Accessory to lower pole originating from the extreme distal aorta above the aortic bifurcation. 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 Arterial supply originates from the extreme distal aspect of the abdominal aorta above the bifurcation. Second vessel originates from the anterior aspect of the proximal right common iliac artery. IMA: Grossly patent Inflow: Patent without evidence of aneurysm, dissection, vasculitis or significant stenosis. Proximal Outflow: Bilateral common femoral and visualized portions of the superficial and profunda femoral arteries are patent without evidence of aneurysm, dissection, vasculitis or significant stenosis. 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 Review of the MIP images confirms the above findings. NON-VASCULAR Lower chest: Mild linear  opacity at the bases likely scar or atelectasis. No pleural effusion. Hepatobiliary: Diffuse fatty liver infiltration. There is an enhancing lesion peripherally in segment 6 which has progressive peripheral nodular enhancement consistent with a hemangioma measuring 17 by 14 mm on  series 8 image 51. Gallbladder is nondilated. Gallbladder appears folded. Patent portal vein. There is a small cystic focus identified in segment 6 of the liver as well on series 5, image 36 measuring 8 mm with Hounsfield units of 10. Pancreas: Mild fatty atrophy of the pancreas. No obvious enhancing mass. Spleen: The spleen is nonenlarged. Adrenals/Urinary Tract: The adrenal glands are preserved. 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 This anomalous kidney has some punctate nonobstructing stones. No collecting system dilatation of either kidney. Preserved enhancement of the right kidney. Normal-appearing location of insertion of the left ureter. Stomach/Bowel: On this non oral contrast exam, the stomach is nondilated. Large bowel has a normal course and caliber with scattered stool. No obstruction or dilatation. Normal appendix. There is however focal wall thickening of the proximal descending colon with stranding consistent with a focal colitis. Lymphatic: No developing abnormal lymph node enlargement seen in the abdomen and pelvis. Reproductive: Prostate is unremarkable. Other: No abdominal wall hernia or abnormality. No abdominopelvic ascites. Musculoskeletal: Fixation hardware noted at the spine at the L4-5 level. Slight curvature of the spine. Multilevel degenerative changes. There is a sclerotic area involving the vertebral body superior right at at L2. This has been stable since at least August 2020. Possible bone island. IMPRESSION: VASCULAR No significant stenosis, vessel occlusion or aneurysm. No areas of active extravasation. Anomalous celiac axis. NON-VASCULAR Focal short segment wall thickening with stranding along the proximal descending colon. Please correlate for an area of acute colitis. Recommend follow-up to confirm resolution. No obstruction, free air. Fatty liver infiltration with a small cyst and separate hemangioma.  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 Electronically Signed   By: Jill Side M.D.   On: 03/02/2022 17:38    Pending Labs Unresulted Labs (From admission, onward)     Start     Ordered   03/03/22 0500  CBC  Tomorrow morning,   R        03/02/22 2004   03/02/22 1819  Gastrointestinal Panel by PCR , Stool  (Gastrointestinal Panel by PCR, Stool                                                                                                                                                     **Does Not include CLOSTRIDIUM DIFFICILE testing. **If CDIFF testing is needed, place order from the "C Difficile Testing" order set.**)  Once,   URGENT        03/02/22 1818   03/02/22 1818  C Difficile Quick Screen w PCR reflex  (C Difficile quick screen w PCR reflex panel )  Once, for 24 hours,   URGENT       References:    CDiff Information Tool   03/02/22 1818            Vitals/Pain Today's Vitals   03/02/22 1700 03/02/22 1743 03/02/22 1834 03/02/22 2000  BP: (!) 141/54  (!) 148/85 (!) 140/75  Pulse: 76  76 76  Resp: 19  18 (!) 24  Temp:    98 F (36.7 C)  TempSrc:      SpO2: 100%  92% 98%  PainSc:  7       Isolation Precautions Enteric precautions (UV disinfection)  Medications Medications  0.9 %  sodium chloride infusion (has no administration in time range)  morphine (PF) 2 MG/ML injection 2 mg (has no administration in time range)  ondansetron (ZOFRAN) injection 4 mg (has no administration in time range)  insulin aspart (novoLOG) injection 0-9 Units (has no administration in time range)  ondansetron (ZOFRAN) injection 4 mg (4 mg Intravenous Given 03/02/22 1726)  dicyclomine (BENTYL) injection 20 mg (20 mg Intramuscular Given 03/02/22 1742)  iohexol (OMNIPAQUE) 350 MG/ML injection 75 mL (75 mLs Intravenous Contrast Given 03/02/22 1710)    Mobility walks     Focused Assessments    R Recommendations: See  Admitting Provider Note  Report given to:   Additional Notes:  Pt alert and oriented x4, ambulates without assistance

## 2022-03-02 NOTE — Assessment & Plan Note (Signed)
>>  ASSESSMENT AND PLAN FOR HYPERTENSION COMPLICATING DIABETES ([place]) WRITTEN ON 03/02/2022  8:16 PM BY TU, CHING T, DO  -normotensive. Continue home meds pending med rec.

## 2022-03-03 DIAGNOSIS — Z811 Family history of alcohol abuse and dependence: Secondary | ICD-10-CM | POA: Diagnosis not present

## 2022-03-03 DIAGNOSIS — Z8543 Personal history of malignant neoplasm of ovary: Secondary | ICD-10-CM | POA: Diagnosis not present

## 2022-03-03 DIAGNOSIS — Z86010 Personal history of colonic polyps: Secondary | ICD-10-CM | POA: Diagnosis not present

## 2022-03-03 DIAGNOSIS — Z8349 Family history of other endocrine, nutritional and metabolic diseases: Secondary | ICD-10-CM | POA: Diagnosis not present

## 2022-03-03 DIAGNOSIS — K625 Hemorrhage of anus and rectum: Secondary | ICD-10-CM | POA: Diagnosis present

## 2022-03-03 DIAGNOSIS — Z8049 Family history of malignant neoplasm of other genital organs: Secondary | ICD-10-CM | POA: Diagnosis not present

## 2022-03-03 DIAGNOSIS — I480 Paroxysmal atrial fibrillation: Secondary | ICD-10-CM | POA: Diagnosis present

## 2022-03-03 DIAGNOSIS — F319 Bipolar disorder, unspecified: Secondary | ICD-10-CM | POA: Diagnosis present

## 2022-03-03 DIAGNOSIS — K529 Noninfective gastroenteritis and colitis, unspecified: Secondary | ICD-10-CM | POA: Diagnosis present

## 2022-03-03 DIAGNOSIS — K760 Fatty (change of) liver, not elsewhere classified: Secondary | ICD-10-CM

## 2022-03-03 DIAGNOSIS — Z803 Family history of malignant neoplasm of breast: Secondary | ICD-10-CM | POA: Diagnosis not present

## 2022-03-03 DIAGNOSIS — K559 Vascular disorder of intestine, unspecified: Secondary | ICD-10-CM

## 2022-03-03 DIAGNOSIS — Z818 Family history of other mental and behavioral disorders: Secondary | ICD-10-CM | POA: Diagnosis not present

## 2022-03-03 DIAGNOSIS — F419 Anxiety disorder, unspecified: Secondary | ICD-10-CM | POA: Diagnosis present

## 2022-03-03 DIAGNOSIS — Z808 Family history of malignant neoplasm of other organs or systems: Secondary | ICD-10-CM | POA: Diagnosis not present

## 2022-03-03 DIAGNOSIS — F1010 Alcohol abuse, uncomplicated: Secondary | ICD-10-CM | POA: Diagnosis present

## 2022-03-03 DIAGNOSIS — Z833 Family history of diabetes mellitus: Secondary | ICD-10-CM | POA: Diagnosis not present

## 2022-03-03 DIAGNOSIS — E7800 Pure hypercholesterolemia, unspecified: Secondary | ICD-10-CM | POA: Diagnosis present

## 2022-03-03 DIAGNOSIS — K219 Gastro-esophageal reflux disease without esophagitis: Secondary | ICD-10-CM | POA: Diagnosis present

## 2022-03-03 DIAGNOSIS — G43909 Migraine, unspecified, not intractable, without status migrainosus: Secondary | ICD-10-CM | POA: Diagnosis present

## 2022-03-03 DIAGNOSIS — E1169 Type 2 diabetes mellitus with other specified complication: Secondary | ICD-10-CM | POA: Diagnosis present

## 2022-03-03 DIAGNOSIS — I10 Essential (primary) hypertension: Secondary | ICD-10-CM | POA: Diagnosis present

## 2022-03-03 DIAGNOSIS — Z823 Family history of stroke: Secondary | ICD-10-CM | POA: Diagnosis not present

## 2022-03-03 DIAGNOSIS — Z8249 Family history of ischemic heart disease and other diseases of the circulatory system: Secondary | ICD-10-CM | POA: Diagnosis not present

## 2022-03-03 LAB — CBC
HCT: 36.4 % (ref 36.0–46.0)
Hemoglobin: 11.9 g/dL — ABNORMAL LOW (ref 12.0–15.0)
MCH: 26.7 pg (ref 26.0–34.0)
MCHC: 32.7 g/dL (ref 30.0–36.0)
MCV: 81.8 fL (ref 80.0–100.0)
Platelets: 204 10*3/uL (ref 150–400)
RBC: 4.45 MIL/uL (ref 3.87–5.11)
RDW: 14.3 % (ref 11.5–15.5)
WBC: 7.3 10*3/uL (ref 4.0–10.5)
nRBC: 0 % (ref 0.0–0.2)

## 2022-03-03 LAB — GLUCOSE, CAPILLARY
Glucose-Capillary: 125 mg/dL — ABNORMAL HIGH (ref 70–99)
Glucose-Capillary: 134 mg/dL — ABNORMAL HIGH (ref 70–99)
Glucose-Capillary: 150 mg/dL — ABNORMAL HIGH (ref 70–99)
Glucose-Capillary: 166 mg/dL — ABNORMAL HIGH (ref 70–99)
Glucose-Capillary: 211 mg/dL — ABNORMAL HIGH (ref 70–99)
Glucose-Capillary: 94 mg/dL (ref 70–99)

## 2022-03-03 MED ORDER — APIXABAN 5 MG PO TABS
5.0000 mg | ORAL_TABLET | Freq: Two times a day (BID) | ORAL | Status: DC
  Administered 2022-03-03 – 2022-03-04 (×3): 5 mg via ORAL
  Filled 2022-03-03 (×3): qty 1

## 2022-03-03 MED ORDER — ONDANSETRON 4 MG PO TBDP: 4.0000 mg | ORAL_TABLET | Freq: Four times a day (QID) | ORAL | Status: DC | PRN

## 2022-03-03 MED ORDER — ALPRAZOLAM 0.5 MG PO TABS: 1.0000 mg | ORAL_TABLET | Freq: Every day | ORAL | Status: DC | PRN

## 2022-03-03 MED ORDER — ARIPIPRAZOLE 10 MG PO TABS
15.0000 mg | ORAL_TABLET | Freq: Every day | ORAL | Status: DC
  Administered 2022-03-03: 15 mg via ORAL
  Filled 2022-03-03: qty 2

## 2022-03-03 MED ORDER — FLUTICASONE PROPIONATE 50 MCG/ACT NA SUSP: 1.0000 | Freq: Every day | NASAL | Status: DC | PRN

## 2022-03-03 MED ORDER — PANTOPRAZOLE SODIUM 20 MG PO TBEC
20.0000 mg | DELAYED_RELEASE_TABLET | Freq: Every day | ORAL | Status: DC
  Administered 2022-03-03: 20 mg via ORAL
  Filled 2022-03-03: qty 1

## 2022-03-03 MED ORDER — DULOXETINE HCL 60 MG PO CPEP
60.0000 mg | ORAL_CAPSULE | Freq: Every day | ORAL | Status: DC
  Administered 2022-03-03: 60 mg via ORAL
  Filled 2022-03-03: qty 1

## 2022-03-03 NOTE — Progress Notes (Signed)
PROGRESS NOTE  Taisa R Bouvier  DOB: 01/11/1966  PCP: Lorrene Reid, PA-C KXF:[id number]  DOA: 03/02/2022  LOS: 0 days  Hospital Day: 2  Brief narrative: DONNABELLE BLANCHARD is a 57 y.o. female with PMH significant for DM2, HTN, HLD, paroxysmal A-fib on Eliquis, ovarian cancer s/p resection, endometriosis, anxiety, bipolar disorder 1/16, patient woke up in the morning with LLQ abdominal pain, nausea, vomiting and bloody diarrhea and hence came to the ED In the ED, she had 2 more episodes of rectal bleeding without any stool. Of note, she was off Eliquis for several months because of financial constraints and restarted it 4 days ago.  In the ED, she was afebrile, hemodynamically stable, breathing on room air. Hemoglobin 13.2. CT angio of abdomen pelvis showed focal short segment wall thickening with stranding along the proximal descending colon.  She was seen by [facility].  Findings suggestive of ischemic colitis. Admitted to [facility]  Subjective: Patient was seen and examined this morning.  Pleasant [name].  Lying down in bed.  Not in distress.  GI follow-up appreciated. Remains hemodynamically stable Hemoglobin this morning down to 11.9  Assessment and plan: Suspected ischemic colitis Presented with bright red blood per rectum.  On Eliquis No prior history of GI bleeding.  CTA A/P showed focal short segment wall thickening with stranding along the proximal descending colon.  GI following.  Eliquis was initially held. Per my conversation with GI Dr. Havery Moros this morning, will resume Eliquis.  Will avoid hypotension Currently on soft diet. Continue IV antiemetics, IV morphine as needed  Type 2 diabetes mellitus A1c 7.7 on 10/15/2021 PTA on dapagliflozin 10 mg daily, metformin 1000 mg daily, NovoLog Premeal 3 times daily Currently on sliding scale with Accu-Cheks Recent Labs  Lab 03/03/22 0024 03/03/22 0808 03/03/22 1151  GLUCAP 134* 166* 150*    Hypertension PTA on losartan 100 mg daily,. Currently on hold.  Will avoid hypotension  Hyperlipidemia Not taking Lipitor  Fatty liver Noted on CT scan.  Recommend lifestyle management.     Bipolar disorder Anxiety disorder normal mood. 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 Anomalous vascular supply.  Renal function normal and stable   Mobility: Encourage ambulation  Goals of care   Code Status: Full Code    DVT prophylaxis:  SCDs Start: 03/02/22 2004 apixaban (ELIQUIS) tablet 5 mg   Antimicrobials: None currently Fluid: None currently Consultants: GI Family Communication: None at bedside  Scheduled Meds:  apixaban  5 mg Oral BID   ARIPiprazole  15 mg Oral QHS   DULoxetine  60 mg Oral QHS   insulin aspart  0-9 Units Subcutaneous TID WC   omeprazole  20 mg Oral QHS    PRN meds: ALPRAZolam, fluticasone, morphine injection, ondansetron (ZOFRAN) IV, ondansetron   Infusions:    Antimicrobials: Anti-infectives (From admission, onward)    None       Skin assessment:      Diet:  Diet Order             DIET SOFT Room service appropriate? Yes; Fluid consistency: Thin  Diet effective now                   Nutritional status:  There is no height or weight on file to calculate BMI.  Status is: Inpatient Level of care: Med-Surg  Continue inhospital care because: Needs inpatient monitoring GI bleeding   Dispo: The patient is from: Home              Anticipated d/c is to: Pending clinical course              Patient currently is not medically stable to d/c.   Difficult to place patient No   Objective: Vitals:   03/03/22 0502 03/03/22 0811  BP: 128/67 129/63  Pulse: 69 83  Resp:  17  Temp: 98 F (36.7 C) 98.4 F (36.9 C)  SpO2: 96% 94%    Intake/Output Summary (Last 24 hours)  at 03/03/2022 1344 Last data filed at 03/03/2022 1152 Gross per 24 hour  Intake 600 ml  Output --  Net 600 ml   There were no vitals filed for this visit. Weight change:  There is no height or weight on file to calculate BMI.   Physical Exam: General exam: Pleasant, [name].  Not in distress Skin: No rashes, lesions or ulcers. HEENT: Atraumatic, normocephalic, no obvious bleeding Lungs: Clear to auscultation bilaterally CVS: Regular rate and rhythm, no murmur GI/Abd soft, mild lower abdominal tenderness, nondistended, bowel sound present CNS: Alert, awake, oriented x 3 Psychiatry: Mood appropriate Extremities: No pedal edema, no calf tenderness  Data Review: I have personally reviewed the laboratory data and studies available.  F/u labs ordered Unresulted Labs (From admission, onward)     Start     Ordered   03/02/22 1819  Gastrointestinal Panel by PCR , Stool  (Gastrointestinal Panel by PCR, Stool                                                                                                                                                     **Does Not include CLOSTRIDIUM DIFFICILE testing. **If CDIFF testing is needed, place order from the "C Difficile Testing" order set.**)  Once,   URGENT        03/02/22 1818   03/02/22 1818  C Difficile Quick Screen w PCR reflex  (C Difficile quick screen w PCR reflex panel )  Once, for 24 hours,   URGENT       References:    CDiff Information Tool   03/02/22 1818            Total time spent in review of labs and imaging, patient evaluation, formulation of plan, documentation and communication with family:  8 minutes  Signed, Terrilee Croak, MD [facility] 03/03/2022

## 2022-03-03 NOTE — Progress Notes (Signed)
Stool was not collected because Pt did not have a bowel movement. Day nurse will be informed.

## 2022-03-03 NOTE — Care Management (Signed)
  Transition of Care (TOC) Screening Note   Patient Details  Name: Morgan [name] Date of Birth: 10/23/1965   Transition of Care [facility]) CM/SW Contact:    Carles Collet, RN Phone Number: 03/03/2022, 4:24 PM    Transition of Care Department [facility]) has reviewed patient and no TOC needs have been identified at this time. We will continue to monitor patient advancement through interdisciplinary progression rounds. If new patient transition needs arise, please place a TOC consult.

## 2022-03-03 NOTE — Progress Notes (Signed)
Progress Note   Subjective  CT scan done overnight - colitis noted. She reports no further bleeding, no vomiting, no bowel movements, but pain remains in the LLQ.    Objective   Vital signs in last 24 hours: Temp:  [97.7 F (36.5 C)-98.6 F (37 C)] 98.4 F (36.9 C) (01/17 0811) Pulse Rate:  [59-83] 83 (01/17 0811) Resp:  [16-24] 17 (01/17 0811) BP: (124-157)/(54-85) 129/63 (01/17 0811) SpO2:  [92 %-100 %] 94 % (01/17 0811)   General:    white female in NAD Abdomen:  Soft, some LLQ tenderness to palpation,  and nondistended.  Neurologic:  Alert and oriented,  grossly normal neurologically. Psych:  Cooperative. Normal mood and affect.  Intake/Output from previous day: No intake/output data recorded. Intake/Output this shift: Total I/O In: 240 [P.O.:240] Out: -   Lab Results: Recent Labs    03/02/22 1111 03/03/22 0319  WBC 9.4 7.3  HGB 13.2 11.9*  HCT 41.6 36.4  PLT 217 204   BMET Recent Labs    03/02/22 1111  NA 140  K 3.5  CL 104  CO2 25  GLUCOSE 117*  BUN 10  CREATININE 0.73  CALCIUM 9.4   LFT Recent Labs    03/02/22 1111  PROT 6.4*  ALBUMIN 3.5  AST 28  ALT 25  ALKPHOS 95  BILITOT 0.5   PT/INR Recent Labs    03/02/22 1549  LABPROT 13.5  INR 1.0    Studies/Results: CT Angio Abd/Pel W and/or Wo Contrast  Result Date: 03/02/2022 CLINICAL DATA:  Mesenteric ischemia. Acute abdominal pain. Active GI bleed with 3 episodes of rectal bleeding EXAM: CTA ABDOMEN AND PELVIS WITHOUT AND WITH CONTRAST TECHNIQUE: Multidetector CT imaging of the abdomen and pelvis was performed using the standard protocol during bolus administration of intravenous contrast. Multiplanar reconstructed images and MIPs were obtained and reviewed to evaluate the vascular anatomy. RADIATION DOSE REDUCTION: This exam was performed according to the departmental dose-optimization program which includes automated exposure control, adjustment of the mA and/or kV according to  patient size and/or use of iterative reconstruction technique. CONTRAST:  29m OMNIPAQUE IOHEXOL 350 MG/ML SOLN COMPARISON:  Noncontrast CT 09/20/2021 and older. Contrast study from 2021 November. Numerous other examinations. FINDINGS: VASCULAR Aorta: Minimal atherosclerotic changes along the abdominal aorta. No dissection or aneurysm formation. Celiac: There is a variant with for vessels forming from the celiac axis including left gastric, splenic artery and separate right and left hepatic arteries. No significant stenosis. SMA: Grossly patent without significant stenosis. Renals: 2 right renal arteries. Dominant in standard origin location. Accessory to lower pole originating from the extreme distal aorta above the aortic bifurcation. 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 Arterial supply originates from the extreme distal aspect of the abdominal aorta above the bifurcation. Second vessel originates from the anterior aspect of the proximal right common iliac artery. IMA: Grossly patent Inflow: Patent without evidence of aneurysm, dissection, vasculitis or significant stenosis. Proximal Outflow: Bilateral common femoral and visualized portions of the superficial and profunda femoral arteries are patent without evidence of aneurysm, dissection, vasculitis or significant stenosis. 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 Review of the MIP images confirms the above findings. NON-VASCULAR Lower chest: Mild linear opacity at the bases likely scar or atelectasis. No pleural effusion. Hepatobiliary: Diffuse fatty liver infiltration. There is an enhancing lesion peripherally in segment 6 which has progressive peripheral nodular enhancement consistent with a  hemangioma measuring 17 by 14 mm on series 8 image 51. Gallbladder is nondilated. Gallbladder appears folded. Patent portal vein. There is a small cystic focus identified in segment 6 of  the liver as well on series 5, image 36 measuring 8 mm with Hounsfield units of 10. Pancreas: Mild fatty atrophy of the pancreas. No obvious enhancing mass. Spleen: The spleen is nonenlarged. Adrenals/Urinary Tract: The adrenal glands are preserved. 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 This anomalous kidney has some punctate nonobstructing stones. No collecting system dilatation of either kidney. Preserved enhancement of the right kidney. Normal-appearing location of insertion of the left ureter. Stomach/Bowel: On this non oral contrast exam, the stomach is nondilated. Large bowel has a normal course and caliber with scattered stool. No obstruction or dilatation. Normal appendix. There is however focal wall thickening of the proximal descending colon with stranding consistent with a focal colitis. Lymphatic: No developing abnormal lymph node enlargement seen in the abdomen and pelvis. Reproductive: Prostate is unremarkable. Other: No abdominal wall hernia or abnormality. No abdominopelvic ascites. Musculoskeletal: Fixation hardware noted at the spine at the L4-5 level. Slight curvature of the spine. Multilevel degenerative changes. There is a sclerotic area involving the vertebral body superior right at at L2. This has been stable since at least August 2020. Possible bone island. IMPRESSION: VASCULAR No significant stenosis, vessel occlusion or aneurysm. No areas of active extravasation. Anomalous celiac axis. NON-VASCULAR Focal short segment wall thickening with stranding along the proximal descending colon. Please correlate for an area of acute colitis. Recommend follow-up to confirm resolution. No obstruction, free air. Fatty liver infiltration with a small cyst and separate hemangioma. 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 Electronically Signed   By: Jill Side M.D.    On: 03/02/2022 17:38       Assessment / Plan:    57 y/o female here with the following:  Suspected ischemic colitis LLQ pain Rectal bleeding Fatty liver  CT scan done overnight showing focal left-sided colitis.  Reviewed differential for this with her.  Given her presentation of symptoms this is almost certainly represents ischemic colitis.  We discussed why she may have had this.  No recent antibiotic use or constipation.  No new meds other than Eliquis which would not be thought to cause this.  She thinks she had some hypotension in recent days when her heart rate was low.  Perhaps that precipitated this.  She has not had any further bleeding.  If she passes a stool we can send off stool studies to rule out infection although that would seem to be less likely at this point.  She wants to eat, can advance her diet to soft today.  She is still having some pain and would monitor for now.  Hopefully this resolves with some time.  Her colonoscopy is up-to-date, I don't think we need to repeat that unless she fails to improve.  She has no leukocytosis.  I think okay to hold off on antibiotics for now, but can add them if she worsens. I think okay to resume her eliquis at this time.   Incidentally noted to have fatty liver on CT scan which we discussed.  She does not drink any alcohol.  Her LFTs are normal.  She needs to focus on weight loss as outpatient and needs outpatient follow-up for that issue.  She understands and agrees with the plan.  PLAN: - soft diet as tolerated - avoid hypotension - GI pathogen panel if she has loose stool - okay to resume Eliquis - monitor for worsening pain / recurrent bleeding. If she worsens would add antibiotics but I think okay to hold on that for now, she seems to be improving - work weight loss for fatty liver as outpatient  Hopefully home in next 1-2 days if she continues to improve, will follow.  Jolly Mango, MD [facility]

## 2022-03-04 DIAGNOSIS — K625 Hemorrhage of anus and rectum: Secondary | ICD-10-CM | POA: Diagnosis not present

## 2022-03-04 DIAGNOSIS — K529 Noninfective gastroenteritis and colitis, unspecified: Secondary | ICD-10-CM | POA: Diagnosis not present

## 2022-03-04 DIAGNOSIS — K559 Vascular disorder of intestine, unspecified: Secondary | ICD-10-CM | POA: Diagnosis not present

## 2022-03-04 DIAGNOSIS — K760 Fatty (change of) liver, not elsewhere classified: Secondary | ICD-10-CM | POA: Diagnosis not present

## 2022-03-04 LAB — CBC
HCT: 36.2 % (ref 36.0–46.0)
Hemoglobin: 11.5 g/dL — ABNORMAL LOW (ref 12.0–15.0)
MCH: 26.2 pg (ref 26.0–34.0)
MCHC: 31.8 g/dL (ref 30.0–36.0)
MCV: 82.5 fL (ref 80.0–100.0)
Platelets: 189 10*3/uL (ref 150–400)
RBC: 4.39 MIL/uL (ref 3.87–5.11)
RDW: 13.8 % (ref 11.5–15.5)
WBC: 5.6 10*3/uL (ref 4.0–10.5)
nRBC: 0 % (ref 0.0–0.2)

## 2022-03-04 LAB — GLUCOSE, CAPILLARY
Glucose-Capillary: 129 mg/dL — ABNORMAL HIGH (ref 70–99)
Glucose-Capillary: 98 mg/dL (ref 70–99)

## 2022-03-04 NOTE — Discharge Summary (Signed)
Physician Discharge Summary  Morgan [name] QTM:[id number] DOB: [birth year] DOA: 03/02/2022  PCP: Lorrene Reid, PA-C  Admit date: 03/02/2022 Discharge date: 03/04/2022  Admitted From: Home Discharge disposition: Home  Recommendations at discharge:  Recommended to stay on soft/bland diet for the next several days Follow-up with GI as an outpatient.   Brief narrative: Morgan [name] is a 57 y.o. female with PMH significant for DM2, HTN, HLD, paroxysmal A-fib on Eliquis, ovarian cancer s/p resection, endometriosis, anxiety, bipolar disorder 1/16, patient woke up in the morning with LLQ abdominal pain, nausea, vomiting and bloody diarrhea and hence came to the ED In the ED, she had 2 more episodes of rectal bleeding without any stool. Of note, she was off Eliquis for several months because of financial constraints and restarted it 4 days ago.  In the ED, she was afebrile, hemodynamically stable, breathing on room air. Hemoglobin 13.2. CT angio of abdomen pelvis showed focal short segment wall thickening with stranding along the proximal descending colon.  She was seen by [facility].  Findings suggestive of ischemic colitis. Admitted to [facility]  Subjective: Patient was seen and examined this morning.   Sitting up in bed.  Not in distress.  Abdominal pain improving.  No more having bloody diarrhea.  Hospital course: Suspected ischemic colitis Presented with bright red blood per rectum.  On Eliquis No prior history of GI bleeding.  CTA A/P showed focal short segment wall thickening with stranding along the proximal descending colon.  GI following.  Eliquis was initially held. 1/17, per recommendation by GI Dr. Havery Moros, Eliquis was resumed. Currently on soft diet.  Continue to see pulm next few days.  Type 2 diabetes mellitus A1c 7.7 on 10/15/2021 PTA on dapagliflozin 10 mg daily, metformin 1000 mg daily, NovoLog Premeal 3 times daily Continue the same post discharge. Recent  Labs  Lab 03/03/22 1735 03/03/22 2007 03/03/22 2227 03/04/22 0809 03/04/22 1212  GLUCAP 94 211* 125* 129* 98   Hypertension PTA on losartan 100 mg daily,. Currently on hold.  Will avoid hypotension  Hyperlipidemia Not taking Lipitor  Fatty liver Noted on CT scan. Recommend lifestyle management.     Bipolar disorder Anxiety disorder normal mood. PTA on Xanax 1 mg daily as needed, Abilify 15 mg at bedtime, Cymbalta 60 mg at bedtime Continue the same.  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 Anomalous vascular supply.  Renal function normal and stable   Mobility: Encourage ambulation  Goals of care   Code Status: Full Code      DVT prophylaxis:  SCDs Start: 03/02/22 2004 apixaban (ELIQUIS) tablet 5 mg   Antimicrobials: None currently Fluid: None currently Consultants: GI Family Communication: None at bedside  Scheduled Meds:  apixaban  5 mg Oral BID   ARIPiprazole  15 mg Oral QHS   DULoxetine  60 mg Oral QHS   insulin aspart  0-9 Units Subcutaneous TID WC   pantoprazole  20 mg Oral QHS    PRN meds: ALPRAZolam, fluticasone, morphine injection, ondansetron (ZOFRAN) IV, ondansetron   Infusions:    Antimicrobials: Anti-infectives (From admission, onward)    None       Skin assessment:      Diet:  Diet Order             Diet general           DIET SOFT Room service appropriate? Yes; Fluid consistency: Thin  Diet effective now  Nutritional status:  There is no height or weight on file to calculate BMI.           Status is: Inpatient Level of care: Med-Surg  Continue inhospital care because: Needs inpatient monitoring GI bleeding   Dispo: The patient is from: Home              Anticipated d/c is to: Pending clinical course              Patient currently is not medically stable to d/c.   Difficult to place patient No   Objective: Vitals:   03/04/22  0355 03/04/22 0812  BP: 115/61 125/66  Pulse: 75 70  Resp: 18 18  Temp: 98.1 F (36.7 C) 98.8 F (37.1 C)  SpO2: 91% 93%    Intake/Output Summary (Last 24 hours) at 03/04/2022 1332 Last data filed at 03/04/2022 1213 Gross per 24 hour  Intake 540 ml  Output --  Net 540 ml   There were no vitals filed for this visit. Weight change:  There is no height or weight on file to calculate BMI.   Physical Exam: General exam: Pleasant, [name].  Not in distress Skin: No rashes, lesions or ulcers. HEENT: Atraumatic, normocephalic, no obvious bleeding Lungs: Clear to auscultation bilaterally CVS: Regular rate and rhythm, no murmur GI/Abd soft, mild lower abdominal tenderness, nondistended, bowel sound present CNS: Alert, awake, oriented x 3 Psychiatry: Mood appropriate Extremities: No pedal edema, no calf tenderness  Data Review: I have personally reviewed the laboratory data and studies available.  F/u labs ordered Unresulted Labs (From admission, onward)     Start     Ordered   03/05/22 0500  CBC with Differential/Platelet  Daily,   R     Question:  Specimen collection method  Answer:  Lab=Lab collect   03/04/22 0822   03/02/22 1819  Gastrointestinal Panel by PCR , Stool  (Gastrointestinal Panel by PCR, Stool                                                                                                                                                     **Does Not include CLOSTRIDIUM DIFFICILE testing. **If CDIFF testing is needed, place order from the "C Difficile Testing" order set.**)  Once,   URGENT        03/02/22 1818   03/02/22 1818  C Difficile Quick Screen w PCR reflex  (C Difficile quick screen w PCR reflex panel )  Once, for 24 hours,   URGENT       References:    CDiff Information Tool   03/02/22 1818            Total time spent in review of labs and imaging, patient evaluation, formulation of plan, documentation and communication with family:   39 minutes  Signed, Marlowe Aschoff  Arend Bahl, MD [facility] 03/04/2022

## 2022-03-04 NOTE — Progress Notes (Signed)
Progress Note   Subjective  Patient states she is feeling much better today.  Abdominal pain has significantly lessened.  She is tolerating a soft diet.  Labs are stable.   Objective   Vital signs in last 24 hours: Temp:  [98.1 F (36.7 C)-98.8 F (37.1 C)] 98.8 F (37.1 C) (01/18 0812) Pulse Rate:  [70-81] 70 (01/18 0812) Resp:  [17-18] 18 (01/18 0812) BP: (115-136)/(61-67) 125/66 (01/18 0812) SpO2:  [91 %-98 %] 93 % (01/18 0812)   General:    white female in NAD Neurologic:  Alert and oriented,  grossly normal neurologically. Psych:  Cooperative. Normal mood and affect.  Intake/Output from previous day: 01/17 0701 - 01/18 0700 In: 820 [P.O.:820] Out: -  Intake/Output this shift: Total I/O In: 320 [P.O.:320] Out: -   Lab Results: Recent Labs    03/02/22 1111 03/03/22 0319 03/04/22 0838  WBC 9.4 7.3 5.6  HGB 13.2 11.9* 11.5*  HCT 41.6 36.4 36.2  PLT 217 204 189   BMET Recent Labs    03/02/22 1111  NA 140  K 3.5  CL 104  CO2 25  GLUCOSE 117*  BUN 10  CREATININE 0.73  CALCIUM 9.4   LFT Recent Labs    03/02/22 1111  PROT 6.4*  ALBUMIN 3.5  AST 28  ALT 25  ALKPHOS 95  BILITOT 0.5   PT/INR Recent Labs    03/02/22 1549  LABPROT 13.5  INR 1.0    Studies/Results: CT Angio Abd/Pel W and/or Wo Contrast  Result Date: 03/02/2022 CLINICAL DATA:  Mesenteric ischemia. Acute abdominal pain. Active GI bleed with 3 episodes of rectal bleeding EXAM: CTA ABDOMEN AND PELVIS WITHOUT AND WITH CONTRAST TECHNIQUE: Multidetector CT imaging of the abdomen and pelvis was performed using the standard protocol during bolus administration of intravenous contrast. Multiplanar reconstructed images and MIPs were obtained and reviewed to evaluate the vascular anatomy. RADIATION DOSE REDUCTION: This exam was performed according to the departmental dose-optimization program which includes automated exposure control, adjustment of the mA and/or kV according to patient  size and/or use of iterative reconstruction technique. CONTRAST:  40m OMNIPAQUE IOHEXOL 350 MG/ML SOLN COMPARISON:  Noncontrast CT 09/20/2021 and older. Contrast study from 2021 November. Numerous other examinations. FINDINGS: VASCULAR Aorta: Minimal atherosclerotic changes along the abdominal aorta. No dissection or aneurysm formation. Celiac: There is a variant with for vessels forming from the celiac axis including left gastric, splenic artery and separate right and left hepatic arteries. No significant stenosis. SMA: Grossly patent without significant stenosis. Renals: 2 right renal arteries. Dominant in standard origin location. Accessory to lower pole originating from the extreme distal aorta above the aortic bifurcation. 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 Arterial supply originates from the extreme distal aspect of the abdominal aorta above the bifurcation. Second vessel originates from the anterior aspect of the proximal right common iliac artery. IMA: Grossly patent Inflow: Patent without evidence of aneurysm, dissection, vasculitis or significant stenosis. Proximal Outflow: Bilateral common femoral and visualized portions of the superficial and profunda femoral arteries are patent without evidence of aneurysm, dissection, vasculitis or significant stenosis. 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 Review of the MIP images confirms the above findings. NON-VASCULAR Lower chest: Mild linear opacity at the bases likely scar or atelectasis. No pleural effusion. Hepatobiliary: Diffuse fatty liver infiltration. There is an enhancing lesion peripherally in segment 6 which has progressive peripheral nodular enhancement consistent  with a hemangioma measuring 17 by 14 mm on series 8 image 51. Gallbladder is nondilated. Gallbladder appears folded. Patent portal vein. There is a small cystic focus identified in segment 6 of the liver  as well on series 5, image 36 measuring 8 mm with Hounsfield units of 10. Pancreas: Mild fatty atrophy of the pancreas. No obvious enhancing mass. Spleen: The spleen is nonenlarged. Adrenals/Urinary Tract: The adrenal glands are preserved. 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 This anomalous kidney has some punctate nonobstructing stones. No collecting system dilatation of either kidney. Preserved enhancement of the right kidney. Normal-appearing location of insertion of the left ureter. Stomach/Bowel: On this non oral contrast exam, the stomach is nondilated. Large bowel has a normal course and caliber with scattered stool. No obstruction or dilatation. Normal appendix. There is however focal wall thickening of the proximal descending colon with stranding consistent with a focal colitis. Lymphatic: No developing abnormal lymph node enlargement seen in the abdomen and pelvis. Reproductive: Prostate is unremarkable. Other: No abdominal wall hernia or abnormality. No abdominopelvic ascites. Musculoskeletal: Fixation hardware noted at the spine at the L4-5 level. Slight curvature of the spine. Multilevel degenerative changes. There is a sclerotic area involving the vertebral body superior right at at L2. This has been stable since at least August 2020. Possible bone island. IMPRESSION: VASCULAR No significant stenosis, vessel occlusion or aneurysm. No areas of active extravasation. Anomalous celiac axis. NON-VASCULAR Focal short segment wall thickening with stranding along the proximal descending colon. Please correlate for an area of acute colitis. Recommend follow-up to confirm resolution. No obstruction, free air. Fatty liver infiltration with a small cyst and separate hemangioma. 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 Electronically Signed   By: Jill Side M.D.   On:  03/02/2022 17:38       Assessment / Plan:    57 y/o female here with the following:   Suspected ischemic colitis LLQ pain Rectal bleeding Fatty liver   CT scan to evaluate symptoms showed focal left-sided colitis. Given her presentation of symptoms this is almost certainly represents ischemic colitis.  Unclear why she may have had this.  No recent antibiotic use or constipation.  No new meds other than Eliquis which would not be thought to cause this.  She thinks she had some hypotension in recent days when her heart rate was low, perhaps that precipitated this.  She has not had any further bleeding.  I had recommended stool studies to rule out infection although she has not had any further bowel movements since has been here.  Infectious colitis appears very unlikely at this point.  Her colonoscopy is up-to-date, we do not need to repeat that at this point.  No leukocytosis.  Tolerating a soft diet.  Also back on Eliquis which she can continue.  I think she can be discharged home later today with follow-up with Dr. Wilhemena Durie her primary GI physician.  Otherwise, incidentally noted to have fatty liver on CT scan which we discussed.  She does not drink any alcohol.  Her LFTs are normal.  She needs to focus on weight loss as outpatient and needs outpatient follow-up for that issue.  She understands and agrees with the plan.   PLAN: - stable for discharge home today - soft / bland diet for several days - avoid hypotension - okay to resume Eliquis - work weight loss for fatty  liver as outpatient - she can follow up with her primary GI MD as outpatient  We will sign off, please call with questions / concerns in the interim.  Jolly Mango, MD [facility]

## 2022-03-05 ENCOUNTER — Ambulatory Visit: Payer: Self-pay

## 2022-03-05 ENCOUNTER — Telehealth: Payer: Self-pay | Admitting: *Deleted

## 2022-03-05 ENCOUNTER — Telehealth: Payer: Self-pay

## 2022-03-05 NOTE — Telephone Encounter (Signed)
Pt calling requesting to have hospital follow up.  She has appointment on 03/25/2022 I changed that to be a hospital follow up and looks like labs were going to need to be drawn prior, please evaluate what needs to be done, let me know when they are in and I will contact pt to schedule lab visit prior to this appt.

## 2022-03-05 NOTE — Telephone Encounter (Signed)
Transition Care Management Unsuccessful Follow-up Telephone Call  Date of discharge and from where:  [facility] 03/04/2022  Attempts:  1st Attempt  Reason for unsuccessful TCM follow-up call:  Left voice message Juanda Crumble, [place] Direct Dial [phone]

## 2022-03-05 NOTE — Telephone Encounter (Signed)
  Chief Complaint: Wheezing - chest tightness Symptoms: Fatigue, SOB, fever 101.8 Frequency: This morning Pertinent Negatives: Patient denies  Disposition: '[x]'$ ED /'[]'$ Urgent Care (no appt availability in office) / '[]'$ Appointment(In office/virtual)/ '[]'$  [facility]/ '[]'$ Home Care/ '[]'$ Refused Recommended Disposition /'[]'$ [facility]/ '[]'$  Follow-up with PCP Additional Notes: PT has hx of afib. Pt is reporting SOB, chest tightness, wheezing, fever 101.8 and fatigue. PT will go to the ED for care.    Summary: Fever 101.8   Has fever of 101.8 + fatigue     Reason for Disposition  [1] MODERATE difficulty breathing (e.g., speaks in phrases, SOB even at rest, pulse 100-120) AND [2] NEW-onset or WORSE than normal  Answer Assessment - Initial Assessment Questions 1. RESPIRATORY STATUS: "Describe your breathing?" (e.g., wheezing, shortness of breath, unable to speak, severe coughing)      Wheezing 2. ONSET: "When did this breathing problem begin?"      Today 3. PATTERN "Does the difficult breathing come and go, or has it been constant since it started?"      Constant 4. SEVERITY: "How bad is your breathing?" (e.g., mild, moderate, severe)    - MILD: No SOB at rest, mild SOB with walking, speaks normally in sentences, can lie down, no retractions, pulse < 100.    - MODERATE: SOB at rest, SOB with minimal exertion and prefers to sit, cannot lie down flat, speaks in phrases, mild retractions, audible wheezing, pulse 100-120.    - SEVERE: Very SOB at rest, speaks in single words, struggling to breathe, sitting hunched forward, retractions, pulse > 120       moderate 5. RECURRENT SYMPTOM: "Have you had difficulty breathing before?" If Yes, ask: "When was the last time?" and "What happened that time?"      Not recently 6. CARDIAC HISTORY: "Do you have any history of heart disease?" (e.g., heart attack, angina, bypass surgery, angioplasty)      afib 7. LUNG HISTORY: "Do you have any  history of lung disease?"  (e.g., pulmonary embolus, asthma, emphysema)     no 8. CAUSE: "What do you think is causing the breathing problem?"      NO idea 9. OTHER SYMPTOMS: "Do you have any other symptoms? (e.g., dizziness, runny nose, cough, chest pain, fever)     Weakness, fever 101.8 10. O2 SATURATION MONITOR:  "Do you use an oxygen saturation monitor (pulse oximeter) at home?" If Yes, ask: "What is your reading (oxygen level) today?" "What is your usual oxygen saturation reading?" (e.g., 95%)        11. PREGNANCY: "Is there any chance you are pregnant?" "When was your last menstrual period?"        12. TRAVEL: "Have you traveled out of the country in the last month?" (e.g., travel history, exposures)  Protocols used: Breathing Difficulty-A-AH

## 2022-03-08 ENCOUNTER — Emergency Department (HOSPITAL_COMMUNITY): Payer: Federal, State, Local not specified - PPO

## 2022-03-08 ENCOUNTER — Other Ambulatory Visit: Payer: Self-pay

## 2022-03-08 ENCOUNTER — Other Ambulatory Visit: Payer: Self-pay | Admitting: Nurse Practitioner

## 2022-03-08 ENCOUNTER — Encounter (HOSPITAL_COMMUNITY): Payer: Self-pay

## 2022-03-08 ENCOUNTER — Emergency Department (HOSPITAL_COMMUNITY)
Admission: EM | Admit: 2022-03-08 | Discharge: 2022-03-08 | Disposition: A | Discharge status: Home / Self Care | Payer: Federal, State, Local not specified - PPO | Attending: Emergency Medicine | Admitting: Emergency Medicine

## 2022-03-08 ENCOUNTER — Ambulatory Visit: Payer: Self-pay

## 2022-03-08 DIAGNOSIS — E119 Type 2 diabetes mellitus without complications: Secondary | ICD-10-CM | POA: Diagnosis not present

## 2022-03-08 DIAGNOSIS — J101 Influenza due to other identified influenza virus with other respiratory manifestations: Secondary | ICD-10-CM | POA: Insufficient documentation

## 2022-03-08 DIAGNOSIS — Z7984 Long term (current) use of oral hypoglycemic drugs: Secondary | ICD-10-CM | POA: Insufficient documentation

## 2022-03-08 DIAGNOSIS — C9590 Leukemia, unspecified not having achieved remission: Secondary | ICD-10-CM | POA: Insufficient documentation

## 2022-03-08 DIAGNOSIS — Z8543 Personal history of malignant neoplasm of ovary: Secondary | ICD-10-CM | POA: Diagnosis not present

## 2022-03-08 DIAGNOSIS — I10 Essential (primary) hypertension: Secondary | ICD-10-CM | POA: Insufficient documentation

## 2022-03-08 DIAGNOSIS — Z7901 Long term (current) use of anticoagulants: Secondary | ICD-10-CM | POA: Insufficient documentation

## 2022-03-08 DIAGNOSIS — R059 Cough, unspecified: Secondary | ICD-10-CM | POA: Diagnosis present

## 2022-03-08 DIAGNOSIS — Z1152 Encounter for screening for COVID-19: Secondary | ICD-10-CM | POA: Diagnosis not present

## 2022-03-08 DIAGNOSIS — J189 Pneumonia, unspecified organism: Secondary | ICD-10-CM | POA: Diagnosis not present

## 2022-03-08 DIAGNOSIS — R7989 Other specified abnormal findings of blood chemistry: Secondary | ICD-10-CM

## 2022-03-08 DIAGNOSIS — Z794 Long term (current) use of insulin: Secondary | ICD-10-CM | POA: Diagnosis not present

## 2022-03-08 DIAGNOSIS — E1169 Type 2 diabetes mellitus with other specified complication: Secondary | ICD-10-CM

## 2022-03-08 LAB — RESP PANEL BY RT-PCR (RSV, FLU A&B, COVID)  RVPGX2
Influenza A by PCR: NEGATIVE
Influenza B by PCR: POSITIVE — AB
Resp Syncytial Virus by PCR: NEGATIVE
SARS Coronavirus 2 by RT PCR: NEGATIVE

## 2022-03-08 LAB — CBC WITH DIFFERENTIAL/PLATELET
Abs Immature Granulocytes: 0.02 10*3/uL (ref 0.00–0.07)
Basophils Absolute: 0 10*3/uL (ref 0.0–0.1)
Basophils Relative: 1 %
Eosinophils Absolute: 0.1 10*3/uL (ref 0.0–0.5)
Eosinophils Relative: 3 %
HCT: 44 % (ref 36.0–46.0)
Hemoglobin: 14.6 g/dL (ref 12.0–15.0)
Immature Granulocytes: 1 %
Lymphocytes Relative: 43 %
Lymphs Abs: 1.4 10*3/uL (ref 0.7–4.0)
MCH: 26.9 pg (ref 26.0–34.0)
MCHC: 33.2 g/dL (ref 30.0–36.0)
MCV: 81 fL (ref 80.0–100.0)
Monocytes Absolute: 0.2 10*3/uL (ref 0.1–1.0)
Monocytes Relative: 8 %
Neutro Abs: 1.5 10*3/uL — ABNORMAL LOW (ref 1.7–7.7)
Neutrophils Relative %: 44 %
Platelets: 214 10*3/uL (ref 150–400)
RBC: 5.43 MIL/uL — ABNORMAL HIGH (ref 3.87–5.11)
RDW: 14.2 % (ref 11.5–15.5)
WBC: 3.2 10*3/uL — ABNORMAL LOW (ref 4.0–10.5)
nRBC: 0 % (ref 0.0–0.2)

## 2022-03-08 LAB — CBC
HCT: 43.7 % (ref 36.0–46.0)
Hemoglobin: 14.5 g/dL (ref 12.0–15.0)
MCH: 26.9 pg (ref 26.0–34.0)
MCHC: 33.2 g/dL (ref 30.0–36.0)
MCV: 81.1 fL (ref 80.0–100.0)
Platelets: 204 10*3/uL (ref 150–400)
RBC: 5.39 MIL/uL — ABNORMAL HIGH (ref 3.87–5.11)
RDW: 14.1 % (ref 11.5–15.5)
WBC: 3 10*3/uL — ABNORMAL LOW (ref 4.0–10.5)
nRBC: 0 % (ref 0.0–0.2)

## 2022-03-08 LAB — I-STAT BETA HCG BLOOD, ED (MC, WL, AP ONLY): I-stat hCG, quantitative: <5 m[IU]/mL (ref <5)

## 2022-03-08 LAB — BASIC METABOLIC PANEL
Anion gap: 12 (ref 5–15)
BUN: 11 mg/dL (ref 6–20)
CO2: 22 mmol/L (ref 22–32)
Calcium: 8.9 mg/dL (ref 8.9–10.3)
Chloride: 102 mmol/L (ref 98–111)
Creatinine, Ser: 0.87 mg/dL (ref 0.44–1.00)
GFR, Estimated: >60 mL/min (ref >60)
Glucose, Bld: 195 mg/dL — ABNORMAL HIGH (ref 70–99)
Potassium: 3.9 mmol/L (ref 3.5–5.1)
Sodium: 136 mmol/L (ref 135–145)

## 2022-03-08 LAB — TROPONIN I (HIGH SENSITIVITY)
Troponin I (High Sensitivity): 3 ng/L (ref <18)
Troponin I (High Sensitivity): 3 ng/L (ref <18)

## 2022-03-08 MED ORDER — IPRATROPIUM-ALBUTEROL 0.5-2.5 (3) MG/3ML IN SOLN
3.0000 mL | Freq: Once | RESPIRATORY_TRACT | Status: AC
  Administered 2022-03-08: 3 mL via RESPIRATORY_TRACT
  Filled 2022-03-08: qty 3

## 2022-03-08 MED ORDER — IOHEXOL 350 MG/ML SOLN
75.0000 mL | Freq: Once | INTRAVENOUS | Status: AC | PRN
  Administered 2022-03-08: 75 mL via INTRAVENOUS

## 2022-03-08 MED ORDER — ALBUTEROL SULFATE HFA 108 (90 BASE) MCG/ACT IN AERS: 1.0000 | INHALATION_SPRAY | Freq: Four times a day (QID) | RESPIRATORY_TRACT | 0 refills | Status: DC | PRN

## 2022-03-08 MED ORDER — SODIUM CHLORIDE 0.9 % IV BOLUS
1000.0000 mL | Freq: Once | INTRAVENOUS | Status: AC
  Administered 2022-03-08: 1000 mL via INTRAVENOUS

## 2022-03-08 MED ORDER — BENZONATATE 200 MG PO CAPS: 200.0000 mg | ORAL_CAPSULE | Freq: Three times a day (TID) | ORAL | 0 refills | Status: AC

## 2022-03-08 MED ORDER — AZITHROMYCIN 250 MG PO TABS: 250.0000 mg | ORAL_TABLET | Freq: Every day | ORAL | 0 refills | Status: DC

## 2022-03-08 MED ORDER — AZITHROMYCIN 250 MG PO TABS
500.0000 mg | ORAL_TABLET | Freq: Once | ORAL | Status: AC
  Administered 2022-03-08: 500 mg via ORAL
  Filled 2022-03-08: qty 2

## 2022-03-08 MED ORDER — SODIUM CHLORIDE 0.9 % IV SOLN
1.0000 g | Freq: Once | INTRAVENOUS | Status: AC
  Administered 2022-03-08: 1 g via INTRAVENOUS
  Filled 2022-03-08: qty 10

## 2022-03-08 NOTE — ED Notes (Signed)
Patient transported to CT 

## 2022-03-08 NOTE — ED Triage Notes (Signed)
Reports was here last week with ischemic colitis.  Reports started on Friday with sob and fever and it has not got any better. Reports she feels like she has the flu.

## 2022-03-08 NOTE — ED Provider Notes (Signed)
[facility] Provider Note   CSN: [id number] Arrival date & time: 03/08/22  1194     History  Chief Complaint  Patient presents with   Shortness of Breath    Morgan [name] is a 57 y.o. female.  57 year old female with past medical history of A-fib (recently restarted her Eliquis), GERD, remote history of ovarian cancer, diabetes, hyperlipidemia, recently admitted to the hospital for ischemic colitis presents with complaint of nonproductive cough, shortness of breath with wheezing, fevers onset 3 days ago after leaving hospital 4 days ago.  States her temperature was 101.8, fevers have resolved but continues to have nonproductive cough, shortness of breath, wheezing.  Patient is a never smoker, no respiratory history.  No known sick contacts.  Denies chest pain, lower extremity edema, abdominal pain.  No other complaints or concerns.       Home Medications Prior to Admission medications   Medication Sig Start Date End Date Taking? Authorizing Provider  albuterol (VENTOLIN HFA) 108 (90 Base) MCG/ACT inhaler Inhale 1-2 puffs into the lungs every 6 (six) hours as needed for wheezing or shortness of breath. 03/08/22  Yes Tacy Learn, PA-C  azithromycin (ZITHROMAX) 250 MG tablet Take 1 tablet (250 mg total) by mouth daily. Take first 2 tablets together, then 1 every day until finished. 03/08/22  Yes Tacy Learn, PA-C  benzonatate (TESSALON) 200 MG capsule Take 1 capsule (200 mg total) by mouth every 8 (eight) hours for 10 days. 03/08/22 03/18/22 Yes Tacy Learn, PA-C  acetaminophen (TYLENOL) 500 MG tablet Take 1,000 mg by mouth daily as needed for moderate pain, mild pain, headache or fever.    [provider]  ALPRAZOLAM XR 1 MG 24 hr tablet Take 1 mg by mouth daily as needed for anxiety. 05/08/21   [provider]  apixaban (ELIQUIS) 5 MG TABS tablet TAKE 1 TABLET BY MOUTH 2 TIMES DAILY. 03/01/22   Jettie Booze,  MD  ARIPiprazole (ABILIFY) 15 MG tablet Take 15 mg by mouth at bedtime. 12/21/17   Noemi Chapel, NP  Dapagliflozin-metFORMIN HCl ER (XIGDUO XR) 11-998 MG TB24 Take 1 tablet by mouth daily. Patient taking differently: Take 1 tablet by mouth at bedtime. 10/15/21   Lorrene Reid, PA-C  DULoxetine (CYMBALTA) 60 MG capsule Take 60 mg by mouth at bedtime. 11/04/16   [provider]  famotidine (PEPCID) 20 MG tablet Take 1 tablet (20 mg total) by mouth 2 (two) times daily. Patient not taking: Reported on 03/03/2022 09/06/21   Carrie Mew, MD  ferrous sulfate 325 (65 FE) MG EC tablet Take 1 tablet (325 mg total) by mouth 2 (two) times daily. Patient not taking: Reported on 03/03/2022 10/07/21   Ladell Pier, MD  fluticasone [facility]) 50 MCG/ACT nasal spray Place 1 spray into both nostrils daily as needed for allergies.    [provider]  insulin aspart (NOVOLOG FLEXPEN) 100 UNIT/ML FlexPen Inject 10 units into the skin before breakfast and lunch. Inject 13 units into the skin before supper. Patient taking differently: Inject 10-13 Units into the skin See admin instructions. Inject 10 units before breakfast and lunch, inject 13 units before supper. 12/24/21   Abonza, Herb Grays, PA-C  lidocaine (LIDODERM) 5 % Place 1 patch onto the skin daily as needed (pain).    [provider]  losartan (COZAAR) 100 MG tablet Take 1 tablet (100 mg total) by mouth daily. Take 1 tablet daily by mouth. Patient taking differently: Take  100 mg by mouth at bedtime. 04/07/21   Lorrene Reid, PA-C  omeprazole (PRILOSEC OTC) 20 MG tablet Take 20 mg by mouth at bedtime.    [provider]  ondansetron (ZOFRAN-ODT) 4 MG disintegrating tablet '4mg'$  ODT q4 hours prn nausea/vomit Patient taking differently: Take 4 mg by mouth daily as needed for nausea or vomiting. 09/20/21   Deno Etienne, DO  tiZANidine (ZANAFLEX) 4 MG tablet Take 1 tablet by mouth at bedtime as needed for muscle spasms. 05/14/21    [provider]      Allergies    Clindamycin/lincomycin, Lamictal [lamotrigine], and Lipitor [atorvastatin]    Review of Systems   Review of Systems Negative except as per HPI Physical Exam Updated Vital Signs BP (!) 131/52   Pulse (!) 51   Temp 98.1 F (36.7 C) (Oral)   Resp (!) 24   Ht '5\' 1"'$  (1.549 m)   Wt 88.5 kg   LMP 05/12/2011   SpO2 100%   BMI 36.84 kg/m  Physical Exam Vitals and nursing note reviewed.  Constitutional:      General: She is not in acute distress.    Appearance: She is well-developed. She is ill-appearing. She is not diaphoretic.  HENT:     Head: Normocephalic and atraumatic.     Mouth/Throat:     Mouth: Mucous membranes are moist.  Neck:     Vascular: No JVD.  Cardiovascular:     Rate and Rhythm: Regular rhythm. Bradycardia present.  Pulmonary:     Effort: Tachypnea present.     Breath sounds: Wheezing present.  Chest:     Chest wall: No tenderness.  Abdominal:     Palpations: Abdomen is soft.     Tenderness: There is no abdominal tenderness.  Musculoskeletal:     Cervical back: Neck supple.     Right lower leg: No tenderness. No edema.     Left lower leg: No tenderness. No edema.  Skin:    General: Skin is warm and dry.  Neurological:     Mental Status: She is alert and oriented to person, place, and time.  Psychiatric:        Behavior: Behavior normal.     ED Results / Procedures / Treatments   Labs (all labs ordered are listed, but only abnormal results are displayed) Labs Reviewed  RESP PANEL BY RT-PCR (RSV, FLU A&B, COVID)  RVPGX2 - Abnormal; Notable for the following components:      Result Value   Influenza B by PCR POSITIVE (*)    All other components within normal limits  BASIC METABOLIC PANEL - Abnormal; Notable for the following components:   Glucose, Bld 195 (*)    All other components within normal limits  CBC - Abnormal; Notable for the following components:   WBC 3.0 (*)    RBC 5.39 (*)    All other  components within normal limits  CBC WITH DIFFERENTIAL/PLATELET - Abnormal; Notable for the following components:   WBC 3.2 (*)    RBC 5.43 (*)    Neutro Abs 1.5 (*)    All other components within normal limits  I-STAT BETA HCG BLOOD, ED ([facility], [facility], [facility] ONLY)  TROPONIN I (HIGH SENSITIVITY)  TROPONIN I (HIGH SENSITIVITY)    EKG EKG Interpretation  Date/Time:  Monday March 08 2022 07:13:55 EST Ventricular Rate:  76 PR Interval:  142 QRS Duration: 74 QT Interval:  364 QTC Calculation: 409 R Axis:   62 Text Interpretation: Normal sinus rhythm  Normal ECG When compared with ECG of 06-Jan-2022 03:00, PREVIOUS ECG IS PRESENT when compared to prior, similar appearance. NO STEMI Confirmed by Antony Blackbird [phone]) on 03/08/2022 8:29:21 AM  Radiology CT Angio Chest PE W/Cm &/Or Wo Cm  Result Date: 03/08/2022 CLINICAL DATA:  Pulmonary embolism suspected, high probability. EXAM: CT ANGIOGRAPHY CHEST WITH CONTRAST TECHNIQUE: Multidetector CT imaging of the chest was performed using the standard protocol during bolus administration of intravenous contrast. Multiplanar CT image reconstructions and MIPs were obtained to evaluate the vascular anatomy. RADIATION DOSE REDUCTION: This exam was performed according to the departmental dose-optimization program which includes automated exposure control, adjustment of the mA and/or kV according to patient size and/or use of iterative reconstruction technique. CONTRAST:  23m OMNIPAQUE IOHEXOL 350 MG/ML SOLN COMPARISON:  CTA chest 04/15/2021. FINDINGS: Cardiovascular: Satisfactory opacification of the pulmonary arteries to the segmental level. No evidence of pulmonary embolism. Normal heart size. No pericardial effusion. Atherosclerotic calcifications of the aortic arch. Mediastinum/Nodes: No enlarged mediastinal, hilar, or axillary lymph nodes. Thyroid gland, trachea, and esophagus demonstrate no significant findings. Lungs/Pleura: Patchy ground-glass and  consolidative opacities in the right upper middle lobes, likely infectious or inflammatory in etiology. Mosaic attenuation of the lower lungs likely secondary to phase of respiration. No pleural effusion or pneumothorax. Upper Abdomen: The left kidney is not visualized. Hepatic steatosis. Musculoskeletal: Partially visualized hardware from prior lower cervical ACDF. No suspicious bone lesions. Review of the MIP images confirms the above findings. IMPRESSION: 1. No evidence of pulmonary embolism. 2. Patchy ground-glass and consolidative opacities in the right upper and middle lobes, likely infectious or inflammatory in etiology. 3. Hepatic steatosis. 4.  Aortic Atherosclerosis (ICD10-I70.0). Electronically Signed   By: WEmmit AlexandersM.D.   On: 03/08/2022 09:34   DG Chest 2 View  Result Date: 03/08/2022 CLINICAL DATA:  Cough and fever with shortness of breath EXAM: CHEST - 2 VIEW COMPARISON:  01/06/2022 FINDINGS: Normal heart size and mediastinal contours. No acute infiltrate or edema. No effusion or pneumothorax. No acute osseous findings. IMPRESSION: No visible pneumonia. Electronically Signed   By: JJorje GuildM.D.   On: 03/08/2022 07:41    Procedures Procedures    Medications Ordered in ED Medications  azithromycin (ZITHROMAX) tablet 500 mg (has no administration in time range)  sodium chloride 0.9 % bolus 1,000 mL (0 mLs Intravenous Stopped 03/08/22 1012)  ipratropium-albuterol (DUONEB) 0.5-2.5 (3) MG/3ML nebulizer solution 3 mL (3 mLs Nebulization Given 03/08/22 0845)  iohexol (OMNIPAQUE) 350 MG/ML injection 75 mL (75 mLs Intravenous Contrast Given 03/08/22 0913)  cefTRIAXone (ROCEPHIN) 1 g in sodium chloride 0.9 % 100 mL IVPB (1 g Intravenous New Bag/Given 03/08/22 0957)    ED Course/ Medical Decision Making/ A&P                             Medical Decision Making Amount and/or Complexity of Data Reviewed Labs: ordered. Radiology: ordered.  Risk Prescription drug  management.   This patient presents to the ED for concern of cough, congestion, wheezing and shortness of breath, this involves an extensive number of treatment options, and is a complaint that carries with it a high risk of complications and morbidity.  The differential diagnosis includes but not limited to viral URI such as RSV/COVID/flu, bronchitis, CHF, PE   Co morbidities that complicate the patient evaluation  A-fib, GERD, hypertension, hyperlipidemia, diabetes   Additional history obtained:  External records from outside source obtained and reviewed including  discharge summary dated 03/04/2022, admitted for ischemic colitis, had been out of her Eliquis due to financial constraints, resumed 4 days prior to her presentation with bloody stools and abdominal pain.   Lab Tests:  I Ordered, and personally interpreted labs.  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  BMP with elevated glucose, nonfasting, diabetic patient.  hCG negative.  Troponins are unchanged at 3 and 3.  Respiratory panel is positive for influenza B, negative for RSV and COVID.   Imaging Studies ordered:  I ordered imaging studies including chest x-ray, CTA PE study I independently visualized and interpreted imaging which showed chest x-ray shows no acute process.  CT PE negative for PE, does show right side pneumonia I agree with the radiologist interpretation   Cardiac Monitoring: / EKG:  The patient was maintained on a cardiac monitor.  I personally viewed and interpreted the cardiac monitored which showed an underlying rhythm of: NSR, rate 76   Consultations Obtained:  I requested consultation with the ER attending, Dr. Sherry Ruffing,  and discussed lab and imaging findings as well as pertinent plan - they recommend: Agree with plan of care   Problem List / ED Course / Critical interventions / Medication  management  57 year old female with SHOB, non productive cough, fever (resolved), suspects she has the flu. Recently admitted with ischemic colitis, on thinners after having been noncompliant for period of time, resumed about a week ago.  Denies abdominal pain, abdomen is soft and nontender.  Due to concern for having been off of her Eliquis with history of A-fib and now short of breath, consider possible PE, PE study is negative.  Patient is positive for influenza B with mild leukopenia likely secondary to the flu.  CT chest does show a right-sided pneumonia which may be likely viral in nature however will cover with antibiotics prophylactically.  Patient is able to maintain O2 sat greater than 90 ambulating, feeling improved after neb and treatment today.  Will discharge home with Tessalon, albuterol inhaler, Zithromax.  Patient is scheduled to follow-up with her PCP this week.  Provided with return to ER precautions. I ordered medication including DuoNeb, Rocephin, Zithromax for wheezing, pneumonia Reevaluation of the patient after these medicines showed that the patient improved I have reviewed the patients home medicines and have made adjustments as needed   Social Determinants of Health:  Has PCP and has scheduled follow up this week   Test / Admission - Considered:  Consider admission for influenza with PNA, likely viral PNA, will cover with abx, able to ambulate and maintain O2 greater than 90%         Final Clinical Impression(s) / ED Diagnoses Final diagnoses:  Influenza B  Community acquired pneumonia of right lung, unspecified part of lung    Rx / DC Orders ED Discharge Orders          Ordered    azithromycin (ZITHROMAX) 250 MG tablet  Daily        03/08/22 1056    albuterol (VENTOLIN HFA) 108 (90 Base) MCG/ACT inhaler  Every 6 hours PRN        03/08/22 1056    benzonatate (TESSALON) 200 MG capsule  Every 8 hours        03/08/22 1056              Tacy Learn, PA-C 03/08/22 1103    Tegeler, Harrell Gave  J, MD 03/08/22 1557

## 2022-03-08 NOTE — Discharge Instructions (Addendum)
Start Zithromax tomorrow.  Complete course. Tessalon as needed as prescribed for cough. Albuterol inhaler as needed as prescribed for cough and wheezing.  Recheck with your PCP as scheduled.  Return to ER for worsening or concerning symptoms.

## 2022-03-08 NOTE — Telephone Encounter (Signed)
I have ordered CBC, BMP, and HgbA1c to be done when she has lab appointment

## 2022-03-08 NOTE — ED Notes (Signed)
Patient Alert and oriented to baseline. Stable and ambulatory to baseline. Patient verbalized understanding of the discharge instructions.  Patient belongings were taken by the patient.   

## 2022-03-08 NOTE — ED Notes (Signed)
Pt ambulatory independently. Gait steady and even. Oxygen saturations remained 99% throughout ambulation.

## 2022-03-08 NOTE — Telephone Encounter (Signed)
Transition Care Management Follow-up Telephone Call Date of discharge and from where: [name] 03/04/2022 How have you been since you were released from the hospital? Not much better Any questions or concerns? No  Items Reviewed: Did the pt receive and understand the discharge instructions provided? Yes  Medications obtained and verified? Yes  Other? No  Any new allergies since your discharge? No  Dietary orders reviewed? Yes Do you have support at home? Yes   Home Care and Equipment/Supplies: Were home health services ordered? no If so, what is the name of the agency? N/a  Has the agency set up a time to come to the patient's home? no Were any new equipment or medical supplies ordered?  No What is the name of the medical supply agency? N/a Were you able to get the supplies/equipment? no Do you have any questions related to the use of the equipment or supplies? No  Functional Questionnaire: (I = Independent and D = Dependent) ADLs: I  Bathing/Dressing- I  Meal Prep- I  Eating- I  Maintaining continence- I  Transferring/Ambulation- I  Managing Meds- I  Follow up appointments reviewed:  PCP Hospital f/u appt confirmed? No  NO AVAIL APPTS WITHIN 14 DAys [place] [facility] f/u appt confirmed? No  Are transportation arrangements needed? No  If their condition worsens, is the pt aware to call PCP or go to the Emergency Dept.? Yes Was the patient provided with contact information for the PCP's office or ED? Yes Was to pt encouraged to call back with questions or concerns? Yes Juanda Crumble, LPN [place] [facility]ial [phone]

## 2022-03-09 ENCOUNTER — Telehealth: Payer: Self-pay | Admitting: *Deleted

## 2022-03-09 NOTE — Telephone Encounter (Signed)
Pt is scheduled for lab to have these drawn week before visit. Sloane Palmer Zimmerman Rumple, CMA

## 2022-03-09 NOTE — Telephone Encounter (Signed)
LVM for pt to call back to schedule lab visit week before hospital follow up.  Please assist in getting this scheduled if she calls back. Rheba Diamond Zimmerman Rumple, CMA

## 2022-03-09 NOTE — Telephone Encounter (Signed)
Hi I have not seen this patient since 2020

## 2022-03-16 ENCOUNTER — Other Ambulatory Visit: Payer: Federal, State, Local not specified - PPO

## 2022-03-16 DIAGNOSIS — E1169 Type 2 diabetes mellitus with other specified complication: Secondary | ICD-10-CM

## 2022-03-16 DIAGNOSIS — R7989 Other specified abnormal findings of blood chemistry: Secondary | ICD-10-CM

## 2022-03-17 LAB — CBC
Hematocrit: 39.9 % (ref 34.0–46.6)
Hemoglobin: 13 g/dL (ref 11.1–15.9)
MCH: 26.2 pg — ABNORMAL LOW (ref 26.6–33.0)
MCHC: 32.6 g/dL (ref 31.5–35.7)
MCV: 80 fL (ref 79–97)
Platelets: 215 10*3/uL (ref 150–450)
RBC: 4.96 x10E6/uL (ref 3.77–5.28)
RDW: 14.9 % (ref 11.7–15.4)
WBC: 4.1 10*3/uL (ref 3.4–10.8)

## 2022-03-17 LAB — BASIC METABOLIC PANEL
BUN/Creatinine Ratio: 12 (ref 9–23)
BUN: 8 mg/dL (ref 6–24)
CO2: 22 mmol/L (ref 20–29)
Calcium: 9.3 mg/dL (ref 8.7–10.2)
Chloride: 104 mmol/L (ref 96–106)
Creatinine, Ser: 0.65 mg/dL (ref 0.57–1.00)
Glucose: 137 mg/dL — ABNORMAL HIGH (ref 70–99)
Potassium: 3.8 mmol/L (ref 3.5–5.2)
Sodium: 139 mmol/L (ref 134–144)
eGFR: 105 mL/min/{1.73_m2} (ref >59)

## 2022-03-17 LAB — HEMOGLOBIN A1C
Est. average glucose Bld gHb Est-mCnc: 166 mg/dL
Hgb A1c MFr Bld: 7.4 % — ABNORMAL HIGH (ref 4.8–5.6)

## 2022-03-19 ENCOUNTER — Other Ambulatory Visit: Payer: Self-pay | Admitting: Nurse Practitioner

## 2022-03-19 ENCOUNTER — Other Ambulatory Visit (HOSPITAL_COMMUNITY): Payer: Self-pay

## 2022-03-19 DIAGNOSIS — I10 Essential (primary) hypertension: Secondary | ICD-10-CM

## 2022-03-19 DIAGNOSIS — E1169 Type 2 diabetes mellitus with other specified complication: Secondary | ICD-10-CM

## 2022-03-19 DIAGNOSIS — Z794 Long term (current) use of insulin: Secondary | ICD-10-CM

## 2022-03-19 DIAGNOSIS — I480 Paroxysmal atrial fibrillation: Secondary | ICD-10-CM

## 2022-03-19 MED ORDER — APIXABAN 5 MG PO TABS: 5.0000 mg | ORAL_TABLET | Freq: Two times a day (BID) | ORAL | 0 refills | Status: DC

## 2022-03-25 ENCOUNTER — Ambulatory Visit: Payer: Federal, State, Local not specified - PPO | Admitting: Nurse Practitioner

## 2022-03-25 ENCOUNTER — Encounter: Payer: Self-pay | Admitting: Nurse Practitioner

## 2022-03-25 ENCOUNTER — Encounter (HOSPITAL_COMMUNITY): Payer: Self-pay | Admitting: *Deleted

## 2022-03-25 VITALS — BP 100/62 | HR 70 | Ht 61.0 in | Wt 187.0 lb

## 2022-03-25 DIAGNOSIS — I10 Essential (primary) hypertension: Secondary | ICD-10-CM

## 2022-03-25 DIAGNOSIS — Z09 Encounter for follow-up examination after completed treatment for conditions other than malignant neoplasm: Secondary | ICD-10-CM | POA: Diagnosis not present

## 2022-03-25 DIAGNOSIS — Z794 Long term (current) use of insulin: Secondary | ICD-10-CM

## 2022-03-25 DIAGNOSIS — E1169 Type 2 diabetes mellitus with other specified complication: Secondary | ICD-10-CM

## 2022-03-25 DIAGNOSIS — R52 Pain, unspecified: Secondary | ICD-10-CM

## 2022-03-25 DIAGNOSIS — K559 Vascular disorder of intestine, unspecified: Secondary | ICD-10-CM | POA: Diagnosis not present

## 2022-03-25 DIAGNOSIS — K625 Hemorrhage of anus and rectum: Secondary | ICD-10-CM

## 2022-03-25 MED ORDER — XIGDUO XR 10-1000 MG PO TB24: 1.0000 | ORAL_TABLET | Freq: Every day | ORAL | 1 refills | Status: DC

## 2022-03-25 NOTE — Progress Notes (Signed)
Established patient visit   Patient: Morgan [name]   DOB: 1965/12/13   57 y.o. Female  MRN: [id number] Visit Date: 03/25/2022   Chief Complaint  Patient presents with   Hospitalization Follow-up   Subjective    HPI  -hospital follow up  --admitted from 03/02/2022 through 03/04/2022 due to ischemic colitis.  -needs referral to GI.  -saw Dr. Vicente Males in the past for screening colonoscopy and endoscopy. Did have gastritis and precancerous polyps.  After discharge, she had to go back to the ER.  --flu which turned into pneumonia.  -dm2 --HgbA1c 7.4 --BMP and CBC were both essentially normal.  -due for CPE  Medications: Outpatient Medications Prior to Visit  Medication Sig   ALPRAZOLAM XR 1 MG 24 hr tablet Take 1 mg by mouth daily as needed for anxiety.   apixaban (ELIQUIS) 5 MG TABS tablet Take 1 tablet (5 mg total) by mouth 2 (two) times daily.   ARIPiprazole (ABILIFY) 15 MG tablet Take 15 mg by mouth at bedtime.   DULoxetine (CYMBALTA) 60 MG capsule Take 60 mg by mouth at bedtime.   fluticasone (FLONASE) 50 MCG/ACT nasal spray Place 1 spray into both nostrils daily as needed for allergies.   Insulin Aspart FlexPen (NOVOLOG) 100 UNIT/ML INJECT 10 UNITS INTO THE SKIN BEFORE BREAKFAST AND LUNCH. INJECT 13 UNITS INTO THE SKIN BEFORE SUPPER.   lidocaine (LIDODERM) 5 % Place 1 patch onto the skin daily as needed (pain).   losartan (COZAAR) 100 MG tablet TAKE 1 TABLET (100 MG TOTAL) BY MOUTH DAILY.   omeprazole (PRILOSEC OTC) 20 MG tablet Take 20 mg by mouth at bedtime.   ondansetron (ZOFRAN-ODT) 4 MG disintegrating tablet '4mg'$  ODT q4 hours prn nausea/vomit (Patient taking differently: Take 4 mg by mouth daily as needed for nausea or vomiting.)   tiZANidine (ZANAFLEX) 4 MG tablet Take 1 tablet by mouth at bedtime as needed for muscle spasms.   [DISCONTINUED] Dapagliflozin-metFORMIN HCl ER (XIGDUO XR) 11-998 MG TB24 Take 1 tablet by mouth daily. (Patient taking differently: Take 1 tablet by  mouth at bedtime.)   [DISCONTINUED] acetaminophen (TYLENOL) 500 MG tablet Take 1,000 mg by mouth daily as needed for moderate pain, mild pain, headache or fever.   [DISCONTINUED] albuterol (VENTOLIN HFA) 108 (90 Base) MCG/ACT inhaler Inhale 1-2 puffs into the lungs every 6 (six) hours as needed for wheezing or shortness of breath.   [DISCONTINUED] azithromycin (ZITHROMAX) 250 MG tablet Take 1 tablet (250 mg total) by mouth daily. Take first 2 tablets together, then 1 every day until finished.   [DISCONTINUED] famotidine (PEPCID) 20 MG tablet Take 1 tablet (20 mg total) by mouth 2 (two) times daily. (Patient not taking: Reported on 03/03/2022)   [DISCONTINUED] ferrous sulfate 325 (65 FE) MG EC tablet Take 1 tablet (325 mg total) by mouth 2 (two) times daily. (Patient not taking: Reported on 03/03/2022)   No facility-administered medications prior to visit.    Review of Systems  Constitutional:  Positive for fatigue. Negative for activity change, appetite change, chills and fever.  HENT:  Negative for congestion, postnasal drip, rhinorrhea, sinus pressure, sinus pain, sneezing and sore throat.   Eyes: Negative.   Respiratory:  Negative for cough, chest tightness, shortness of breath and wheezing.   Cardiovascular:  Negative for chest pain and palpitations.  Gastrointestinal:  Positive for abdominal pain and blood in stool. Negative for constipation, diarrhea, nausea and vomiting.  Endocrine: Negative for cold intolerance, heat intolerance, polydipsia and polyuria.  Blood sugars slightly elevated   Genitourinary:  Negative for dyspareunia, dysuria, flank pain, frequency and urgency.  Musculoskeletal:  Negative for arthralgias, back pain and myalgias.  Skin:  Negative for rash.  Allergic/Immunologic: Negative for environmental allergies.  Neurological:  Negative for dizziness, weakness and headaches.  Hematological:  Negative for adenopathy.  Psychiatric/Behavioral:  The patient is not  nervous/anxious.     Last CBC Lab Results  Component Value Date   WBC 4.1 03/16/2022   HGB 13.0 03/16/2022   HCT 39.9 03/16/2022   MCV 80 03/16/2022   MCH 26.2 (L) 03/16/2022   RDW 14.9 03/16/2022   PLT 215 16/11/9602   Last metabolic panel Lab Results  Component Value Date   GLUCOSE 137 (H) 03/16/2022   NA 139 03/16/2022   K 3.8 03/16/2022   CL 104 03/16/2022   CO2 22 03/16/2022   BUN 8 03/16/2022   CREATININE 0.65 03/16/2022   EGFR 105 03/16/2022   CALCIUM 9.3 03/16/2022   PROT 6.4 (L) 03/02/2022   ALBUMIN 3.5 03/02/2022   LABGLOB 2.7 06/24/2021   AGRATIO 1.6 06/24/2021   BILITOT 0.5 03/02/2022   ALKPHOS 95 03/02/2022   AST 28 03/02/2022   ALT 25 03/02/2022   ANIONGAP 12 03/08/2022   Last lipids Lab Results  Component Value Date   CHOL 141 04/07/2021   HDL 39 (L) 04/07/2021   LDLCALC 80 04/07/2021   TRIG 121 04/07/2021   CHOLHDL 3.6 04/07/2021   Last hemoglobin A1c Lab Results  Component Value Date   HGBA1C 7.4 (H) 03/16/2022   Last thyroid functions Lab Results  Component Value Date   TSH 1.250 06/24/2021   T4TOTAL 8.2 08/25/2018      Objective     Today's Vitals   03/25/22 1437  BP: 100/62  Pulse: 70  SpO2: 97%  Weight: 187 lb (84.8 kg)  Height: '5\' 1"'$  (1.549 m)   Body mass index is 35.33 kg/m.  BP Readings from Last 3 Encounters:  03/25/22 100/62  03/08/22 (Abnormal) 131/52  03/04/22 125/66    Wt Readings from Last 3 Encounters:  03/25/22 187 lb (84.8 kg)  03/08/22 195 lb (88.5 kg)  01/06/22 195 lb (88.5 kg)    Physical Exam Vitals and nursing note reviewed.  Constitutional:      Appearance: Normal appearance. She is well-developed.  HENT:     Head: Normocephalic and atraumatic.  Eyes:     Pupils: Pupils are equal, round, and reactive to light.  Cardiovascular:     Rate and Rhythm: Normal rate and regular rhythm.     Pulses: Normal pulses.     Heart sounds: Normal heart sounds.  Pulmonary:     Effort: Pulmonary effort  is normal.     Breath sounds: Normal breath sounds.  Abdominal:     General: Bowel sounds are decreased. There is distension.     Palpations: Abdomen is soft.     Tenderness: There is abdominal tenderness in the left upper quadrant and left lower quadrant. There is guarding.  Musculoskeletal:        General: Normal range of motion.     Cervical back: Normal range of motion and neck supple.  Lymphadenopathy:     Cervical: No cervical adenopathy.  Skin:    General: Skin is warm and dry.     Capillary Refill: Capillary refill takes less than 2 seconds.  Neurological:     General: No focal deficit present.     Mental Status: She is alert  and oriented to person, place, and time.  Psychiatric:        Mood and Affect: Mood normal.        Behavior: Behavior normal.        Thought Content: Thought content normal.        Judgment: Judgment normal.       Assessment & Plan    1. Hospital discharge follow-up Recent hospitalization for ischemic colitis. Reviewed hospital course, imaging, and labs with patient. Referral to GI placed today   2. Ischemic colitis ([place]) Continues to have left sided abdominal pain and right sided abdominal tenderness. Refer to GI for further evaluatoin and treatment.  - Ambulatory referral to Gastroenterology  3. Rectal bleeding Refer to GI for evaluation and treatment . - Ambulatory referral to Gastroenterology  4. Type 2 diabetes mellitus with other specified complication, with long-term current use of insulin (HCC) Recent HgbA1c 7.4. continue diabetic medication as prescribed. New prescription for Xigduo daily. Will complete prior authorization as needed.  - Dapagliflozin Pro-metFORMIN ER (XIGDUO XR) 11-998 MG TB24; Take 1 tablet by mouth at bedtime.  Dispense: 90 tablet; Refill: 1  5. Essential hypertension Stable. Continue bp medication as prescribed   Problem List Items Addressed This Visit       Cardiovascular and Mediastinum   Essential  hypertension     Digestive   Rectal bleeding   Relevant Orders   Ambulatory referral to Gastroenterology   Ischemic colitis [facility])   Relevant Orders   Ambulatory referral to Gastroenterology     Endocrine   Type 2 diabetes mellitus with other specified complication ([place])   Relevant Medications   Dapagliflozin Pro-metFORMIN ER (XIGDUO XR) 11-998 MG TB24   Other Visit Diagnoses     Hospital discharge follow-up    -  Primary   Pain       Relevant Orders   EKG 12-Lead (Completed)        Return in about 3 months (around 06/23/2022) for health maintenance exam, check HgbA1c and urine microalbumin, 6 months CPE.         Ronnell Freshwater, NP  [facility] at [facility] [phone] (phone) [phone] (fax)  [place]

## 2022-04-07 NOTE — Progress Notes (Unsigned)
Cardiology Office Note   Date:  04/08/2022   ID:  Morgan [name], Morgan [name] August 03, 1965, MRN [id number]  PCP:  Lorrene Reid, PA-C (Inactive)    No chief complaint on file.  Chest pain  Wt Readings from Last 3 Encounters:  04/08/22 189 lb (85.7 kg)  03/25/22 187 lb (84.8 kg)  03/08/22 195 lb (88.5 kg)       History of Present Illness: Morgan [name] is a 57 y.o. female  In April 2022, patient was seen in the emergency room and diagnosed with chest wall pain.  She had a prolonged chest pain for 6 hours.  She called EMS who took her to the hospital.     She continues to have chest pain and occasional shortness of breath.  She feels hot all of the time. She has palpitations daily, lasting a few minutes at a time.    Father has had MI.  Brothers are healthy.    Losartan was adjusted in 2022 for low BP.   Diagnosed with atrial flutter.  Started on [place] and Eliquis.  "07/08/20 and complained of intermittent palpitations.  She had a zio monitor placed.  This recorded rare PACs, PVCs as well as afib with burden of <1%.  The longest event was 49 min and 13 seconds.  There was also a more regular arrhythmia which was likely atrial flutter. Seen by Dr Rayann Heman on 11/03/20 and started on Eliquis for a CHADS2VASC score of 3 and diltiazem."  Has some chest pain that occurs randomly.  Not related to exertion.    Jan 2024 CTA chest: "Cardiovascular: Satisfactory opacification of the pulmonary arteries to the segmental level. No evidence of pulmonary embolism. Normal heart size. No pericardial effusion. Atherosclerotic calcifications of the aortic arch."   Past Medical History:  Diagnosis Date   Anxiety    Atrial fibrillation ([place])    Bipolar disorder ([place])    Breast mass    right, removed and benign   Cancer ([place]) 09/16/2019   ovarian ca no radiation no chemo   Cervical dysplasia    Diabetes mellitus without complication (HCC)    Endometriosis    GERD (gastroesophageal reflux  disease)    Hypercholesteremia    Hypertension    Migraine    Ovarian cancer ([place])    Pelvic kidney    left   Postpartum depression    hx of    Past Surgical History:  Procedure Laterality Date   ABDOMINAL HYSTERECTOMY N/A    Phreesia 07/30/2019   BACK SURGERY  02/16/2012   BREAST BIOPSY Right 2016   BREAST BIOPSY Right 2020   BREAST EXCISIONAL BIOPSY Right 2021   BREAST EXCISIONAL BIOPSY Right 2016   benign   BREAST LUMPECTOMY WITH RADIOACTIVE SEED LOCALIZATION Right 10/28/2014   Procedure: RIGHT BREAST LUMPECTOMY WITH RADIOACTIVE SEED LOCALIZATION;  Surgeon: [name], MD;  Location: [facility];  Service: General;  Laterality: Right;   BREAST LUMPECTOMY WITH RADIOACTIVE SEED LOCALIZATION Right 06/18/2019   Procedure: RIGHT BREAST RADIOACTIVE SEED LOCALIZATION LUMPECTOMY;  Surgeon: Jovita Kussmaul, MD;  Location: [facility];  Service: General;  Laterality: Right;   BREAST SURGERY N/A    Phreesia 07/30/2019   CESAREAN SECTION  02/15/2001   COLONOSCOPY WITH PROPOFOL N/A 05/01/2018   Procedure: COLONOSCOPY WITH PROPOFOL;  Surgeon: [name], MD;  Location: [facility] ENDOSCOPY;  Service: Gastroenterology;  Laterality: N/A;   COLPOSCOPY     ESOPHAGOGASTRODUODENOSCOPY (EGD) WITH PROPOFOL N/A 05/01/2018  Procedure: ESOPHAGOGASTRODUODENOSCOPY (EGD) WITH PROPOFOL;  Surgeon: [name], MD;  Location: [facility] ENDOSCOPY;  Service: Gastroenterology;  Laterality: N/A;   ESOPHAGOGASTRODUODENOSCOPY (EGD) WITH PROPOFOL N/A 11/13/2018   Procedure: ESOPHAGOGASTRODUODENOSCOPY (EGD) WITH PROPOFOL;  Surgeon: [name], MD;  Location: [facility] ENDOSCOPY;  Service: Gastroenterology;  Laterality: N/A;   GYNECOLOGIC CRYOSURGERY     NECK SURGERY  02/15/2010   PELVIC LAPAROSCOPY  02/15/1989     Current Outpatient Medications  Medication Sig Dispense Refill   ALPRAZOLAM XR 1 MG 24 hr tablet Take 1 mg by mouth daily as needed for anxiety.     apixaban (ELIQUIS) 5 MG TABS  tablet Take 1 tablet (5 mg total) by mouth 2 (two) times daily. 28 tablet 0   ARIPiprazole (ABILIFY) 15 MG tablet Take 15 mg by mouth at bedtime.     Dapagliflozin Pro-metFORMIN ER (XIGDUO XR) 11-998 MG TB24 Take 1 tablet by mouth at bedtime. 90 tablet 1   dilTIAZem HCl Coated Beads (CARTIA XT PO) Take 120 mg by mouth daily at 6 (six) AM. Pt takes 1tablet once a day.     DULoxetine (CYMBALTA) 60 MG capsule Take 60 mg by mouth at bedtime.  5   fluticasone (FLONASE) 50 MCG/ACT nasal spray Place 1 spray into both nostrils daily as needed for allergies.     Insulin Aspart FlexPen (NOVOLOG) 100 UNIT/ML INJECT 10 UNITS INTO THE SKIN BEFORE BREAKFAST AND LUNCH. INJECT 13 UNITS INTO THE SKIN BEFORE SUPPER. 15 mL 0   lidocaine (LIDODERM) 5 % Place 1 patch onto the skin daily as needed (pain).     losartan (COZAAR) 50 MG tablet Take 1 tablet (50 mg total) by mouth daily. 90 tablet 3   omeprazole (PRILOSEC OTC) 20 MG tablet Take 20 mg by mouth at bedtime.     ondansetron (ZOFRAN-ODT) 4 MG disintegrating tablet 64m ODT q4 hours prn nausea/vomit (Patient taking differently: Take 4 mg by mouth daily as needed for nausea or vomiting.) 20 tablet 0   rosuvastatin (CRESTOR) 10 MG tablet Take 1 tablet (10 mg total) by mouth daily. 90 tablet 3   tiZANidine (ZANAFLEX) 4 MG tablet Take 1 tablet by mouth at bedtime as needed for muscle spasms.     No current facility-administered medications for this visit.    Allergies:   Clindamycin/lincomycin, Lamictal [lamotrigine], and Lipitor [atorvastatin]    Social History:  The patient  reports that she has never smoked. She has never used smokeless tobacco. She reports that she does not drink alcohol and does not use drugs.   Family History:  The patient's family history includes Alcohol abuse in her father; Breast cancer (age of onset: 466 in her mother; Depression in her maternal grandmother and mother; Diabetes in her maternal grandmother and mother; Heart attack (age  of onset: 387 in her father; Heart disease in her father; Hypertension in her father; Stroke in her father; Throat cancer in her father; Thyroid disease in her mother; Uterine cancer in her maternal grandmother.    ROS:  Please see the history of present illness.   Otherwise, review of systems are positive for recent abdominal pain.   All other systems are reviewed and negative.    PHYSICAL EXAM: VS:  BP 116/76   Pulse 75   Ht 5' 1"$  (1.549 m)   Wt 189 lb (85.7 kg)   LMP 05/12/2011   SpO2 96%   BMI 35.71 kg/m  , BMI Body mass index is 35.71 kg/m. GEN: Well nourished, well developed, in  no acute distress HEENT: normal Neck: no JVD, carotid bruits, or masses Cardiac: RRR; no murmurs, rubs, or gallops,no edema  Respiratory:  clear to auscultation bilaterally, normal work of breathing GI: soft, nontender, nondistended, + BS MS: no deformity or atrophy Skin: warm and dry, no rash Neuro:  Strength and sensation are intact Psych: euthymic mood, full affect   EKG:   The ekg ordered in Jan 2024 demonstrates NSR, no ST changes   Recent Labs: 06/24/2021: TSH 1.250 03/02/2022: ALT 25 03/16/2022: BUN 8; Creatinine, Ser 0.65; Hemoglobin 13.0; Platelets 215; Potassium 3.8; Sodium 139   Lipid Panel    Component Value Date/Time   CHOL 141 04/07/2021 1131   TRIG 121 04/07/2021 1131   HDL 39 (L) 04/07/2021 1131   CHOLHDL 3.6 04/07/2021 1131   CHOLHDL 3.7 06/16/2017 0417   VLDL 29 06/16/2017 0417   LDLCALC 80 04/07/2021 1131     Other studies Reviewed: Additional studies/ records that were reviewed today with results demonstrating: .   ASSESSMENT AND PLAN:  Chest pain: Atypical.  No exertional sx.  Try to avoid the low blood pressure episodes.  No  further testing for the chest pain at this point while we are adjusting the blood pressure medicine.  If symptoms persist, could consider CTA of the coronaries. Atrial flutter: Continue Cartia CD 120 mg daily along with Eliquis for stroke  prevention.  Okay to hold Eliquis 2 days prior to colonoscopy. Palpitations: Sx controlled Hypertension: There is a concern that low blood pressure prompted ischemic colitis in January 2024, will decrease losartan to 50 mg daily.  Hopefully this will prevent any low blood pressures. Morbid obesity: Trying to get 150 minutes of activity, walking per week would be helpful for overall energy and diabetes management Diabetes: Whole food plant-based diet.  Increase fiber intake.  A1C 7.4 Aortic atherosclerosis: Start rosuvastatin 10 mg daily.  Check liver and lipid tests in 3 months and then.  Intolerant of atorvastatin.    Current medicines are reviewed at length with the patient today.  The patient concerns regarding her medicines were addressed.  The following changes have been made:  decrease losartan to avoid hypotension, start rosuvastatin  Labs/ tests ordered today include: lipids, liver in 3 months  Orders Placed This Encounter  Procedures   Lipid panel   Hepatic function panel    Recommend 150 minutes/week of aerobic exercise Low fat, low carb, high fiber diet recommended  Disposition:   FU in 6 months post AFib clinic visit in 5/24   Signed, Larae Grooms, MD  04/08/2022 4:53 PM    [address], [place], [place]  [address] Phone: [phone]; Fax: [phone]

## 2022-04-08 ENCOUNTER — Encounter: Payer: Self-pay | Admitting: Interventional Cardiology

## 2022-04-08 ENCOUNTER — Telehealth: Payer: Self-pay

## 2022-04-08 ENCOUNTER — Ambulatory Visit
Payer: Federal, State, Local not specified - PPO | Attending: Interventional Cardiology | Admitting: Interventional Cardiology

## 2022-04-08 VITALS — BP 116/76 | HR 75 | Ht 61.0 in | Wt 189.0 lb

## 2022-04-08 DIAGNOSIS — K559 Vascular disorder of intestine, unspecified: Secondary | ICD-10-CM

## 2022-04-08 DIAGNOSIS — I480 Paroxysmal atrial fibrillation: Secondary | ICD-10-CM | POA: Diagnosis not present

## 2022-04-08 DIAGNOSIS — I10 Essential (primary) hypertension: Secondary | ICD-10-CM | POA: Diagnosis not present

## 2022-04-08 DIAGNOSIS — I700 Atherosclerosis of aorta: Secondary | ICD-10-CM

## 2022-04-08 MED ORDER — LOSARTAN POTASSIUM 50 MG PO TABS: 50.0000 mg | ORAL_TABLET | Freq: Every day | ORAL | 3 refills | Status: DC

## 2022-04-08 MED ORDER — ROSUVASTATIN CALCIUM 10 MG PO TABS: 10.0000 mg | ORAL_TABLET | Freq: Every day | ORAL | 3 refills | Status: DC

## 2022-04-08 NOTE — Telephone Encounter (Signed)
Received a medical records request from [facility] requesting all medical records. Printed and faxed records to [phone].

## 2022-04-08 NOTE — Patient Instructions (Signed)
Medication Instructions:  Your physician has recommended you make the following change in your medication:  1) DECREASE losartan to 50 mg daily 2) START taking Crestor (rosuvastatin) 10 mg daily *If you need a refill on your cardiac medications before your next appointment, please call your pharmacy*  Lab Work: IN 3 MONTHS: Fasting lipids and LFTs   Follow-Up: At [facility], you and your health needs are our priority.  As part of our continuing mission to provide you with exceptional heart care, we have created designated Provider Care Teams.  These Care Teams include your primary Cardiologist (physician) and Advanced Practice Providers (APPs -  Physician Assistants and Nurse Practitioners) who all work together to provide you with the care you need, when you need it.  Your next appointment:   9 month(s)  Provider:   Larae Grooms, MD

## 2022-04-12 ENCOUNTER — Other Ambulatory Visit: Payer: Self-pay | Admitting: Interventional Cardiology

## 2022-04-22 LAB — HM DIABETES EYE EXAM

## 2022-04-26 ENCOUNTER — Encounter: Payer: Self-pay | Admitting: Family Medicine

## 2022-04-28 ENCOUNTER — Observation Stay (HOSPITAL_COMMUNITY)
Admission: EM | Admit: 2022-04-28 | Discharge: 2022-04-29 | Disposition: A | Discharge status: Home / Self Care | Payer: Federal, State, Local not specified - PPO | Attending: Pulmonary Disease | Admitting: Pulmonary Disease

## 2022-04-28 ENCOUNTER — Emergency Department (HOSPITAL_COMMUNITY): Payer: Federal, State, Local not specified - PPO

## 2022-04-28 ENCOUNTER — Encounter (HOSPITAL_COMMUNITY): Payer: Self-pay

## 2022-04-28 ENCOUNTER — Inpatient Hospital Stay (HOSPITAL_BASED_OUTPATIENT_CLINIC_OR_DEPARTMENT_OTHER): Payer: Federal, State, Local not specified - PPO

## 2022-04-28 ENCOUNTER — Other Ambulatory Visit: Payer: Self-pay

## 2022-04-28 ENCOUNTER — Ambulatory Visit: Payer: Federal, State, Local not specified - PPO | Admitting: Family Medicine

## 2022-04-28 DIAGNOSIS — E119 Type 2 diabetes mellitus without complications: Secondary | ICD-10-CM | POA: Insufficient documentation

## 2022-04-28 DIAGNOSIS — R579 Shock, unspecified: Secondary | ICD-10-CM

## 2022-04-28 DIAGNOSIS — E872 Acidosis, unspecified: Secondary | ICD-10-CM

## 2022-04-28 DIAGNOSIS — Z794 Long term (current) use of insulin: Secondary | ICD-10-CM | POA: Diagnosis not present

## 2022-04-28 DIAGNOSIS — Z7901 Long term (current) use of anticoagulants: Secondary | ICD-10-CM | POA: Insufficient documentation

## 2022-04-28 DIAGNOSIS — R001 Bradycardia, unspecified: Secondary | ICD-10-CM | POA: Diagnosis not present

## 2022-04-28 DIAGNOSIS — E860 Dehydration: Secondary | ICD-10-CM | POA: Insufficient documentation

## 2022-04-28 DIAGNOSIS — I959 Hypotension, unspecified: Secondary | ICD-10-CM | POA: Diagnosis not present

## 2022-04-28 DIAGNOSIS — Z79899 Other long term (current) drug therapy: Secondary | ICD-10-CM | POA: Diagnosis not present

## 2022-04-28 DIAGNOSIS — Z1152 Encounter for screening for COVID-19: Secondary | ICD-10-CM | POA: Diagnosis not present

## 2022-04-28 DIAGNOSIS — I10 Essential (primary) hypertension: Secondary | ICD-10-CM | POA: Insufficient documentation

## 2022-04-28 DIAGNOSIS — R9431 Abnormal electrocardiogram [ECG] [EKG]: Secondary | ICD-10-CM

## 2022-04-28 DIAGNOSIS — Z8543 Personal history of malignant neoplasm of ovary: Secondary | ICD-10-CM | POA: Insufficient documentation

## 2022-04-28 DIAGNOSIS — R531 Weakness: Secondary | ICD-10-CM | POA: Diagnosis present

## 2022-04-28 DIAGNOSIS — I4891 Unspecified atrial fibrillation: Secondary | ICD-10-CM | POA: Diagnosis not present

## 2022-04-28 DIAGNOSIS — L8990 Pressure ulcer of unspecified site, unspecified stage: Secondary | ICD-10-CM | POA: Insufficient documentation

## 2022-04-28 LAB — CBC WITH DIFFERENTIAL/PLATELET
Abs Immature Granulocytes: 0.03 10*3/uL (ref 0.00–0.07)
Basophils Absolute: 0.1 10*3/uL (ref 0.0–0.1)
Basophils Relative: 1 %
Eosinophils Absolute: 0.4 10*3/uL (ref 0.0–0.5)
Eosinophils Relative: 4 %
HCT: 38.4 % (ref 36.0–46.0)
Hemoglobin: 12.5 g/dL (ref 12.0–15.0)
Immature Granulocytes: 0 %
Lymphocytes Relative: 43 %
Lymphs Abs: 4.2 10*3/uL — ABNORMAL HIGH (ref 0.7–4.0)
MCH: 27.3 pg (ref 26.0–34.0)
MCHC: 32.6 g/dL (ref 30.0–36.0)
MCV: 83.8 fL (ref 80.0–100.0)
Monocytes Absolute: 0.5 10*3/uL (ref 0.1–1.0)
Monocytes Relative: 5 %
Neutro Abs: 4.6 10*3/uL (ref 1.7–7.7)
Neutrophils Relative %: 47 %
Platelets: 214 10*3/uL (ref 150–400)
RBC: 4.58 MIL/uL (ref 3.87–5.11)
RDW: 14.5 % (ref 11.5–15.5)
WBC: 9.8 10*3/uL (ref 4.0–10.5)
nRBC: 0 % (ref 0.0–0.2)

## 2022-04-28 LAB — MAGNESIUM: Magnesium: 1.5 mg/dL — ABNORMAL LOW (ref 1.7–2.4)

## 2022-04-28 LAB — URINALYSIS, W/ REFLEX TO CULTURE (INFECTION SUSPECTED)
Bacteria, UA: NONE SEEN
Bilirubin Urine: NEGATIVE
Glucose, UA: >=500 mg/dL — AB
Ketones, ur: NEGATIVE mg/dL
Leukocytes,Ua: NEGATIVE
Nitrite: NEGATIVE
Protein, ur: NEGATIVE mg/dL
Specific Gravity, Urine: 1.018 (ref 1.005–1.030)
pH: 6 (ref 5.0–8.0)

## 2022-04-28 LAB — I-STAT BETA HCG BLOOD, ED (MC, WL, AP ONLY): I-stat hCG, quantitative: <5 m[IU]/mL (ref <5)

## 2022-04-28 LAB — LACTIC ACID, PLASMA
Lactic Acid, Venous: 2.9 mmol/L (ref 0.5–1.9)
Lactic Acid, Venous: 2.5 mmol/L (ref 0.5–1.9)
Lactic Acid, Venous: 3.1 mmol/L (ref 0.5–1.9)
Lactic Acid, Venous: 2.6 mmol/L (ref 0.5–1.9)

## 2022-04-28 LAB — PROTIME-INR
INR: 1.3 — ABNORMAL HIGH (ref 0.8–1.2)
Prothrombin Time: 15.6 seconds — ABNORMAL HIGH (ref 11.4–15.2)

## 2022-04-28 LAB — I-STAT CHEM 8, ED
BUN: 10 mg/dL (ref 6–20)
Calcium, Ion: 1.08 mmol/L — ABNORMAL LOW (ref 1.15–1.40)
Chloride: 103 mmol/L (ref 98–111)
Creatinine, Ser: 0.9 mg/dL (ref 0.44–1.00)
Glucose, Bld: 223 mg/dL — ABNORMAL HIGH (ref 70–99)
HCT: 37 % (ref 36.0–46.0)
Hemoglobin: 12.6 g/dL (ref 12.0–15.0)
Potassium: 3.3 mmol/L — ABNORMAL LOW (ref 3.5–5.1)
Sodium: 138 mmol/L (ref 135–145)
TCO2: 23 mmol/L (ref 22–32)

## 2022-04-28 LAB — COMPREHENSIVE METABOLIC PANEL
ALT: 24 U/L (ref 0–44)
AST: 25 U/L (ref 15–41)
Albumin: 3.3 g/dL — ABNORMAL LOW (ref 3.5–5.0)
Alkaline Phosphatase: 101 U/L (ref 38–126)
Anion gap: 11 (ref 5–15)
BUN: 8 mg/dL (ref 6–20)
CO2: 22 mmol/L (ref 22–32)
Calcium: 8.8 mg/dL — ABNORMAL LOW (ref 8.9–10.3)
Chloride: 104 mmol/L (ref 98–111)
Creatinine, Ser: 0.95 mg/dL (ref 0.44–1.00)
GFR, Estimated: >60 mL/min (ref >60)
Glucose, Bld: 218 mg/dL — ABNORMAL HIGH (ref 70–99)
Potassium: 3.4 mmol/L — ABNORMAL LOW (ref 3.5–5.1)
Sodium: 137 mmol/L (ref 135–145)
Total Bilirubin: 0.5 mg/dL (ref 0.3–1.2)
Total Protein: 6.4 g/dL — ABNORMAL LOW (ref 6.5–8.1)

## 2022-04-28 LAB — ECHOCARDIOGRAM COMPLETE
AR max vel: 2.58 cm2
AV Area VTI: 2.59 cm2
AV Area mean vel: 2.57 cm2
AV Mean grad: 6 mmHg
AV Peak grad: 13.4 mmHg
Ao pk vel: 1.83 m/s
Area-P 1/2: 3.03 cm2
Height: 61 in
MV VTI: 2.65 cm2
S' Lateral: 2.6 cm
Weight: 3171.1 oz

## 2022-04-28 LAB — GLUCOSE, CAPILLARY
Glucose-Capillary: 142 mg/dL — ABNORMAL HIGH (ref 70–99)
Glucose-Capillary: 152 mg/dL — ABNORMAL HIGH (ref 70–99)
Glucose-Capillary: 114 mg/dL — ABNORMAL HIGH (ref 70–99)
Glucose-Capillary: 128 mg/dL — ABNORMAL HIGH (ref 70–99)
Glucose-Capillary: 86 mg/dL (ref 70–99)

## 2022-04-28 LAB — CBG MONITORING, ED: Glucose-Capillary: 148 mg/dL — ABNORMAL HIGH (ref 70–99)

## 2022-04-28 LAB — RESP PANEL BY RT-PCR (RSV, FLU A&B, COVID)  RVPGX2
Influenza A by PCR: NEGATIVE
Influenza B by PCR: NEGATIVE
Resp Syncytial Virus by PCR: NEGATIVE
SARS Coronavirus 2 by RT PCR: NEGATIVE

## 2022-04-28 LAB — TROPONIN I (HIGH SENSITIVITY)
Troponin I (High Sensitivity): 3 ng/L (ref <18)
Troponin I (High Sensitivity): 4 ng/L (ref <18)

## 2022-04-28 LAB — MRSA NEXT GEN BY PCR, NASAL: MRSA by PCR Next Gen: NOT DETECTED

## 2022-04-28 LAB — APTT: aPTT: 30 seconds (ref 24–36)

## 2022-04-28 LAB — STREP PNEUMONIAE URINARY ANTIGEN: Strep Pneumo Urinary Antigen: NEGATIVE

## 2022-04-28 MED ORDER — MAGNESIUM SULFATE 2 GM/50ML IV SOLN
2.0000 g | Freq: Once | INTRAVENOUS | Status: AC
  Administered 2022-04-28: 2 g via INTRAVENOUS
  Filled 2022-04-28: qty 50

## 2022-04-28 MED ORDER — DOCUSATE SODIUM 100 MG PO CAPS: 100.0000 mg | ORAL_CAPSULE | Freq: Two times a day (BID) | ORAL | Status: DC | PRN

## 2022-04-28 MED ORDER — CHLORHEXIDINE GLUCONATE CLOTH 2 % EX PADS
6.0000 | MEDICATED_PAD | Freq: Every day | CUTANEOUS | Status: DC
  Administered 2022-04-28 – 2022-04-29 (×2): 6 via TOPICAL

## 2022-04-28 MED ORDER — LACTATED RINGERS IV BOLUS (SEPSIS)
1000.0000 mL | Freq: Once | INTRAVENOUS | Status: AC
  Administered 2022-04-28: 1000 mL via INTRAVENOUS

## 2022-04-28 MED ORDER — INSULIN ASPART 100 UNIT/ML IJ SOLN
0.0000 [IU] | INTRAMUSCULAR | Status: DC
  Administered 2022-04-28: 2 [IU] via SUBCUTANEOUS
  Administered 2022-04-28: 3 [IU] via SUBCUTANEOUS
  Administered 2022-04-28: 2 [IU] via SUBCUTANEOUS

## 2022-04-28 MED ORDER — HEPARIN SODIUM (PORCINE) 5000 UNIT/ML IJ SOLN
5000.0000 [IU] | Freq: Three times a day (TID) | INTRAMUSCULAR | Status: DC
  Administered 2022-04-28 – 2022-04-29 (×3): 5000 [IU] via SUBCUTANEOUS
  Filled 2022-04-28 (×3): qty 1

## 2022-04-28 MED ORDER — VANCOMYCIN HCL 1500 MG/300ML IV SOLN
1500.0000 mg | Freq: Once | INTRAVENOUS | Status: AC
  Administered 2022-04-28: 1500 mg via INTRAVENOUS
  Filled 2022-04-28: qty 300

## 2022-04-28 MED ORDER — SODIUM CHLORIDE 0.9 % IV SOLN
2.0000 g | Freq: Three times a day (TID) | INTRAVENOUS | Status: DC
  Administered 2022-04-28 – 2022-04-29 (×3): 2 g via INTRAVENOUS
  Filled 2022-04-28 (×3): qty 12.5

## 2022-04-28 MED ORDER — POTASSIUM CHLORIDE CRYS ER 20 MEQ PO TBCR
40.0000 meq | EXTENDED_RELEASE_TABLET | Freq: Once | ORAL | Status: AC
  Administered 2022-04-28: 40 meq via ORAL
  Filled 2022-04-28: qty 2

## 2022-04-28 MED ORDER — VANCOMYCIN HCL IN DEXTROSE 1-5 GM/200ML-% IV SOLN: 1000.0000 mg | Freq: Once | INTRAVENOUS | Status: DC

## 2022-04-28 MED ORDER — NOREPINEPHRINE 4 MG/250ML-% IV SOLN
2.0000 ug/min | INTRAVENOUS | Status: DC
  Administered 2022-04-28: 4 ug/min via INTRAVENOUS
  Filled 2022-04-28: qty 250

## 2022-04-28 MED ORDER — IOHEXOL 350 MG/ML SOLN
75.0000 mL | Freq: Once | INTRAVENOUS | Status: AC | PRN
  Administered 2022-04-28: 75 mL via INTRAVENOUS

## 2022-04-28 MED ORDER — METRONIDAZOLE 500 MG/100ML IV SOLN
500.0000 mg | Freq: Once | INTRAVENOUS | Status: AC
  Administered 2022-04-28: 500 mg via INTRAVENOUS
  Filled 2022-04-28: qty 100

## 2022-04-28 MED ORDER — SODIUM CHLORIDE 0.9 % IV SOLN
2.0000 g | Freq: Once | INTRAVENOUS | Status: AC
  Administered 2022-04-28: 2 g via INTRAVENOUS
  Filled 2022-04-28: qty 12.5

## 2022-04-28 MED ORDER — VANCOMYCIN HCL IN DEXTROSE 1-5 GM/200ML-% IV SOLN: 1000.0000 mg | Freq: Two times a day (BID) | INTRAVENOUS | Status: DC

## 2022-04-28 MED ORDER — LACTATED RINGERS IV SOLN: INTRAVENOUS | Status: AC

## 2022-04-28 MED ORDER — POLYETHYLENE GLYCOL 3350 17 G PO PACK: 17.0000 g | PACK | Freq: Every day | ORAL | Status: DC | PRN

## 2022-04-28 MED ORDER — SODIUM CHLORIDE 0.9 % IV SOLN: 250.0000 mL | INTRAVENOUS | Status: DC

## 2022-04-28 NOTE — ED Notes (Signed)
Returned from CT at this time.

## 2022-04-28 NOTE — ED Notes (Signed)
Informed RN's of heart monitor alerts.

## 2022-04-28 NOTE — Sepsis Progress Note (Signed)
Elink monitoring for the code sepsis protocol.  

## 2022-04-28 NOTE — ED Provider Notes (Signed)
[facility] Provider Note   CSN: [id number] Arrival date & time: 04/28/22  0315     History  Chief Complaint  Patient presents with   Weakness   Hypotension   Level 5 caveat due to acuity of condition Morgan [name] is a 57 y.o. female.  The history is provided by the patient.  Weakness Severity:  Severe Onset quality:  Gradual Timing:  Constant Progression:  Worsening Chronicity:  New Relieved by:  Nothing Worsened by:  Nothing  Patient history of atrial fibrillation, hypertension presents with generalized weakness.  Patient reports before going to bed she noticed her heart rate was low but blood pressure was normal.  She reports she woke up and felt very weak.  EMS was called and they noted she had significant hypotension.  She reports nausea, but no vomiting or diarrhea.  No significant chest pain.  She has some abdominal discomfort.  She had a colonoscopy several days ago, but no complications.  She did hold her anticoagulation, but restarted it 2 nights ago. No recent bloody or dark stools She took her normal BP meds at nighttime without any change.   Past Medical History:  Diagnosis Date   Anxiety    Atrial fibrillation ([place])    Bipolar disorder ([place])    Breast mass    right, removed and benign   Cancer ([place]) 09/16/2019   ovarian ca no radiation no chemo   Cervical dysplasia    Diabetes mellitus without complication (HCC)    Endometriosis    GERD (gastroesophageal reflux disease)    Hypercholesteremia    Hypertension    Migraine    Ovarian cancer ([place])    Pelvic kidney    left   Postpartum depression    hx of    Home Medications Prior to Admission medications   Medication Sig Start Date End Date Taking? Authorizing Provider  ALPRAZOLAM XR 1 MG 24 hr tablet Take 1 mg by mouth daily as needed for anxiety. 05/08/21   [provider]  apixaban (ELIQUIS) 5 MG TABS tablet Take 1 tablet (5 mg total) by  mouth 2 (two) times daily. 03/19/22   Fenton, Clint R, PA  ARIPiprazole (ABILIFY) 15 MG tablet Take 15 mg by mouth at bedtime. 12/21/17   Noemi Chapel, NP  Dapagliflozin Pro-metFORMIN ER (XIGDUO XR) 11-998 MG TB24 Take 1 tablet by mouth at bedtime. 03/25/22   Ronnell Freshwater, NP  diltiazem (CARDIZEM CD) 120 MG 24 hr capsule TAKE 1 CAPSULE (120 MG TOTAL) BY MOUTH DAILY. 04/13/22   Jettie Booze, MD  dilTIAZem HCl Coated Beads (CARTIA XT PO) Take 120 mg by mouth daily at 6 (six) AM. Pt takes 1tablet once a day.    [provider]  DULoxetine (CYMBALTA) 60 MG capsule Take 60 mg by mouth at bedtime. 11/04/16   [provider]  fluticasone (FLONASE) 50 MCG/ACT nasal spray Place 1 spray into both nostrils daily as needed for allergies.    [provider]  Insulin Aspart FlexPen (NOVOLOG) 100 UNIT/ML INJECT 10 UNITS INTO THE SKIN BEFORE BREAKFAST AND LUNCH. INJECT 13 UNITS INTO THE SKIN BEFORE SUPPER. 03/19/22   Ronnell Freshwater, NP  lidocaine (LIDODERM) 5 % Place 1 patch onto the skin daily as needed (pain).    [provider]  losartan (COZAAR) 50 MG tablet Take 1 tablet (50 mg total) by mouth daily. 04/08/22   Jettie Booze, MD  omeprazole ([place] OTC) 20  MG tablet Take 20 mg by mouth at bedtime.    [provider]  ondansetron (ZOFRAN-ODT) 4 MG disintegrating tablet '4mg'$  ODT q4 hours prn nausea/vomit Patient taking differently: Take 4 mg by mouth daily as needed for nausea or vomiting. 09/20/21   Deno Etienne, DO  rosuvastatin (CRESTOR) 10 MG tablet Take 1 tablet (10 mg total) by mouth daily. 04/08/22   Jettie Booze, MD  tiZANidine (ZANAFLEX) 4 MG tablet Take 1 tablet by mouth at bedtime as needed for muscle spasms. 05/14/21   [provider]      Allergies    Clindamycin/lincomycin, Lamictal [lamotrigine], and Lipitor [atorvastatin]    Review of Systems   Review of Systems  Unable to perform ROS: Acuity of condition  Neurological:   Positive for weakness.    Physical Exam Updated Vital Signs BP (!) 128/57 (BP Location: Left Arm)   Pulse 65   Temp 98 F (36.7 C) (Oral)   Resp (!) 29   Ht 1.549 m ('5\' 1"'$ )   Wt 87.3 kg   LMP 05/12/2011   SpO2 98%   BMI 36.37 kg/m  Physical Exam CONSTITUTIONAL: Ill-appearing HEAD: Normocephalic/atraumatic EYES: EOMI/PERRL ENMT: Mucous membranes dry NECK: supple no meningeal signs CV: S1/S2 noted, no murmurs/rubs/gallops noted LUNGS: Lungs are clear to auscultation bilaterally, no apparent distress ABDOMEN: soft, obese, diffuse tenderness NEURO: Pt is awake/alert/appropriate, moves all extremitiesx4.  No facial droop.   EXTREMITIES: pulses normal/equal, full ROM SKIN: Skin is cool to touch PSYCH: Unable to assess ED Results / Procedures / Treatments   Labs (all labs ordered are listed, but only abnormal results are displayed) Labs Reviewed  LACTIC ACID, PLASMA - Abnormal; Notable for the following components:      Result Value   Lactic Acid, Venous 2.9 (*)    All other components within normal limits  COMPREHENSIVE METABOLIC PANEL - Abnormal; Notable for the following components:   Potassium 3.4 (*)    Glucose, Bld 218 (*)    Calcium 8.8 (*)    Total Protein 6.4 (*)    Albumin 3.3 (*)    All other components within normal limits  CBC WITH DIFFERENTIAL/PLATELET - Abnormal; Notable for the following components:   Lymphs Abs 4.2 (*)    All other components within normal limits  PROTIME-INR - Abnormal; Notable for the following components:   Prothrombin Time 15.6 (*)    INR 1.3 (*)    All other components within normal limits  I-STAT CHEM 8, ED - Abnormal; Notable for the following components:   Potassium 3.3 (*)    Glucose, Bld 223 (*)    Calcium, Ion 1.08 (*)    All other components within normal limits  RESP PANEL BY RT-PCR (RSV, FLU A&B, COVID)  RVPGX2  CULTURE, BLOOD (ROUTINE X 2)  CULTURE, BLOOD (ROUTINE X 2)  APTT  LACTIC ACID, PLASMA  URINALYSIS, W/  REFLEX TO CULTURE (INFECTION SUSPECTED)  MAGNESIUM  I-STAT BETA HCG BLOOD, ED ([facility], [facility], [facility] ONLY)  TROPONIN I (HIGH SENSITIVITY)  TROPONIN I (HIGH SENSITIVITY)    EKG EKG Interpretation  Date/Time:  Wednesday April 28 2022 04:50:03 EDT Ventricular Rate:  63 PR Interval:  188 QRS Duration: 89 QT Interval:  416 QTC Calculation: 426 R Axis:   59 Text Interpretation: Sinus rhythm Baseline wander in lead(s) V1 Confirmed by Ripley Fraise [phone]) on 04/28/2022 5:29:06 AM  Radiology CT ABDOMEN PELVIS W CONTRAST  Result Date: 04/28/2022 CLINICAL DATA:  Pulmonary embolism suspected. Dizziness with nausea and  hypertension. EXAM: CT ANGIOGRAPHY CHEST CT ABDOMEN AND PELVIS WITH CONTRAST TECHNIQUE: Multidetector CT imaging of the chest was performed using the standard protocol during bolus administration of intravenous contrast. Multiplanar CT image reconstructions and MIPs were obtained to evaluate the vascular anatomy. Multidetector CT imaging of the abdomen and pelvis was performed using the standard protocol during bolus administration of intravenous contrast. RADIATION DOSE REDUCTION: This exam was performed according to the departmental dose-optimization program which includes automated exposure control, adjustment of the mA and/or kV according to patient size and/or use of iterative reconstruction technique. CONTRAST:  90m OMNIPAQUE IOHEXOL 350 MG/ML SOLN COMPARISON:  03/08/2022 FINDINGS: CTA CHEST FINDINGS Cardiovascular: Satisfactory opacification of the pulmonary arteries to the segmental level. No evidence of pulmonary embolism. Normal heart size. No pericardial effusion. Mediastinum/Nodes: Negative for adenopathy or mass. Lungs/Pleura: Improved right upper lobe aeration when compared to prior. Mild patchy dependent pulmonary opacities attributed atelectasis. Musculoskeletal: Thoracic spondylosis. Review of the MIP images confirms the above findings. CT ABDOMEN and PELVIS FINDINGS Hepatobiliary:  Steatotic appearance of the liver on the chest CTA before there is parenchymal enhancement. 16 mm subcapsular mass in the lateral right liver, stable and consistent with hemangioma given blood pool density on delayed phase.No evidence of biliary obstruction or stone. Pancreas: Unremarkable. Spleen: Unremarkable. Adrenals/Urinary Tract: Negative adrenals. No hydronephrosis or ureteral stone. Left pelvic kidney with 2 punctate calculi. Unremarkable bladder. Stomach/Bowel:  No obstruction. No appendicitis. Vascular/Lymphatic: No acute vascular abnormality. No mass or adenopathy. Reproductive:No pathologic findings. Other: No ascites or pneumoperitoneum. Musculoskeletal: No acute abnormalities. L4-5 solid arthrodesis L2 sclerosis stable since at least 2021 and consistent with benign process. Review of the MIP images confirms the above findings. IMPRESSION: Chest CTA: No acute finding. Nearly resolved pneumonia in the right upper lobe. Abdominal CT: 1. No acute finding. 2. Hepatic steatosis and punctate left renal calculi. Electronically Signed   By: JJorje GuildM.D.   On: 04/28/2022 04:39   CT Angio Chest PE W and/or Wo Contrast  Result Date: 04/28/2022 CLINICAL DATA:  Pulmonary embolism suspected. Dizziness with nausea and hypertension. EXAM: CT ANGIOGRAPHY CHEST CT ABDOMEN AND PELVIS WITH CONTRAST TECHNIQUE: Multidetector CT imaging of the chest was performed using the standard protocol during bolus administration of intravenous contrast. Multiplanar CT image reconstructions and MIPs were obtained to evaluate the vascular anatomy. Multidetector CT imaging of the abdomen and pelvis was performed using the standard protocol during bolus administration of intravenous contrast. RADIATION DOSE REDUCTION: This exam was performed according to the departmental dose-optimization program which includes automated exposure control, adjustment of the mA and/or kV according to patient size and/or use of iterative  reconstruction technique. CONTRAST:  739mOMNIPAQUE IOHEXOL 350 MG/ML SOLN COMPARISON:  03/08/2022 FINDINGS: CTA CHEST FINDINGS Cardiovascular: Satisfactory opacification of the pulmonary arteries to the segmental level. No evidence of pulmonary embolism. Normal heart size. No pericardial effusion. Mediastinum/Nodes: Negative for adenopathy or mass. Lungs/Pleura: Improved right upper lobe aeration when compared to prior. Mild patchy dependent pulmonary opacities attributed atelectasis. Musculoskeletal: Thoracic spondylosis. Review of the MIP images confirms the above findings. CT ABDOMEN and PELVIS FINDINGS Hepatobiliary: Steatotic appearance of the liver on the chest CTA before there is parenchymal enhancement. 16 mm subcapsular mass in the lateral right liver, stable and consistent with hemangioma given blood pool density on delayed phase.No evidence of biliary obstruction or stone. Pancreas: Unremarkable. Spleen: Unremarkable. Adrenals/Urinary Tract: Negative adrenals. No hydronephrosis or ureteral stone. Left pelvic kidney with 2 punctate calculi. Unremarkable bladder. Stomach/Bowel:  No obstruction. No  appendicitis. Vascular/Lymphatic: No acute vascular abnormality. No mass or adenopathy. Reproductive:No pathologic findings. Other: No ascites or pneumoperitoneum. Musculoskeletal: No acute abnormalities. L4-5 solid arthrodesis L2 sclerosis stable since at least 2021 and consistent with benign process. Review of the MIP images confirms the above findings. IMPRESSION: Chest CTA: No acute finding. Nearly resolved pneumonia in the right upper lobe. Abdominal CT: 1. No acute finding. 2. Hepatic steatosis and punctate left renal calculi. Electronically Signed   By: Jorje Guild M.D.   On: 04/28/2022 04:39   DG Chest Port 1 View  Result Date: 04/28/2022 CLINICAL DATA:  Questionable sepsis evaluate for abnormality EXAM: PORTABLE CHEST 1 VIEW COMPARISON:  03/08/2022 FINDINGS: The heart size and mediastinal  contours are within normal limits. Both lungs are clear. The visualized skeletal structures are unremarkable. Cervical spine fusion hardware. IMPRESSION: No active disease. Electronically Signed   By: Placido Sou M.D.   On: 04/28/2022 03:43    Procedures .Critical Care  Performed by: Ripley Fraise, MD Authorized by: Ripley Fraise, MD   Critical care provider statement:    Critical care time (minutes):  88   Critical care start time:  04/28/2022 4:02 AM   Critical care end time:  04/28/2022 5:30 AM   Critical care time was exclusive of:  Separately billable procedures and treating other patients   Critical care was necessary to treat or prevent imminent or life-threatening deterioration of the following conditions:  Sepsis and shock   Critical care was time spent personally by me on the following activities:  Examination of patient, development of treatment plan with patient or surrogate, evaluation of patient's response to treatment, obtaining history from patient or surrogate, re-evaluation of patient's condition, pulse oximetry, ordering and review of radiographic studies, ordering and review of laboratory studies, ordering and performing treatments and interventions, discussions with consultants and review of old charts   I assumed direction of critical care for this patient from another provider in my specialty: no     Care discussed with: admitting provider   Ultrasound ED Echo  Date/Time: 04/28/2022 4:02 AM  Performed by: Ripley Fraise, MD Authorized by: Ripley Fraise, MD   Procedure details:    Indications: hypotension     Views: parasternal long axis view     Images: archived     Limitations:  Body habitus, positioning and patient compliance Findings:    Pericardium: no pericardial effusion   Impression:    Impression: normal       Medications Ordered in ED Medications  lactated ringers infusion (has no administration in time range)  lactated ringers bolus  1,000 mL (0 mLs Intravenous Stopped 04/28/22 0451)    And  lactated ringers bolus 1,000 mL (has no administration in time range)    And  lactated ringers bolus 1,000 mL (0 mLs Intravenous Stopped 04/28/22 0450)  metroNIDAZOLE (FLAGYL) IVPB 500 mg (500 mg Intravenous New Bag/Given 04/28/22 0447)  vancomycin (VANCOREADY) IVPB 1500 mg/300 mL (1,500 mg Intravenous New Bag/Given 04/28/22 0504)  0.9 %  sodium chloride infusion (has no administration in time range)  norepinephrine (LEVOPHED) '4mg'$  in 280m (0.016 mg/mL) premix infusion (2 mcg/min Intravenous Rate/Dose Change 04/28/22 0453)  potassium chloride SA (KLOR-CON M) CR tablet 40 mEq (has no administration in time range)  ceFEPIme (MAXIPIME) 2 g in sodium chloride 0.9 % 100 mL IVPB (2 g Intravenous New Bag/Given 04/28/22 0402)  iohexol (OMNIPAQUE) 350 MG/ML injection 75 mL (75 mLs Intravenous Contrast Given 04/28/22 0430)    ED Course/ Medical Decision  Making/ A&P Clinical Course as of 04/28/22 0530  Wed Apr 28, 2022  0404 Patient seen soon after arrival.  She is ill-appearing.  She is hypotensive unclear etiology.  Heart rate is stable, no heart block, no signs of STEMI.  Bedside ultrasound does not reveal pericardial effusion.  Chest x-ray is unremarkable.  Code medical has been ordered for CT chest and abdomen pelvis.  IV fluids and IV pressors have been ordered [DW]  0407 Patient now bradycardic.  Will place on pacer pads.  Will consult cardiology [DW]  [phone] Blood pressure is improving after Levophed.  Heart rate is now improving.  She is off the CT imaging [DW]  0504 D/w dr Ander Slade with critical care who will evaluate patient [DW]  0530 Patient is improved.  Levophed is being weaned off.  Unclear cause of her instability, but everything is now improving.  Awaiting ICU admission [DW]    Clinical Course User Index [DW] Ripley Fraise, MD                             Medical Decision Making Amount and/or Complexity of Data Reviewed Labs:  ordered. Radiology: ordered. ECG/medicine tests: ordered.  Risk Prescription drug management. Decision regarding hospitalization.   This patient presents to the ED for concern of weakness, this involves an extensive number of treatment options, and is a complaint that carries with it a high risk of complications and morbidity.  The differential diagnosis includes but is not limited to CVA, intracranial hemorrhage, acute coronary syndrome, renal failure, urinary tract infection, electrolyte disturbance, pneumonia   Comorbidities that complicate the patient evaluation: Patient's presentation is complicated by their history of atrial fibrillation   Additional history obtained: Records reviewed previous admission documents  Lab Tests: I Ordered, and personally interpreted labs.  The pertinent results include: hyper glycemia  Imaging Studies ordered: I ordered imaging studies including CT scan chest and abd/pelvis and X-ray chest xray   I independently visualized and interpreted imaging which showed no acute findings I agree with the radiologist interpretation  Cardiac Monitoring: The patient was maintained on a cardiac monitor.  I personally viewed and interpreted the cardiac monitor which showed an underlying rhythm of:  sinus bradycardia  Medicines ordered and prescription drug management: I ordered medication including IV fluids and Levophed for hypotension Reevaluation of the patient after these medicines showed that the patient    improved   Critical Interventions:   IV fluids, Levophed  Consultations Obtained: I requested consultation with the admitting physician critical care and consultant cardiology , and discussed  findings as well as pertinent plan - they recommend: admit to ICU  Reevaluation: After the interventions noted above, I reevaluated the patient and found that they have :improved  Complexity of problems addressed: Patient's presentation is most consistent  with  acute presentation with potential threat to life or bodily function  Disposition: After consideration of the diagnostic results and the patient's response to treatment,  I feel that the patent would benefit from admission   .           Final Clinical Impression(s) / ED Diagnoses Final diagnoses:  Dehydration  Hypotension, unspecified hypotension type  Weakness    Rx / DC Orders ED Discharge Orders     None         Ripley Fraise, MD 04/28/22 [phone]

## 2022-04-28 NOTE — Progress Notes (Signed)
*  PRELIMINARY RESULTS* Echocardiogram 2D Echocardiogram has been performed.  Morgan [name] 04/28/2022, 4:05 PM

## 2022-04-28 NOTE — H&P (Addendum)
NAME:  Morgan [name], MRN:  [id number], DOB:  Sep 23, 1965, LOS: 0 ADMISSION DATE:  04/28/2022 CONSULTATION DATE:  04/28/2022 REFERRING MD:  Christy Gentles - EDP CHIEF COMPLAINT:  Hypotension   History of Present Illness:  57 year old woman who presented to [facility] ED 3/13 for dizziness, nausea, fatigue/malaise and hypotension. PMHx significant for HTN, HLD, Afib (on Eliquis), T2DM, ischemic colitis, GERD, ovarian CA, endometriosis, migraines, anxiety/depression. Recent admission 1/16-1/18 for abdominal pain and blood in stool, c/f ischemic colitis. Seen in ED 1/22, Flu B positive (discharged home on azithromycin).  Patient presented to [facility] ED via EMS for severe hypotension. She reports that she was feeling dizzy and weak at home upon waking and checked her BP; SBP was 60s on home BP cuff. On EMS arrival, she remained A&Ox 4 with no LOC. NS 829m was administered and Epi gtt 162m/minute was administered with improvement in BP to SBP 90s. SpO2 remained 90-96% on 2LNC. CBG was noted to be 281. Of note, patient [facility] a [facility] this 3/11[facility]. She has not had any bloody stools since her scope. Denies fever/chills, CP/SOB, vomiting or diarrhea. Endorses nausea, fatigue/malaise (present since prior admission for Flu B/PNA) and weakness. Denies abdominal pain, changes in urination. She has been drinking well but has had poor appetite for a few days and feels "dry".  Labs in ED notable for WBC 9.8, Hgb 12.5, Plt 214. INR 1.3 (on Eliquis). Na 137, K 3.4, CO2 22, BUN/Cr 8/0.95, glucose 218. LFTs unremarkable. Trop 4. LA 2.9 > 2.5. COVID/Flu/RSV negative. BCx pending and broad-spectrum cefepime/vanc/Flagyl started. CXR unremarkable, CTA Chest with nearly resolved RUL PNA/negative for PE, CT A/P without acute findings (punctate renal calculi, steatosis). While in ED, patient became profoundly bradycardic in addition to hypotensive with HR 30s sustained. Levophed was quickly  titrated up with improvement in HR. Bedside POCUS without e/o pericardial effusion or significant cardiac dysfunction.  PCCM consulted for ICU admission.  Pertinent Medical History:   Past Medical History:  Diagnosis Date   Anxiety    Atrial fibrillation ([facility]   Bipolar disorder ([facility]   Breast mass    right, removed and benign   [place]   ovarian ca no radiation no chemo   Cervical dysplasia    Diabetes mellitus without complication (HCC)    Endometriosis    GERD (gastroesophageal reflux disease)    Hypercholesteremia    Hypertension    Migraine    Ovarian cancer ([place]   Pelvic kidney    left   Postpartum depression    hx of   Significant Hospital Events: Including procedures, antibiotic start and stop dates in addition to other pertinent events   [facility], [facility] SBP 60s on EMS arrival, improved to 90s with Epi gtt. Episode of bradycardia to 30s with hypotension in ED, Levo started. Bedside POCUS negative for pericardial effusion/significant cardiac dysfunction. Broad-spectrum cefepime/vanc/Flagyl started. PCCM consulted for admission .  Interim History / Subjective:  PCCM consulted for ICU admission in the setting of hypotension, pressor needs  Objective:  Blood pressure (!) 128/57, pulse 65, temperature 98 F (36.7 C), temperature source Oral, resp. rate (!) 29, height '5\' 1"'$  (1.549 m), weight 87.3 kg, last menstrual period 05/12/2011, SpO2 98 %.        Intake/Output Summary (Last 24 hours) at 04/28/2022 0510 Last data filed at 04/28/2022 0451 Gross per 24 hour  Intake 1000 ml  Output --  Net 1000 ml   Filed Weights   04/28/22 0322  Weight: 87.3 kg   Physical Examination: General: Acutely ill-appearing middle aged woman in NAD. Sitting up on stretcher, pleasant and conversant. HEENT: [place]/AT, anicteric sclera, PERRL, dry mucous membranes. Neuro: Awake, oriented x 4. Responds to verbal stimuli. Following  commands consistently. Moves all 4 extremities spontaneously. Generalized weakness, 4/5 in all 4 extremities.  CV: RRR, no m/g/r. PULM: Breathing even and unlabored on 2LNC. Lung fields diminished throughout, R > L with occasional rhonchi. GI: Obese, soft, nontender, mildly distended. Slightly hypoactive bowel sounds. Extremities: No LE edema noted. Skin: Warm/dry, no rashes.  Resolved Hospital Problem List:    Assessment & Plan:   Undifferentiated shock, query sepsis? Unknown source Lactic acidosis Recent RUL PNA - 1/22 Flu B+, discharged home from ED on Zithromax - Goal MAP > 65 - Fluid resuscitation as tolerated - Levophed titrated to goal MAP - Trend WBC, fever curve, LA - F/u Cx data (BCx, UA/UCx) - Continue broad-spectrum antibiotics (cefepime, vanc, Flagyl), narrow as able pending Cx data - Follow CXR  Afib Transient bradycardia CHA2DS2-VASc Score 3. On Eliquis at home. - Cardiology consulted by [place] home dilitazem for now in the setting of hypotension and transient bradycardia - Cardiac monitoring - Optimize electrolytes for K > 4, Mg > 2 - Eliquis versus Heparin gtt for AC while in house, pending Cards recs - F/u formal Echo  HTN HLD Home regimen: Losartan, Diltiazem (Afib) - Hold home antihypertensives at present - Continue Crestor - Cardiac monitoring  Ischemic colitis Recent colonoscopy 3/8, no records available in [place]. - Monitor for bloody stools - Antibiotic coverage as above, ?gut translocation possible  T2DM - SSI - CBGs Q4H - Goal CBG 140-180  Anxiety Depression Bipolar disorder - Continue home Abilify, Cymbalta - Xanax PRN at home, hold while hypotensive  Best Practice: (right click and "Reselect all SmartList Selections" daily)   Diet/type: clear liquids DVT prophylaxis: SCDs, DOAC vs. Heparin GI prophylaxis: PPI Lines: N/A Foley:  N/A Code Status:  full code Last date of multidisciplinary goals of care discussion  [Husband/patient updated at bedside 3/13AM, confirm full code status]  Labs:  CBC: Recent Labs  Lab 04/28/22 0329 04/28/22 0355  WBC 9.8  --   NEUTROABS 4.6  --   HGB 12.5 12.6  HCT 38.4 37.0  MCV 83.8  --   PLT 214  --    Basic Metabolic Panel: Recent Labs  Lab 04/28/22 0329 04/28/22 0355  NA 137 138  K 3.4* 3.3*  CL 104 103  CO2 22  --   GLUCOSE 218* 223*  BUN 8 10  CREATININE 0.95 0.90  CALCIUM 8.8*  --    GFR: Estimated Creatinine Clearance: 71.7 mL/min (by C-G formula based on SCr of 0.9 mg/dL). Recent Labs  Lab 04/28/22 0329  WBC 9.8  LATICACIDVEN 2.9*   Liver Function Tests: Recent Labs  Lab 04/28/22 0329  AST 25  ALT 24  ALKPHOS 101  BILITOT 0.5  PROT 6.4*  ALBUMIN 3.3*   No results for input(s): "LIPASE", "AMYLASE" in the last 168 hours. No results for input(s): "AMMONIA" in the last 168 hours.  ABG:    Component Value Date/Time   HCO3 24.6 (H) 02/26/2007 1337   TCO2 23 04/28/2022 0355    Coagulation Profile: Recent Labs  Lab 04/28/22 0329  INR 1.3*   Cardiac Enzymes: No results for input(s): "CKTOTAL", "CKMB", "CKMBINDEX", "TROPONINI" in the last 168  hours.  HbA1C: Hemoglobin A1C  Date/Time Value Ref Range Status  10/15/2021 01:55 PM 7.7 (A) 4.0 - 5.6 % Final  04/07/2021 10:58 AM 7.2 (A) 4.0 - 5.6 % Final   Hgb A1c MFr Bld  Date/Time Value Ref Range Status  03/16/2022 08:36 AM 7.4 (H) 4.8 - 5.6 % Final    Comment:             Prediabetes: 5.7 - 6.4          Diabetes: >6.4          Glycemic control for adults with diabetes: <7.0   07/25/2020 01:06 PM 9.8 (H) 4.8 - 5.6 % Final    Comment:             Prediabetes: 5.7 - 6.4          Diabetes: >6.4          Glycemic control for adults with diabetes: <7.0    CBG: No results for input(s): "GLUCAP" in the last 168 hours.  Review of Systems:   Review of Systems  Constitutional:  Positive for diaphoresis and malaise/fatigue. Negative for chills and fever.  HENT:  Negative  for congestion, sinus pain and sore throat.   Respiratory:  Positive for cough and sputum production. Negative for hemoptysis and shortness of breath.   Cardiovascular:  Negative for chest pain and leg swelling.  Gastrointestinal:  Positive for nausea. Negative for abdominal pain, blood in stool, diarrhea, melena and vomiting.  Genitourinary:  Negative for dysuria, frequency and urgency.  Musculoskeletal:  Negative for myalgias.  Skin:  Negative for itching and rash.  Neurological:  Positive for dizziness and weakness. Negative for headaches.   Past Medical History:  She,  has a past medical history of Anxiety, Atrial fibrillation ([place]), Bipolar disorder ([place]), Breast mass, Cancer ([place]) (09/16/2019), Cervical dysplasia, Diabetes mellitus without complication ([place]), Endometriosis, GERD (gastroesophageal reflux disease), Hypercholesteremia, Hypertension, Migraine, Ovarian cancer ([place]), Pelvic kidney, and Postpartum depression.   Surgical History:   Past Surgical History:  Procedure Laterality Date   ABDOMINAL HYSTERECTOMY N/A    Phreesia 07/30/2019   BACK SURGERY  02/16/2012   BREAST BIOPSY Right 2016   BREAST BIOPSY Right 2020   BREAST EXCISIONAL BIOPSY Right 2021   BREAST EXCISIONAL BIOPSY Right 2016   benign   BREAST LUMPECTOMY WITH RADIOACTIVE SEED LOCALIZATION Right 10/28/2014   Procedure: RIGHT BREAST LUMPECTOMY WITH RADIOACTIVE SEED LOCALIZATION;  Surgeon: [name], MD;  Location: [place];  Service: General;  Laterality: Right;   BREAST LUMPECTOMY WITH RADIOACTIVE SEED LOCALIZATION Right 06/18/2019   Procedure: RIGHT BREAST RADIOACTIVE SEED LOCALIZATION LUMPECTOMY;  Surgeon: Jovita Kussmaul, MD;  Location: [place];  Service: General;  Laterality: Right;   BREAST SURGERY N/A    Phreesia 07/30/2019   CESAREAN SECTION  02/15/2001   COLONOSCOPY WITH PROPOFOL N/A 05/01/2018   Procedure: COLONOSCOPY WITH PROPOFOL;  Surgeon: [name], MD;   Location[facility] ENDOSCOPY;  Service: Gastroenterology;  Laterality: N/A;   COLPOSCOPY     ESOPHAGOGASTRODUODENOSCOPY (EGD) WITH PROPOFOL N/A 05/01/2018   Procedure: ESOPHAGOGASTRODUODENOSCOPY (EGD) WITH PROPOFOL;  Surgeon: [name], MD;  Location: [facility] ENDOSCOPY;  Service: Gastroenterology;  Laterality: N/A;   ESOPHAGOGASTRODUODENOSCOPY (EGD) WITH PROPOFOL N/A 11/13/2018   Procedure: ESOPHAGOGASTRODUODENOSCOPY (EGD) WITH PROPOFOL;  Surgeon: [name], MD;  Location: [facility] ENDOSCOPY;  Service: Gastroenterology;  Laterality: N/A;   GYNECOLOGIC CRYOSURGERY     NECK SURGERY  02/15/2010   PELVIC LAPAROSCOPY  02/15/1989  Social History:   reports that she has never smoked. She has never used smokeless tobacco. She reports that she does not drink alcohol and does not use drugs.   Family History:  Her family history includes Alcohol abuse in her father; Breast cancer (age of onset: 63) in her mother; Depression in her maternal grandmother and mother; Diabetes in her maternal grandmother and mother; Heart attack (age of onset: 66) in her father; Heart disease in her father; Hypertension in her father; Stroke in her father; Throat cancer in her father; Thyroid disease in her mother; Uterine cancer in her maternal grandmother.   Allergies: Allergies  Allergen Reactions   Clindamycin/Lincomycin Rash    Diffuse drug reaction eruption   Lamictal [Lamotrigine] Rash   Lipitor [Atorvastatin] Other (See Comments)    Severe fatigue   Home Medications: Prior to Admission medications   Medication Sig Start Date End Date Taking? Authorizing Provider  ALPRAZOLAM XR 1 MG 24 hr tablet Take 1 mg by mouth daily as needed for anxiety. 05/08/21   [provider]  apixaban (ELIQUIS) 5 MG TABS tablet Take 1 tablet (5 mg total) by mouth 2 (two) times daily. 03/19/22   Fenton, Clint R, PA  ARIPiprazole (ABILIFY) 15 MG tablet Take 15 mg by mouth at bedtime. 12/21/17   Noemi Chapel, NP  Dapagliflozin  Pro-metFORMIN ER (XIGDUO XR) 11-998 MG TB24 Take 1 tablet by mouth at bedtime. 03/25/22   Ronnell Freshwater, NP  diltiazem (CARDIZEM CD) 120 MG 24 hr capsule TAKE 1 CAPSULE (120 MG TOTAL) BY MOUTH DAILY. 04/13/22   Jettie Booze, MD  dilTIAZem HCl Coated Beads (CARTIA XT PO) Take 120 mg by mouth daily at 6 (six) AM. Pt takes 1tablet once a day.    [provider]  DULoxetine (CYMBALTA) 60 MG capsule Take 60 mg by mouth at bedtime. 11/04/16   [provider]  fluticasone (FLONASE) 50 MCG/ACT nasal spray Place 1 spray into both nostrils daily as needed for allergies.    [provider]  Insulin Aspart FlexPen (NOVOLOG) 100 UNIT/ML INJECT 10 UNITS INTO THE SKIN BEFORE BREAKFAST AND LUNCH. INJECT 13 UNITS INTO THE SKIN BEFORE SUPPER. 03/19/22   Ronnell Freshwater, NP  lidocaine (LIDODERM) 5 % Place 1 patch onto the skin daily as needed (pain).    [provider]  losartan (COZAAR) 50 MG tablet Take 1 tablet (50 mg total) by mouth daily. 04/08/22   Jettie Booze, MD  omeprazole (PRILOSEC OTC) 20 MG tablet Take 20 mg by mouth at bedtime.    [provider]  ondansetron (ZOFRAN-ODT) 4 MG disintegrating tablet '4mg'$  ODT q4 hours prn nausea/vomit Patient taking differently: Take 4 mg by mouth daily as needed for nausea or vomiting. 09/20/21   Deno Etienne, DO  rosuvastatin (CRESTOR) 10 MG tablet Take 1 tablet (10 mg total) by mouth daily. 04/08/22   Jettie Booze, MD  tiZANidine (ZANAFLEX) 4 MG tablet Take 1 tablet by mouth at bedtime as needed for muscle spasms. 05/14/21   [provider]    Critical care time: 35 minutes   The patient is critically ill with multiple organ system failure and requires high complexity decision making for assessment and support, frequent evaluation and titration of therapies, advanced monitoring, review of radiographic studies and interpretation of complex data.   Critical Care Time devoted to patient care services,  exclusive of separately billable procedures, described in this note is 35 minutes.  Lestine Mount, PA-C [place]  Pulmonary & Critical Care 04/28/22 5:10 AM  Please see [email or web address] for pager details.  From 7A-7P if no response, please call [phone] After hours, please call ELink [phone]

## 2022-04-28 NOTE — ED Notes (Signed)
ED TO INPATIENT HANDOFF REPORT  ED Nurse Name and Phone #: Alaina RN   S Name/Age/Gender Morgan [name] 57 y.o. female Room/Bed: 018C/018C  Code Status   Code Status: Full Code  Home/SNF/Other Home Patient oriented to: self, place, time, and situation Is this baseline? Yes   Triage Complete: Triage complete  Chief Complaint Hypotension [I95.9]  Triage Note Patient BIB [facility] from home with complaint of dizziness, nausea and hypotension.  AA&O x 4 Denies LOC, vomiting, diarrhea  800 ml NS bolus 23mg/min eppi gtt NSR EMS BP 58/36 - 94/48 RA 90% - 96% 2L Adrian CBG 281 20g L AC   Allergies Allergies  Allergen Reactions   Clindamycin/Lincomycin Rash    Diffuse drug reaction eruption   Lamictal [Lamotrigine] Rash   Lipitor [Atorvastatin] Other (See Comments)    Severe fatigue    Level of Care/Admitting Diagnosis ED Disposition     ED Disposition  Admit   Condition  --   [place] [facility][id number] Level of Care: ICU [6]  May admit patient to [facility] equivalent level of care is available:: No  Covid Evaluation: Confirmed COVID Negative  Diagnosis: Hypotension [[id number] Admitting Physician: OLaurin Coder[[id number] Attending Physician: OLaurin Coder[[id number] Certification:: I certify this patient will need inpatient services for at least 2 midnights  Estimated Length of Stay: 3          B Medical/Surgery History Past Medical History:  Diagnosis Date   Anxiety    Atrial fibrillation ([place]    Bipolar disorder ([place]    Breast mass    right, removed and benign   [place] 09/16/[place]   ovarian ca no radiation no chemo   Cervical dysplasia    Diabetes mellitus without complication ([place]    Endometriosis    GERD (gastroesophageal reflux disease)    Hypercholesteremia    Hypertension    Migraine    Ovarian cancer ([place]    Pelvic kidney    left   Postpartum depression    hx of    Past Surgical History:  Procedure Laterality Date   ABDOMINAL HYSTERECTOMY N/A    Phreesia 07/30/2019   BACK SURGERY  02/16/2012   BREAST BIOPSY Right 2016   BREAST BIOPSY Right 2020   BREAST EXCISIONAL BIOPSY Right 2021   BREAST EXCISIONAL BIOPSY Right 2016   benign   BREAST LUMPECTOMY WITH RADIOACTIVE SEED LOCALIZATION Right 10/28/2014   Procedure: RIGHT BREAST LUMPECTOMY WITH RADIOACTIVE SEED LOCALIZATION;  Surgeon: PAutumn MessingIII, MD;  [place]  Service: General;  Laterality: Right;   BREAST LUMPECTOMY WITH RADIOACTIVE SEED LOCALIZATION Right 06/18/2019   Procedure: RIGHT BREAST RADIOACTIVE SEED LOCALIZATION LUMPECTOMY;  Surgeon: TJovita Kussmaul MD;  [place]  Service: General;  Laterality: Right;   BREAST SURGERY N/A    Phreesia 07/30/2019   CESAREAN SECTION  02/15/2001   COLONOSCOPY WITH PROPOFOL N/A 05/01/2018   Procedure: COLONOSCOPY WITH PROPOFOL;  Surgeon: AJonathon Bellows MD;  [facility]  Service: Gastroenterology;  Laterality: N/A;   COLPOSCOPY     ESOPHAGOGASTRODUODENOSCOPY (EGD) WITH PROPOFOL N/A 05/01/2018   Procedure: ESOPHAGOGASTRODUODENOSCOPY (EGD) WITH PROPOFOL;  Surgeon: AJonathon Bellows MD;  [facility]  Service: Gastroenterology;  Laterality: N/A;   ESOPHAGOGASTRODUODENOSCOPY (EGD) WITH PROPOFOL N/A 11/13/2018   Procedure: ESOPHAGOGASTRODUODENOSCOPY (EGD) WITH PROPOFOL;  Surgeon: AJonathon Bellows MD;  [facility];  Service: Gastroenterology;  Laterality: N/A;   GYNECOLOGIC CRYOSURGERY     NECK SURGERY  02/15/2010   PELVIC LAPAROSCOPY  02/15/1989     A IV Location/Drains/Wounds Patient Lines/Drains/Airways Status     Active Line/Drains/Airways     Name Placement date Placement time Site Days   Peripheral IV 03/02/22 20 G Anterior;Distal;Left;Upper Arm 03/02/22  1632  Arm  57   Peripheral IV 04/28/22 20 G Right Antecubital 04/28/22  0354  Antecubital  less than 1    Peripheral IV 04/28/22 22 G 1.75" Anterior;Right Forearm 04/28/22  0518  Forearm  less than 1            Intake/Output Last 24 hours  Intake/Output Summary (Last 24 hours) at 04/28/2022 [id number] Last data filed at 04/28/2022 [id number] Gross per 24 hour  Intake 2000 ml  Output --  Net 2000 ml    Labs/Imaging Results for orders placed or performed during the hospital encounter of 04/28/22 (from the past 48 hour(s))  Resp panel by RT-PCR (RSV, Flu A&B, Covid) Anterior Nasal Swab     Status: None   Collection Time: 04/28/22  3:29 AM   Specimen: Anterior Nasal Swab  Result Value Ref Range   SARS Coronavirus 2 by RT PCR NEGATIVE NEGATIVE   Influenza A by PCR NEGATIVE NEGATIVE   Influenza B by PCR NEGATIVE NEGATIVE    Comment: (NOTE) The Xpert Xpress SARS-CoV-2/FLU/RSV plus assay is intended as an aid in the diagnosis of influenza from Nasopharyngeal swab specimens and should not be used as a sole basis for treatment. Nasal washings and aspirates are unacceptable for Xpert Xpress SARS-CoV-2/FLU/RSV testing.  Fact Sheet for Patients: EntrepreneurPulse.com.au  Fact Sheet for Healthcare Providers: IncredibleEmployment.be  This test is not yet approved or cleared by the [place] FDA and has been authorized for detection and/or diagnosis of SARS-CoV-2 by FDA under an Emergency Use Authorization (EUA). This EUA will remain in effect (meaning this test can be used) for the duration of the COVID-19 declaration under Section 564(b)(1) of the Act, 21 U.S.C. section 360bbb-3(b)(1), unless the authorization is terminated or revoked.     Resp Syncytial Virus by PCR NEGATIVE NEGATIVE    Comment: (NOTE) Fact Sheet for Patients: EntrepreneurPulse.com.au  Fact Sheet for Healthcare Providers: IncredibleEmployment.be  This test is not yet approved or cleared by the [place] FDA and has been authorized for detection  and/or diagnosis of SARS-CoV-2 by FDA under an Emergency Use Authorization (EUA). This EUA will remain in effect (meaning this test can be used) for the duration of the COVID-19 declaration under Section 564(b)(1) of the Act, 21 U.S.C. section 360bbb-3(b)(1), unless the authorization is terminated or revoked.  Performed at [facility], [place] [address], [place], [place] [address]   Lactic acid, plasma     Status: Abnormal   Collection Time: 04/28/22  3:29 AM  Result Value Ref Range   Lactic Acid, Venous 2.9 (HH) 0.5 - 1.9 mmol/L    Comment: CRITICAL RESULT CALLED TO, READ BACK BY AND VERIFIED WITH B.HUNEYCUTT,RN. [id number] 04/28/22. LPAIT Performed at [facility], [place] [address], [place],  [address]   Comprehensive metabolic panel     Status: Abnormal   Collection Time: 04/28/22  3:29 AM  Result Value Ref Range   Sodium 137 135 - 145 mmol/L   Potassium 3.4 (L) 3.5 - 5.1 mmol/L   Chloride 104 98 - 111 mmol/L   CO2 22 22 - 32 mmol/L   Glucose, Bld 218 (H) 70 -  99 mg/dL    Comment: Glucose reference range applies only to samples taken after fasting for at least 8 hours.   BUN 8 6 - 20 mg/dL   Creatinine, Ser 0.95 0.44 - 1.00 mg/dL   Calcium 8.8 (L) 8.9 - 10.3 mg/dL   Total Protein 6.4 (L) 6.5 - 8.1 g/dL   Albumin 3.3 (L) 3.5 - 5.0 g/dL   AST 25 15 - 41 U/L   ALT 24 0 - 44 U/L   Alkaline Phosphatase 101 38 - 126 U/L   Total Bilirubin 0.5 0.3 - 1.2 mg/dL   GFR, Estimated >60 >60 mL/min    Comment: (NOTE) Calculated using the CKD-EPI Creatinine Equation (2021)    Anion gap 11 5 - 15    Comment: Performed at [facility], [place] [address] [place], [place] [address]  CBC with Differential     Status: Abnormal   Collection Time: 04/28/22  3:29 AM  Result Value Ref Range   WBC 9.8 4.0 - 10.5 K/uL   RBC 4.58 3.87 - 5.11 MIL/uL   Hemoglobin 12.5 12.0 - 15.0 g/dL   HCT 38.4 36.0 - 46.0 %   MCV 83.8 80.0 - 100.0 fL   MCH 27.3 26.0 - 34.0 pg   MCHC 32.6 30.0 -  36.0 g/dL   RDW 14.5 11.5 - 15.5 %   Platelets 214 150 - 400 K/uL   nRBC 0.0 0.0 - 0.2 %   Neutrophils Relative % 47 %   Neutro Abs 4.6 1.7 - 7.7 K/uL   Lymphocytes Relative 43 %   Lymphs Abs 4.2 (H) 0.7 - 4.0 K/uL   Monocytes Relative 5 %   Monocytes Absolute 0.5 0.1 - 1.0 K/uL   Eosinophils Relative 4 %   Eosinophils Absolute 0.4 0.0 - 0.5 K/uL   Basophils Relative 1 %   Basophils Absolute 0.1 0.0 - 0.1 K/uL   Immature Granulocytes 0 %   Abs Immature Granulocytes 0.03 0.00 - 0.07 K/uL    Comment: Performed at [facility] [address], [place], [place] [address]  Protime-INR     Status: Abnormal   Collection Time: 04/28/22  3:29 AM  Result Value Ref Range   Prothrombin Time 15.6 (H) 11.4 - 15.2 seconds   INR 1.3 (H) 0.8 - 1.2    Comment: (NOTE) INR goal varies based on device and disease states. Performed at [facility], [place] [address] [place], [place] [address]   APTT     Status: None   Collection Time: 04/28/22  3:29 AM  Result Value Ref Range   aPTT 30 24 - 36 seconds    Comment: Performed at [facility] [address], [place], [place] [address]  Troponin I (High Sensitivity)     Status: None   Collection Time: 04/28/22  3:29 AM  Result Value Ref Range   Troponin I (High Sensitivity) 3 <18 ng/L    Comment: (NOTE) Elevated high sensitivity troponin I (hsTnI) values and significant  changes across serial measurements may suggest ACS but many other  chronic and acute conditions are known to elevate hsTnI results.  Refer to the "Links" section for chest pain algorithms and additional  guidance. Performed at [facility], [place] [address], [place], [place] [address]   I-Stat beta hCG blood, ED ([facility], [facility], [facility] only)     Status: None   Collection Time: 04/28/22  3:54 AM  Result Value Ref Range  I-stat hCG, quantitative <5.0 <5 mIU/mL   Comment 3            Comment:   GEST. AGE      CONC.  (mIU/mL)   <=1 WEEK        5 - 50     2 WEEKS        50 - 500     3 WEEKS       100 - 10,000     4 WEEKS     1,000 - 30,000        FEMALE AND NON-PREGNANT FEMALE:     LESS THAN 5 mIU/mL   I-Stat Chem 8, ED     Status: Abnormal   Collection Time: 04/28/22  3:55 AM  Result Value Ref Range   Sodium 138 135 - 145 mmol/L   Potassium 3.3 (L) 3.5 - 5.1 mmol/L   Chloride 103 98 - 111 mmol/L   BUN 10 6 - 20 mg/dL   Creatinine, Ser 0.90 0.44 - 1.00 mg/dL   Glucose, Bld 223 (H) 70 - 99 mg/dL    Comment: Glucose reference range applies only to samples taken after fasting for at least 8 hours.   Calcium, Ion 1.08 (L) 1.15 - 1.40 mmol/L   TCO2 23 22 - 32 mmol/L   Hemoglobin 12.6 12.0 - 15.0 g/dL   HCT 37.0 36.0 - 46.0 %  Lactic acid, plasma     Status: Abnormal   Collection Time: 04/28/22  5:15 AM  Result Value Ref Range   Lactic Acid, Venous 2.5 (HH) 0.5 - 1.9 mmol/L    Comment: CRITICAL VALUE NOTED. VALUE IS CONSISTENT WITH PREVIOUSLY REPORTED/CALLED VALUE Performed at [facility], [place] [address] [place], [place] [address]   Troponin I (High Sensitivity)     Status: None   Collection Time: 04/28/22  5:15 AM  Result Value Ref Range   Troponin I (High Sensitivity) 4 <18 ng/L    Comment: (NOTE) Elevated high sensitivity troponin I (hsTnI) values and significant  changes across serial measurements may suggest ACS but many other  chronic and acute conditions are known to elevate hsTnI results.  Refer to the "Links" section for chest pain algorithms and additional  guidance. Performed at [facility], [place] [address] [place], [place] [address]    CT ABDOMEN PELVIS W CONTRAST  Result Date: 04/28/2022 CLINICAL DATA:  Pulmonary embolism suspected. Dizziness with nausea and hypertension. EXAM: CT ANGIOGRAPHY CHEST CT ABDOMEN AND PELVIS WITH CONTRAST TECHNIQUE: Multidetector CT imaging of the chest was performed using the standard protocol during bolus administration of intravenous contrast. Multiplanar CT image reconstructions and  MIPs were obtained to evaluate the vascular anatomy. Multidetector CT imaging of the abdomen and pelvis was performed using the standard protocol during bolus administration of intravenous contrast. RADIATION DOSE REDUCTION: This exam was performed according to the departmental dose-optimization program which includes automated exposure control, adjustment of the mA and/or kV according to patient size and/or use of iterative reconstruction technique. CONTRAST:  81m OMNIPAQUE IOHEXOL 350 MG/ML SOLN COMPARISON:  03/08/2022 FINDINGS: CTA CHEST FINDINGS Cardiovascular: Satisfactory opacification of the pulmonary arteries to the segmental level. No evidence of pulmonary embolism. Normal heart size. No pericardial effusion. Mediastinum/Nodes: Negative for adenopathy or mass. Lungs/Pleura: Improved right upper lobe aeration when compared to prior. Mild patchy dependent pulmonary opacities attributed atelectasis. Musculoskeletal: Thoracic spondylosis. Review of the MIP images confirms the above findings. CT ABDOMEN and PELVIS FINDINGS Hepatobiliary: Steatotic appearance of  the liver on the chest CTA before there is parenchymal enhancement. 16 mm subcapsular mass in the lateral right liver, stable and consistent with hemangioma given blood pool density on delayed phase.No evidence of biliary obstruction or stone. Pancreas: Unremarkable. Spleen: Unremarkable. Adrenals/Urinary Tract: Negative adrenals. No hydronephrosis or ureteral stone. Left pelvic kidney with 2 punctate calculi. Unremarkable bladder. Stomach/Bowel:  No obstruction. No appendicitis. Vascular/Lymphatic: No acute vascular abnormality. No mass or adenopathy. Reproductive:No pathologic findings. Other: No ascites or pneumoperitoneum. Musculoskeletal: No acute abnormalities. L4-5 solid arthrodesis L2 sclerosis stable since at least 2021 and consistent with benign process. Review of the MIP images confirms the above findings. IMPRESSION: Chest CTA: No acute  finding. Nearly resolved pneumonia in the right upper lobe. Abdominal CT: 1. No acute finding. 2. Hepatic steatosis and punctate left renal calculi. Electronically Signed   By: Jorje Guild M.D.   On: 04/28/2022 04:39   CT Angio Chest PE W and/or Wo Contrast  Result Date: 04/28/2022 CLINICAL DATA:  Pulmonary embolism suspected. Dizziness with nausea and hypertension. EXAM: CT ANGIOGRAPHY CHEST CT ABDOMEN AND PELVIS WITH CONTRAST TECHNIQUE: Multidetector CT imaging of the chest was performed using the standard protocol during bolus administration of intravenous contrast. Multiplanar CT image reconstructions and MIPs were obtained to evaluate the vascular anatomy. Multidetector CT imaging of the abdomen and pelvis was performed using the standard protocol during bolus administration of intravenous contrast. RADIATION DOSE REDUCTION: This exam was performed according to the departmental dose-optimization program which includes automated exposure control, adjustment of the mA and/or kV according to patient size and/or use of iterative reconstruction technique. CONTRAST:  46m OMNIPAQUE IOHEXOL 350 MG/ML SOLN COMPARISON:  03/08/2022 FINDINGS: CTA CHEST FINDINGS Cardiovascular: Satisfactory opacification of the pulmonary arteries to the segmental level. No evidence of pulmonary embolism. Normal heart size. No pericardial effusion. Mediastinum/Nodes: Negative for adenopathy or mass. Lungs/Pleura: Improved right upper lobe aeration when compared to prior. Mild patchy dependent pulmonary opacities attributed atelectasis. Musculoskeletal: Thoracic spondylosis. Review of the MIP images confirms the above findings. CT ABDOMEN and PELVIS FINDINGS Hepatobiliary: Steatotic appearance of the liver on the chest CTA before there is parenchymal enhancement. 16 mm subcapsular mass in the lateral right liver, stable and consistent with hemangioma given blood pool density on delayed phase.No evidence of biliary obstruction or  stone. Pancreas: Unremarkable. Spleen: Unremarkable. Adrenals/Urinary Tract: Negative adrenals. No hydronephrosis or ureteral stone. Left pelvic kidney with 2 punctate calculi. Unremarkable bladder. Stomach/Bowel:  No obstruction. No appendicitis. Vascular/Lymphatic: No acute vascular abnormality. No mass or adenopathy. Reproductive:No pathologic findings. Other: No ascites or pneumoperitoneum. Musculoskeletal: No acute abnormalities. L4-5 solid arthrodesis L2 sclerosis stable since at least 2021 and consistent with benign process. Review of the MIP images confirms the above findings. IMPRESSION: Chest CTA: No acute finding. Nearly resolved pneumonia in the right upper lobe. Abdominal CT: 1. No acute finding. 2. Hepatic steatosis and punctate left renal calculi. Electronically Signed   By: JJorje GuildM.D.   On: 04/28/2022 04:39   DG Chest Port 1 View  Result Date: 04/28/2022 CLINICAL DATA:  Questionable sepsis evaluate for abnormality EXAM: PORTABLE CHEST 1 VIEW COMPARISON:  03/08/2022 FINDINGS: The heart size and mediastinal contours are within normal limits. Both lungs are clear. The visualized skeletal structures are unremarkable. Cervical spine fusion hardware. IMPRESSION: No active disease. Electronically Signed   By: TPlacido SouM.D.   On: 04/28/2022 03:43    Pending Labs [employer](From admission, onward)     Start     Ordered  04/29/22 0500  CBC  Tomorrow morning,   R       Question:  Specimen collection method  Answer:  IV Team=IV Team collect   04/28/22 0559   04/29/22 [id number]  Basic metabolic panel  Tomorrow morning,   R       Question:  Specimen collection method  Answer:  IV Team=IV Team collect   04/28/22 0559   04/29/22 0500  Magnesium  Tomorrow morning,   R       Question:  Specimen collection method  Answer:  IV Team=IV Team collect   04/28/22 0559   04/29/22 0500  Phosphorus  Tomorrow morning,   R       Question:  Specimen collection method  Answer:  IV Team=IV Team  collect   04/28/22 0559   04/28/22 0900  Lactic acid, plasma  STAT Now then every 3 hours,   R (with STAT occurrences)     Question:  Specimen collection method  Answer:  IV Team=IV Team collect   04/28/22 0559   04/28/22 0645  Magnesium  Once,   R        04/28/22 0645   04/28/22 0600  Strep pneumoniae urinary antigen (not at [facility])  Once,   R        04/28/22 0559   04/28/22 0600  Legionella Pneumophila Serogp 1 Ur Ag  Once,   R        04/28/22 0559   04/28/22 0557  HIV Antibody (routine testing w rflx)  (HIV Antibody (Routine testing w reflex) panel)  Once,   R       Question:  Specimen collection method  Answer:  IV Team=IV Team collect   04/28/22 0559   04/28/22 0331  Blood Culture (routine x 2)  (Septic presentation on arrival (screening labs, nursing and treatment orders for obvious sepsis))  BLOOD CULTURE X 2,   STAT      04/28/22 0331   04/28/22 0331  Urinalysis, w/ Reflex to Culture (Infection Suspected) -Urine, Catheterized  (Septic presentation on arrival (screening labs, nursing and treatment orders for obvious sepsis))  Once,   URGENT       Question:  Specimen Source  Answer:  Urine, Catheterized   04/28/22 0331            Vitals/Pain Today's Vitals   04/28/22 0634 04/28/22 0638 04/28/22 0640 04/28/22 0642  BP: (!) 106/51 (!) 109/50 (!) 108/47 (!) 111/53  Pulse: (!) 59 73 63 64  Resp: 16 (!) 22 (!) 27 17  Temp:      TempSrc:      SpO2: 100% 100% 100% 100%  Weight:      Height:      PainSc:        Isolation Precautions No active isolations  Medications Medications  lactated ringers infusion ( Intravenous New Bag/Given 04/28/22 0608)  0.9 %  sodium chloride infusion (has no administration in time range)  norepinephrine (LEVOPHED) '4mg'$  in 219m (0.016 mg/mL) premix infusion (2 mcg/min Intravenous Rate/Dose Change 04/28/22 0453)  docusate sodium (COLACE) capsule 100 mg (has no administration in time range)  polyethylene glycol (MIRALAX / GLYCOLAX) packet 17 g (has  no administration in time range)  heparin injection 5,000 Units (has no administration in time range)  insulin aspart (novoLOG) injection 0-15 Units (has no administration in time range)  vancomycin (VANCOCIN) IVPB 1000 mg/200 mL premix (has no administration in time range)  ceFEPIme (MAXIPIME) 2 g in sodium chloride 0.9 % 100  mL IVPB (has no administration in time range)  lactated ringers bolus 1,000 mL (0 mLs Intravenous Stopped 04/28/22 0451)    And  lactated ringers bolus 1,000 mL (0 mLs Intravenous Stopped 04/28/22 0722)    And  lactated ringers bolus 1,000 mL (0 mLs Intravenous Stopped 04/28/22 0450)  ceFEPIme (MAXIPIME) 2 g in sodium chloride 0.9 % 100 mL IVPB (0 g Intravenous Stopped 04/28/22 0605)  metroNIDAZOLE (FLAGYL) IVPB 500 mg (0 mg Intravenous Stopped 04/28/22 0605)  vancomycin (VANCOREADY) IVPB 1500 mg/300 mL (1,500 mg Intravenous New Bag/Given 04/28/22 0504)  iohexol (OMNIPAQUE) 350 MG/ML injection 75 mL (75 mLs Intravenous Contrast Given 04/28/22 0430)  potassium chloride SA (KLOR-CON M) CR tablet 40 mEq (40 mEq Oral Given 04/28/22 0643)    Mobility walks     Focused Assessments Cardiac Assessment Handoff:    Lab Results  Component Value Date   TROPONINI <0.03 06/12/2018   Lab Results  Component Value Date   DDIMER <0.27 06/14/2017   Does the Patient currently have chest pain? No    R Recommendations: See Admitting Provider Note  Report given to:   Additional Notes:

## 2022-04-28 NOTE — ED Notes (Signed)
IV team in room to obtain additional IV access

## 2022-04-28 NOTE — Progress Notes (Signed)
Pharmacy Antibiotic Note  Morgan [name] is a 57 y.o. female admitted on 04/28/2022 with hypotension and possible sepsis.  Pharmacy has been consulted for Vancomycin and Cefepime dosing.  Vancomycin 1500 mg IV given in ED at  0500  Plan: Vancomycin 1000 mg IV q12h Cefpime 2 g IV q8h  Height: '5\' 1"'$  (154.9 cm) Weight: 87.3 kg (192 lb 7.4 oz) IBW/kg (Calculated) : 47.8  Temp (24hrs), Avg:98 F (36.7 C), Min:98 F (36.7 C), Max:98 F (36.7 C)  Recent Labs  Lab 04/28/22 0329 04/28/22 0355 04/28/22 0515  WBC 9.8  --   --   CREATININE 0.95 0.90  --   LATICACIDVEN 2.9*  --  2.5*    Estimated Creatinine Clearance: 71.7 mL/min (by C-G formula based on SCr of 0.9 mg/dL).    Allergies  Allergen Reactions   Clindamycin/Lincomycin Rash    Diffuse drug reaction eruption   Lamictal [Lamotrigine] Rash   Lipitor [Atorvastatin] Other (See Comments)    Severe fatigue     Caryl Pina 04/28/2022 6:27 AM

## 2022-04-28 NOTE — ED Notes (Signed)
CRITICAL VALUE STICKER  CRITICAL VALUE: lactic 2.9  RECEIVER (on-site recipient of call):Shalea Tomczak Vue Pavon, RN   DATE & TIME NOTIFIED: 0413 04/28/22  MD NOTIFIED: Dr.Wickline  TIME OF NOTIFICATION: [id number]

## 2022-04-28 NOTE — ED Notes (Signed)
Patient placed on pads

## 2022-04-28 NOTE — Progress Notes (Signed)
NURSING PROGRESS NOTE  Morgan [name] [id number] Transfer Data: 04/28/2022 9:16 AM Attending Provider: Laurin Coder, MD [id number], [place], PA-C (Inactive) Code Status: FULL  Morgan [name] is a 57 y.o. female patient transferred from ED to 4M room 9 -No acute distress noted.  -No complaints of shortness of breath.  -No complaints of chest pain.   Cardiac Monitoring: Box # 4M-09 in place. Cardiac monitor yields:normal sinus rhythm.  Last Documented Vital Signs: Blood pressure (!) 103/32, pulse 64, temperature 97.6 F (36.4 C), temperature source Oral, resp. rate 16, height '5\' 1"'$  (1.549 m), weight 89.9 kg, last menstrual period 05/12/2011, SpO2 99 %.  IV Fluids:  IV in place, occlusive dsg intact without redness, IV cath antecubital right, condition patent and no redness    Skin: clean dry and intact no skin tear noted.  Patient/Family orientated to room. Information packet given to patient/family. Admission inpatient armband information verified with patient/family to include name and date of birth and placed on patient arm. Side rails up x 2, fall assessment and education completed with patient/family. Patient/family able to verbalize understanding of risk associated with falls and verbalized understanding to call for assistance before getting out of bed. Call light within reach. Patient/family able to voice and demonstrate understanding of unit orientation instructions.

## 2022-04-28 NOTE — ED Triage Notes (Signed)
Patient BIB [facility] from home with complaint of dizziness, nausea and hypotension.  AA&O x 4 Denies LOC, vomiting, diarrhea  800 ml NS bolus 6mg/min eppi gtt NSR EMS BP 58/36 - 94/48 RA 90% - 96% 2L [place] CBG 281 20g L AC

## 2022-04-28 NOTE — Progress Notes (Signed)
An USGPIV (ultrasound guided PIV) has been placed for short-term vasopressor infusion. A correctly placed ivWatch must be used when administering Vasopressors. Should this treatment be needed beyond 72 hours, central line access should be obtained.  It will be the responsibility of the bedside nurse to follow best practice to prevent extravasations.   ?

## 2022-04-29 DIAGNOSIS — I952 Hypotension due to drugs: Secondary | ICD-10-CM | POA: Diagnosis not present

## 2022-04-29 DIAGNOSIS — T50905A Adverse effect of unspecified drugs, medicaments and biological substances, initial encounter: Secondary | ICD-10-CM

## 2022-04-29 DIAGNOSIS — I959 Hypotension, unspecified: Secondary | ICD-10-CM | POA: Diagnosis present

## 2022-04-29 DIAGNOSIS — L8990 Pressure ulcer of unspecified site, unspecified stage: Secondary | ICD-10-CM | POA: Insufficient documentation

## 2022-04-29 DIAGNOSIS — R001 Bradycardia, unspecified: Secondary | ICD-10-CM

## 2022-04-29 DIAGNOSIS — R579 Shock, unspecified: Secondary | ICD-10-CM

## 2022-04-29 LAB — CBC
HCT: 37.5 % (ref 36.0–46.0)
Hemoglobin: 12.3 g/dL (ref 12.0–15.0)
MCH: 26.8 pg (ref 26.0–34.0)
MCHC: 32.8 g/dL (ref 30.0–36.0)
MCV: 81.7 fL (ref 80.0–100.0)
Platelets: 163 10*3/uL (ref 150–400)
RBC: 4.59 MIL/uL (ref 3.87–5.11)
RDW: 14.5 % (ref 11.5–15.5)
WBC: 4.1 10*3/uL (ref 4.0–10.5)
nRBC: 0 % (ref 0.0–0.2)

## 2022-04-29 LAB — HIV ANTIBODY (ROUTINE TESTING W REFLEX): HIV Screen 4th Generation wRfx: NONREACTIVE

## 2022-04-29 LAB — GLUCOSE, CAPILLARY
Glucose-Capillary: 107 mg/dL — ABNORMAL HIGH (ref 70–99)
Glucose-Capillary: 102 mg/dL — ABNORMAL HIGH (ref 70–99)
Glucose-Capillary: 107 mg/dL — ABNORMAL HIGH (ref 70–99)

## 2022-04-29 LAB — BASIC METABOLIC PANEL
Anion gap: 8 (ref 5–15)
BUN: 6 mg/dL (ref 6–20)
CO2: 21 mmol/L — ABNORMAL LOW (ref 22–32)
Calcium: 8.9 mg/dL (ref 8.9–10.3)
Chloride: 111 mmol/L (ref 98–111)
Creatinine, Ser: 0.63 mg/dL (ref 0.44–1.00)
GFR, Estimated: >60 mL/min (ref >60)
Glucose, Bld: 119 mg/dL — ABNORMAL HIGH (ref 70–99)
Potassium: 4.1 mmol/L (ref 3.5–5.1)
Sodium: 140 mmol/L (ref 135–145)

## 2022-04-29 LAB — PHOSPHORUS: Phosphorus: 2.5 mg/dL (ref 2.5–4.6)

## 2022-04-29 LAB — LEGIONELLA PNEUMOPHILA SEROGP 1 UR AG: L. pneumophila Serogp 1 Ur Ag: NEGATIVE

## 2022-04-29 LAB — MAGNESIUM: Magnesium: 1.9 mg/dL (ref 1.7–2.4)

## 2022-04-29 MED ORDER — OMEPRAZOLE MAGNESIUM 20 MG PO TBEC: 20.0000 mg | DELAYED_RELEASE_TABLET | Freq: Every day | ORAL | Status: DC

## 2022-04-29 MED ORDER — ARIPIPRAZOLE 5 MG PO TABS
15.0000 mg | ORAL_TABLET | Freq: Every day | ORAL | Status: DC
  Filled 2022-04-29: qty 1

## 2022-04-29 MED ORDER — INSULIN ASPART 100 UNIT/ML IJ SOLN: 0.0000 [IU] | Freq: Three times a day (TID) | INTRAMUSCULAR | Status: DC

## 2022-04-29 MED ORDER — DULOXETINE HCL 60 MG PO CPEP: 60.0000 mg | ORAL_CAPSULE | Freq: Every day | ORAL | Status: DC

## 2022-04-29 MED ORDER — ROSUVASTATIN CALCIUM 20 MG PO TABS
10.0000 mg | ORAL_TABLET | Freq: Every day | ORAL | Status: DC
  Administered 2022-04-29: 10 mg via ORAL
  Filled 2022-04-29: qty 1

## 2022-04-29 MED ORDER — TIZANIDINE HCL 4 MG PO TABS: 4.0000 mg | ORAL_TABLET | Freq: Every evening | ORAL | Status: DC | PRN

## 2022-04-29 MED ORDER — PANTOPRAZOLE SODIUM 40 MG PO TBEC
40.0000 mg | DELAYED_RELEASE_TABLET | Freq: Every day | ORAL | Status: DC
  Administered 2022-04-29: 40 mg via ORAL
  Filled 2022-04-29: qty 1

## 2022-04-29 MED ORDER — APIXABAN 5 MG PO TABS
5.0000 mg | ORAL_TABLET | Freq: Two times a day (BID) | ORAL | Status: DC
  Administered 2022-04-29: 5 mg via ORAL
  Filled 2022-04-29: qty 1

## 2022-04-29 NOTE — Care Management CC44 (Signed)
Condition Code 44 Documentation Completed  Patient Details  Name: EULALEE SHINDLER MRN: [id number] Date of Birth: 07-30-1965   Condition Code 44 given:  Yes Patient signature on Condition Code 44 notice:  Yes Documentation of 2 MD's agreement:  Yes Code 44 added to claim:  Yes    Tom-Johnson, Renea Ee, RN 04/29/2022, 3:20 PM

## 2022-04-29 NOTE — Discharge Summary (Signed)
Physician Discharge Summary       Patient ID: Morgan [name] MRN: [id number] DOB/AGE: 08/16/1965 57 y.o.  Admit date: 04/28/2022 Discharge date: 04/29/2022  Discharge Diagnoses:  Active Problems:   * No active hospital problems. *   History of Present Illness: 57 year old woman who presented to [facility] ED 3/13 for dizziness, nausea, fatigue/malaise and hypotension. PMHx significant for HTN, HLD, Afib (on Eliquis), T2DM, ischemic colitis, GERD, ovarian CA, endometriosis, migraines, anxiety/depression. Recent admission 1/16-1/18 for abdominal pain and blood in stool, c/f ischemic colitis. Seen in ED 1/22, Flu B positive (discharged home on azithromycin).   Patient presented to [facility] ED via EMS for severe hypotension. She reports that she was feeling dizzy and weak at home upon waking and checked her BP; SBP was 60s on home BP cuff. On EMS arrival, she remained A&Ox 4 with no LOC. NS 827m was administered and Epi gtt 128m/minute was administered with improvement in BP to SBP 90s. SpO2 remained 90-96% on 2LNC. CBG was noted to be 281. Of note, patient had a recent colonoscopy 3/8 and [facility] but resumed this 3/11PM. She has not had any bloody stools since her scope. Denies fever/chills, CP/SOB, vomiting or diarrhea. Endorses nausea, fatigue/malaise (present since prior admission for Flu B/PNA) and weakness. Denies abdominal pain, changes in urination. She has been drinking well but has had poor appetite for a few days and feels "dry".   Labs in ED notable for WBC 9.8, Hgb 12.5, Plt 214. INR 1.3 (on Eliquis). Na 137, K 3.4, CO2 22, BUN/Cr 8/0.95, glucose 218. LFTs unremarkable. Trop 4. LA 2.9 > 2.5. COVID/Flu/RSV negative. BCx pending and broad-spectrum cefepime/vanc/Flagyl started. CXR unremarkable, CTA Chest with nearly resolved RUL PNA/negative for PE, CT A/P without acute findings (punctate renal calculi, steatosis). While in ED, patient became profoundly bradycardic in addition to  hypotensive with HR 30s sustained. Levophed was quickly titrated up with improvement in HR. Bedside POCUS without e/o pericardial effusion or significant cardiac dysfunction.   Admitted to ICU  Hospital Course:   In the ICU she was treated for shock with IV fluid resuscitation and low-dose norepinephrine infusion.  Etiology of the shock was not identified and she improved quickly with the aforementioned therapies.  Workup not consistent with sepsis or hypovolemia. CT chest/abd/pelvis negative. No evidence of significant bleeding.  Echocardiogram did not describe findings consistent with cardiogenic etiology.  She quickly weaned off norepinephrine. Able to walk around the unit without any issue.  In discussion with our pharmacist colleagues we learned that she had a long break without filling her medications and had just recently had them filled.  The patient confirmed that she had not been taking her medications as prescribed up until recently due to financial imitations.  Given this information and lack of other physiologic explanation for bradycardia and hypotension we suspect that the most likely cause are medication effects.     Discharge Plan by active problems   Hypotension Bradycardia - Holding diltiazem, losartan - Cardiology follow up arranged 3/19 - Patient instructed to check BP and pulse at home. Come back to ED for any additional hypotension or bradycardia.  Atrial fibrillation - stop dilt pending card f/u - continue Eliquis  HLD - continue crestor  DM2 - no change to outpatient regimen  Anxiety Depression Bipolar - no change to outpatient regimen    Significant Hospital tests/ studies   Chest CTA:   No acute finding. Nearly resolved pneumonia in the right upper lobe.  Abdominal CT:   1. No acute finding. 2. Hepatic steatosis and punctate left renal calculi.  Consults    Discharge Exam: BP 130/74   Pulse 81   Temp 97.9 F (36.6 C) (Oral)   Resp 19    Ht '5\' 1"'$  (1.549 m)   Wt 86.8 kg   LMP 05/12/2011   SpO2 95%   BMI 36.16 kg/m   Physical Examination: General:  overweight middle aged female in NAD Neuro:  Alert, oriented, non-focal HEENT:  [place]/AT, PERRL, no JVD Cardiovascular:  RRR, no MRG. No edema.  Lungs:  Clear bilateral breath sounds.  Abdomen:  Soft, non-distended, mild tenderness RUQ and RLQ. Unchanged x weeks.  Musculoskeletal:  No acute deformity or ROM limitation. Skin:  Intact, MMM    Labs at discharge Lab Results  Component Value Date   CREATININE 0.63 04/29/2022   BUN 6 04/29/2022   NA 140 04/29/2022   K 4.1 04/29/2022   CL 111 04/29/2022   CO2 21 (L) 04/29/2022   Lab Results  Component Value Date   WBC 4.1 04/29/2022   HGB 12.3 04/29/2022   HCT 37.5 04/29/2022   MCV 81.7 04/29/2022   PLT 163 04/29/2022   Lab Results  Component Value Date   ALT 24 04/28/2022   AST 25 04/28/2022   ALKPHOS 101 04/28/2022   BILITOT 0.5 04/28/2022   Lab Results  Component Value Date   INR 1.3 (H) 04/28/2022   INR 1.0 03/02/2022   INR 0.9 01/06/2022    Current radiology studies ECHOCARDIOGRAM COMPLETE  Result Date: 04/28/2022    ECHOCARDIOGRAM REPORT   Patient Name:   Morgan [name] Date of Exam: 04/28/2022 Medical Rec #:  [id number]        Height:       61.0 in Accession #:    [id number]       Weight:       198.2 lb Date of Birth:  [birth year]        BSA:          1.881 m Patient Age:    57 years         BP:           137/62 mmHg Patient Gender: F                HR:           70 bpm. Exam Location:  Inpatient Procedure: 2D Echo, Cardiac Doppler and Color Doppler Indications:    Abnormal ECG  History:        Patient has prior history of Echocardiogram examinations, most                 recent 08/06/2020. Abnormal ECG, Arrythmias:Atrial Fibrillation,                 Signs/Symptoms:Fatigue; Risk Factors:Hypertension, Diabetes and                 Dyslipidemia.  Sonographer:    Wenda Low Referring Phys: [place]  1. Left ventricular ejection fraction, by estimation, is 60 to 65%. The left ventricle has normal function. The left ventricle has no regional wall motion abnormalities. There is mild left ventricular hypertrophy. Left ventricular diastolic parameters were normal.  2. Right ventricular systolic function is normal. The right ventricular size is normal. There is normal pulmonary artery systolic pressure. The estimated right ventricular systolic pressure is [id number] mmHg.  3. The mitral valve is normal in  structure. Trivial mitral valve regurgitation. No evidence of mitral stenosis.  4. The aortic valve is tricuspid. Aortic valve regurgitation is not visualized. No aortic stenosis is present.  5. The inferior vena cava is normal in size with greater than 50% respiratory variability, suggesting right atrial pressure of 3 mmHg. FINDINGS  Left Ventricle: Left ventricular ejection fraction, by estimation, is 60 to 65%. The left ventricle has normal function. The left ventricle has no regional wall motion abnormalities. The left ventricular internal cavity size was normal in size. There is  mild left ventricular hypertrophy. Left ventricular diastolic parameters were normal. Right Ventricle: The right ventricular size is normal. No increase in right ventricular wall thickness. Right ventricular systolic function is normal. There is normal pulmonary artery systolic pressure. The tricuspid regurgitant velocity is 2.37 m/s, and  with an assumed right atrial pressure of 3 mmHg, the estimated right ventricular systolic pressure is [id number] mmHg. Left Atrium: Left atrial size was normal in size. Right Atrium: Right atrial size was normal in size. Pericardium: There is no evidence of pericardial effusion. Mitral Valve: The mitral valve is normal in structure. Trivial mitral valve regurgitation. No evidence of mitral valve stenosis. MV peak gradient, 5.6 mmHg. The mean mitral valve gradient is 2.0 mmHg.  Tricuspid Valve: The tricuspid valve is normal in structure. Tricuspid valve regurgitation is trivial. No evidence of tricuspid stenosis. Aortic Valve: The aortic valve is tricuspid. Aortic valve regurgitation is not visualized. No aortic stenosis is present. Aortic valve mean gradient measures 6.0 mmHg. Aortic valve peak gradient measures 13.4 mmHg. Aortic valve area, by VTI measures 2.59  cm. Pulmonic Valve: The pulmonic valve was normal in structure. Pulmonic valve regurgitation is trivial. No evidence of pulmonic stenosis. Aorta: The aortic root is normal in size and structure. Venous: The inferior vena cava is normal in size with greater than 50% respiratory variability, suggesting right atrial pressure of 3 mmHg. IAS/Shunts: No atrial level shunt detected by color flow Doppler.  LEFT VENTRICLE PLAX 2D LVIDd:         4.10 cm   Diastology LVIDs:         2.60 cm   LV e' medial:    7.83 cm/s LV PW:         1.20 cm   LV E/e' medial:  11.9 LV IVS:        1.10 cm   LV e' lateral:   9.25 cm/s LVOT diam:     2.00 cm   LV E/e' lateral: 10.0 LV SV:         96 LV SV Index:   51 LVOT Area:     3.14 cm  RIGHT VENTRICLE RV Basal diam:  2.65 cm RV Mid diam:    3.00 cm RV S prime:     14.90 cm/s TAPSE (M-mode): 2.5 cm LEFT ATRIUM             Index        RIGHT ATRIUM           Index LA diam:        3.50 cm 1.86 cm/m   RA Area:     12.30 cm LA Vol (A2C):   39.9 ml 21.21 ml/m  RA Volume:   23.30 ml  12.38 ml/m LA Vol (A4C):   49.5 ml 26.31 ml/m LA Biplane Vol: 47.1 ml 25.03 ml/m  AORTIC VALVE  PULMONIC VALVE AV Area (Vmax):    2.58 cm      PV Vmax:       1.25 m/s AV Area (Vmean):   2.57 cm      PV Peak grad:  6.2 mmHg AV Area (VTI):     2.59 cm AV Vmax:           183.00 cm/s AV Vmean:          115.000 cm/s AV VTI:            0.373 m AV Peak Grad:      13.4 mmHg AV Mean Grad:      6.0 mmHg LVOT Vmax:         150.00 cm/s LVOT Vmean:        93.900 cm/s LVOT VTI:          0.307 m LVOT/AV VTI ratio: 0.82   AORTA Ao Root diam: 2.60 cm MITRAL VALVE                TRICUSPID VALVE MV Area (PHT): 3.03 cm     TR Peak grad:   22.5 mmHg MV Area VTI:   2.65 cm     TR Vmax:        237.00 cm/s MV Peak grad:  5.6 mmHg MV Mean grad:  2.0 mmHg     SHUNTS MV Vmax:       1.18 m/s     Systemic VTI:  0.31 m MV Vmean:      70.2 cm/s    Systemic Diam: 2.00 cm MV Decel Time: 250 msec MV E velocity: 92.90 cm/s MV A velocity: 102.00 cm/s MV E/A ratio:  0.91 Cherlynn Kaiser MD Electronically signed by Cherlynn Kaiser MD Signature Date/Time: 04/28/2022/9:27:00 PM    Final    CT ABDOMEN PELVIS W CONTRAST  Result Date: 04/28/2022 CLINICAL DATA:  Pulmonary embolism suspected. Dizziness with nausea and hypertension. EXAM: CT ANGIOGRAPHY CHEST CT ABDOMEN AND PELVIS WITH CONTRAST TECHNIQUE: Multidetector CT imaging of the chest was performed using the standard protocol during bolus administration of intravenous contrast. Multiplanar CT image reconstructions and MIPs were obtained to evaluate the vascular anatomy. Multidetector CT imaging of the abdomen and pelvis was performed using the standard protocol during bolus administration of intravenous contrast. RADIATION DOSE REDUCTION: This exam was performed according to the departmental dose-optimization program which includes automated exposure control, adjustment of the mA and/or kV according to patient size and/or use of iterative reconstruction technique. CONTRAST:  48m OMNIPAQUE IOHEXOL 350 MG/ML SOLN COMPARISON:  03/08/2022 FINDINGS: CTA CHEST FINDINGS Cardiovascular: Satisfactory opacification of the pulmonary arteries to the segmental level. No evidence of pulmonary embolism. Normal heart size. No pericardial effusion. Mediastinum/Nodes: Negative for adenopathy or mass. Lungs/Pleura: Improved right upper lobe aeration when compared to prior. Mild patchy dependent pulmonary opacities attributed atelectasis. Musculoskeletal: Thoracic spondylosis. Review of the MIP images confirms the  above findings. CT ABDOMEN and PELVIS FINDINGS Hepatobiliary: Steatotic appearance of the liver on the chest CTA before there is parenchymal enhancement. 16 mm subcapsular mass in the lateral right liver, stable and consistent with hemangioma given blood pool density on delayed phase.No evidence of biliary obstruction or stone. Pancreas: Unremarkable. Spleen: Unremarkable. Adrenals/Urinary Tract: Negative adrenals. No hydronephrosis or ureteral stone. Left pelvic kidney with 2 punctate calculi. Unremarkable bladder. Stomach/Bowel:  No obstruction. No appendicitis. Vascular/Lymphatic: No acute vascular abnormality. No mass or adenopathy. Reproductive:No pathologic findings. Other: No ascites or pneumoperitoneum. Musculoskeletal: No acute abnormalities. L4-5 solid arthrodesis L2  sclerosis stable since at least 2021 and consistent with benign process. Review of the MIP images confirms the above findings. IMPRESSION: Chest CTA: No acute finding. Nearly resolved pneumonia in the right upper lobe. Abdominal CT: 1. No acute finding. 2. Hepatic steatosis and punctate left renal calculi. Electronically Signed   By: Jorje Guild M.D.   On: 04/28/2022 04:39   CT Angio Chest PE W and/or Wo Contrast  Result Date: 04/28/2022 CLINICAL DATA:  Pulmonary embolism suspected. Dizziness with nausea and hypertension. EXAM: CT ANGIOGRAPHY CHEST CT ABDOMEN AND PELVIS WITH CONTRAST TECHNIQUE: Multidetector CT imaging of the chest was performed using the standard protocol during bolus administration of intravenous contrast. Multiplanar CT image reconstructions and MIPs were obtained to evaluate the vascular anatomy. Multidetector CT imaging of the abdomen and pelvis was performed using the standard protocol during bolus administration of intravenous contrast. RADIATION DOSE REDUCTION: This exam was performed according to the departmental dose-optimization program which includes automated exposure control, adjustment of the mA and/or  kV according to patient size and/or use of iterative reconstruction technique. CONTRAST:  76m OMNIPAQUE IOHEXOL 350 MG/ML SOLN COMPARISON:  03/08/2022 FINDINGS: CTA CHEST FINDINGS Cardiovascular: Satisfactory opacification of the pulmonary arteries to the segmental level. No evidence of pulmonary embolism. Normal heart size. No pericardial effusion. Mediastinum/Nodes: Negative for adenopathy or mass. Lungs/Pleura: Improved right upper lobe aeration when compared to prior. Mild patchy dependent pulmonary opacities attributed atelectasis. Musculoskeletal: Thoracic spondylosis. Review of the MIP images confirms the above findings. CT ABDOMEN and PELVIS FINDINGS Hepatobiliary: Steatotic appearance of the liver on the chest CTA before there is parenchymal enhancement. 16 mm subcapsular mass in the lateral right liver, stable and consistent with hemangioma given blood pool density on delayed phase.No evidence of biliary obstruction or stone. Pancreas: Unremarkable. Spleen: Unremarkable. Adrenals/Urinary Tract: Negative adrenals. No hydronephrosis or ureteral stone. Left pelvic kidney with 2 punctate calculi. Unremarkable bladder. Stomach/Bowel:  No obstruction. No appendicitis. Vascular/Lymphatic: No acute vascular abnormality. No mass or adenopathy. Reproductive:No pathologic findings. Other: No ascites or pneumoperitoneum. Musculoskeletal: No acute abnormalities. L4-5 solid arthrodesis L2 sclerosis stable since at least 2021 and consistent with benign process. Review of the MIP images confirms the above findings. IMPRESSION: Chest CTA: No acute finding. Nearly resolved pneumonia in the right upper lobe. Abdominal CT: 1. No acute finding. 2. Hepatic steatosis and punctate left renal calculi. Electronically Signed   By: JJorje GuildM.D.   On: 04/28/2022 04:39   DG Chest Port 1 View  Result Date: 04/28/2022 CLINICAL DATA:  Questionable sepsis evaluate for abnormality EXAM: PORTABLE CHEST 1 VIEW COMPARISON:   03/08/2022 FINDINGS: The heart size and mediastinal contours are within normal limits. Both lungs are clear. The visualized skeletal structures are unremarkable. Cervical spine fusion hardware. IMPRESSION: No active disease. Electronically Signed   By: TPlacido SouM.D.   On: 04/28/2022 03:43    Disposition:  Discharge disposition: 01-Home or Self Care       Discharge Instructions     Diet - low sodium heart healthy   Complete by: As directed    Increase activity slowly   Complete by: As directed    No wound care   Complete by: As directed       Allergies as of 04/29/2022       Reactions   Clindamycin/lincomycin Rash   Diffuse drug reaction eruption   Lamictal [lamotrigine] Rash   Banana Diarrhea   Lipitor [atorvastatin] Other (See Comments)   Severe fatigue   Pineapple Diarrhea  Medication List     STOP taking these medications    diltiazem 120 MG 24 hr capsule Commonly known as: CARDIZEM CD   losartan 50 MG tablet Commonly known as: COZAAR       TAKE these medications    ALPRAZolam XR 1 MG 24 hr tablet Generic drug: ALPRAZolam Take 1 mg by mouth daily as needed for anxiety.   apixaban 5 MG Tabs tablet Commonly known as: Eliquis Take 1 tablet (5 mg total) by mouth 2 (two) times daily.   ARIPiprazole 15 MG tablet Commonly known as: ABILIFY Take 15 mg by mouth at bedtime.   DULoxetine 60 MG capsule Commonly known as: CYMBALTA Take 60 mg by mouth at bedtime.   fluticasone 50 MCG/ACT nasal spray Commonly known as: FLONASE Place 1 spray into both nostrils daily as needed for allergies.   Insulin Aspart FlexPen 100 UNIT/ML Commonly known as: NOVOLOG INJECT 10 UNITS INTO THE SKIN BEFORE BREAKFAST AND LUNCH. INJECT 13 UNITS INTO THE SKIN BEFORE SUPPER. What changed:  how much to take how to take this when to take this additional instructions   lidocaine 5 % Commonly known as: LIDODERM Place 1 patch onto the skin daily as needed  (pain).   omeprazole 20 MG tablet Commonly known as: PRILOSEC OTC Take 20 mg by mouth at bedtime.   ondansetron 4 MG disintegrating tablet Commonly known as: ZOFRAN-ODT '4mg'$  ODT q4 hours prn nausea/vomit What changed:  how much to take how to take this when to take this reasons to take this additional instructions   rosuvastatin 10 MG tablet Commonly known as: CRESTOR Take 1 tablet (10 mg total) by mouth daily.   tiZANidine 4 MG tablet Commonly known as: ZANAFLEX Take 4 mg by mouth at bedtime as needed for muscle spasms.   Xigduo XR 11-998 MG Tb24 Generic drug: Dapagliflozin Pro-metFORMIN ER Take 1 tablet by mouth at bedtime.        Follow-up Information     Swinyer, Lanice Schwab, NP Follow up on 05/04/2022.   Specialty: Cardiology Why: 8:25 AM Contact information: [address] [place] [address] [phone]                 Discharged Condition: good  37 minutes of time have been dedicated to discharge assessment, planning and discharge instructions.   Signed:  Georgann Housekeeper, AGACNP-BC [facility]  See Amion for personal pager PCCM on call pager [phone] until 7pm. Please call Elink 7p-7a. [phone]  04/29/2022 2:36 PM

## 2022-04-29 NOTE — Progress Notes (Addendum)
NAME:  Morgan [name], MRN:  [id number], DOB:  [birth year], LOS: 1 ADMISSION DATE:  04/28/2022 CONSULTATION DATE:  04/28/2022 REFERRING MD:  Christy Gentles - EDP CHIEF COMPLAINT:  Hypotension   History of Present Illness:  57 year old woman who presented to [facility] ED 3/13 for dizziness, nausea, fatigue/malaise and hypotension. PMHx significant for HTN, HLD, Afib (on Eliquis), T2DM, ischemic colitis, GERD, ovarian CA, endometriosis, migraines, anxiety/depression. Recent admission 1/16-1/18 for abdominal pain and blood in stool, c/f ischemic colitis. Seen in ED 1/22, Flu B positive (discharged home on azithromycin).  Patient presented to [facility] ED via EMS for severe hypotension. She reports that she was feeling dizzy and weak at home upon waking and checked her BP; SBP was 60s on home BP cuff. On EMS arrival, she remained A&Ox 4 with no LOC. NS 859m was administered and Epi gtt 158m/minute was administered with improvement in BP to SBP 90s. SpO2 remained 90-96% on 2LNC. CBG was noted to be 281. Of note, patient had a recent [facility]/11[facility]. She has not had any bloody stools since her scope. Denies fever/chills, CP/SOB, vomiting or diarrhea. Endorses nausea, fatigue/malaise (present since prior admission for Flu B/PNA) and weakness. Denies abdominal pain, changes in urination. She has been drinking well but has had poor appetite for a few days and feels "dry".  Labs in ED notable for WBC 9.8, Hgb 12.5, Plt 214. INR 1.3 (on Eliquis). Na 137, K 3.4, CO2 22, BUN/Cr 8/0.95, glucose 218. LFTs unremarkable. Trop 4. LA 2.9 > 2.5. COVID/Flu/RSV negative. BCx pending and broad-spectrum cefepime/vanc/Flagyl started. CXR unremarkable, CTA Chest with nearly resolved RUL PNA/negative for PE, CT A/P without acute findings (punctate renal calculi, steatosis). While in ED, patient became profoundly bradycardic in addition to hypotensive with HR 30s sustained. Levophed was quickly  titrated up with improvement in HR. Bedside POCUS without e/o pericardial effusion or significant cardiac dysfunction.  PCCM consulted for ICU admission.  Pertinent Medical History:   Past Medical History:  Diagnosis Date   Anxiety    Atrial fibrillation ([facility]   Bipolar disorder ([facility]   Breast mass    right, removed and benign   [place]   ovarian ca no radiation no chemo   Cervical dysplasia    Diabetes mellitus without complication (HCC)    Endometriosis    GERD (gastroesophageal reflux disease)    Hypercholesteremia    Hypertension    Migraine    Ovarian cancer ([place]   Pelvic kidney    left   Postpartum depression    hx of   Significant Hospital Events: Including procedures, antibiotic start and stop dates in addition to other pertinent events   [facility], [facility] SBP 60s on EMS arrival, improved to 90s with Epi gtt. Episode of bradycardia to 30s with hypotension in ED, Levo started. Bedside POCUS negative for pericardial effusion/significant cardiac dysfunction. Broad-spectrum cefepime/vanc/Flagyl started. PCCM consulted for admission.  Interim History / Subjective:  Off pressors yesterday afternoon BR remains low normal range Abd discomfort improved over the past 2 days  Objective:  Blood pressure (!) 113/57, pulse 78, temperature 97.8 F (36.6 C), temperature source Oral, resp. rate 15, height '5\' 1"'$  (1.549 m), weight 86.8 kg, last menstrual period 05/12/2011, SpO2 92 %.        Intake/Output Summary (Last 24 hours) at 04/29/2022 0748 Last data filed at 04/29/2022 0500 Gross per 24 hour  Intake 2876.03 ml  Output 4880 ml  Net -2003.97 ml    Filed Weights   04/28/22 0322 04/28/22 0830 04/29/22 0500  Weight: 87.3 kg 89.9 kg 86.8 kg   Physical Examination: General:  overweight middle aged female in NAD Neuro:  Alert, oriented, non-focal HEENT:  [place]/AT, PERRL, no JVD Cardiovascular:  RRR, no MRG. No  edema.  Lungs:  Clear bilateral breath sounds.  Abdomen:  Soft, non-distended, mild tenderness RUQ and RLQ. Unchanged x weeks.  Musculoskeletal:  No acute deformity or ROM limitation. Skin:  Intact, MMM   Resolved Hospital Problem List:    Assessment & Plan:   Undifferentiated shock, query sepsis, diltiazem effect Lactic acidosis Recent RUL PNA - 1/22 Flu B+, discharged home from ED on Zithromax - Antihypertensive fill history shows she had not filled many of her medications until the end of February and then started filling them. She reports some financial issues. She had not been taking medications every day, and within the past few weeks she started taking all of her medications daily.  - In absence of other obvious etiologies for shock/bradycardia, I suspect this is medication related.  - Off pressors - F/u Cx data (BCx) - DC antibiotics  Afib Transient bradycardia CHA2DS2-VASc Score 3. On Eliquis at home. Echo with LVEF 60-65%, No LV wall motion abnormalities, normal diastolic parameters. IVC suggests R atrial pressure 3 mmHg. No significant valvular issues. - Holding diltiazem - Cardiac monitoring - Optimize electrolytes for K > 4, Mg > 2 - Resume Eliquis  HTN HLD Home regimen: Losartan, Diltiazem (Afib) - Hold home antihypertensives currently - Continue crestor - Cardiac monitoring  Ischemic colitis recent Recent colonoscopy 3/8, no records available in [place]. - Monitor for bloody stools - Antibiotic coverage as above  T2DM - SSI - CBGs Q4H - Goal CBG 140-180 - Holding Xigduo XR while inpatient  Anxiety Depression Bipolar disorder - Continue home Abilify, Cymbalta - Xanax PRN at home, hold while hypotensive  Best Practice: (right click and "Reselect all SmartList Selections" daily)   Diet/type: clear liquids > advance DVT prophylaxis: SCDs, DOAC vs. Heparin GI prophylaxis: PPI Lines: N/A Foley:  N/A Code Status:  full code Last date of  multidisciplinary goals of care discussion [Husband/patient updated at bedside 3/13 AM, confirm full code status]  Labs:  CBC: Recent Labs  Lab 04/28/22 0329 04/28/22 0355  WBC 9.8  --   NEUTROABS 4.6  --   HGB 12.5 12.6  HCT 38.4 37.0  MCV 83.8  --   PLT 214  --     Basic Metabolic Panel: Recent Labs  Lab 04/28/22 0329 04/28/22 0355 04/28/22 0842  NA 137 138  --   K 3.4* 3.3*  --   CL 104 103  --   CO2 22  --   --   GLUCOSE 218* 223*  --   BUN 8 10  --   CREATININE 0.95 0.90  --   CALCIUM 8.8*  --   --   MG  --   --  1.5*    GFR: Estimated Creatinine Clearance: 71.5 mL/min (by C-G formula based on SCr of 0.9 mg/dL). Recent Labs  Lab 04/28/22 0329 04/28/22 0515 04/28/22 0842 04/28/22 1146  WBC 9.8  --   --   --   LATICACIDVEN 2.9* 2.5* 3.1* 2.6*    Liver Function Tests: Recent Labs  Lab 04/28/22 0329  AST 25  ALT 24  ALKPHOS 101  BILITOT 0.5  PROT 6.4*  ALBUMIN  3.3*    No results for input(s): "LIPASE", "AMYLASE" in the last 168 hours. No results for input(s): "AMMONIA" in the last 168 hours.  ABG:    Component Value Date/Time   HCO3 24.6 (H) 02/26/2007 1337   TCO2 23 04/28/2022 0355    Coagulation Profile: Recent Labs  Lab 04/28/22 0329  INR 1.3*    Cardiac Enzymes: No results for input(s): "CKTOTAL", "CKMB", "CKMBINDEX", "TROPONINI" in the last 168 hours.  HbA1C: Hemoglobin A1C  Date/Time Value Ref Range Status  10/15/2021 01:55 PM 7.7 (A) 4.0 - 5.6 % Final  04/07/2021 10:58 AM 7.2 (A) 4.0 - 5.6 % Final   Hgb A1c MFr Bld  Date/Time Value Ref Range Status  03/16/2022 08:36 AM 7.4 (H) 4.8 - 5.6 % Final    Comment:             Prediabetes: 5.7 - 6.4          Diabetes: >6.4          Glycemic control for adults with diabetes: <7.0   07/25/2020 01:06 PM 9.8 (H) 4.8 - 5.6 % Final    Comment:             Prediabetes: 5.7 - 6.4          Diabetes: >6.4          Glycemic control for adults with diabetes: <7.0    CBG: Recent Labs   Lab 04/28/22 1517 04/28/22 1940 04/28/22 2329 04/29/22 0337 04/29/22 0720  GLUCAP 114* 128* 86 107* 102*    Review of Systems:    Past Medical History:  She,  has a past medical history of Anxiety, Atrial fibrillation ([place]), Bipolar disorder ([place]), Breast mass, Cancer ([place]) (09/16/2019), Cervical dysplasia, Diabetes mellitus without complication ([place]), Endometriosis, GERD (gastroesophageal reflux disease), Hypercholesteremia, Hypertension, Migraine, Ovarian cancer ([place]), Pelvic kidney, and Postpartum depression.   Surgical History:   Past Surgical History:  Procedure Laterality Date   ABDOMINAL HYSTERECTOMY N/A    Phreesia 07/30/2019   BACK SURGERY  02/16/2012   BREAST BIOPSY Right 2016   BREAST BIOPSY Right 2020   BREAST EXCISIONAL BIOPSY Right 2021   BREAST EXCISIONAL BIOPSY Right 2016   benign   BREAST LUMPECTOMY WITH RADIOACTIVE SEED LOCALIZATION Right 10/28/2014   Procedure: RIGHT BREAST LUMPECTOMY WITH RADIOACTIVE SEED LOCALIZATION;  Surgeon: [name], MD;  Location: [place];  Service: General;  Laterality: Right;   BREAST LUMPECTOMY WITH RADIOACTIVE SEED LOCALIZATION Right 06/18/2019   Procedure: RIGHT BREAST RADIOACTIVE SEED LOCALIZATION LUMPECTOMY;  Surgeon: Jovita Kussmaul, MD;  Location: [place];  Service: General;  Laterality: Right;   BREAST SURGERY N/A    Phreesia 07/30/2019   CESAREAN SECTION  02/15/2001   COLONOSCOPY WITH PROPOFOL N/A 05/01/2018   Procedure: COLONOSCOPY WITH PROPOFOL;  Surgeon: [name], MD;  Location: [facility] ENDOSCOPY;  Service: Gastroenterology;  Laterality: N/A;   COLPOSCOPY     ESOPHAGOGASTRODUODENOSCOPY (EGD) WITH PROPOFOL N/A 05/01/2018   Procedure: ESOPHAGOGASTRODUODENOSCOPY (EGD) WITH PROPOFOL;  Surgeon: [name], MD;  Location: [facility] ENDOSCOPY;  Service: Gastroenterology;  Laterality: N/A;   ESOPHAGOGASTRODUODENOSCOPY (EGD) WITH PROPOFOL N/A 11/13/2018   Procedure: ESOPHAGOGASTRODUODENOSCOPY  (EGD) WITH PROPOFOL;  Surgeon: [name], MD;  Location[facility] END[facility]COPY;  Service: Gastroenterology;  Laterality: N/A;   GYNECOLOGIC CRYOSURGERY     NECK SURGERY  02/15/2010   PELVIC LAPAROSCOPY  02/15/1989    Social History:   reports that she has never smoked. She has never used smokeless  tobacco. She reports that she does not drink alcohol and does not use drugs.   Family History:  Her family history includes Alcohol abuse in her father; Breast cancer (age of onset: 27) in her mother; Depression in her maternal grandmother and mother; Diabetes in her maternal grandmother and mother; Heart attack (age of onset: 54) in her father; Heart disease in her father; Hypertension in her father; Stroke in her father; Throat cancer in her father; Thyroid disease in her mother; Uterine cancer in her maternal grandmother.   Allergies: Allergies  Allergen Reactions   Clindamycin/Lincomycin Rash    Diffuse drug reaction eruption   Lamictal [Lamotrigine] Rash   Banana Diarrhea   Lipitor [Atorvastatin] Other (See Comments)    Severe fatigue   Pineapple Diarrhea   Home Medications: Prior to Admission medications   Medication Sig Start Date End Date Taking? Authorizing Provider  ALPRAZOLAM XR 1 MG 24 hr tablet Take 1 mg by mouth daily as needed for anxiety. 05/08/21   [provider]  apixaban (ELIQUIS) 5 MG TABS tablet Take 1 tablet (5 mg total) by mouth 2 (two) times daily. 03/19/22   Fenton, Clint R, PA  ARIPiprazole (ABILIFY) 15 MG tablet Take 15 mg by mouth at bedtime. 12/21/17   Noemi Chapel, NP  Dapagliflozin Pro-metFORMIN ER (XIGDUO XR) 11-998 MG TB24 Take 1 tablet by mouth at bedtime. 03/25/22   Ronnell Freshwater, NP  diltiazem (CARDIZEM CD) 120 MG 24 hr capsule TAKE 1 CAPSULE (120 MG TOTAL) BY MOUTH DAILY. 04/13/22   Jettie Booze, MD  dilTIAZem HCl Coated Beads (CARTIA XT PO) Take 120 mg by mouth daily at 6 (six) AM. Pt takes 1tablet once a day.    [provider]   DULoxetine (CYMBALTA) 60 MG capsule Take 60 mg by mouth at bedtime. 11/04/16   [provider]  fluticasone (FLONASE) 50 MCG/ACT nasal spray Place 1 spray into both nostrils daily as needed for allergies.    [provider]  Insulin Aspart FlexPen (NOVOLOG) 100 UNIT/ML INJECT 10 UNITS INTO THE SKIN BEFORE BREAKFAST AND LUNCH. INJECT 13 UNITS INTO THE SKIN BEFORE SUPPER. 03/19/22   Ronnell Freshwater, NP  lidocaine (LIDODERM) 5 % Place 1 patch onto the skin daily as needed (pain).    [provider]  losartan (COZAAR) 50 MG tablet Take 1 tablet (50 mg total) by mouth daily. 04/08/22   Jettie Booze, MD  omeprazole (PRILOSEC OTC) 20 MG tablet Take 20 mg by mouth at bedtime.    [provider]  ondansetron (ZOFRAN-ODT) 4 MG disintegrating tablet '4mg'$  ODT q4 hours prn nausea/vomit Patient taking differently: Take 4 mg by mouth daily as needed for nausea or vomiting. 09/20/21   Deno Etienne, DO  rosuvastatin (CRESTOR) 10 MG tablet Take 1 tablet (10 mg total) by mouth daily. 04/08/22   Jettie Booze, MD  tiZANidine (ZANAFLEX) 4 MG tablet Take 1 tablet by mouth at bedtime as needed for muscle spasms. 05/14/21   [provider]    Critical care time:     Georgann Housekeeper, AGACNP-BC [place] for personal pager PCCM on call pager [phone] until 7pm. Please call Elink 7p-7a. [phone]  04/29/2022 7:48 AM

## 2022-04-29 NOTE — TOC Transition Note (Addendum)
Transition of Care [facility]) - CM/SW Discharge Note   Patient Details  Name: Morgan [name] MRN: [id number] Date of Birth: 1965/03/14  Transition of Care [facility]) CM/SW Contact:  Tom-Johnson, Renea Ee, RN Phone Number: 04/29/2022, 2:44 PM   Clinical Narrative:     Patient is scheduled for discharge today. Readmission Prevention Assessment done. Hospital f/u and discharge instructions on AVS. No TOC needs or recommendations noted.  Family to transport at discharge. No further TOC needs noted.   Final next level of care: Home/Self Care Barriers to Discharge: Barriers Resolved   Patient Goals and CMS Choice CMS Medicare.gov Compare Post Acute Care list provided to:: Patient Choice offered to / list presented to : NA  Discharge Placement                  Patient to be transferred to facility by: Family      Discharge Plan and Services Additional resources added to the After Visit Summary for                  DME Arranged: N/A DME Agency: NA       HH Arranged: NA HH Agency: NA        Social Determinants of Health (SDOH) Interventions SDOH Screenings   Food Insecurity: No Food Insecurity (04/29/2022)  Housing: Low Risk  (04/29/2022)  Utilities: Not At Risk (04/29/2022)  Depression (PHQ2-9): Medium Risk (03/25/2022)  Tobacco Use: Low Risk  (04/28/2022)     Readmission Risk Interventions    04/29/2022    2:43 PM  Readmission Risk Prevention Plan  Transportation Screening Complete  PCP or Specialist Appt within 5-7 Days Complete  Home Care Screening Complete  Medication Review (RN CM) Referral to Pharmacy

## 2022-04-29 NOTE — Care Management Obs Status (Signed)
[place] NOTIFICATION   Patient Details  Name: Morgan [name] MRN: [id number] Date of Birth: 1965/08/19   Medicare Observation Status Notification Given:  Yes    Tom-Johnson, Renea Ee, RN 04/29/2022, 3:20 PM

## 2022-05-03 LAB — CULTURE, BLOOD (ROUTINE X 2)
Culture: NO GROWTH
Culture: NO GROWTH

## 2022-05-03 NOTE — Progress Notes (Unsigned)
Cardiology Office Note:    Date:  05/04/2022   ID:  Morgan [name], DOB 1966/01/28, MRN [id number]  PCP:  Lorrene Reid, PA-C (Inactive)   [facility] Providers Cardiologist:  Larae Grooms, MD     Referring MD: No ref. provider found   Chief Complaint: hospital follow-up hypotension  History of Present Illness:    Morgan [name] is a very pleasant 57 y.o. female with a hx of palpitations, hypertension, atrial flutter, aortic atherosclerosis, obesity, and diabetes currently on insulin.   History of CT in 2019 which showed CAC score of 0. In April 2022 she was seen in ER and diagnosed with chest wall pain.  She had prolonged chest pain for 6 hours.  In May 2022 she complained of intermittent palpitations.  ZIO monitor revealed rare PACs, PVCs as well as A-fib with burden of less than 1%.  Longest event was 49 minutes and 13 seconds. There was also a more regular arrhythmia likely atrial flutter.  Seen by Dr. Rayann Heman on 11/03/2020 and started on Eliquis for CHA2DS2-VASc score of 3.  Seen by A-fib clinic on 12/30/2021.  She reported improvement in palpitations.  She had stopped Eliquis due to cost concerns.  Reported workup for chest pain in ED 08/2021, negative for ACS.  She was maintaining sinus rhythm at that visit.  Restarted on Eliquis 5 mg twice daily with samples and co-pay card given. Weight loss was encouraged.  She was advised to follow-up in 6 months.  Last cardiology clinic visit was 04/08/2022 with Dr. Irish Lack. There was a concern that low blood pressure prompted ischemic colitis in January 2024 (admission 1/16-1/18/24). Losartan was decreased to 50 mg daily. Advised to return in 6 months for follow-up.  Admitted to [facility] 3/13-3/14-24 for severe hypotension. SBP 60s on home cuff.  CTA of chest revealed nearly resolved RUL PNA/negative for PE. While in ED she became profoundly bradycardia in addition to hypotensive with HR 30s sustained.  She was admitted to ICU.  2D echo  revealed normal LVEF, significant valve disease. She had recently resumed medications after a brief hiatus. Outpatient follow-up recommended.   Today, she is here for hospital follow-up. Since hospital discharge, continues to feel fatigued, not much improvement. Fatigue started in January and she has not felt back to normal since that time. Reports she was eating and drinking prior to admission but had recently resumed taking antihypertensives on a daily basis. On 04/28/22 she woke up and felt "off," BP readings were in the 50s. Called EMS and BP was in the 70s, she was transported to ED. Since coming home, no BP readings <100 mmHg. BP has been slowly increasing, now consistently 140s or higher. Has chronic chest pain that sometimes radiates down left arm.  Sometimes feels numbness from her elbow to her hand. No associated SOB, n/v, diaphoresis. Has not been able to identify any triggers. Feels her heart beating faster when she is lying down 2-3 x per week, no significant palpitations.  She denies orthopnea, PND, edema, presyncope, syncope.  Past Medical History:  Diagnosis Date   Anxiety    Atrial fibrillation ([place])    Bipolar disorder ([place])    Breast mass    right, removed and benign   Cancer ([place]) 09/16/2019   ovarian ca no radiation no chemo   Cervical dysplasia    Diabetes mellitus without complication (HCC)    Endometriosis    GERD (gastroesophageal reflux disease)    Hypercholesteremia    Hypertension  Migraine    Ovarian cancer ([place])    Pelvic kidney    left   Postpartum depression    hx of    Past Surgical History:  Procedure Laterality Date   ABDOMINAL HYSTERECTOMY N/A    Phreesia 07/30/2019   BACK SURGERY  02/16/2012   BREAST BIOPSY Right 2016   BREAST BIOPSY Right 2020   BREAST EXCISIONAL BIOPSY Right 2021   BREAST EXCISIONAL BIOPSY Right 2016   benign   BREAST LUMPECTOMY WITH RADIOACTIVE SEED LOCALIZATION Right 10/28/2014   Procedure: RIGHT BREAST LUMPECTOMY WITH  RADIOACTIVE SEED LOCALIZATION;  Surgeon: [name], MD;  Location: [place];  Service: General;  Laterality: Right;   BREAST LUMPECTOMY WITH RADIOACTIVE SEED LOCALIZATION Right 06/18/2019   Procedure: RIGHT BREAST RADIOACTIVE SEED LOCALIZATION LUMPECTOMY;  Surgeon: Jovita Kussmaul, MD;  Location: [place];  Service: General;  Laterality: Right;   BREAST SURGERY N/A    Phreesia 07/30/2019   CESAREAN SECTION  02/15/2001   COLONOSCOPY WITH PROPOFOL N/A 05/01/2018   Procedure: COLONOSCOPY WITH PROPOFOL;  Surgeon: [name], MD;  Location: [facility] ENDOSCOPY;  Service: Gastroenterology;  Laterality: N/A;   COLPOSCOPY     ESOPHAGOGASTRODUODENOSCOPY (EGD) WITH PROPOFOL N/A 05/01/2018   Procedure: ESOPHAGOGASTRODUODENOSCOPY (EGD) WITH PROPOFOL;  Surgeon: [name], MD;  Location: [facility] ENDOSCOPY;  Service: Gastroenterology;  Laterality: N/A;   ESOPHAGOGASTRODUODENOSCOPY (EGD) WITH PROPOFOL N/A 11/13/2018   Procedure: ESOPHAGOGASTRODUODENOSCOPY (EGD) WITH PROPOFOL;  Surgeon: [name], MD;  Location[facility] ENDOSCOPY;  Service: Gastroenterology;  Laterality: N/A;   GYNECOLOGIC CRYOSURGERY     NECK SURGERY  02/15/2010   PELVIC LAPAROSCOPY  02/15/1989    Current Medications: Current Meds  Medication Sig   ALPRAZOLAM XR 1 MG 24 hr tablet Take 1 mg by mouth daily as needed for anxiety.   apixaban (ELIQUIS) 5 MG TABS tablet Take 1 tablet (5 mg total) by mouth 2 (two) times daily.   ARIPiprazole (ABILIFY) 15 MG tablet Take 15 mg by mouth at bedtime.   Dapagliflozin Pro-metFORMIN ER (XIGDUO XR) 11-998 MG TB24 Take 1 tablet by mouth at bedtime.   diltiazem (DILACOR XR) 120 MG 24 hr capsule Take 1 capsule (120 mg total) by mouth daily.   DULoxetine (CYMBALTA) 60 MG capsule Take 60 mg by mouth at bedtime.   fluticasone (FLONASE) 50 MCG/ACT nasal spray Place 1 spray into both nostrils daily as needed for allergies.   Insulin Aspart FlexPen (NOVOLOG) 100 UNIT/ML INJECT 10  UNITS INTO THE SKIN BEFORE BREAKFAST AND LUNCH. INJECT 13 UNITS INTO THE SKIN BEFORE SUPPER. (Patient taking differently: Inject 10 Units into the skin in the morning, at noon, and at bedtime.)   lidocaine (LIDODERM) 5 % Place 1 patch onto the skin daily as needed (pain).   omeprazole (PRILOSEC OTC) 20 MG tablet Take 20 mg by mouth at bedtime.   ondansetron (ZOFRAN-ODT) 4 MG disintegrating tablet 4mg  ODT q4 hours prn nausea/vomit (Patient taking differently: Take 4 mg by mouth daily as needed for nausea or vomiting.)   rosuvastatin (CRESTOR) 10 MG tablet Take 1 tablet (10 mg total) by mouth daily.   tiZANidine (ZANAFLEX) 4 MG tablet Take 4 mg by mouth at bedtime as needed for muscle spasms.     Allergies:   Clindamycin/lincomycin, Lamictal [lamotrigine], Banana, Lipitor [atorvastatin], and Pineapple   Social History   Socioeconomic History   Marital status: Married    Spouse name: Not on file   Number of children: Not on file  Years of education: Not on file   Highest education level: Not on file  Occupational History   Not on file  Tobacco Use   Smoking status: Never   Smokeless tobacco: Never   Tobacco comments:    Never smoke 07/01/21  Vaping Use   Vaping Use: Never used  Substance and Sexual Activity   Alcohol use: No   Drug use: No   Sexual activity: Yes    Partners: Male    Birth control/protection: None    Comment: vasectomy  Other Topics Concern   Not on file  Social History Narrative   Lives in [place] with spouse and son.   Disabled.   Social Determinants of Health   Financial Resource Strain: Not on file  Food Insecurity: No Food Insecurity (04/29/2022)   Hunger Vital Sign    Worried About Running Out of Food in the Last Year: Never true    Ran Out of Food in the Last Year: Never true  Transportation Needs: Not on file  Physical Activity: Not on file  Stress: Not on file  Social Connections: Not on file     Family History: The patient's family  history includes Alcohol abuse in her father; Breast cancer (age of onset: 71) in her mother; Depression in her maternal grandmother and mother; Diabetes in her maternal grandmother and mother; Heart attack (age of onset: 79) in her father; Heart disease in her father; Hypertension in her father; Stroke in her father; Throat cancer in her father; Thyroid disease in her mother; Uterine cancer in her maternal grandmother.  ROS:   Please see the history of present illness.    + fatigue All other systems reviewed and are negative.  Labs/Other Studies Reviewed:    The following studies were reviewed today:  Cardiac Studies & Procedures     STRESS TESTS  NM MYOCAR MULTI W/SPECT W 06/15/2017  Narrative  There was no ST segment deviation noted during stress.  The left ventricular ejection fraction is normal (55-65%).  Poor image quality with decreased counts throughout myocardium during stress in comparison to rest images  There is evidence of transient ischemic dilatation with an increased TID of 1.33 which can be seen with multivessel CAD  Consider coronary CTA with FFR   ECHOCARDIOGRAM  ECHOCARDIOGRAM COMPLETE 04/28/2022  Narrative ECHOCARDIOGRAM REPORT  IMPRESSIONS   1. Left ventricular ejection fraction, by estimation, is 60 to 65%. The left ventricle has normal function. The left ventricle has no regional wall motion abnormalities. There is mild left ventricular hypertrophy. Left ventricular diastolic parameters were normal. 2. Right ventricular systolic function is normal. The right ventricular size is normal. There is normal pulmonary artery systolic pressure. The estimated right ventricular systolic pressure is [id number] mmHg. 3. The mitral valve is normal in structure. Trivial mitral valve regurgitation. No evidence of mitral stenosis. 4. The aortic valve is tricuspid. Aortic valve regurgitation is not visualized. No aortic stenosis is present. 5. The inferior vena cava is  normal in size with greater than 50% respiratory variability, suggesting right atrial pressure of 3 mmHg.  FINDINGS Left Ventricle: Left ventricular ejection fraction, by estimation, is 60 to 65%. The left ventricle has normal function. The left ventricle has no regional wall motion abnormalities. The left ventricular internal cavity size was normal in size. There is mild left ventricular hypertrophy. Left ventricular diastolic parameters were normal.  Right Ventricle: The right ventricular size is normal. No increase in right ventricular wall thickness. Right ventricular systolic function is  normal. There is normal pulmonary artery systolic pressure. The tricuspid regurgitant velocity is 2.37 m/s, and with an assumed right atrial pressure of 3 mmHg, the estimated right ventricular systolic pressure is [id number] mmHg.  Left Atrium: Left atrial size was normal in size.  Right Atrium: Right atrial size was normal in size.  Pericardium: There is no evidence of pericardial effusion.  Mitral Valve: The mitral valve is normal in structure. Trivial mitral valve regurgitation. No evidence of mitral valve stenosis. MV peak gradient, 5.6 mmHg. The mean mitral valve gradient is 2.0 mmHg.  Tricuspid Valve: The tricuspid valve is normal in structure. Tricuspid valve regurgitation is trivial. No evidence of tricuspid stenosis.  Aortic Valve: The aortic valve is tricuspid. Aortic valve regurgitation is not visualized. No aortic stenosis is present. Aortic valve mean gradient measures 6.0 mmHg. Aortic valve peak gradient measures 13.4 mmHg. Aortic valve area, by VTI measures 2.59 cm.  Pulmonic Valve: The pulmonic valve was normal in structure. Pulmonic valve regurgitation is trivial. No evidence of pulmonic stenosis.  Aorta: The aortic root is normal in size and structure.  Venous: The inferior vena cava is normal in size with greater than 50% respiratory variability, suggesting right atrial pressure of 3  mmHg.  IAS/Shunts: No atrial level shunt detected by color flow Doppler.    MONITORS  LONG TERM MONITOR (3-14 DAYS) 07/29/2020  Narrative  Normal sinus rhythm with rare PACs and PVCs.  Intermittent atrial fibrillation with occasional rapid ventricular response, overall < 1% burden.  Several short episodes of AFib noted.   Patch Wear Time:  12 days and 14 hours (2022-05-28T20:13:38-0400 to 2022-06-10T10:23:04-398)  Patient had a min HR of 65 bpm, max HR of 203 bpm, and avg HR of 100 bpm. Predominant underlying rhythm was Sinus Rhythm. 2 Supraventricular Tachycardia runs occurred, the run with the fastest interval lasting 4 beats with a max rate of 203 bpm, the longest lasting 5 beats with an avg rate of 131 bpm. Atrial Fibrillation occurred (<1% burden), ranging from 103-195 bpm (avg of 144 bpm), the longest lasting 49 mins 13 secs with an avg rate of 144 bpm. Isolated SVEs were rare (<1.0%), SVE Couplets were rare (<1.0%), and SVE Triplets were rare (<1.0%). No Isolated VEs, VE Couplets, or VE Triplets were present.   CT SCANS  CT CORONARY MORPH W/CTA COR W/SCORE 06/17/2017  Addendum 06/17/2017  8:09 AM ADDENDUM REPORT: 06/17/2017 08:07  EXAM: OVER-READ INTERPRETATION  CT CHEST  The following report is an over-read performed by radiologist Dr. Collene Leyden [facility] on 06/17/2017. This over-read does not include interpretation of cardiac or coronary anatomy or pathology. The coronary CTA interpretation by the cardiologist is attached.  COMPARISON:  None.  FINDINGS: Heart is normal size. Visualized aorta is normal caliber. No adenopathy in the lower mediastinum or hila.  Minimal dependent atelectasis.  No effusions.  Diffuse fatty infiltration of the liver. Chest wall soft tissues are unremarkable. No acute bony abnormality.  IMPRESSION: Fatty infiltration of the liver.  No acute extra cardiac abnormality.   Electronically Signed By: Rolm Baptise  M.D. On: 06/17/2017 08:07  Narrative CLINICAL DATA:  57 year old female with atypical chest pain and equivocal stress test  EXAM: Cardiac/Coronary  CT  TECHNIQUE: The patient was scanned on a [employer].  FINDINGS: A 120 kV prospective scan was triggered in the descending thoracic aorta at 111 HU's. Axial non-contrast 3 mm slices were carried out through the heart. The data set was analyzed on a dedicated  work station and scored using the [employer]. Gantry rotation speed was 250 msecs and collimation was .6 mm. No beta blockade and 0.8 mg of sl NTG was given. The 3D data set was reconstructed in 5% intervals of the 67-82 % of the R-R cycle. Diastolic phases were analyzed on a dedicated work station using MPR, MIP and VRT modes. The patient received 80 cc of contrast.  Aorta:  Normal size.  No calcifications.  No dissection.  Aortic Valve:  Trileaflet.  No calcifications.  Coronary Arteries:  Normal coronary origin.  Right dominance.  RCA is a very large dominant artery that gives rise to PDA and PLVB. There is no plaque.  Left main is a large artery that gives rise to LAD, ramus intermedius and LCX arteries. Left main has no plaque.  LAD is a medium size vessel that has no plaque.  RI is a medium size artery that has no plaque.  LCX is a medium size non-dominant artery that has no plaque.  Other findings:  Normal pulmonary vein drainage into the left atrium.  Normal let atrial appendage without a thrombus.  IMPRESSION: 1. Coronary calcium score of 0. This was 0 percentile for age and sex matched control.  2. Normal coronary origin with right dominance.  3. No evidence of CAD.  4. Mildly dilated pulmonary artery measuring 31 mm suggestive of pulmonary hypertension.  Electronically Signed: By: Ena Dawley On: 06/16/2017 15:46           Recent Labs: 06/24/2021: TSH 1.250 04/28/2022: ALT 24 04/29/2022: BUN 6; Creatinine, Ser 0.63;  Hemoglobin 12.3; Magnesium 1.9; Platelets 163; Potassium 4.1; Sodium 140  Recent Lipid Panel    Component Value Date/Time   CHOL 141 04/07/2021 1131   TRIG 121 04/07/2021 1131   HDL 39 (L) 04/07/2021 1131   CHOLHDL 3.6 04/07/2021 1131   CHOLHDL 3.7 06/16/2017 0417   VLDL 29 06/16/2017 0417   LDLCALC 80 04/07/2021 1131     Risk Assessment/Calculations:    CHA2DS2-VASc Score = 3  {This indicates a 3.2% annual risk of stroke. The patient's score is based upon: CHF History: 0 HTN History: 1 Diabetes History: 1 Stroke History: 0 Vascular Disease History: 0 Age Score: 0 Gender Score: 1    Physical Exam:    VS:  BP (!) 140/80   Pulse 70   Ht 5\' 1"  (1.549 m)   Wt 189 lb 3.2 oz (85.8 kg)   LMP 05/12/2011   SpO2 97%   BMI 35.75 kg/m     Wt Readings from Last 3 Encounters:  05/04/22 189 lb 3.2 oz (85.8 kg)  04/29/22 191 lb 5.8 oz (86.8 kg)  04/08/22 189 lb (85.7 kg)     GEN:  Well nourished, well developed in no acute distress HEENT: Normal NECK: No JVD; No carotid bruits CARDIAC: RRR, no murmurs, rubs, gallops RESPIRATORY:  Clear to auscultation without rales, wheezing or rhonchi  ABDOMEN: Soft, non-tender, non-distended MUSCULOSKELETAL:  No edema; No deformity. 2+ pedal pulses, equal bilaterally SKIN: Warm and dry NEUROLOGIC:  Alert and oriented x 3 PSYCHIATRIC:  Normal affect   EKG:  EKG is not ordered today.    HYPERTENSION CONTROL Vitals:   05/04/22 0815 05/04/22 0849  BP: (!) 148/80 (!) 140/80    The patient's blood pressure is elevated above target today.  In order to address the patient's elevated BP: A new medication was prescribed today.     Diagnoses:    1. Paroxysmal atrial fibrillation (HCC)  2. Secondary hypercoagulable state ([place])   3. Aortic atherosclerosis ([place])   4. Hyperlipidemia LDL goal <70   5. Essential hypertension   6. Hypotension, unspecified hypotension type   7. Precordial pain    Assessment and Plan:     PAF on  chronic anticoagulation: Clinically appears to be in sinus rhythm today.  HR is well-controlled. No bleeding concerns. Now has PA for Eliquis so no concerns with cost. Continue Eliquis 5 mg twice daily which is appropriate dose. Restarting diltiazem as noted below. Recommend follow-up in A Fib clinic in 2 months.   Hypotension: History of essential hypertension with profound hypotension 04/2022 requiring admission. Antihypertensives were held at d/c. Since discharge, BP has been gradually increasing. We will restart diltiazem 120 mg daily.  I have asked her to track BP and report back to [place] in 2 weeks. Encouraged heart healthy, low sodium diet.   Aortic atherosclerosis/hyperlipidemia: LDL 80 on 04/07/2021.  She was started on rosuvastatin 10 mg February 2024 by Dr. Irish Lack. Has lab appointment in May for repeat  lipid testing.   Chest pain: Chronic history of atypical chest pain. Pain is not exertional. Is occasionally accompanied by left arm numbness, no associated SOB, diaphoresis, n/v. No obvious triggers. Also having fatigue since hospital d/c. Recent echo with normal LVEF, no regional wall motion abnormalities. Coronary CTA with calcium score of 0, no evidence of CAD 06/2017. Could consider repeat CT. Advised her to continue to monitor and report if symptoms worsen.      Disposition: Follow-up in May with A Fib Clinic/6 months with Dr. Irish Lack  Medication Adjustments/Labs and Tests Ordered: Current medicines are reviewed at length with the patient today.  Concerns regarding medicines are outlined above.  No orders of the defined types were placed in this encounter.  Meds ordered this encounter  Medications   diltiazem (DILACOR XR) 120 MG 24 hr capsule    Sig: Take 1 capsule (120 mg total) by mouth daily.    Dispense:  90 capsule    Refill:  3    Patient Instructions  Medication Instructions:   RESTART Diltiazem one (1) tablet by mouth ( 120 mg) daily.   *If you need a refill on your  cardiac medications before your next appointment, please call your pharmacy*   Lab Work:  Keep your lab appointment in May.  If you have labs (blood work) drawn today and your tests are completely normal, you will receive your results only by: [place] (if you have MyChart) OR A paper copy in the mail If you have any lab test that is abnormal or we need to change your treatment, we will call you to review the results.   Testing/Procedures:  None ordered.   Follow-Up: At [facility], you and your health needs are our priority.  As part of our continuing mission to provide you with exceptional heart care, we have created designated Provider Care Teams.  These Care Teams include your primary Cardiologist (physician) and Advanced Practice Providers (APPs -  Physician Assistants and Nurse Practitioners) who all work together to provide you with the care you need, when you need it.  We recommend signing up for the patient portal called "MyChart".  Sign up information is provided on this After Visit Summary.  MyChart is used to connect with patients for Virtual Visits (Telemedicine).  Patients are able to view lab/test results, encounter notes, upcoming appointments, etc.  Non-urgent messages can be sent to your provider as well.  To learn more about what you can do with MyChart, go to [email or web address].    Your next appointment:   2 month(s)  Provider:   You will follow up in the [facility] located at [facility]. Your provider will be: Clint R. Fenton, PA-C    Other Instructions  HOW TO TAKE YOUR BLOOD PRESSURE  Rest 5 minutes before taking your blood pressure. Don't  smoke or drink caffeinated beverages for at least 30 minutes before. Take your blood pressure before (not after) you eat. Sit comfortably with your back supported and both feet on the floor ( don't cross your legs). Elevate your arm to heart level on a table or a  desk. Use the proper sized cuff.  It should fit smoothly and snugly around your bare upper arm.  There should be  Enough room to slip a fingertip under the cuff.  The bottom edge of the cuff should be 1 inch above the crease Of the elbow. Please monitor your blood pressure once daily 2 hours after your am medication. If you blood pressure Consistently remains above [id number] (systolic) top number or over 80 ( diastolic) bottom number X 3 days  Consecutively.  Please call our office at [phone] or send Mychart message in two weeks.    ----Avoid cold medicines with D or DM at the end of them----  DASH Eating Plan DASH stands for Dietary Approaches to Stop Hypertension. The DASH eating plan is a healthy eating plan that has been shown to: Reduce high blood pressure (hypertension). Reduce your risk for type 2 diabetes, heart disease, and stroke. Help with weight loss. What are tips for following this plan? Reading food labels Check food labels for the amount of salt (sodium) per serving. Choose foods with less than 5 percent of the Daily Value of sodium. Generally, foods with less than 300 milligrams (mg) of sodium per serving fit into this eating plan. To find whole grains, look for the word "whole" as the first word in the ingredient list. Shopping Buy products labeled as "low-sodium" or "no salt added." Buy fresh foods. Avoid canned foods and pre-made or frozen meals. Cooking Avoid adding salt when cooking. Use salt-free seasonings or herbs instead of table salt or sea salt. Check with your health care provider or pharmacist before using salt substitutes. Do not fry foods. Cook foods using healthy methods such as baking, boiling, grilling, roasting, and broiling instead. Cook with heart-healthy oils, such as olive, canola, avocado, soybean, or sunflower oil. Meal planning  Eat a balanced diet that includes: 4 or more servings of fruits and 4 or more servings of vegetables each day. Try to  fill one-half of your plate with fruits and vegetables. 6-8 servings of whole grains each day. Less than 6 oz (170 g) of lean meat, poultry, or fish each day. A 3-oz (85-g) serving of meat is about the same size as a deck of cards. One egg equals 1 oz (28 g). 2-3 servings of low-fat dairy each day. One serving is 1 cup (237 mL). 1 serving of nuts, seeds, or beans 5 times each week. 2-3 servings of heart-healthy fats. Healthy fats called omega-3 fatty acids are found in foods such as walnuts, flaxseeds, fortified milks, and eggs. These fats are also found in cold-water fish, such as sardines, salmon, and mackerel. Limit how much you eat of: Canned or prepackaged foods. Food that is high in trans fat, such as some fried foods. Food that is high in  saturated fat, such as fatty meat. Desserts and other sweets, sugary drinks, and other foods with added sugar. Full-fat dairy products. Do not salt foods before eating. Do not eat more than 4 egg yolks a week. Try to eat at least 2 vegetarian meals a week. Eat more home-cooked food and less restaurant, buffet, and fast food. Lifestyle When eating at a restaurant, ask that your food be prepared with less salt or no salt, if possible. If you drink alcohol: Limit how much you use to: 0-1 drink a day for women who are not pregnant. 0-2 drinks a day for men. Be aware of how much alcohol is in your drink. In the [place] one drink equals one 12 oz bottle of beer (355 mL), one 5 oz glass of wine (148 mL), or one 1 oz glass of hard liquor (44 mL). General information Avoid eating more than 2,300 mg of salt a day. If you have hypertension, you may need to reduce your sodium intake to 1,500 mg a day. Work with your health care provider to maintain a healthy body weight or to lose weight. Ask what an ideal weight is for you. Get at least 30 minutes of exercise that causes your heart to beat faster (aerobic exercise) most days of the week. Activities may include  walking, swimming, or biking. Work with your health care provider or dietitian to adjust your eating plan to your individual calorie needs. What foods should I eat? Fruits All fresh, dried, or frozen fruit. Canned fruit in natural juice (without added sugar). Vegetables Fresh or frozen vegetables (raw, steamed, roasted, or grilled). Low-sodium or reduced-sodium tomato and vegetable juice. Low-sodium or reduced-sodium tomato sauce and tomato paste. Low-sodium or reduced-sodium canned vegetables. Grains Whole-grain or whole-wheat bread. Whole-grain or whole-wheat pasta. Brown rice. [place]. Bulgur. Whole-grain and low-sodium cereals. Pita bread. Low-fat, low-sodium crackers. Whole-wheat flour tortillas. Meats and other proteins Skinless chicken or [place]. Ground chicken or [place]. Pork with fat trimmed off. Fish and seafood. Egg whites. Dried beans, peas, or lentils. Unsalted nuts, nut butters, and seeds. Unsalted canned beans. Lean cuts of beef with fat trimmed off. Low-sodium, lean precooked or cured meat, such as sausages or meat loaves. Dairy Low-fat (1%) or fat-free (skim) milk. Reduced-fat, low-fat, or fat-free cheeses. Nonfat, low-sodium ricotta or cottage cheese. Low-fat or nonfat yogurt. Low-fat, low-sodium cheese. Fats and oils Soft margarine without trans fats. Vegetable oil. Reduced-fat, low-fat, or light mayonnaise and salad dressings (reduced-sodium). Canola, safflower, olive, avocado, soybean, and sunflower oils. Avocado. Seasonings and condiments Herbs. Spices. Seasoning mixes without salt. Other foods Unsalted popcorn and pretzels. Fat-free sweets. The items listed above may not be a complete list of foods and beverages you can eat. Contact a dietitian for more information. What foods should I avoid? Fruits Canned fruit in a light or heavy syrup. Fried fruit. Fruit in cream or butter sauce. Vegetables Creamed or fried vegetables. Vegetables in a cheese sauce. Regular  canned vegetables (not low-sodium or reduced-sodium). Regular canned tomato sauce and paste (not low-sodium or reduced-sodium). Regular tomato and vegetable juice (not low-sodium or reduced-sodium). Angie Fava. Olives. Grains Baked goods made with fat, such as croissants, muffins, or some breads. Dry pasta or rice meal packs. Meats and other proteins Fatty cuts of meat. Ribs. Fried meat. Berniece Salines. Bologna, salami, and other precooked or cured meats, such as sausages or meat loaves. Fat from the back of a pig (fatback). Bratwurst. Salted nuts and seeds. Canned beans with added salt. Canned or smoked fish. Whole  eggs or egg yolks. Chicken or [place] with skin. Dairy Whole or 2% milk, cream, and half-and-half. Whole or full-fat cream cheese. Whole-fat or sweetened yogurt. Full-fat cheese. Nondairy creamers. Whipped toppings. Processed cheese and cheese spreads. Fats and oils Butter. Stick margarine. Lard. Shortening. Ghee. Bacon fat. Tropical oils, such as coconut, palm kernel, or palm oil. Seasonings and condiments Onion salt, garlic salt, seasoned salt, table salt, and sea salt. Worcestershire sauce. Tartar sauce. Barbecue sauce. Teriyaki sauce. Soy sauce, including reduced-sodium. Steak sauce. Canned and packaged gravies. Fish sauce. Oyster sauce. Cocktail sauce. Store-bought horseradish. Ketchup. Mustard. Meat flavorings and tenderizers. Bouillon cubes. Hot sauces. Pre-made or packaged marinades. Pre-made or packaged taco seasonings. Relishes. Regular salad dressings. Other foods Salted popcorn and pretzels. The items listed above may not be a complete list of foods and beverages you should avoid. Contact a dietitian for more information. Where to find more information National Heart, Lung, and Blood Institute: https://wilson-eaton.com/ American Heart Association: www.heart.org Academy of Nutrition and Dietetics: www.eatright.[place]: www.kidney.org Summary The DASH eating plan is a  healthy eating plan that has been shown to reduce high blood pressure (hypertension). It may also reduce your risk for type 2 diabetes, heart disease, and stroke. When on the DASH eating plan, aim to eat more fresh fruits and vegetables, whole grains, lean proteins, low-fat dairy, and heart-healthy fats. With the DASH eating plan, you should limit salt (sodium) intake to 2,300 mg a day. If you have hypertension, you may need to reduce your sodium intake to 1,500 mg a day. Work with your health care provider or dietitian to adjust your eating plan to your individual calorie needs. This information is not intended to replace advice given to you by your health care provider. Make sure you discuss any questions you have with your health care provider. Document Revised: 01/05/2019 Document Reviewed: 01/05/2019 Elsevier Patient Education  2023 [employer], Emmaline Life, NP  05/04/2022 9:03 AM    [place]

## 2022-05-04 ENCOUNTER — Ambulatory Visit: Payer: Federal, State, Local not specified - PPO | Admitting: Nurse Practitioner

## 2022-05-04 ENCOUNTER — Encounter: Payer: Self-pay | Admitting: Nurse Practitioner

## 2022-05-04 VITALS — BP 140/80 | HR 70 | Ht 61.0 in | Wt 189.2 lb

## 2022-05-04 DIAGNOSIS — I10 Essential (primary) hypertension: Secondary | ICD-10-CM

## 2022-05-04 DIAGNOSIS — E785 Hyperlipidemia, unspecified: Secondary | ICD-10-CM | POA: Diagnosis not present

## 2022-05-04 DIAGNOSIS — I700 Atherosclerosis of aorta: Secondary | ICD-10-CM

## 2022-05-04 DIAGNOSIS — D6869 Other thrombophilia: Secondary | ICD-10-CM

## 2022-05-04 DIAGNOSIS — I480 Paroxysmal atrial fibrillation: Secondary | ICD-10-CM | POA: Diagnosis not present

## 2022-05-04 DIAGNOSIS — R072 Precordial pain: Secondary | ICD-10-CM

## 2022-05-04 DIAGNOSIS — I959 Hypotension, unspecified: Secondary | ICD-10-CM

## 2022-05-04 MED ORDER — DILTIAZEM HCL ER 120 MG PO CP24: 120.0000 mg | ORAL_CAPSULE | Freq: Every day | ORAL | 3 refills | Status: DC

## 2022-05-04 NOTE — Patient Instructions (Signed)
Medication Instructions:   RESTART Diltiazem one (1) tablet by mouth ( 120 mg) daily.   *If you need a refill on your cardiac medications before your next appointment, please call your pharmacy*   Lab Work:  Keep your lab appointment in May.  If you have labs (blood work) drawn today and your tests are completely normal, you will receive your results only by: [place] (if you have MyChart) OR A paper copy in the mail If you have any lab test that is abnormal or we need to change your treatment, we will call you to review the results.   Testing/Procedures:  None ordered.   Follow-Up: At [facility], you and your health needs are our priority.  As part of our continuing mission to provide you with exceptional heart care, we have created designated Provider Care Teams.  These Care Teams include your primary Cardiologist (physician) and Advanced Practice Providers (APPs -  Physician Assistants and Nurse Practitioners) who all work together to provide you with the care you need, when you need it.  We recommend signing up for the patient portal called "MyChart".  Sign up information is provided on this After Visit Summary.  MyChart is used to connect with patients for Virtual Visits (Telemedicine).  Patients are able to view lab/test results, encounter notes, upcoming appointments, etc.  Non-urgent messages can be sent to your provider as well.   To learn more about what you can do with MyChart, go to [email or web address].    Your next appointment:   2 month(s)  Provider:   You will follow up in the [facility] located at [facility]. Your provider will be: Clint R. Fenton, PA-C    Other Instructions  HOW TO TAKE YOUR BLOOD PRESSURE  Rest 5 minutes before taking your blood pressure. Don't  smoke or drink caffeinated beverages for at least 30 minutes before. Take your blood pressure before (not after) you eat. Sit comfortably with your  back supported and both feet on the floor ( don't cross your legs). Elevate your arm to heart level on a table or a desk. Use the proper sized cuff.  It should fit smoothly and snugly around your bare upper arm.  There should be  Enough room to slip a fingertip under the cuff.  The bottom edge of the cuff should be 1 inch above the crease Of the elbow. Please monitor your blood pressure once daily 2 hours after your am medication. If you blood pressure Consistently remains above [id number] (systolic) top number or over 80 ( diastolic) bottom number X 3 days  Consecutively.  Please call our office at [phone] or send Mychart message in two weeks.    ----Avoid cold medicines with D or DM at the end of them----  DASH Eating Plan DASH stands for Dietary Approaches to Stop Hypertension. The DASH eating plan is a healthy eating plan that has been shown to: Reduce high blood pressure (hypertension). Reduce your risk for type 2 diabetes, heart disease, and stroke. Help with weight loss. What are tips for following this plan? Reading food labels Check food labels for the amount of salt (sodium) per serving. Choose foods with less than 5 percent of the Daily Value of sodium. Generally, foods with less than 300 milligrams (mg) of sodium per serving fit into this eating plan. To find whole grains, look for the word "whole" as the first word in the ingredient list. Shopping Buy products labeled as "low-sodium" or "no  salt added." Buy fresh foods. Avoid canned foods and pre-made or frozen meals. Cooking Avoid adding salt when cooking. Use salt-free seasonings or herbs instead of table salt or sea salt. Check with your health care provider or pharmacist before using salt substitutes. Do not fry foods. Cook foods using healthy methods such as baking, boiling, grilling, roasting, and broiling instead. Cook with heart-healthy oils, such as olive, canola, avocado, soybean, or sunflower oil. Meal  planning  Eat a balanced diet that includes: 4 or more servings of fruits and 4 or more servings of vegetables each day. Try to fill one-half of your plate with fruits and vegetables. 6-8 servings of whole grains each day. Less than 6 oz (170 g) of lean meat, poultry, or fish each day. A 3-oz (85-g) serving of meat is about the same size as a deck of cards. One egg equals 1 oz (28 g). 2-3 servings of low-fat dairy each day. One serving is 1 cup (237 mL). 1 serving of nuts, seeds, or beans 5 times each week. 2-3 servings of heart-healthy fats. Healthy fats called omega-3 fatty acids are found in foods such as walnuts, flaxseeds, fortified milks, and eggs. These fats are also found in cold-water fish, such as sardines, salmon, and mackerel. Limit how much you eat of: Canned or prepackaged foods. Food that is high in trans fat, such as some fried foods. Food that is high in saturated fat, such as fatty meat. Desserts and other sweets, sugary drinks, and other foods with added sugar. Full-fat dairy products. Do not salt foods before eating. Do not eat more than 4 egg yolks a week. Try to eat at least 2 vegetarian meals a week. Eat more home-cooked food and less restaurant, buffet, and fast food. Lifestyle When eating at a restaurant, ask that your food be prepared with less salt or no salt, if possible. If you drink alcohol: Limit how much you use to: 0-1 drink a day for women who are not pregnant. 0-2 drinks a day for men. Be aware of how much alcohol is in your drink. In the [place] one drink equals one 12 oz bottle of beer (355 mL), one 5 oz glass of wine (148 mL), or one 1 oz glass of hard liquor (44 mL). General information Avoid eating more than 2,300 mg of salt a day. If you have hypertension, you may need to reduce your sodium intake to 1,500 mg a day. Work with your health care provider to maintain a healthy body weight or to lose weight. Ask what an ideal weight is for you. Get at  least 30 minutes of exercise that causes your heart to beat faster (aerobic exercise) most days of the week. Activities may include walking, swimming, or biking. Work with your health care provider or dietitian to adjust your eating plan to your individual calorie needs. What foods should I eat? Fruits All fresh, dried, or frozen fruit. Canned fruit in natural juice (without added sugar). Vegetables Fresh or frozen vegetables (raw, steamed, roasted, or grilled). Low-sodium or reduced-sodium tomato and vegetable juice. Low-sodium or reduced-sodium tomato sauce and tomato paste. Low-sodium or reduced-sodium canned vegetables. Grains Whole-grain or whole-wheat bread. Whole-grain or whole-wheat pasta. Brown rice. [place]. Bulgur. Whole-grain and low-sodium cereals. Pita bread. Low-fat, low-sodium crackers. Whole-wheat flour tortillas. Meats and other proteins Skinless chicken or [place]. Ground chicken or [place]. Pork with fat trimmed off. Fish and seafood. Egg whites. Dried beans, peas, or lentils. Unsalted nuts, nut butters, and seeds. Unsalted canned  beans. Lean cuts of beef with fat trimmed off. Low-sodium, lean precooked or cured meat, such as sausages or meat loaves. Dairy Low-fat (1%) or fat-free (skim) milk. Reduced-fat, low-fat, or fat-free cheeses. Nonfat, low-sodium ricotta or cottage cheese. Low-fat or nonfat yogurt. Low-fat, low-sodium cheese. Fats and oils Soft margarine without trans fats. Vegetable oil. Reduced-fat, low-fat, or light mayonnaise and salad dressings (reduced-sodium). Canola, safflower, olive, avocado, soybean, and sunflower oils. Avocado. Seasonings and condiments Herbs. Spices. Seasoning mixes without salt. Other foods Unsalted popcorn and pretzels. Fat-free sweets. The items listed above may not be a complete list of foods and beverages you can eat. Contact a dietitian for more information. What foods should I avoid? Fruits Canned fruit in a light or heavy  syrup. Fried fruit. Fruit in cream or butter sauce. Vegetables Creamed or fried vegetables. Vegetables in a cheese sauce. Regular canned vegetables (not low-sodium or reduced-sodium). Regular canned tomato sauce and paste (not low-sodium or reduced-sodium). Regular tomato and vegetable juice (not low-sodium or reduced-sodium). Angie Fava. Olives. Grains Baked goods made with fat, such as croissants, muffins, or some breads. Dry pasta or rice meal packs. Meats and other proteins Fatty cuts of meat. Ribs. Fried meat. Berniece Salines. Bologna, salami, and other precooked or cured meats, such as sausages or meat loaves. Fat from the back of a pig (fatback). Bratwurst. Salted nuts and seeds. Canned beans with added salt. Canned or smoked fish. Whole eggs or egg yolks. Chicken or [place] with skin. Dairy Whole or 2% milk, cream, and half-and-half. Whole or full-fat cream cheese. Whole-fat or sweetened yogurt. Full-fat cheese. Nondairy creamers. Whipped toppings. Processed cheese and cheese spreads. Fats and oils Butter. Stick margarine. Lard. Shortening. Ghee. Bacon fat. Tropical oils, such as coconut, palm kernel, or palm oil. Seasonings and condiments Onion salt, garlic salt, seasoned salt, table salt, and sea salt. Worcestershire sauce. Tartar sauce. Barbecue sauce. Teriyaki sauce. Soy sauce, including reduced-sodium. Steak sauce. Canned and packaged gravies. Fish sauce. Oyster sauce. Cocktail sauce. Store-bought horseradish. Ketchup. Mustard. Meat flavorings and tenderizers. Bouillon cubes. Hot sauces. Pre-made or packaged marinades. Pre-made or packaged taco seasonings. Relishes. Regular salad dressings. Other foods Salted popcorn and pretzels. The items listed above may not be a complete list of foods and beverages you should avoid. Contact a dietitian for more information. Where to find more information National Heart, Lung, and Blood Institute: https://wilson-eaton.com/ American Heart Association:  www.heart.org Academy of Nutrition and Dietetics: www.eatright.[place]: www.kidney.org Summary The DASH eating plan is a healthy eating plan that has been shown to reduce high blood pressure (hypertension). It may also reduce your risk for type 2 diabetes, heart disease, and stroke. When on the DASH eating plan, aim to eat more fresh fruits and vegetables, whole grains, lean proteins, low-fat dairy, and heart-healthy fats. With the DASH eating plan, you should limit salt (sodium) intake to 2,300 mg a day. If you have hypertension, you may need to reduce your sodium intake to 1,500 mg a day. Work with your health care provider or dietitian to adjust your eating plan to your individual calorie needs. This information is not intended to replace advice given to you by your health care provider. Make sure you discuss any questions you have with your health care provider. Document Revised: 01/05/2019 Document Reviewed: 01/05/2019 Elsevier Patient Education  [place].

## 2022-05-06 ENCOUNTER — Telehealth: Payer: Self-pay | Admitting: Interventional Cardiology

## 2022-05-06 NOTE — Telephone Encounter (Signed)
Pt c/o BP issue: STAT if pt c/o blurred vision, one-sided weakness or slurred speech  1. What are your last 5 BP readings? 83/57; 107/67  2. Are you having any other symptoms (ex. Dizziness, headache, blurred vision, passed out)? Dizziness   3. What is your BP issue? low

## 2022-05-06 NOTE — Telephone Encounter (Signed)
Please ask her to hold diltiazem and continue to monitor blood pressure twice daily. Healthy forms of salt such as pretzels, salted vegetables, V8. Report blood pressure and pulse readings in 2 weeks. If BP remains < [id number] systolic today and tomorrow call back before 4 pm tomorrow.

## 2022-05-06 NOTE — Telephone Encounter (Signed)
Patient notified of instructions from Christen Bame, NP

## 2022-05-06 NOTE — Telephone Encounter (Signed)
I spoke with patient. She reports most recent BP at 9:45 AM today is 107/67, heart rate 59. Last night at midnight readings were 83/57 and 51. She took Cardizem at 8:30 last evening. At 9:30 PM last night BP was 90/59.  She checked again around 3 AM and 6 AM and readings were around 90/60. Highest reading yesterday was at 1:49 PM--116/67, 63. She resumed Cardizem on 3/19 in the evening.  Didn't check BP at that time. Currently she is feeling a "little dizzy and nauseated".  Dizziness has been occurring off and on since last night.  Nausea started about an hour ago.  Patient did eat breakfast this AM--sausage biscuit and Dr Malachi Bonds.  I advised her to drink at least 8 oz of water now and have a salty snack.  Advised to go to ED if any changes.

## 2022-05-20 ENCOUNTER — Other Ambulatory Visit: Payer: Self-pay | Admitting: Nurse Practitioner

## 2022-05-20 MED ORDER — METOPROLOL TARTRATE 25 MG PO TABS: 12.5000 mg | ORAL_TABLET | Freq: Two times a day (BID) | ORAL | 3 refills | Status: DC

## 2022-05-31 ENCOUNTER — Emergency Department (HOSPITAL_COMMUNITY)
Admission: EM | Admit: 2022-05-31 | Discharge: 2022-06-01 | Disposition: A | Discharge status: Home / Self Care | Payer: Federal, State, Local not specified - PPO | Attending: Emergency Medicine | Admitting: Emergency Medicine

## 2022-05-31 ENCOUNTER — Emergency Department (HOSPITAL_COMMUNITY): Payer: Federal, State, Local not specified - PPO

## 2022-05-31 DIAGNOSIS — M545 Low back pain, unspecified: Secondary | ICD-10-CM | POA: Diagnosis not present

## 2022-05-31 DIAGNOSIS — R1031 Right lower quadrant pain: Secondary | ICD-10-CM | POA: Insufficient documentation

## 2022-05-31 DIAGNOSIS — R109 Unspecified abdominal pain: Secondary | ICD-10-CM

## 2022-05-31 LAB — COMPREHENSIVE METABOLIC PANEL
ALT: 25 U/L (ref 0–44)
AST: 25 U/L (ref 15–41)
Albumin: 3.4 g/dL — ABNORMAL LOW (ref 3.5–5.0)
Alkaline Phosphatase: 115 U/L (ref 38–126)
Anion gap: 7 (ref 5–15)
BUN: 7 mg/dL (ref 6–20)
CO2: 27 mmol/L (ref 22–32)
Calcium: 9 mg/dL (ref 8.9–10.3)
Chloride: 102 mmol/L (ref 98–111)
Creatinine, Ser: 0.67 mg/dL (ref 0.44–1.00)
GFR, Estimated: >60 mL/min (ref >60)
Glucose, Bld: 286 mg/dL — ABNORMAL HIGH (ref 70–99)
Potassium: 3.9 mmol/L (ref 3.5–5.1)
Sodium: 136 mmol/L (ref 135–145)
Total Bilirubin: 0.8 mg/dL (ref 0.3–1.2)
Total Protein: 6.7 g/dL (ref 6.5–8.1)

## 2022-05-31 LAB — CBC
HCT: 40.8 % (ref 36.0–46.0)
Hemoglobin: 13.1 g/dL (ref 12.0–15.0)
MCH: 26.6 pg (ref 26.0–34.0)
MCHC: 32.1 g/dL (ref 30.0–36.0)
MCV: 82.9 fL (ref 80.0–100.0)
Platelets: 200 10*3/uL (ref 150–400)
RBC: 4.92 MIL/uL (ref 3.87–5.11)
RDW: 13.9 % (ref 11.5–15.5)
WBC: 7.8 10*3/uL (ref 4.0–10.5)
nRBC: 0 % (ref 0.0–0.2)

## 2022-05-31 LAB — URINALYSIS, ROUTINE W REFLEX MICROSCOPIC
Bacteria, UA: NONE SEEN
Bilirubin Urine: NEGATIVE
Glucose, UA: >=500 mg/dL — AB
Hgb urine dipstick: NEGATIVE
Ketones, ur: NEGATIVE mg/dL
Leukocytes,Ua: NEGATIVE
Nitrite: NEGATIVE
Protein, ur: 30 mg/dL — AB
Specific Gravity, Urine: 1.026 (ref 1.005–1.030)
pH: 5 (ref 5.0–8.0)

## 2022-05-31 LAB — LIPASE, BLOOD: Lipase: 26 U/L (ref 11–51)

## 2022-05-31 LAB — LACTIC ACID, PLASMA: Lactic Acid, Venous: 1.3 mmol/L (ref 0.5–1.9)

## 2022-05-31 MED ORDER — ONDANSETRON 4 MG PO TBDP
4.0000 mg | ORAL_TABLET | Freq: Once | ORAL | Status: AC | PRN
  Administered 2022-05-31: 4 mg via ORAL
  Filled 2022-05-31: qty 1

## 2022-05-31 MED ORDER — ONDANSETRON HCL 4 MG/2ML IJ SOLN
4.0000 mg | Freq: Once | INTRAMUSCULAR | Status: AC
  Administered 2022-05-31: 4 mg via INTRAVENOUS
  Filled 2022-05-31: qty 2

## 2022-05-31 MED ORDER — HYDROMORPHONE HCL 1 MG/ML IJ SOLN
0.5000 mg | Freq: Once | INTRAMUSCULAR | Status: AC
  Administered 2022-05-31: 0.5 mg via INTRAVENOUS
  Filled 2022-05-31: qty 1

## 2022-05-31 MED ORDER — LACTATED RINGERS IV BOLUS
1000.0000 mL | Freq: Once | INTRAVENOUS | Status: AC
  Administered 2022-05-31: 1000 mL via INTRAVENOUS

## 2022-05-31 NOTE — ED Provider Triage Note (Signed)
Emergency Medicine Provider Triage Evaluation Note  Morgan [name] , a 57 y.o. female  was evaluated in triage.  Pt complains of severe right flank pain and nausea that started 3 days ago.  States this feels similar to previous kidney stone.  She did not require intervention for the stone but passed at home.  She denies dysuria, hematuria, fever, chills, vomiting, or diarrhea.  She denies chest pain or shortness of breath.  Previous abdominal surgeries include C-section and hysterectomy.  Review of Systems  Positive: See HPI Negative: See HPI  Physical Exam  BP (!) 181/79 (BP Location: Right Arm)   Pulse (!) 58   Temp 98.3 F (36.8 C)   Resp 18   LMP 05/12/2011   SpO2 93%  Gen:   Awake, moderate distress secondary to pain Resp:  Normal effort  MSK:   Moves extremities without difficulty  Other:  Mild right lower quadrant tenderness, moderate right CVA tenderness, voluntary guarding  Medical Decision Making  Medically screening exam initiated at 5:27 PM.  Appropriate orders placed.  Morgan [name] was informed that the remainder of the evaluation will be completed by another provider, this initial triage assessment does not replace that evaluation, and the importance of remaining in the ED until their evaluation is complete.     Morgan Lederer, PA-C 05/31/22 1728

## 2022-05-31 NOTE — ED Triage Notes (Signed)
Patient here with complaint of right flank pain that started three days ago and feel similar to previous kidney stones. Denies hematuria, is alert, oriented, and in no apparent distress at this time.

## 2022-05-31 NOTE — ED Provider Notes (Signed)
[facility] Emergency Department Provider Note MRN:  [id number]  Arrival date & time: 06/01/22     Chief Complaint   Flank Pain   History of Present Illness   Morgan [name] is a 57 y.o. year-old female presents to the ED with chief complaint of low back for the past 3 days.  States that since then she has had worsening pain and now pain that radiates into her right groin and abdomen.  She reports nausea, but denies vomiting.  States that she has had kidney stone before and it felt similar.  Denies any urinary symptoms.  Denies fevers or chills.  Has tried Tylenol, Xanaflex, icy hot, and heating pad without relief.  History provided by patient.   Review of Systems  Pertinent positive and negative review of systems noted in HPI.    Physical Exam   Vitals:   06/01/22 0045 06/01/22 0100  BP: (!) 157/99 (!) 151/87  Pulse: 95 70  Resp: 18 12  Temp:    SpO2: 90% 96%    CONSTITUTIONAL:  well-appearing, NAD NEURO:  Alert and oriented x 3, CN 3-12 grossly intact EYES:  eyes equal and reactive ENT/NECK:  Supple, no stridor  CARDIO:  normal rate, regular rhythm, appears well-perfused  PULM:  No respiratory distress, CTAB GI/GU:  non-distended, RLQ TTP MSK/SPINE:  No gross deformities, no edema, moves all extremities  SKIN:  no rash, atraumatic   *Additional and/or pertinent findings included in MDM below  Diagnostic and Interventional Summary    EKG Interpretation  Date/Time:    Ventricular Rate:    PR Interval:    QRS Duration:   QT Interval:    QTC Calculation:   R Axis:     Text Interpretation:         Labs Reviewed  COMPREHENSIVE METABOLIC PANEL - Abnormal; Notable for the following components:      Result Value   Glucose, Bld 286 (*)    Albumin 3.4 (*)    All other components within normal limits  URINALYSIS, ROUTINE W REFLEX MICROSCOPIC - Abnormal; Notable for the following components:   APPearance HAZY (*)    Glucose, UA >=500  (*)    Protein, ur 30 (*)    All other components within normal limits  LIPASE, BLOOD  CBC  LACTIC ACID, PLASMA  LACTIC ACID, PLASMA    CT ABDOMEN PELVIS W CONTRAST  Final Result    CT ABDOMEN PELVIS WO CONTRAST  Final Result      Medications  ondansetron (ZOFRAN-ODT) disintegrating tablet 4 mg (4 mg Oral Given 05/31/22 1716)  HYDROmorphone (DILAUDID) injection 0.5 mg (0.5 mg Intravenous Given 05/31/22 2303)  ondansetron (ZOFRAN) injection 4 mg (4 mg Intravenous Given 05/31/22 2301)  lactated ringers bolus 1,000 mL (0 mLs Intravenous Stopped 06/01/22 0108)  iohexol (OMNIPAQUE) 350 MG/ML injection 75 mL (75 mLs Intravenous Contrast Given 06/01/22 0011)  HYDROmorphone (DILAUDID) injection 0.5 mg (0.5 mg Intravenous Given 06/01/22 0108)     Procedures  /  Critical Care Procedures  ED Course and Medical Decision Making  I have reviewed the triage vital signs, the nursing notes, and pertinent available records from the EMR.  Social Determinants Affecting Complexity of Care: Patient has no clinically significant social determinants affecting this chief complaint..   ED Course:    Medical Decision Making Patient here with right-sided flank and abdominal pain.  She has been having the symptoms for the past 3 days or so.  It is worsened with  palpation.  She denies any urinary symptoms.  Initially thought to be kidney stone and CT without contrast was ordered.  Patient does have bilateral nephrolithiasis, but no obstructing ureterolithiasis was seen.  Urinalysis is unremarkable.  The appendix was not visualized on the Noncon CT.  I do feel that given the location of the pain she should have a contrasted CT.  CT reassuring.  Patient's pain improved with treatment in the ED.  Will send home with some pain medicine and watchful waiting.  If symptoms change or worsen, she is advised to return to the emergency department.  Amount and/or Complexity of Data Reviewed Labs: ordered.    Details:  Normal lactic Normal lipase No significant leukocytosis or anemia Hyperglycemic, but no electrolyte derangement  Radiology: ordered and independent interpretation performed.    Details: No visible ureteral lithiasis  Risk Prescription drug management.     Consultants: No consultations were needed in caring for this patient.   Treatment and Plan: I considered admission due to patient's initial presentation, but after considering the examination and diagnostic results, patient will not require admission and can be discharged with outpatient follow-up.    Final Clinical Impressions(s) / ED Diagnoses     ICD-10-CM   1. Flank pain  R10.9     2. Right lower quadrant abdominal pain  R10.31       ED Discharge Orders          Ordered    oxyCODONE-acetaminophen (PERCOCET) 5-325 MG tablet  Every 6 hours PRN        06/01/22 0102    ondansetron (ZOFRAN-ODT) 4 MG disintegrating tablet  Every 8 hours PRN        06/01/22 0102              Discharge Instructions Discussed with and Provided to Patient:     Discharge Instructions      If your symptoms change or worsen, please return to the ER.         Roxy Horseman, PA-C 06/01/22 [id number]    Derwood Kaplan, MD 06/01/22 1601

## 2022-06-01 ENCOUNTER — Emergency Department (HOSPITAL_COMMUNITY): Payer: Federal, State, Local not specified - PPO

## 2022-06-01 MED ORDER — IOHEXOL 350 MG/ML SOLN
75.0000 mL | Freq: Once | INTRAVENOUS | Status: AC | PRN
  Administered 2022-06-01: 75 mL via INTRAVENOUS

## 2022-06-01 MED ORDER — OXYCODONE-ACETAMINOPHEN 5-325 MG PO TABS: 1.0000 | ORAL_TABLET | Freq: Four times a day (QID) | ORAL | 0 refills | Status: DC | PRN

## 2022-06-01 MED ORDER — ONDANSETRON 4 MG PO TBDP: 4.0000 mg | ORAL_TABLET | Freq: Three times a day (TID) | ORAL | 0 refills | Status: DC | PRN

## 2022-06-01 MED ORDER — HYDROMORPHONE HCL 1 MG/ML IJ SOLN
0.5000 mg | Freq: Once | INTRAMUSCULAR | Status: AC
  Administered 2022-06-01: 0.5 mg via INTRAVENOUS
  Filled 2022-06-01: qty 1

## 2022-06-01 NOTE — Discharge Instructions (Signed)
If your symptoms change or worsen, please return to the ER.   

## 2022-06-12 ENCOUNTER — Emergency Department (HOSPITAL_COMMUNITY)
Admission: EM | Admit: 2022-06-12 | Discharge: 2022-06-13 | Disposition: A | Discharge status: Home / Self Care | Payer: Federal, State, Local not specified - PPO | Attending: Emergency Medicine | Admitting: Emergency Medicine

## 2022-06-12 ENCOUNTER — Other Ambulatory Visit: Payer: Self-pay

## 2022-06-12 DIAGNOSIS — I10 Essential (primary) hypertension: Secondary | ICD-10-CM | POA: Insufficient documentation

## 2022-06-12 DIAGNOSIS — E1165 Type 2 diabetes mellitus with hyperglycemia: Secondary | ICD-10-CM | POA: Diagnosis not present

## 2022-06-12 DIAGNOSIS — Z8543 Personal history of malignant neoplasm of ovary: Secondary | ICD-10-CM | POA: Diagnosis not present

## 2022-06-12 DIAGNOSIS — T887XXA Unspecified adverse effect of drug or medicament, initial encounter: Secondary | ICD-10-CM | POA: Diagnosis not present

## 2022-06-12 DIAGNOSIS — R739 Hyperglycemia, unspecified: Secondary | ICD-10-CM

## 2022-06-12 LAB — BASIC METABOLIC PANEL
Anion gap: 10 (ref 5–15)
BUN: 13 mg/dL (ref 6–20)
CO2: 25 mmol/L (ref 22–32)
Calcium: 10 mg/dL (ref 8.9–10.3)
Chloride: 99 mmol/L (ref 98–111)
Creatinine, Ser: 0.83 mg/dL (ref 0.44–1.00)
GFR, Estimated: >60 mL/min (ref >60)
Glucose, Bld: 393 mg/dL — ABNORMAL HIGH (ref 70–99)
Potassium: 4.8 mmol/L (ref 3.5–5.1)
Sodium: 134 mmol/L — ABNORMAL LOW (ref 135–145)

## 2022-06-12 LAB — I-STAT VENOUS BLOOD GAS, ED
Acid-Base Excess: 3 mmol/L — ABNORMAL HIGH (ref 0.0–2.0)
Bicarbonate: 28.8 mmol/L — ABNORMAL HIGH (ref 20.0–28.0)
Calcium, Ion: 1.31 mmol/L (ref 1.15–1.40)
HCT: 45 % (ref 36.0–46.0)
Hemoglobin: 15.3 g/dL — ABNORMAL HIGH (ref 12.0–15.0)
O2 Saturation: 66 %
Potassium: 4.3 mmol/L (ref 3.5–5.1)
Sodium: 135 mmol/L (ref 135–145)
TCO2: 30 mmol/L (ref 22–32)
pCO2, Ven: 48.6 mmHg (ref 44–60)
pH, Ven: 7.381 (ref 7.25–7.43)
pO2, Ven: 35 mmHg (ref 32–45)

## 2022-06-12 LAB — CBC
HCT: 44.4 % (ref 36.0–46.0)
Hemoglobin: 15.1 g/dL — ABNORMAL HIGH (ref 12.0–15.0)
MCH: 27.1 pg (ref 26.0–34.0)
MCHC: 34 g/dL (ref 30.0–36.0)
MCV: 79.6 fL — ABNORMAL LOW (ref 80.0–100.0)
Platelets: 217 10*3/uL (ref 150–400)
RBC: 5.58 MIL/uL — ABNORMAL HIGH (ref 3.87–5.11)
RDW: 14.1 % (ref 11.5–15.5)
WBC: 8.5 10*3/uL (ref 4.0–10.5)
nRBC: 0 % (ref 0.0–0.2)

## 2022-06-12 LAB — BETA-HYDROXYBUTYRIC ACID: Beta-Hydroxybutyric Acid: 0.12 mmol/L (ref 0.05–0.27)

## 2022-06-12 NOTE — ED Provider Triage Note (Signed)
Emergency Medicine Provider Triage Evaluation Note  Morgan [name] , a 57 y.o. female  was evaluated in triage.  Pt complains of chief complaint of high blood sugar. Started a steroid burst for back pain 3 days ago On 10 units of novologue TID  Blood sugars in the 300-500s at home Comes in today for further care and a management Review of Systems  Positive: Nausea/fatigue/headache Negative:   Physical Exam  BP (!) 178/86 (BP Location: Left Arm)   Pulse 85   Temp 98.3 F (36.8 C) (Oral)   Resp 14   LMP 05/12/2011   SpO2 98%  Gen:   Awake, no distress   Resp:  Normal effort  MSK:   Moves extremities without difficulty  Other:    Medical Decision Making  Medically screening exam initiated at 9:42 PM.  Appropriate orders placed.  Morgan [name] was informed that the remainder of the evaluation will be completed by another provider, this initial triage assessment does not replace that evaluation, and the importance of remaining in the ED until their evaluation is complete.     Glyn Ade, MD 06/12/22 [phone]

## 2022-06-12 NOTE — ED Triage Notes (Signed)
BIB EMS from home  Hyperglycemia. Recently started on prednisone.  Stopped yesterday d/t blood sugars.  BP 182/98.  20G RAC.  CBG 381  4 mg of zofran given.

## 2022-06-13 NOTE — ED Provider Notes (Signed)
[facility] Emergency Department Provider Note MRN:  [id number]  Arrival date & time: 06/13/22     Chief Complaint   Hyperglycemia   History of Present Illness   Morgan [name] is a 57 y.o. year-old female with a history of A-fib, diabetes presenting to the ED with chief complaint of hyperglycemia.  Patient explains that she was prescribed prednisone for a flare of her back pain.  Has degenerative disc disease.  Started having worsening back pain few days ago.  Denies any numbness or weakness to the arms or legs, no bowel or bladder dysfunction.  After starting the prednisone she noticed her blood sugars rising.  Up to 400 at home.  Feeling malaise and fatigue.  Has been drinking a lot of water.  Review of Systems  A thorough review of systems was obtained and all systems are negative except as noted in the HPI and PMH.   Patient's Health History    Past Medical History:  Diagnosis Date   Anxiety    Atrial fibrillation (HCC)    Bipolar disorder (HCC)    Breast mass    right, removed and benign   Cancer (HCC) 09/16/2019   ovarian ca no radiation no chemo   Cervical dysplasia    Diabetes mellitus without complication (HCC)    Endometriosis    GERD (gastroesophageal reflux disease)    Hypercholesteremia    Hypertension    Migraine    Ovarian cancer (HCC)    Pelvic kidney    left   Postpartum depression    hx of    Past Surgical History:  Procedure Laterality Date   ABDOMINAL HYSTERECTOMY N/A    Phreesia 07/30/2019   BACK SURGERY  02/16/2012   BREAST BIOPSY Right 2016   BREAST BIOPSY Right 2020   BREAST EXCISIONAL BIOPSY Right 2021   BREAST EXCISIONAL BIOPSY Right 2016   benign   BREAST LUMPECTOMY WITH RADIOACTIVE SEED LOCALIZATION Right 10/28/2014   Procedure: RIGHT BREAST LUMPECTOMY WITH RADIOACTIVE SEED LOCALIZATION;  Surgeon: Chevis Pretty III, MD;  Location: [facility];  Service: General;  Laterality: Right;   BREAST  LUMPECTOMY WITH RADIOACTIVE SEED LOCALIZATION Right 06/18/2019   Procedure: RIGHT BREAST RADIOACTIVE SEED LOCALIZATION LUMPECTOMY;  Surgeon: Griselda Miner, MD;  Location: [facility];  Service: General;  Laterality: Right;   BREAST SURGERY N/A    Phreesia 07/30/2019   CESAREAN SECTION  02/15/2001   COLONOSCOPY WITH PROPOFOL N/A 05/01/2018   Procedure: COLONOSCOPY WITH PROPOFOL;  Surgeon: Wyline Mood, MD;  Location: [facility] ENDOSCOPY;  Service: Gastroenterology;  Laterality: N/A;   COLPOSCOPY     ESOPHAGOGASTRODUODENOSCOPY (EGD) WITH PROPOFOL N/A 05/01/2018   Procedure: ESOPHAGOGASTRODUODENOSCOPY (EGD) WITH PROPOFOL;  Surgeon: Wyline Mood, MD;  Location: [facility] ENDOSCOPY;  Service: Gastroenterology;  Laterality: N/A;   ESOPHAGOGASTRODUODENOSCOPY (EGD) WITH PROPOFOL N/A 11/13/2018   Procedure: ESOPHAGOGASTRODUODENOSCOPY (EGD) WITH PROPOFOL;  Surgeon: Wyline Mood, MD;  Location[facility]COPY;  Service: Gastroenterology;  Laterality: N/A;   GYNECOLOGIC CRYOSURGERY     NECK SURGERY  02/15/2010   PELVIC LAPAROSCOPY  02/15/1989    Family History  Problem Relation Age of Onset   Diabetes Mother    Breast cancer Mother 28   Thyroid disease Mother    Depression Mother    Hypertension Father    Heart disease Father    Heart attack Father 75       Two instances, first at age 5   Stroke Father    Alcohol abuse  Father    Throat cancer Father    Diabetes Maternal Grandmother    Depression Maternal Grandmother    Uterine cancer Maternal Grandmother     Social History   Socioeconomic History   Marital status: Married    Spouse name: Not on file   Number of children: Not on file   Years of education: Not on file   Highest education level: Not on file  Occupational History   Not on file  Tobacco Use   Smoking status: Never   Smokeless tobacco: Never   Tobacco comments:    Never smoke 07/01/21  Vaping Use   Vaping Use: Never used  Substance and Sexual Activity   Alcohol  use: No   Drug use: No   Sexual activity: Yes    Partners: Male    Birth control/protection: None    Comment: vasectomy  Other Topics Concern   Not on file  Social History Narrative   Lives in [place] with spouse and son.   Disabled.   Social Determinants of Health   Financial Resource Strain: Not on file  Food Insecurity: No Food Insecurity (04/29/2022)   Hunger Vital Sign    Worried About Running Out of Food in the Last Year: Never true    Ran Out of Food in the Last Year: Never true  Transportation Needs: Not on file  Physical Activity: Not on file  Stress: Not on file  Social Connections: Not on file  Intimate Partner Violence: Not At Risk (04/29/2022)   Humiliation, Afraid, Rape, and Kick questionnaire    Fear of Current or Ex-Partner: No    Emotionally Abused: No    Physically Abused: No    Sexually Abused: No     Physical Exam   Vitals:   06/12/22 2136  BP: (!) 178/86  Pulse: 85  Resp: 14  Temp: 98.3 F (36.8 C)  SpO2: 98%    CONSTITUTIONAL: Well-appearing, NAD NEURO/PSYCH:  Alert and oriented x 3, no focal deficits EYES:  eyes equal and reactive ENT/NECK:  no LAD, no JVD CARDIO: Regular rate, well-perfused, normal S1 and S2 PULM:  CTAB no wheezing or rhonchi GI/GU:  non-distended, non-tender MSK/SPINE:  No gross deformities, no edema SKIN:  no rash, atraumatic   *Additional and/or pertinent findings included in MDM below  Diagnostic and Interventional Summary    EKG Interpretation  Date/Time:    Ventricular Rate:    PR Interval:    QRS Duration:   QT Interval:    QTC Calculation:   R Axis:     Text Interpretation:         Labs Reviewed  BASIC METABOLIC PANEL - Abnormal; Notable for the following components:      Result Value   Sodium 134 (*)    Glucose, Bld 393 (*)    All other components within normal limits  CBC - Abnormal; Notable for the following components:   RBC 5.58 (*)    Hemoglobin 15.1 (*)    MCV 79.6 (*)    All other  components within normal limits  I-STAT VENOUS BLOOD GAS, ED - Abnormal; Notable for the following components:   Bicarbonate 28.8 (*)    Acid-Base Excess 3.0 (*)    Hemoglobin 15.3 (*)    All other components within normal limits  BETA-HYDROXYBUTYRIC ACID  URINALYSIS, ROUTINE W REFLEX MICROSCOPIC  BLOOD GAS, VENOUS    No orders to display    Medications - No data to display   Procedures  /  Critical Care Procedures  ED Course and Medical Decision Making  Initial Impression and Ddx Well-appearing with normal vital signs, no neurological deficits on exam, soft abdomen, no fever.  History consistent with medication side effect causing the hyperglycemia.  DKA also considered.  Past medical/surgical history that increases complexity of ED encounter: Diabetes  Interpretation of Diagnostics I personally reviewed the laboratory assessment and my interpretation is as follows: Hyperglycemia with no metabolic disturbance.  Normal blood counts.    Patient Reassessment and Ultimate Disposition/Management     Patient provided with reassurance, will continue addressing her blood sugar at home with her home insulin.  Patient management required discussion with the following services or consulting groups:  None  Complexity of Problems Addressed Acute complicated illness or Injury  Additional Data Reviewed and Analyzed Further history obtained from: None  Additional Factors Impacting ED Encounter Risk None  Elmer Sow. Pilar Plate, MD [facility] Emergency Medicine [facility] [email or web address]  Final Clinical Impressions(s) / ED Diagnoses     ICD-10-CM   1. Hyperglycemia  R73.9     2. Medication side effect  [email or web address]       ED Discharge Orders     None        Discharge Instructions Discussed with and Provided to Patient:    Discharge Instructions      You were evaluated in the Emergency Department and after careful evaluation, we did not find any  emergent condition requiring admission or further testing in the hospital.  Your exam/testing today is overall reassuring.  Symptoms seem to be due to high blood sugar as result of the prednisone.  Continue what you are doing, stop the prednisone, keep an eye on your blood sugars as we discussed.  Please return to the Emergency Department if you experience any worsening of your condition.   Thank you for allowing [place] to be a part of your care.      Sabas Sous, MD 06/13/22 [phone]

## 2022-06-13 NOTE — Discharge Instructions (Signed)
You were evaluated in the Emergency Department and after careful evaluation, we did not find any emergent condition requiring admission or further testing in the hospital.  Your exam/testing today is overall reassuring.  Symptoms seem to be due to high blood sugar as result of the prednisone.  Continue what you are doing, stop the prednisone, keep an eye on your blood sugars as we discussed.  Please return to the Emergency Department if you experience any worsening of your condition.   Thank you for allowing [place] to be a part of your care.

## 2022-06-14 ENCOUNTER — Other Ambulatory Visit: Payer: Self-pay | Admitting: Nurse Practitioner

## 2022-06-14 DIAGNOSIS — Z794 Long term (current) use of insulin: Secondary | ICD-10-CM

## 2022-06-23 ENCOUNTER — Ambulatory Visit (INDEPENDENT_AMBULATORY_CARE_PROVIDER_SITE_OTHER): Payer: Federal, State, Local not specified - PPO | Admitting: Family Medicine

## 2022-06-23 ENCOUNTER — Encounter: Payer: Self-pay | Admitting: Family Medicine

## 2022-06-23 VITALS — BP 148/89 | HR 77 | Resp 18 | Ht 61.0 in | Wt 189.0 lb

## 2022-06-23 DIAGNOSIS — Z23 Encounter for immunization: Secondary | ICD-10-CM | POA: Diagnosis not present

## 2022-06-23 DIAGNOSIS — I10 Essential (primary) hypertension: Secondary | ICD-10-CM

## 2022-06-23 DIAGNOSIS — R319 Hematuria, unspecified: Secondary | ICD-10-CM

## 2022-06-23 DIAGNOSIS — F319 Bipolar disorder, unspecified: Secondary | ICD-10-CM | POA: Diagnosis not present

## 2022-06-23 DIAGNOSIS — E785 Hyperlipidemia, unspecified: Secondary | ICD-10-CM

## 2022-06-23 DIAGNOSIS — E1169 Type 2 diabetes mellitus with other specified complication: Secondary | ICD-10-CM

## 2022-06-23 DIAGNOSIS — I480 Paroxysmal atrial fibrillation: Secondary | ICD-10-CM

## 2022-06-23 DIAGNOSIS — Z794 Long term (current) use of insulin: Secondary | ICD-10-CM

## 2022-06-23 DIAGNOSIS — Z1159 Encounter for screening for other viral diseases: Secondary | ICD-10-CM

## 2022-06-23 DIAGNOSIS — Z6833 Body mass index (BMI) 33.0-33.9, adult: Secondary | ICD-10-CM

## 2022-06-23 LAB — POCT URINALYSIS DIPSTICK
Bilirubin, UA: NEGATIVE
Blood, UA: NEGATIVE
Glucose, UA: NEGATIVE
Ketones, UA: NEGATIVE
Leukocytes, UA: NEGATIVE
Nitrite, UA: NEGATIVE
Protein, UA: POSITIVE — AB
Spec Grav, UA: 1.025 (ref 1.010–1.025)
Urobilinogen, UA: 0.2 E.U./dL
pH, UA: 5.5 (ref 5.0–8.0)

## 2022-06-23 LAB — POCT UA - MICROALBUMIN
Albumin/Creatinine Ratio, Urine, POC: >300
Creatinine, POC: 100 mg/dL
Microalbumin Ur, POC: 150 mg/L

## 2022-06-23 MED ORDER — DAPAGLIFLOZIN PRO-METFORMIN ER 10-1000 MG PO TB24
1.0000 | ORAL_TABLET | Freq: Every day | ORAL | 1 refills | Status: DC
Start: 2022-06-23 — End: 2023-04-07

## 2022-06-23 NOTE — Assessment & Plan Note (Signed)
Encourage regular physical activity including walking as well as making efforts to balance blood sugar through lifestyle interventions in addition to medication.

## 2022-06-23 NOTE — Assessment & Plan Note (Signed)
Blood pressure elevated 159/89 on intake, 148/89 on repeat.  It is possible that blood pressure has been influenced by prednisone and/or back pain some.  We discussed the importance of medication compliance as well as routine physical activity and limiting the sodium in her diet.  Encouraged her to check her blood pressure once a day about 2-3 hours after taking her medication per cardiology's recommendation and ensure that it is less than 130 on the top and less than 80 on the bottom.  If it is not, then we will need to consider changing her medication routine.  Cardiology visit 04/08/2022 decreased losartan to 50 mg daily instructed to follow-up in 6 months.  ED follow-up visit on 05/04/2022 after calling EMS for blood pressure readings in the 50s through 70s.  Recommended restarting diltiazem 120 mg daily.  Blood pressure log at home 2 weeks after that appointment showed varying blood pressure and heart rate.  Cardiology recommended switching to metoprolol tartrate 12.5 mg twice daily and rechecking blood pressure and heart rate about 2 to 3 hours after medication once daily.  I am going to send a message to cardiology to see what their recommendations are at this point and if they would like to make any changes at this time.  She does have an upcoming appointment with the atrial fibrillation clinic on 06/28/2021.  Follow-up with cardiology was not recommended until 6 months after her last appointment.

## 2022-06-23 NOTE — Assessment & Plan Note (Addendum)
Last lipid panel: LDL 80, HDL 39, triglycerides 121.  Continue rosuvastatin 10 mg daily, heart healthy diet, and routine physical activity.

## 2022-06-23 NOTE — Patient Instructions (Signed)
For your belly pain, try taking MiraLAX once a day for about a week or so.  It is also important to stay hydrated.  It may also be beneficial if you are able to walk even 10 minutes after eating a meal to help your digestive system move everything.  Continue monitoring your blood pressure and blood sugars at home.  Give your carbs a friend! It can be very helpful when balancing your blood sugars to add a protein and/or some fiber to a sugary/high carb snack.  This could be eating something like some yogurt for protein or fruits/vegetables for fiber when you have a sweet treat.

## 2022-06-23 NOTE — Progress Notes (Signed)
Complete physical exam  Patient: Morgan [name]   DOB: 1965-08-06   56 y.o. Female  MRN: [id number]  Subjective:    Chief Complaint  Patient presents with   Annual Exam    Morgan [name] is a 57 y.o. female who presents today for a complete physical exam. She reports consuming a general diet. The patient does not participate in regular exercise at present. She generally feels fairly well.  She does not have additional problems to discuss today.  She has not been able to get her Davonna Belling since January and has only been taking her NovoLog insulin 10 units 3 times a day for blood sugar control.  Additionally, she was started on prednisone for her degenerative disc disease.  She only took 1 day worth, but her blood sugars shot up.  She did not take anymore prednisone, that was 2 weeks ago.  Blood sugars at home have generally been in the 300s and 400s.   Most recent fall risk assessment:    06/23/2022    9:44 AM  Fall Risk   Falls in the past year? 0  Number falls in past yr: 0  Injury with Fall? 0  Risk for fall due to : No Fall Risks  Follow up Falls evaluation completed     Most recent depression and anxiety screenings:    06/23/2022    9:43 AM 03/25/2022    2:47 PM  PHQ 2/9 Scores  PHQ - 2 Score 2 1  PHQ- 9 Score 7 8      06/23/2022    9:44 AM 03/25/2022    2:48 PM 10/15/2021    1:49 PM 06/24/2021   10:55 AM  GAD 7 : Generalized Anxiety Score  Nervous, Anxious, on Edge 0 1 2 1   Control/stop worrying 0 1 2 1   Worry too much - different things 1 1 2 1   Trouble relaxing 1 1 2 1   Restless 1 0 2 0  Easily annoyed or irritable 0 1 2 0  Afraid - awful might happen 0 0 1 0  Total GAD 7 Score 3 5 13 4   Anxiety Difficulty Somewhat difficult  Very difficult Somewhat difficult    Patient Active Problem List   Diagnosis Date Noted   Pressure injury of skin 04/29/2022   Shock (HCC) 04/29/2022   Anticoagulated 03/02/2022   Ischemic colitis (HCC) 03/02/2022   Paroxysmal atrial  fibrillation (HCC) 12/31/2020   Hypercoagulable state due to paroxysmal atrial fibrillation (HCC) 12/31/2020   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   History of endometrial hyperplasia 08/12/2019   Fatty liver 08/12/2019   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   Body mass index (BMI) 33.0-33.9, adult 05/11/2019   Cervical dysplasia 04/18/2019   Other chronic pain 03/16/2019   Lumbago with sciatica, right side 03/16/2019   Low back pain 03/16/2019   Type 2 diabetes mellitus with other specified complication (HCC) 06/14/2017   Hyperlipidemia associated with type 2 diabetes mellitus (HCC) 06/14/2017   Essential hypertension 06/14/2017   Displacement of lumbar intervertebral disc without myelopathy 09/20/2012   Bipolar disorder (HCC)     Past Surgical History:  Procedure Laterality Date   ABDOMINAL HYSTERECTOMY N/A    Phreesia 07/30/2019   BACK SURGERY  02/16/2012   BREAST BIOPSY Right 2016   BREAST BIOPSY Right 2020   BREAST EXCISIONAL BIOPSY Right 2021   BREAST EXCISIONAL BIOPSY Right 2016   benign   BREAST LUMPECTOMY WITH RADIOACTIVE SEED LOCALIZATION Right 10/28/2014  Procedure: RIGHT BREAST LUMPECTOMY WITH RADIOACTIVE SEED LOCALIZATION;  Surgeon: Chevis Pretty III, MD;  Location: [facility];  Service: General;  Laterality: Right;   BREAST LUMPECTOMY WITH RADIOACTIVE SEED LOCALIZATION Right 06/18/2019   Procedure: RIGHT BREAST RADIOACTIVE SEED LOCALIZATION LUMPECTOMY;  Surgeon: Griselda Miner, MD;  Location: [facility];  Service: General;  Laterality: Right;   BREAST SURGERY N/A    Phreesia 07/30/2019   CESAREAN SECTION  02/15/2001   COLONOSCOPY WITH PROPOFOL N/A 05/01/2018   Procedure: COLONOSCOPY WITH PROPOFOL;  Surgeon: Wyline Mood, MD;  Location: [facility] ENDOSCOPY;  Service: Gastroenterology;  Laterality: N/A;   COLPOSCOPY     ESOPHAGOGASTRODUODENOSCOPY (EGD) WITH PROPOFOL N/A 05/01/2018   Procedure: ESOPHAGOGASTRODUODENOSCOPY (EGD)  WITH PROPOFOL;  Surgeon: Wyline Mood, MD;  Location: [facility] ENDOSCOPY;  Service: Gastroenterology;  Laterality: N/A;   ESOPHAGOGASTRODUODENOSCOPY (EGD) WITH PROPOFOL N/A 11/13/2018   Procedure: ESOPHAGOGASTRODUODENOSCOPY (EGD) WITH PROPOFOL;  Surgeon: Wyline Mood, MD;  Location: [facility] ENDOSCOPY;  Service: Gastroenterology;  Laterality: N/A;   GYNECOLOGIC CRYOSURGERY     NECK SURGERY  02/15/2010   PELVIC LAPAROSCOPY  02/15/1989   Social History   Tobacco Use   Smoking status: Never    Passive exposure: Never   Smokeless tobacco: Never   Tobacco comments:    Never smoke 07/01/21  Vaping Use   Vaping Use: Never used  Substance Use Topics   Alcohol use: No   Drug use: No   Family History  Problem Relation Age of Onset   Diabetes Mother    Breast cancer Mother 68   Thyroid disease Mother    Depression Mother    Hypertension Father    Heart disease Father    Heart attack Father 3       Two instances, first at age 63   Stroke Father    Alcohol abuse Father    Throat cancer Father    Diabetes Maternal Grandmother    Depression Maternal Grandmother    Uterine cancer Maternal Grandmother    Allergies  Allergen Reactions   Clindamycin/Lincomycin Rash    Diffuse drug reaction eruption   Lamictal [Lamotrigine] Rash   Banana Diarrhea   Lipitor [Atorvastatin] Other (See Comments)    Severe fatigue   Pineapple Diarrhea     Patient Care Team: [name], PA as PCP - General (Family Medicine) Corky Crafts, MD as PCP - Cardiology (Cardiology) Barnett Abu, MD as Consulting Physician (Neurosurgery) Wyline Mood, MD as Consulting Physician (Gastroenterology) Theresia Majors, MD (Inactive) as Consulting Physician (Obstetrics and Gynecology) Will Bonnet, MD as Referring Physician (Oncology)   Outpatient Medications Prior to Visit  Medication Sig   acetaminophen (TYLENOL) 500 MG tablet Take 500 mg by mouth every 6 (six) hours as needed for mild pain.    apixaban (ELIQUIS) 5 MG TABS tablet Take 1 tablet (5 mg total) by mouth 2 (two) times daily.   ARIPiprazole (ABILIFY) 15 MG tablet Take 15 mg by mouth at bedtime.   DULoxetine (CYMBALTA) 60 MG capsule Take 60 mg by mouth at bedtime.   fluticasone (FLONASE) 50 MCG/ACT nasal spray Place 1 spray into both nostrils daily as needed for allergies.   Insulin Aspart FlexPen (NOVOLOG) 100 UNIT/ML INJECT 10 UNITS INTO THE SKIN BEFORE BREAKFAST AND LUNCH. INJECT 13 UNITS INTO THE SKIN BEFORE SUPPER.   lidocaine (LIDODERM) 5 % Place 1 patch onto the skin daily as needed (pain).   metoprolol tartrate (LOPRESSOR) 25 MG tablet Take 0.5 tablets (12.5 mg total)  by mouth 2 (two) times daily.   omeprazole (PRILOSEC OTC) 20 MG tablet Take 20 mg by mouth at bedtime.   oxyCODONE-acetaminophen (PERCOCET) 5-325 MG tablet Take 1-2 tablets by mouth every 6 (six) hours as needed.   rosuvastatin (CRESTOR) 10 MG tablet Take 1 tablet (10 mg total) by mouth daily.   tiZANidine (ZANAFLEX) 4 MG tablet Take 4 mg by mouth at bedtime as needed for muscle spasms.   [DISCONTINUED] ALPRAZOLAM XR 1 MG 24 hr tablet Take 1 mg by mouth daily as needed for anxiety. (Patient not taking: Reported on 06/12/2022)   [DISCONTINUED] Dapagliflozin Pro-metFORMIN ER (XIGDUO XR) 11-998 MG TB24 Take 1 tablet by mouth at bedtime. (Patient not taking: Reported on 06/12/2022)   [DISCONTINUED] ondansetron (ZOFRAN-ODT) 4 MG disintegrating tablet Take 1 tablet (4 mg total) by mouth every 8 (eight) hours as needed for nausea or vomiting. (Patient not taking: Reported on 06/12/2022)   No facility-administered medications prior to visit.    Review of Systems  Constitutional:  Negative for chills, fever and malaise/fatigue.  HENT:  Negative for congestion and hearing loss.   Eyes:  Negative for blurred vision and double vision.  Respiratory:  Negative for cough and shortness of breath.   Cardiovascular:  Negative for chest pain, palpitations and leg swelling.   Gastrointestinal:  Positive for abdominal pain (Lower abdominal pain), blood in stool (On toilet paper) and constipation. Negative for diarrhea and heartburn.  Genitourinary:  Negative for frequency and urgency.  Musculoskeletal:  Negative for myalgias and neck pain.  Neurological:  Negative for headaches.  Endo/Heme/Allergies:  Negative for polydipsia.  Psychiatric/Behavioral:  Negative for depression. The patient is not nervous/anxious.      Objective:    BP (!) 148/89 (BP Location: Right Arm, Patient Position: Sitting, Cuff Size: Normal)   Pulse 77   Resp 18   Ht 5\' 1"  (1.549 m)   Wt 189 lb (85.7 kg)   LMP 05/12/2011   SpO2 97%   BMI 35.71 kg/m    Physical Exam Constitutional:      General: She is not in acute distress.    Appearance: Normal appearance.  HENT:     Head: Normocephalic and atraumatic.     Right Ear: Tympanic membrane, ear canal and external ear normal. There is no impacted cerumen.     Left Ear: Tympanic membrane, ear canal and external ear normal. There is no impacted cerumen.     Nose: Nose normal. No rhinorrhea.     Mouth/Throat:     Mouth: Mucous membranes are moist.     Pharynx: No oropharyngeal exudate or posterior oropharyngeal erythema.  Eyes:     Extraocular Movements: Extraocular movements intact.     Conjunctiva/sclera: Conjunctivae normal.     Pupils: Pupils are equal, round, and reactive to light.  Neck:     Thyroid: No thyroid mass, thyromegaly or thyroid tenderness.  Cardiovascular:     Rate and Rhythm: Normal rate and regular rhythm.     Heart sounds: Normal heart sounds.  Pulmonary:     Effort: Pulmonary effort is normal.     Breath sounds: Normal breath sounds.  Abdominal:     General: Abdomen is flat. Bowel sounds are normal.     Palpations: There is no mass.     Tenderness: There is abdominal tenderness (LRQ, LLQ). There is no guarding or rebound.  Musculoskeletal:        General: No swelling. Normal range of motion.      Cervical  back: Normal range of motion and neck supple.     Right lower leg: No edema.     Left lower leg: No edema.  Skin:    General: Skin is warm and dry.  Neurological:     Mental Status: She is alert and oriented to person, place, and time.     Cranial Nerves: No cranial nerve deficit.     Motor: No weakness.     Deep Tendon Reflexes: Reflexes normal.  Psychiatric:        Mood and Affect: Mood normal.     Results for orders placed or performed in visit on 06/23/22  POCT UA - Microalbumin  Result Value Ref Range   Microalbumin Ur, POC 150 mg/L   Creatinine, POC 100 mg/dL   Albumin/Creatinine Ratio, Urine, POC >300   POCT Urinalysis Dipstick  Result Value Ref Range   Color, UA Yellow    Clarity, UA Clear    Glucose, UA Negative Negative   Bilirubin, UA Negative    Ketones, UA Negative    Spec Grav, UA 1.025 1.010 - 1.025   Blood, UA Negative    pH, UA 5.5 5.0 - 8.0   Protein, UA Positive (A) Negative   Urobilinogen, UA 0.2 0.2 or 1.0 E.U./dL   Nitrite, UA Negative    Leukocytes, UA Negative Negative   Appearance     Odor     Urinalysis negative for UTI but did show protein in the urine.  This is consistent with the increased albumin/creatinine ratio which indicates decreased kidney function.  This is likely secondary to recent increase in blood sugars and blood pressure.  We will continue to monitor this, hopefully it will improve as control of blood sugar and blood pressure improves.      Assessment & Plan:    Routine Health Maintenance and Physical Exam  Immunization History  Administered Date(s) Administered   Hep A / Hep B 08/10/2018   Pneumococcal Polysaccharide-23 12/06/2018   Tdap 03/16/2016   Zoster Recombinat (Shingrix) 06/23/2022    Health Maintenance  Topic Date Due   COVID-19 Vaccine (1) Never done   Hepatitis C Screening  Never done   FOOT EXAM  04/07/2022   Zoster Vaccines- Shingrix (2 of 2) 08/18/2022   HEMOGLOBIN A1C  09/14/2022   INFLUENZA  VACCINE  09/16/2022   OPHTHALMOLOGY EXAM  04/22/2023   COLONOSCOPY (Pts 45-67yrs Insurance coverage will need to be confirmed)  05/01/2023   Diabetic kidney evaluation - eGFR measurement  06/12/2023   Diabetic kidney evaluation - Urine ACR  06/23/2023   MAMMOGRAM  12/02/2023   PAP SMEAR-Modifier  05/21/2024   DTaP/Tdap/Td (2 - Td or Tdap) 03/16/2026   HIV Screening  Completed   HPV VACCINES  Aged Out    Discussed health benefits of physical activity, and encouraged her to engage in regular exercise appropriate for her age and condition.  Essential hypertension Assessment & Plan: Blood pressure elevated 159/89 on intake, 148/89 on repeat.  It is possible that blood pressure has been influenced by prednisone and/or back pain some.  We discussed the importance of medication compliance as well as routine physical activity and limiting the sodium in her diet.  Encouraged her to check her blood pressure once a day about 2-3 hours after taking her medication per cardiology's recommendation and ensure that it is less than 130 on the top and less than 80 on the bottom.  If it is not, then we will need to consider changing her  medication routine.  Cardiology visit 04/08/2022 decreased losartan to 50 mg daily instructed to follow-up in 6 months.  ED follow-up visit on 05/04/2022 after calling EMS for blood pressure readings in the 50s through 70s.  Recommended restarting diltiazem 120 mg daily.  Blood pressure log at home 2 weeks after that appointment showed varying blood pressure and heart rate.  Cardiology recommended switching to metoprolol tartrate 12.5 mg twice daily and rechecking blood pressure and heart rate about 2 to 3 hours after medication once daily.  I am going to send a message to cardiology to see what their recommendations are at this point and if they would like to make any changes at this time.  She does have an upcoming appointment with the atrial fibrillation clinic on 06/28/2021.   Follow-up with cardiology was not recommended until 6 months after her last appointment.  Orders: -     CBC with Differential/Platelet; Future -     Comprehensive metabolic panel; Future  Type 2 diabetes mellitus with other specified complication, with long-term current use of insulin [facility]) Assessment & Plan: Patient brought glucose monitor to office visit today, I personally reviewed it.  At home values starting in April in the 300s and 400s.  Prior to that, began creeping up to the high 100s after January which coincides with when she took the last dose of her Xigduo.  I suspect that her A1c will likely be high given that she has not had her Xigduo in conjunction with the prednisone.  Restarting Xigduo 11-998 mg daily at bedtime, continue NovoLog 10 units 3 times a day.  We also discussed other ways to manage blood sugar including pairing carbohydrate rich foods with protein and fiber to decrease her blood sugar spike as well as finding time to walk throughout the day which can be helpful for both blood sugar and constipation.  Patient verbalized understanding and is agreeable with this plan.  We will follow-up in 3 months and check a point-of-care A1c to assess her progress. Urine test revealed increased albumin/creatinine ratio.  This is likely secondary to hyperglycemia. She will be due for foot exam at that time.  We will also need to get a copy of her annual diabetic eye exam.  Orders: -     POCT UA - Microalbumin -     CBC with Differential/Platelet; Future -     Comprehensive metabolic panel; Future -     Hemoglobin A1c; Future -     Lipid panel; Future -     TSH; Future -     T4, free; Future -     Dapagliflozin Pro-metFORMIN ER; Take 1 tablet by mouth at bedtime.  Dispense: 90 tablet; Refill: 1  Hyperlipidemia associated with type 2 diabetes mellitus (HCC) Assessment & Plan: Last lipid panel: LDL 80, HDL 39, triglycerides 121.  Continue rosuvastatin 10 mg daily, heart healthy  diet, and routine physical activity.  Orders: -     Lipid panel; Future  Bipolar affective disorder, remission status unspecified (HCC) Assessment & Plan: Continue Abilify 15 mg daily, Cymbalta 60 mg daily.  Will continue to monitor.   Hematuria, unspecified type -     POCT urinalysis dipstick  Body mass index (BMI) 33.0-33.9, adult Assessment & Plan: Encourage regular physical activity including walking as well as making efforts to balance blood sugar through lifestyle interventions in addition to medication.  Orders: -     CBC with Differential/Platelet; Future -     Comprehensive metabolic panel; Future -  Hemoglobin A1c; Future -     Lipid panel; Future -     TSH; Future -     T4, free; Future  Screening for viral disease -     Hepatitis C antibody; Future  Paroxysmal atrial fibrillation [facility]) Assessment & Plan: Followed by cardiology/atrial fibrillation clinic.  Continue metoprolol tartrate 12.5 mg twice daily as recommended by cardiology.  Next A-fib clinic appointment on 06/29/2022.   Need for shingles vaccine -     Varicella-zoster vaccine IM  Patient is agreeable to hepatitis C screening, will collect with next round of lab work.  Return in about 3 months (around 09/23/2022) for follow-up for HTN, HLD, DM; repeat POC A1C.     [name], PA

## 2022-06-23 NOTE — Assessment & Plan Note (Signed)
Followed by cardiology/atrial fibrillation clinic.  Continue metoprolol tartrate 12.5 mg twice daily as recommended by cardiology.  Next A-fib clinic appointment on 06/29/2022.

## 2022-06-23 NOTE — Assessment & Plan Note (Signed)
Patient brought glucose monitor to office visit today, I personally reviewed it.  At home values starting in April in the 300s and 400s.  Prior to that, began creeping up to the high 100s after January which coincides with when she took the last dose of her Xigduo.  I suspect that her A1c will likely be high given that she has not had her Xigduo in conjunction with the prednisone.  Restarting Xigduo 11-998 mg daily at bedtime, continue NovoLog 10 units 3 times a day.  We also discussed other ways to manage blood sugar including pairing carbohydrate rich foods with protein and fiber to decrease her blood sugar spike as well as finding time to walk throughout the day which can be helpful for both blood sugar and constipation.  Patient verbalized understanding and is agreeable with this plan.  We will follow-up in 3 months and check a point-of-care A1c to assess her progress. Urine test revealed increased albumin/creatinine ratio.  This is likely secondary to hyperglycemia. She will be due for foot exam at that time.  We will also need to get a copy of her annual diabetic eye exam.

## 2022-06-23 NOTE — Assessment & Plan Note (Signed)
Continue Abilify 15 mg daily, Cymbalta 60 mg daily.  Will continue to monitor.

## 2022-06-24 LAB — COMPREHENSIVE METABOLIC PANEL
ALT: 37 IU/L — ABNORMAL HIGH (ref 0–32)
AST: 32 IU/L (ref 0–40)
Albumin/Globulin Ratio: 1.4 (ref 1.2–2.2)
Albumin: 4.2 g/dL (ref 3.8–4.9)
Alkaline Phosphatase: 160 IU/L — ABNORMAL HIGH (ref 44–121)
BUN/Creatinine Ratio: 11 (ref 9–23)
BUN: 6 mg/dL (ref 6–24)
Bilirubin Total: 0.6 mg/dL (ref 0.0–1.2)
CO2: 20 mmol/L (ref 20–29)
Calcium: 9.5 mg/dL (ref 8.7–10.2)
Chloride: 96 mmol/L (ref 96–106)
Creatinine, Ser: 0.56 mg/dL — ABNORMAL LOW (ref 0.57–1.00)
Globulin, Total: 2.9 g/dL (ref 1.5–4.5)
Glucose: 332 mg/dL — ABNORMAL HIGH (ref 70–99)
Potassium: 3.9 mmol/L (ref 3.5–5.2)
Sodium: 134 mmol/L (ref 134–144)
Total Protein: 7.1 g/dL (ref 6.0–8.5)
eGFR: 108 mL/min/{1.73_m2} (ref >59)

## 2022-06-24 LAB — CBC WITH DIFFERENTIAL/PLATELET
Basophils Absolute: 0.1 10*3/uL (ref 0.0–0.2)
Basos: 1 %
EOS (ABSOLUTE): 0.2 10*3/uL (ref 0.0–0.4)
Eos: 3 %
Hematocrit: 45.1 % (ref 34.0–46.6)
Hemoglobin: 14.7 g/dL (ref 11.1–15.9)
Immature Grans (Abs): 0 10*3/uL (ref 0.0–0.1)
Immature Granulocytes: 0 %
Lymphocytes Absolute: 2.3 10*3/uL (ref 0.7–3.1)
Lymphs: 40 %
MCH: 26.9 pg (ref 26.6–33.0)
MCHC: 32.6 g/dL (ref 31.5–35.7)
MCV: 82 fL (ref 79–97)
Monocytes Absolute: 0.2 10*3/uL (ref 0.1–0.9)
Monocytes: 4 %
Neutrophils Absolute: 3 10*3/uL (ref 1.4–7.0)
Neutrophils: 52 %
Platelets: 186 10*3/uL (ref 150–450)
RBC: 5.47 x10E6/uL — ABNORMAL HIGH (ref 3.77–5.28)
RDW: 14.3 % (ref 11.7–15.4)
WBC: 5.8 10*3/uL (ref 3.4–10.8)

## 2022-06-24 LAB — LIPID PANEL
Chol/HDL Ratio: 3.4 ratio (ref 0.0–4.4)
Cholesterol, Total: 135 mg/dL (ref 100–199)
HDL: 40 mg/dL (ref >39)
LDL Chol Calc (NIH): 65 mg/dL (ref 0–99)
Triglycerides: 177 mg/dL — ABNORMAL HIGH (ref 0–149)
VLDL Cholesterol Cal: 30 mg/dL (ref 5–40)

## 2022-06-24 LAB — TSH: TSH: 1.07 u[IU]/mL (ref 0.450–4.500)

## 2022-06-24 LAB — HEMOGLOBIN A1C
Est. average glucose Bld gHb Est-mCnc: 240 mg/dL
Hgb A1c MFr Bld: 10 % — ABNORMAL HIGH (ref 4.8–5.6)

## 2022-06-24 LAB — T4, FREE: Free T4: 1.16 ng/dL (ref 0.82–1.77)

## 2022-06-29 ENCOUNTER — Ambulatory Visit (HOSPITAL_COMMUNITY): Payer: Federal, State, Local not specified - PPO | Admitting: Physician Assistant

## 2022-07-05 ENCOUNTER — Ambulatory Visit (HOSPITAL_COMMUNITY): Payer: Federal, State, Local not specified - PPO | Admitting: Physician Assistant

## 2022-07-07 ENCOUNTER — Ambulatory Visit: Payer: Federal, State, Local not specified - PPO

## 2022-07-27 ENCOUNTER — Other Ambulatory Visit: Payer: Self-pay

## 2022-07-27 ENCOUNTER — Emergency Department (HOSPITAL_COMMUNITY): Payer: Federal, State, Local not specified - PPO

## 2022-07-27 ENCOUNTER — Emergency Department (HOSPITAL_COMMUNITY)
Admission: EM | Admit: 2022-07-27 | Discharge: 2022-07-28 | Disposition: A | Discharge status: Home / Self Care | Payer: Federal, State, Local not specified - PPO | Attending: Emergency Medicine | Admitting: Emergency Medicine

## 2022-07-27 ENCOUNTER — Encounter (HOSPITAL_COMMUNITY): Payer: Self-pay

## 2022-07-27 DIAGNOSIS — Z794 Long term (current) use of insulin: Secondary | ICD-10-CM | POA: Insufficient documentation

## 2022-07-27 DIAGNOSIS — E876 Hypokalemia: Secondary | ICD-10-CM | POA: Insufficient documentation

## 2022-07-27 DIAGNOSIS — Z79899 Other long term (current) drug therapy: Secondary | ICD-10-CM | POA: Diagnosis not present

## 2022-07-27 DIAGNOSIS — E119 Type 2 diabetes mellitus without complications: Secondary | ICD-10-CM | POA: Diagnosis not present

## 2022-07-27 DIAGNOSIS — I4891 Unspecified atrial fibrillation: Secondary | ICD-10-CM | POA: Insufficient documentation

## 2022-07-27 DIAGNOSIS — Z7901 Long term (current) use of anticoagulants: Secondary | ICD-10-CM | POA: Insufficient documentation

## 2022-07-27 DIAGNOSIS — Z7984 Long term (current) use of oral hypoglycemic drugs: Secondary | ICD-10-CM | POA: Insufficient documentation

## 2022-07-27 DIAGNOSIS — R079 Chest pain, unspecified: Secondary | ICD-10-CM | POA: Insufficient documentation

## 2022-07-27 DIAGNOSIS — Z7982 Long term (current) use of aspirin: Secondary | ICD-10-CM | POA: Diagnosis not present

## 2022-07-27 DIAGNOSIS — I10 Essential (primary) hypertension: Secondary | ICD-10-CM | POA: Insufficient documentation

## 2022-07-27 MED ORDER — ASPIRIN 81 MG PO CHEW
324.0000 mg | CHEWABLE_TABLET | Freq: Once | ORAL | Status: AC
  Administered 2022-07-28: 324 mg via ORAL
  Filled 2022-07-27: qty 4

## 2022-07-27 MED ORDER — FENTANYL CITRATE PF 50 MCG/ML IJ SOSY
50.0000 ug | PREFILLED_SYRINGE | Freq: Once | INTRAMUSCULAR | Status: AC
  Administered 2022-07-28: 50 ug via INTRAVENOUS
  Filled 2022-07-27: qty 1

## 2022-07-27 MED ORDER — NITROGLYCERIN 2 % TD OINT
1.0000 [in_us] | TOPICAL_OINTMENT | Freq: Once | TRANSDERMAL | Status: AC
  Administered 2022-07-28: 1 [in_us] via TOPICAL
  Filled 2022-07-27: qty 1

## 2022-07-27 NOTE — ED Provider Notes (Incomplete)
EMERGENCY DEPARTMENT AT [facility] Provider Note   CSN: [id number] Arrival date & time: 07/27/22  2338     History {Add pertinent medical, surgical, social history, OB history to HPI:1} Chief Complaint  Patient presents with  . Chest Pain    Morgan [name] is a 57 y.o. female.   Chest Pain Patient presents for chest pain.  Medical history includes T2DM, HLD, bipolar disorder, atrial fibrillation, HTN, GERD.  Onset of chest pain was 2 hours prior to arrival.  At the time, she was at rest.  She did have associated feelings of clamminess and lightheadedness.  These have resolved but patient continues to have 8/10 severity pain.  Pain is located in center chest and radiates to right arm.  Patient refused aspirin with EMS.  She did get 1 SL NTG without any improvement of symptoms.     Home Medications Prior to Admission medications   Medication Sig Start Date End Date Taking? Authorizing Provider  acetaminophen (TYLENOL) 500 MG tablet Take 500 mg by mouth every 6 (six) hours as needed for mild pain.    [provider]  apixaban (ELIQUIS) 5 MG TABS tablet Take 1 tablet (5 mg total) by mouth 2 (two) times daily. 03/19/22   Fenton, Clint R, PA  ARIPiprazole (ABILIFY) 15 MG tablet Take 15 mg by mouth at bedtime. 12/21/17   Ellis Savage, NP  Dapagliflozin Pro-metFORMIN ER (XIGDUO XR) 11-998 MG TB24 Take 1 tablet by mouth at bedtime. 06/23/22   [name], PA  DULoxetine (CYMBALTA) 60 MG capsule Take 60 mg by mouth at bedtime. 11/04/16   [provider]  fluticasone (FLONASE) 50 MCG/ACT nasal spray Place 1 spray into both nostrils daily as needed for allergies.    [provider]  Insulin Aspart FlexPen (NOVOLOG) 100 UNIT/ML INJECT 10 UNITS INTO THE SKIN BEFORE BREAKFAST AND LUNCH. INJECT 13 UNITS INTO THE SKIN BEFORE SUPPER. 06/14/22   Saralyn Pilar A, PA  lidocaine (LIDODERM) 5 % Place 1 patch onto the skin daily as needed (pain).     [provider]  metoprolol tartrate (LOPRESSOR) 25 MG tablet Take 0.5 tablets (12.5 mg total) by mouth 2 (two) times daily. 05/20/22   Swinyer, Zachary George, NP  omeprazole (PRILOSEC OTC) 20 MG tablet Take 20 mg by mouth at bedtime.    [provider]  oxyCODONE-acetaminophen (PERCOCET) 5-325 MG tablet Take 1-2 tablets by mouth every 6 (six) hours as needed. 06/01/22   Roxy Horseman, PA-C  rosuvastatin (CRESTOR) 10 MG tablet Take 1 tablet (10 mg total) by mouth daily. 04/08/22   Corky Crafts, MD  tiZANidine (ZANAFLEX) 4 MG tablet Take 4 mg by mouth at bedtime as needed for muscle spasms. 05/14/21   [provider]      Allergies    Clindamycin/lincomycin, Lamictal [lamotrigine], Banana, Lipitor [atorvastatin], and Pineapple    Review of Systems   Review of Systems  Cardiovascular:  Positive for chest pain.  Neurological:  Positive for light-headedness.  All other systems reviewed and are negative.   Physical Exam Updated Vital Signs LMP 05/12/2011  Physical Exam Vitals and nursing note reviewed.  Constitutional:      General: She is not in acute distress.    Appearance: She is well-developed. She is not ill-appearing, toxic-appearing or diaphoretic.  HENT:     Head: Normocephalic and atraumatic.  Eyes:     Extraocular Movements: Extraocular movements intact.     Conjunctiva/sclera: Conjunctivae normal.  Cardiovascular:  Rate and Rhythm: Normal rate and regular rhythm.     Heart sounds: No murmur heard. Pulmonary:     Effort: Pulmonary effort is normal. No respiratory distress.     Breath sounds: Normal breath sounds. No decreased breath sounds, wheezing, rhonchi or rales.  Chest:     Chest wall: No tenderness.  Abdominal:     Palpations: Abdomen is soft.     Tenderness: There is no abdominal tenderness.  Musculoskeletal:        General: No swelling. Normal range of motion.     Cervical back: Normal range of motion and neck supple.      Right lower leg: No edema.     Left lower leg: No edema.  Skin:    General: Skin is warm and dry.     Coloration: Skin is not cyanotic or pale.  Neurological:     General: No focal deficit present.     Mental Status: She is alert and oriented to person, place, and time.  Psychiatric:        Mood and Affect: Mood normal.        Behavior: Behavior normal.     ED Results / Procedures / Treatments   Labs (all labs ordered are listed, but only abnormal results are displayed) Labs Reviewed - No data to display  EKG None  Radiology No results found.  Procedures Procedures  {Document cardiac monitor, telemetry assessment procedure when appropriate:1}  Medications Ordered in ED Medications - No data to display  ED Course/ Medical Decision Making/ A&P   {   Click here for ABCD2, HEART and other calculatorsREFRESH Note before signing :1}                          Medical Decision Making  This patient presents to the ED for concern of ***, this involves an extensive number of treatment options, and is a complaint that carries with it a high risk of complications and morbidity.  The differential diagnosis includes ***   Co morbidities that complicate the patient evaluation  ***   Additional history obtained:  Additional history obtained from *** External records from outside source obtained and reviewed including ***   Lab Tests:  I Ordered, and personally interpreted labs.  The pertinent results include:  ***   Imaging Studies ordered:  I ordered imaging studies including ***  I independently visualized and interpreted imaging which showed *** I agree with the radiologist interpretation   Cardiac Monitoring: / EKG:  The patient was maintained on a cardiac monitor.  I personally viewed and interpreted the cardiac monitored which showed an underlying rhythm of: ***   Consultations Obtained:  I requested consultation with the ***,  and discussed lab and imaging  findings as well as pertinent plan - they recommend: ***   Problem List / ED Course / Critical interventions / Medication management  Patient presents for chest pain.  Onset was 2 hours prior to arrival.  It is located in center of chest and radiates to right arm.  Pain has been persistent with 8/10 severity.  Vital signs on arrival are notable for moderate hypertension.  Patient is well-appearing on exam.  EKG shows normal sinus rhythm without evidence of concerning ST segment changes.  Patient is on Eliquis and getting 1 dose today.  Fentanyl was ordered for analgesia.  Lab work was initiated.***. I ordered medication including ***  for ***  Reevaluation of the patient  after these medicines showed that the patient {resolved/improved/worsened:23923::"improved"} I have reviewed the patients home medicines and have made adjustments as needed   Social Determinants of Health:  ***   Test / Admission - Considered:  ***   {Document critical care time when appropriate:1} {Document review of labs and clinical decision tools ie heart score, Chads2Vasc2 etc:1}  {Document your independent review of radiology images, and any outside records:1} {Document your discussion with family members, caretakers, and with consultants:1} {Document social determinants of health affecting pt's care:1} {Document your decision making why or why not admission, treatments were needed:1} Final Clinical Impression(s) / ED Diagnoses Final diagnoses:  None    Rx / DC Orders ED Discharge Orders     None

## 2022-07-27 NOTE — ED Provider Notes (Signed)
[place] EMERGENCY DEPARTMENT AT [facility] Provider Note   CSN: [id number] Arrival date & time: 07/27/22  2338     History {Add pertinent medical, surgical, social history, OB history to HPI:1} Chief Complaint  Patient presents with   Chest Pain    Morgan [name] is a 57 y.o. female.   Chest Pain Patient presents for chest pain.  Medical history includes T2DM, HLD, bipolar disorder, atrial fibrillation, HTN, GERD.  Onset of chest pain was 2 hours prior to arrival.  Pain is located in center chest and radiates to right arm.  Patient refused aspirin with EMS.  She did get 1 SL NTG.     Home Medications Prior to Admission medications   Medication Sig Start Date End Date Taking? Authorizing Provider  acetaminophen (TYLENOL) 500 MG tablet Take 500 mg by mouth every 6 (six) hours as needed for mild pain.    [provider]  apixaban (ELIQUIS) 5 MG TABS tablet Take 1 tablet (5 mg total) by mouth 2 (two) times daily. 03/19/22   Fenton, Clint R, PA  ARIPiprazole (ABILIFY) 15 MG tablet Take 15 mg by mouth at bedtime. 12/21/17   Ellis Savage, NP  Dapagliflozin Pro-metFORMIN ER (XIGDUO XR) 11-998 MG TB24 Take 1 tablet by mouth at bedtime. 06/23/22   [name], PA  DULoxetine (CYMBALTA) 60 MG capsule Take 60 mg by mouth at bedtime. 11/04/16   [provider]  fluticasone (FLONASE) 50 MCG/ACT nasal spray Place 1 spray into both nostrils daily as needed for allergies.    [provider]  Insulin Aspart FlexPen (NOVOLOG) 100 UNIT/ML INJECT 10 UNITS INTO THE SKIN BEFORE BREAKFAST AND LUNCH. INJECT 13 UNITS INTO THE SKIN BEFORE SUPPER. 06/14/22   Saralyn Pilar A, PA  lidocaine (LIDODERM) 5 % Place 1 patch onto the skin daily as needed (pain).    [provider]  metoprolol tartrate (LOPRESSOR) 25 MG tablet Take 0.5 tablets (12.5 mg total) by mouth 2 (two) times daily. 05/20/22   Swinyer, Zachary George, NP  omeprazole (PRILOSEC OTC) 20 MG tablet Take  20 mg by mouth at bedtime.    [provider]  oxyCODONE-acetaminophen (PERCOCET) 5-325 MG tablet Take 1-2 tablets by mouth every 6 (six) hours as needed. 06/01/22   Roxy Horseman, PA-C  rosuvastatin (CRESTOR) 10 MG tablet Take 1 tablet (10 mg total) by mouth daily. 04/08/22   Corky Crafts, MD  tiZANidine (ZANAFLEX) 4 MG tablet Take 4 mg by mouth at bedtime as needed for muscle spasms. 05/14/21   [provider]      Allergies    Clindamycin/lincomycin, Lamictal [lamotrigine], Banana, Lipitor [atorvastatin], and Pineapple    Review of Systems   Review of Systems  Cardiovascular:  Positive for chest pain.    Physical Exam Updated Vital Signs LMP 05/12/2011  Physical Exam  ED Results / Procedures / Treatments   Labs (all labs ordered are listed, but only abnormal results are displayed) Labs Reviewed - No data to display  EKG None  Radiology No results found.  Procedures Procedures  {Document cardiac monitor, telemetry assessment procedure when appropriate:1}  Medications Ordered in ED Medications - No data to display  ED Course/ Medical Decision Making/ A&P   {   Click here for ABCD2, HEART and other calculatorsREFRESH Note before signing :1}                          Medical Decision Making  ***  {  Document critical care time when appropriate:1} {Document review of labs and clinical decision tools ie heart score, Chads2Vasc2 etc:1}  {Document your independent review of radiology images, and any outside records:1} {Document your discussion with family members, caretakers, and with consultants:1} {Document social determinants of health affecting pt's care:1} {Document your decision making why or why not admission, treatments were needed:1} Final Clinical Impression(s) / ED Diagnoses Final diagnoses:  None    Rx / DC Orders ED Discharge Orders     None

## 2022-07-27 NOTE — ED Triage Notes (Signed)
Patient Morgan [name] by [facility] from home with complaints of chest pain for the past 2 hours. EMS states that it is center chest and radiates down the right arm. [facility] gave 0.4 Nitro and pain remained 8/10. Patient refused Aspirin with EMS because she takes eliquis. EMS states the EKG was unremarkable. PMH: diabetes and A-fib.

## 2022-07-28 ENCOUNTER — Emergency Department (HOSPITAL_COMMUNITY): Payer: Federal, State, Local not specified - PPO

## 2022-07-28 LAB — BASIC METABOLIC PANEL
Anion gap: 8 (ref 5–15)
BUN: 8 mg/dL (ref 6–20)
CO2: 26 mmol/L (ref 22–32)
Calcium: 9.7 mg/dL (ref 8.9–10.3)
Chloride: 104 mmol/L (ref 98–111)
Creatinine, Ser: 0.62 mg/dL (ref 0.44–1.00)
GFR, Estimated: >60 mL/min (ref >60)
Glucose, Bld: 128 mg/dL — ABNORMAL HIGH (ref 70–99)
Potassium: 3.2 mmol/L — ABNORMAL LOW (ref 3.5–5.1)
Sodium: 138 mmol/L (ref 135–145)

## 2022-07-28 LAB — HEPATIC FUNCTION PANEL
ALT: 27 U/L (ref 0–44)
AST: 25 U/L (ref 15–41)
Albumin: 3.4 g/dL — ABNORMAL LOW (ref 3.5–5.0)
Alkaline Phosphatase: 105 U/L (ref 38–126)
Bilirubin, Direct: 0.1 mg/dL (ref 0.0–0.2)
Indirect Bilirubin: 0.6 mg/dL (ref 0.3–0.9)
Total Bilirubin: 0.7 mg/dL (ref 0.3–1.2)
Total Protein: 6.5 g/dL (ref 6.5–8.1)

## 2022-07-28 LAB — CBC WITH DIFFERENTIAL/PLATELET
Abs Immature Granulocytes: 0.04 10*3/uL (ref 0.00–0.07)
Basophils Absolute: 0.1 10*3/uL (ref 0.0–0.1)
Basophils Relative: 1 %
Eosinophils Absolute: 0.4 10*3/uL (ref 0.0–0.5)
Eosinophils Relative: 5 %
HCT: 39.1 % (ref 36.0–46.0)
Hemoglobin: 13 g/dL (ref 12.0–15.0)
Immature Granulocytes: 1 %
Lymphocytes Relative: 33 %
Lymphs Abs: 2.6 10*3/uL (ref 0.7–4.0)
MCH: 27.5 pg (ref 26.0–34.0)
MCHC: 33.2 g/dL (ref 30.0–36.0)
MCV: 82.7 fL (ref 80.0–100.0)
Monocytes Absolute: 0.3 10*3/uL (ref 0.1–1.0)
Monocytes Relative: 4 %
Neutro Abs: 4.3 10*3/uL (ref 1.7–7.7)
Neutrophils Relative %: 56 %
Platelets: 217 10*3/uL (ref 150–400)
RBC: 4.73 MIL/uL (ref 3.87–5.11)
RDW: 13.5 % (ref 11.5–15.5)
WBC: 7.8 10*3/uL (ref 4.0–10.5)
nRBC: 0 % (ref 0.0–0.2)

## 2022-07-28 LAB — URINALYSIS, ROUTINE W REFLEX MICROSCOPIC
Bilirubin Urine: NEGATIVE
Glucose, UA: >=500 mg/dL — AB
Hgb urine dipstick: NEGATIVE
Ketones, ur: NEGATIVE mg/dL
Leukocytes,Ua: NEGATIVE
Nitrite: NEGATIVE
Protein, ur: NEGATIVE mg/dL
Specific Gravity, Urine: 1.029 (ref 1.005–1.030)
pH: 5 (ref 5.0–8.0)

## 2022-07-28 LAB — RAPID URINE DRUG SCREEN, HOSP PERFORMED
Amphetamines: NOT DETECTED
Barbiturates: NOT DETECTED
Benzodiazepines: NOT DETECTED
Cocaine: NOT DETECTED
Opiates: NOT DETECTED
Tetrahydrocannabinol: NOT DETECTED

## 2022-07-28 LAB — LIPASE, BLOOD: Lipase: 28 U/L (ref 11–51)

## 2022-07-28 LAB — CBG MONITORING, ED: Glucose-Capillary: 123 mg/dL — ABNORMAL HIGH (ref 70–99)

## 2022-07-28 LAB — TROPONIN I (HIGH SENSITIVITY)
Troponin I (High Sensitivity): 4 ng/L (ref <18)
Troponin I (High Sensitivity): 4 ng/L (ref <18)

## 2022-07-28 LAB — MAGNESIUM: Magnesium: 1.9 mg/dL (ref 1.7–2.4)

## 2022-07-28 MED ORDER — LIDOCAINE VISCOUS HCL 2 % MT SOLN
15.0000 mL | Freq: Once | OROMUCOSAL | Status: AC
  Administered 2022-07-28: 15 mL via ORAL
  Filled 2022-07-28: qty 15

## 2022-07-28 MED ORDER — IOHEXOL 350 MG/ML SOLN
75.0000 mL | Freq: Once | INTRAVENOUS | Status: AC | PRN
  Administered 2022-07-28: 75 mL via INTRAVENOUS

## 2022-07-28 MED ORDER — FAMOTIDINE IN NACL 20-0.9 MG/50ML-% IV SOLN
20.0000 mg | Freq: Once | INTRAVENOUS | Status: AC
  Administered 2022-07-28: 20 mg via INTRAVENOUS
  Filled 2022-07-28: qty 50

## 2022-07-28 MED ORDER — METOCLOPRAMIDE HCL 5 MG/ML IJ SOLN
10.0000 mg | Freq: Once | INTRAMUSCULAR | Status: AC
  Administered 2022-07-28: 10 mg via INTRAVENOUS
  Filled 2022-07-28: qty 2

## 2022-07-28 MED ORDER — KETOROLAC TROMETHAMINE 15 MG/ML IJ SOLN
15.0000 mg | Freq: Once | INTRAMUSCULAR | Status: AC
  Administered 2022-07-28: 15 mg via INTRAVENOUS
  Filled 2022-07-28: qty 1

## 2022-07-28 MED ORDER — ALUM & MAG HYDROXIDE-SIMETH 200-200-20 MG/5ML PO SUSP
30.0000 mL | Freq: Once | ORAL | Status: AC
  Administered 2022-07-28: 30 mL via ORAL
  Filled 2022-07-28: qty 30

## 2022-07-28 MED ORDER — POTASSIUM CHLORIDE CRYS ER 20 MEQ PO TBCR
40.0000 meq | EXTENDED_RELEASE_TABLET | Freq: Once | ORAL | Status: AC
  Administered 2022-07-28: 40 meq via ORAL
  Filled 2022-07-28: qty 2

## 2022-07-28 NOTE — ED Notes (Signed)
Patient ambulated to the restroom independently  

## 2022-07-28 NOTE — Discharge Instructions (Signed)
Continue taking your omeprazole daily.  You should hear from the cardiology office within the next couple days to set up a follow-up appointment.  If you do not hear from them, call the number below.  Return to emergency department for any new or worsening symptoms of concern.

## 2022-08-09 ENCOUNTER — Other Ambulatory Visit: Payer: Self-pay | Admitting: Family Medicine

## 2022-08-09 DIAGNOSIS — Z794 Long term (current) use of insulin: Secondary | ICD-10-CM

## 2022-08-19 ENCOUNTER — Encounter: Payer: Self-pay | Admitting: *Deleted

## 2022-08-19 ENCOUNTER — Ambulatory Visit
Admission: EM | Admit: 2022-08-19 | Discharge: 2022-08-19 | Disposition: A | Discharge status: Home / Self Care | Payer: Federal, State, Local not specified - PPO | Attending: Internal Medicine | Admitting: Internal Medicine

## 2022-08-19 DIAGNOSIS — B372 Candidiasis of skin and nail: Secondary | ICD-10-CM | POA: Diagnosis not present

## 2022-08-19 MED ORDER — CLOTRIMAZOLE 1 % EX CREA: TOPICAL_CREAM | CUTANEOUS | 0 refills | Status: DC

## 2022-08-19 NOTE — ED Triage Notes (Signed)
C/O red, pruritic, burning rash under fold of lower abd onset yesterday; states it appears to be bleeding some at times. Has tried applying Monistat without relief.

## 2022-08-19 NOTE — ED Provider Notes (Signed)
[facility]    CSN: [id number] Arrival date & time: 08/19/22  1030      History   Chief Complaint Chief Complaint  Patient presents with   Rash    HPI Morgan [name] is a 57 y.o. female.   Patient presents with a rash that is present underneath her abdominal fold that she noticed yesterday.  Reports it is itchy and has a "burning" painful sensation.  She reports that she applied Monistat cream with no improvement.  Denies any changes to the environment that could have caused symptoms.  Denies any fever.   Rash   Past Medical History:  Diagnosis Date   Anxiety    Atrial fibrillation (HCC)    Bipolar disorder (HCC)    Breast mass    right, removed and benign   Cancer (HCC) 09/16/2019   ovarian ca no radiation no chemo   Cervical dysplasia    Diabetes mellitus without complication (HCC)    Endometriosis    GERD (gastroesophageal reflux disease)    Hypercholesteremia    Hypertension    Migraine    Ovarian cancer (HCC)    Pelvic kidney    left   Postpartum depression    hx of    Patient Active Problem List   Diagnosis Date Noted   Pressure injury of skin 04/29/2022   Shock (HCC) 04/29/2022   Anticoagulated 03/02/2022   Ischemic colitis (HCC) 03/02/2022   Paroxysmal atrial fibrillation (HCC) 12/31/2020   Hypercoagulable state due to paroxysmal atrial fibrillation (HCC) 12/31/2020   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   History of endometrial hyperplasia 08/12/2019   Fatty liver 08/12/2019   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   Body mass index (BMI) 33.0-33.9, adult 05/11/2019   Cervical dysplasia 04/18/2019   Other chronic pain 03/16/2019   Lumbago with sciatica, right side 03/16/2019   Low back pain 03/16/2019   Type 2 diabetes mellitus with other specified complication (HCC) 06/14/2017   Hyperlipidemia associated with type 2 diabetes mellitus (HCC) 06/14/2017   Essential hypertension 06/14/2017   Displacement of  lumbar intervertebral disc without myelopathy 09/20/2012   Bipolar disorder (HCC)     Past Surgical History:  Procedure Laterality Date   ABDOMINAL HYSTERECTOMY N/A    [employer] 07/30/2019   BACK SURGERY  02/16/2012   BREAST BIOPSY Right 2016   BREAST BIOPSY Right 2020   BREAST EXCISIONAL BIOPSY Right 2021   BREAST EXCISIONAL BIOPSY Right 2016   benign   BREAST LUMPECTOMY WITH RADIOACTIVE SEED LOCALIZATION Right 10/28/2014   Procedure: RIGHT BREAST LUMPECTOMY WITH RADIOACTIVE SEED LOCALIZATION;  Surgeon: Chevis Pretty III, MD;  Location: [facility];  Service: General;  Laterality: Right;   BREAST LUMPECTOMY WITH RADIOACTIVE SEED LOCALIZATION Right 06/18/2019   Procedure: RIGHT BREAST RADIOACTIVE SEED LOCALIZATION LUMPECTOMY;  Surgeon: Griselda Miner, MD;  Location: [facility];  Service: General;  Laterality: Right;   BREAST SURGERY N/A    [employer] 07/30/2019   CESAREAN SECTION  02/15/2001   COLONOSCOPY WITH PROPOFOL N/A 05/01/2018   Procedure: COLONOSCOPY WITH PROPOFOL;  Surgeon: Wyline Mood, MD;  Location: [facility] ENDOSCOPY;  Service: Gastroenterology;  Laterality: N/A;   COLPOSCOPY     ESOPHAGOGASTRODUODENOSCOPY (EGD) WITH PROPOFOL N/A 05/01/2018   Procedure: ESOPHAGOGASTRODUODENOSCOPY (EGD) WITH PROPOFOL;  Surgeon: Wyline Mood, MD;  Location: [facility] ENDOSCOPY;  Service: Gastroenterology;  Laterality: N/A;   ESOPHAGOGASTRODUODENOSCOPY (EGD) WITH PROPOFOL N/A 11/13/2018   Procedure: ESOPHAGOGASTRODUODENOSCOPY (EGD) WITH PROPOFOL;  Surgeon: Wyline Mood, MD;  Location:  [facility] ENDOSCOPY;  Service: Gastroenterology;  Laterality: N/A;   GYNECOLOGIC CRYOSURGERY     NECK SURGERY  02/15/2010   PELVIC LAPAROSCOPY  02/15/1989    OB History     Gravida  1   Para  1   Term  1   Preterm      AB  0   Living  1      SAB      IAB      Ectopic  0   Multiple      Live Births               Home Medications    Prior to Admission medications    Medication Sig Start Date End Date Taking? Authorizing Provider  apixaban (ELIQUIS) 5 MG TABS tablet Take 1 tablet (5 mg total) by mouth 2 (two) times daily. 03/19/22  Yes Fenton, Clint R, PA  ARIPiprazole (ABILIFY) 15 MG tablet Take 15 mg by mouth at bedtime. 12/21/17  Yes Ellis Savage, NP  clotrimazole (LOTRIMIN) 1 % cream Apply to affected area 2 times daily 08/19/22  Yes , [place], [place]  Dapagliflozin Pro-metFORMIN ER (XIGDUO XR) 11-998 MG TB24 Take 1 tablet by mouth at bedtime. 06/23/22  Yes Saralyn Pilar A, PA  DULoxetine (CYMBALTA) 60 MG capsule Take 60 mg by mouth at bedtime. 11/04/16  Yes [provider]  Insulin Aspart FlexPen (NOVOLOG) 100 UNIT/ML INJECT 10 UNITS INTO THE SKIN BEFORE BREAKFAST AND LUNCH. INJECT 13 UNITS INTO THE SKIN BEFORE SUPPER. 08/09/22  Yes Saralyn Pilar A, PA  metoprolol tartrate (LOPRESSOR) 25 MG tablet Take 0.5 tablets (12.5 mg total) by mouth 2 (two) times daily. 05/20/22  Yes Swinyer, Zachary George, NP  omeprazole (PRILOSEC OTC) 20 MG tablet Take 20 mg by mouth at bedtime.   Yes [provider]  rosuvastatin (CRESTOR) 10 MG tablet Take 1 tablet (10 mg total) by mouth daily. 04/08/22  Yes Corky Crafts, MD  tiZANidine (ZANAFLEX) 4 MG tablet Take 4 mg by mouth at bedtime as needed for muscle spasms. 05/14/21  Yes [provider]  acetaminophen (TYLENOL) 500 MG tablet Take 500 mg by mouth every 6 (six) hours as needed for mild pain.    [provider]  fluticasone (FLONASE) 50 MCG/ACT nasal spray Place 1 spray into both nostrils daily as needed for allergies.    [provider]  lidocaine (LIDODERM) 5 % Place 1 patch onto the skin daily as needed (pain).    [provider]  oxyCODONE-acetaminophen (PERCOCET) 5-325 MG tablet Take 1-2 tablets by mouth every 6 (six) hours as needed. 06/01/22   Roxy Horseman, PA-C    Family History Family History  Problem Relation Age of Onset   Diabetes Mother    Breast  cancer Mother 1   Thyroid disease Mother    Depression Mother    Hypertension Father    Heart disease Father    Heart attack Father 64       Two instances, first at age 6   Stroke Father    Alcohol abuse Father    Throat cancer Father    Diabetes Maternal Grandmother    Depression Maternal Grandmother    Uterine cancer Maternal Grandmother     Social History Social History   Tobacco Use   Smoking status: Never    Passive exposure: Never   Smokeless tobacco: Never   Tobacco comments:    Never smoke 07/01/21  Vaping Use   Vaping Use: Never  used  Substance Use Topics   Alcohol use: No   Drug use: No     Allergies   Clindamycin/lincomycin, Lamictal [lamotrigine], Banana, Lipitor [atorvastatin], and Pineapple   Review of Systems Review of Systems Per HPI  Physical Exam Triage Vital Signs ED Triage Vitals  Enc Vitals Group     BP 08/19/22 1111 (!) 146/87     Pulse Rate 08/19/22 1111 93     Resp 08/19/22 1111 18     Temp 08/19/22 1111 98.6 F (37 C)     Temp Source 08/19/22 1111 Oral     SpO2 08/19/22 1111 93 %     Weight --      Height --      Head Circumference --      Peak Flow --      Pain Score 08/19/22 1112 8     Pain Loc --      Pain Edu? --      Excl. in GC? --    No data found.  Updated Vital Signs BP (!) 146/87   Pulse 93   Temp 98.6 F (37 C) (Oral)   Resp 18   LMP 05/12/2011   SpO2 94%   Visual Acuity Right Eye Distance:   Left Eye Distance:   Bilateral Distance:    Right Eye Near:   Left Eye Near:    Bilateral Near:     Physical Exam Constitutional:      General: She is not in acute distress.    Appearance: Normal appearance. She is not toxic-appearing or diaphoretic.  HENT:     Head: Normocephalic and atraumatic.  Eyes:     Extraocular Movements: Extraocular movements intact.     Conjunctiva/sclera: Conjunctivae normal.  Cardiovascular:     Rate and Rhythm: Normal rate and regular rhythm.     Pulses: Normal pulses.      Heart sounds: Normal heart sounds.  Pulmonary:     Effort: Pulmonary effort is normal. No respiratory distress.     Breath sounds: Normal breath sounds. No stridor. No wheezing, rhonchi or rales.  Skin:    Comments: Patient has an area of erythema directly underneath lower abdominal fold to the center.  Satellite like lesions present.  No abscesses noted.  Neurological:     General: No focal deficit present.     Mental Status: She is alert and oriented to person, place, and time. Mental status is at baseline.  Psychiatric:        Mood and Affect: Mood normal.        Behavior: Behavior normal.        Thought Content: Thought content normal.        Judgment: Judgment normal.      UC Treatments / Results  Labs (all labs ordered are listed, but only abnormal results are displayed) Labs Reviewed - No data to display  EKG   Radiology No results found.  Procedures Procedures (including critical care time)  Medications Ordered in UC Medications - No data to display  Initial Impression / Assessment and Plan / UC Course  I have reviewed the triage vital signs and the nursing notes.  Pertinent labs & imaging results that were available during my care of the patient were reviewed by me and considered in my medical decision making (see chart for details).     Physical exam is consistent with candidiasis of the skin.  Will treat with clotrimazole topically.  Will defer Diflucan given it can  interact with patient's daily medications.  Advised patient to follow-up in the next few weeks if symptoms persist or worsen.  Patient verbalized understanding and was agreeable with plan. Final Clinical Impressions(s) / UC Diagnoses   Final diagnoses:  Candidal skin infection     Discharge Instructions      You have a yeast infection of your skin.  I have prescribed a topical cream to apply to area.  Follow-up if any symptoms persist or worsen.  Will defer pill for now given it could  interact with your daily medications.    ED Prescriptions     Medication Sig Dispense Auth. Provider   clotrimazole (LOTRIMIN) 1 % cream Apply to affected area 2 times daily 60 g Gustavus Bryant, [place]      PDMP not reviewed this encounter.   Gustavus Bryant, [place] 08/19/22 [phone]

## 2022-08-19 NOTE — Discharge Instructions (Addendum)
You have a yeast infection of your skin.  I have prescribed a topical cream to apply to area.  Follow-up if any symptoms persist or worsen.  Will defer pill for now given it could interact with your daily medications.

## 2022-09-07 ENCOUNTER — Emergency Department (HOSPITAL_BASED_OUTPATIENT_CLINIC_OR_DEPARTMENT_OTHER): Payer: Federal, State, Local not specified - PPO | Admitting: Radiology

## 2022-09-07 ENCOUNTER — Emergency Department (HOSPITAL_BASED_OUTPATIENT_CLINIC_OR_DEPARTMENT_OTHER)
Admission: EM | Admit: 2022-09-07 | Discharge: 2022-09-07 | Disposition: A | Discharge status: Home / Self Care | Payer: Federal, State, Local not specified - PPO | Attending: Emergency Medicine | Admitting: Emergency Medicine

## 2022-09-07 ENCOUNTER — Other Ambulatory Visit: Payer: Self-pay

## 2022-09-07 ENCOUNTER — Emergency Department (HOSPITAL_BASED_OUTPATIENT_CLINIC_OR_DEPARTMENT_OTHER): Payer: Federal, State, Local not specified - PPO

## 2022-09-07 ENCOUNTER — Encounter (HOSPITAL_BASED_OUTPATIENT_CLINIC_OR_DEPARTMENT_OTHER): Payer: Self-pay | Admitting: Emergency Medicine

## 2022-09-07 DIAGNOSIS — R197 Diarrhea, unspecified: Secondary | ICD-10-CM | POA: Diagnosis not present

## 2022-09-07 DIAGNOSIS — Z7984 Long term (current) use of oral hypoglycemic drugs: Secondary | ICD-10-CM | POA: Diagnosis not present

## 2022-09-07 DIAGNOSIS — E119 Type 2 diabetes mellitus without complications: Secondary | ICD-10-CM | POA: Insufficient documentation

## 2022-09-07 DIAGNOSIS — M25512 Pain in left shoulder: Secondary | ICD-10-CM | POA: Insufficient documentation

## 2022-09-07 DIAGNOSIS — X58XXXA Exposure to other specified factors, initial encounter: Secondary | ICD-10-CM | POA: Insufficient documentation

## 2022-09-07 DIAGNOSIS — R1084 Generalized abdominal pain: Secondary | ICD-10-CM | POA: Insufficient documentation

## 2022-09-07 DIAGNOSIS — Z20822 Contact with and (suspected) exposure to covid-19: Secondary | ICD-10-CM | POA: Insufficient documentation

## 2022-09-07 DIAGNOSIS — Z794 Long term (current) use of insulin: Secondary | ICD-10-CM | POA: Insufficient documentation

## 2022-09-07 DIAGNOSIS — R079 Chest pain, unspecified: Secondary | ICD-10-CM | POA: Insufficient documentation

## 2022-09-07 DIAGNOSIS — T184XXA Foreign body in colon, initial encounter: Secondary | ICD-10-CM | POA: Insufficient documentation

## 2022-09-07 DIAGNOSIS — Z7901 Long term (current) use of anticoagulants: Secondary | ICD-10-CM | POA: Diagnosis not present

## 2022-09-07 LAB — CBC WITH DIFFERENTIAL/PLATELET
Abs Immature Granulocytes: 0.04 10*3/uL (ref 0.00–0.07)
Basophils Absolute: 0.1 10*3/uL (ref 0.0–0.1)
Basophils Relative: 1 %
Eosinophils Absolute: 0.3 10*3/uL (ref 0.0–0.5)
Eosinophils Relative: 4 %
HCT: 46.8 % — ABNORMAL HIGH (ref 36.0–46.0)
Hemoglobin: 15.1 g/dL — ABNORMAL HIGH (ref 12.0–15.0)
Immature Granulocytes: 0 %
Lymphocytes Relative: 23 %
Lymphs Abs: 2.1 10*3/uL (ref 0.7–4.0)
MCH: 26.8 pg (ref 26.0–34.0)
MCHC: 32.3 g/dL (ref 30.0–36.0)
MCV: 83 fL (ref 80.0–100.0)
Monocytes Absolute: 0.3 10*3/uL (ref 0.1–1.0)
Monocytes Relative: 3 %
Neutro Abs: 6.5 10*3/uL (ref 1.7–7.7)
Neutrophils Relative %: 69 %
Platelets: 241 10*3/uL (ref 150–400)
RBC: 5.64 MIL/uL — ABNORMAL HIGH (ref 3.87–5.11)
RDW: 14.1 % (ref 11.5–15.5)
WBC: 9.4 10*3/uL (ref 4.0–10.5)
nRBC: 0 % (ref 0.0–0.2)

## 2022-09-07 LAB — LIPASE, BLOOD: Lipase: 13 U/L (ref 11–51)

## 2022-09-07 LAB — URINALYSIS, ROUTINE W REFLEX MICROSCOPIC
Bilirubin Urine: NEGATIVE
Glucose, UA: >1000 mg/dL — AB
Hgb urine dipstick: NEGATIVE
Ketones, ur: NEGATIVE mg/dL
Leukocytes,Ua: NEGATIVE
Nitrite: NEGATIVE
Specific Gravity, Urine: 1.035 — ABNORMAL HIGH (ref 1.005–1.030)
pH: 5.5 (ref 5.0–8.0)

## 2022-09-07 LAB — COMPREHENSIVE METABOLIC PANEL
ALT: 30 U/L (ref 0–44)
AST: 35 U/L (ref 15–41)
Albumin: 4.4 g/dL (ref 3.5–5.0)
Alkaline Phosphatase: 109 U/L (ref 38–126)
Anion gap: 17 — ABNORMAL HIGH (ref 5–15)
BUN: 9 mg/dL (ref 6–20)
CO2: 24 mmol/L (ref 22–32)
Calcium: 9.9 mg/dL (ref 8.9–10.3)
Chloride: 100 mmol/L (ref 98–111)
Creatinine, Ser: 0.67 mg/dL (ref 0.44–1.00)
GFR, Estimated: >60 mL/min (ref >60)
Glucose, Bld: 220 mg/dL — ABNORMAL HIGH (ref 70–99)
Potassium: 3.9 mmol/L (ref 3.5–5.1)
Sodium: 141 mmol/L (ref 135–145)
Total Bilirubin: 0.6 mg/dL (ref 0.3–1.2)
Total Protein: 7.8 g/dL (ref 6.5–8.1)

## 2022-09-07 LAB — SARS CORONAVIRUS 2 BY RT PCR: SARS Coronavirus 2 by RT PCR: NEGATIVE

## 2022-09-07 LAB — TROPONIN I (HIGH SENSITIVITY): Troponin I (High Sensitivity): <2 ng/L (ref <18)

## 2022-09-07 MED ORDER — MORPHINE SULFATE (PF) 4 MG/ML IV SOLN
4.0000 mg | Freq: Once | INTRAVENOUS | Status: AC
  Administered 2022-09-07: 4 mg via INTRAVENOUS
  Filled 2022-09-07: qty 1

## 2022-09-07 MED ORDER — ONDANSETRON HCL 4 MG/2ML IJ SOLN
4.0000 mg | Freq: Once | INTRAMUSCULAR | Status: AC
  Administered 2022-09-07: 4 mg via INTRAVENOUS
  Filled 2022-09-07: qty 2

## 2022-09-07 MED ORDER — ONDANSETRON 4 MG PO TBDP: 4.0000 mg | ORAL_TABLET | Freq: Three times a day (TID) | ORAL | 0 refills | Status: DC | PRN

## 2022-09-07 MED ORDER — IOHEXOL 300 MG/ML  SOLN
100.0000 mL | Freq: Once | INTRAMUSCULAR | Status: AC | PRN
  Administered 2022-09-07: 85 mL via INTRAVENOUS

## 2022-09-07 MED ORDER — CYCLOBENZAPRINE HCL 5 MG PO TABS: 5.0000 mg | ORAL_TABLET | Freq: Two times a day (BID) | ORAL | 0 refills | Status: DC | PRN

## 2022-09-07 MED ORDER — SODIUM CHLORIDE 0.9 % IV BOLUS
1000.0000 mL | Freq: Once | INTRAVENOUS | Status: AC
  Administered 2022-09-07: 1000 mL via INTRAVENOUS

## 2022-09-07 NOTE — Discharge Instructions (Signed)
Please follow-up with your primary care doctor, there was found to have a possible metallic foreign body in your right lower quadrant, however there is no evidence of perforation, and it does not appear that your pain is on the spine, please follow-up with your primary care doctor for further evaluation.  Additionally I think that your pain is largely musculoskeletal, on your upper chest/shoulder, please take the Flexeril and Tylenol for pain control.  Do not take Flexeril if you are still taking your tizanidine.  Additionally your abdominal pain may be secondary to a GI bug, please drink lots of fluids, and rest.  Return to the ER if you feel like your symptoms are worsening.

## 2022-09-07 NOTE — ED Triage Notes (Signed)
Patient c/o LUQ abd pain radiating into back with n/v/d since Saturday.

## 2022-09-07 NOTE — ED Notes (Signed)
Patient verbalizes understanding of discharge instructions. Opportunity for questioning and answers were provided. Patient discharged from ED.  °

## 2022-09-07 NOTE — ED Provider Notes (Signed)
EMERGENCY DEPARTMENT AT [facility] Provider Note   CSN: [id number] Arrival date & time: 09/07/22  1237     History  Chief Complaint  Patient presents with   Abdominal Pain    Morgan [name] is a 57 y.o. female, history of diabetes, A-fib, who presents to the ED secondary to left breast/upper abdominal pain, that is been going on for the last 4 days, with associated left shoulder pain, that is sharp and stabbing, and worse with movement.  She states that she does not have any shortness of breath, and the pain comes and goes.  It is only alleviated when she lays on her other side.  Denies any rash.  No recent illnesses, but has felt like she has had chills.  Has not missed any doses of her blood thinner.  Morgan [name] states that she developed some nausea, and diarrhea the past 36 hours.  States that for the past 36 hours she has had 10 episodes of diarrhea, that is loose and watery with associated nausea.  Denies any vomiting.  No fevers.    Home Medications Prior to Admission medications   Medication Sig Start Date End Date Taking? Authorizing Provider  cyclobenzaprine (FLEXERIL) 5 MG tablet Take 1 tablet (5 mg total) by mouth 2 (two) times daily as needed for muscle spasms. 09/07/22  Yes ,  L, PA  ondansetron (ZOFRAN-ODT) 4 MG disintegrating tablet Take 1 tablet (4 mg total) by mouth every 8 (eight) hours as needed. 09/07/22  Yes ,  L, PA  acetaminophen (TYLENOL) 500 MG tablet Take 500 mg by mouth every 6 (six) hours as needed for mild pain.    [provider]  apixaban (ELIQUIS) 5 MG TABS tablet Take 1 tablet (5 mg total) by mouth 2 (two) times daily. 03/19/22   Fenton, Clint R, PA  ARIPiprazole (ABILIFY) 15 MG tablet Take 15 mg by mouth at bedtime. 12/21/17   Ellis Savage, NP  clotrimazole (LOTRIMIN) 1 % cream Apply to affected area 2 times daily 08/19/22   Gustavus Bryant, [place]  Dapagliflozin Pro-metFORMIN ER (XIGDUO XR) 11-998 MG TB24 Take 1  tablet by mouth at bedtime. 06/23/22   [name], PA  DULoxetine (CYMBALTA) 60 MG capsule Take 60 mg by mouth at bedtime. 11/04/16   [provider]  fluticasone (FLONASE) 50 MCG/ACT nasal spray Place 1 spray into both nostrils daily as needed for allergies.    [provider]  Insulin Aspart FlexPen (NOVOLOG) 100 UNIT/ML INJECT 10 UNITS INTO THE SKIN BEFORE BREAKFAST AND LUNCH. INJECT 13 UNITS INTO THE SKIN BEFORE SUPPER. 08/09/22   Saralyn Pilar A, PA  lidocaine (LIDODERM) 5 % Place 1 patch onto the skin daily as needed (pain).    [provider]  metoprolol tartrate (LOPRESSOR) 25 MG tablet Take 0.5 tablets (12.5 mg total) by mouth 2 (two) times daily. 05/20/22   Swinyer, Zachary George, NP  omeprazole (PRILOSEC OTC) 20 MG tablet Take 20 mg by mouth at bedtime.    [provider]  oxyCODONE-acetaminophen (PERCOCET) 5-325 MG tablet Take 1-2 tablets by mouth every 6 (six) hours as needed. 06/01/22   Roxy Horseman, PA-C  rosuvastatin (CRESTOR) 10 MG tablet Take 1 tablet (10 mg total) by mouth daily. 04/08/22   Corky Crafts, MD  tiZANidine (ZANAFLEX) 4 MG tablet Take 4 mg by mouth at bedtime as needed for muscle spasms. 05/14/21   [provider]      Allergies    Clindamycin/lincomycin,  Lamictal [lamotrigine], Banana, Lipitor [atorvastatin], and Pineapple    Review of Systems   Review of Systems  Gastrointestinal:  Positive for abdominal pain.    Physical Exam Updated Vital Signs BP (!) 151/62   Pulse (!) 55   Temp 98.2 F (36.8 C) (Oral)   Resp 19   Ht 5\' 1"  (1.549 m)   Wt 83.9 kg   LMP 05/12/2011   SpO2 93%   BMI 34.96 kg/m  Physical Exam Vitals and nursing note reviewed.  Constitutional:      General: She is not in acute distress.    Appearance: She is well-developed.  HENT:     Head: Normocephalic and atraumatic.  Eyes:     Conjunctiva/sclera: Conjunctivae normal.  Cardiovascular:     Rate and Rhythm: Normal rate and  regular rhythm.     Heart sounds: No murmur heard. Pulmonary:     Effort: Pulmonary effort is normal. No respiratory distress.     Breath sounds: Normal breath sounds.  Abdominal:     Palpations: Abdomen is soft.     Tenderness: There is generalized abdominal tenderness. There is no guarding or rebound.  Musculoskeletal:        General: No swelling.     Cervical back: Neck supple.     Comments: TTP of L scapula w/ROM intact. No evidence of rash or ecchymoses. TTP under L breast and LUQ abd  Skin:    General: Skin is warm and dry.     Capillary Refill: Capillary refill takes less than 2 seconds.  Neurological:     Mental Status: She is alert.  Psychiatric:        Mood and Affect: Mood normal.     ED Results / Procedures / Treatments   Labs (all labs ordered are listed, but only abnormal results are displayed) Labs Reviewed  CBC WITH DIFFERENTIAL/PLATELET - Abnormal; Notable for the following components:      Result Value   RBC 5.64 (*)    Hemoglobin 15.1 (*)    HCT 46.8 (*)    All other components within normal limits  COMPREHENSIVE METABOLIC PANEL - Abnormal; Notable for the following components:   Glucose, Bld 220 (*)    Anion gap 17 (*)    All other components within normal limits  URINALYSIS, ROUTINE W REFLEX MICROSCOPIC - Abnormal; Notable for the following components:   Specific Gravity, Urine 1.035 (*)    Glucose, UA >1,000 (*)    Protein, ur TRACE (*)    Bacteria, UA RARE (*)    All other components within normal limits  SARS CORONAVIRUS 2 BY RT PCR  LIPASE, BLOOD  TROPONIN I (HIGH SENSITIVITY)  TROPONIN I (HIGH SENSITIVITY)    EKG None  Radiology DG Chest 2 View  Result Date: 09/07/2022 CLINICAL DATA:  Left-sided chest pain radiating to back EXAM: CHEST - 2 VIEW COMPARISON:  X-ray 07/27/2022. CT angiogram 07/28/2022. Older exams as well FINDINGS: No consolidation, pneumothorax or effusion. No edema. Normal cardiopericardial silhouette. Fixation hardware  along the lower cervical spine. Degenerative changes along the thoracic spine IMPRESSION: No acute cardiopulmonary disease Electronically Signed   By: Karen Kays M.D.   On: 09/07/2022 15:21   CT ABDOMEN PELVIS W CONTRAST  Result Date: 09/07/2022 CLINICAL DATA:  Abdominal pain EXAM: CT ABDOMEN AND PELVIS WITH CONTRAST TECHNIQUE: Multidetector CT imaging of the abdomen and pelvis was performed using the standard protocol following bolus administration of intravenous contrast. RADIATION DOSE REDUCTION: This exam was performed according  to the departmental dose-optimization program which includes automated exposure control, adjustment of the mA and/or kV according to patient size and/or use of iterative reconstruction technique. CONTRAST:  85mL OMNIPAQUE IOHEXOL 300 MG/ML  SOLN COMPARISON:  CT 05/31/2022 FINDINGS: Lower chest: No pleural effusion. There is minimal ground-glass in the right lung base. Hepatobiliary: Diffuse fatty liver infiltration. Slightly nodular contours of the liver as well. Homogeneous enhancing lesion seen peripherally in the right hepatic lobe on series 2, image 24 has persistent enhancement on delayed and consistent with a  hemangioma measuring 17 mm and is unchanged from prior. Separate low-attenuation lesion elsewhere in segment 6 on series 2, image 30. Benign cystic lesion. No specific imaging follow-up. Gallbladder is nondilated. Patent portal vein. Pancreas: Mild fatty atrophy along the pancreas.  No obvious mass. Spleen: Normal in size without focal abnormality. Adrenals/Urinary Tract: Adrenal glands are preserved. No right-sided renal mass or collecting system dilatation. Left kidney is in the pelvis, congenital displacement with malrotation. No left-sided renal mass. There is punctate nonobstructing mid and lower pole renal stones. No ureteral stones. Preserved contours of the urinary bladder. Stomach/Bowel: No oral contrast. Stomach has a normal course and caliber.   bowel is nondilated. Large bowel is of normal course and caliber. Mild stool. Normal retrocecal appendix.  metallic focus with streak artifact seen in the right lower quadrant may be along the course of the colon. Vascular/Lymphatic: No significant vascular findings are present. No enlarged abdominal or pelvic lymph nodes. Reproductive: Status post hysterectomy. No adnexal masses. Other: No free air or free fluid. Musculoskeletal: Degenerative changes seen of the spine and pelvis. Sclerotic bone lesion identified at L2 consistent with possible bone island, unchanged from previous. Fixation hardware along the lower lumbar spine at L4-5. Streak artifact. IMPRESSION: Fatty liver infiltration with a hepatic hemangioma. 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 No bowel obstruction, free air or free fluid.  metallic focus in the right lower quadrant appears to be along the course of the right side of the colon and was not seen on the study of April 2024. Please correlate with any known history or radiopaque foreign body Electronically Signed   By: Karen Kays M.D.   On: 09/07/2022 15:20    Procedures Procedures    Medications Ordered in ED Medications  sodium chloride 0.9 % bolus 1,000 mL (0 mLs Intravenous Stopped 09/07/22 1618)  morphine (PF) 4 MG/ML injection 4 mg (4 mg Intravenous Given 09/07/22 1450)  ondansetron (ZOFRAN) injection 4 mg (4 mg Intravenous Given 09/07/22 1450)  iohexol (OMNIPAQUE) 300 MG/ML solution 100 mL (85 mLs Intravenous Contrast Given 09/07/22 1435)    ED Course/ Medical Decision Making/ A&P                             Medical Decision Making Patient is a 57 year old female, history of A-fib, here for chest pain, radiating to shoulder, has been going on for the last 4 days.  Worse when moving, and tender on exam.  Believe this is likely musculoskeletal, however given her risk factors we will obtain x-ray, troponins for further evaluation.   Additionally complaining of some left-sided abdominal pain, with some diarrhea and nausea.  Will obtain CT abdomen pelvis given tenderness to palpation.  Amount and/or Complexity of Data Reviewed Labs: ordered.    Details: Labs are fairly unremarkable, except for mildly elevated anion gap, glucose of 220 Radiology: ordered.    Details: CT abdomen pelvis, unremarkable  except for possible foreign body in the right lower quadrant ECG/medicine tests:  Decision-making details documented in ED Course. Discussion of management or test interpretation with external provider(s): Discussed with patient, pain is better after fluids, morphine, and nausea medicine.  Believe that her pain is likely secondary to musculoskeletal, on her upper extremities, as she is tender, and is worse with rotation/movement.  Additionally she does have a known foreign body, is metallic in the right lower quadrant, but she does not really have as much pain on the right lower quadrant it is mostly on the left upper quadrant, and she denies ingesting any foreign bodies, or iron pills.  This may be incidental or artifact, I encouraged her to follow-up with her primary regarding this.  I think she may have a GI illness,/viral diarrheal illness, and I advised her on return precautions.  She does not have any red flags, to necessitate antibiotic use for her diarrhea.  Encouraged her to follow-up with the primary care doctor and she voiced understanding.  She is well-appearing on exam, able to tolerate p.o. intake.  Risk Prescription drug management.   Final Clinical Impression(s) / ED Diagnoses Final diagnoses:  Generalized abdominal pain  Foreign body in colon, initial encounter  Acute pain of left shoulder  Chest pain, unspecified type  Diarrhea, unspecified type    Rx / DC Orders ED Discharge Orders          Ordered    cyclobenzaprine (FLEXERIL) 5 MG tablet  2 times daily PRN        09/07/22 1611    ondansetron (ZOFRAN-ODT)  4 MG disintegrating tablet  Every 8 hours PRN        09/07/22 1611              ,  L, PA 09/07/22 1642    Franne Forts, DO 09/17/22 1334

## 2022-09-09 ENCOUNTER — Encounter: Payer: Self-pay | Admitting: Family Medicine

## 2022-09-09 ENCOUNTER — Ambulatory Visit: Payer: Federal, State, Local not specified - PPO | Admitting: Family Medicine

## 2022-09-09 VITALS — BP 131/82 | HR 90 | Ht 61.0 in | Wt 186.8 lb

## 2022-09-09 DIAGNOSIS — R1012 Left upper quadrant pain: Secondary | ICD-10-CM

## 2022-09-09 DIAGNOSIS — M549 Dorsalgia, unspecified: Secondary | ICD-10-CM

## 2022-09-09 DIAGNOSIS — Q632 Ectopic kidney: Secondary | ICD-10-CM | POA: Insufficient documentation

## 2022-09-09 DIAGNOSIS — R1032 Left lower quadrant pain: Secondary | ICD-10-CM

## 2022-09-09 DIAGNOSIS — R053 Chronic cough: Secondary | ICD-10-CM

## 2022-09-09 DIAGNOSIS — Z09 Encounter for follow-up examination after completed treatment for conditions other than malignant neoplasm: Secondary | ICD-10-CM | POA: Diagnosis not present

## 2022-09-09 LAB — POCT URINALYSIS DIP (MANUAL ENTRY)
Bilirubin, UA: NEGATIVE
Blood, UA: NEGATIVE
Glucose, UA: >=1000 mg/dL — AB
Ketones, POC UA: NEGATIVE mg/dL
Leukocytes, UA: NEGATIVE
Nitrite, UA: NEGATIVE
Protein Ur, POC: 100 mg/dL — AB
Spec Grav, UA: 1.025 (ref 1.010–1.025)
Urobilinogen, UA: 0.2 E.U./dL
pH, UA: 5.5 (ref 5.0–8.0)

## 2022-09-09 NOTE — Progress Notes (Signed)
Established Patient Office Visit  Subjective   Patient ID: Morgan [name], female    DOB: 07/25/1965  Age: 57 y.o. MRN: [id number]  Chief Complaint  Patient presents with   Follow-up    ER    HPI Morgan [name] is a 57 y.o. female presenting today for follow up of emergency room visit.  At that time, she presented with left breast/upper abdominal pain that had been occurring for 4 days along with associated left shoulder pain.  About a day after the pain started, she also developed nausea and diarrhea without vomiting or fevers.  In the emergency room, workup included CBC, CMP, urinalysis, COVID test, lipase, troponin.  Lab results within normal limits with the exception of elevated glucose, hemoglobin/hematocrit, trace bacteria on UA.  CT abdomen pelvis was unremarkable with the exception of a possible foreign body in the right lower quadrant as an incidental finding.  She was tolerating p.o. intake by the time of discharge and prescribed Zofran and cyclobenzaprine to take home.  Since that time, she feels slightly better but continues to have left-sided abdominal pain and diarrhea.  She also endorses some left back pain.  ROS Negative unless otherwise noted in HPI   Objective:     BP 131/82   Pulse 90   Ht 5\' 1"  (1.549 m)   Wt 186 lb 12 oz (84.7 kg)   LMP 05/12/2011   SpO2 93%   BMI 35.29 kg/m   Physical Exam Constitutional:      General: She is not in acute distress.    Appearance: Normal appearance.  HENT:     Head: Normocephalic and atraumatic.  Cardiovascular:     Rate and Rhythm: Normal rate and regular rhythm.     Heart sounds: No murmur heard.    No friction rub. No gallop.  Pulmonary:     Effort: Pulmonary effort is normal. No respiratory distress.     Breath sounds: No wheezing, rhonchi or rales.  Abdominal:     General: There is no distension.     Palpations: There is no mass.     Tenderness: There is abdominal tenderness (LUQ, LLQ, suprapubic). There is  left CVA tenderness. There is no right CVA tenderness.  Skin:    General: Skin is warm and dry.  Neurological:     Mental Status: She is alert and oriented to person, place, and time.    Results for orders placed or performed in visit on 09/09/22  POCT urinalysis dipstick  Result Value Ref Range   Color, UA yellow yellow   Clarity, UA clear clear   Glucose, UA >=1,000 (A) negative mg/dL   Bilirubin, UA negative negative   Ketones, POC UA negative negative mg/dL   Spec Grav, UA 4.403 4.742 - 1.025   Blood, UA negative negative   pH, UA 5.5 5.0 - 8.0   Protein Ur, POC =100 (A) negative mg/dL   Urobilinogen, UA 0.2 0.2 or 1.0 E.U./dL   Nitrite, UA Negative Negative   Leukocytes, UA Negative Negative     Assessment & Plan:  Hospital discharge follow-up  LLQ pain -     POCT urinalysis dipstick -     Ambulatory referral to Gastroenterology  LUQ pain -     POCT urinalysis dipstick  Costovertebral angle tenderness -     POCT urinalysis dipstick  Single pelvic kidney (left)  Chronic cough -     Ambulatory referral to Pulmonology  Urinalysis was negative when repeated in  the office today.  Patient does have a history of laparoscopic hysterectomy in 2021 and back surgery in 2014, it is possible that the foreign body is from one of those procedures.  She was also concerned about the groundglass opacity seen in her lung on CT in conjunction with her ongoing chronic cough.  She does not have a history of smoking or any known exposure to causative agents.  She would like to pursue further workup with pulmonology in addition to referral to gastroenterology to continue evaluation of abdominal pain and diarrhea.  In the future, we may discuss her congenital left pelvic kidney and what the most appropriate follow-up for that would be and she needs to see nephrology.  Patient verbalized understanding and is agreeable to this plan.  Return if symptoms worsen or fail to improve.  Return for  previously scheduled follow-up appointment in September.   Morgan Quitter, PA

## 2022-09-14 ENCOUNTER — Encounter (HOSPITAL_BASED_OUTPATIENT_CLINIC_OR_DEPARTMENT_OTHER): Payer: Self-pay

## 2022-09-20 ENCOUNTER — Telehealth: Payer: Self-pay | Admitting: Gastroenterology

## 2022-09-20 NOTE — Telephone Encounter (Signed)
Patient called in to check to see if there were any early appointment available. Informed her that it was not one available, but I did advise her that she can check her Mychart and we will also give her a call. I added the patient to the waiting list.

## 2022-09-21 ENCOUNTER — Encounter: Payer: Self-pay | Admitting: Family Medicine

## 2022-09-21 ENCOUNTER — Other Ambulatory Visit: Payer: Self-pay | Admitting: Family Medicine

## 2022-09-21 MED ORDER — OXYCODONE HCL 5 MG PO TABS: 5.0000 mg | ORAL_TABLET | Freq: Four times a day (QID) | ORAL | 0 refills | Status: AC | PRN

## 2022-09-21 MED ORDER — ONDANSETRON 4 MG PO TBDP: 4.0000 mg | ORAL_TABLET | Freq: Three times a day (TID) | ORAL | 0 refills | Status: DC | PRN

## 2022-09-21 NOTE — Progress Notes (Signed)
Spoke with the patient on the phone regarding her abdominal pain.  She was asking for nausea medicine and pain medication.  Patient has been having continued nausea since her ED visit on 23 July.  Also with continued left upper quadrant abdominal pain.  Take Tylenol.  Cannot take NSAIDs because of her Eliquis.  Diarrhea has resolved.  Last bowel movement was yesterday.  Will prescribe Zofran for nausea Will prescribe few days of oxycodone as needed. Advised patient to use topical treatments such as a heating pad placed on her abdomen while in the supine position and to use the oxycodone only as needed for breakthrough severe pain.

## 2022-09-24 ENCOUNTER — Ambulatory Visit: Payer: Federal, State, Local not specified - PPO | Admitting: Family Medicine

## 2022-09-24 ENCOUNTER — Institutional Professional Consult (permissible substitution): Payer: Federal, State, Local not specified - PPO | Admitting: Pulmonary Disease

## 2022-09-27 ENCOUNTER — Encounter: Payer: Self-pay | Admitting: Family Medicine

## 2022-09-29 ENCOUNTER — Encounter: Payer: Self-pay | Admitting: Pulmonary Disease

## 2022-09-29 ENCOUNTER — Ambulatory Visit: Payer: Federal, State, Local not specified - PPO | Admitting: Pulmonary Disease

## 2022-09-29 VITALS — BP 130/60 | HR 74 | Temp 97.8°F | Ht 61.0 in | Wt 192.4 lb

## 2022-09-29 DIAGNOSIS — R0602 Shortness of breath: Secondary | ICD-10-CM | POA: Diagnosis not present

## 2022-09-29 NOTE — Progress Notes (Signed)
Synopsis: Referred in by Morgan Quitter, PA   Subjective:   PATIENT ID: Morgan [name] GENDER: female DOB: Jan 20, 1966, MRN: [id number]  Chief Complaint  Patient presents with   pulmonary consult    SOB with exertion and at rest and dry cough.     HPI Morgan [name] is a 57 year old female patient with a past medical history of paroxysmal A-fib on Eliquis, type 2 diabetes mellitus insulin-dependent, hypertension, hyperlipidemia and bipolar affective disorder presenting today to the pulmonary clinic for ongoing chronic cough.  She reports that over the past 3 months she has been having shortness of breath at rest but specifically on exertion.  She can go up 2 flight of stairs before she needs to take rest.  She does have chest tightness however no wheezing.  It is associated with a dry cough mostly at night when she lays flat.  She uses 2 pillows to sleep.  She denies any paroxysmal nocturnal dyspnea.  It is also associated with intermittent chest pain localized retrosternally.  She denies any acid taste in mouth however does report dysphagia, feeling of food stuck mid chest as well as fluid stuck in the chest but less commonly.  She denies any weight loss but has minimal loss of appetite.  She does not have any hypersensitivity to strong perfumes or cold air.  She has had 3 CTA of the chest in the past 7 months.  First was in January when she presented for hypotension and was noted to have a right upper lobe opacity.  Following that she presented in March for chest pain and was noted to be in A-fib with RVR and a CTA was also done that showed right lower lobe groundglass opacity.  And the latest in June that showed right lower lobe groundglass opacity.  Her esophagus was patulous.   Family history -she denies any family history of lung diseases  Social history -she is a never smoker denies alcohol use and denies illicit drug use including marijuana.  She is a stay-at-home wife.  Has 1 son who  is healthy.  She does have 1 dog at home.   ROS All systems were reviewed and are negative except for the above. Objective:   Vitals:   09/29/22 0831  BP: 130/60  Pulse: 74  Temp: 97.8 F (36.6 C)  TempSrc: Temporal  SpO2: 96%  Weight: 192 lb 6.4 oz (87.3 kg)  Height: 5\' 1"  (1.549 m)   96% on RA BMI Readings from Last 3 Encounters:  09/29/22 36.35 kg/m  09/09/22 35.29 kg/m  09/07/22 34.96 kg/m   Wt Readings from Last 3 Encounters:  09/29/22 192 lb 6.4 oz (87.3 kg)  09/09/22 186 lb 12 oz (84.7 kg)  09/07/22 185 lb (83.9 kg)    Physical Exam GEN: NAD, Obese.  HEENT: Supple Neck, Reactive Pupils, EOMI  CVS: Normal S1, Normal S2, RRR, No murmurs or ES appreciated  Lungs: Clear bilateral air entry.  Abdomen: Soft, non tender, distended, + BS  Extremities: Warm and well perfused, No edema  Skin: No suspicious lesions appreciated  Psych: Normal Affect  Ancillary Information   CBC    Component Value Date/Time   WBC 9.4 09/07/2022 1252   RBC 5.64 (H) 09/07/2022 1252   HGB 15.1 (H) 09/07/2022 1252   HGB 14.7 06/23/2022 1021   HCT 46.8 (H) 09/07/2022 1252   HCT 45.1 06/23/2022 1021   PLT 241 09/07/2022 1252   PLT 186 06/23/2022 1021   MCV 83.0  09/07/2022 1252   MCV 82 06/23/2022 1021   MCV 74 (L) 11/02/2013 1341   MCH 26.8 09/07/2022 1252   MCHC 32.3 09/07/2022 1252   RDW 14.1 09/07/2022 1252   RDW 14.3 06/23/2022 1021   RDW 18.3 (H) 11/02/2013 1341   LYMPHSABS 2.1 09/07/2022 1252   LYMPHSABS 2.3 06/23/2022 1021   LYMPHSABS 1.7 11/02/2013 1341   MONOABS 0.3 09/07/2022 1252   MONOABS 0.2 11/02/2013 1341   EOSABS 0.3 09/07/2022 1252   EOSABS 0.2 06/23/2022 1021   EOSABS 0.3 11/02/2013 1341   BASOSABS 0.1 09/07/2022 1252   BASOSABS 0.1 06/23/2022 1021   BASOSABS 0.1 11/02/2013 1341    Imaging   CTA Chest 07/28/2022:  RLL GGO, patulous esophagus otherwise no parenchymal or central airway diseaes.   Echocardiogram 04/28/2022: 1. Left ventricular  ejection fraction, by estimation, is 60 to 65%. The left ventricle has normal function. The left ventricle has no regional wall motion abnormalities. There is mild left ventricular hypertrophy.  Left ventricular diastolic parameters were normal.  2. Right ventricular systolic function is normal. The right ventricular  size is normal. There is normal pulmonary artery systolic pressure. The estimated right ventricular systolic pressure is 25.5 mmHg.   3. The mitral valve is normal in structure. Trivial mitral valve  regurgitation. No evidence of mitral stenosis.   4. The aortic valve is tricuspid. Aortic valve regurgitation is not  visualized. No aortic stenosis is present.   5. The inferior vena cava is normal in size with greater than 50%        No data to display           Assessment & Plan:  Morgan [name] is a 57 year old female patient with a past medical history of paroxysmal A-fib on Eliquis, type 2 diabetes mellitus insulin-dependent, hypertension, hyperlipidemia and bipolar affective disorder presenting today to the pulmonary clinic for ongoing chronic cough.  #Chronic Cough #Dyspnea on exertion #GERD She is presenting with dry cough mostly at night associated with dyspnea on exertion however her history does not hint towards towards asthma.  Also COPD is less likely as she was a never smoker.  Her CT findings with recurrent groundglass opacities in the right upper lobe and right lower lobe with a patulous esophagus raises concern for aspiration events or acid reflux.  To add to that being overweight is definitely contributing to her shortness of breath and worsening acid reflux.  []  Pulmonary function test []  Discussed importance of lifestyle modification including abstinence from p.o. 2 hours before bedtime.  Using 2 pillows for sleep.  And abstinence from high acid content food including coffee chocolate lemon lime oranges. []  We will trial increased dose of PPI omeprazole 40 mg  p.o. twice a day for 1 month and then 40 mg p.o. daily from there.  # Paroxysmal A-fib currently on Eliquis metoprolol tartrate 25 mg twice daily. # Type 2 diabetes mellitus on dapagliflozin metformin and NovoLog. # Hyperlipidemia on rosuvastatin. # Bipolar disorder on aripiprazole 15 mg nightly.   Return in about 3 months (around 12/30/2022).  I spent 60 minutes caring for this patient today, including preparing to see the patient, obtaining a medical history , reviewing a separately obtained history, performing a medically appropriate examination and/or evaluation, counseling and educating the patient/family/caregiver, ordering medications, tests, or procedures, documenting clinical information in the electronic health record, and independently interpreting results (not separately reported/billed) and communicating results to the patient/family/caregiver  Janann Colonel, MD [facility] 09/29/2022 9:21  AM

## 2022-09-30 ENCOUNTER — Other Ambulatory Visit: Payer: Self-pay | Admitting: Family Medicine

## 2022-09-30 MED ORDER — ONDANSETRON 4 MG PO TBDP: 4.0000 mg | ORAL_TABLET | Freq: Three times a day (TID) | ORAL | 0 refills | Status: DC | PRN

## 2022-09-30 MED ORDER — OXYCODONE HCL 5 MG PO TABS: 5.0000 mg | ORAL_TABLET | Freq: Four times a day (QID) | ORAL | 0 refills | Status: AC | PRN

## 2022-09-30 NOTE — Progress Notes (Signed)
Sent in additional zofran and oxycodone for abd pain.  Working on referral for fast Gastroenterology appt.

## 2022-10-01 NOTE — Telephone Encounter (Signed)
Contacted pt and she reached out to [facility] to see when they could get a new referral in and it was going to be in October so she is going to keep the appointment she currently has.

## 2022-10-05 ENCOUNTER — Ambulatory Visit: Payer: Federal, State, Local not specified - PPO | Admitting: Nurse Practitioner

## 2022-10-05 NOTE — Progress Notes (Deleted)
Cardiology Office Note:    Date:  10/05/2022   ID:  Morgan [name], DOB 1965/10/25, MRN [id number]  PCP:  [name], PA   [facility] Providers Cardiologist:  Lance Muss, MD     Referring MD: [name], MD   Chief Complaint: hospital follow-up hypotension  History of Present Illness:    Morgan [name] is a very pleasant 57 y.o. female with a hx of palpitations, hypertension, atrial flutter, aortic atherosclerosis, obesity, and diabetes currently on insulin.   History of CT in 2019 which showed CAC score of 0. In April 2022 she was seen in ER and diagnosed with chest wall pain.  She had prolonged chest pain for 6 hours.  In May 2022 she complained of intermittent palpitations.  ZIO monitor revealed rare PACs, PVCs as well as A-fib with burden of less than 1%.  Longest event was 49 minutes and 13 seconds. There was also a more regular arrhythmia likely atrial flutter.  Seen by Dr. Johney Frame on 11/03/2020 and started on Eliquis for CHA2DS2-VASc score of 3.  Seen by A-fib clinic on 12/30/2021.  She reported improvement in palpitations.  She had stopped Eliquis due to cost concerns.  Reported workup for chest pain in ED 08/2021, negative for ACS.  She was maintaining sinus rhythm at that visit.  Restarted on Eliquis 5 mg twice daily with samples and co-pay card given. Weight loss was encouraged.  She was advised to follow-up in 6 months.  Last cardiology clinic visit was 04/08/2022 with Dr. Eldridge Dace. There was a concern that low blood pressure prompted ischemic colitis in January 2024 (admission 1/16-1/18/24). Losartan was decreased to 50 mg daily. Advised to return in 6 months for follow-up.  Admitted to [facility] 3/13-3/14-24 for severe hypotension. SBP 60s on home cuff.  CTA of chest revealed nearly resolved RUL PNA/negative for PE. While in ED she became profoundly bradycardia in addition to hypotensive with HR 30s sustained.  She was admitted to ICU.  2D echo revealed normal  LVEF, significant valve disease. She had recently resumed medications after a brief hiatus. Outpatient follow-up recommended.   Seen by me on 05/04/22 for hospital follow-up. Since hospital discharge, continues to feel fatigued, not much improvement. Reports she was eating and drinking prior to admission but had recently resumed taking antihypertensives on a daily basis. On 04/28/22 she woke up and felt "off," BP readings were in the 50s. Called EMS and BP was in the 70s, she was transported to ED. Since coming home, no BP readings <100 mmHg. BP has been slowly increasing, now consistently 140s or higher. Has chronic chest pain that sometimes radiates down left arm.  Sometimes feels numbness from her elbow to her hand. No associated SOB, n/v, diaphoresis. Feels her heart beating faster when she is lying down 2-3 x per week, no significant palpitations.  She denied orthopnea, PND, edema, presyncope, syncope.  She was advised to resume diltiazem 120 mg daily.   She contacted our office 05/19/22 to report BP readings.  She was advised to discontinue diltiazem and start Lopressor 12.5 mg twice daily. She has continued to have fluctuations in BP with low readings in the mornings and elevated readings in the evenings.  Past Medical History:  Diagnosis Date   Anxiety    Atrial fibrillation (HCC)    Bipolar disorder (HCC)    Breast mass    right, removed and benign   Cancer (HCC) 09/16/2019   ovarian ca no radiation no chemo  Cervical dysplasia    Diabetes mellitus without complication (HCC)    Endometriosis    GERD (gastroesophageal reflux disease)    Hypercholesteremia    Hypertension    Migraine    Ovarian cancer (HCC)    Pelvic kidney    left   Postpartum depression    hx of    Past Surgical History:  Procedure Laterality Date   BACK SURGERY  02/16/2012   BREAST BIOPSY Right 2016   BREAST BIOPSY Right 2020   BREAST EXCISIONAL BIOPSY Right 2021   BREAST EXCISIONAL BIOPSY Right 2016    benign   BREAST LUMPECTOMY WITH RADIOACTIVE SEED LOCALIZATION Right 10/28/2014   Procedure: RIGHT BREAST LUMPECTOMY WITH RADIOACTIVE SEED LOCALIZATION;  Surgeon: Chevis Pretty III, MD;  Location: [facility];  Service: General;  Laterality: Right;   BREAST LUMPECTOMY WITH RADIOACTIVE SEED LOCALIZATION Right 06/18/2019   Procedure: RIGHT BREAST RADIOACTIVE SEED LOCALIZATION LUMPECTOMY;  Surgeon: Griselda Miner, MD;  Location: [facility];  Service: General;  Laterality: Right;   BREAST SURGERY N/A    Phreesia 07/30/2019   CESAREAN SECTION  02/15/2001   COLONOSCOPY WITH PROPOFOL N/A 05/01/2018   Procedure: COLONOSCOPY WITH PROPOFOL;  Surgeon: Wyline Mood, MD;  Location: [facility] ENDOSCOPY;  Service: Gastroenterology;  Laterality: N/A;   COLPOSCOPY     ESOPHAGOGASTRODUODENOSCOPY (EGD) WITH PROPOFOL N/A 05/01/2018   Procedure: ESOPHAGOGASTRODUODENOSCOPY (EGD) WITH PROPOFOL;  Surgeon: Wyline Mood, MD;  Location: [facility] ENDOSCOPY;  Service: Gastroenterology;  Laterality: N/A;   ESOPHAGOGASTRODUODENOSCOPY (EGD) WITH PROPOFOL N/A 11/13/2018   Procedure: ESOPHAGOGASTRODUODENOSCOPY (EGD) WITH PROPOFOL;  Surgeon: Wyline Mood, MD;  Location: [facility] ENDOSCOPY;  Service: Gastroenterology;  Laterality: N/A;   GYNECOLOGIC CRYOSURGERY     NECK SURGERY  02/15/2010   PELVIC LAPAROSCOPY  02/15/1989   TOTAL ABDOMINAL HYSTERECTOMY N/A    Phreesia 07/30/2019    Current Medications: No outpatient medications have been marked as taking for the 10/05/22 encounter (Appointment) with Lissa Hoard Zachary George, NP.     Allergies:   Clindamycin/lincomycin, Lamictal [lamotrigine], Banana, Lipitor [atorvastatin], and Pineapple   Social History   Socioeconomic History   Marital status: Married    Spouse name: Not on file   Number of children: Not on file   Years of education: Not on file   Highest education level: Not on file  Occupational History   Not on file  Tobacco Use   Smoking status: Never     Passive exposure: Never   Smokeless tobacco: Never   Tobacco comments:    Never smoke 07/01/21  Vaping Use   Vaping status: Never Used  Substance and Sexual Activity   Alcohol use: No   Drug use: No   Sexual activity: Not on file    Comment: vasectomy  Other Topics Concern   Not on file  Social History Narrative   Lives in [place] with spouse and son.   Disabled.   Social Determinants of Health   Financial Resource Strain: Not on file  Food Insecurity: No Food Insecurity (04/29/2022)   Hunger Vital Sign    Worried About Running Out of Food in the Last Year: Never true    Ran Out of Food in the Last Year: Never true  Transportation Needs: Not on file  Physical Activity: Not on file  Stress: Not on file  Social Connections: Not on file     Family History: The patient's family history includes Alcohol abuse in her father; Breast cancer (age of onset: 85) in her  mother; Depression in her maternal grandmother and mother; Diabetes in her maternal grandmother and mother; Heart attack (age of onset: 11) in her father; Heart disease in her father; Hypertension in her father; Stroke in her father; Throat cancer in her father; Thyroid disease in her mother; Uterine cancer in her maternal grandmother.  ROS:   Please see the history of present illness.    + fatigue *** All other systems reviewed and are negative.  Labs/Other Studies Reviewed:    The following studies were reviewed today:  Cardiac Studies & Procedures     STRESS TESTS  NM MYOCAR MULTI W/SPECT W 06/15/2017  Narrative  There was no ST segment deviation noted during stress.  The left ventricular ejection fraction is normal (55-65%).  Poor image quality with decreased counts throughout myocardium during stress in comparison to rest images  There is evidence of transient ischemic dilatation with an increased TID of 1.33 which can be seen with multivessel CAD  Consider coronary CTA with FFR    ECHOCARDIOGRAM  ECHOCARDIOGRAM COMPLETE 04/28/2022  Narrative ECHOCARDIOGRAM REPORT  IMPRESSIONS   1. Left ventricular ejection fraction, by estimation, is 60 to 65%. The left ventricle has normal function. The left ventricle has no regional wall motion abnormalities. There is mild left ventricular hypertrophy. Left ventricular diastolic parameters were normal. 2. Right ventricular systolic function is normal. The right ventricular size is normal. There is normal pulmonary artery systolic pressure. The estimated right ventricular systolic pressure is 25.5 mmHg. 3. The mitral valve is normal in structure. Trivial mitral valve regurgitation. No evidence of mitral stenosis. 4. The aortic valve is tricuspid. Aortic valve regurgitation is not visualized. No aortic stenosis is present. 5. The inferior vena cava is normal in size with greater than 50% respiratory variability, suggesting right atrial pressure of 3 mmHg.  FINDINGS Left Ventricle: Left ventricular ejection fraction, by estimation, is 60 to 65%. The left ventricle has normal function. The left ventricle has no regional wall motion abnormalities. The left ventricular internal cavity size was normal in size. There is mild left ventricular hypertrophy. Left ventricular diastolic parameters were normal.  Right Ventricle: The right ventricular size is normal. No increase in right ventricular wall thickness. Right ventricular systolic function is normal. There is normal pulmonary artery systolic pressure. The tricuspid regurgitant velocity is 2.37 m/s, and with an assumed right atrial pressure of 3 mmHg, the estimated right ventricular systolic pressure is 25.5 mmHg.  Left Atrium: Left atrial size was normal in size.  Right Atrium: Right atrial size was normal in size.  Pericardium: There is no evidence of pericardial effusion.  Mitral Valve: The mitral valve is normal in structure. Trivial mitral valve regurgitation. No evidence of  mitral valve stenosis. MV peak gradient, 5.6 mmHg. The mean mitral valve gradient is 2.0 mmHg.  Tricuspid Valve: The tricuspid valve is normal in structure. Tricuspid valve regurgitation is trivial. No evidence of tricuspid stenosis.  Aortic Valve: The aortic valve is tricuspid. Aortic valve regurgitation is not visualized. No aortic stenosis is present. Aortic valve mean gradient measures 6.0 mmHg. Aortic valve peak gradient measures 13.4 mmHg. Aortic valve area, by VTI measures 2.59 cm.  Pulmonic Valve: The pulmonic valve was normal in structure. Pulmonic valve regurgitation is trivial. No evidence of pulmonic stenosis.  Aorta: The aortic root is normal in size and structure.  Venous: The inferior vena cava is normal in size with greater than 50% respiratory variability, suggesting right atrial pressure of 3 mmHg.  IAS/Shunts: No atrial level  shunt detected by color flow Doppler.    MONITORS  LONG TERM MONITOR (3-14 DAYS) 07/29/2020  Narrative  Normal sinus rhythm with rare PACs and PVCs.  Intermittent atrial fibrillation with occasional rapid ventricular response, overall < 1% burden.  Several short episodes of AFib noted.   Patch Wear Time:  12 days and 14 hours (2022-05-28T20:13:38-0400 to 2022-06-10T10:23:04-398)  Patient had a min HR of 65 bpm, max HR of 203 bpm, and avg HR of 100 bpm. Predominant underlying rhythm was Sinus Rhythm. 2 Supraventricular Tachycardia runs occurred, the run with the fastest interval lasting 4 beats with a max rate of 203 bpm, the longest lasting 5 beats with an avg rate of 131 bpm. Atrial Fibrillation occurred (<1% burden), ranging from 103-195 bpm (avg of 144 bpm), the longest lasting 49 mins 13 secs with an avg rate of 144 bpm. Isolated SVEs were rare (<1.0%), SVE Couplets were rare (<1.0%), and SVE Triplets were rare (<1.0%). No Isolated VEs, VE Couplets, or VE Triplets were present.   CT SCANS  CT CORONARY MORPH W/CTA COR W/SCORE  06/17/2017  Addendum 06/17/2017  8:09 AM ADDENDUM REPORT: 06/17/2017 08:07  EXAM: OVER-READ INTERPRETATION  CT CHEST  The following report is an over-read performed by radiologist Dr. Noe Gens [facility] on 06/17/2017. This over-read does not include interpretation of cardiac or coronary anatomy or pathology. The coronary CTA interpretation by the cardiologist is attached.  COMPARISON:  None.  FINDINGS: Heart is normal size. Visualized aorta is normal caliber. No adenopathy in the lower mediastinum or hila.  Minimal dependent atelectasis.  No effusions.  Diffuse fatty infiltration of the liver. Chest wall soft tissues are unremarkable. No acute bony abnormality.  IMPRESSION: Fatty infiltration of the liver.  No acute extra cardiac abnormality.   Electronically Signed By: Charlett Nose M.D. On: 06/17/2017 08:07  Narrative CLINICAL DATA:  57 year old female with atypical chest pain and equivocal stress test  EXAM: Cardiac/Coronary  CT  TECHNIQUE: The patient was scanned on a [employer].  FINDINGS: A 120 kV prospective scan was triggered in the descending thoracic aorta at 111 HU's. Axial non-contrast 3 mm slices were carried out through the heart. The data set was analyzed on a dedicated work station and scored using the Agatson method. Gantry rotation speed was 250 msecs and collimation was .6 mm. No beta blockade and 0.8 mg of sl NTG was given. The 3D data set was reconstructed in 5% intervals of the 67-82 % of the R-R cycle. Diastolic phases were analyzed on a dedicated work station using MPR, MIP and VRT modes. The patient received 80 cc of contrast.  Aorta:  Normal size.  No calcifications.  No dissection.  Aortic Valve:  Trileaflet.  No calcifications.  Coronary Arteries:  Normal coronary origin.  Right dominance.  RCA is a very large dominant artery that gives rise to PDA and PLVB. There is no plaque.  Left main is a large  artery that gives rise to LAD, ramus intermedius and LCX arteries. Left main has no plaque.  LAD is a medium size vessel that has no plaque.  RI is a medium size artery that has no plaque.  LCX is a medium size non-dominant artery that has no plaque.  Other findings:  Normal pulmonary vein drainage into the left atrium.  Normal let atrial appendage without a thrombus.  IMPRESSION: 1. Coronary calcium score of 0. This was 0 percentile for age and sex matched control.  2. Normal coronary origin  with right dominance.  3. No evidence of CAD.  4. Mildly dilated pulmonary artery measuring 31 mm suggestive of pulmonary hypertension.  Electronically Signed: By: Tobias Alexander On: 06/16/2017 15:46           Recent Labs: 06/23/2022: TSH 1.070 07/28/2022: Magnesium 1.9 09/07/2022: ALT 30; BUN 9; Creatinine, Ser 0.67; Hemoglobin 15.1; Platelets 241; Potassium 3.9; Sodium 141  Recent Lipid Panel    Component Value Date/Time   CHOL 135 06/23/2022 1021   TRIG 177 (H) 06/23/2022 1021   HDL 40 06/23/2022 1021   CHOLHDL 3.4 06/23/2022 1021   CHOLHDL 3.7 06/16/2017 0417   VLDL 29 06/16/2017 0417   LDLCALC 65 06/23/2022 1021     Risk Assessment/Calculations:    CHA2DS2-VASc Score = 3  {This indicates a 3.2% annual risk of stroke. The patient's score is based upon: CHF History: 0 HTN History: 1 Diabetes History: 1 Stroke History: 0 Vascular Disease History: 0 Age Score: 0 Gender Score: 1    Physical Exam:    VS:  LMP 05/12/2011     Wt Readings from Last 3 Encounters:  09/29/22 192 lb 6.4 oz (87.3 kg)  09/09/22 186 lb 12 oz (84.7 kg)  09/07/22 185 lb (83.9 kg)     GEN:  Well nourished, well developed in no acute distress HEENT: Normal NECK: No JVD; No carotid bruits CARDIAC: RRR, no murmurs, rubs, gallops RESPIRATORY:  Clear to auscultation without rales, wheezing or rhonchi  ABDOMEN: Soft, non-tender, non-distended MUSCULOSKELETAL:  No edema; No deformity. 2+  pedal pulses, equal bilaterally SKIN: Warm and dry NEUROLOGIC:  Alert and oriented x 3 PSYCHIATRIC:  Normal affect   EKG:  EKG is not ordered today.    No BP recorded.  {Refresh Note OR Click here to enter BP  :1}***  Diagnoses:    No diagnosis found.  Assessment and Plan:     PAF on chronic anticoagulation: Clinically appears to be in sinus rhythm today.  HR is well-controlled. No bleeding concerns. Now has PA for Eliquis so no concerns with cost. Continue Eliquis 5 mg twice daily which is appropriate dose. Restarting diltiazem as noted below. Recommend follow-up in A Fib clinic in 2 months.   Hypotension: History of essential hypertension with profound hypotension 04/2022 requiring admission. Antihypertensives were held at d/c. Since discharge, BP has been gradually increasing. We will restart diltiazem 120 mg daily.  I have asked her to track BP and report back to [place] in 2 weeks. Encouraged heart healthy, low sodium diet.   Aortic atherosclerosis/hyperlipidemia: LDL 80 on 04/07/2021.  She was started on rosuvastatin 10 mg February 2024 by Dr. Eldridge Dace. Has lab appointment in May for repeat  lipid testing.   Chest pain: Chronic history of atypical chest pain. Pain is not exertional. Is occasionally accompanied by left arm numbness, no associated SOB, diaphoresis, n/v. No obvious triggers. Also having fatigue since hospital d/c. Recent echo with normal LVEF, no regional wall motion abnormalities. Coronary CTA with calcium score of 0, no evidence of CAD 06/2017. Could consider repeat CT. Advised her to continue to monitor and report if symptoms worsen.      Disposition: ***  Medication Adjustments/Labs and Tests Ordered: Current medicines are reviewed at length with the patient today.  Concerns regarding medicines are outlined above.  No orders of the defined types were placed in this encounter.  No orders of the defined types were placed in this encounter.   There are no Patient  Instructions on file for  this visit.   Signed, Levi Aland, NP  10/05/2022 6:45 AM    [facility]

## 2022-10-12 ENCOUNTER — Ambulatory Visit: Payer: Federal, State, Local not specified - PPO | Admitting: General Practice

## 2022-10-12 NOTE — Progress Notes (Deleted)
Cardiology Office Note:    Date:  10/12/2022   ID:  Morgan [name], DOB 1965/04/07, MRN [id number]  PCP:  [name], PA   [facility] Providers Cardiologist:  Lance Muss, MD     Referring MD: [name], MD   Chief Complaint: hospital follow-up hypotension  History of Present Illness:    Pamlea R [name] is a very pleasant 57 y.o. female with a hx of palpitations, hypertension, atrial flutter, aortic atherosclerosis, obesity, and diabetes currently on insulin.   History of CT in 2019 which showed CAC score of 0. In April 2022 she was seen in ER and diagnosed with chest wall pain.  She had prolonged chest pain for 6 hours.  In May 2022 she complained of intermittent palpitations.  ZIO monitor revealed rare PACs, PVCs as well as A-fib with burden of less than 1%.  Longest event was 49 minutes and 13 seconds. There was also a more regular arrhythmia likely atrial flutter.  Seen by Dr. Johney Frame on 11/03/2020 and started on Eliquis for CHA2DS2-VASc score of 3.  Seen by A-fib clinic on 12/30/2021, reported improvement in palpitations.  She had stopped Eliquis due to cost concerns.  Reported workup for chest pain in ED 08/2021, negative for ACS.  She was maintaining sinus rhythm at that visit.  Restarted on Eliquis 5 mg twice daily with samples and co-pay card given. Weight loss was encouraged.  She was advised to follow-up in 6 months.  Last cardiology clinic visit was 04/08/2022 with Dr. Eldridge Dace. There was a concern that low blood pressure prompted ischemic colitis in January 2024 (admission 1/16-1/18/24). Losartan was decreased to 50 mg daily. Advised to return in 6 months for follow-up.  Admitted to [facility] 3/13-3/14-24 for severe hypotension. SBP 60s on home cuff.  CTA of chest revealed nearly resolved RUL PNA/negative for PE. While in ED she became profoundly bradycardia in addition to hypotensive with HR 30s sustained.  She was admitted to ICU.  2D echo revealed normal LVEF,  significant valve disease. She had recently resumed medications after a brief hiatus. Outpatient follow-up recommended.   Seen by me on 05/04/22 for hospital follow-up. Still feeling fatigued since hospital discharge. Reported she was eating and drinking prior to admission but had recently resumed taking antihypertensives on a daily basis. On 04/28/22 she woke up and felt "off," BP readings were in the 50s. Called EMS and BP was in the 70s, she was transported to ED. Since coming home, no BP readings <100 mmHg. BP has been slowly increasing, now consistently 140s or higher. Has chronic chest pain that sometimes radiates down left arm.  Sometimes feels numbness from her elbow to her hand. No associated SOB, n/v, diaphoresis. Feels her heart beating faster when she is lying down 2-3 x per week, no significant palpitations.  She denied orthopnea, PND, edema, presyncope, syncope. Was advised to resume diltiazem 120 mg daily.   She contacted our office 05/19/22 to report BP readings.  She was advised to discontinue diltiazem and start Lopressor 12.5 mg twice daily. She has continued to have fluctuations in BP with low readings in the mornings and elevated readings in the evenings. She was advised   Today, she is here   Past Medical History:  Diagnosis Date   Anxiety    Atrial fibrillation (HCC)    Bipolar disorder (HCC)    Breast mass    right, removed and benign   Cancer (HCC) 09/16/2019   ovarian ca no radiation no  chemo   Cervical dysplasia    Diabetes mellitus without complication (HCC)    Endometriosis    GERD (gastroesophageal reflux disease)    Hypercholesteremia    Hypertension    Migraine    Ovarian cancer (HCC)    Pelvic kidney    left   Postpartum depression    hx of    Past Surgical History:  Procedure Laterality Date   BACK SURGERY  02/16/2012   BREAST BIOPSY Right 2016   BREAST BIOPSY Right 2020   BREAST EXCISIONAL BIOPSY Right 2021   BREAST EXCISIONAL BIOPSY Right 2016    benign   BREAST LUMPECTOMY WITH RADIOACTIVE SEED LOCALIZATION Right 10/28/2014   Procedure: RIGHT BREAST LUMPECTOMY WITH RADIOACTIVE SEED LOCALIZATION;  Surgeon: Chevis Pretty III, MD;  Location: [facility];  Service: General;  Laterality: Right;   BREAST LUMPECTOMY WITH RADIOACTIVE SEED LOCALIZATION Right 06/18/2019   Procedure: RIGHT BREAST RADIOACTIVE SEED LOCALIZATION LUMPECTOMY;  Surgeon: Griselda Miner, MD;  Location: [facility];  Service: General;  Laterality: Right;   BREAST SURGERY N/A    Phreesia 07/30/2019   CESAREAN SECTION  02/15/2001   COLONOSCOPY WITH PROPOFOL N/A 05/01/2018   Procedure: COLONOSCOPY WITH PROPOFOL;  Surgeon: Wyline Mood, MD;  Location: [facility]COPY;  Service: Gastroenterology;  Laterality: N/A;   COLPOSCOPY     ESOPHAGOGASTRODUODENOSCOPY (EGD) WITH PROPOFOL N/A 05/01/2018   Procedure: ESOPHAGOGASTRODUODENOSCOPY (EGD) WITH PROPOFOL;  Surgeon: Wyline Mood, MD;  Location: [facility] ENDOSCOPY;  Service: Gastroenterology;  Laterality: N/A;   ESOPHAGOGASTRODUODENOSCOPY (EGD) WITH PROPOFOL N/A 11/13/2018   Procedure: ESOPHAGOGASTRODUODENOSCOPY (EGD) WITH PROPOFOL;  Surgeon: Wyline Mood, MD;  Location: [facility] ENDOSCOPY;  Service: Gastroenterology;  Laterality: N/A;   GYNECOLOGIC CRYOSURGERY     NECK SURGERY  02/15/2010   PELVIC LAPAROSCOPY  02/15/1989   TOTAL ABDOMINAL HYSTERECTOMY N/A    Phreesia 07/30/2019    Current Medications: No outpatient medications have been marked as taking for the 10/14/22 encounter (Appointment) with Lissa Hoard Zachary George, NP.     Allergies:   Clindamycin/lincomycin, Lamictal [lamotrigine], Banana, Lipitor [atorvastatin], and Pineapple   Social History   Socioeconomic History   Marital status: Married    Spouse name: Not on file   Number of children: Not on file   Years of education: Not on file   Highest education level: Not on file  Occupational History   Not on file  Tobacco Use   Smoking status: Never     Passive exposure: Never   Smokeless tobacco: Never   Tobacco comments:    Never smoke 07/01/21  Vaping Use   Vaping status: Never Used  Substance and Sexual Activity   Alcohol use: No   Drug use: No   Sexual activity: Not on file    Comment: vasectomy  Other Topics Concern   Not on file  Social History Narrative   Lives in [place] with spouse and son.   Disabled.   Social Determinants of Health   Financial Resource Strain: Not on file  Food Insecurity: No Food Insecurity (04/29/2022)   Hunger Vital Sign    Worried About Running Out of Food in the Last Year: Never true    Ran Out of Food in the Last Year: Never true  Transportation Needs: Not on file  Physical Activity: Not on file  Stress: Not on file  Social Connections: Not on file     Family History: The patient's family history includes Alcohol abuse in her father; Breast cancer (age of onset:  48) in her mother; Depression in her maternal grandmother and mother; Diabetes in her maternal grandmother and mother; Heart attack (age of onset: 32) in her father; Heart disease in her father; Hypertension in her father; Stroke in her father; Throat cancer in her father; Thyroid disease in her mother; Uterine cancer in her maternal grandmother.  ROS:   Please see the history of present illness.    + fatigue *** All other systems reviewed and are negative.  Labs/Other Studies Reviewed:    The following studies were reviewed today:  Cardiac Studies & Procedures     STRESS TESTS  NM MYOCAR MULTI W/SPECT W 06/15/2017  Narrative  There was no ST segment deviation noted during stress.  The left ventricular ejection fraction is normal (55-65%).  Poor image quality with decreased counts throughout myocardium during stress in comparison to rest images  There is evidence of transient ischemic dilatation with an increased TID of 1.33 which can be seen with multivessel CAD  Consider coronary CTA with FFR    ECHOCARDIOGRAM  ECHOCARDIOGRAM COMPLETE 04/28/2022  Narrative ECHOCARDIOGRAM REPORT  IMPRESSIONS   1. Left ventricular ejection fraction, by estimation, is 60 to 65%. The left ventricle has normal function. The left ventricle has no regional wall motion abnormalities. There is mild left ventricular hypertrophy. Left ventricular diastolic parameters were normal. 2. Right ventricular systolic function is normal. The right ventricular size is normal. There is normal pulmonary artery systolic pressure. The estimated right ventricular systolic pressure is 25.5 mmHg. 3. The mitral valve is normal in structure. Trivial mitral valve regurgitation. No evidence of mitral stenosis. 4. The aortic valve is tricuspid. Aortic valve regurgitation is not visualized. No aortic stenosis is present. 5. The inferior vena cava is normal in size with greater than 50% respiratory variability, suggesting right atrial pressure of 3 mmHg.  FINDINGS Left Ventricle: Left ventricular ejection fraction, by estimation, is 60 to 65%. The left ventricle has normal function. The left ventricle has no regional wall motion abnormalities. The left ventricular internal cavity size was normal in size. There is mild left ventricular hypertrophy. Left ventricular diastolic parameters were normal.  Right Ventricle: The right ventricular size is normal. No increase in right ventricular wall thickness. Right ventricular systolic function is normal. There is normal pulmonary artery systolic pressure. The tricuspid regurgitant velocity is 2.37 m/s, and with an assumed right atrial pressure of 3 mmHg, the estimated right ventricular systolic pressure is 25.5 mmHg.  Left Atrium: Left atrial size was normal in size.  Right Atrium: Right atrial size was normal in size.  Pericardium: There is no evidence of pericardial effusion.  Mitral Valve: The mitral valve is normal in structure. Trivial mitral valve regurgitation. No evidence of  mitral valve stenosis. MV peak gradient, 5.6 mmHg. The mean mitral valve gradient is 2.0 mmHg.  Tricuspid Valve: The tricuspid valve is normal in structure. Tricuspid valve regurgitation is trivial. No evidence of tricuspid stenosis.  Aortic Valve: The aortic valve is tricuspid. Aortic valve regurgitation is not visualized. No aortic stenosis is present. Aortic valve mean gradient measures 6.0 mmHg. Aortic valve peak gradient measures 13.4 mmHg. Aortic valve area, by VTI measures 2.59 cm.  Pulmonic Valve: The pulmonic valve was normal in structure. Pulmonic valve regurgitation is trivial. No evidence of pulmonic stenosis.  Aorta: The aortic root is normal in size and structure.  Venous: The inferior vena cava is normal in size with greater than 50% respiratory variability, suggesting right atrial pressure of 3 mmHg.  IAS/Shunts:  No atrial level shunt detected by color flow Doppler.    MONITORS  LONG TERM MONITOR (3-14 DAYS) 07/29/2020  Narrative  Normal sinus rhythm with rare PACs and PVCs.  Intermittent atrial fibrillation with occasional rapid ventricular response, overall < 1% burden.  Several short episodes of AFib noted.   Patch Wear Time:  12 days and 14 hours (2022-05-28T20:13:38-0400 to 2022-06-10T10:23:04-398)  Patient had a min HR of 65 bpm, max HR of 203 bpm, and avg HR of 100 bpm. Predominant underlying rhythm was Sinus Rhythm. 2 Supraventricular Tachycardia runs occurred, the run with the fastest interval lasting 4 beats with a max rate of 203 bpm, the longest lasting 5 beats with an avg rate of 131 bpm. Atrial Fibrillation occurred (<1% burden), ranging from 103-195 bpm (avg of 144 bpm), the longest lasting 49 mins 13 secs with an avg rate of 144 bpm. Isolated SVEs were rare (<1.0%), SVE Couplets were rare (<1.0%), and SVE Triplets were rare (<1.0%). No Isolated VEs, VE Couplets, or VE Triplets were present.   CT SCANS  CT CORONARY MORPH W/CTA COR W/SCORE  06/17/2017  Addendum 06/17/2017  8:09 AM ADDENDUM REPORT: 06/17/2017 08:07  EXAM: OVER-READ INTERPRETATION  CT CHEST  The following report is an over-read performed by radiologist Dr. Noe Gens [facility] on 06/17/2017. This over-read does not include interpretation of cardiac or coronary anatomy or pathology. The coronary CTA interpretation by the cardiologist is attached.  COMPARISON:  None.  FINDINGS: Heart is normal size. Visualized aorta is normal caliber. No adenopathy in the lower mediastinum or hila.  Minimal dependent atelectasis.  No effusions.  Diffuse fatty infiltration of the liver. Chest wall soft tissues are unremarkable. No acute bony abnormality.  IMPRESSION: Fatty infiltration of the liver.  No acute extra cardiac abnormality.   Electronically Signed By: Charlett Nose M.D. On: 06/17/2017 08:07  Narrative CLINICAL DATA:  57 year old female with atypical chest pain and equivocal stress test  EXAM: Cardiac/Coronary  CT  TECHNIQUE: The patient was scanned on a [employer].  FINDINGS: A 120 kV prospective scan was triggered in the descending thoracic aorta at 111 HU's. Axial non-contrast 3 mm slices were carried out through the heart. The data set was analyzed on a dedicated work station and scored using the Agatson method. Gantry rotation speed was 250 msecs and collimation was .6 mm. No beta blockade and 0.8 mg of sl NTG was given. The 3D data set was reconstructed in 5% intervals of the 67-82 % of the R-R cycle. Diastolic phases were analyzed on a dedicated work station using MPR, MIP and VRT modes. The patient received 80 cc of contrast.  Aorta:  Normal size.  No calcifications.  No dissection.  Aortic Valve:  Trileaflet.  No calcifications.  Coronary Arteries:  Normal coronary origin.  Right dominance.  RCA is a very large dominant artery that gives rise to PDA and PLVB. There is no plaque.  Left main is a large  artery that gives rise to LAD, ramus intermedius and LCX arteries. Left main has no plaque.  LAD is a medium size vessel that has no plaque.  RI is a medium size artery that has no plaque.  LCX is a medium size non-dominant artery that has no plaque.  Other findings:  Normal pulmonary vein drainage into the left atrium.  Normal let atrial appendage without a thrombus.  IMPRESSION: 1. Coronary calcium score of 0. This was 0 percentile for age and sex matched control.  2.  Normal coronary origin with right dominance.  3. No evidence of CAD.  4. Mildly dilated pulmonary artery measuring 31 mm suggestive of pulmonary hypertension.  Electronically Signed: By: Tobias Alexander On: 06/16/2017 15:46           Recent Labs: 06/23/2022: TSH 1.070 07/28/2022: Magnesium 1.9 09/07/2022: ALT 30; BUN 9; Creatinine, Ser 0.67; Hemoglobin 15.1; Platelets 241; Potassium 3.9; Sodium 141  Recent Lipid Panel    Component Value Date/Time   CHOL 135 06/23/2022 1021   TRIG 177 (H) 06/23/2022 1021   HDL 40 06/23/2022 1021   CHOLHDL 3.4 06/23/2022 1021   CHOLHDL 3.7 06/16/2017 0417   VLDL 29 06/16/2017 0417   LDLCALC 65 06/23/2022 1021     Risk Assessment/Calculations:    CHA2DS2-VASc Score = 3  {This indicates a 3.2% annual risk of stroke. The patient's score is based upon: CHF History: 0 HTN History: 1 Diabetes History: 1 Stroke History: 0 Vascular Disease History: 0 Age Score: 0 Gender Score: 1    Physical Exam:    VS:  LMP 05/12/2011     Wt Readings from Last 3 Encounters:  09/29/22 192 lb 6.4 oz (87.3 kg)  09/09/22 186 lb 12 oz (84.7 kg)  09/07/22 185 lb (83.9 kg)     GEN:  Well nourished, well developed in no acute distress HEENT: Normal NECK: No JVD; No carotid bruits CARDIAC: RRR, no murmurs, rubs, gallops RESPIRATORY:  Clear to auscultation without rales, wheezing or rhonchi  ABDOMEN: Soft, non-tender, non-distended MUSCULOSKELETAL:  No edema; No deformity. 2+  pedal pulses, equal bilaterally SKIN: Warm and dry NEUROLOGIC:  Alert and oriented x 3 PSYCHIATRIC:  Normal affect   EKG:  EKG is not ordered today. ***  No BP recorded.  {Refresh Note OR Click here to enter BP  :1}***  Diagnoses:    No diagnosis found.  Assessment and Plan:     PAF on chronic anticoagulation: Clinically appears to be in sinus rhythm today.  HR is well-controlled. No bleeding concerns. Now has PA for Eliquis so no concerns with cost. Continue Eliquis 5 mg twice daily which is appropriate dose. Restarting diltiazem as noted below. Recommend follow-up in A Fib clinic in 2 months.   Hypotension: History of essential hypertension with profound hypotension 04/2022 requiring admission. Antihypertensives were held at d/c. Since discharge, BP has been gradually increasing. We will restart diltiazem 120 mg daily.  I have asked her to track BP and report back to [place] in 2 weeks. Encouraged heart healthy, low sodium diet.   Aortic atherosclerosis/hyperlipidemia: LDL 80 on 04/07/2021.  She was started on rosuvastatin 10 mg February 2024 by Dr. Eldridge Dace. Has lab appointment in May for repeat  lipid testing.   Chest pain: Chronic history of atypical chest pain. Pain is not exertional. Is occasionally accompanied by left arm numbness, no associated SOB, diaphoresis, n/v. No obvious triggers. Also having fatigue since hospital d/c. Recent echo with normal LVEF, no regional wall motion abnormalities. Coronary CTA with calcium score of 0, no evidence of CAD 06/2017. Could consider repeat CT. Advised her to continue to monitor and report if symptoms worsen.      Disposition: ***  Medication Adjustments/Labs and Tests Ordered: Current medicines are reviewed at length with the patient today.  Concerns regarding medicines are outlined above.  No orders of the defined types were placed in this encounter.  No orders of the defined types were placed in this encounter.   There are no Patient  Instructions on  file for this visit.   Signed, Levi Aland, NP  10/12/2022 7:48 AM    [facility]

## 2022-10-13 NOTE — Progress Notes (Signed)
Cardiology Office Note:    Date:  10/26/2022   ID:  Morgan [name], DOB [birth year], MRN [id number]  PCP:  [name], PA   [facility] Providers Cardiologist:  Lance Muss, MD     Referring MD: Morgan Manchester, MD   Chief Complaint: hospital follow-up hypotension  History of Present Illness:    Morgan [name] is a very pleasant 57 y.o. female with a hx of palpitations, hypertension, atrial flutter, aortic atherosclerosis, obesity, and diabetes currently on insulin.   History of CT in 2019 which showed CAC score of 0. In April 2022 she was seen in ER and diagnosed with chest wall pain.  She had prolonged chest pain for 6 hours.  In May 2022 she complained of intermittent palpitations.  ZIO monitor revealed rare PACs, PVCs as well as A-fib with burden of less than 1%.  Longest event was 49 minutes and 13 seconds. There was also a more regular arrhythmia likely atrial flutter.  Seen by Dr. Johney [name] on 11/03/2020 and started on Eliquis for CHA2DS2-VASc score of 3.  Seen by A-fib clinic on 12/30/2021, reported improvement in palpitations.  She had stopped Eliquis due to cost concerns.  Reported workup for chest pain in ED 08/2021, negative for ACS.  She was maintaining sinus rhythm at that visit.  Restarted on Eliquis 5 mg twice daily with samples and co-pay card given. Weight loss was encouraged.  She was advised to follow-up in 6 months.  Last cardiology clinic visit was 04/08/2022 with Dr. Eldridge [name]. There was a concern that low blood pressure prompted ischemic colitis in January 2024 (admission 1/16-1/18/24). Losartan was decreased to 50 mg daily. Advised to return in 6 months for follow-up.  Admitted to [facility] 3/13-3/14-24 for severe hypotension. SBP 60s on home cuff.  CTA of chest revealed nearly resolved RUL PNA/negative for PE. While in ED she became profoundly bradycardia in addition to hypotensive with HR 30s sustained.  She was admitted to ICU.  2D echo revealed normal LVEF,  significant valve disease. She had recently resumed medications after a brief hiatus. Outpatient follow-up recommended.   Seen by me on 05/04/22 for hospital follow-up. Still feeling fatigued since hospital discharge. Reported she was eating and drinking prior to admission but had recently resumed taking antihypertensives on a daily basis. On 04/28/22 she woke up and felt "off," BP readings were in the 50s. Called EMS and BP was in the 70s, she was transported to ED. Since coming home, no BP readings <100 mmHg. BP has been slowly increasing, now consistently 140s or higher. Has chronic chest pain that sometimes radiates down left arm.  Sometimes feels numbness from her elbow to her hand. No associated SOB, n/v, diaphoresis. Feels her heart beating faster when she is lying down 2-3 x per week, no significant palpitations.  She denied orthopnea, PND, edema, presyncope, syncope. Was advised to resume diltiazem 120 mg daily.   She contacted our office 05/19/22 to report BP readings.  She was advised to discontinue diltiazem and start Lopressor 12.5 mg twice daily. She has continued to have fluctuations in BP with low readings in the mornings and elevated readings in the evenings. She has since contacted [place] with continued reports of labile BP and HR.   Today, she is here alone for follow-up. She went to ED on 10/16/22 due to not feeling well all day and was found to have significantly elevated BP. She had frequent PACs on EKG. She continues to report chest pain that  has been persistent for several years.  She had normal troponin x 2 on 10/16/22.  Pain does not intensify with exertion but she does admit she is not very active. Can walk or do elliptical for 15 min or less then has back pain and has to stop. Got short of breath and fatigued going up and down stairs frequently on recent beach vacation.  No orthopnea, PND, edema, presyncope, syncope, dyspnea.  Past Medical History:  Diagnosis Date   Anxiety    Atrial  fibrillation (HCC)    Bipolar disorder (HCC)    Breast mass    right, removed and benign   Cancer (HCC) 09/16/2019   ovarian ca no radiation no chemo   Cervical dysplasia    Diabetes mellitus without complication (HCC)    Endometriosis    GERD (gastroesophageal reflux disease)    Hypercholesteremia    Hypertension    Migraine    Ovarian cancer (HCC)    Pelvic kidney    left   Postpartum depression    hx of    Past Surgical History:  Procedure Laterality Date   BACK SURGERY  02/16/2012   BREAST BIOPSY Right 2016   BREAST BIOPSY Right 2020   BREAST EXCISIONAL BIOPSY Right 2021   BREAST EXCISIONAL BIOPSY Right 2016   benign   BREAST LUMPECTOMY WITH RADIOACTIVE SEED LOCALIZATION Right 10/28/2014   Procedure: RIGHT BREAST LUMPECTOMY WITH RADIOACTIVE SEED LOCALIZATION;  Surgeon: Chevis Pretty III, MD;  Location: [facility];  Service: General;  Laterality: Right;   BREAST LUMPECTOMY WITH RADIOACTIVE SEED LOCALIZATION Right 06/18/2019   Procedure: RIGHT BREAST RADIOACTIVE SEED LOCALIZATION LUMPECTOMY;  Surgeon: Griselda Miner, MD;  Location: [facility];  Service: General;  Laterality: Right;   BREAST SURGERY N/A    Phreesia 07/30/2019   CESAREAN SECTION  02/15/2001   COLONOSCOPY WITH PROPOFOL N/A 05/01/2018   Procedure: COLONOSCOPY WITH PROPOFOL;  Surgeon: Wyline Mood, MD;  Location[facility];  Service: Gastroenterology;  Laterality: N/A;   COLPOSCOPY     ESOPHAGOGASTRODUODENOSCOPY (EGD) WITH PROPOFOL N/A 05/01/2018   Procedure: ESOPHAGOGASTRODUODENOSCOPY (EGD) WITH PROPOFOL;  Surgeon: Wyline Mood, MD;  Location: [facility]COPY;  Service: Gastroenterology;  Laterality: N/A;   ESOPHAGOGASTRODUODENOSCOPY (EGD) WITH PROPOFOL N/A 11/13/2018   Procedure: ESOPHAGOGASTRODUODENOSCOPY (EGD) WITH PROPOFOL;  Surgeon: Wyline Mood, MD;  Location: [facility] ENDOSCOPY;  Service: Gastroenterology;  Laterality: N/A;   GYNECOLOGIC CRYOSURGERY     NECK SURGERY  02/15/2010    PELVIC LAPAROSCOPY  02/15/1989   TOTAL ABDOMINAL HYSTERECTOMY N/A    Phreesia 07/30/2019    Current Medications: Current Meds  Medication Sig   acetaminophen (TYLENOL) 500 MG tablet Take 500 mg by mouth every 6 (six) hours as needed for mild pain.   apixaban (ELIQUIS) 5 MG TABS tablet Take 1 tablet (5 mg total) by mouth 2 (two) times daily.   ARIPiprazole (ABILIFY) 15 MG tablet Take 15 mg by mouth at bedtime.   clotrimazole (LOTRIMIN) 1 % cream Apply to affected area 2 times daily   cyclobenzaprine (FLEXERIL) 5 MG tablet Take 1 tablet (5 mg total) by mouth 2 (two) times daily as needed for muscle spasms.   Dapagliflozin Pro-metFORMIN ER (XIGDUO XR) 11-998 MG TB24 Take 1 tablet by mouth at bedtime.   DULoxetine (CYMBALTA) 60 MG capsule Take 60 mg by mouth at bedtime.   fluticasone (FLONASE) 50 MCG/ACT nasal spray Place 1 spray into both nostrils daily as needed for allergies.   Insulin Aspart FlexPen (NOVOLOG) 100 UNIT/ML  INJECT 10 UNITS INTO THE SKIN BEFORE BREAKFAST AND LUNCH. INJECT 13 UNITS INTO THE SKIN BEFORE SUPPER.   omeprazole (PRILOSEC OTC) 20 MG tablet Take 20 mg by mouth at bedtime.   ondansetron (ZOFRAN-ODT) 4 MG disintegrating tablet Take 1 tablet (4 mg total) by mouth every 8 (eight) hours as needed.   oxycodone (OXY-IR) 5 MG capsule Take 5 mg by mouth every 4 (four) hours as needed for pain.   rosuvastatin (CRESTOR) 10 MG tablet Take 1 tablet (10 mg total) by mouth daily.   tiZANidine (ZANAFLEX) 4 MG tablet Take 4 mg by mouth at bedtime as needed for muscle spasms.   [DISCONTINUED] metoprolol tartrate (LOPRESSOR) 25 MG tablet Take 0.5 tablets (12.5 mg total) by mouth 2 (two) times daily.     Allergies:   Clindamycin/lincomycin, Lamictal [lamotrigine], Banana, Lipitor [atorvastatin], and Pineapple   Social History   Socioeconomic History   Marital status: Married    Spouse name: Not on file   Number of children: Not on file   Years of education: Not on file    Highest education level: Not on file  Occupational History   Not on file  Tobacco Use   Smoking status: Never    Passive exposure: Never   Smokeless tobacco: Never   Tobacco comments:    Never smoke 07/01/21  Vaping Use   Vaping status: Never Used  Substance and Sexual Activity   Alcohol use: No   Drug use: No   Sexual activity: Not on file    Comment: vasectomy  Other Topics Concern   Not on file  Social History Narrative   Lives in [place] with spouse and son.   Disabled.   Social Determinants of Health   Financial Resource Strain: Not on file  Food Insecurity: No Food Insecurity (04/29/2022)   Hunger Vital Sign    Worried About Running Out of Food in the Last Year: Never true    Ran Out of Food in the Last Year: Never true  Transportation Needs: Not on file  Physical Activity: Not on file  Stress: Not on file  Social Connections: Not on file     Family History: The patient's family history includes Alcohol abuse in her father; Breast cancer (age of onset: 84) in her mother; Depression in her maternal grandmother and mother; Diabetes in her maternal grandmother and mother; Heart attack (age of onset: 77) in her father; Heart disease in her father; Hypertension in her father; Stroke in her father; Throat cancer in her father; Thyroid disease in her mother; Uterine cancer in her maternal grandmother.  ROS:   Please see the history of present illness.   + chest pain + labile blood pressure All other systems reviewed and are negative.  Labs/Other Studies Reviewed:    The following studies were reviewed today:  Cardiac Studies & Procedures     STRESS TESTS  NM MYOCAR MULTI W/SPECT W 06/15/2017  Narrative  There was no ST segment deviation noted during stress.  The left ventricular ejection fraction is normal (55-65%).  Poor image quality with decreased counts throughout myocardium during stress in comparison to rest images  There is evidence of transient  ischemic dilatation with an increased TID of 1.33 which can be seen with multivessel CAD  Consider coronary CTA with FFR   ECHOCARDIOGRAM  ECHOCARDIOGRAM COMPLETE 04/28/2022  Narrative ECHOCARDIOGRAM REPORT  IMPRESSIONS   1. Left ventricular ejection fraction, by estimation, is 60 to 65%. The left ventricle has normal function.  The left ventricle has no regional wall motion abnormalities. There is mild left ventricular hypertrophy. Left ventricular diastolic parameters were normal. 2. Right ventricular systolic function is normal. The right ventricular size is normal. There is normal pulmonary artery systolic pressure. The estimated right ventricular systolic pressure is 25.5 mmHg. 3. The mitral valve is normal in structure. Trivial mitral valve regurgitation. No evidence of mitral stenosis. 4. The aortic valve is tricuspid. Aortic valve regurgitation is not visualized. No aortic stenosis is present. 5. The inferior vena cava is normal in size with greater than 50% respiratory variability, suggesting right atrial pressure of 3 mmHg.  FINDINGS Left Ventricle: Left ventricular ejection fraction, by estimation, is 60 to 65%. The left ventricle has normal function. The left ventricle has no regional wall motion abnormalities. The left ventricular internal cavity size was normal in size. There is mild left ventricular hypertrophy. Left ventricular diastolic parameters were normal.  Right Ventricle: The right ventricular size is normal. No increase in right ventricular wall thickness. Right ventricular systolic function is normal. There is normal pulmonary artery systolic pressure. The tricuspid regurgitant velocity is 2.37 m/s, and with an assumed right atrial pressure of 3 mmHg, the estimated right ventricular systolic pressure is 25.5 mmHg.  Left Atrium: Left atrial size was normal in size.  Right Atrium: Right atrial size was normal in size.  Pericardium: There is no evidence of  pericardial effusion.  Mitral Valve: The mitral valve is normal in structure. Trivial mitral valve regurgitation. No evidence of mitral valve stenosis. MV peak gradient, 5.6 mmHg. The mean mitral valve gradient is 2.0 mmHg.  Tricuspid Valve: The tricuspid valve is normal in structure. Tricuspid valve regurgitation is trivial. No evidence of tricuspid stenosis.  Aortic Valve: The aortic valve is tricuspid. Aortic valve regurgitation is not visualized. No aortic stenosis is present. Aortic valve mean gradient measures 6.0 mmHg. Aortic valve peak gradient measures 13.4 mmHg. Aortic valve area, by VTI measures 2.59 cm.  Pulmonic Valve: The pulmonic valve was normal in structure. Pulmonic valve regurgitation is trivial. No evidence of pulmonic stenosis.  Aorta: The aortic root is normal in size and structure.  Venous: The inferior vena cava is normal in size with greater than 50% respiratory variability, suggesting right atrial pressure of 3 mmHg.  IAS/Shunts: No atrial level shunt detected by color flow Doppler.    MONITORS  LONG TERM MONITOR (3-14 DAYS) 07/29/2020  Narrative  Normal sinus rhythm with rare PACs and PVCs.  Intermittent atrial fibrillation with occasional rapid ventricular response, overall < 1% burden.  Several short episodes of AFib noted.   Patch Wear Time:  12 days and 14 hours (2022-05-28T20:13:38-0400 to 2022-06-10T10:23:04-398)  Patient had a min HR of 65 bpm, max HR of 203 bpm, and avg HR of 100 bpm. Predominant underlying rhythm was Sinus Rhythm. 2 Supraventricular Tachycardia runs occurred, the run with the fastest interval lasting 4 beats with a max rate of 203 bpm, the longest lasting 5 beats with an avg rate of 131 bpm. Atrial Fibrillation occurred (<1% burden), ranging from 103-195 bpm (avg of 144 bpm), the longest lasting 49 mins 13 secs with an avg rate of 144 bpm. Isolated SVEs were rare (<1.0%), SVE Couplets were rare (<1.0%), and SVE Triplets were rare  (<1.0%). No Isolated VEs, VE Couplets, or VE Triplets were present.   CT SCANS  CT CORONARY MORPH W/CTA COR W/SCORE 06/17/2017  Addendum 06/17/2017  8:09 AM ADDENDUM REPORT: 06/17/2017 08:07  EXAM: OVER-READ INTERPRETATION  CT CHEST  The following report is an over-read performed by radiologist Dr. Noe Gens [facility] on 06/17/2017. This over-read does not include interpretation of cardiac or coronary anatomy or pathology. The coronary CTA interpretation by the cardiologist is attached.  COMPARISON:  None.  FINDINGS: Heart is normal size. Visualized aorta is normal caliber. No adenopathy in the lower mediastinum or hila.  Minimal dependent atelectasis.  No effusions.  Diffuse fatty infiltration of the liver. Chest wall soft tissues are unremarkable. No acute bony abnormality.  IMPRESSION: Fatty infiltration of the liver.  No acute extra cardiac abnormality.   Electronically Signed By: Charlett Nose M.D. On: 06/17/2017 08:07  Narrative CLINICAL DATA:  57 year old female with atypical chest pain and equivocal stress test  EXAM: Cardiac/Coronary  CT  TECHNIQUE: The patient was scanned on a [employer].  FINDINGS: A 120 kV prospective scan was triggered in the descending thoracic aorta at 111 HU's. Axial non-contrast 3 mm slices were carried out through the heart. The data set was analyzed on a dedicated work station and scored using the Agatson method. Gantry rotation speed was 250 msecs and collimation was .6 mm. No beta blockade and 0.8 mg of sl NTG was given. The 3D data set was reconstructed in 5% intervals of the 67-82 % of the R-R cycle. Diastolic phases were analyzed on a dedicated work station using MPR, MIP and VRT modes. The patient received 80 cc of contrast.  Aorta:  Normal size.  No calcifications.  No dissection.  Aortic Valve:  Trileaflet.  No calcifications.  Coronary Arteries:  Normal coronary origin.  Right  dominance.  RCA is a very large dominant artery that gives rise to PDA and PLVB. There is no plaque.  Left main is a large artery that gives rise to LAD, ramus intermedius and LCX arteries. Left main has no plaque.  LAD is a medium size vessel that has no plaque.  RI is a medium size artery that has no plaque.  LCX is a medium size non-dominant artery that has no plaque.  Other findings:  Normal pulmonary vein drainage into the left atrium.  Normal let atrial appendage without a thrombus.  IMPRESSION: 1. Coronary calcium score of 0. This was 0 percentile for age and sex matched control.  2. Normal coronary origin with right dominance.  3. No evidence of CAD.  4. Mildly dilated pulmonary artery measuring 31 mm suggestive of pulmonary hypertension.  Electronically Signed: By: Tobias Alexander On: 06/16/2017 15:46           Recent Labs: 06/23/2022: TSH 1.070 07/28/2022: Magnesium 1.9 10/16/2022: ALT 24; BUN 10; Creatinine, Ser 0.56; Hemoglobin 13.3; Platelets 192; Potassium 3.1; Sodium 138  Recent Lipid Panel    Component Value Date/Time   CHOL 135 06/23/2022 1021   TRIG 177 (H) 06/23/2022 1021   HDL 40 06/23/2022 1021   CHOLHDL 3.4 06/23/2022 1021   CHOLHDL 3.7 06/16/2017 0417   VLDL 29 06/16/2017 0417   LDLCALC 65 06/23/2022 1021     Risk Assessment/Calculations:    CHA2DS2-VASc Score = 3  {This indicates a 3.2% annual risk of stroke. The patient's score is based upon: CHF History: 0 HTN History: 1 Diabetes History: 1 Stroke History: 0 Vascular Disease History: 0 Age Score: 0 Gender Score: 1    Physical Exam:    VS:  BP 122/62   Pulse 100   Ht 5\' 1"  (1.549 m)   Wt 190 lb 3.2 oz (86.3 kg)   LMP 05/12/2011  SpO2 97%   BMI 35.94 kg/m     Wt Readings from Last 3 Encounters:  10/26/22 190 lb 3.2 oz (86.3 kg)  10/16/22 192 lb (87.1 kg)  09/29/22 192 lb 6.4 oz (87.3 kg)     GEN:  Well nourished, well developed in no acute distress HEENT:  Normal NECK: No JVD; No carotid bruits CARDIAC: RRR, no murmurs, rubs, gallops RESPIRATORY:  Clear to auscultation without rales, wheezing or rhonchi  ABDOMEN: Soft, non-tender, non-distended MUSCULOSKELETAL:  No edema; No deformity. 2+ pedal pulses, equal bilaterally SKIN: Warm and dry NEUROLOGIC:  Alert and oriented x 3 PSYCHIATRIC:  Normal affect   EKG:  EKG is not ordered today.      Diagnoses:    No diagnosis found.  Assessment and Plan:     Frequent PACs: EKG 10/16/22 with frequent PACs. She discontinued metoprolol due to recent episodes of bradycardia. We will try nebivolol 2.5 mg at bedtime.  I have asked her to notify us if she does not tolerate.  PAF on chronic anticoagulation: Clinically appears to be in sinus rhythm today.  HR is 100 bpm today and has increased since stopping metoprolol. We will trial nebivolol 2.5 mg at bedtime to see if she tolerates. No bleeding concerns. Continue Eliquis 5 mg twice daily which is appropriate dose for stroke prevention for CHA2DS2-VASc score of 3.   Hypertension: History of profound hypotension 04/2022 requiring admission and subsequent discontinuation of antihypertensives. Since that time, we have tried diltiazem which caused hypotension and metoprolol which caused bradycardia. Unfortunately, her BP has been elevated recently.  We will try low-dose nebivolol as noted above. Encouraged heart healthy, low sodium diet and increased physical activity to help regular HR and BP.   Aortic atherosclerosis/Hyperlipidemia LDL goal < 70: LDL 65 on 06/23/22. Continue rosuvastatin.   Chest pain/Shortness of breath: Chronic history of atypical chest pain. Pain does not worsen with exertion. Coronary CTA with calcium score of 0, no evidence of CAD 06/2017. No increase in severity of pain since CT in 2019, therefore I do not suspect coronary obstruction. Increased shortness of breath with activity recently. Echo 04/28/22 with normal LVEF, no regional wall  motion abnormalities. She denies edema, orthopnea, PND. I suspect SOB is secondary to deconditioning and weight. Encouraged primary prevention of CAD including 150 minutes of moderate intensity exercise each week, heart healthy diet, and weight loss.     Disposition: 6 months with [facility] Fib Clinic/1 year with me  Medication Adjustments/Labs and Tests Ordered: Current medicines are reviewed at length with the patient today.  Concerns regarding medicines are outlined above.  No orders of the defined types were placed in this encounter.  No orders of the defined types were placed in this encounter.   There are no Patient Instructions on file for this visit.   Signed, Levi Aland, NP  10/26/2022 3:07 PM    [facility]

## 2022-10-14 ENCOUNTER — Ambulatory Visit: Payer: Federal, State, Local not specified - PPO | Admitting: Nurse Practitioner

## 2022-10-16 ENCOUNTER — Encounter (HOSPITAL_BASED_OUTPATIENT_CLINIC_OR_DEPARTMENT_OTHER): Payer: Self-pay

## 2022-10-16 ENCOUNTER — Emergency Department (HOSPITAL_BASED_OUTPATIENT_CLINIC_OR_DEPARTMENT_OTHER): Payer: Federal, State, Local not specified - PPO

## 2022-10-16 ENCOUNTER — Other Ambulatory Visit: Payer: Self-pay

## 2022-10-16 ENCOUNTER — Emergency Department (HOSPITAL_BASED_OUTPATIENT_CLINIC_OR_DEPARTMENT_OTHER)
Admission: EM | Admit: 2022-10-16 | Discharge: 2022-10-16 | Disposition: A | Discharge status: Home / Self Care | Payer: Federal, State, Local not specified - PPO | Attending: Emergency Medicine | Admitting: Emergency Medicine

## 2022-10-16 DIAGNOSIS — Z79899 Other long term (current) drug therapy: Secondary | ICD-10-CM | POA: Insufficient documentation

## 2022-10-16 DIAGNOSIS — E119 Type 2 diabetes mellitus without complications: Secondary | ICD-10-CM | POA: Diagnosis not present

## 2022-10-16 DIAGNOSIS — Z7901 Long term (current) use of anticoagulants: Secondary | ICD-10-CM | POA: Insufficient documentation

## 2022-10-16 DIAGNOSIS — I10 Essential (primary) hypertension: Secondary | ICD-10-CM | POA: Insufficient documentation

## 2022-10-16 DIAGNOSIS — R531 Weakness: Secondary | ICD-10-CM | POA: Diagnosis not present

## 2022-10-16 DIAGNOSIS — R0789 Other chest pain: Secondary | ICD-10-CM | POA: Diagnosis not present

## 2022-10-16 DIAGNOSIS — Z8543 Personal history of malignant neoplasm of ovary: Secondary | ICD-10-CM | POA: Diagnosis not present

## 2022-10-16 LAB — COMPREHENSIVE METABOLIC PANEL
ALT: 24 U/L (ref 0–44)
AST: 20 U/L (ref 15–41)
Albumin: 3.9 g/dL (ref 3.5–5.0)
Alkaline Phosphatase: 92 U/L (ref 38–126)
Anion gap: 12 (ref 5–15)
BUN: 10 mg/dL (ref 6–20)
CO2: 25 mmol/L (ref 22–32)
Calcium: 10.1 mg/dL (ref 8.9–10.3)
Chloride: 101 mmol/L (ref 98–111)
Creatinine, Ser: 0.56 mg/dL (ref 0.44–1.00)
GFR, Estimated: >60 mL/min (ref >60)
Glucose, Bld: 287 mg/dL — ABNORMAL HIGH (ref 70–99)
Potassium: 3.1 mmol/L — ABNORMAL LOW (ref 3.5–5.1)
Sodium: 138 mmol/L (ref 135–145)
Total Bilirubin: 0.6 mg/dL (ref 0.3–1.2)
Total Protein: 7 g/dL (ref 6.5–8.1)

## 2022-10-16 LAB — URINALYSIS, ROUTINE W REFLEX MICROSCOPIC
Bilirubin Urine: NEGATIVE
Glucose, UA: >1000 mg/dL — AB
Hgb urine dipstick: NEGATIVE
Ketones, ur: NEGATIVE mg/dL
Leukocytes,Ua: NEGATIVE
Nitrite: NEGATIVE
Specific Gravity, Urine: 1.036 — ABNORMAL HIGH (ref 1.005–1.030)
pH: 5.5 (ref 5.0–8.0)

## 2022-10-16 LAB — CBC WITH DIFFERENTIAL/PLATELET
Abs Immature Granulocytes: 0.05 10*3/uL (ref 0.00–0.07)
Basophils Absolute: 0.1 10*3/uL (ref 0.0–0.1)
Basophils Relative: 1 %
Eosinophils Absolute: 0.2 10*3/uL (ref 0.0–0.5)
Eosinophils Relative: 3 %
HCT: 39.9 % (ref 36.0–46.0)
Hemoglobin: 13.3 g/dL (ref 12.0–15.0)
Immature Granulocytes: 1 %
Lymphocytes Relative: 32 %
Lymphs Abs: 2.1 10*3/uL (ref 0.7–4.0)
MCH: 26.5 pg (ref 26.0–34.0)
MCHC: 33.3 g/dL (ref 30.0–36.0)
MCV: 79.5 fL — ABNORMAL LOW (ref 80.0–100.0)
Monocytes Absolute: 0.3 10*3/uL (ref 0.1–1.0)
Monocytes Relative: 5 %
Neutro Abs: 4 10*3/uL (ref 1.7–7.7)
Neutrophils Relative %: 58 %
Platelets: 192 10*3/uL (ref 150–400)
RBC: 5.02 MIL/uL (ref 3.87–5.11)
RDW: 14.2 % (ref 11.5–15.5)
WBC: 6.7 10*3/uL (ref 4.0–10.5)
nRBC: 0 % (ref 0.0–0.2)

## 2022-10-16 LAB — D-DIMER, QUANTITATIVE: D-Dimer, Quant: <0.27 ug{FEU}/mL (ref 0.00–0.50)

## 2022-10-16 LAB — TROPONIN I (HIGH SENSITIVITY)
Troponin I (High Sensitivity): 5 ng/L (ref <18)
Troponin I (High Sensitivity): 4 ng/L (ref <18)

## 2022-10-16 MED ORDER — SODIUM CHLORIDE 0.9 % IV BOLUS
1000.0000 mL | Freq: Once | INTRAVENOUS | Status: AC
  Administered 2022-10-16: 1000 mL via INTRAVENOUS

## 2022-10-16 MED ORDER — PROCHLORPERAZINE EDISYLATE 10 MG/2ML IJ SOLN
10.0000 mg | Freq: Once | INTRAMUSCULAR | Status: AC
  Administered 2022-10-16: 10 mg via INTRAVENOUS
  Filled 2022-10-16: qty 2

## 2022-10-16 MED ORDER — DIPHENHYDRAMINE HCL 50 MG/ML IJ SOLN
12.5000 mg | Freq: Once | INTRAMUSCULAR | Status: AC
  Administered 2022-10-16: 12.5 mg via INTRAVENOUS
  Filled 2022-10-16: qty 1

## 2022-10-16 MED ORDER — POTASSIUM CHLORIDE CRYS ER 20 MEQ PO TBCR
40.0000 meq | EXTENDED_RELEASE_TABLET | Freq: Once | ORAL | Status: AC
  Administered 2022-10-16: 40 meq via ORAL
  Filled 2022-10-16: qty 2

## 2022-10-16 MED ORDER — ACETAMINOPHEN 325 MG PO TABS
650.0000 mg | ORAL_TABLET | Freq: Once | ORAL | Status: AC
  Administered 2022-10-16: 650 mg via ORAL
  Filled 2022-10-16: qty 2

## 2022-10-16 NOTE — ED Provider Notes (Signed)
EMERGENCY DEPARTMENT AT [facility] Provider Note   CSN: [id number] Arrival date & time: 10/16/22  1827     History  Chief Complaint  Patient presents with   Hypertension   Chest Pain    Morgan [name] is a 57 y.o. female.  Patient here with generalized weakness, high blood pressure, chest pain.  She has not felt well since COVID about 2 weeks ago.  Has not been eating and drinking well.  Denies any abdominal pain.  She has been having some headaches.  No shortness of breath.  History of ovarian cancer in remission.  She is on Eliquis for A-fib.  History of endometriosis, anxiety, diabetes.  She has been having some chest discomfort but denies any sputum production.  No cough.  Feeling a little bit better but overall just feels generally weak.  The history is provided by the patient.       Home Medications Prior to Admission medications   Medication Sig Start Date End Date Taking? Authorizing Provider  acetaminophen (TYLENOL) 500 MG tablet Take 500 mg by mouth every 6 (six) hours as needed for mild pain.    [provider]  apixaban (ELIQUIS) 5 MG TABS tablet Take 1 tablet (5 mg total) by mouth 2 (two) times daily. 03/19/22   Fenton, Clint R, PA  ARIPiprazole (ABILIFY) 15 MG tablet Take 15 mg by mouth at bedtime. 12/21/17   Ellis Savage, NP  clotrimazole (LOTRIMIN) 1 % cream Apply to affected area 2 times daily 08/19/22   Gustavus Bryant, FNP  cyclobenzaprine (FLEXERIL) 5 MG tablet Take 1 tablet (5 mg total) by mouth 2 (two) times daily as needed for muscle spasms. 09/07/22   Small, Brooke L, PA  Dapagliflozin Pro-metFORMIN ER (XIGDUO XR) 11-998 MG TB24 Take 1 tablet by mouth at bedtime. 06/23/22   [name], PA  DULoxetine (CYMBALTA) 60 MG capsule Take 60 mg by mouth at bedtime. 11/04/16   [provider]  fluticasone (FLONASE) 50 MCG/ACT nasal spray Place 1 spray into both nostrils daily as needed for allergies.    [provider]   Insulin Aspart FlexPen (NOVOLOG) 100 UNIT/ML INJECT 10 UNITS INTO THE SKIN BEFORE BREAKFAST AND LUNCH. INJECT 13 UNITS INTO THE SKIN BEFORE SUPPER. 08/09/22   Saralyn Pilar A, PA  lidocaine (LIDODERM) 5 % Place 1 patch onto the skin daily as needed (pain).    [provider]  metoprolol tartrate (LOPRESSOR) 25 MG tablet Take 0.5 tablets (12.5 mg total) by mouth 2 (two) times daily. 05/20/22   Swinyer, Zachary George, NP  omeprazole (PRILOSEC OTC) 20 MG tablet Take 20 mg by mouth at bedtime.    [provider]  ondansetron (ZOFRAN-ODT) 4 MG disintegrating tablet Take 1 tablet (4 mg total) by mouth every 8 (eight) hours as needed. 09/30/22   Sandre Kitty, MD  rosuvastatin (CRESTOR) 10 MG tablet Take 1 tablet (10 mg total) by mouth daily. 04/08/22   Corky Crafts, MD  tiZANidine (ZANAFLEX) 4 MG tablet Take 4 mg by mouth at bedtime as needed for muscle spasms. 05/14/21   [provider]      Allergies    Clindamycin/lincomycin, Lamictal [lamotrigine], Banana, Lipitor [atorvastatin], and Pineapple    Review of Systems   Review of Systems  Physical Exam Updated Vital Signs BP (!) 171/65   Pulse (!) 56   Temp 98 F (36.7 C) (Oral)   Resp 18   Ht 5\' 1"  (1.549 m)  Wt 87.1 kg   LMP 05/12/2011   SpO2 93%   BMI 36.28 kg/m  Physical Exam Vitals and nursing note reviewed.  Constitutional:      General: She is not in acute distress.    Appearance: She is well-developed. She is not ill-appearing.  HENT:     Head: Normocephalic and atraumatic.  Eyes:     Extraocular Movements: Extraocular movements intact.     Conjunctiva/sclera: Conjunctivae normal.     Pupils: Pupils are equal, round, and reactive to light.  Cardiovascular:     Rate and Rhythm: Regular rhythm.     Pulses:          Radial pulses are 2+ on the right side and 2+ on the left side.     Heart sounds: Normal heart sounds. No murmur heard. Pulmonary:     Effort: Pulmonary effort is normal. No  respiratory distress.     Breath sounds: Normal breath sounds.  Abdominal:     Palpations: Abdomen is soft.     Tenderness: There is no abdominal tenderness.  Musculoskeletal:        General: No swelling. Normal range of motion.     Cervical back: Normal range of motion and neck supple.     Right lower leg: No edema.     Left lower leg: No edema.  Skin:    General: Skin is warm and dry.     Capillary Refill: Capillary refill takes less than 2 seconds.  Neurological:     General: No focal deficit present.     Mental Status: She is alert and oriented to person, place, and time.     Cranial Nerves: No cranial nerve deficit.     Motor: No weakness.     Comments: 5+ out of 5 strength throughout, normal sensation, no drift, normal finger-nose-finger, normal speech  Psychiatric:        Mood and Affect: Mood normal.     ED Results / Procedures / Treatments   Labs (all labs ordered are listed, but only abnormal results are displayed) Labs Reviewed  CBC WITH DIFFERENTIAL/PLATELET - Abnormal; Notable for the following components:      Result Value   MCV 79.5 (*)    All other components within normal limits  COMPREHENSIVE METABOLIC PANEL - Abnormal; Notable for the following components:   Potassium 3.1 (*)    Glucose, Bld 287 (*)    All other components within normal limits  URINALYSIS, ROUTINE W REFLEX MICROSCOPIC - Abnormal; Notable for the following components:   Color, Urine COLORLESS (*)    Specific Gravity, Urine 1.036 (*)    Glucose, UA >1,000 (*)    Protein, ur TRACE (*)    Bacteria, UA RARE (*)    All other components within normal limits  D-DIMER, QUANTITATIVE  TROPONIN I (HIGH SENSITIVITY)  TROPONIN I (HIGH SENSITIVITY)    EKG EKG Interpretation Date/Time:  Saturday October 16 2022 18:35:34 EDT Ventricular Rate:  82 PR Interval:  158 QRS Duration:  81 QT Interval:  348 QTC Calculation: 407 R Axis:   54  Text Interpretation: Sinus tachycardia Atrial premature  complexes Confirmed by Virgina Norfolk [phone]) on 10/16/2022 6:47:21 PM  Radiology CT Head Wo Contrast  Result Date: 10/16/2022 CLINICAL DATA:  Headache, increasing frequency or severity, hypertension EXAM: CT HEAD WITHOUT CONTRAST TECHNIQUE: Contiguous axial images were obtained from the base of the skull through the vertex without intravenous contrast. RADIATION DOSE REDUCTION: This exam was performed according to the  departmental dose-optimization program which includes automated exposure control, adjustment of the mA and/or kV according to patient size and/or use of iterative reconstruction technique. COMPARISON:  09/05/2021 FINDINGS: Brain: No acute intracranial abnormality. Specifically, no hemorrhage, hydrocephalus, mass lesion, acute infarction, or significant intracranial injury. Vascular: No hyperdense vessel or unexpected calcification. Skull: No acute calvarial abnormality. Sinuses/Orbits: No acute findings Other: None IMPRESSION: No acute intracranial abnormality. Electronically Signed   By: Charlett Nose M.D.   On: 10/16/2022 21:05   DG Chest Portable 1 View  Result Date: 10/16/2022 CLINICAL DATA:  Chest pain, high blood pressure. EXAM: PORTABLE CHEST 1 VIEW COMPARISON:  09/07/2022. FINDINGS: The heart size and mediastinal contours are within normal limits. No consolidation, effusion, or pneumothorax. Cervical spinal fusion hardware is noted. Degenerative changes are present in the thoracic spine. No acute osseous abnormality. IMPRESSION: No active disease. Electronically Signed   By: Thornell Sartorius M.D.   On: 10/16/2022 20:25    Procedures Procedures    Medications Ordered in ED Medications  acetaminophen (TYLENOL) tablet 650 mg (650 mg Oral Given 10/16/22 1914)  sodium chloride 0.9 % bolus 1,000 mL (0 mLs Intravenous Stopped 10/16/22 2117)  prochlorperazine (COMPAZINE) injection 10 mg (10 mg Intravenous Given 10/16/22 1906)  diphenhydrAMINE (BENADRYL) injection 12.5 mg (12.5 mg  Intravenous Given 10/16/22 1907)  potassium chloride SA (KLOR-CON M) CR tablet 40 mEq (40 mEq Oral Given 10/16/22 2004)    ED Course/ Medical Decision Making/ A&P                                 Medical Decision Making Amount and/or Complexity of Data Reviewed Labs: ordered. Radiology: ordered.  Risk OTC drugs. Prescription drug management.   Ciela R Jansson is here with generalized weakness, chest pain, high blood pressure.  Blood pressure upon arrival 191/72 but then decreased to 174/65.  Heart rate appears to be in the 80s.  Sinus rhythm.  History of A-fib on Eliquis.  Has not felt well since having COVID about 2 weeks ago.  Generalized weakness.  Some chest pain.  No shortness of breath.  Fevers resolved now for a few days.  She is got mild headache.  Denies any weakness numbness tingling vision loss.  Differential diagnosis likely postviral syndrome seems less likely to be pneumonia or ACS or PE or stroke or head bleed.  However will evaluate with CBC, CMP, troponin, D-dimer, chest x-ray.  Will give IV fluids, headache cocktail.  Will get head CT.  Per my review and interpretation labs no significant anemia or leukocytosis.  Troponin negative x 2.  D-dimer negative.  Have no concern for ACS or PE.  EKG shows sinus rhythm.  No ischemic changes.  I reviewed interpreted EKG.  Chest x-ray no evidence of pneumonia or pneumothorax.  Head CT with no acute process.  Feeling much better with IV fluids and headache cocktail.  Potassium was 3.1 but otherwise no significant electrolyte abnormality or AKI.  Was given a dose of potassium in the ED.  Overall blood pressures actually improved well with fluids and pain management.  I suspect her symptoms are secondary to resolving virus/COVID infection.  Have no concern for stroke or other emergent process at this time.  I recommend follow-up with primary care doctor.  Patient discharged in good condition.  This chart was dictated using voice recognition  software.  Despite best efforts to proofread,  errors can occur which can change the  documentation meaning.         Final Clinical Impression(s) / ED Diagnoses Final diagnoses:  Atypical chest pain  Weakness    Rx / DC Orders ED Discharge Orders     None         Virgina Norfolk, DO 10/16/22 2118

## 2022-10-16 NOTE — Discharge Instructions (Signed)
Follow-up with your cardiologist to further discuss blood pressure, heart rate control.  Follow-up with your primary care doctor.  Return if symptoms worsen.

## 2022-10-16 NOTE — ED Triage Notes (Addendum)
Pt to ED c/o high blood pressure. Reports hx of the same. Reports recent medication change. Pt also reports chest pain x 3 hours , constant in nature. Denies aggravating or alleviating factors.  Reports tested positive for COVID 2 weeks ago

## 2022-10-16 NOTE — ED Notes (Signed)
Transported to CT 

## 2022-10-25 ENCOUNTER — Ambulatory Visit: Payer: Federal, State, Local not specified - PPO | Admitting: Family Medicine

## 2022-10-26 ENCOUNTER — Encounter: Payer: Self-pay | Admitting: Nurse Practitioner

## 2022-10-26 ENCOUNTER — Ambulatory Visit: Payer: Federal, State, Local not specified - PPO | Attending: Nurse Practitioner | Admitting: Nurse Practitioner

## 2022-10-26 VITALS — BP 122/62 | HR 100 | Ht 61.0 in | Wt 190.2 lb

## 2022-10-26 DIAGNOSIS — I480 Paroxysmal atrial fibrillation: Secondary | ICD-10-CM

## 2022-10-26 DIAGNOSIS — D6869 Other thrombophilia: Secondary | ICD-10-CM | POA: Diagnosis not present

## 2022-10-26 DIAGNOSIS — E785 Hyperlipidemia, unspecified: Secondary | ICD-10-CM | POA: Diagnosis not present

## 2022-10-26 DIAGNOSIS — I10 Essential (primary) hypertension: Secondary | ICD-10-CM

## 2022-10-26 DIAGNOSIS — I700 Atherosclerosis of aorta: Secondary | ICD-10-CM | POA: Diagnosis not present

## 2022-10-26 DIAGNOSIS — R072 Precordial pain: Secondary | ICD-10-CM

## 2022-10-26 MED ORDER — NEBIVOLOL HCL 2.5 MG PO TABS: 2.5000 mg | ORAL_TABLET | Freq: Every day | ORAL | 3 refills | Status: DC

## 2022-10-26 NOTE — Patient Instructions (Signed)
Medication Instructions:   START Nebivolol one (1) tablet by mouth ( 2.5 mg) at bedtime.   *If you need a refill on your cardiac medications before your next appointment, please call your pharmacy*   Lab Work:  None ordered.  If you have labs (blood work) drawn today and your tests are completely normal, you will receive your results only by: MyChart Message (if you have MyChart) OR A paper copy in the mail If you have any lab test that is abnormal or we need to change your treatment, we will call you to review the results.   Testing/Procedures:  None ordered.   Follow-Up: At [facility], you and your health needs are our priority.  As part of our continuing mission to provide you with exceptional heart care, we have created designated Provider Care Teams.  These Care Teams include your primary Cardiologist (physician) and Advanced Practice Providers (APPs -  Physician Assistants and Nurse Practitioners) who all work together to provide you with the care you need, when you need it.  We recommend signing up for the patient portal called "MyChart".  Sign up information is provided on this After Visit Summary.  MyChart is used to connect with patients for Virtual Visits (Telemedicine).  Patients are able to view lab/test results, encounter notes, upcoming appointments, etc.  Non-urgent messages can be sent to your provider as well.   To learn more about what you can do with MyChart, go to [email or web address].    Your next appointment:   1 year(s)  Provider:   Eligha Bridegroom, NP         Other Instructions  Your physician wants you to follow-up in: 1 year.  You will receive a reminder letter in the mail two months in advance. If you don't receive a letter, please call our office to schedule the follow-up appointment.  [facility] INFORMATION:  The [facility] Clinic is located on the [facility] at [address] Monday, September 23 @ 10:00 am.  Patients  should enter through Entrance "A" of [facility] and go to [employer] (immediately to the left of the entrance) to the 6th floor. Once you exit the elevator on the 6th floor check in with the [facility] Registration Desk to the right of elevator. Valet parking is available at the entrance. Phone number: [phone]       Mediterranean Diet  Why follow it? Research shows. Those who follow the Mediterranean diet have a reduced risk of heart disease  The diet is associated with a reduced incidence of Parkinson's and Alzheimer's diseases People following the diet may have longer life expectancies and lower rates of chronic diseases  The Dietary Guidelines for Americans recommends the Mediterranean diet as an eating plan to promote health and prevent disease  What Is the Mediterranean Diet?  Healthy eating plan based on typical foods and recipes of Mediterranean-style cooking The diet is primarily a plant based diet; these foods should make up a majority of meals   Starches - Plant based foods should make up a majority of meals - They are an important sources of vitamins, minerals, energy, antioxidants, and fiber - Choose whole grains, foods high in fiber and minimally processed items  - Typical grain sources include wheat, oats, barley, corn, brown rice, bulgar, farro, millet, polenta, couscous  - Various types of beans include chickpeas, lentils, fava beans, black beans, white beans   Fruits  Veggies - Large quantities of antioxidant rich  fruits & veggies; 6 or more servings  - Vegetables can be eaten raw or lightly drizzled with oil and cooked  - Vegetables common to the traditional Mediterranean Diet include: artichokes, arugula, beets, broccoli, brussel sprouts, cabbage, carrots, celery, collard greens, cucumbers, eggplant, kale, leeks, lemons, lettuce, mushrooms, okra, onions, peas, peppers, potatoes, pumpkin, radishes, rutabaga, shallots, spinach, sweet potatoes, turnips,  zucchini - Fruits common to the Mediterranean Diet include: apples, apricots, avocados, cherries, clementines, dates, figs, grapefruits, grapes, melons, nectarines, oranges, peaches, pears, pomegranates, strawberries, tangerines  Fats - Replace butter and margarine with healthy oils, such as olive oil, canola oil, and tahini  - Limit nuts to no more than a handful a day  - Nuts include walnuts, almonds, pecans, pistachios, pine nuts  - Limit or avoid candied, honey roasted or heavily salted nuts - Olives are central to the [employer] - can be eaten whole or used in a variety of dishes   Meats Protein - Limiting red meat: no more than a few times a month - When eating red meat: choose lean cuts and keep the portion to the size of deck of cards - Eggs: approx. 0 to 4 times a week  - Fish and lean poultry: at least 2 a week  - Healthy protein sources include, chicken, [place], lean beef, lamb - Increase intake of seafood such as tuna, salmon, trout, mackerel, shrimp, scallops - Avoid or limit high fat processed meats such as sausage and bacon  Dairy - Include moderate amounts of low fat dairy products  - Focus on healthy dairy such as fat free yogurt, skim milk, low or reduced fat cheese - Limit dairy products higher in fat such as whole or 2% milk, cheese, ice cream  Alcohol - Moderate amounts of red wine is ok  - No more than 5 oz daily for women (all ages) and men older than age 7  - No more than 10 oz of wine daily for men younger than 42  Other - Limit sweets and other desserts  - Use herbs and spices instead of salt to flavor foods  - Herbs and spices common to the traditional Mediterranean Diet include: basil, bay leaves, chives, cloves, cumin, fennel, garlic, lavender, marjoram, mint, oregano, parsley, pepper, rosemary, sage, savory, sumac, tarragon, thyme   It's not just a diet, it's a lifestyle:  The Mediterranean diet includes lifestyle factors typical of those in the region   Foods, drinks and meals are best eaten with others and savored Daily physical activity is important for overall good health This could be strenuous exercise like running and aerobics This could also be more leisurely activities such as walking, housework, yard-work, or taking the stairs Moderation is the key; a balanced and healthy diet accommodates most foods and drinks Consider portion sizes and frequency of consumption of certain foods   Meal Ideas & Options:  Breakfast:  Whole wheat toast or whole wheat English muffins with peanut butter & hard boiled egg Steel cut oats topped with apples & cinnamon and skim milk  Fresh fruit: banana, strawberries, melon, berries, peaches  Smoothies: strawberries, bananas, greek yogurt, peanut butter Low fat greek yogurt with blueberries and granola  Egg white omelet with spinach and mushrooms Breakfast couscous: whole wheat couscous, apricots, skim milk, cranberries  Sandwiches:  Hummus and grilled vegetables (peppers, zucchini, squash) on whole wheat bread   Grilled chicken on whole wheat pita with lettuce, tomatoes, cucumbers or tzatziki  [place] salad on whole wheat bread: tuna salad  made with greek yogurt, olives, red peppers, capers, green onions Garlic rosemary lamb pita: lamb sauted with garlic, rosemary, salt & pepper; add lettuce, cucumber, greek yogurt to pita - flavor with lemon juice and black pepper  Seafood:  Mediterranean grilled salmon, seasoned with garlic, basil, parsley, lemon juice and black pepper Shrimp, lemon, and spinach whole-grain pasta salad made with low fat greek yogurt  Seared scallops with lemon orzo  Seared tuna steaks seasoned salt, pepper, coriander topped with tomato mixture of olives, tomatoes, olive oil, minced garlic, parsley, green onions and cappers  Meats:  Herbed greek chicken salad with kalamata olives, cucumber, feta  Red bell peppers stuffed with spinach, bulgur, lean ground beef (or lentils) & topped with  feta   Kebabs: skewers of chicken, tomatoes, onions, zucchini, squash  [place] burgers: made with red onions, mint, dill, lemon juice, feta cheese topped with roasted red peppers Vegetarian Cucumber salad: cucumbers, artichoke hearts, celery, red onion, feta cheese, tossed in olive oil & lemon juice  Hummus and whole grain pita points with a greek salad (lettuce, tomato, feta, olives, cucumbers, red onion) Lentil soup with celery, carrots made with vegetable broth, garlic, salt and pepper  Tabouli salad: parsley, bulgur, mint, scallions, cucumbers, tomato, radishes, lemon juice, olive oil, salt and pepper. Adopting a Healthy Lifestyle.   Weight: Know what a healthy weight is for you (roughly BMI <25) and aim to maintain this. You can calculate your body mass index on your smart phone. Unfortunately, this is not the most accurate measure of healthy weight, but it is the simplest measurement to use. A more accurate measurement involves body scanning which measures lean muscle, fat tissue and bony density. We do not have this equipment at [facility].    Diet: Aim for 7+ servings of fruits and vegetables daily Limit animal fats in diet for cholesterol and heart health - choose grass fed whenever available Avoid highly processed foods (fast food burgers, tacos, fried chicken, pizza, hot dogs, french fries)  Saturated fat comes in the form of butter, lard, coconut oil, margarine, partially hydrogenated oils, and fat in meat. These increase your risk of cardiovascular disease.  Use healthy plant oils, such as olive, canola, soy, corn, sunflower and peanut.  Whole foods such as fruits, vegetables and whole grains have fiber  Men need > 38 grams of fiber per day Women need > 25 grams of fiber per day  Load up on vegetables and fruits - one-half of your plate: Aim for color and variety, and remember that potatoes dont count. Go for whole grains - one-quarter of your plate: Whole wheat, barley, wheat  berries, quinoa, oats, brown rice, and foods made with them. If you want pasta, go with whole wheat pasta. Protein power - one-quarter of your plate: Fish, chicken, beans, and nuts are all healthy, versatile protein sources. Limit red meat. You need carbohydrates for energy! The type of carbohydrate is more important than the amount. Choose carbohydrates such as vegetables, fruits, whole grains, beans, and nuts in the place of white rice, white pasta, potatoes (baked or fried), macaroni and cheese, cakes, cookies, and donuts.  If youre thirsty, drink water. Coffee and tea are good in moderation, but skip sugary drinks and limit milk and dairy products to one or two daily servings. Keep sugar intake at 6 teaspoons or 24 grams or LESS       Exercise: Aim for 150 min of moderate intensity exercise weekly for heart health, and weights twice weekly for bone  health Stay active - any steps are better than no steps! Aim for 7-9 hours of sleep daily      DASH Eating Plan DASH stands for Dietary Approaches to Stop Hypertension. The DASH eating plan is a healthy eating plan that has been shown to: Lower high blood pressure (hypertension). Reduce your risk for type 2 diabetes, heart disease, and stroke. Help with weight loss. What are tips for following this plan? Reading food labels Check food labels for the amount of salt (sodium) per serving. Choose foods with less than 5 percent of the Daily Value (DV) of sodium. In general, foods with less than 300 milligrams (mg) of sodium per serving fit into this eating plan. To find whole grains, look for the word "whole" as the first word in the ingredient list. Shopping Buy products labeled as "low-sodium" or "no salt added." Buy fresh foods. Avoid canned foods and pre-made or frozen meals. Cooking Try not to add salt when you cook. Use salt-free seasonings or herbs instead of table salt or sea salt. Check with your health care provider or pharmacist  before using salt substitutes. Do not fry foods. Cook foods in healthy ways, such as baking, boiling, grilling, roasting, or broiling. Cook using oils that are good for your heart. These include olive, canola, avocado, soybean, and sunflower oil. Meal planning  Eat a balanced diet. This should include: 4 or more servings of fruits and 4 or more servings of vegetables each day. Try to fill half of your plate with fruits and vegetables. 6-8 servings of whole grains each day. 6 or less servings of lean meat, poultry, or fish each day. 1 oz is 1 serving. A 3 oz (85 g) serving of meat is about the same size as the palm of your hand. One egg is 1 oz (28 g). 2-3 servings of low-fat dairy each day. One serving is 1 cup (237 mL). 1 serving of nuts, seeds, or beans 5 times each week. 2-3 servings of heart-healthy fats. Healthy fats called omega-3 fatty acids are found in foods such as walnuts, flaxseeds, fortified milks, and eggs. These fats are also found in cold-water fish, such as sardines, salmon, and mackerel. Limit how much you eat of: Canned or prepackaged foods. Food that is high in trans fat, such as fried foods. Food that is high in saturated fat, such as fatty meat. Desserts and other sweets, sugary drinks, and other foods with added sugar. Full-fat dairy products. Do not salt foods before eating. Do not eat more than 4 egg yolks a week. Try to eat at least 2 vegetarian meals a week. Eat more home-cooked food and less restaurant, buffet, and fast food. Lifestyle When eating at a restaurant, ask if your food can be made with less salt or no salt. If you drink alcohol: Limit how much you have to: 0-1 drink a day if you are female. 0-2 drinks a day if you are female. Know how much alcohol is in your drink. In the [place] one drink is one 12 oz bottle of beer (355 mL), one 5 oz glass of wine (148 mL), or one 1 oz glass of hard liquor (44 mL). General information Avoid eating more than 2,300  mg of salt a day. If you have hypertension, you may need to reduce your sodium intake to 1,500 mg a day. Work with your provider to stay at a healthy body weight or lose weight. Ask what the best weight range is for you. On most  days of the week, get at least 30 minutes of exercise that causes your heart to beat faster. This may include walking, swimming, or biking. Work with your provider or dietitian to adjust your eating plan to meet your specific calorie needs. What foods should I eat? Fruits All fresh, dried, or frozen fruit. Canned fruits that are in their natural juice and do not have sugar added to them. Vegetables Fresh or frozen vegetables that are raw, steamed, roasted, or grilled. Low-sodium or reduced-sodium tomato and vegetable juice. Low-sodium or reduced-sodium tomato sauce and tomato paste. Low-sodium or reduced-sodium canned vegetables. Grains Whole-grain or whole-wheat bread. Whole-grain or whole-wheat pasta. Brown rice. Orpah Cobb. Bulgur. Whole-grain and low-sodium cereals. Pita bread. Low-fat, low-sodium crackers. Whole-wheat flour tortillas. Meats and other proteins Skinless chicken or [place]. Ground chicken or [place]. Pork with fat trimmed off. Fish and seafood. Egg whites. Dried beans, peas, or lentils. Unsalted nuts, nut butters, and seeds. Unsalted canned beans. Lean cuts of beef with fat trimmed off. Low-sodium, lean precooked or cured meat, such as sausages or meat loaves. Dairy Low-fat (1%) or fat-free (skim) milk. Reduced-fat, low-fat, or fat-free cheeses. Nonfat, low-sodium ricotta or cottage cheese. Low-fat or nonfat yogurt. Low-fat, low-sodium cheese. Fats and oils Soft margarine without trans fats. Vegetable oil. Reduced-fat, low-fat, or light mayonnaise and salad dressings (reduced-sodium). Canola, safflower, olive, avocado, soybean, and sunflower oils. Avocado. Seasonings and condiments Herbs. Spices. Seasoning mixes without salt. Other foods Unsalted  popcorn and pretzels. Fat-free sweets. The items listed above may not be all the foods and drinks you can have. Talk to a dietitian to learn more. What foods should I avoid? Fruits Canned fruit in a light or heavy syrup. Fried fruit. Fruit in cream or butter sauce. Vegetables Creamed or fried vegetables. Vegetables in a cheese sauce. Regular canned vegetables that are not marked as low-sodium or reduced-sodium. Regular canned tomato sauce and paste that are not marked as low-sodium or reduced-sodium. Regular tomato and vegetable juices that are not marked as low-sodium or reduced-sodium. Rosita Fire. Olives. Grains Baked goods made with fat, such as croissants, muffins, or some breads. Dry pasta or rice meal packs. Meats and other proteins Fatty cuts of meat. Ribs. Fried meat. Tomasa Blase. Bologna, salami, and other precooked or cured meats, such as sausages or meat loaves, that are not lean and low in sodium. Fat from the back of a pig (fatback). Bratwurst. Salted nuts and seeds. Canned beans with added salt. Canned or smoked fish. Whole eggs or egg yolks. Chicken or [place] with skin. Dairy Whole or 2% milk, cream, and half-and-half. Whole or full-fat cream cheese. Whole-fat or sweetened yogurt. Full-fat cheese. Nondairy creamers. Whipped toppings. Processed cheese and cheese spreads. Fats and oils Butter. Stick margarine. Lard. Shortening. Ghee. Bacon fat. Tropical oils, such as coconut, palm kernel, or palm oil. Seasonings and condiments Onion salt, garlic salt, seasoned salt, table salt, and sea salt. Worcestershire sauce. Tartar sauce. Barbecue sauce. Teriyaki sauce. Soy sauce, including reduced-sodium soy sauce. Steak sauce. Canned and packaged gravies. Fish sauce. Oyster sauce. Cocktail sauce. Store-bought horseradish. Ketchup. Mustard. Meat flavorings and tenderizers. Bouillon cubes. Hot sauces. Pre-made or packaged marinades. Pre-made or packaged taco seasonings. Relishes. Regular salad  dressings. Other foods Salted popcorn and pretzels. The items listed above may not be all the foods and drinks you should avoid. Talk to a dietitian to learn more. Where to find more information National Heart, Lung, and Blood Institute (NHLBI): BuffaloDryCleaner.gl American Heart Association (AHA): heart.org Academy of Nutrition and Dietetics:  eatright.org National Kidney Foundation (NKF): kidney.org This information is not intended to replace advice given to you by your health care provider. Make sure you discuss any questions you have with your health care provider. Document Revised: 02/18/2022 Document Reviewed: 02/18/2022 Elsevier Patient Education  2024 [employer].

## 2022-11-01 ENCOUNTER — Other Ambulatory Visit: Payer: Self-pay | Admitting: Interventional Cardiology

## 2022-11-01 DIAGNOSIS — I480 Paroxysmal atrial fibrillation: Secondary | ICD-10-CM

## 2022-11-01 NOTE — Telephone Encounter (Signed)
Prescription refill request for Eliquis received. Indication: afib  Last office visit: Morgan [name] 10/26/2022 Scr: 0.56, 10/16/2022 Age: 57 yo  Weight: 86.3 kg   Refill sent.

## 2022-11-02 ENCOUNTER — Telehealth: Payer: Self-pay

## 2022-11-02 NOTE — Telephone Encounter (Signed)
Xigduo XR requires a prior authorization. Elliot Cousin, Synjardy XR are preferred formularies. Please advise

## 2022-11-02 NOTE — Telephone Encounter (Signed)
I suppose lets go ahead and do the prior Auth since she has been on Xigduo since 2020

## 2022-11-02 NOTE — Telephone Encounter (Signed)
[employer] has approved Xigduo XR from 10/03/22 - 11/02/2023

## 2022-11-08 ENCOUNTER — Ambulatory Visit (HOSPITAL_COMMUNITY): Payer: Federal, State, Local not specified - PPO | Admitting: Physician Assistant

## 2022-11-09 ENCOUNTER — Encounter: Payer: Self-pay | Admitting: Gastroenterology

## 2022-11-09 ENCOUNTER — Ambulatory Visit: Payer: Federal, State, Local not specified - PPO | Admitting: Gastroenterology

## 2022-11-09 VITALS — BP 129/70 | HR 49 | Temp 98.1°F | Ht 61.0 in | Wt 189.2 lb

## 2022-11-09 DIAGNOSIS — K5900 Constipation, unspecified: Secondary | ICD-10-CM | POA: Diagnosis not present

## 2022-11-09 DIAGNOSIS — R1032 Left lower quadrant pain: Secondary | ICD-10-CM

## 2022-11-09 DIAGNOSIS — M7918 Myalgia, other site: Secondary | ICD-10-CM

## 2022-11-09 NOTE — Progress Notes (Signed)
Wyline Mood MD, MRCP(U.K) [address]  [address]  [place], [place] [address]  Main: [phone]  Fax: [phone]   Gastroenterology Consultation  Referring Provider:     [name], PA Primary Care Physician:  [name], PA Primary Gastroenterologist:  Dr. Wyline Mood  Reason for Consultation:   abdominal pain , ischemic colitis        HPI:   Morgan [name] is a 57 y.o. y/o female referred for consultation & management  by [name], PA.    She has been referred for abdominal pain , seen at the ER on 09/07/2022 for abdominal pain of 4 days duration , was associated with diarrhea that was acute.  CT abdomen showed fatty liver and Small metallic focus in the right lower quadrant appears to be along the course of the right side of the colon and was not seen on the study of April 2024. H/o ovarian ca, endometriosis , ischemic colitis in 02/2022  10/16/2022: Hb 13.3 grams , LFT's normal . Urine analysis shows protein in urine   In 04/2022 admitted with hypotension attributed to medication side effects.  In 02/2022: admitted with LLQ pain , rectal bleeding . Had stopped eloquis ( for parox a fibb)CTA A/P showed focal short segment wall thickening with stranding along the proximal descending colon . Blood thinners were restarted.   04/2018: Last colonoscopy : 2 polyps 3, 7 mm resected in cecum. Sessile polyp.   Says in my of this year she had a colonoscopy at [facility] for the abdominal pain and says it was normal.  They did not have a plan for her subsequently.  She complains of left-sided abdominal pain.  Worse when bending worse with movement.  She does attest to having issues with her back.  Denies any change in her bowel movements.  Denies any blood in the stool.  She has a bowel movement 2 or 3 times a week sometimes is hard.  No change in the nature of the pain with a bowel movement.  Denies any use of NSAIDs. Past Medical History:  Diagnosis  Date   Anxiety    Atrial fibrillation (HCC)    Bipolar disorder (HCC)    Breast mass    right, removed and benign   Cancer (HCC) 09/16/2019   ovarian ca no radiation no chemo   Cervical dysplasia    Diabetes mellitus without complication (HCC)    Endometriosis    GERD (gastroesophageal reflux disease)    Hypercholesteremia    Hypertension    Migraine    Ovarian cancer (HCC)    Pelvic kidney    left   Postpartum depression    hx of    Past Surgical History:  Procedure Laterality Date   BACK SURGERY  02/16/2012   BREAST BIOPSY Right 2016   BREAST BIOPSY Right 2020   BREAST EXCISIONAL BIOPSY Right 2021   BREAST EXCISIONAL BIOPSY Right 2016   benign   BREAST LUMPECTOMY WITH RADIOACTIVE SEED LOCALIZATION Right 10/28/2014   Procedure: RIGHT BREAST LUMPECTOMY WITH RADIOACTIVE SEED LOCALIZATION;  Surgeon: Chevis Pretty III, MD;  Location: [facility];  Service: General;  Laterality: Right;   BREAST LUMPECTOMY WITH RADIOACTIVE SEED LOCALIZATION Right 06/18/2019   Procedure: RIGHT BREAST RADIOACTIVE SEED LOCALIZATION LUMPECTOMY;  Surgeon: Griselda Miner, MD;  Location: [facility];  Service: General;  Laterality: Right;   BREAST SURGERY N/A    Phreesia 07/30/2019   CESAREAN  SECTION  02/15/2001   COLONOSCOPY WITH PROPOFOL N/A 05/01/2018   Procedure: COLONOSCOPY WITH PROPOFOL;  Surgeon: Wyline Mood, MD;  Location: [facility] ENDOSCOPY;  Service: Gastroenterology;  Laterality: N/A;   COLPOSCOPY     ESOPHAGOGASTRODUODENOSCOPY (EGD) WITH PROPOFOL N/A 05/01/2018   Procedure: ESOPHAGOGASTRODUODENOSCOPY (EGD) WITH PROPOFOL;  Surgeon: Wyline Mood, MD;  Location: [facility] ENDOSCOPY;  Service: Gastroenterology;  Laterality: N/A;   ESOPHAGOGASTRODUODENOSCOPY (EGD) WITH PROPOFOL N/A 11/13/2018   Procedure: ESOPHAGOGASTRODUODENOSCOPY (EGD) WITH PROPOFOL;  Surgeon: Wyline Mood, MD;  Location: [facility] ENDOSCOPY;  Service: Gastroenterology;  Laterality: N/A;   GYNECOLOGIC CRYOSURGERY      NECK SURGERY  02/15/2010   PELVIC LAPAROSCOPY  02/15/1989   TOTAL ABDOMINAL HYSTERECTOMY N/A    Phreesia 07/30/2019    Prior to Admission medications   Medication Sig Start Date End Date Taking? Authorizing Provider  acetaminophen (TYLENOL) 500 MG tablet Take 500 mg by mouth every 6 (six) hours as needed for mild pain.    [provider]  apixaban (ELIQUIS) 5 MG TABS tablet TAKE 1 TABLET BY MOUTH TWICE A DAY 11/01/22   Corky Crafts, MD  ARIPiprazole (ABILIFY) 15 MG tablet Take 15 mg by mouth at bedtime. 12/21/17   Ellis Savage, NP  clotrimazole (LOTRIMIN) 1 % cream Apply to affected area 2 times daily 08/19/22   Gustavus Bryant, FNP  cyclobenzaprine (FLEXERIL) 5 MG tablet Take 1 tablet (5 mg total) by mouth 2 (two) times daily as needed for muscle spasms. 09/07/22   Small, Brooke L, PA  Dapagliflozin Pro-metFORMIN ER (XIGDUO XR) 11-998 MG TB24 Take 1 tablet by mouth at bedtime. 06/23/22   [name], PA  DULoxetine (CYMBALTA) 60 MG capsule Take 60 mg by mouth at bedtime. 11/04/16   [provider]  fluticasone (FLONASE) 50 MCG/ACT nasal spray Place 1 spray into both nostrils daily as needed for allergies.    [provider]  Insulin Aspart FlexPen (NOVOLOG) 100 UNIT/ML INJECT 10 UNITS INTO THE SKIN BEFORE BREAKFAST AND LUNCH. INJECT 13 UNITS INTO THE SKIN BEFORE SUPPER. 08/09/22   Saralyn Pilar A, PA  lidocaine (LIDODERM) 5 % Place 1 patch onto the skin daily as needed (pain). Patient not taking: Reported on 10/26/2022    [provider]  nebivolol (BYSTOLIC) 2.5 MG tablet Take 1 tablet (2.5 mg total) by mouth at bedtime. 10/26/22   Swinyer, Zachary George, NP  omeprazole (PRILOSEC OTC) 20 MG tablet Take 20 mg by mouth at bedtime.    [provider]  ondansetron (ZOFRAN-ODT) 4 MG disintegrating tablet Take 1 tablet (4 mg total) by mouth every 8 (eight) hours as needed. 09/30/22   Sandre Kitty, MD  oxycodone (OXY-IR) 5 MG capsule Take 5 mg by  mouth every 4 (four) hours as needed for pain.    [provider]  rosuvastatin (CRESTOR) 10 MG tablet Take 1 tablet (10 mg total) by mouth daily. 04/08/22   Corky Crafts, MD  tiZANidine (ZANAFLEX) 4 MG tablet Take 4 mg by mouth at bedtime as needed for muscle spasms. 05/14/21   [provider]    Family History  Problem Relation Age of Onset   Diabetes Mother    Breast cancer Mother 58   Thyroid disease Mother    Depression Mother    Hypertension Father    Heart disease Father    Heart attack Father 73       Two instances, first at age 32   Stroke Father    Alcohol abuse  Father    Throat cancer Father    Diabetes Maternal Grandmother    Depression Maternal Grandmother    Uterine cancer Maternal Grandmother      Social History   Tobacco Use   Smoking status: Never    Passive exposure: Never   Smokeless tobacco: Never   Tobacco comments:    Never smoke 07/01/21  Vaping Use   Vaping status: Never Used  Substance Use Topics   Alcohol use: No   Drug use: No    Allergies as of 11/09/2022 - Review Complete 11/09/2022  Allergen Reaction Noted   Clindamycin/lincomycin Rash 01/24/2019   Lamictal [lamotrigine] Rash 08/29/2014   Banana Diarrhea 04/28/2022   Lipitor [atorvastatin] Other (See Comments) 03/03/2022   Pineapple Diarrhea 04/28/2022    Review of Systems:    All systems reviewed and negative except where noted in HPI.   Physical Exam:  BP (!) 143/82   Pulse 82   Temp 98.1 F (36.7 C) (Oral)   Ht 5\' 1"  (1.549 m)   Wt 189 lb 3.2 oz (85.8 kg)   LMP 05/12/2011   BMI 35.75 kg/m  Patient's last menstrual period was 05/12/2011. Psych:  Alert and cooperative. Normal mood and affect. General:   Alert,  Well-developed, well-nourished, pleasant and cooperative in NAD Head:  Normocephalic and atraumatic. Eyes:  Sclera clear, no icterus.   Conjunctiva pink. Ears:  Normal auditory acuity. She has significant tenderness in the left  thoracolumbar paraspinal muscles with replication of the pain when pressure is applied on the left ribs in the mid scapular, mid axillary lines.  Along the lower ribs. Abdomen:  Normal bowel sounds.  No bruits.  Soft, non-tender and non-distended without masses, hepatosplenomegaly or hernias noted.  No guarding or rebound tenderness.    Neurologic:  Alert and oriented x3;  grossly normal neurologically. Psych:  Alert and cooperative. Normal mood and affect.  Imaging Studies: CT Head Wo Contrast  Result Date: 10/16/2022 CLINICAL DATA:  Headache, increasing frequency or severity, hypertension EXAM: CT HEAD WITHOUT CONTRAST TECHNIQUE: Contiguous axial images were obtained from the base of the skull through the vertex without intravenous contrast. RADIATION DOSE REDUCTION: This exam was performed according to the departmental dose-optimization program which includes automated exposure control, adjustment of the mA and/or kV according to patient size and/or use of iterative reconstruction technique. COMPARISON:  09/05/2021 FINDINGS: Brain: No acute intracranial abnormality. Specifically, no hemorrhage, hydrocephalus, mass lesion, acute infarction, or significant intracranial injury. Vascular: No hyperdense vessel or unexpected calcification. Skull: No acute calvarial abnormality. Sinuses/Orbits: No acute findings Other: None IMPRESSION: No acute intracranial abnormality. Electronically Signed   By: Charlett Nose M.D.   On: 10/16/2022 21:05   DG Chest Portable 1 View  Result Date: 10/16/2022 CLINICAL DATA:  Chest pain, high blood pressure. EXAM: PORTABLE CHEST 1 VIEW COMPARISON:  09/07/2022. FINDINGS: The heart size and mediastinal contours are within normal limits. No consolidation, effusion, or pneumothorax. Cervical spinal fusion hardware is noted. Degenerative changes are present in the thoracic spine. No acute osseous abnormality. IMPRESSION: No active disease. Electronically Signed   By: Thornell Sartorius  M.D.   On: 10/16/2022 20:25    Assessment and Plan:   Morgan [name] is a 57 y.o. y/o female has been referred for abdominal pain.  Based on her history and examination her pain is all over the left lower ribs from the area of her spine all the way to her anterior axillary line.  I explained to her that the  pain is all musculoskeletal she should follow-up with her primary care provider get referred to physical therapy and get further evaluation.  She does however suffer from constipation at baseline and I suggested she commence on MiraLAX daily.  If she were to have any other different symptoms she is welcome to come back and see [place] at the office.  Follow up in as needed  Dr Wyline Mood MD,MRCP(U.K)

## 2022-11-18 ENCOUNTER — Ambulatory Visit (HOSPITAL_COMMUNITY): Payer: Federal, State, Local not specified - PPO | Admitting: Physician Assistant

## 2022-11-22 ENCOUNTER — Ambulatory Visit: Payer: Federal, State, Local not specified - PPO | Admitting: Family Medicine

## 2022-12-06 ENCOUNTER — Encounter (HOSPITAL_COMMUNITY): Payer: Self-pay | Admitting: Emergency Medicine

## 2022-12-06 ENCOUNTER — Emergency Department (HOSPITAL_COMMUNITY)
Admission: EM | Admit: 2022-12-06 | Discharge: 2022-12-06 | Disposition: A | Discharge status: Home / Self Care | Payer: Medicare Other | Attending: Emergency Medicine | Admitting: Emergency Medicine

## 2022-12-06 ENCOUNTER — Emergency Department (HOSPITAL_COMMUNITY): Payer: Medicare Other

## 2022-12-06 ENCOUNTER — Other Ambulatory Visit: Payer: Self-pay

## 2022-12-06 DIAGNOSIS — R002 Palpitations: Secondary | ICD-10-CM | POA: Insufficient documentation

## 2022-12-06 DIAGNOSIS — E119 Type 2 diabetes mellitus without complications: Secondary | ICD-10-CM | POA: Diagnosis not present

## 2022-12-06 DIAGNOSIS — R079 Chest pain, unspecified: Secondary | ICD-10-CM | POA: Diagnosis present

## 2022-12-06 DIAGNOSIS — Z8543 Personal history of malignant neoplasm of ovary: Secondary | ICD-10-CM | POA: Insufficient documentation

## 2022-12-06 DIAGNOSIS — Z7901 Long term (current) use of anticoagulants: Secondary | ICD-10-CM | POA: Insufficient documentation

## 2022-12-06 DIAGNOSIS — I10 Essential (primary) hypertension: Secondary | ICD-10-CM | POA: Insufficient documentation

## 2022-12-06 LAB — BASIC METABOLIC PANEL
Anion gap: 9 (ref 5–15)
BUN: 7 mg/dL (ref 6–20)
CO2: 23 mmol/L (ref 22–32)
Calcium: 9.3 mg/dL (ref 8.9–10.3)
Chloride: 102 mmol/L (ref 98–111)
Creatinine, Ser: 0.66 mg/dL (ref 0.44–1.00)
GFR, Estimated: >60 mL/min (ref >60)
Glucose, Bld: 210 mg/dL — ABNORMAL HIGH (ref 70–99)
Potassium: 3.2 mmol/L — ABNORMAL LOW (ref 3.5–5.1)
Sodium: 134 mmol/L — ABNORMAL LOW (ref 135–145)

## 2022-12-06 LAB — CBC
HCT: 41.4 % (ref 36.0–46.0)
Hemoglobin: 13.3 g/dL (ref 12.0–15.0)
MCH: 26.2 pg (ref 26.0–34.0)
MCHC: 32.1 g/dL (ref 30.0–36.0)
MCV: 81.5 fL (ref 80.0–100.0)
Platelets: 188 10*3/uL (ref 150–400)
RBC: 5.08 MIL/uL (ref 3.87–5.11)
RDW: 14.6 % (ref 11.5–15.5)
WBC: 7.2 10*3/uL (ref 4.0–10.5)
nRBC: 0 % (ref 0.0–0.2)

## 2022-12-06 LAB — MAGNESIUM: Magnesium: 1.8 mg/dL (ref 1.7–2.4)

## 2022-12-06 LAB — TROPONIN I (HIGH SENSITIVITY)
Troponin I (High Sensitivity): 3 ng/L (ref <18)
Troponin I (High Sensitivity): 3 ng/L (ref <18)

## 2022-12-06 MED ORDER — ACETAMINOPHEN 500 MG PO TABS
1000.0000 mg | ORAL_TABLET | Freq: Once | ORAL | Status: AC
  Administered 2022-12-06: 1000 mg via ORAL
  Filled 2022-12-06: qty 2

## 2022-12-06 NOTE — ED Triage Notes (Signed)
Pt arrived via EMS. Pt reports palpitations and chest pain that began around 8 pm tonight. EMS reports pt was in A.fib with rate as high as 170 bpm on their arrival but converted to NSR prior to arriving at hospital. Pt states palpitations have improved some but chest pain remains. Pt reports history of a.fib.

## 2022-12-06 NOTE — Discharge Instructions (Addendum)
You were evaluated in the Emergency Department and after careful evaluation, we did not find any emergent condition requiring admission or further testing in the hospital.  Your exam/testing today is overall reassuring.  Suspect your symptoms were due to a brief episode of A-fib with a rapid heart rate.  Recommend follow-up with your cardiologist to discuss your symptoms.  Please return to the Emergency Department if you experience any worsening of your condition.   Thank you for allowing [place] to be a part of your care.

## 2022-12-06 NOTE — ED Provider Notes (Signed)
[facility]EMERGENCY DEPT [facility] Emergency Department Provider Note MRN:  [id number]  Arrival date & time: 12/06/22     Chief Complaint   Palpitations and Chest Pain   History of Present Illness   Morgan [name] is a 57 y.o. year-old female with a history of hypertension, diabetes, A-fib presenting to the ED with chief complaint of palpitations and chest pain.  Palpitations and chest pain starting at 8 PM, would not go away.  EMS was called.  Heart rate up to 170 with EMS, A-fib with RVR.  Heart rate is better at this time but patient still having left-sided squeezing chest pain.  Review of Systems  A thorough review of systems was obtained and all systems are negative except as noted in the HPI and PMH.   Patient's Health History    Past Medical History:  Diagnosis Date   Anxiety    Atrial fibrillation (HCC)    Bipolar disorder (HCC)    Breast mass    right, removed and benign   Cancer (HCC) 09/16/2019   ovarian ca no radiation no chemo   Cervical dysplasia    Diabetes mellitus without complication (HCC)    Endometriosis    GERD (gastroesophageal reflux disease)    Hypercholesteremia    Hypertension    Migraine    Ovarian cancer (HCC)    Pelvic kidney    left   Postpartum depression    hx of    Past Surgical History:  Procedure Laterality Date   BACK SURGERY  02/16/2012   BREAST BIOPSY Right 2016   BREAST BIOPSY Right 2020   BREAST EXCISIONAL BIOPSY Right 2021   BREAST EXCISIONAL BIOPSY Right 2016   benign   BREAST LUMPECTOMY WITH RADIOACTIVE SEED LOCALIZATION Right 10/28/2014   Procedure: RIGHT BREAST LUMPECTOMY WITH RADIOACTIVE SEED LOCALIZATION;  Surgeon: Chevis Pretty III, MD;  Location: [facility];  Service: General;  Laterality: Right;   BREAST LUMPECTOMY WITH RADIOACTIVE SEED LOCALIZATION Right 06/18/2019   Procedure: RIGHT BREAST RADIOACTIVE SEED LOCALIZATION LUMPECTOMY;  Surgeon: Griselda Miner, MD;  Location: [facility];   Service: General;  Laterality: Right;   BREAST SURGERY N/A    Phreesia 07/30/2019   CESAREAN SECTION  02/15/2001   COLONOSCOPY WITH PROPOFOL N/A 05/01/2018   Procedure: COLONOSCOPY WITH PROPOFOL;  Surgeon: Wyline Mood, MD;  Location: [facility] ENDOSCOPY;  Service: Gastroenterology;  Laterality: N/A;   COLPOSCOPY     ESOPHAGOGASTRODUODENOSCOPY (EGD) WITH PROPOFOL N/A 05/01/2018   Procedure: ESOPHAGOGASTRODUODENOSCOPY (EGD) WITH PROPOFOL;  Surgeon: Wyline Mood, MD;  Location: [facility] ENDOSCOPY;  Service: Gastroenterology;  Laterality: N/A;   ESOPHAGOGASTRODUODENOSCOPY (EGD) WITH PROPOFOL N/A 11/13/2018   Procedure: ESOPHAGOGASTRODUODENOSCOPY (EGD) WITH PROPOFOL;  Surgeon: Wyline Mood, MD;  Location: [facility] ENDOSCOPY;  Service: Gastroenterology;  Laterality: N/A;   GYNECOLOGIC CRYOSURGERY     NECK SURGERY  02/15/2010   PELVIC LAPAROSCOPY  02/15/1989   TOTAL ABDOMINAL HYSTERECTOMY N/A    Phreesia 07/30/2019    Family History  Problem Relation Age of Onset   Diabetes Mother    Breast cancer Mother 73   Thyroid disease Mother    Depression Mother    Hypertension Father    Heart disease Father    Heart attack Father 38       Two instances, first at age 41   Stroke Father    Alcohol abuse Father    Throat cancer Father    Diabetes Maternal Grandmother    Depression Maternal Grandmother    Uterine  cancer Maternal Grandmother     Social History   Socioeconomic History   Marital status: Married    Spouse name: Not on file   Number of children: Not on file   Years of education: Not on file   Highest education level: Not on file  Occupational History   Not on file  Tobacco Use   Smoking status: Never    Passive exposure: Never   Smokeless tobacco: Never   Tobacco comments:    Never smoke 07/01/21  Vaping Use   Vaping status: Never Used  Substance and Sexual Activity   Alcohol use: No   Drug use: No   Sexual activity: Not on file    Comment: vasectomy  Other Topics Concern   Not on  file  Social History Narrative   Lives in [place] with spouse and son.   Disabled.   Social Determinants of Health   Financial Resource Strain: Not on file  Food Insecurity: No Food Insecurity (04/29/2022)   Hunger Vital Sign    Worried About Running Out of Food in the Last Year: Never true    Ran Out of Food in the Last Year: Never true  Transportation Needs: Not on file  Physical Activity: Not on file  Stress: Not on file  Social Connections: Not on file  Intimate Partner Violence: Not At Risk (04/29/2022)   Humiliation, Afraid, Rape, and Kick questionnaire    Fear of Current or Ex-Partner: No    Emotionally Abused: No    Physically Abused: No    Sexually Abused: No     Physical Exam   Vitals:   12/06/22 0315 12/06/22 0320  BP:  (!) 125/58  Pulse: 72 68  Resp: 13 17  Temp:    SpO2: 94% 96%    CONSTITUTIONAL: Well-appearing, NAD NEURO/PSYCH:  Alert and oriented x 3, no focal deficits EYES:  eyes equal and reactive ENT/NECK:  no LAD, no JVD CARDIO: Regular rate, well-perfused, normal S1 and S2 PULM:  CTAB no wheezing or rhonchi GI/GU:  non-distended, non-tender MSK/SPINE:  No gross deformities, no edema SKIN:  no rash, atraumatic   *Additional and/or pertinent findings included in MDM below  Diagnostic and Interventional Summary    EKG Interpretation Date/Time:  Monday December 06 2022 00:31:22 EDT Ventricular Rate:  78 PR Interval:  172 QRS Duration:  82 QT Interval:  354 QTC Calculation: 404 R Axis:   48  Text Interpretation: Sinus rhythm Confirmed by Kennis Carina [id number]) on 12/06/2022 12:36:37 AM       Labs Reviewed  BASIC METABOLIC PANEL - Abnormal; Notable for the following components:      Result Value   Sodium 134 (*)    Potassium 3.2 (*)    Glucose, Bld 210 (*)    All other components within normal limits  CBC  MAGNESIUM  TROPONIN I (HIGH SENSITIVITY)  TROPONIN I (HIGH SENSITIVITY)    DG Chest Port 1 View  Final Result       Medications  acetaminophen (TYLENOL) tablet 1,000 mg (1,000 mg Oral Given 12/06/22 0053)     Procedures  /  Critical Care .1-3 Lead EKG Interpretation  Performed by: Sabas Sous, MD Authorized by: Sabas Sous, MD     Interpretation: abnormal     ECG rate:  130s   Rhythm: atrial fibrillation     Ectopy: PVCs     Conduction: normal   Comments:     I personally interpreted the rhythm strips from  EMS while at bedside.  A-fib with RVR.     ED Course and Medical Decision Making  Initial Impression and Ddx Patient's symptoms may be explained by an episode of A-fib with RVR.  She endorses full compliance with her blood thinner at home, no missed doses.  Currently heart rate in the 80s, sinus rhythm, reassuring vital signs, still having some mild chest discomfort.  ACS is also considered.  Doubt PE.  Past medical/surgical history that increases complexity of ED encounter: History of A-fib, anticoagulated, history of hypertension, diabetes  Interpretation of Diagnostics I personally reviewed the EKG and my interpretation is as follows: Sinus rhythm, no concerning ischemic features  Labs reassuring with no significant blood count or electrolyte disturbance.  Troponin negative x 2  Patient Reassessment and Ultimate Disposition/Management     Patient continues to be in sinus rhythm, normal rates, little to no symptoms.  No indication for further testing or admission appropriate for discharge.  Patient management required discussion with the following services or consulting groups:  None  Complexity of Problems Addressed Acute illness or injury that poses threat of life of bodily function  Additional Data Reviewed and Analyzed Further history obtained from: EMS on arrival  Additional Factors Impacting ED Encounter Risk Consideration of hospitalization  Elmer Sow. Pilar Plate, MD [facility] Emergency Medicine [facility] [email or web address]  Final Clinical  Impressions(s) / ED Diagnoses     ICD-10-CM   1. Palpitations  R00.2     2. Chest pain, unspecified type  R07.9       ED Discharge Orders     None        Discharge Instructions Discussed with and Provided to Patient:     Discharge Instructions      You were evaluated in the Emergency Department and after careful evaluation, we did not find any emergent condition requiring admission or further testing in the hospital.  Your exam/testing today is overall reassuring.  Suspect your symptoms were due to a brief episode of A-fib with a rapid heart rate.  Recommend follow-up with your cardiologist to discuss your symptoms.  Please return to the Emergency Department if you experience any worsening of your condition.   Thank you for allowing [place] to be a part of your care.       Sabas Sous, MD 12/06/22 [phone]

## 2022-12-06 NOTE — ED Notes (Signed)
Pt in NAD at d/c from ED. A&O. Ambulatory. Respirations even & unlabored. Skin warm & dry. Pt verbalized understanding of d/c teaching including follow up care and reasons to return to the ED. No needs or questions expressed at d/c.

## 2022-12-13 ENCOUNTER — Ambulatory Visit
Admission: EM | Admit: 2022-12-13 | Discharge: 2022-12-13 | Disposition: A | Discharge status: Home / Self Care | Payer: Federal, State, Local not specified - PPO | Attending: Internal Medicine | Admitting: Internal Medicine

## 2022-12-13 ENCOUNTER — Other Ambulatory Visit: Payer: Self-pay

## 2022-12-13 ENCOUNTER — Telehealth: Payer: Self-pay | Admitting: *Deleted

## 2022-12-13 DIAGNOSIS — B3731 Acute candidiasis of vulva and vagina: Secondary | ICD-10-CM

## 2022-12-13 LAB — POCT URINALYSIS DIP (MANUAL ENTRY)
Bilirubin, UA: NEGATIVE
Glucose, UA: 500 mg/dL — AB
Ketones, POC UA: NEGATIVE mg/dL
Leukocytes, UA: NEGATIVE
Nitrite, UA: NEGATIVE
Spec Grav, UA: 1.02 (ref 1.010–1.025)
Urobilinogen, UA: 0.2 U/dL
pH, UA: 6 (ref 5.0–8.0)

## 2022-12-13 MED ORDER — NYSTATIN 100000 UNIT/GM EX CREA: TOPICAL_CREAM | CUTANEOUS | 1 refills | Status: DC

## 2022-12-13 MED ORDER — FLUCONAZOLE 150 MG PO TABS: 150.0000 mg | ORAL_TABLET | Freq: Every day | ORAL | 0 refills | Status: DC

## 2022-12-13 NOTE — ED Notes (Signed)
Provider notified of urine dip results

## 2022-12-13 NOTE — ED Provider Notes (Signed)
[facility]    CSN: [id number] Arrival date & time: 12/13/22  1008      History   Chief Complaint No chief complaint on file.   HPI Morgan [name] is a 57 y.o. female.   57 yr old female who presents to urgent care with complaints of vaginal itching and burning along with soreness and light bleeding. She tried to use lotrimin without relief. The pain is all external. Denies urinary symptoms. Has been going on for about 2 weeks. No recent antibiotics but is diabetic with blood sugars running high.  Also relates that she is having some suprapubic pain that has been going on for about 2 weeks as well.  She denies any dysuria, frequency or urgency.     Past Medical History:  Diagnosis Date   Anxiety    Atrial fibrillation (HCC)    Bipolar disorder (HCC)    Breast mass    right, removed and benign   Cancer (HCC) 09/16/2019   ovarian ca no radiation no chemo   Cervical dysplasia    Diabetes mellitus without complication (HCC)    Endometriosis    GERD (gastroesophageal reflux disease)    Hypercholesteremia    Hypertension    Migraine    Ovarian cancer (HCC)    Pelvic kidney    left   Postpartum depression    hx of    Patient Active Problem List   Diagnosis Date Noted   Single pelvic kidney (left) 09/09/2022   Pressure injury of skin 04/29/2022   Shock (HCC) 04/29/2022   Anticoagulated 03/02/2022   Ischemic colitis (HCC) 03/02/2022   Paroxysmal atrial fibrillation (HCC) 12/31/2020   Hypercoagulable state due to paroxysmal atrial fibrillation (HCC) 12/31/2020   Left-shifted Kenlea Woodell blood cells 05/14/2020   History of endometrial hyperplasia 08/12/2019   Fatty liver 08/12/2019   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   Body mass index (BMI) 33.0-33.9, adult 05/11/2019   Cervical dysplasia 04/18/2019   Other chronic pain 03/16/2019   Lumbago with sciatica, right side 03/16/2019   Low back pain 03/16/2019   Type 2 diabetes mellitus with  other specified complication (HCC) 06/14/2017   Hyperlipidemia associated with type 2 diabetes mellitus (HCC) 06/14/2017   Essential hypertension 06/14/2017   Displacement of lumbar intervertebral disc without myelopathy 09/20/2012   Bipolar disorder (HCC)     Past Surgical History:  Procedure Laterality Date   BACK SURGERY  02/16/2012   BREAST BIOPSY Right 2016   BREAST BIOPSY Right 2020   BREAST EXCISIONAL BIOPSY Right 2021   BREAST EXCISIONAL BIOPSY Right 2016   benign   BREAST LUMPECTOMY WITH RADIOACTIVE SEED LOCALIZATION Right 10/28/2014   Procedure: RIGHT BREAST LUMPECTOMY WITH RADIOACTIVE SEED LOCALIZATION;  Surgeon: Chevis Pretty III, MD;  Location: [facility];  Service: General;  Laterality: Right;   BREAST LUMPECTOMY WITH RADIOACTIVE SEED LOCALIZATION Right 06/18/2019   Procedure: RIGHT BREAST RADIOACTIVE SEED LOCALIZATION LUMPECTOMY;  Surgeon: Griselda Miner, MD;  Location: [facility];  Service: General;  Laterality: Right;   BREAST SURGERY N/A    Phreesia 07/30/2019   CESAREAN SECTION  02/15/2001   COLONOSCOPY WITH PROPOFOL N/A 05/01/2018   Procedure: COLONOSCOPY WITH PROPOFOL;  Surgeon: Wyline Mood, MD;  Location: [facility] ENDOSCOPY;  Service: Gastroenterology;  Laterality: N/A;   COLPOSCOPY     ESOPHAGOGASTRODUODENOSCOPY (EGD) WITH PROPOFOL N/A 05/01/2018   Procedure: ESOPHAGOGASTRODUODENOSCOPY (EGD) WITH PROPOFOL;  Surgeon: Wyline Mood, MD;  Location: [facility] ENDOSCOPY;  Service: Gastroenterology;  Laterality: N/A;  ESOPHAGOGASTRODUODENOSCOPY (EGD) WITH PROPOFOL N/A 11/13/2018   Procedure: ESOPHAGOGASTRODUODENOSCOPY (EGD) WITH PROPOFOL;  Surgeon: Wyline Mood, MD;  Location: [facility] ENDOSCOPY;  Service: Gastroenterology;  Laterality: N/A;   GYNECOLOGIC CRYOSURGERY     NECK SURGERY  02/15/2010   PELVIC LAPAROSCOPY  02/15/1989   TOTAL ABDOMINAL HYSTERECTOMY N/A    Phreesia 07/30/2019    OB History     Gravida  1   Para  1   Term  1   Preterm       AB  0   Living  1      SAB      IAB      Ectopic  0   Multiple      Live Births               Home Medications    Prior to Admission medications   Medication Sig Start Date End Date Taking? Authorizing Provider  acetaminophen (TYLENOL) 500 MG tablet Take 500 mg by mouth every 6 (six) hours as needed for mild pain.    [provider]  apixaban (ELIQUIS) 5 MG TABS tablet TAKE 1 TABLET BY MOUTH TWICE A DAY 11/01/22   Corky Crafts, MD  ARIPiprazole (ABILIFY) 15 MG tablet Take 15 mg by mouth at bedtime. 12/21/17   Ellis Savage, NP  clotrimazole (LOTRIMIN) 1 % cream Apply to affected area 2 times daily 08/19/22   Gustavus Bryant, FNP  cyclobenzaprine (FLEXERIL) 5 MG tablet Take 1 tablet (5 mg total) by mouth 2 (two) times daily as needed for muscle spasms. 09/07/22   Small, Brooke L, PA  Dapagliflozin Pro-metFORMIN ER (XIGDUO XR) 11-998 MG TB24 Take 1 tablet by mouth at bedtime. 06/23/22   [name], PA  DULoxetine (CYMBALTA) 60 MG capsule Take 60 mg by mouth at bedtime. 11/04/16   [provider]  fluticasone (FLONASE) 50 MCG/ACT nasal spray Place 1 spray into both nostrils daily as needed for allergies.    [provider]  Insulin Aspart FlexPen (NOVOLOG) 100 UNIT/ML INJECT 10 UNITS INTO THE SKIN BEFORE BREAKFAST AND LUNCH. INJECT 13 UNITS INTO THE SKIN BEFORE SUPPER. 08/09/22   Saralyn Pilar A, PA  lidocaine (LIDODERM) 5 % Place 1 patch onto the skin daily as needed (pain).    [provider]  nebivolol (BYSTOLIC) 2.5 MG tablet Take 1 tablet (2.5 mg total) by mouth at bedtime. 10/26/22   Swinyer, Zachary George, NP  omeprazole (PRILOSEC OTC) 20 MG tablet Take 20 mg by mouth at bedtime.    [provider]  ondansetron (ZOFRAN-ODT) 4 MG disintegrating tablet Take 1 tablet (4 mg total) by mouth every 8 (eight) hours as needed. 09/30/22   Sandre Kitty, MD  oxycodone (OXY-IR) 5 MG capsule Take 5 mg by mouth every 4 (four) hours as  needed for pain.    [provider]  rosuvastatin (CRESTOR) 10 MG tablet Take 1 tablet (10 mg total) by mouth daily. 04/08/22   Corky Crafts, MD  tiZANidine (ZANAFLEX) 4 MG tablet Take 4 mg by mouth at bedtime as needed for muscle spasms. 05/14/21   [provider]    Family History Family History  Problem Relation Age of Onset   Diabetes Mother    Breast cancer Mother 80   Thyroid disease Mother    Depression Mother    Hypertension Father    Heart disease Father    Heart attack Father 40       Two instances, first  at age 56   Stroke Father    Alcohol abuse Father    Throat cancer Father    Diabetes Maternal Grandmother    Depression Maternal Grandmother    Uterine cancer Maternal Grandmother     Social History Social History   Tobacco Use   Smoking status: Never    Passive exposure: Never   Smokeless tobacco: Never   Tobacco comments:    Never smoke 07/01/21  Vaping Use   Vaping status: Never Used  Substance Use Topics   Alcohol use: No   Drug use: No     Allergies   Clindamycin/lincomycin, Lamictal [lamotrigine], Banana, Lipitor [atorvastatin], and Pineapple   Review of Systems Review of Systems  Constitutional:  Negative for chills and fever.  HENT:  Negative for ear pain and sore throat.   Eyes:  Negative for pain and visual disturbance.  Respiratory:  Negative for cough and shortness of breath.   Cardiovascular:  Negative for chest pain and palpitations.  Gastrointestinal:  Negative for abdominal pain and vomiting.       Mild suprapubic abdominal pain  Genitourinary:  Positive for vaginal bleeding and vaginal pain. Negative for difficulty urinating, dysuria, frequency, hematuria and vaginal discharge.  Musculoskeletal:  Negative for arthralgias and back pain.  Skin:  Negative for color change and rash.  Neurological:  Negative for seizures and syncope.  All other systems reviewed and are negative.    Physical Exam Triage  Vital Signs ED Triage Vitals  Encounter Vitals Group     BP      Systolic BP Percentile      Diastolic BP Percentile      Pulse      Resp      Temp      Temp src      SpO2      Weight      Height      Head Circumference      Peak Flow      Pain Score      Pain Loc      Pain Education      Exclude from Growth Chart    No data found.  Updated Vital Signs LMP 05/12/2011   Visual Acuity Right Eye Distance:   Left Eye Distance:   Bilateral Distance:    Right Eye Near:   Left Eye Near:    Bilateral Near:     Physical Exam Vitals and nursing note reviewed.  Constitutional:      General: She is not in acute distress.    Appearance: She is well-developed.  HENT:     Head: Normocephalic and atraumatic.  Eyes:     Conjunctiva/sclera: Conjunctivae normal.  Cardiovascular:     Rate and Rhythm: Normal rate and regular rhythm.     Heart sounds: No murmur heard. Pulmonary:     Effort: Pulmonary effort is normal. No respiratory distress.     Breath sounds: Normal breath sounds.  Abdominal:     General: Bowel sounds are normal. There is no distension.     Palpations: Abdomen is soft.     Tenderness: There is no abdominal tenderness. There is no guarding or rebound.     Hernia: No hernia is present.  Musculoskeletal:        General: No swelling.     Cervical back: Neck supple.  Skin:    General: Skin is warm and dry.     Capillary Refill: Capillary refill takes less than 2 seconds.  Neurological:     Mental Status: She is alert.  Psychiatric:        Mood and Affect: Mood normal.      UC Treatments / Results  Labs (all labs ordered are listed, but only abnormal results are displayed) Labs Reviewed - No data to display  EKG   Radiology No results found.  Procedures Procedures (including critical care time)  Medications Ordered in UC Medications - No data to display  Initial Impression / Assessment and Plan / UC Course  I have reviewed the triage vital  signs and the nursing notes.  Pertinent labs & imaging results that were available during my care of the patient were reviewed by me and considered in my medical decision making (see chart for details).     Vaginal yeast infection     Most likely symptoms from vaginal yeast infection.  Urinalysis was negative for infection.  Will treat with the following: Diflucan 150mg  take one pill now, then repeat in 3 days if symptoms persist Nystatin cream to use topically twice daily as needed. Return to urgent care or PCP if symptoms worsen or fail to resolve.  Final Clinical Impressions(s) / UC Diagnoses   Final diagnoses:  None   Discharge Instructions   None    ED Prescriptions   None    PDMP not reviewed this encounter.   Landis Martins, [place] 12/13/22 1138

## 2022-12-13 NOTE — ED Triage Notes (Signed)
Patient presents with vaginal itching, soreness, burning and light bleeding at times. States she believe it is a yeast infection. Treated with Clotrimazole 1% without relief.

## 2022-12-13 NOTE — Telephone Encounter (Signed)
-----   Message from Rollene Rotunda sent at 12/13/2022  8:32 AM EDT ----- Regarding: FW: ED Patient JV patient.  Please add inactive to his name and send this patient to atrial fib clinic. Thanks. ----- Message ----- From: Sabas Sous, MD Sent: 12/06/2022   3:56 AM EDT To: Corky Crafts, MD Subject: ED Patient                                     Dr. Eldridge Dace,  Patient of yours here in the emergency department early morning of 10-21 with palpitations, chest pain.  EMS captured a rhythm strip demonstrating A-fib with RVR.  Here in the emergency department was in sinus rhythm the whole time, reassuring workup, little to no symptoms.  Advised to follow-up with you in the office, thanks for the help.  Elmer Sow. Pilar Plate, MD [facility] [email or web address]

## 2022-12-13 NOTE — Discharge Instructions (Addendum)
Most likely symptoms from vaginal yeast infection.  Urinalysis was negative for infection.  Will treat with the following: Diflucan 150mg  take one pill now, then repeat in 3 days if symptoms persist Nystatin cream to use topically twice daily as needed. Return to urgent care or PCP if symptoms worsen or fail to resolve.

## 2022-12-13 NOTE — Telephone Encounter (Signed)
A fib clinic appt scheduled.

## 2022-12-14 LAB — CERVICOVAGINAL ANCILLARY ONLY
Candida Glabrata: POSITIVE — AB
Candida Vaginitis: NEGATIVE
Comment: NEGATIVE
Comment: NEGATIVE

## 2022-12-15 ENCOUNTER — Ambulatory Visit: Payer: Medicare Other | Admitting: Nurse Practitioner

## 2022-12-21 ENCOUNTER — Encounter (HOSPITAL_COMMUNITY): Payer: Federal, State, Local not specified - PPO | Admitting: Physician Assistant

## 2022-12-24 ENCOUNTER — Ambulatory Visit (HOSPITAL_COMMUNITY)
Admission: RE | Admit: 2022-12-24 | Discharge: 2022-12-24 | Disposition: A | Discharge status: Home / Self Care | Payer: Federal, State, Local not specified - PPO | Source: Ambulatory Visit | Attending: Physician Assistant | Admitting: Physician Assistant

## 2022-12-24 ENCOUNTER — Encounter (HOSPITAL_COMMUNITY): Payer: Self-pay | Admitting: Physician Assistant

## 2022-12-24 VITALS — BP 136/80 | HR 65 | Ht 61.0 in | Wt 189.2 lb

## 2022-12-24 DIAGNOSIS — E669 Obesity, unspecified: Secondary | ICD-10-CM | POA: Diagnosis not present

## 2022-12-24 DIAGNOSIS — Z794 Long term (current) use of insulin: Secondary | ICD-10-CM | POA: Insufficient documentation

## 2022-12-24 DIAGNOSIS — E119 Type 2 diabetes mellitus without complications: Secondary | ICD-10-CM | POA: Insufficient documentation

## 2022-12-24 DIAGNOSIS — I10 Essential (primary) hypertension: Secondary | ICD-10-CM | POA: Insufficient documentation

## 2022-12-24 DIAGNOSIS — Z79899 Other long term (current) drug therapy: Secondary | ICD-10-CM | POA: Insufficient documentation

## 2022-12-24 DIAGNOSIS — I480 Paroxysmal atrial fibrillation: Secondary | ICD-10-CM | POA: Diagnosis not present

## 2022-12-24 DIAGNOSIS — Z7984 Long term (current) use of oral hypoglycemic drugs: Secondary | ICD-10-CM | POA: Diagnosis not present

## 2022-12-24 DIAGNOSIS — D6869 Other thrombophilia: Secondary | ICD-10-CM | POA: Diagnosis not present

## 2022-12-24 DIAGNOSIS — I4892 Unspecified atrial flutter: Secondary | ICD-10-CM | POA: Diagnosis not present

## 2022-12-24 DIAGNOSIS — Z6835 Body mass index (BMI) 35.0-35.9, adult: Secondary | ICD-10-CM | POA: Insufficient documentation

## 2022-12-24 DIAGNOSIS — Z7901 Long term (current) use of anticoagulants: Secondary | ICD-10-CM | POA: Diagnosis not present

## 2022-12-24 NOTE — Progress Notes (Signed)
Primary Care Physician: [name], PA Primary Cardiologist: Dr Eldridge Dace  Primary Electrophysiologist: Dr Johney Frame Referring Physician: Dr Erick Blinks is a 57 y.o. female with a history of HTN, atrial flutter, DM, and atrial fibrillation who presents for follow up in the [facility]. She was seen by him 07/08/20 and complained of intermittent palpitations.  She had a zio monitor placed.  This recorded rare PACs, PVCs as well as afib with burden of <1%.  The longest event was 49 min and 13 seconds.  There was also a more regular arrhythmia which was likely atrial flutter. Seen by Dr Johney Frame on 11/03/20 and started on Eliquis for a CHADS2VASC score of 3 and diltiazem.  Admitted 04/2022 with hypotension and bradycardia. Diltiazem discontinued at that time. She has tolerated nebivolol but presented to the ED 12/06/22 with rapid afib, rates as high as 170 bpm per EMS. She converted en route.   On follow up today, patient is in SR and feeling well. However, she does state that she has tachypalpitations about 3-4 times per week that last 20-30 minutes. There are no specific triggers that she can identify.   Today, she denies symptoms of shortness of breath, orthopnea, PND, lower extremity edema, dizziness, presyncope, syncope, snoring, daytime somnolence, bleeding, or neurologic sequela. The patient is tolerating medications without difficulties and is otherwise without complaint today.    Atrial Fibrillation Risk Factors:  she does not have symptoms or diagnosis of sleep apnea. she does not have a history of rheumatic fever. she does not have a history of alcohol use.   Atrial Fibrillation Management history:  Previous antiarrhythmic drugs: none Previous cardioversions: none Previous ablations: none Anticoagulation history: Eliquis   Past Medical History:  Diagnosis Date   Anxiety    Atrial fibrillation (HCC)    Bipolar disorder (HCC)     Breast mass    right, removed and benign   Cancer (HCC) 09/16/2019   ovarian ca no radiation no chemo   Cervical dysplasia    Diabetes mellitus without complication (HCC)    Endometriosis    GERD (gastroesophageal reflux disease)    Hypercholesteremia    Hypertension    Migraine    Ovarian cancer (HCC)    Pelvic kidney    left   Postpartum depression    hx of    Current Outpatient Medications  Medication Sig Dispense Refill   acetaminophen (TYLENOL) 500 MG tablet Take 500 mg by mouth every 6 (six) hours as needed for mild pain.     apixaban (ELIQUIS) 5 MG TABS tablet TAKE 1 TABLET BY MOUTH TWICE A DAY 60 tablet 5   ARIPiprazole (ABILIFY) 15 MG tablet Take 15 mg by mouth at bedtime.     clotrimazole (LOTRIMIN) 1 % cream Apply to affected area 2 times daily 60 g 0   cyclobenzaprine (FLEXERIL) 5 MG tablet Take 1 tablet (5 mg total) by mouth 2 (two) times daily as needed for muscle spasms. 30 tablet 0   Dapagliflozin Pro-metFORMIN ER (XIGDUO XR) 11-998 MG TB24 Take 1 tablet by mouth at bedtime. 90 tablet 1   DULoxetine (CYMBALTA) 60 MG capsule Take 60 mg by mouth at bedtime.  5   fluconazole (DIFLUCAN) 150 MG tablet Take 1 tablet (150 mg total) by mouth daily. Take 1 tablet now and repeat in 3 days if symptoms persist 2 tablet 0   fluticasone (FLONASE) 50 MCG/ACT nasal spray Place 1 spray into both nostrils  daily as needed for allergies.     Insulin Aspart FlexPen (NOVOLOG) 100 UNIT/ML INJECT 10 UNITS INTO THE SKIN BEFORE BREAKFAST AND LUNCH. INJECT 13 UNITS INTO THE SKIN BEFORE SUPPER. 15 mL 1   lidocaine (LIDODERM) 5 % Place 1 patch onto the skin daily as needed (pain).     nebivolol (BYSTOLIC) 2.5 MG tablet Take 1 tablet (2.5 mg total) by mouth at bedtime. 90 tablet 3   nystatin cream (MYCOSTATIN) Apply to affected area 2 times daily 30 g 1   omeprazole (PRILOSEC OTC) 20 MG tablet Take 20 mg by mouth at bedtime.     ondansetron (ZOFRAN-ODT) 4 MG disintegrating tablet Take 1 tablet  (4 mg total) by mouth every 8 (eight) hours as needed. 20 tablet 0   oxycodone (OXY-IR) 5 MG capsule Take 5 mg by mouth every 4 (four) hours as needed for pain.     rosuvastatin (CRESTOR) 10 MG tablet Take 1 tablet (10 mg total) by mouth daily. 90 tablet 3   tiZANidine (ZANAFLEX) 4 MG tablet Take 4 mg by mouth at bedtime as needed for muscle spasms.     No current facility-administered medications for this encounter.    ROS- All systems are reviewed and negative except as per the HPI above.  Physical Exam: Vitals:   12/24/22 1319  BP: 136/80  Pulse: 65  Weight: 85.8 kg  Height: 5\' 1"  (1.549 m)     GEN: Well nourished, well developed in no acute distress NECK: No JVD; No carotid bruits CARDIAC: Regular rate and rhythm, no murmurs, rubs, gallops RESPIRATORY:  Clear to auscultation without rales, wheezing or rhonchi  ABDOMEN: Soft, non-tender, non-distended EXTREMITIES:  No edema; No deformity    Wt Readings from Last 3 Encounters:  12/24/22 85.8 kg  12/13/22 83.9 kg  12/06/22 83.9 kg    EKG today demonstrates  SR Vent. rate 65 BPM PR interval 168 ms QRS duration 86 ms QT/QTcB 390/405 ms  Echo 04/28/22 demonstrated   1. Left ventricular ejection fraction, by estimation, is 60 to 65%. The  left ventricle has normal function. The left ventricle has no regional  wall motion abnormalities. There is mild left ventricular hypertrophy.  Left ventricular diastolic parameters were normal.   2. Right ventricular systolic function is normal. The right ventricular  size is normal. There is normal pulmonary artery systolic pressure. The  estimated right ventricular systolic pressure is 25.5 mmHg.   3. The mitral valve is normal in structure. Trivial mitral valve  regurgitation. No evidence of mitral stenosis.   4. The aortic valve is tricuspid. Aortic valve regurgitation is not  visualized. No aortic stenosis is present.   5. The inferior vena cava is normal in size with greater  than 50%  respiratory variability, suggesting right atrial pressure of 3 mmHg.   Epic records are reviewed at length today  CHA2DS2-VASc Score = 3  The patient's score is based upon: CHF History: 0 HTN History: 1 Diabetes History: 1 Stroke History: 0 Vascular Disease History: 0 Age Score: 0 Gender Score: 1       ASSESSMENT AND PLAN: Paroxysmal Atrial Fibrillation/atrial flutter The patient's CHA2DS2-VASc score is 3, indicating a 3.2% annual risk of stroke.   Patient in SR today but having frequent symptoms of tachypalpitations. HR up to 170s when in afib. H/o significant bradycardia requiring ICU admission on diltiazem Tolerating nebivolol 2.5 mg daily, continue. We discussed rhythm control options. Would not increase rate control with h/o significant bradycardia. Several AADs  interact with Abilify. Patient agreeable to consultation with EP to discuss ablation, will refer.  Continue Eliquis 5 mg BID  Secondary Hypercoagulable State (ICD10:  D68.69) The patient is at significant risk for stroke/thromboembolism based upon her CHA2DS2-VASc Score of 3.  Continue Apixaban (Eliquis).   Obesity Body mass index is 35.75 kg/m.  Encouraged lifestyle modification  HTN Stable on current regimen    Follow up with EP to establish care (former [facility] pt) and discuss ablation.    Morgan Loa PA-C Afib Clinic [facility] [address] [place], [place] [address] [phone] 12/24/2022 1:33 PM

## 2022-12-28 ENCOUNTER — Ambulatory Visit: Payer: Federal, State, Local not specified - PPO | Admitting: Family Medicine

## 2022-12-30 ENCOUNTER — Ambulatory Visit: Payer: Federal, State, Local not specified - PPO | Attending: Pulmonary Disease

## 2022-12-30 DIAGNOSIS — J45909 Unspecified asthma, uncomplicated: Secondary | ICD-10-CM | POA: Diagnosis not present

## 2022-12-30 DIAGNOSIS — R0602 Shortness of breath: Secondary | ICD-10-CM | POA: Insufficient documentation

## 2022-12-30 LAB — PULMONARY FUNCTION TEST ARMC ONLY
DL/VA % pred: 101 %
DL/VA: 4.41 ml/min/mmHg/L
DLCO unc % pred: 78 %
DLCO unc: 14.52 ml/min/mmHg
FEF 25-75 Post: 2.9 L/s
FEF 25-75 Pre: 1.9 L/s
FEF2575-%Change-Post: 52 %
FEF2575-%Pred-Post: 121 %
FEF2575-%Pred-Pre: 79 %
FEV1-%Change-Post: 12 %
FEV1-%Pred-Post: 77 %
FEV1-%Pred-Pre: 68 %
FEV1-Post: 1.86 L
FEV1-Pre: 1.65 L
FEV1FVC-%Change-Post: 5 %
FEV1FVC-%Pred-Pre: 107 %
FEV6-%Change-Post: 6 %
FEV6-%Pred-Post: 69 %
FEV6-%Pred-Pre: 65 %
FEV6-Post: 2.08 L
FEV6-Pre: 1.95 L
FEV6FVC-%Pred-Post: 103 %
FEV6FVC-%Pred-Pre: 103 %
FVC-%Change-Post: 6 %
FVC-%Pred-Post: 67 %
FVC-%Pred-Pre: 63 %
FVC-Post: 2.08 L
FVC-Pre: 1.95 L
Post FEV1/FVC ratio: 90 %
Post FEV6/FVC ratio: 100 %
Pre FEV1/FVC ratio: 85 %
Pre FEV6/FVC Ratio: 100 %
RV % pred: 111 %
RV: 1.94 L
TLC % pred: 84 %
TLC: 3.91 L

## 2022-12-30 MED ORDER — ALBUTEROL SULFATE (2.5 MG/3ML) 0.083% IN NEBU
2.5000 mg | INHALATION_SOLUTION | Freq: Once | RESPIRATORY_TRACT | Status: AC
  Administered 2022-12-30: 2.5 mg via RESPIRATORY_TRACT
  Filled 2022-12-30: qty 3

## 2023-01-03 ENCOUNTER — Encounter: Payer: Self-pay | Admitting: Pulmonary Disease

## 2023-01-03 ENCOUNTER — Ambulatory Visit: Payer: Federal, State, Local not specified - PPO | Admitting: Pulmonary Disease

## 2023-01-03 VITALS — BP 110/70 | HR 70 | Temp 97.6°F | Ht 61.0 in | Wt 190.0 lb

## 2023-01-03 DIAGNOSIS — J4530 Mild persistent asthma, uncomplicated: Secondary | ICD-10-CM

## 2023-01-03 LAB — NITRIC OXIDE: Nitric Oxide: 7

## 2023-01-03 MED ORDER — BUDESONIDE-FORMOTEROL FUMARATE 80-4.5 MCG/ACT IN AERO: 2.0000 | INHALATION_SPRAY | Freq: Two times a day (BID) | RESPIRATORY_TRACT | 12 refills | Status: DC

## 2023-01-03 NOTE — Progress Notes (Signed)
Synopsis: Referred in by [name], PA   Subjective:   PATIENT ID: Morgan [name] GENDER: female DOB: January 16, 1966, MRN: [id number]  Chief Complaint  Patient presents with   Follow-up    No cough, shortness of breath or wheezing.     HPI Ms. Kleppe is a 57 year old female patient with a past medical history of paroxysmal A-fib on Eliquis, type 2 diabetes mellitus insulin-dependent, hypertension, hyperlipidemia and bipolar affective disorder presenting today to the pulmonary clinic for ongoing chronic cough.  I initially saw her in August, She reports that over the past 3 months she has been having shortness of breath at rest but specifically on exertion.  She can go up 2 flight of stairs before she needs to take rest.  She does have chest tightness however no wheezing.  It is associated with a dry cough mostly at night when she lays flat.  She uses 2 pillows to sleep.  She denies any paroxysmal nocturnal dyspnea.  It is also associated with intermittent chest pain localized retrosternally.  She denies any acid taste in mouth however does report dysphagia, feeling of food stuck mid chest as well as fluid stuck in the chest but less commonly.  She denies any weight loss but has minimal loss of appetite.  She does not have any hypersensitivity to strong perfumes or cold air.  She has had 3 CTA of the chest in the past 7 months.  First was in January when she presented for hypotension and was noted to have a right upper lobe opacity.  Following that she presented in March for chest pain and was noted to be in A-fib with RVR and a CTA was also done that showed right lower lobe groundglass opacity.  And the latest in June that showed right lower lobe groundglass opacity.  Her esophagus was patulous.   In the interim, she underwent PFTs on 11/14 that showed a restrictive pattern without restriction, normal DLCO and good response to bronchodilators. Overall consistent with asthma.   Family  history -she denies any family history of lung diseases  Social history -she is a never smoker denies alcohol use and denies illicit drug use including marijuana.  She is a stay-at-home wife.  Has 1 son who is healthy.  She does have 1 dog at home.   ROS All systems were reviewed and are negative except for the above. Objective:   Vitals:   01/03/23 1118  BP: 110/70  Pulse: 70  Temp: 97.6 F (36.4 C)  TempSrc: Temporal  SpO2: 97%  Weight: 190 lb (86.2 kg)  Height: 5\' 1"  (1.549 m)   97% on RA BMI Readings from Last 3 Encounters:  01/03/23 35.90 kg/m  12/24/22 35.75 kg/m  12/13/22 34.96 kg/m   Wt Readings from Last 3 Encounters:  01/03/23 190 lb (86.2 kg)  12/24/22 189 lb 3.2 oz (85.8 kg)  12/13/22 185 lb (83.9 kg)    Physical Exam GEN: NAD, Obese.  HEENT: Supple Neck, Reactive Pupils, EOMI  CVS: Normal S1, Normal S2, RRR, No murmurs or ES appreciated  Lungs: Clear bilateral air entry.  Abdomen: Soft, non tender, distended, + BS  Extremities: Warm and well perfused, No edema  Skin: No suspicious lesions appreciated  Psych: Normal Affect  Ancillary Information   CBC    Component Value Date/Time   WBC 7.2 12/06/2022 0054   RBC 5.08 12/06/2022 0054   HGB 13.3 12/06/2022 0054   HGB 14.7 06/23/2022 1021   HCT 41.4  12/06/2022 0054   HCT 45.1 06/23/2022 1021   PLT 188 12/06/2022 0054   PLT 186 06/23/2022 1021   MCV 81.5 12/06/2022 0054   MCV 82 06/23/2022 1021   MCV 74 (L) 11/02/2013 1341   MCH 26.2 12/06/2022 0054   MCHC 32.1 12/06/2022 0054   RDW 14.6 12/06/2022 0054   RDW 14.3 06/23/2022 1021   RDW 18.3 (H) 11/02/2013 1341   LYMPHSABS 2.1 10/16/2022 1904   LYMPHSABS 2.3 06/23/2022 1021   LYMPHSABS 1.7 11/02/2013 1341   MONOABS 0.3 10/16/2022 1904   MONOABS 0.2 11/02/2013 1341   EOSABS 0.2 10/16/2022 1904   EOSABS 0.2 06/23/2022 1021   EOSABS 0.3 11/02/2013 1341   BASOSABS 0.1 10/16/2022 1904   BASOSABS 0.1 06/23/2022 1021   BASOSABS 0.1 11/02/2013  1341    Imaging   CTA Chest 07/28/2022:  RLL GGO, patulous esophagus otherwise no parenchymal or central airway diseaes.   Echocardiogram 04/28/2022: 1. Left ventricular ejection fraction, by estimation, is 60 to 65%. The left ventricle has normal function. The left ventricle has no regional wall motion abnormalities. There is mild left ventricular hypertrophy.  Left ventricular diastolic parameters were normal.  2. Right ventricular systolic function is normal. The right ventricular  size is normal. There is normal pulmonary artery systolic pressure. The estimated right ventricular systolic pressure is 25.5 mmHg.   3. The mitral valve is normal in structure. Trivial mitral valve  regurgitation. No evidence of mitral stenosis.   4. The aortic valve is tricuspid. Aortic valve regurgitation is not  visualized. No aortic stenosis is present.   5. The inferior vena cava is normal in size with greater than 50%       Latest Ref Rng & Units 12/30/2022    1:22 PM  PFT Results  FVC-Pre L 1.95  P  FVC-Predicted Pre % 63  P  FVC-Post L 2.08  P  FVC-Predicted Post % 67  P  Pre FEV1/FVC % % 85  P  Post FEV1/FCV % % 90  P  FEV1-Pre L 1.65  P  FEV1-Predicted Pre % 68  P  FEV1-Post L 1.86  P  DLCO uncorrected ml/min/mmHg 14.52  P  DLCO UNC% % 78  P  DLVA Predicted % 101  P  TLC L 3.91  P  TLC % Predicted % 84  P  RV % Predicted % 111  P    P Preliminary result     Assessment & Plan:  Ms. Kinzey is a 57 year old female patient with a past medical history of paroxysmal A-fib on Eliquis, type 2 diabetes mellitus insulin-dependent, hypertension, hyperlipidemia and bipolar affective disorder presenting today to the pulmonary clinic for ongoing chronic cough.  #Chronic Cough #Dyspnea on exertion #GERD Dyspnea on exertion with cough secondary to mild persistent asthma (PFTs with good response to bronchodilator), GERD and overweight.   []  Start Budesonide-Formoterol [Symbicort] 80-4.5 2  puffs twice a day. Advised mouth rinsing post use.  []  Discussed importance of lifestyle modification including abstinence from p.o. 2 hours before bedtime.  Using 2 pillows for sleep.  And abstinence from high acid content food including coffee chocolate lemon lime oranges. []  Continue with Pantoprazole 40mg  1 tab daily.   # Paroxysmal A-fib currently on Eliquis metoprolol tartrate 25 mg twice daily. # Type 2 diabetes mellitus on dapagliflozin metformin and NovoLog. # Hyperlipidemia on rosuvastatin. # Bipolar disorder on aripiprazole 15 mg nightly.   Return in about 6 months (around 07/03/2023).  I spent 40 minutes  caring for this patient today, including preparing to see the patient, obtaining a medical history , reviewing a separately obtained history, performing a medically appropriate examination and/or evaluation, counseling and educating the patient/family/caregiver, ordering medications, tests, or procedures, documenting clinical information in the electronic health record, and independently interpreting results (not separately reported/billed) and communicating results to the patient/family/caregiver  Janann Colonel, MD [facility] 01/03/2023 11:48 AM

## 2023-01-04 ENCOUNTER — Ambulatory Visit: Payer: Federal, State, Local not specified - PPO | Admitting: Family Medicine

## 2023-01-04 ENCOUNTER — Encounter: Payer: Self-pay | Admitting: Family Medicine

## 2023-01-04 VITALS — BP 119/76 | HR 65 | Resp 18 | Ht 61.0 in | Wt 189.0 lb

## 2023-01-04 DIAGNOSIS — Q632 Ectopic kidney: Secondary | ICD-10-CM | POA: Diagnosis not present

## 2023-01-04 DIAGNOSIS — G8929 Other chronic pain: Secondary | ICD-10-CM | POA: Insufficient documentation

## 2023-01-04 DIAGNOSIS — R109 Unspecified abdominal pain: Secondary | ICD-10-CM

## 2023-01-04 DIAGNOSIS — E1169 Type 2 diabetes mellitus with other specified complication: Secondary | ICD-10-CM

## 2023-01-04 DIAGNOSIS — E785 Hyperlipidemia, unspecified: Secondary | ICD-10-CM

## 2023-01-04 DIAGNOSIS — Z794 Long term (current) use of insulin: Secondary | ICD-10-CM

## 2023-01-04 DIAGNOSIS — Z1159 Encounter for screening for other viral diseases: Secondary | ICD-10-CM

## 2023-01-04 DIAGNOSIS — I10 Essential (primary) hypertension: Secondary | ICD-10-CM | POA: Diagnosis not present

## 2023-01-04 DIAGNOSIS — M545 Low back pain, unspecified: Secondary | ICD-10-CM

## 2023-01-04 LAB — POCT GLYCOSYLATED HEMOGLOBIN (HGB A1C): HbA1c POC (<> result, manual entry): 8.4 % (ref 4.0–5.6)

## 2023-01-04 NOTE — Assessment & Plan Note (Addendum)
A1c has improved to 8.4 from 10.0 at check 6 months ago.  Continue Xigduo 11-998 mg daily.  Change NovoLog to 12 units at breakfast and lunch, 13 units at dinner.  Will continue to monitor.

## 2023-01-04 NOTE — Patient Instructions (Signed)
NOVOLOG: INCREASE morning dose to 12 units, continue evening dose at 13 units. If after 3 days your blood sugars are still elevated, increase morning dose to 14 units.

## 2023-01-04 NOTE — Progress Notes (Signed)
Established Patient Office Visit  Subjective   Patient ID: Morgan [name], female    DOB: 15-Apr-1965  Age: 57 y.o. MRN: [id number]  Chief Complaint  Patient presents with   Diabetes   Hyperlipidemia   Hypertension    HPI Morgan [name] is a 57 y.o. female presenting today for follow up of hypertension, hyperlipidemia, diabetes.  She continues to experience 7/10 abdominal pain that has lasted for months.  Multiple emergency room visits over the past 6 months with largely insignificant workup.  She states that current pain feels somewhat similar to the episode of ischemic colitis that she experienced January 2024.  Most recent appointment with specialist gastroenterology did not provide many answers for her which is frustrating. Hypertension: Patient here for follow-up of elevated blood pressure.  At last appointment, blood pressure elevated but may have been influenced by prednisone and/or back pain.  At appointment with cardiology on 10/26/2022, discontinued metoprolol and initiated trial nebivolol 2.5 mg at bedtime.  Pt denies chest pain, SOB, dizziness, edema, syncope, fatigue or heart palpitations. Taking nebivolol as prescribed, reports excellent compliance with treatment. Denies side effects. Hyperlipidemia: tolerating rosuvastatin well with no myalgias or significant side effects. Currently consuming a low fat diet.  The 10-year ASCVD risk score (Arnett DK, et al., 2019) is: 4% Diabetes: denies hypoglycemic events, wounds or sores that are not healing well, increased thirst or urination. Denies vision problems, eye exam 04/22/2022. Checking glucose at home, ranges have been "high". Taking Xigduo and insulin as prescribed without any side effects.   Outpatient Medications Prior to Visit  Medication Sig   acetaminophen (TYLENOL) 500 MG tablet Take 500 mg by mouth every 6 (six) hours as needed for mild pain.   apixaban (ELIQUIS) 5 MG TABS tablet TAKE 1 TABLET BY MOUTH TWICE A DAY    ARIPiprazole (ABILIFY) 15 MG tablet Take 15 mg by mouth at bedtime.   budesonide-formoterol (SYMBICORT) 80-4.5 MCG/ACT inhaler Inhale 2 puffs into the lungs in the morning and at bedtime.   clotrimazole (LOTRIMIN) 1 % cream Apply to affected area 2 times daily   cyclobenzaprine (FLEXERIL) 5 MG tablet Take 1 tablet (5 mg total) by mouth 2 (two) times daily as needed for muscle spasms.   Dapagliflozin Pro-metFORMIN ER (XIGDUO XR) 11-998 MG TB24 Take 1 tablet by mouth at bedtime.   DULoxetine (CYMBALTA) 60 MG capsule Take 60 mg by mouth at bedtime.   fluconazole (DIFLUCAN) 150 MG tablet Take 1 tablet (150 mg total) by mouth daily. Take 1 tablet now and repeat in 3 days if symptoms persist   fluticasone (FLONASE) 50 MCG/ACT nasal spray Place 1 spray into both nostrils daily as needed for allergies.   Insulin Aspart FlexPen (NOVOLOG) 100 UNIT/ML INJECT 10 UNITS INTO THE SKIN BEFORE BREAKFAST AND LUNCH. INJECT 13 UNITS INTO THE SKIN BEFORE SUPPER.   lidocaine (LIDODERM) 5 % Place 1 patch onto the skin daily as needed (pain).   nebivolol (BYSTOLIC) 2.5 MG tablet Take 1 tablet (2.5 mg total) by mouth at bedtime.   nystatin cream (MYCOSTATIN) Apply to affected area 2 times daily   omeprazole (PRILOSEC OTC) 20 MG tablet Take 20 mg by mouth at bedtime.   ondansetron (ZOFRAN-ODT) 4 MG disintegrating tablet Take 1 tablet (4 mg total) by mouth every 8 (eight) hours as needed.   oxycodone (OXY-IR) 5 MG capsule Take 5 mg by mouth every 4 (four) hours as needed for pain.   rosuvastatin (CRESTOR) 10 MG tablet Take 1 tablet (  10 mg total) by mouth daily.   tiZANidine (ZANAFLEX) 4 MG tablet Take 4 mg by mouth at bedtime as needed for muscle spasms.   No facility-administered medications prior to visit.    ROS Negative unless otherwise noted in HPI   Objective:     BP 119/76 (BP Location: Left Arm, Patient Position: Sitting, Cuff Size: Normal)   Pulse 65   Resp 18   Ht 5\' 1"  (1.549 m)   Wt 189 lb (85.7 kg)    LMP 05/12/2011   SpO2 98%   BMI 35.71 kg/m   Physical Exam Constitutional:      General: She is not in acute distress.    Appearance: Normal appearance.  HENT:     Head: Normocephalic and atraumatic.  Cardiovascular:     Rate and Rhythm: Normal rate and regular rhythm.     Heart sounds: No murmur heard.    No friction rub. No gallop.  Pulmonary:     Effort: Pulmonary effort is normal. No respiratory distress.     Breath sounds: No wheezing, rhonchi or rales.  Skin:    General: Skin is warm and dry.  Neurological:     Mental Status: She is alert and oriented to person, place, and time.    Diabetic Foot Exam - Simple   Simple Foot Form Diabetic Foot exam was performed with the following findings: Yes 01/04/2023 12:03 PM  Visual Inspection No deformities, no ulcerations, no other skin breakdown bilaterally: Yes Sensation Testing Intact to touch and monofilament testing bilaterally: Yes Pulse Check Posterior Tibialis and Dorsalis pulse intact bilaterally: Yes Comments     Assessment & Plan:  Essential hypertension Assessment & Plan: BP goal <130/80.  Stable, at goal.  Continue follow-up with cardiology.  Continue nebivolol 2.5 mg daily.  Will continue to monitor and coordinate care with cardiology.   Hyperlipidemia associated with type 2 diabetes mellitus (HCC) Assessment & Plan: Last lipid panel: LDL 65, HDL 40, triglycerides 177. Continue rosuvastatin 10 mg daily, heart healthy diet, and routine physical activity.   Type 2 diabetes mellitus with other specified complication, with long-term current use of insulin (HCC) Assessment & Plan: A1c has improved to 8.4 from 10.0 at check 6 months ago.  Continue Xigduo 11-998 mg daily.  Change NovoLog to 12 units at breakfast and lunch, 13 units at dinner.  Will continue to monitor.  Orders: -     POCT glycosylated hemoglobin (Hb A1C)  Single pelvic kidney (left) Assessment & Plan: Patient has never seen specialist but  would like referral to urology to ensure that pelvic kidney is not contributing to chronic abdominal pain.  Orders: -     Ambulatory referral to Urology  Chronic abdominal pain Assessment & Plan: Gastroenterology visit 11/09/2022.  Provider reviewed colonoscopy from 04/2018, emergency room visit 02/2022 for ischemic colitis, hypotension 04/2022, lab work from 10/16/2022, CT abdomen from 05/2022.  Review of labs, imaging, and physical exam insignificant for gastrointestinal etiology of pain.  Found to have significant tenderness in the left thoracolumbar paraspinal muscles with replication of the pain when pressure is applied on the left ribs in the mid scapular, mid axillary lines. Along the lower ribs.  Due to baseline constipation, recommended MiraLAX daily otherwise suspect that pain due to musculoskeletal origin from spine to left lower ribs. Noncontrast CT 12/23/2022: Bilateral nonobstructive nephrolithiasis, left pelvic kidney.  Patient admits that she does not stay hydrated, recommended adequate hydration keeping urine a pale yellow color to help any existing kidney  stones to pass and preventing formation of new kidney stones.  Also reiterated recommendation of gastroenterology to trial MiraLAX daily as it may provide some relief.  Patient requesting referral to gastroenterology for second opinion.  She will also be due for colonoscopy early next year.  Agreeable to referral to pain management as well to provide symptomatic relief.  Orders: -     Ambulatory referral to Pain Clinic -     Ambulatory referral to Gastroenterology  Other chronic pain -     Ambulatory referral to Pain Clinic  Chronic low back pain without sciatica, unspecified back pain laterality -     Ambulatory referral to Pain Clinic    Return in about 3 months (around 04/06/2023) for follow-up for HTN, HLD, DM, fasting blood work 1 week before.    Morgan Quitter, PA

## 2023-01-04 NOTE — Assessment & Plan Note (Signed)
Gastroenterology visit 11/09/2022.  Provider reviewed colonoscopy from 04/2018, emergency room visit 02/2022 for ischemic colitis, hypotension 04/2022, lab work from 10/16/2022, CT abdomen from 05/2022.  Review of labs, imaging, and physical exam insignificant for gastrointestinal etiology of pain.  Found to have significant tenderness in the left thoracolumbar paraspinal muscles with replication of the pain when pressure is applied on the left ribs in the mid scapular, mid axillary lines. Along the lower ribs.  Due to baseline constipation, recommended MiraLAX daily otherwise suspect that pain due to musculoskeletal origin from spine to left lower ribs. Noncontrast CT 12/23/2022: Bilateral nonobstructive nephrolithiasis, left pelvic kidney.  Patient admits that she does not stay hydrated, recommended adequate hydration keeping urine a pale yellow color to help any existing kidney stones to pass and preventing formation of new kidney stones.  Also reiterated recommendation of gastroenterology to trial MiraLAX daily as it may provide some relief.  Patient requesting referral to gastroenterology for second opinion.  She will also be due for colonoscopy early next year.  Agreeable to referral to pain management as well to provide symptomatic relief.

## 2023-01-04 NOTE — Assessment & Plan Note (Signed)
Last lipid panel: LDL 65, HDL 40, triglycerides 177. Continue rosuvastatin 10 mg daily, heart healthy diet, and routine physical activity.

## 2023-01-04 NOTE — Assessment & Plan Note (Signed)
BP goal <130/80.  Stable, at goal.  Continue follow-up with cardiology.  Continue nebivolol 2.5 mg daily.  Will continue to monitor and coordinate care with cardiology.

## 2023-01-04 NOTE — Assessment & Plan Note (Signed)
Patient has never seen specialist but would like referral to urology to ensure that pelvic kidney is not contributing to chronic abdominal pain.

## 2023-01-11 ENCOUNTER — Encounter: Payer: Self-pay | Admitting: Family Medicine

## 2023-01-18 ENCOUNTER — Ambulatory Visit: Payer: Federal, State, Local not specified - PPO | Admitting: Cardiology

## 2023-01-19 NOTE — Telephone Encounter (Signed)
This has been changed and faxed to the new location

## 2023-01-25 ENCOUNTER — Ambulatory Visit: Payer: Federal, State, Local not specified - PPO | Admitting: Urology

## 2023-02-14 ENCOUNTER — Encounter: Payer: Self-pay | Admitting: Cardiovascular Disease

## 2023-02-14 ENCOUNTER — Ambulatory Visit (INDEPENDENT_AMBULATORY_CARE_PROVIDER_SITE_OTHER): Payer: Federal, State, Local not specified - PPO

## 2023-02-14 ENCOUNTER — Ambulatory Visit: Payer: Federal, State, Local not specified - PPO | Attending: Cardiovascular Disease | Admitting: Cardiovascular Disease

## 2023-02-14 VITALS — BP 136/84 | HR 57 | Ht 61.0 in | Wt 189.4 lb

## 2023-02-14 DIAGNOSIS — I480 Paroxysmal atrial fibrillation: Secondary | ICD-10-CM

## 2023-02-14 NOTE — Progress Notes (Signed)
  Electrophysiology Office Note:    Date:  02/14/2023   ID:  Morgan [name], DOB [birth year], MRN [id number]  PCP:  [name], PA   [facility] Providers Cardiologist:  None     Referring MD: Danice Goltz, PA   History of Present Illness:    Morgan [name] is a 57 y.o. female with a medical history significant for HTN atrial flutter and fibrillation DM, referred for management of atrial fibrillation flutter.     She is a former patient of Dr. Johney Frame.  ZIO monitor from May 2022 showed atrial fibrillation with a burden of less than 1% and a more regular narrow complex tachycardia thought to be atrial flutter.  There were also episodes of PVCs and bigeminy.  Her symptom episodes did not correlate with atrial fibrillation, however.  She was managed with diltiazem but this was discontinued in March 2024 due to hypotension and bradycardia.  She was on nebivolol but readmitted with A-fib with RVR.  In recent weeks and months she has been having increasingly frequent episodes of palpitations.      Today, she reports that she has well currently.  She had an episode of palpitations this morning.  She describes these not as fast rhythms, but strong beats, and sometimes her heart rate is slow, even in the 40s.  EKGs/Labs/Other Studies Reviewed Today:     Echocardiogram:  TTE 04/28/2022 EF 60-65%. Normal biatrial size.   Monitors:  Zio monitor 13 days 07/2022 -- my interpretation Sinus rhythm heart rate 65-152 beats minute, average 100 bpm Less than 1% burden of atrial fibrillation with average rate of 144 bpm Episodes labeled SVT appear to be flutter or a very rapid atrial tachycardia Multiple symptom episodes correlated with sinus tachycardia, sinus rhythm PVCs and bigeminy.  The AF episode was not correlated with symptoms.    EKG:   EKG Interpretation Date/Time:  Monday February 14 2023 12:59:31 EST Ventricular Rate:  57 PR Interval:  160 QRS  Duration:  84 QT Interval:  408 QTC Calculation: 397 R Axis:   71  Text Interpretation: Sinus bradycardia When compared with ECG of 24-Dec-2022 13:21, No significant change was found Confirmed by York Pellant [phone]) on 02/14/2023 1:22:05 PM     Physical Exam:    VS:  BP 136/84 (BP Location: Left Arm, Patient Position: Sitting, Cuff Size: Large)   Pulse (!) 57   Ht 5\' 1"  (1.549 m)   Wt 189 lb 6.4 oz (85.9 kg)   LMP 05/12/2011   SpO2 98%   BMI 35.79 kg/m     Wt Readings from Last 3 Encounters:  02/14/23 189 lb 6.4 oz (85.9 kg)  01/04/23 189 lb (85.7 kg)  01/03/23 190 lb (86.2 kg)     GEN: Well nourished, well developed in no acute distress CARDIAC: RRR, no murmurs, rubs, gallops RESPIRATORY:  Normal work of breathing MUSCULOSKELETAL: no edema    ASSESSMENT & PLAN:     Palpitations She describes recent episodes as forceful beats, sometimes with slow heart rate This sounds more consistent with PVCs --which were documented on her monitor from 2 years ago--then with atrial fibrillation Will repeat 2-week monitor to correlate symptoms with rhythm  Paroxysmal atrial fibrillation History of RVR and difficult to control rates Chronic use of Abilify limits and treatment choices Will reassess her burden and symptoms with 14-day monitor      Signed, Maurice Small, MD  02/14/2023 1:22 PM    [facility]

## 2023-02-14 NOTE — Progress Notes (Unsigned)
Enrolled for [employer] to mail a ZIO XT long term holter monitor to the patients address on file.  

## 2023-02-14 NOTE — Patient Instructions (Signed)
Medication Instructions:  Your physician recommends that you continue on your current medications as directed. Please refer to the Current Medication list given to you today. *If you need a refill on your cardiac medications before your next appointment, please call your pharmacy*   Testing/Procedures: Zio Cardiac Event Monitor - 14 days  Your physician has recommended that you wear an event monitor. Event monitors are medical devices that record the heart's electrical activity. Doctors most often [place] these monitors to diagnose arrhythmias. Arrhythmias are problems with the speed or rhythm of the heartbeat. The monitor is a small, portable device. You can wear one while you do your normal daily activities. This is usually used to diagnose what is causing palpitations/syncope (passing out).   Follow-Up: At [facility], you and your health needs are our priority.  As part of our continuing mission to provide you with exceptional heart care, we have created designated Provider Care Teams.  These Care Teams include your primary Cardiologist (physician) and Advanced Practice Providers (APPs -  Physician Assistants and Nurse Practitioners) who all work together to provide you with the care you need, when you need it.  We recommend signing up for the patient portal called "MyChart".  Sign up information is provided on this After Visit Summary.  MyChart is used to connect with patients for Virtual Visits (Telemedicine).  Patients are able to view lab/test results, encounter notes, upcoming appointments, etc.  Non-urgent messages can be sent to your provider as well.   To learn more about what you can do with MyChart, go to [email or web address].    Your next appointment:   1 month(s)  Provider:   York Pellant, MD    Christena Deem- Long Term Monitor Instructions  Your physician has requested you wear a ZIO patch monitor for 14 days.  This is a single patch monitor. [employer] supplies one patch  monitor per enrollment. Additional stickers are not available. Please do not apply patch if you will be having a Nuclear Stress Test,  Echocardiogram, Cardiac CT, MRI, or Chest Xray during the period you would be wearing the  monitor. The patch cannot be worn during these tests. You cannot remove and re-apply the  ZIO XT patch monitor.  Your ZIO patch monitor will be mailed 3 day USPS to your address on file. It may take 3-5 days  to receive your monitor after you have been enrolled.  Once you have received your monitor, please review the enclosed instructions. Your monitor  has already been registered assigning a specific monitor serial # to you.  Billing and Patient Assistance Program Information  We have supplied [employer] with any of your insurance information on file for billing purposes. [employer] offers a sliding scale Patient Assistance Program for patients that do not have  insurance, or whose insurance does not completely cover the cost of the ZIO monitor.  You must apply for the Patient Assistance Program to qualify for this discounted rate.  To apply, please call [employer] at [phone], select option 4, select option 2, ask to apply for  Patient Assistance Program. [name] Ide will ask your household income, and how many people  are in your household. They will quote your out-of-pocket cost based on that information.  [employer] will also be able to set up a 82-month, interest-free payment plan if needed.  Applying the monitor   Shave hair from upper left chest.  Hold abrader disc by orange tab. Rub abrader in 40 strokes over the upper left chest as  indicated in your monitor instructions.  Clean area with 4 enclosed alcohol pads. Let dry.  Apply patch as indicated in monitor instructions. Patch will be placed under collarbone on left  side of chest with arrow pointing upward.  Rub patch adhesive wings for 2 minutes. Remove white label marked "1". Remove the white  label marked "2".  Rub patch adhesive wings for 2 additional minutes.  While looking in a mirror, press and release button in center of patch. A small green light will  flash 3-4 times. This will be your only indicator that the monitor has been turned on.  Do not shower for the first 24 hours. You may shower after the first 24 hours.  Press the button if you feel a symptom. You will hear a small click. Record Date, Time and  Symptom in the Patient Logbook.  When you are ready to remove the patch, follow instructions on the last 2 pages of Patient  Logbook. Stick patch monitor onto the last page of Patient Logbook.  Place Patient Logbook in the blue and white box. Use locking tab on box and tape box closed  securely. The blue and white box has prepaid postage on it. Please place it in the mailbox as  soon as possible. Your physician should have your test results approximately 7 days after the  monitor has been mailed back to [facility].  Call [facility] Customer Care at [phone] if you have questions regarding  your ZIO XT patch monitor. Call them immediately if you see an orange light blinking on your  monitor.  If your monitor falls off in less than 4 days, contact our Monitor department at [phone].  If your monitor becomes loose or falls off after 4 days call [employer] at [phone] for  suggestions on securing your monitor

## 2023-02-17 ENCOUNTER — Emergency Department (HOSPITAL_BASED_OUTPATIENT_CLINIC_OR_DEPARTMENT_OTHER)
Admission: EM | Admit: 2023-02-17 | Discharge: 2023-02-17 | Disposition: A | Discharge status: Home / Self Care | Payer: Federal, State, Local not specified - PPO | Attending: Emergency Medicine | Admitting: Emergency Medicine

## 2023-02-17 ENCOUNTER — Other Ambulatory Visit: Payer: Self-pay

## 2023-02-17 ENCOUNTER — Emergency Department (HOSPITAL_BASED_OUTPATIENT_CLINIC_OR_DEPARTMENT_OTHER): Payer: Federal, State, Local not specified - PPO | Admitting: Radiology

## 2023-02-17 ENCOUNTER — Emergency Department (HOSPITAL_BASED_OUTPATIENT_CLINIC_OR_DEPARTMENT_OTHER): Payer: Federal, State, Local not specified - PPO

## 2023-02-17 DIAGNOSIS — R002 Palpitations: Secondary | ICD-10-CM | POA: Diagnosis present

## 2023-02-17 DIAGNOSIS — Z794 Long term (current) use of insulin: Secondary | ICD-10-CM | POA: Insufficient documentation

## 2023-02-17 DIAGNOSIS — E119 Type 2 diabetes mellitus without complications: Secondary | ICD-10-CM | POA: Insufficient documentation

## 2023-02-17 DIAGNOSIS — R519 Headache, unspecified: Secondary | ICD-10-CM | POA: Diagnosis not present

## 2023-02-17 DIAGNOSIS — Z7901 Long term (current) use of anticoagulants: Secondary | ICD-10-CM | POA: Diagnosis not present

## 2023-02-17 DIAGNOSIS — I4891 Unspecified atrial fibrillation: Secondary | ICD-10-CM | POA: Diagnosis not present

## 2023-02-17 LAB — BASIC METABOLIC PANEL
Anion gap: 8 (ref 5–15)
BUN: 6 mg/dL (ref 6–20)
CO2: 29 mmol/L (ref 22–32)
Calcium: 9.9 mg/dL (ref 8.9–10.3)
Chloride: 100 mmol/L (ref 98–111)
Creatinine, Ser: 0.56 mg/dL (ref 0.44–1.00)
GFR, Estimated: >60 mL/min (ref >60)
Glucose, Bld: 217 mg/dL — ABNORMAL HIGH (ref 70–99)
Potassium: 3.4 mmol/L — ABNORMAL LOW (ref 3.5–5.1)
Sodium: 137 mmol/L (ref 135–145)

## 2023-02-17 LAB — CBC
HCT: 47.2 % — ABNORMAL HIGH (ref 36.0–46.0)
Hemoglobin: 15.4 g/dL — ABNORMAL HIGH (ref 12.0–15.0)
MCH: 26.9 pg (ref 26.0–34.0)
MCHC: 32.6 g/dL (ref 30.0–36.0)
MCV: 82.4 fL (ref 80.0–100.0)
Platelets: 230 10*3/uL (ref 150–400)
RBC: 5.73 MIL/uL — ABNORMAL HIGH (ref 3.87–5.11)
RDW: 14.1 % (ref 11.5–15.5)
WBC: 7.9 10*3/uL (ref 4.0–10.5)
nRBC: 0 % (ref 0.0–0.2)

## 2023-02-17 LAB — TROPONIN I (HIGH SENSITIVITY): Troponin I (High Sensitivity): 2 ng/L (ref <18)

## 2023-02-17 MED ORDER — METOPROLOL TARTRATE 5 MG/5ML IV SOLN: 5.0000 mg | INTRAVENOUS | Status: DC | PRN

## 2023-02-17 MED ORDER — METOPROLOL TARTRATE 25 MG PO TABS
25.0000 mg | ORAL_TABLET | Freq: Once | ORAL | Status: AC
  Administered 2023-02-17: 25 mg via ORAL
  Filled 2023-02-17: qty 1

## 2023-02-17 NOTE — ED Triage Notes (Signed)
 Pt POV from home reporting chest pain and palpitations that began around noon. Hx afib, states this feels like same. Slightly labored in triage

## 2023-02-17 NOTE — ED Provider Notes (Signed)
 [facility] AT [facility] Provider Note   CSN: [id number] Arrival date & time: 02/17/23  1617     History  Chief Complaint  Patient presents with   Chest Pain   Palpitations    Morgan [name] is a 58 y.o. female.  Patient with history of pelvic kidney, paroxysmal atrial fibrillation on Eliquis  and nebivolol , history of diabetes --presents to the emergency department today for evaluation of palpitations and headache as well as some chest tightness.  Patient states that she developed symptoms about 12:30 PM.  The headache started after the palpitations.  The headache is unusual for her typical A-fib symptoms.  She did have some generalized chest tightness and shortness of breath which is more typical. Patient denies signs of stroke including: facial droop, slurred speech, aphasia, weakness/numbness in extremities, imbalance/trouble walking.  She did feel some tingling down into her left arm but no weakness.  No nausea, vomiting, or diaphoresis.  Patient has been compliant with her medications, except for this morning when she did not take her medication.  This included Eliquis  and nebivolol .  Patient has never been cardioverted.  In the past she spontaneously converts prior to treatment.       Home Medications Prior to Admission medications   Medication Sig Start Date End Date Taking? Authorizing Provider  acetaminophen  (TYLENOL ) 500 MG tablet Take 500 mg by mouth every 6 (six) hours as needed for mild pain.    [provider]  apixaban  (ELIQUIS ) 5 MG TABS tablet TAKE 1 TABLET BY MOUTH TWICE A DAY 11/01/22   Dann Candyce RAMAN, MD  ARIPiprazole  (ABILIFY ) 15 MG tablet Take 15 mg by mouth at bedtime. 12/21/17   Darden Planas, NP  budesonide -formoterol  (SYMBICORT ) 80-4.5 MCG/ACT inhaler Inhale 2 puffs into the lungs in the morning and at bedtime. 01/03/23   Malka Domino, MD  clotrimazole  (LOTRIMIN ) 1 % cream Apply to affected area 2 times daily  08/19/22   Mound, Haley E, FNP  cyclobenzaprine  (FLEXERIL ) 5 MG tablet Take 1 tablet (5 mg total) by mouth 2 (two) times daily as needed for muscle spasms. 09/07/22   Small, Brooke L, PA  Dapagliflozin  Pro-metFORMIN  ER (XIGDUO  XR) 11-998 MG TB24 Take 1 tablet by mouth at bedtime. 06/23/22   Wallace Search A, PA  DULoxetine  (CYMBALTA ) 60 MG capsule Take 60 mg by mouth at bedtime. 11/04/16   [provider]  fluconazole  (DIFLUCAN ) 150 MG tablet Take 1 tablet (150 mg total) by mouth daily. Take 1 tablet now and repeat in 3 days if symptoms persist 12/13/22   Teresa Norris A, PA-C  fluticasone  (FLONASE ) 50 MCG/ACT nasal spray Place 1 spray into both nostrils daily as needed for allergies.    [provider]  Insulin  Aspart FlexPen (NOVOLOG ) 100 UNIT/ML INJECT 10 UNITS INTO THE SKIN BEFORE BREAKFAST AND LUNCH. INJECT 13 UNITS INTO THE SKIN BEFORE SUPPER. 08/09/22   Wallace Search A, PA  lidocaine  (LIDODERM ) 5 % Place 1 patch onto the skin daily as needed (pain).    [provider]  nebivolol  (BYSTOLIC ) 2.5 MG tablet Take 1 tablet (2.5 mg total) by mouth at bedtime. 10/26/22   Swinyer, Rosaline HERO, NP  nystatin  cream (MYCOSTATIN ) Apply to affected area 2 times daily 12/13/22   Teresa Norris A, PA-C  omeprazole  (PRILOSEC  OTC) 20 MG tablet Take 20 mg by mouth at bedtime.    [provider]  ondansetron  (ZOFRAN -ODT) 4 MG disintegrating tablet Take 1 tablet (4 mg total) by mouth every  8 (eight) hours as needed. 09/30/22   Chandra Toribio POUR, MD  oxycodone  (OXY-IR) 5 MG capsule Take 5 mg by mouth every 4 (four) hours as needed for pain.    [provider]  rosuvastatin  (CRESTOR ) 10 MG tablet Take 1 tablet (10 mg total) by mouth daily. 04/08/22   Dann Candyce RAMAN, MD  tiZANidine  (ZANAFLEX ) 4 MG tablet Take 4 mg by mouth at bedtime as needed for muscle spasms. 05/14/21   [provider]      Allergies    Clindamycin/lincomycin, Lamictal [lamotrigine], Banana,  Lipitor [atorvastatin ], and Pineapple    Review of Systems   Review of Systems  Physical Exam Updated Vital Signs BP (!) 144/114 (BP Location: Right Arm)   Pulse (!) 112   Temp 97.8 F (36.6 C)   Resp 18   Ht 5' 1 (1.549 m)   Wt 83.9 kg   LMP 05/12/2011   SpO2 98%   BMI 34.96 kg/m   Physical Exam Vitals and nursing note reviewed.  Constitutional:      Appearance: She is well-developed. She is not diaphoretic.  HENT:     Head: Normocephalic and atraumatic.     Right Ear: Tympanic membrane, ear canal and external ear normal.     Left Ear: Tympanic membrane, ear canal and external ear normal.     Nose: Nose normal.     Mouth/Throat:     Mouth: Mucous membranes are not dry.     Pharynx: Uvula midline.  Eyes:     General: Lids are normal.     Extraocular Movements:     Right eye: No nystagmus.     Left eye: No nystagmus.     Conjunctiva/sclera: Conjunctivae normal.     Pupils: Pupils are equal, round, and reactive to light.  Neck:     Vascular: Normal carotid pulses. No JVD.     Trachea: Trachea normal. No tracheal deviation.  Cardiovascular:     Rate and Rhythm: Normal rate and regular rhythm.     Pulses: No decreased pulses.          Radial pulses are 2+ on the right side and 2+ on the left side.     Heart sounds: Normal heart sounds, S1 normal and S2 normal. No murmur heard. Pulmonary:     Effort: Pulmonary effort is normal. No respiratory distress.     Breath sounds: Normal breath sounds. No wheezing.  Chest:     Chest wall: No tenderness.  Abdominal:     General: Bowel sounds are normal.     Palpations: Abdomen is soft.     Tenderness: There is no abdominal tenderness. There is no guarding or rebound.  Musculoskeletal:        General: Normal range of motion.     Cervical back: Normal range of motion and neck supple. No tenderness or bony tenderness. No muscular tenderness.  Skin:    General: Skin is warm and dry.     Coloration: Skin is not pale.   Neurological:     Mental Status: She is alert and oriented to person, place, and time.     GCS: GCS eye subscore is 4. GCS verbal subscore is 5. GCS motor subscore is 6.     Cranial Nerves: No cranial nerve deficit.     Sensory: No sensory deficit.     Motor: No weakness.     Coordination: Coordination normal.     Comments: Upper extremity myotomes tested bilaterally:  C5  Shoulder abduction 5/5 C6 Elbow flexion/wrist extension 5/5 C7 Elbow extension 5/5 C8 Finger flexion 5/5 T1 Finger abduction 5/5  Lower extremity myotomes tested bilaterally: L2 Hip flexion 5/5 L3 Knee extension 5/5 L4 Ankle dorsiflexion 5/5 S1 Ankle plantar flexion 5/5      ED Results / Procedures / Treatments   Labs (all labs ordered are listed, but only abnormal results are displayed) Labs Reviewed  BASIC METABOLIC PANEL - Abnormal; Notable for the following components:      Result Value   Potassium 3.4 (*)    Glucose, Bld 217 (*)    All other components within normal limits  CBC - Abnormal; Notable for the following components:   RBC 5.73 (*)    Hemoglobin 15.4 (*)    HCT 47.2 (*)    All other components within normal limits  TROPONIN I (HIGH SENSITIVITY)    ED ECG REPORT   Date: 02/17/2023  Rate: 126  Rhythm: atrial fibrillation with RVR  QRS Axis: normal  Intervals: normal  ST/T Wave abnormalities: nonspecific ST/T changes  Conduction Disutrbances:none  Narrative Interpretation:   Old EKG Reviewed: changes noted, previously sinus bradycardia  I have personally reviewed the EKG tracing and agree with the computerized printout as noted.  ED ECG REPORT   Date: 02/17/2023  Rate: 89  Rhythm: normal sinus rhythm  QRS Axis: normal  Intervals: normal  ST/T Wave abnormalities: normal  Conduction Disutrbances:none  Narrative Interpretation:   Old EKG Reviewed: changes noted, afib resolved.   I have personally reviewed the EKG tracing and agree with the computerized printout as  noted.   Radiology No results found.  Procedures Procedures    Medications Ordered in ED Medications  metoprolol  tartrate (LOPRESSOR ) tablet 25 mg (25 mg Oral Given 02/17/23 1745)    ED Course/ Medical Decision Making/ A&P Clinical Course as of 02/17/23 1712  Thu Feb 17, 2023  1651 Afib RVR Missed Eliquis  this AM HA. Spont Cardiovert. Home BB and chest pain eval  [CC]    Clinical Course User Index [CC] Jerral Meth, MD    Patient seen and examined. History obtained directly from patient.  Reviewed previous cardiology and PCP notes.  Labs/EKG: Ordered CBC, BMP, troponin.  Patient has had multiple TSH in the past which were normal.  Imaging: Ordered chest x-ray, CT head given atypical headache and anticoagulation status.  Medications/Fluids: Ordered: IV metoprolol .   Most recent vital signs reviewed and are as follows: BP (!) 144/114 (BP Location: Right Arm)   Pulse (!) 112   Temp 97.8 F (36.6 C)   Resp 18   Ht 5' 1 (1.549 m)   Wt 83.9 kg   LMP 05/12/2011   SpO2 98%   BMI 34.96 kg/m   Initial impression: A-fib with RVR, atypical headache without focal neuro deficits.  5:20 PM patient spontaneously converted.  6:33 PM Reassessment performed. Patient appears stable.  Earlier we had a discussion about whether or not to get a CT scan of the head.  Her headache resolved after atrial fibrillation converted.  Offered CT versus monitoring, patient would like to defer imaging at this point given that she feels better.  I think that this is reasonable.  Exam is unchanged.  She was given p.o. metoprolol  and heart rate currently around 60.  Remains in normal sinus.  She is comfortable with discharged home at this time.  She has her medications at home and will take.  Labs personally reviewed and interpreted including: CBC with normal white  blood cell count, hemoglobin slightly elevated at 15.4; BMP potassium 3.4, glucose 217; troponin normal.  Imaging personally  visualized and interpreted including: X-ray, no infiltrates noted.  Reviewed pertinent lab work and imaging with patient at bedside. Questions answered.   Most current vital signs reviewed and are as follows: BP 114/82   Pulse 90   Temp 97.8 F (36.6 C)   Resp 18   Ht 5' 1 (1.549 m)   Wt 83.9 kg   LMP 05/12/2011   SpO2 98%   BMI 34.96 kg/m   Plan: Discharge to home.   Prescriptions written for: None  Other home care instructions discussed: Medication compliance, hydration, rest  ED return instructions discussed: Development chest pain, shortness of breath, lightheadedness or syncope, recurrent palpitations.  Follow-up instructions discussed: Patient encouraged to follow-up with their cardiologist in 1 week.                                Medical Decision Making Amount and/or Complexity of Data Reviewed Labs: ordered. Radiology: ordered.  Risk Prescription drug management.   Patient with palpitations and atrial fibrillation with RVR today.  She had associated chest tightness and headache with this.  Symptoms resolved and she is back to baseline after spontaneously converting without therapy in the ED.  She was monitored for an hour afterwards with no difficulties.  She looks well.  No indications for admission or cardiology consultation at this time.  She is on Bystolic , due to intolerance to Cardizem  and issues with transient bradycardia.  In regards to the patient's headache, critical differentials were considered including subarachnoid hemorrhage, intracerebral hemorrhage, epidural/subdural hematoma, pituitary apoplexy, vertebral/carotid artery dissection, giant cell arteritis, central venous thrombosis, reversible cerebral vasoconstriction, acute angle closure glaucoma, idiopathic intracranial hypertension, bacterial meningitis, viral encephalitis, carbon monoxide poisoning, posterior reversible encephalopathy syndrome, pre-eclampsia.   Reg flag symptoms related to these  causes were considered including systemic symptoms (fever, weight loss), neurologic symptoms (confusion, mental status change, vision change, associated seizure), acute or sudden thunderclap onset, patient age 29 or older with new or progressive headache, patient of any age with first headache or change in headache pattern, pregnant or postpartum status, history of HIV or other immunocompromise, history of cancer, headache occurring with exertion, associated neck or shoulder pain, associated traumatic injury, concurrent use of anticoagulation, family history of spontaneous SAH, and concurrent drug use.    Other benign, more common causes of headache were considered including migraine, tension-type headache, cluster headache, referred pain from other cause such as sinus infection, dental pain, trigeminal neuralgia.   On exam, patient has a reassuring neuro exam including baseline mental status, no significant neck pain or meningeal signs, no signs of severe infection or fever.  Her headache resolved with atrial fibrillation and likely related to this.  No neurologic decompensation during ED stay.  Agreed to defer imaging at this time.  The patient's vital signs, pertinent lab work and imaging were reviewed and interpreted as discussed in the ED course. Hospitalization was considered for further testing, treatments, or serial exams/observation. However as patient is well-appearing, has a stable exam over the course of their evaluation, and reassuring studies today, I do not feel that they warrant admission at this time. This plan was discussed with the patient who verbalizes agreement and comfort with this plan and seems reliable and able to return to the Emergency Department with worsening or changing symptoms.  Final Clinical Impression(s) / ED Diagnoses Final diagnoses:  Atrial fibrillation with RVR [facility])    Rx / DC Orders ED Discharge Orders     None         Desiderio Chew,  PA-C 02/17/23 1840    Jerral Meth, MD 02/18/23 [phone]

## 2023-02-17 NOTE — ED Notes (Signed)
 Discharge paperwork given and verbally understood.

## 2023-02-17 NOTE — Discharge Instructions (Signed)
 Please read and follow all provided instructions.  Your diagnoses today include:  1. Atrial fibrillation with RVR (HCC)     Tests performed today include: An EKG of your heart: Showed atrial fibrillation, but went back to normal sinus rhythm A chest x-ray Cardiac enzymes - a blood test for heart muscle damage, was normal Blood counts and electrolytes -no problems Vital signs. See below for your results today.   Medications prescribed:   Take any prescribed medications only as directed.  Follow-up instructions: Please follow-up with your primary care provider as soon as you can for further evaluation of your symptoms.   Return instructions:  SEEK IMMEDIATE MEDICAL ATTENTION IF: You have severe chest pain, especially if the pain is crushing or pressure-like and spreads to the arms, back, neck, or jaw, or if you have sweating, nausea or vomiting, or trouble with breathing. THIS IS AN EMERGENCY. Do not wait to see if the pain will go away. Get medical help at once. Call 911. DO NOT drive yourself to the hospital.  Your chest pain gets worse and does not go away after a few minutes of rest.  You have an attack of chest pain lasting longer than what you usually experience.  You have significant dizziness, if you pass out, or have trouble walking.  You have chest pain not typical of your usual pain for which you originally saw your caregiver.  You have any other emergent concerns regarding your health.  Additional Information: Chest pain comes from many different causes. Your caregiver has diagnosed you as having chest pain that is not specific for one problem, but does not require admission.  You are at low risk for an acute heart condition or other serious illness.   Your vital signs today were: BP 114/82   Pulse 90   Temp 97.8 F (36.6 C)   Resp 18   Ht 5' 1 (1.549 m)   Wt 83.9 kg   LMP 05/12/2011   SpO2 98%   BMI 34.96 kg/m  If your blood pressure (BP) was elevated above  135/85 this visit, please have this repeated by your doctor within one month. --------------

## 2023-02-18 DIAGNOSIS — I480 Paroxysmal atrial fibrillation: Secondary | ICD-10-CM

## 2023-02-28 ENCOUNTER — Other Ambulatory Visit: Payer: Self-pay | Admitting: Family Medicine

## 2023-02-28 DIAGNOSIS — E1169 Type 2 diabetes mellitus with other specified complication: Secondary | ICD-10-CM

## 2023-03-07 ENCOUNTER — Encounter: Payer: Self-pay | Admitting: Family Medicine

## 2023-03-07 DIAGNOSIS — G8929 Other chronic pain: Secondary | ICD-10-CM

## 2023-03-07 DIAGNOSIS — K559 Vascular disorder of intestine, unspecified: Secondary | ICD-10-CM

## 2023-03-10 ENCOUNTER — Telehealth: Payer: Self-pay | Admitting: Gastroenterology

## 2023-03-10 NOTE — Telephone Encounter (Signed)
Request received to transfer GI care from outside practice to [facility].  We appreciate the interest in our practice, however at this time due to high demand from patients without established GI providers we cannot accommodate this transfer.      

## 2023-03-10 NOTE — Telephone Encounter (Signed)
Hi Dr. Lavon Paganini,  Supervising Provider: 03/10/23   We received a referral for patient to be evaluated for chronic abdominal pain. She does have GI history with [facility]. She is requesting a transfer of care because she is not happy. Records are available within Epic for you to review and advise on scheduling.    Thank you

## 2023-03-17 ENCOUNTER — Encounter: Payer: Self-pay | Admitting: Cardiovascular Disease

## 2023-03-17 ENCOUNTER — Ambulatory Visit: Payer: Federal, State, Local not specified - PPO | Attending: Cardiovascular Disease | Admitting: Cardiovascular Disease

## 2023-03-17 VITALS — BP 120/70 | HR 60 | Ht 61.0 in | Wt 189.6 lb

## 2023-03-17 DIAGNOSIS — I480 Paroxysmal atrial fibrillation: Secondary | ICD-10-CM | POA: Diagnosis not present

## 2023-03-17 NOTE — Progress Notes (Signed)
Electrophysiology Office Note:    Date:  03/17/2023   ID:  Morgan [name], DOB 02/26/1965, MRN [id number]  PCP:  [name], PA   [facility] Providers Cardiologist:  None Electrophysiologist:  Maurice Small, MD     Referring MD: [name], PA   History of Present Illness:    Morgan [name] is a 58 y.o. female with a medical history significant for HTN atrial flutter and fibrillation DM, referred for management of atrial fibrillation flutter.     She is a former patient of Dr. Johney Frame.  ZIO monitor from May 2022 showed atrial fibrillation with a burden of less than 1% and a more regular narrow complex tachycardia thought to be atrial flutter.  There were also episodes of PVCs and bigeminy.  Her symptom episodes did not correlate with atrial fibrillation, however.  She was managed with diltiazem but this was discontinued in March 2024 due to hypotension and bradycardia.  She was on nebivolol but readmitted with A-fib with RVR.  She had increasingly frequent episodes of palpitations.  A monitor was placed that showed sinus rhythm and symptom episodes that correlated with sinus rhythm.  She presented to the ER in January 2 with headache and palpitations.  She had missed her nebivolol and Eliquis that a.m. ECG showed atrial fibrillation with RVR.  She spontaneously converted to sinus rhythm during the ER stay.      Today, she reports that she is well currently.    EKGs/Labs/Other Studies Reviewed Today:     Echocardiogram:  TTE 04/28/2022 EF 60-65%. Normal biatrial size.   Monitors:  Zio monitor 13 days 07/2022 -- my interpretation Sinus rhythm heart rate 65-152 beats minute, average 100 bpm Less than 1% burden of atrial fibrillation with average rate of 144 bpm Episodes labeled SVT appear to be flutter or a very rapid atrial tachycardia Multiple symptom episodes correlated with sinus tachycardia, sinus rhythm PVCs and bigeminy.  The AF episode was  not correlated with symptoms.  Zio monitor 14 days -- 03/14/2022 Sinus rhythm: HR 45 - 122, average 69 bpm. No atrial fibrillation detected. Rare supraventricular ectopy. Rare ventricular ectopy. No sustained arrhythmias. Symptom trigger episodes correspond to sinus rhythm    EKG:   EKG Interpretation Date/Time:  Thursday March 17 2023 09:22:59 EST Ventricular Rate:  60 PR Interval:  172 QRS Duration:  84 QT Interval:  406 QTC Calculation: 406 R Axis:   64  Text Interpretation: Normal sinus rhythm Normal ECG When compared with ECG of 17-Feb-2023 17:09, No significant change was found Confirmed by York Pellant [phone]) on 03/17/2023 9:43:38 AM     Physical Exam:    VS:  BP 120/70 (BP Location: Left Arm, Patient Position: Sitting, Cuff Size: Large)   Pulse 60   Ht 5\' 1"  (1.549 m)   Wt 189 lb 9.6 oz (86 kg)   LMP 05/12/2011   SpO2 96%   BMI 35.82 kg/m     Wt Readings from Last 3 Encounters:  03/17/23 189 lb 9.6 oz (86 kg)  02/17/23 185 lb (83.9 kg)  02/14/23 189 lb 6.4 oz (85.9 kg)     GEN: Well nourished, well developed in no acute distress CARDIAC: RRR, no murmurs, rubs, gallops RESPIRATORY:  Normal work of breathing MUSCULOSKELETAL: no edema    ASSESSMENT & PLAN:     Palpitations She describes recent episodes as forceful beats, sometimes with slow heart rate These correlated with sinus rhythm on her monitor  Paroxysmal atrial  fibrillation History of RVR and difficult to control rates Chronic use of Abilify limits and treatment choices ER admit January 2 with AF/RVR; We discussed management options. Using a shared decision approach, we decided to proceed with ablation.  We discussed the indication, rationale, logistics, anticipated benefits, and potential risks of the ablation procedure including but not limited to -- bleed at the groin access site, chest pain, damage to nearby organs such as the diaphragm, lungs, or esophagus, need for a drainage  tube, or prolonged hospitalization. I explained that the risk for stroke, heart attack, need for open chest surgery, or even death is very low but not zero. she  expressed understanding and wishes to proceed.   Secondary hypercoagulable state Continue apixaban 5 mg twice daily     Signed, Maurice Small, MD  03/17/2023 9:44 AM    [facility]

## 2023-03-17 NOTE — Patient Instructions (Signed)
Medication Instructions:  Your physician recommends that you continue on your current medications as directed. Please refer to the Current Medication list given to you today. *If you need a refill on your cardiac medications before your next appointment, please call your pharmacy*   Lab Work: CBC and BMET on Monday, March 3 - this can be completed at ANY [facility] - no appointment required and this does not have to be fasting If you have labs (blood work) drawn today and your tests are completely normal, you will receive your results only by: MyChart Message (if you have MyChart) OR A paper copy in the mail If you have any lab test that is abnormal or we need to change your treatment, we will call you to review the results.   Testing/Procedures: Cardiac CT - someone will contact you to schedule this  Your physician has requested that you have cardiac CT. Cardiac computed tomography (CT) is a painless test that uses an x-ray machine to take clear, detailed pictures of your heart. For further information please visit [email or web address] Please follow instruction sheet as given.  Atrial Fibrillation Ablation - scheduled for Monday, March 31 Your physician has recommended that you have an ablation. Catheter ablation is a medical procedure used to treat some cardiac arrhythmias (irregular heartbeats). During catheter ablation, a long, thin, flexible tube is put into a blood vessel in your groin (upper thigh), or neck. This tube is called an ablation catheter. It is then guided to your heart through the blood vessel. Radio frequency waves destroy small areas of heart tissue where abnormal heartbeats may cause an arrhythmia to start. Please see the instruction sheet given to you today.   Follow-Up: At [facility], you and your health needs are our priority.  As part of our continuing mission to provide you with exceptional heart care, we have created designated Provider Care Teams.  These  Care Teams include your primary Cardiologist (physician) and Advanced Practice Providers (APPs -  Physician Assistants and Nurse Practitioners) who all work together to provide you with the care you need, when you need it.  We recommend signing up for the patient portal called "MyChart".  Sign up information is provided on this After Visit Summary.  MyChart is used to connect with patients for Virtual Visits (Telemedicine).  Patients are able to view lab/test results, encounter notes, upcoming appointments, etc.  Non-urgent messages can be sent to your provider as well.   To learn more about what you can do with MyChart, go to [email or web address].    Your next appointment:   We will schedule follow up after your ablation    Provider:   York Pellant, MD       1st Floor: - Lobby - Registration  - Pharmacy  - Lab - Cafe  2nd Floor: - PV Lab - Diagnostic Testing (echo, CT, nuclear med)  3rd Floor: - Vacant  4th Floor: - TCTS (cardiothoracic surgery) - AFib Clinic - Structural Heart Clinic - Vascular Surgery  - Vascular Ultrasound  5th Floor: - HeartCare Cardiology (general and EP) - Clinical Pharmacy for coumadin, hypertension, lipid, weight-loss medications, and med management appointments    Valet parking services will be available as well.

## 2023-03-17 NOTE — Addendum Note (Signed)
Addended by: Sherle Poe R on: 03/17/2023 10:04 AM   Modules accepted: Orders

## 2023-03-24 ENCOUNTER — Encounter: Payer: Self-pay | Admitting: Family Medicine

## 2023-03-30 ENCOUNTER — Other Ambulatory Visit: Payer: Federal, State, Local not specified - PPO

## 2023-03-30 DIAGNOSIS — Z794 Long term (current) use of insulin: Secondary | ICD-10-CM

## 2023-03-30 DIAGNOSIS — Z1159 Encounter for screening for other viral diseases: Secondary | ICD-10-CM

## 2023-03-30 DIAGNOSIS — E1169 Type 2 diabetes mellitus with other specified complication: Secondary | ICD-10-CM

## 2023-03-30 DIAGNOSIS — I10 Essential (primary) hypertension: Secondary | ICD-10-CM

## 2023-03-31 ENCOUNTER — Other Ambulatory Visit: Payer: Self-pay

## 2023-03-31 ENCOUNTER — Encounter (HOSPITAL_COMMUNITY): Payer: Self-pay | Admitting: Emergency Medicine

## 2023-03-31 ENCOUNTER — Emergency Department (HOSPITAL_COMMUNITY)
Admission: EM | Admit: 2023-03-31 | Discharge: 2023-03-31 | Disposition: A | Discharge status: Home / Self Care | Payer: Federal, State, Local not specified - PPO | Attending: Emergency Medicine | Admitting: Emergency Medicine

## 2023-03-31 ENCOUNTER — Encounter: Payer: Self-pay | Admitting: Family Medicine

## 2023-03-31 ENCOUNTER — Emergency Department (HOSPITAL_COMMUNITY): Payer: Federal, State, Local not specified - PPO

## 2023-03-31 DIAGNOSIS — Z8543 Personal history of malignant neoplasm of ovary: Secondary | ICD-10-CM | POA: Insufficient documentation

## 2023-03-31 DIAGNOSIS — R102 Pelvic and perineal pain: Secondary | ICD-10-CM | POA: Diagnosis not present

## 2023-03-31 DIAGNOSIS — Z7901 Long term (current) use of anticoagulants: Secondary | ICD-10-CM | POA: Insufficient documentation

## 2023-03-31 DIAGNOSIS — R1032 Left lower quadrant pain: Secondary | ICD-10-CM | POA: Diagnosis present

## 2023-03-31 DIAGNOSIS — R1030 Lower abdominal pain, unspecified: Secondary | ICD-10-CM

## 2023-03-31 LAB — URINALYSIS, ROUTINE W REFLEX MICROSCOPIC
Bilirubin Urine: NEGATIVE
Glucose, UA: 50 mg/dL — AB
Hgb urine dipstick: NEGATIVE
Ketones, ur: NEGATIVE mg/dL
Leukocytes,Ua: NEGATIVE
Nitrite: NEGATIVE
Protein, ur: NEGATIVE mg/dL
Specific Gravity, Urine: 1.01 (ref 1.005–1.030)
pH: 5 (ref 5.0–8.0)

## 2023-03-31 LAB — CBC WITH DIFFERENTIAL/PLATELET
Abs Immature Granulocytes: 0.03 10*3/uL (ref 0.00–0.07)
Basophils Absolute: 0 10*3/uL (ref 0.0–0.2)
Basophils Absolute: 0.1 10*3/uL (ref 0.0–0.1)
Basophils Relative: 1 %
Basos: 1 %
EOS (ABSOLUTE): 0.2 10*3/uL (ref 0.0–0.4)
Eos: 3 %
Eosinophils Absolute: 0.2 10*3/uL (ref 0.0–0.5)
Eosinophils Relative: 2 %
HCT: 42.7 % (ref 36.0–46.0)
Hematocrit: 41.4 % (ref 34.0–46.6)
Hemoglobin: 13.4 g/dL (ref 11.1–15.9)
Hemoglobin: 13.9 g/dL (ref 12.0–15.0)
Immature Grans (Abs): 0 10*3/uL (ref 0.0–0.1)
Immature Granulocytes: 0 %
Immature Granulocytes: 0 %
Lymphocytes Absolute: 2 10*3/uL (ref 0.7–3.1)
Lymphocytes Relative: 23 %
Lymphs Abs: 1.7 10*3/uL (ref 0.7–4.0)
Lymphs: 31 %
MCH: 26.9 pg (ref 26.6–33.0)
MCH: 26.7 pg (ref 26.0–34.0)
MCHC: 32.4 g/dL (ref 31.5–35.7)
MCHC: 32.6 g/dL (ref 30.0–36.0)
MCV: 83 fL (ref 79–97)
MCV: 82 fL (ref 80.0–100.0)
Monocytes Absolute: 0.2 10*3/uL (ref 0.1–0.9)
Monocytes Absolute: 0.2 10*3/uL (ref 0.1–1.0)
Monocytes Relative: 3 %
Monocytes: 3 %
Neutro Abs: 5.1 10*3/uL (ref 1.7–7.7)
Neutrophils Absolute: 4 10*3/uL (ref 1.4–7.0)
Neutrophils Relative %: 71 %
Neutrophils: 62 %
Platelets: 191 10*3/uL (ref 150–450)
Platelets: 186 10*3/uL (ref 150–400)
RBC: 4.99 x10E6/uL (ref 3.77–5.28)
RBC: 5.21 MIL/uL — ABNORMAL HIGH (ref 3.87–5.11)
RDW: 14.1 % (ref 11.7–15.4)
RDW: 13.7 % (ref 11.5–15.5)
WBC: 6.4 10*3/uL (ref 3.4–10.8)
WBC: 7.3 10*3/uL (ref 4.0–10.5)
nRBC: 0 % (ref 0.0–0.2)

## 2023-03-31 LAB — COMPREHENSIVE METABOLIC PANEL
ALT: 24 [IU]/L (ref 0–32)
ALT: 34 U/L (ref 0–44)
AST: 30 [IU]/L (ref 0–40)
AST: 49 U/L — ABNORMAL HIGH (ref 15–41)
Albumin: 4 g/dL (ref 3.8–4.9)
Albumin: 3.8 g/dL (ref 3.5–5.0)
Alkaline Phosphatase: 127 [IU]/L — ABNORMAL HIGH (ref 44–121)
Alkaline Phosphatase: 98 U/L (ref 38–126)
Anion gap: 11 (ref 5–15)
BUN/Creatinine Ratio: 15 (ref 9–23)
BUN: 9 mg/dL (ref 6–24)
BUN: 6 mg/dL (ref 6–20)
Bilirubin Total: 0.5 mg/dL (ref 0.0–1.2)
CO2: 25 mmol/L (ref 20–29)
CO2: 27 mmol/L (ref 22–32)
Calcium: 9.8 mg/dL (ref 8.7–10.2)
Calcium: 9.9 mg/dL (ref 8.9–10.3)
Chloride: 98 mmol/L (ref 96–106)
Chloride: 98 mmol/L (ref 98–111)
Creatinine, Ser: 0.6 mg/dL (ref 0.57–1.00)
Creatinine, Ser: 0.65 mg/dL (ref 0.44–1.00)
GFR, Estimated: >60 mL/min (ref >60)
Globulin, Total: 2.7 g/dL (ref 1.5–4.5)
Glucose, Bld: 157 mg/dL — ABNORMAL HIGH (ref 70–99)
Glucose: 160 mg/dL — ABNORMAL HIGH (ref 70–99)
Potassium: 3.6 mmol/L (ref 3.5–5.2)
Potassium: 3.4 mmol/L — ABNORMAL LOW (ref 3.5–5.1)
Sodium: 137 mmol/L (ref 134–144)
Sodium: 136 mmol/L (ref 135–145)
Total Bilirubin: 1 mg/dL (ref 0.0–1.2)
Total Protein: 6.7 g/dL (ref 6.0–8.5)
Total Protein: 7.5 g/dL (ref 6.5–8.1)
eGFR: 106 mL/min/{1.73_m2} (ref >59)

## 2023-03-31 LAB — LIPID PANEL
Chol/HDL Ratio: 3.7 {ratio} (ref 0.0–4.4)
Cholesterol, Total: 139 mg/dL (ref 100–199)
HDL: 38 mg/dL — ABNORMAL LOW (ref >39)
LDL Chol Calc (NIH): 64 mg/dL (ref 0–99)
Triglycerides: 224 mg/dL — ABNORMAL HIGH (ref 0–149)
VLDL Cholesterol Cal: 37 mg/dL (ref 5–40)

## 2023-03-31 LAB — HEMOGLOBIN A1C
Est. average glucose Bld gHb Est-mCnc: 189 mg/dL
Hgb A1c MFr Bld: 8.2 % — ABNORMAL HIGH (ref 4.8–5.6)

## 2023-03-31 LAB — I-STAT CG4 LACTIC ACID, ED: Lactic Acid, Venous: 1.5 mmol/L (ref 0.5–1.9)

## 2023-03-31 LAB — HEPATITIS C ANTIBODY: Hep C Virus Ab: NONREACTIVE

## 2023-03-31 MED ORDER — HYDROMORPHONE HCL 1 MG/ML IJ SOLN
0.5000 mg | Freq: Once | INTRAMUSCULAR | Status: AC
  Administered 2023-03-31: 0.5 mg via INTRAVENOUS
  Filled 2023-03-31: qty 1

## 2023-03-31 MED ORDER — ONDANSETRON HCL 4 MG/2ML IJ SOLN
4.0000 mg | Freq: Once | INTRAMUSCULAR | Status: AC
  Administered 2023-03-31: 4 mg via INTRAVENOUS
  Filled 2023-03-31: qty 2

## 2023-03-31 MED ORDER — IOHEXOL 350 MG/ML SOLN
75.0000 mL | Freq: Once | INTRAVENOUS | Status: AC | PRN
  Administered 2023-03-31: 75 mL via INTRAVENOUS

## 2023-03-31 NOTE — Discharge Instructions (Addendum)
Your CT scan was overall fine but did show some nonobstructing kidney stones that may be causing some of your symptoms Your labs overall look normal Please follow-up with your PCP in the next few days

## 2023-03-31 NOTE — ED Notes (Signed)
Patient transported to CT

## 2023-03-31 NOTE — ED Notes (Signed)
At roughly 0600 on 03/30/23 pt was awaken by sharp LLQ pain rated 8/10. Pt explains that the pain had remained constant since onset. Pain does not radiate to any other location.

## 2023-03-31 NOTE — ED Triage Notes (Signed)
Pt. Stated Ive had pelvic pain and back pain since yesterday. I do have some nausea

## 2023-03-31 NOTE — ED Provider Notes (Signed)
[place] EMERGENCY DEPARTMENT AT [facility] Provider Note   CSN: [id number] Arrival date & time: 03/31/23  4098     History  Chief Complaint  Patient presents with   Pelvic Pain   Back Pain   Nausea    Morgan [name] is a 58 y.o. female PMH bipolar disorder, cervical dysplasia, endometrial hyperplasia, PAF on Eliquis, single left pelvic kidney, history of total abdominal hysterectomy, history of ovarian cancer in remission, GERD, HLD, ishemic colitis   Pelvic Pain Associated symptoms include abdominal pain. Pertinent negatives include no chest pain and no shortness of breath.  Back Pain Associated symptoms: abdominal pain and pelvic pain   Associated symptoms: no chest pain, no dysuria and no fever    Presents with sharp, constant left lower quadrant pain that began yesterday morning. The pain is unrelieved by Tylenol and oxycodone, which she takes as needed for headaches.  She also reports back pain and nausea, but denies vomiting, diarrhea, constipation, dysuria, urinary frequency, and urgency.  She had a normal bowel movement yesterday that did not alleviate the pain.  She denies any recent heavy lifting or other potential causes of musculoskeletal pain.  She has been in remission from ovarian cancer, with regular follow-ups every six months, and her last check-up in November showed normal blood work and no concerning findings on physical examination per patient     Home Medications Prior to Admission medications   Medication Sig Start Date End Date Taking? Authorizing Provider  acetaminophen (TYLENOL) 500 MG tablet Take 500 mg by mouth every 6 (six) hours as needed for mild pain.    [provider]  apixaban (ELIQUIS) 5 MG TABS tablet TAKE 1 TABLET BY MOUTH TWICE A DAY 11/01/22   Corky Crafts, MD  ARIPiprazole (ABILIFY) 15 MG tablet Take 15 mg by mouth at bedtime. 12/21/17   Ellis Savage, NP  budesonide-formoterol (SYMBICORT) 80-4.5 MCG/ACT  inhaler Inhale 2 puffs into the lungs in the morning and at bedtime. 01/03/23   Assaker, [place] Bali, MD  clotrimazole (LOTRIMIN) 1 % cream Apply to affected area 2 times daily 08/19/22   Gustavus Bryant, FNP  cyclobenzaprine (FLEXERIL) 5 MG tablet Take 1 tablet (5 mg total) by mouth 2 (two) times daily as needed for muscle spasms. 09/07/22   Small, Brooke L, PA  Dapagliflozin Pro-metFORMIN ER (XIGDUO XR) 11-998 MG TB24 Take 1 tablet by mouth at bedtime. 06/23/22   [name], PA  DULoxetine (CYMBALTA) 60 MG capsule Take 60 mg by mouth at bedtime. 11/04/16   [provider]  fluconazole (DIFLUCAN) 150 MG tablet Take 1 tablet (150 mg total) by mouth daily. Take 1 tablet now and repeat in 3 days if symptoms persist 12/13/22   Doristine Mango A, PA-C  fluticasone (FLONASE) 50 MCG/ACT nasal spray Place 1 spray into both nostrils daily as needed for allergies.    [provider]  Insulin Aspart FlexPen (NOVOLOG) 100 UNIT/ML INJECT 10 UNITS INTO THE SKIN BEFORE BREAKFAST AND LUNCH. INJECT 13 UNITS INTO THE SKIN BEFORE SUPPER. 02/28/23   Saralyn Pilar A, PA  lidocaine (LIDODERM) 5 % Place 1 patch onto the skin daily as needed (pain).    [provider]  nebivolol (BYSTOLIC) 2.5 MG tablet Take 1 tablet (2.5 mg total) by mouth at bedtime. 10/26/22   Swinyer, Zachary George, NP  nystatin cream (MYCOSTATIN) Apply to affected area 2 times daily 12/13/22   Doristine Mango A, PA-C  omeprazole (PRILOSEC OTC) 20 MG tablet  Take 20 mg by mouth at bedtime.    [provider]  ondansetron (ZOFRAN-ODT) 4 MG disintegrating tablet Take 1 tablet (4 mg total) by mouth every 8 (eight) hours as needed. 09/30/22   Sandre Kitty, MD  oxycodone (OXY-IR) 5 MG capsule Take 5 mg by mouth every 4 (four) hours as needed for pain.    [provider]  rosuvastatin (CRESTOR) 10 MG tablet Take 1 tablet (10 mg total) by mouth daily. 04/08/22   Corky Crafts, MD  tiZANidine (ZANAFLEX) 4 MG  tablet Take 4 mg by mouth at bedtime as needed for muscle spasms. 05/14/21   [provider]      Allergies    Clindamycin/lincomycin, Lamictal [lamotrigine], Banana, Lipitor [atorvastatin], and Pineapple    Review of Systems   Review of Systems  Constitutional:  Negative for chills and fever.  HENT:  Negative for ear pain and sore throat.   Eyes:  Negative for pain and visual disturbance.  Respiratory:  Negative for cough and shortness of breath.   Cardiovascular:  Negative for chest pain and palpitations.  Gastrointestinal:  Positive for abdominal pain and nausea. Negative for vomiting.  Genitourinary:  Positive for pelvic pain. Negative for dysuria and hematuria.  Musculoskeletal:  Positive for back pain. Negative for arthralgias.  Skin:  Negative for color change and rash.  Neurological:  Negative for seizures and syncope.  All other systems reviewed and are negative.   Physical Exam Updated Vital Signs BP 129/71   Pulse 69   Temp 98.3 F (36.8 C) (Oral)   Resp 12   LMP 05/12/2011   SpO2 97%  Physical Exam Vitals and nursing note reviewed.  Constitutional:      General: She is not in acute distress.    Appearance: She is well-developed.  HENT:     Head: Normocephalic and atraumatic.  Eyes:     Conjunctiva/sclera: Conjunctivae normal.  Cardiovascular:     Rate and Rhythm: Normal rate and regular rhythm.     Heart sounds: No murmur heard. Pulmonary:     Effort: Pulmonary effort is normal. No respiratory distress.     Breath sounds: Normal breath sounds.  Abdominal:     Palpations: Abdomen is soft.     Tenderness: There is abdominal tenderness. There is no right CVA tenderness, left CVA tenderness, guarding or rebound.     Hernia: No hernia is present.     Comments: Mild left lower quadrant tenderness, no skin lesions  Musculoskeletal:        General: No swelling.     Cervical back: Neck supple.  Skin:    General: Skin is warm and dry.     Capillary  Refill: Capillary refill takes less than 2 seconds.  Neurological:     Mental Status: She is alert.  Psychiatric:        Mood and Affect: Mood normal.     ED Results / Procedures / Treatments   Labs (all labs ordered are listed, but only abnormal results are displayed) Labs Reviewed  URINALYSIS, ROUTINE W REFLEX MICROSCOPIC - Abnormal; Notable for the following components:      Result Value   Glucose, UA 50 (*)    All other components within normal limits  COMPREHENSIVE METABOLIC PANEL - Abnormal; Notable for the following components:   Potassium 3.4 (*)    Glucose, Bld 157 (*)    AST 49 (*)    All other components within normal limits  CBC WITH DIFFERENTIAL/PLATELET -  Abnormal; Notable for the following components:   RBC 5.21 (*)    All other components within normal limits  I-STAT CG4 LACTIC ACID, ED    EKG None  Radiology CT ABDOMEN PELVIS W CONTRAST Result Date: 03/31/2023 CLINICAL DATA:  Left lower quadrant abdominal pain. EXAM: CT ABDOMEN AND PELVIS WITH CONTRAST TECHNIQUE: Multidetector CT imaging of the abdomen and pelvis was performed using the standard protocol following bolus administration of intravenous contrast. RADIATION DOSE REDUCTION: This exam was performed according to the departmental dose-optimization program which includes automated exposure control, adjustment of the mA and/or kV according to patient size and/or use of iterative reconstruction technique. CONTRAST:  75mL OMNIPAQUE IOHEXOL 350 MG/ML SOLN COMPARISON:  09/07/2022 FINDINGS: Lower chest: Scarring in the right lower lobe. No acute abnormality. Hepatobiliary: Unchanged flash filling hemangioma in the posterior right hepatic lobe. Hepatic steatosis. Gallbladder and biliary tree are unremarkable. Pancreas: Unremarkable. Spleen: Unremarkable. Adrenals/Urinary Tract: Normal adrenal glands. 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 Punctate nonobstructing stones in the left  kidney. No hydronephrosis. Unremarkable bladder. Stomach/Bowel: Stomach is within normal limits. No bowel obstruction or bowel wall thickening. Normal appendix. Vascular/Lymphatic: No significant vascular findings are present. No enlarged abdominal or pelvic lymph nodes. Reproductive: Hysterectomy.  No acute abnormality. Other: No free intraperitoneal fluid or air. Musculoskeletal: Posterior fusion L4-L5. Unchanged sclerosis in the superior L2 vertebral body. IMPRESSION: 1. No acute abnormality in the abdomen or pelvis. 2. 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 3. Hepatic steatosis. Electronically Signed   By: Minerva Fester M.D.   On: 03/31/2023 20:17    Procedures Procedures   Medications Ordered in ED Medications  HYDROmorphone (DILAUDID) injection 0.5 mg (0.5 mg Intravenous Given 03/31/23 1735)  ondansetron (ZOFRAN) injection 4 mg (4 mg Intravenous Given 03/31/23 1734)  iohexol (OMNIPAQUE) 350 MG/ML injection 75 mL (75 mLs Intravenous Contrast Given 03/31/23 2005)  HYDROmorphone (DILAUDID) injection 0.5 mg (0.5 mg Intravenous Given 03/31/23 2056)    ED Course/ Medical Decision Making/ A&P                                Medical Decision Making Amount and/or Complexity of Data Reviewed Labs: ordered. Radiology: ordered.  Risk Prescription drug management.   Medical Decision Making:   SABINA BEAVERS is a 58 y.o. female who presented to the ED today with left lower quadrant abdominal pain detailed above.    Additional history discussed with patient's family/caregivers.  External chart has been reviewed including PCP/cardiology notes. Patient's presentation is complicated by their history of ischemic colitis, ovarian cancer, hysterectomy.  Complete initial physical exam performed, notably the patient  was mildly tender to palpation in left lower quadrant.    Reviewed and confirmed nursing documentation for past medical history, family history, social  history.    Initial Assessment:   With the patient's presentation of abdominal/pelvic pain, most likely diagnosis is possible muscular versus fibrotic tissue from past surgery. Other diagnoses were considered including (but not limited to) ischemic colitis, diverticulitis, SBO, kidney stone, UTI/pyelonephritis. These are considered less likely due to history of present illness and physical exam findings.    Initial Plan:  Lactic acid Screening labs including CBC and Metabolic panel to evaluate for infectious or metabolic etiology of disease.  Urinalysis with reflex culture ordered to evaluate for UTI or relevant urologic/nephrologic pathology.  EKG to evaluate for cardiac pathology Objective evaluation as below reviewed  Initial Study Results:   Laboratory  All laboratory results reviewed without evidence of clinically relevant pathology.   Exceptions include: mildly elevated AST 49  EKG EKG was reviewed independently. Rate, rhythm, axis, intervals all examined and without medically relevant abnormality. ST segments without concerns for elevations.    Radiology:  All images reviewed independently. Agree with radiology report at this time.   CT ABDOMEN PELVIS W CONTRAST Result Date: 03/31/2023 CLINICAL DATA:  Left lower quadrant abdominal pain. EXAM: CT ABDOMEN AND PELVIS WITH CONTRAST TECHNIQUE: Multidetector CT imaging of the abdomen and pelvis was performed using the standard protocol following bolus administration of intravenous contrast. RADIATION DOSE REDUCTION: This exam was performed according to the departmental dose-optimization program which includes automated exposure control, adjustment of the mA and/or kV according to patient size and/or use of iterative reconstruction technique. CONTRAST:  75mL OMNIPAQUE IOHEXOL 350 MG/ML SOLN COMPARISON:  09/07/2022 FINDINGS: Lower chest: Scarring in the right lower lobe. No acute abnormality. Hepatobiliary: Unchanged flash filling hemangioma  in the posterior right hepatic lobe. Hepatic steatosis. Gallbladder and biliary tree are unremarkable. Pancreas: Unremarkable. Spleen: Unremarkable. Adrenals/Urinary Tract: Normal adrenal glands. 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 Punctate nonobstructing stones in the left kidney. No hydronephrosis. Unremarkable bladder. Stomach/Bowel: Stomach is within normal limits. No bowel obstruction or bowel wall thickening. Normal appendix. Vascular/Lymphatic: No significant vascular findings are present. No enlarged abdominal or pelvic lymph nodes. Reproductive: Hysterectomy.  No acute abnormality. Other: No free intraperitoneal fluid or air. Musculoskeletal: Posterior fusion L4-L5. Unchanged sclerosis in the superior L2 vertebral body. IMPRESSION: 1. No acute abnormality in the abdomen or pelvis. 2. 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 3. Hepatic steatosis. Electronically Signed   By: Minerva Fester M.D.   On: 03/31/2023 20:17   LONG TERM MONITOR (3-14 DAYS) Result Date: 03/15/2023 14 day monitor Sinus rhythm: HR 45 - 122, average 69 bpm. No atrial fibrillation detected. Rare supraventricular ectopy. Rare ventricular ectopy. No sustained arrhythmias. Symptom trigger episodes correspond to sinus rhythm [facility]    Final Assessment and Plan:   Lower abdominal pain left sided-nonobstructing kidney stone although no blood in urine.  CT without significant abnormalities.  Patient pain was controlled and she felt comfortable discharging home with  close follow-up with PCP and oncology.   Clinical Impression:  1. Lower abdominal pain      Discharge     Final Clinical Impression(s) / ED Diagnoses Final diagnoses:  Lower abdominal pain    Rx / DC Orders ED Discharge Orders     None         Levin Erp, MD 03/31/23 2215    Tegeler, Canary Brim, MD 03/31/23 [phone]

## 2023-04-05 ENCOUNTER — Ambulatory Visit
Admission: EM | Admit: 2023-04-05 | Discharge: 2023-04-05 | Disposition: A | Discharge status: Home / Self Care | Payer: Federal, State, Local not specified - PPO | Attending: Family Medicine | Admitting: Family Medicine

## 2023-04-05 DIAGNOSIS — J45901 Unspecified asthma with (acute) exacerbation: Secondary | ICD-10-CM | POA: Diagnosis not present

## 2023-04-05 DIAGNOSIS — J111 Influenza due to unidentified influenza virus with other respiratory manifestations: Secondary | ICD-10-CM | POA: Diagnosis not present

## 2023-04-05 LAB — POC COVID19/FLU A&B COMBO
Covid Antigen, POC: NEGATIVE
Influenza A Antigen, POC: POSITIVE — AB
Influenza B Antigen, POC: NEGATIVE

## 2023-04-05 MED ORDER — PREDNISONE 20 MG PO TABS: 40.0000 mg | ORAL_TABLET | Freq: Every day | ORAL | 0 refills | Status: DC

## 2023-04-05 MED ORDER — ALBUTEROL SULFATE (2.5 MG/3ML) 0.083% IN NEBU
2.5000 mg | INHALATION_SOLUTION | Freq: Once | RESPIRATORY_TRACT | Status: AC
  Administered 2023-04-05: 2.5 mg via RESPIRATORY_TRACT

## 2023-04-05 MED ORDER — AZITHROMYCIN 250 MG PO TABS: ORAL_TABLET | ORAL | 0 refills | Status: DC

## 2023-04-05 MED ORDER — IPRATROPIUM-ALBUTEROL 0.5-2.5 (3) MG/3ML IN SOLN
3.0000 mL | Freq: Once | RESPIRATORY_TRACT | Status: AC
  Administered 2023-04-05: 3 mL via RESPIRATORY_TRACT

## 2023-04-05 NOTE — ED Triage Notes (Signed)
Pt presents with flu like symptoms. Pt states she has had chills and cold sweats and lots of congestion in her chest. Symptoms onset 4 days ago. Pt wants to be tested for Flu.

## 2023-04-05 NOTE — Discharge Instructions (Signed)
As discussed pick up your Symbicort and steroid dose as soon as you pick it up.  You are having some significant wheezing which is likely secondary to an asthma exacerbation due to having the flu.  Also prescribed you prednisone take your first dose of 20 mg when you pick it up from the pharmacy and take 20 mg twice daily for 5 days. Treating you with  azithromycin, y prophylactic to prevent you from developing a pneumonia.  If you develop any worsening symptoms of difficulty breathing or severe chest tightness that does not improve with your inhaler go immediately to the emergency department.

## 2023-04-05 NOTE — ED Provider Notes (Signed)
Morgan [name]    CSN: [id number] Arrival date & time: 04/05/23  0803      History   Chief Complaint Chief Complaint  Patient presents with   Influenza    HPI Morgan [name] is a 58 y.o. female.  Patient here with 4 days with history of cough, congestion and body aches.  Patient reports she has had increased work of breathing over the last 2 days.  She was recently exposed to influenza as her father tested positive for flu and she has been caring for him.  Can over-the-counter Mucinex out improvement of symptoms.  Patient is unaware if she has had a fever feel as if she has had a fever. On arrival patient is wheezing oxygen level initially of 92%.  She denies any distress with breathing but endorses some mild shortness of breath.  Past Medical History:  Diagnosis Date   Anxiety    Atrial fibrillation (HCC)    Bipolar disorder (HCC)    Breast mass    right, removed and benign   Cancer (HCC) 09/16/2019   ovarian ca no radiation no chemo   Cervical dysplasia    Diabetes mellitus without complication (HCC)    Endometriosis    GERD (gastroesophageal reflux disease)    Hypercholesteremia    Hypertension    Migraine    Ovarian cancer (HCC)    Pelvic kidney    left   Postpartum depression    hx of    Patient Active Problem List   Diagnosis Date Noted   Chronic abdominal pain 01/04/2023   Single pelvic kidney (left) 09/09/2022   Pressure injury of skin 04/29/2022   Shock (HCC) 04/29/2022   Anticoagulated 03/02/2022   Ischemic colitis (HCC) 03/02/2022   Paroxysmal atrial fibrillation (HCC) 12/31/2020   Hypercoagulable state due to paroxysmal atrial fibrillation (HCC) 12/31/2020   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   History of endometrial hyperplasia 08/12/2019   Fatty liver 08/12/2019   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   Body mass index (BMI) 33.0-33.9, adult 05/11/2019   Cervical dysplasia 04/18/2019   Other chronic pain  03/16/2019   Lumbago with sciatica, right side 03/16/2019   Low back pain 03/16/2019   Type 2 diabetes mellitus with other specified complication (HCC) 06/14/2017   Hyperlipidemia associated with type 2 diabetes mellitus (HCC) 06/14/2017   Essential hypertension 06/14/2017   Displacement of lumbar intervertebral disc without myelopathy 09/20/2012   Bipolar disorder (HCC)     Past Surgical History:  Procedure Laterality Date   BACK SURGERY  02/16/2012   BREAST BIOPSY Right 2016   BREAST BIOPSY Right 2020   BREAST EXCISIONAL BIOPSY Right 2021   BREAST EXCISIONAL BIOPSY Right 2016   benign   BREAST LUMPECTOMY WITH RADIOACTIVE SEED LOCALIZATION Right 10/28/2014   Procedure: RIGHT BREAST LUMPECTOMY WITH RADIOACTIVE SEED LOCALIZATION;  Surgeon: Chevis Pretty III, MD;  Location: [facility];  Service: General;  Laterality: Right;   BREAST LUMPECTOMY WITH RADIOACTIVE SEED LOCALIZATION Right 06/18/2019   Procedure: RIGHT BREAST RADIOACTIVE SEED LOCALIZATION LUMPECTOMY;  Surgeon: Griselda Miner, MD;  Location: [facility];  Service: General;  Laterality: Right;   BREAST SURGERY N/A    Phreesia 07/30/2019   CESAREAN SECTION  02/15/2001   COLONOSCOPY WITH PROPOFOL N/A 05/01/2018   Procedure: COLONOSCOPY WITH PROPOFOL;  Surgeon: Wyline Mood, MD;  Location: [facility] ENDOSCOPY;  Service: Gastroenterology;  Laterality: N/A;   COLPOSCOPY     ESOPHAGOGASTRODUODENOSCOPY (EGD) WITH PROPOFOL N/A 05/01/2018  Procedure: ESOPHAGOGASTRODUODENOSCOPY (EGD) WITH PROPOFOL;  Surgeon: Wyline Mood, MD;  Location: [facility] ENDOSCOPY;  Service: Gastroenterology;  Laterality: N/A;   ESOPHAGOGASTRODUODENOSCOPY (EGD) WITH PROPOFOL N/A 11/13/2018   Procedure: ESOPHAGOGASTRODUODENOSCOPY (EGD) WITH PROPOFOL;  Surgeon: Wyline Mood, MD;  Location: [facility] ENDOSCOPY;  Service: Gastroenterology;  Laterality: N/A;   GYNECOLOGIC CRYOSURGERY     NECK SURGERY  02/15/2010   PELVIC LAPAROSCOPY  02/15/1989   TOTAL  ABDOMINAL HYSTERECTOMY N/A    Phreesia 07/30/2019    OB History     Gravida  1   Para  1   Term  1   Preterm      AB  0   Living  1      SAB      IAB      Ectopic  0   Multiple      Live Births               Home Medications    Prior to Admission medications   Medication Sig Start Date End Date Taking? Authorizing Provider  azithromycin (ZITHROMAX) 250 MG tablet Take 2 tabs PO x 1 dose, then 1 tab PO QD x 4 days 04/05/23  Yes Bing Neighbors, NP  predniSONE (DELTASONE) 20 MG tablet Take 2 tablets (40 mg total) by mouth daily with breakfast. 04/05/23  Yes Bing Neighbors, NP  REXULTI 2 MG TABS tablet Take 2 mg by mouth daily. 03/24/23  Yes [provider]  acetaminophen (TYLENOL) 500 MG tablet Take 500 mg by mouth every 6 (six) hours as needed for mild pain.    [provider]  apixaban (ELIQUIS) 5 MG TABS tablet TAKE 1 TABLET BY MOUTH TWICE A DAY 11/01/22   Corky Crafts, MD  ARIPiprazole (ABILIFY) 15 MG tablet Take 15 mg by mouth at bedtime. 12/21/17   Ellis Savage, NP  budesonide-formoterol (SYMBICORT) 80-4.5 MCG/ACT inhaler Inhale 2 puffs into the lungs in the morning and at bedtime. 01/03/23   Assaker, [place] Bali, MD  clotrimazole (LOTRIMIN) 1 % cream Apply to affected area 2 times daily 08/19/22   Gustavus Bryant, FNP  cyclobenzaprine (FLEXERIL) 5 MG tablet Take 1 tablet (5 mg total) by mouth 2 (two) times daily as needed for muscle spasms. 09/07/22   Small, Brooke L, PA  Dapagliflozin Pro-metFORMIN ER (XIGDUO XR) 11-998 MG TB24 Take 1 tablet by mouth at bedtime. 06/23/22   [name], PA  DULoxetine (CYMBALTA) 60 MG capsule Take 60 mg by mouth at bedtime. 11/04/16   [provider]  fluconazole (DIFLUCAN) 150 MG tablet Take 1 tablet (150 mg total) by mouth daily. Take 1 tablet now and repeat in 3 days if symptoms persist 12/13/22   Doristine Mango A, PA-C  fluticasone (FLONASE) 50 MCG/ACT nasal spray Place 1 spray into  both nostrils daily as needed for allergies.    [provider]  Insulin Aspart FlexPen (NOVOLOG) 100 UNIT/ML INJECT 10 UNITS INTO THE SKIN BEFORE BREAKFAST AND LUNCH. INJECT 13 UNITS INTO THE SKIN BEFORE SUPPER. 02/28/23   Saralyn Pilar A, PA  lidocaine (LIDODERM) 5 % Place 1 patch onto the skin daily as needed (pain).    [provider]  nebivolol (BYSTOLIC) 2.5 MG tablet Take 1 tablet (2.5 mg total) by mouth at bedtime. 10/26/22   Swinyer, Zachary George, NP  nystatin cream (MYCOSTATIN) Apply to affected area 2 times daily 12/13/22   Doristine Mango A, PA-C  omeprazole (PRILOSEC OTC) 20 MG tablet Take 20 mg by mouth  at bedtime.    [provider]  ondansetron (ZOFRAN-ODT) 4 MG disintegrating tablet Take 1 tablet (4 mg total) by mouth every 8 (eight) hours as needed. 09/30/22   Sandre Kitty, MD  oxycodone (OXY-IR) 5 MG capsule Take 5 mg by mouth every 4 (four) hours as needed for pain.    [provider]  rosuvastatin (CRESTOR) 10 MG tablet Take 1 tablet (10 mg total) by mouth daily. 04/08/22   Corky Crafts, MD  tiZANidine (ZANAFLEX) 4 MG tablet Take 4 mg by mouth at bedtime as needed for muscle spasms. 05/14/21   [provider]    Family History Family History  Problem Relation Age of Onset   Diabetes Mother    Breast cancer Mother 23   Thyroid disease Mother    Depression Mother    Hypertension Father    Heart disease Father    Heart attack Father 47       Two instances, first at age 56   Stroke Father    Alcohol abuse Father    Throat cancer Father    Diabetes Maternal Grandmother    Depression Maternal Grandmother    Uterine cancer Maternal Grandmother     Social History Social History   Tobacco Use   Smoking status: Never    Passive exposure: Never   Smokeless tobacco: Never   Tobacco comments:    Never smoke 07/01/21  Vaping Use   Vaping status: Never Used  Substance Use Topics   Alcohol use: No   Drug use: No      Allergies   Clindamycin/lincomycin, Lamictal [lamotrigine], Banana, Lipitor [atorvastatin], and Pineapple   Review of Systems Review of Systems Pertinent negatives listed in HPI   Physical Exam Triage Vital Signs ED Triage Vitals  Encounter Vitals Group     BP 04/05/23 0846 125/71     Systolic BP Percentile --      Diastolic BP Percentile --      Pulse Rate 04/05/23 0846 65     Resp 04/05/23 0846 16     Temp 04/05/23 0846 98.6 F (37 C)     Temp Source 04/05/23 0846 Oral     SpO2 04/05/23 0846 92 %     Weight --      Height --      Head Circumference --      Peak Flow --      Pain Score 04/05/23 0844 0     Pain Loc --      Pain Education --      Exclude from Growth Chart --    No data found.  Updated Vital Signs BP 125/71 (BP Location: Left Arm)   Pulse 87   Temp 98.6 F (37 C) (Oral)   Resp 16   LMP 05/12/2011   SpO2 99%   Visual Acuity Right Eye Distance:   Left Eye Distance:   Bilateral Distance:    Right Eye Near:   Left Eye Near:    Bilateral Near:     Physical Exam Vitals reviewed.  Constitutional:      Appearance: She is ill-appearing.  HENT:     Head: Normocephalic and atraumatic.     Right Ear: There is no impacted cerumen.     Left Ear: There is no impacted cerumen.     Nose: Congestion and rhinorrhea present.  Eyes:     Pupils: Pupils are equal, round, and reactive to light.  Cardiovascular:     Rate  and Rhythm: Normal rate and regular rhythm.  Pulmonary:     Breath sounds: Decreased air movement present. Wheezing and rhonchi present.  Musculoskeletal:     Cervical back: Normal range of motion and neck supple.  Lymphadenopathy:     Cervical: No cervical adenopathy.  Skin:    General: Skin is warm.  Neurological:     General: No focal deficit present.     Mental Status: She is alert.      UC Treatments / Results  Labs (all labs ordered are listed, but only abnormal results are displayed) Labs Reviewed  POC COVID19/FLU  A&B COMBO - Abnormal; Notable for the following components:      Result Value   Influenza A Antigen, POC Positive (*)    All other components within normal limits    EKG   Radiology No results found.  Procedures Procedures (including critical [name] time)  Medications Ordered in UC Medications  ipratropium-albuterol (DUONEB) 0.5-2.5 (3) MG/3ML nebulizer solution 3 mL (3 mLs Nebulization Given 04/05/23 0936)  albuterol (PROVENTIL) (2.5 MG/3ML) 0.083% nebulizer solution 2.5 mg (2.5 mg Nebulization Given 04/05/23 0936)    Initial Impression / Assessment and Plan / UC Course  I have reviewed the triage vital signs and the nursing notes.  Pertinent labs & imaging results that were available during my [name] of the patient were reviewed by me and considered in my medical decision making (see chart for details).    Influenza, respiratory manifestation with acute asthma exacerbation And given a DuoNeb with albuterol neb treatment here in clinic.  Oxygen level improved from 92% to 99%.  Patient continues to have some audible wheezing reinforced the importance of picking up Symbicort and administering twice daily until her symptoms resolve.  Prednisone 20 mg twice daily prescribed.  Azithromycin prescribed prophylactically as I am concerned that patient may be developing pneumonia given severity of current symptoms.  Encouraged to continue Mucinex for cough.  Strict ED precautions given if symptoms worsen or do not improve. Patient is outside of timeframe in which Tamiflu would be effective symptoms present for more than 48 hours. Final Clinical Impressions(s) / UC Diagnoses   Final diagnoses:  Influenza with respiratory manifestation  Asthma with acute exacerbation, unspecified asthma severity, unspecified whether persistent     Discharge Instructions      As discussed pick up your Symbicort and steroid dose as soon as you pick it up.  You are having some significant wheezing which is likely  secondary to an asthma exacerbation due to having the flu.  Also prescribed you prednisone take your first dose of 20 mg when you pick it up from the pharmacy and take 20 mg twice daily for 5 days. Treating you with  azithromycin, y prophylactic to prevent you from developing a pneumonia.  If you develop any worsening symptoms of difficulty breathing or severe chest tightness that does not improve with your inhaler go immediately to the emergency department.     ED Prescriptions     Medication Sig Dispense Auth. Provider   predniSONE (DELTASONE) 20 MG tablet Take 2 tablets (40 mg total) by mouth daily with breakfast. 10 tablet Bing Neighbors, NP   azithromycin (ZITHROMAX) 250 MG tablet Take 2 tabs PO x 1 dose, then 1 tab PO QD x 4 days 6 tablet Bing Neighbors, NP      PDMP not reviewed this encounter.   Bing Neighbors, NP 04/05/23 [phone]

## 2023-04-06 ENCOUNTER — Ambulatory Visit: Payer: Federal, State, Local not specified - PPO | Admitting: Family Medicine

## 2023-04-07 ENCOUNTER — Other Ambulatory Visit: Payer: Self-pay | Admitting: Family Medicine

## 2023-04-07 DIAGNOSIS — E1169 Type 2 diabetes mellitus with other specified complication: Secondary | ICD-10-CM

## 2023-04-08 ENCOUNTER — Ambulatory Visit: Payer: Self-pay

## 2023-04-08 ENCOUNTER — Ambulatory Visit: Payer: Federal, State, Local not specified - PPO | Admitting: Family Medicine

## 2023-04-08 ENCOUNTER — Encounter: Payer: Self-pay | Admitting: Family Medicine

## 2023-04-08 VITALS — BP 118/71 | HR 66 | Ht 61.0 in | Wt 188.9 lb

## 2023-04-08 DIAGNOSIS — R062 Wheezing: Secondary | ICD-10-CM | POA: Diagnosis not present

## 2023-04-08 MED ORDER — FULL KIT NEBULIZER SET MISC: 0 refills | Status: DC

## 2023-04-08 MED ORDER — ALBUTEROL SULFATE (2.5 MG/3ML) 0.083% IN NEBU: 2.5000 mg | INHALATION_SOLUTION | Freq: Four times a day (QID) | RESPIRATORY_TRACT | 1 refills | Status: DC | PRN

## 2023-04-08 MED ORDER — AMOXICILLIN-POT CLAVULANATE 875-125 MG PO TABS: 1.0000 | ORAL_TABLET | Freq: Two times a day (BID) | ORAL | 0 refills | Status: AC

## 2023-04-08 MED ORDER — ALBUTEROL SULFATE HFA 108 (90 BASE) MCG/ACT IN AERS
2.0000 | INHALATION_SPRAY | Freq: Four times a day (QID) | RESPIRATORY_TRACT | 0 refills | Status: DC | PRN
Start: 2023-04-08 — End: 2023-05-11

## 2023-04-08 MED ORDER — PREDNISONE 10 MG PO TABS: ORAL_TABLET | ORAL | 0 refills | Status: AC

## 2023-04-08 NOTE — Assessment & Plan Note (Signed)
Patient positive for influenza A.  This is day 6 of symptoms.  On exam she has significant wheezing, some tachypnea but O2 sat is 96%.  She does not having a history of asthma or COPD.  Currently taking Symbicort as needed, prednisone, azithromycin.  Recommended patient get a pulse oximeter and check her oxygen levels at home, and advised her to have someone take her to the emergency department if she becomes hypoxic - Adding additional prednisone for 5 additional days with a taper to start after she finishes her other dose tomorrow - Adding Augmentin. - Takes Symbicort twice a day, added albuterol to use as needed in between. - Prescribed nebulized albuterol but patient has not used this before and would need to get the nebulizer.

## 2023-04-08 NOTE — Telephone Encounter (Signed)
     Chief Complaint: Seen in UC this week, positive for flu. Still wheezing, coughing. Symptoms: Above Frequency: Last Saturday Pertinent Negatives: Patient denies fever Disposition: [] ED /[] Urgent Care (no appt availability in office) / [x] Appointment(In office/virtual)/ []  [facility]/ [] Home Care/ [] Refused Recommended Disposition /[] [facility]/ []  Follow-up with PCP Additional Notes: Agrees with appointment.  Reason for Disposition  [1] MILD difficulty breathing (e.g., minimal/no SOB at rest, SOB with walking, pulse <100) AND [2] NEW-onset or WORSE than normal  Answer Assessment - Initial Assessment Questions 1. RESPIRATORY STATUS: "Describe your breathing?" (e.g., wheezing, shortness of breath, unable to speak, severe coughing)      Wheezing 2. ONSET: "When did this breathing problem begin?"      Last Saturday 3. PATTERN "Does the difficult breathing come and go, or has it been constant since it started?"      Constant 4. SEVERITY: "How bad is your breathing?" (e.g., mild, moderate, severe)    - MILD: No SOB at rest, mild SOB with walking, speaks normally in sentences, can lie down, no retractions, pulse < 100.    - MODERATE: SOB at rest, SOB with minimal exertion and prefers to sit, cannot lie down flat, speaks in phrases, mild retractions, audible wheezing, pulse 100-120.    - SEVERE: Very SOB at rest, speaks in single words, struggling to breathe, sitting hunched forward, retractions, pulse > 120      Mild 5. RECURRENT SYMPTOM: "Have you had difficulty breathing before?" If Yes, ask: "When was the last time?" and "What happened that time?"      No 6. CARDIAC HISTORY: "Do you have any history of heart disease?" (e.g., heart attack, angina, bypass surgery, angioplasty)      No 7. LUNG HISTORY: "Do you have any history of lung disease?"  (e.g., pulmonary embolus, asthma, emphysema)     No 8. CAUSE: "What do you think is causing the breathing problem?"       URI 9. OTHER SYMPTOMS: "Do you have any other symptoms? (e.g., dizziness, runny nose, cough, chest pain, fever)     Flu 10. O2 SATURATION MONITOR:  "Do you use an oxygen saturation monitor (pulse oximeter) at home?" If Yes, ask: "What is your reading (oxygen level) today?" "What is your usual oxygen saturation reading?" (e.g., 95%)       No 11. PREGNANCY: "Is there any chance you are pregnant?" "When was your last menstrual period?"       No 12. TRAVEL: "Have you traveled out of the country in the last month?" (e.g., travel history, exposures)       No  Protocols used: Breathing Difficulty-A-AH

## 2023-04-08 NOTE — Patient Instructions (Signed)
It was nice to see you today,  We addressed the following topics today: - I have prescribed Augmentin for you to take for 5 days. - I have added additional 5 days of prednisone with a taper.  Start taking this after you finish your other prednisone. - I would like you to get a pulse oximeter at the store.  If your oxygen level gets below 90 you should have someone take you to the emergency department. -I would like you to do the following: - Use your Symbicort inhaler twice a day - Use your albuterol inhaler as needed in between then - I have also prescribed a albuterol nebulizer but if you have issues obtaining this you can just get the inhaler.  If you get this you can use it in place of your albuterol inhaler - You can use dextromethorphan as directed and guaifenesin.  The max dose of guaifenesin is 600 mg every 6 hours.  This can help your cough  Please let us know if you are not getting better after the above treatments.  Otherwise follow-up with me at your next scheduled appointment in March   Have a great day,  Frederic Jericho, MD

## 2023-04-08 NOTE — Progress Notes (Signed)
   Acute Office Visit  Subjective:     Patient ID: Morgan [name], female    DOB: October 13, 1965, 58 y.o.   MRN: [id number]  Chief Complaint  Patient presents with   Wheezing    HPI Patient is in today for wheezing.  Patient states her symptoms started last Saturday.  On Tuesday she tested positive for flu at an urgent care and was given prednisone, azithromycin, Symbicort.  Was not given Tamiflu.  She has no history of asthma or COPD.  A year ago she had wheezing with influenza infection as well.  Patient is on day 4 of her treatment and does not feel like her symptoms are getting better.   ROS      Objective:    BP 118/71   Pulse 66   Ht 5\' 1"  (1.549 m)   Wt 188 lb 14.4 oz (85.7 kg)   LMP 05/12/2011   SpO2 96%   BMI 35.69 kg/m    Physical Exam General: Alert, oriented CV: Rate and rhythm Pulmonary: Tachypneic, mild respiratory distress.  Wheezing bilaterally on exam.  No results found for any visits on 04/08/23.      Assessment & Plan:   Wheezing Assessment & Plan: Patient positive for influenza A.  This is day 6 of symptoms.  On exam she has significant wheezing, some tachypnea but O2 sat is 96%.  She does not having a history of asthma or COPD.  Currently taking Symbicort as needed, prednisone, azithromycin.  Recommended patient get a pulse oximeter and check her oxygen levels at home, and advised her to have someone take her to the emergency department if she becomes hypoxic - Adding additional prednisone for 5 additional days with a taper to start after she finishes her other dose tomorrow - Adding Augmentin. - Takes Symbicort twice a day, added albuterol to use as needed in between. - Prescribed nebulized albuterol but patient has not used this before and would need to get the nebulizer.   Other orders -     Full Kit Nebulizer Set; Use as needed for wheezing  Dispense: 1 each; Refill: 0 -     Albuterol Sulfate; Take 3 mLs (2.5 mg total) by nebulization  every 6 (six) hours as needed for wheezing or shortness of breath.  Dispense: 150 mL; Refill: 1 -     Albuterol Sulfate HFA; Inhale 2 puffs into the lungs every 6 (six) hours as needed for wheezing or shortness of breath.  Dispense: 8 g; Refill: 0 -     Amoxicillin-Pot Clavulanate; Take 1 tablet by mouth 2 (two) times daily for 5 days.  Dispense: 10 tablet; Refill: 0 -     predniSONE; Take 4 tablets (40 mg total) by mouth daily with breakfast for 5 days, THEN 3 tablets (30 mg total) daily with breakfast for 1 day, THEN 2 tablets (20 mg total) daily with breakfast for 1 day, THEN 1 tablet (10 mg total) daily with breakfast for 1 day, THEN 0.5 tablets (5 mg total) daily with breakfast for 4 days.  Dispense: 28 tablet; Refill: 0     Return if symptoms worsen or fail to improve.  Sandre Kitty, MD

## 2023-04-16 ENCOUNTER — Other Ambulatory Visit: Payer: Self-pay

## 2023-04-16 ENCOUNTER — Encounter (HOSPITAL_BASED_OUTPATIENT_CLINIC_OR_DEPARTMENT_OTHER): Payer: Self-pay

## 2023-04-16 ENCOUNTER — Emergency Department (HOSPITAL_BASED_OUTPATIENT_CLINIC_OR_DEPARTMENT_OTHER)
Admission: EM | Admit: 2023-04-16 | Discharge: 2023-04-17 | Disposition: A | Discharge status: Home / Self Care | Attending: Emergency Medicine | Admitting: Emergency Medicine

## 2023-04-16 ENCOUNTER — Emergency Department (HOSPITAL_BASED_OUTPATIENT_CLINIC_OR_DEPARTMENT_OTHER)

## 2023-04-16 DIAGNOSIS — E119 Type 2 diabetes mellitus without complications: Secondary | ICD-10-CM | POA: Diagnosis not present

## 2023-04-16 DIAGNOSIS — I4891 Unspecified atrial fibrillation: Secondary | ICD-10-CM | POA: Insufficient documentation

## 2023-04-16 DIAGNOSIS — Z7984 Long term (current) use of oral hypoglycemic drugs: Secondary | ICD-10-CM | POA: Diagnosis not present

## 2023-04-16 DIAGNOSIS — Z79899 Other long term (current) drug therapy: Secondary | ICD-10-CM | POA: Diagnosis not present

## 2023-04-16 DIAGNOSIS — Z794 Long term (current) use of insulin: Secondary | ICD-10-CM | POA: Insufficient documentation

## 2023-04-16 DIAGNOSIS — R0789 Other chest pain: Secondary | ICD-10-CM | POA: Diagnosis present

## 2023-04-16 DIAGNOSIS — I10 Essential (primary) hypertension: Secondary | ICD-10-CM | POA: Diagnosis not present

## 2023-04-16 DIAGNOSIS — K760 Fatty (change of) liver, not elsewhere classified: Secondary | ICD-10-CM | POA: Insufficient documentation

## 2023-04-16 DIAGNOSIS — J189 Pneumonia, unspecified organism: Secondary | ICD-10-CM

## 2023-04-16 DIAGNOSIS — J181 Lobar pneumonia, unspecified organism: Secondary | ICD-10-CM | POA: Diagnosis not present

## 2023-04-16 DIAGNOSIS — Z7901 Long term (current) use of anticoagulants: Secondary | ICD-10-CM | POA: Diagnosis not present

## 2023-04-16 LAB — BASIC METABOLIC PANEL
Anion gap: 9 (ref 5–15)
BUN: 6 mg/dL (ref 6–20)
CO2: 28 mmol/L (ref 22–32)
Calcium: 9.3 mg/dL (ref 8.9–10.3)
Chloride: 102 mmol/L (ref 98–111)
Creatinine, Ser: 0.62 mg/dL (ref 0.44–1.00)
GFR, Estimated: >60 mL/min (ref >60)
Glucose, Bld: 205 mg/dL — ABNORMAL HIGH (ref 70–99)
Potassium: 3.2 mmol/L — ABNORMAL LOW (ref 3.5–5.1)
Sodium: 139 mmol/L (ref 135–145)

## 2023-04-16 LAB — CBC
HCT: 44.4 % (ref 36.0–46.0)
Hemoglobin: 14.3 g/dL (ref 12.0–15.0)
MCH: 26.5 pg (ref 26.0–34.0)
MCHC: 32.2 g/dL (ref 30.0–36.0)
MCV: 82.4 fL (ref 80.0–100.0)
Platelets: 239 10*3/uL (ref 150–400)
RBC: 5.39 MIL/uL — ABNORMAL HIGH (ref 3.87–5.11)
RDW: 13.7 % (ref 11.5–15.5)
WBC: 7 10*3/uL (ref 4.0–10.5)
nRBC: 0 % (ref 0.0–0.2)

## 2023-04-16 LAB — TROPONIN I (HIGH SENSITIVITY): Troponin I (High Sensitivity): 2 ng/L (ref <18)

## 2023-04-16 NOTE — ED Triage Notes (Signed)
 She is here with c/o "steady chest pressure" since yesterday evening. She also tells me she has had a-fib for ~ 2 years and has been anticoagulated that same interval. She also tells me she has an impending ablation (March 31) with Dr. Hyacinth Meeker. Her skin is normal, warm and dry and she is breathing normally.

## 2023-04-17 ENCOUNTER — Emergency Department (HOSPITAL_BASED_OUTPATIENT_CLINIC_OR_DEPARTMENT_OTHER)

## 2023-04-17 LAB — D-DIMER, QUANTITATIVE: D-Dimer, Quant: <0.27 ug{FEU}/mL (ref 0.00–0.50)

## 2023-04-17 LAB — TROPONIN I (HIGH SENSITIVITY): Troponin I (High Sensitivity): <2 ng/L (ref <18)

## 2023-04-17 MED ORDER — DOXYCYCLINE HYCLATE 100 MG PO CAPS: 100.0000 mg | ORAL_CAPSULE | Freq: Two times a day (BID) | ORAL | 0 refills | Status: DC

## 2023-04-17 MED ORDER — ALUM & MAG HYDROXIDE-SIMETH 200-200-20 MG/5ML PO SUSP
30.0000 mL | Freq: Once | ORAL | Status: AC
  Administered 2023-04-17: 30 mL via ORAL
  Filled 2023-04-17: qty 30

## 2023-04-17 MED ORDER — IPRATROPIUM-ALBUTEROL 0.5-2.5 (3) MG/3ML IN SOLN
3.0000 mL | Freq: Once | RESPIRATORY_TRACT | Status: AC
  Administered 2023-04-17: 3 mL via RESPIRATORY_TRACT
  Filled 2023-04-17: qty 3

## 2023-04-17 MED ORDER — POTASSIUM CHLORIDE CRYS ER 20 MEQ PO TBCR
40.0000 meq | EXTENDED_RELEASE_TABLET | Freq: Once | ORAL | Status: AC
  Administered 2023-04-17: 40 meq via ORAL
  Filled 2023-04-17: qty 2

## 2023-04-17 MED ORDER — LIDOCAINE VISCOUS HCL 2 % MT SOLN
15.0000 mL | Freq: Once | OROMUCOSAL | Status: AC
  Administered 2023-04-17: 15 mL via OROMUCOSAL
  Filled 2023-04-17: qty 15

## 2023-04-17 MED ORDER — IOHEXOL 350 MG/ML SOLN
100.0000 mL | Freq: Once | INTRAVENOUS | Status: AC | PRN
  Administered 2023-04-17: 100 mL via INTRAVENOUS

## 2023-04-17 MED ORDER — KETOROLAC TROMETHAMINE 30 MG/ML IJ SOLN
15.0000 mg | Freq: Once | INTRAMUSCULAR | Status: AC
  Administered 2023-04-17: 15 mg via INTRAVENOUS
  Filled 2023-04-17: qty 1

## 2023-04-17 NOTE — Discharge Instructions (Signed)
 There is no evidence of heart attack or blood clot in your lung.  Take the medications as prescribed.  Follow-up with your doctor.  Return to the ED with exertional chest pain, pain associate with shortness of breath, nausea, vomiting, sweating or other concerns.

## 2023-04-17 NOTE — ED Provider Notes (Signed)
 [place] EMERGENCY DEPARTMENT AT [facility] Provider Note   CSN: [id number] Arrival date & time: 04/16/23  1825     History  Chief Complaint  Patient presents with   Chest Pain    Morgan [name] is a 58 y.o. female.  Patient with a history of atrial fibrillation on Eliquis, diabetes, hypertension, GERD, migraine headaches presents with constant chest "tightness and pressure" since yesterday.  This has been lasting for about 26 hours and is constant.  Nothing makes it better or worse.  No radiation of the pain to her arms, neck or back.  Some shortness of breath.  Recently got over the flu and still having some cough.  No abdominal pain, nausea or vomiting.  No diaphoresis.  Pain is not exertional or pleuritic.  Denies any cardiac history.  Does not miss any doses of her Eliquis.  No leg pain or leg swelling.  She never had this tightness before. Chest tightness is constant and steady.  Has a history of atrial fibrillation and is scheduled for an ablation March 31.  Denies any CAD or stents.  The history is provided by the patient.  Chest Pain Associated symptoms: shortness of breath   Associated symptoms: no abdominal pain, no fever, no headache, no nausea, no vomiting and no weakness        Home Medications Prior to Admission medications   Medication Sig Start Date End Date Taking? Authorizing Provider  acetaminophen (TYLENOL) 500 MG tablet Take 500 mg by mouth every 6 (six) hours as needed for mild pain.    [provider]  albuterol (PROVENTIL) (2.5 MG/3ML) 0.083% nebulizer solution Take 3 mLs (2.5 mg total) by nebulization every 6 (six) hours as needed for wheezing or shortness of breath. 04/08/23   Sandre Kitty, MD  albuterol (VENTOLIN HFA) 108 (90 Base) MCG/ACT inhaler Inhale 2 puffs into the lungs every 6 (six) hours as needed for wheezing or shortness of breath. 04/08/23   Sandre Kitty, MD  apixaban (ELIQUIS) 5 MG TABS tablet TAKE 1 TABLET BY MOUTH  TWICE A DAY 11/01/22   Corky Crafts, MD  ARIPiprazole (ABILIFY) 15 MG tablet Take 15 mg by mouth at bedtime. 12/21/17   Ellis Savage, NP  azithromycin (ZITHROMAX) 250 MG tablet Take 2 tabs PO x 1 dose, then 1 tab PO QD x 4 days 04/05/23   Bing Neighbors, NP  budesonide-formoterol [facility]) 80-4.5 MCG/ACT inhaler Inhale 2 puffs into the lungs in the morning and at bedtime. 01/03/23   Assaker, [place] Bali, MD  clotrimazole (LOTRIMIN) 1 % cream Apply to affected area 2 times daily 08/19/22   Gustavus Bryant, FNP  cyclobenzaprine (FLEXERIL) 5 MG tablet Take 1 tablet (5 mg total) by mouth 2 (two) times daily as needed for muscle spasms. 09/07/22   Small, Brooke L, PA  DULoxetine (CYMBALTA) 60 MG capsule Take 60 mg by mouth at bedtime. 11/04/16   [provider]  fluconazole (DIFLUCAN) 150 MG tablet Take 1 tablet (150 mg total) by mouth daily. Take 1 tablet now and repeat in 3 days if symptoms persist 12/13/22   Doristine Mango A, PA-C  fluticasone (FLONASE) 50 MCG/ACT nasal spray Place 1 spray into both nostrils daily as needed for allergies.    [provider]  Insulin Aspart FlexPen (NOVOLOG) 100 UNIT/ML INJECT 10 UNITS INTO THE SKIN BEFORE BREAKFAST AND LUNCH. INJECT 13 UNITS INTO THE SKIN BEFORE SUPPER. 02/28/23   Saralyn Pilar A, PA  lidocaine (LIDODERM) 5 %  Place 1 patch onto the skin daily as needed (pain).    [provider]  nebivolol (BYSTOLIC) 2.5 MG tablet Take 1 tablet (2.5 mg total) by mouth at bedtime. 10/26/22   Swinyer, Zachary George, NP  nystatin cream (MYCOSTATIN) Apply to affected area 2 times daily 12/13/22   Doristine Mango A, PA-C  omeprazole (PRILOSEC OTC) 20 MG tablet Take 20 mg by mouth at bedtime.    [provider]  ondansetron (ZOFRAN-ODT) 4 MG disintegrating tablet Take 1 tablet (4 mg total) by mouth every 8 (eight) hours as needed. 09/30/22   Sandre Kitty, MD  oxycodone (OXY-IR) 5 MG capsule Take 5 mg by mouth every 4 (four) hours as  needed for pain.    [provider]  predniSONE (DELTASONE) 10 MG tablet Take 4 tablets (40 mg total) by mouth daily with breakfast for 5 days, THEN 3 tablets (30 mg total) daily with breakfast for 1 day, THEN 2 tablets (20 mg total) daily with breakfast for 1 day, THEN 1 tablet (10 mg total) daily with breakfast for 1 day, THEN 0.5 tablets (5 mg total) daily with breakfast for 4 days. 04/10/23 04/22/23  Sandre Kitty, MD  predniSONE (DELTASONE) 20 MG tablet Take 2 tablets (40 mg total) by mouth daily with breakfast. 04/05/23   Bing Neighbors, NP  Respiratory Therapy Supplies (FULL KIT NEBULIZER SET) MISC Use as needed for wheezing 04/08/23   Sandre Kitty, MD  REXULTI 2 MG TABS tablet Take 2 mg by mouth daily. 03/24/23   [provider]  rosuvastatin (CRESTOR) 10 MG tablet Take 1 tablet (10 mg total) by mouth daily. 04/08/22   Corky Crafts, MD  tiZANidine (ZANAFLEX) 4 MG tablet Take 4 mg by mouth at bedtime as needed for muscle spasms. 05/14/21   [provider]  XIGDUO XR 11-998 MG TB24 TAKE 1 TABLET BY MOUTH EVERYDAY AT BEDTIME 04/07/23   Saralyn Pilar A, PA      Allergies    Clindamycin/lincomycin, Lamictal [lamotrigine], Banana, Lipitor [atorvastatin], and Pineapple    Review of Systems   Review of Systems  Constitutional:  Negative for appetite change and fever.  HENT:  Negative for congestion and rhinorrhea.   Respiratory:  Positive for chest tightness and shortness of breath.   Cardiovascular:  Positive for chest pain.  Gastrointestinal:  Negative for abdominal pain, nausea and vomiting.  Genitourinary:  Negative for dysuria and hematuria.  Skin:  Negative for wound.  Neurological:  Negative for weakness and headaches.   all other systems are negative except as noted in the HPI and PMH.    Physical Exam Updated Vital Signs BP 137/77   Pulse 66   Temp 97.7 F (36.5 C) (Oral)   Resp 15   LMP 05/12/2011   SpO2 100%  Physical Exam Vitals and  nursing note reviewed.  Constitutional:      General: She is not in acute distress.    Appearance: She is well-developed.  HENT:     Head: Normocephalic and atraumatic.     Mouth/Throat:     Pharynx: No oropharyngeal exudate.  Eyes:     Conjunctiva/sclera: Conjunctivae normal.     Pupils: Pupils are equal, round, and reactive to light.  Neck:     Comments: No meningismus. Cardiovascular:     Rate and Rhythm: Normal rate and regular rhythm.     Heart sounds: Normal heart sounds. No murmur heard. Pulmonary:     Effort: Pulmonary effort is  normal. No respiratory distress.     Breath sounds: Normal breath sounds.  Chest:     Chest wall: No tenderness.  Abdominal:     Palpations: Abdomen is soft.     Tenderness: There is no abdominal tenderness. There is no guarding or rebound.  Musculoskeletal:        General: No tenderness. Normal range of motion.     Cervical back: Normal range of motion and neck supple.  Skin:    General: Skin is warm.  Neurological:     Mental Status: She is alert and oriented to person, place, and time.     Cranial Nerves: No cranial nerve deficit.     Motor: No abnormal muscle tone.     Coordination: Coordination normal.     Comments:  5/5 strength throughout. CN 2-12 intact.Equal grip strength.   Psychiatric:        Behavior: Behavior normal.     ED Results / Procedures / Treatments   Labs (all labs ordered are listed, but only abnormal results are displayed) Labs Reviewed  BASIC METABOLIC PANEL - Abnormal; Notable for the following components:      Result Value   Potassium 3.2 (*)    Glucose, Bld 205 (*)    All other components within normal limits  CBC - Abnormal; Notable for the following components:   RBC 5.39 (*)    All other components within normal limits  D-DIMER, QUANTITATIVE  TROPONIN I (HIGH SENSITIVITY)  TROPONIN I (HIGH SENSITIVITY)    EKG EKG Interpretation Date/Time:  Saturday April 16 2023 18:36:15 EST Ventricular Rate:   66 PR Interval:  154 QRS Duration:  78 QT Interval:  394 QTC Calculation: 413 R Axis:   35  Text Interpretation: Normal sinus rhythm Normal ECG Confirmed by Vonita Moss [id number] on 04/16/2023 6:58:17 PM  Radiology CT Angio Chest/Abd/Pel for Dissection W and/or Wo Contrast Result Date: 04/17/2023 CLINICAL DATA:  Complains of study chest pressure since yesterday evening. History of atrial fibrillation. Evaluate for acute aortic syndrome. EXAM: CT ANGIOGRAPHY CHEST, ABDOMEN AND PELVIS TECHNIQUE: Non-contrast CT of the chest was initially obtained. Multidetector CT imaging through the chest, abdomen and pelvis was performed using the standard protocol during bolus administration of intravenous contrast. Multiplanar reconstructed images and MIPs were obtained and reviewed to evaluate the vascular anatomy. RADIATION DOSE REDUCTION: This exam was performed according to the departmental dose-optimization program which includes automated exposure control, adjustment of the mA and/or kV according to patient size and/or use of iterative reconstruction technique. CONTRAST:  OMNIPAQUE IOHEXOL 350 MG/ML SOLN COMPARISON:  03/31/2023. FINDINGS: CTA CHEST FINDINGS Cardiovascular: Satisfactory opacification of the pulmonary arteries to the segmental level. No evidence of pulmonary embolism. Normal heart size. No pericardial effusion. Mild aortic atherosclerotic calcifications. Mediastinum/Nodes: No enlarged mediastinal, hilar, or axillary lymph nodes. Thyroid gland, trachea, and esophagus demonstrate no significant findings. Lungs/Pleura: No pleural effusion, airspace consolidation, atelectasis, or pneumothorax. No signs of interstitial edema. Within the central left lower lobe there is cluster of patchy ground-glass densities, image 111/7. Perihilar nodule in the left midlung measures 4 mm, image 82/7. 2 mm nodule within the periphery of the right upper lobe, image 52/7. Several additional less than 5 mm lung  nodules are also noted. Musculoskeletal: No acute osseous findings. Review of the MIP images confirms the above findings. CTA ABDOMEN AND PELVIS FINDINGS VASCULAR Aorta: Normal caliber aorta without aneurysm, dissection, vasculitis or significant stenosis. Celiac: Patent without evidence of aneurysm, dissection, vasculitis or significant stenosis.  SMA: Patent without evidence of aneurysm, dissection, vasculitis or significant stenosis. Renals: Both renal arteries are patent without evidence of aneurysm, dissection, vasculitis, fibromuscular dysplasia or significant stenosis. IMA: Patent without evidence of aneurysm, dissection, vasculitis or significant stenosis. Inflow: Patent without evidence of aneurysm, dissection, vasculitis or significant stenosis. Veins: No obvious venous abnormality within the limitations of this arterial phase study. Review of the MIP images confirms the above findings. NON-VASCULAR Hepatobiliary: Hepatic steatosis. Arterial phase peripheral enhancing lesion within the lateral right lobe of liver measures 1.8 cm. 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 No suspicious liver lesions. Gallbladder normal. No bile duct dilatation. Pancreas: Unremarkable. No pancreatic ductal dilatation or surrounding inflammatory changes. Spleen: Normal in size without focal abnormality. Adrenals/Urinary Tract: Normal adrenal glands. Right kidney normal. Ectopic left pelvic kidney. No mass or hydronephrosis identified. Urinary bladder appears normal. Stomach/Bowel: Stomach is within normal limits. No pathologic dilatation of the large or small bowel loops. No bowel wall thickening, inflammation, or distension. Lymphatic: No enlarged abdominopelvic lymph nodes. Reproductive: Hysterectomy.  No adnexal mass. Other: No free fluid or fluid collections. No signs of pneumoperitoneum. Musculoskeletal: No acute findings. Status post PLIF at L4-5. Unchanged sclerotic lesion within the L2 vertebra.  Review of the MIP images confirms the above findings. IMPRESSION: 1. No evidence for acute aortic syndrome. 2. No evidence for acute pulmonary embolus. 3. Cluster of patchy ground-glass densities within the central left lower lobe. Findings are favored to represent a mild infectious or inflammatory process. 4. Multiple bilateral lung nodules measuring up to 4 mm. Per Fleischner Society Guidelines, if patient is low risk for malignancy, no routine follow-up imaging is recommended. If patient is high risk for malignancy, a non-contrast Chest CT at 12 months is optional. 5. Hepatic steatosis. 6. Ectopic left pelvic kidney. 7.  Aortic Atherosclerosis (ICD10-I70.0). Electronically Signed   By: Signa Kell M.D.   On: 04/17/2023 05:26   DG Chest Port 1 View Result Date: 04/17/2023 CLINICAL DATA:  Chest pain EXAM: PORTABLE CHEST 1 VIEW COMPARISON:  02/17/2023 FINDINGS: The heart size and mediastinal contours are within normal limits. Both lungs are clear. The visualized skeletal structures are unremarkable. Postsurgical changes in the cervical spine are noted. IMPRESSION: No active disease. Electronically Signed   By: Alcide Clever M.D.   On: 04/17/2023 00:09    Procedures Procedures    Medications Ordered in ED Medications - No data to display  ED Course/ Medical Decision Making/ A&P                                 Medical Decision Making Amount and/or Complexity of Data Reviewed Labs: ordered. Decision-making details documented in ED Course. Radiology: ordered and independent interpretation performed. Decision-making details documented in ED Course. ECG/medicine tests: ordered and independent interpretation performed. Decision-making details documented in ED Course.  Risk OTC drugs. Prescription drug management.   26 hours of constant chest pressure and tightness.  EKG shows no acute ischemia.  No acute ST changes.  No hypoxia or increased work of breathing.  Chest pain is not  reproducible.  Patient with negative CT coronary study in 2019. Toradol and GI cocktail given.   Labs reassuring.  Troponin negative.  Chest x-ray negative for infiltrate or pneumothorax.  Patient's pain has improved with Toradol as well as GI cocktail.  Low suspicion for ACS, PE, aortic dissection.  Her D-dimer is negative and she is therapeutic on her Eliquis.  CT scan shows normal aorta.  No evidence of aneurysm or dissection.  No evidence of pulmonary embolism.  Does have small area of airspace disease will treat with antibiotics for possible pneumonia  Patient made aware of lung nodule need for follow-up CT scan in 1 year.  Troponin negative x 2.  Low suspicion for ACS.  EKG is unchanged.  Discussed with patient we will treat for pneumonia.  This may be contributing to her discomfort.  Follow-up with her cardiologist for reevaluation and possible consideration of repeat stress test.  Return to the ED with exertional chest pain, pain associate with shortness of breath, nausea, vomiting, sweating or other concerns.        Final Clinical Impression(s) / ED Diagnoses Final diagnoses:  Atypical chest pain  Community acquired pneumonia of left lower lobe of lung    Rx / DC Orders ED Discharge Orders     None         Laquetta Racey, Jeannett Senior, MD 04/17/23 [phone]

## 2023-04-18 ENCOUNTER — Telehealth (HOSPITAL_COMMUNITY): Payer: Self-pay

## 2023-04-18 LAB — BASIC METABOLIC PANEL
BUN/Creatinine Ratio: 8 — ABNORMAL LOW (ref 9–23)
BUN: 5 mg/dL — ABNORMAL LOW (ref 6–24)
CO2: 23 mmol/L (ref 20–29)
Calcium: 9.4 mg/dL (ref 8.7–10.2)
Chloride: 102 mmol/L (ref 96–106)
Creatinine, Ser: 0.63 mg/dL (ref 0.57–1.00)
Glucose: 156 mg/dL — ABNORMAL HIGH (ref 70–99)
Potassium: 3.9 mmol/L (ref 3.5–5.2)
Sodium: 139 mmol/L (ref 134–144)
eGFR: 105 mL/min/{1.73_m2} (ref >59)

## 2023-04-18 NOTE — Telephone Encounter (Signed)
 Pt is scheduled for an Afib ablation on 3/31. She had an ED visit on 2/18 for Influenza and asthma; ED visit on 3/1- treated for possible PNA. She is taking Antibiotics and Prednisone. Per Dr. Nelly Laurence, if she's improving, 3/31 should be ok.   Attempted to reach patient to discuss upcoming procedure, no answer. Left VM for patient to return call.

## 2023-04-19 ENCOUNTER — Encounter: Payer: Self-pay | Admitting: Cardiovascular Disease

## 2023-04-19 LAB — CBC
Hematocrit: 40.6 % (ref 34.0–46.6)
Hemoglobin: 13.1 g/dL (ref 11.1–15.9)
MCH: 26.6 pg (ref 26.6–33.0)
MCHC: 32.3 g/dL (ref 31.5–35.7)
MCV: 83 fL (ref 79–97)
Platelets: 235 10*3/uL (ref 150–450)
RBC: 4.92 x10E6/uL (ref 3.77–5.28)
RDW: 14 % (ref 11.7–15.4)
WBC: 6.6 10*3/uL (ref 3.4–10.8)

## 2023-04-20 NOTE — Telephone Encounter (Signed)
 Informed patient of Dr. Morrie Sheldon recommendations. Patient voiced understanding. She currently reports that her symptoms have 90% improved. She continues to have a mild cough and chest congestion.   She has completed her Prednisone dose pack and will finish her course of Doxycycline around 04/28/23. Patient made aware to contact office to inform of any new medications started or no improvement in symptoms, which will require procedure to be postponed.   Pre-procedure testing scheduled: CT on 04/25/23 and lab work completed on 04/18/23. Confirmed patient is taking Eliquis and will continue taking medication before procedure or it may need to be rescheduled.  Confirmed patient is scheduled for Atrial Fibrillation Ablation on Monday, March 31 with Dr. York Pellant. Instructed patient to arrive at the Main Entrance A at [facility]: [address] [place], [place] [address] and check in at Admitting at 8:00 AM  Advised of plan to go home the same day and will only stay overnight if medically necessary. You MUST have a responsible adult to drive you home and MUST be with you the first 24 hours after you arrive home or your procedure could be cancelled.  Patient verbalized understanding to information provided and is agreeable to proceed with procedure.

## 2023-04-22 ENCOUNTER — Telehealth: Payer: Self-pay | Admitting: Cardiovascular Disease

## 2023-04-22 ENCOUNTER — Encounter: Payer: Self-pay | Admitting: Cardiovascular Disease

## 2023-04-22 NOTE — Telephone Encounter (Signed)
 Patient stated she had an EKG 3/5 and wants to know if the results will affect her having the ablation on 3/31.

## 2023-04-22 NOTE — Telephone Encounter (Signed)
 Spoke with patient, EKG on 3/5 not concerning per Dr. Nelly Laurence and does not impact her upcoming ablation

## 2023-04-25 ENCOUNTER — Ambulatory Visit (HOSPITAL_COMMUNITY)
Admission: RE | Admit: 2023-04-25 | Discharge: 2023-04-25 | Disposition: A | Discharge status: Home / Self Care | Payer: Federal, State, Local not specified - PPO | Source: Ambulatory Visit | Attending: Cardiovascular Disease | Admitting: Cardiovascular Disease

## 2023-04-25 DIAGNOSIS — I480 Paroxysmal atrial fibrillation: Secondary | ICD-10-CM

## 2023-04-25 MED ORDER — IOHEXOL 350 MG/ML SOLN
100.0000 mL | Freq: Once | INTRAVENOUS | Status: AC | PRN
  Administered 2023-04-25: 100 mL via INTRAVENOUS

## 2023-04-27 ENCOUNTER — Encounter: Payer: Self-pay | Admitting: Cardiovascular Disease

## 2023-05-03 ENCOUNTER — Ambulatory Visit: Payer: Federal, State, Local not specified - PPO | Admitting: Family Medicine

## 2023-05-03 ENCOUNTER — Encounter: Payer: Self-pay | Admitting: Family Medicine

## 2023-05-03 VITALS — BP 110/71 | HR 77 | Ht 61.0 in | Wt 186.8 lb

## 2023-05-03 DIAGNOSIS — Z794 Long term (current) use of insulin: Secondary | ICD-10-CM | POA: Diagnosis not present

## 2023-05-03 DIAGNOSIS — E785 Hyperlipidemia, unspecified: Secondary | ICD-10-CM | POA: Diagnosis not present

## 2023-05-03 DIAGNOSIS — I480 Paroxysmal atrial fibrillation: Secondary | ICD-10-CM | POA: Diagnosis not present

## 2023-05-03 DIAGNOSIS — E1169 Type 2 diabetes mellitus with other specified complication: Secondary | ICD-10-CM

## 2023-05-03 MED ORDER — INSULIN ASPART FLEXPEN 100 UNIT/ML ~~LOC~~ SOPN: PEN_INJECTOR | SUBCUTANEOUS | 1 refills | Status: DC

## 2023-05-03 NOTE — Assessment & Plan Note (Signed)
 LDL at goal.  Continue rosuvastatin 10 mg

## 2023-05-03 NOTE — Patient Instructions (Signed)
 It was nice to see you today,  We addressed the following topics today: -I will send in your NovoLog refill - When I see you next time in 2 months we can discuss if you want a switch from short acting NovoLog to long-acting Lantus.  This is a safer alternative and you only have to inject yourself once a day - We can also discuss if you would like to use the freestyle libre or Dexcom continuous glucose monitors.   Have a great day,  Frederic Jericho, MD

## 2023-05-03 NOTE — Progress Notes (Signed)
   Established Patient Office Visit  Subjective   Patient ID: Morgan [name], female    DOB: 04/30/1965  Age: 58 y.o. MRN: [id number]  Chief Complaint  Patient presents with   Medical Management of Chronic Issues    HPI   I recently saw the patient for flu and wheezing.  She then went to the ED a week later for chest pain.  Both of these issues have resolved.  No concerns currently.  Patient has a upcoming appointment on 3/31 for ablation for A-fib.  DM2-patient currently taking Xigduo and NovoLog.  She takes NovoLog 12 units 3 times a day with meals.  A1c was last drawn 1 month ago so we will wait another 2 months before rechecking.  We did discuss alternatives including switching from short acting to long-acting for ease of use and less risk of hypoglycemia and easier titration.  Also discussed continuous glucose monitoring as an option.  Patient should qualify if she is using insulin.  Patient recently had her cholesterol measured and it was at goal.  She is taking rosuvastatin.    The 10-year ASCVD risk score (Arnett DK, et al., 2019) is: 3.7%  Health Maintenance Due  Topic Date Due   COVID-19 Vaccine (1) Never done   Pneumococcal Vaccine 105-72 Years old (2 of 2 - PCV) 12/06/2019   Zoster Vaccines- Shingrix (2 of 2) 08/18/2022   Colonoscopy  05/01/2023   OPHTHALMOLOGY EXAM  04/22/2023      Objective:     BP 110/71   Pulse 77   Ht 5\' 1"  (1.549 m)   Wt 186 lb 12.8 oz (84.7 kg)   LMP 05/12/2011   SpO2 95%   BMI 35.30 kg/m    Physical Exam General: Alert, oriented CV: Regular rhythm Pulmonary: Lungs clear bilaterally no wheezing.   No results found for any visits on 05/03/23.      Assessment & Plan:   Paroxysmal atrial fibrillation [facility]) Assessment & Plan: Has a ablation scheduled for the end of this month.  Taking Eliquis and nebivolol.   Type 2 diabetes mellitus with other specified complication, with long-term current use of insulin  (HCC) Assessment & Plan: Last A1c 1 month ago was 8.2.  She is taking Xigduo-1000 and NovoLog 12 units 3 times daily with meals.  Uncertain what circumstances initially led to taking short acting insulin without having taken long-acting.  She does not have type 1 diabetes.  Would likely be able to get better control with less risk of hypoglycemia if we switched from her short acting to a long-acting insulin in the morning and prescribed a continuous glucose monitor.  Will discuss this again in 2 months at next visit.  Orders: -     Insulin Aspart FlexPen; INJECT 10 UNITS INTO THE SKIN BEFORE BREAKFAST AND LUNCH. INJECT 13 UNITS INTO THE SKIN BEFORE SUPPER.  Dispense: 15 mL; Refill: 1  Hyperlipidemia associated with type 2 diabetes mellitus (HCC) Assessment & Plan: LDL at goal.  Continue rosuvastatin 10 mg      Return in about 2 months (around 07/03/2023) for DM.    Sandre Kitty, MD

## 2023-05-03 NOTE — Assessment & Plan Note (Signed)
 Has a ablation scheduled for the end of this month.  Taking Eliquis and nebivolol.

## 2023-05-03 NOTE — Assessment & Plan Note (Signed)
 Last A1c 1 month ago was 8.2.  She is taking Xigduo-1000 and NovoLog 12 units 3 times daily with meals.  Uncertain what circumstances initially led to taking short acting insulin without having taken long-acting.  She does not have type 1 diabetes.  Would likely be able to get better control with less risk of hypoglycemia if we switched from her short acting to a long-acting insulin in the morning and prescribed a continuous glucose monitor.  Will discuss this again in 2 months at next visit.

## 2023-05-09 ENCOUNTER — Telehealth (HOSPITAL_COMMUNITY): Payer: Self-pay

## 2023-05-09 NOTE — Telephone Encounter (Signed)
 Call placed to patient to discuss upcoming procedure.   CT: completed.  Labs: completed.   Any recent signs of acute illness or been started on antibiotics? No. Patient reports she has fully recovered from the flu and denies any asthma symptoms.  Any new medications started? No Any medications to hold? Xigduo 3 days, Novolog as directed Any missed doses of blood thinner?  No Advised patient to continue taking ANTICOAGULANT: Eliquis (Apixaban) without missing any doses.  Medication instructions:  On the morning of your procedure DO NOT take any medication., including Eliquis or the procedure may be rescheduled. Nothing to eat or drink after midnight prior to your procedure.  Confirmed patient is scheduled for Atrial Fibrillation Ablation on Monday, March 31 with Dr. York Pellant. Instructed patient to arrive at the Main Entrance A at [facility]: [address] [place], [place] [address] and check in at Admitting at 8:00 AM/PM   Advised of plan to go home the same day and will only stay overnight if medically necessary. You MUST have a responsible adult to drive you home and MUST be with you the first 24 hours after you arrive home or your procedure could be cancelled.  Patient verbalized understanding to all instructions provided and agreed to proceed with procedure.

## 2023-05-13 NOTE — Pre-Procedure Instructions (Signed)
 Instructed patient on the following items: Arrival time 0800 Nothing to eat or drink after midnight No meds AM of procedure Responsible person to drive you home and stay with you for 24 hrs  Have you missed any doses of anti-coagulant Eliquis- takes twice a day, hasn't missed any doses in last 4 weeks.  Don't take dose morning of procedure.

## 2023-05-15 NOTE — Anesthesia Preprocedure Evaluation (Signed)
 Anesthesia Evaluation  Patient identified by MRN, date of birth, ID band Patient awake    Reviewed: Allergy & Precautions, NPO status , Patient's Chart, lab work & pertinent test results  History of Anesthesia Complications Negative for: history of anesthetic complications  Airway Mallampati: III  TM Distance: >3 FB Neck ROM: Full    Dental no notable dental hx. (+) Dental Advisory Given, Chipped   Pulmonary neg pulmonary ROS, neg recent URI   Pulmonary exam normal breath sounds clear to auscultation       Cardiovascular hypertension, Pt. on medications  Rhythm:Regular Rate:Normal + Systolic murmurs Echo 04/2022  1. Left ventricular ejection fraction, by estimation, is 60 to 65%. The left ventricle has normal function. The left ventricle has no regional wall motion abnormalities. There is mild left ventricular hypertrophy. Left ventricular diastolic parameters were normal.   2. Right ventricular systolic function is normal. The right ventricular size is normal. There is normal pulmonary artery systolic pressure. The estimated right ventricular systolic pressure is 25.5 mmHg.   3. The mitral valve is normal in structure. Trivial mitral valve regurgitation. No evidence of mitral stenosis.   4. The aortic valve is tricuspid. Aortic valve regurgitation is not visualized. No aortic stenosis is present.   5. The inferior vena cava is normal in size with greater than 50% respiratory variability, suggesting right atrial pressure of 3 mmHg.      Neuro/Psych  Headaches PSYCHIATRIC DISORDERS Anxiety Depression Bipolar Disorder      GI/Hepatic Neg liver ROS,GERD  Medicated and Controlled,,  Endo/Other  diabetes, Type 2, Insulin Dependent   Obesity   Renal/GU Renal disease     Musculoskeletal negative musculoskeletal ROS (+)    Abdominal   Peds  Hematology negative hematology ROS (+)   Anesthesia Other Findings     Reproductive/Obstetrics                              Anesthesia Physical Anesthesia Plan  ASA: 3  Anesthesia Plan: General   Post-op Pain Management: Tylenol PO (pre-op)*   Induction: Intravenous  PONV Risk Score and Plan: 3 and Treatment may vary due to age or medical condition, Ondansetron, Dexamethasone and Midazolam  Airway Management Planned: Oral ETT  Additional Equipment: None  Intra-op Plan:   Post-operative Plan: Extubation in OR  Informed Consent: I have reviewed the patients History and Physical, chart, labs and discussed the procedure including the risks, benefits and alternatives for the proposed anesthesia with the patient or authorized representative who has indicated his/her understanding and acceptance.     Dental advisory given  Plan Discussed with: CRNA  Anesthesia Plan Comments:          Anesthesia Quick Evaluation

## 2023-05-16 ENCOUNTER — Ambulatory Visit (HOSPITAL_COMMUNITY): Payer: Self-pay

## 2023-05-16 ENCOUNTER — Other Ambulatory Visit: Payer: Self-pay

## 2023-05-16 ENCOUNTER — Encounter (HOSPITAL_COMMUNITY): Payer: Self-pay | Admitting: Cardiovascular Disease

## 2023-05-16 ENCOUNTER — Ambulatory Visit (HOSPITAL_COMMUNITY)
Admission: RE | Admit: 2023-05-16 | Discharge: 2023-05-16 | Disposition: A | Discharge status: Home / Self Care | Payer: Federal, State, Local not specified - PPO | Source: Ambulatory Visit | Attending: Cardiovascular Disease | Admitting: Cardiovascular Disease

## 2023-05-16 ENCOUNTER — Encounter (HOSPITAL_COMMUNITY)
Admission: RE | Disposition: A | Discharge status: Home / Self Care | Payer: Self-pay | Source: Ambulatory Visit | Attending: Cardiovascular Disease

## 2023-05-16 DIAGNOSIS — D6869 Other thrombophilia: Secondary | ICD-10-CM | POA: Insufficient documentation

## 2023-05-16 DIAGNOSIS — Z794 Long term (current) use of insulin: Secondary | ICD-10-CM | POA: Insufficient documentation

## 2023-05-16 DIAGNOSIS — E119 Type 2 diabetes mellitus without complications: Secondary | ICD-10-CM | POA: Insufficient documentation

## 2023-05-16 DIAGNOSIS — K219 Gastro-esophageal reflux disease without esophagitis: Secondary | ICD-10-CM | POA: Diagnosis not present

## 2023-05-16 DIAGNOSIS — R002 Palpitations: Secondary | ICD-10-CM | POA: Insufficient documentation

## 2023-05-16 DIAGNOSIS — I10 Essential (primary) hypertension: Secondary | ICD-10-CM | POA: Diagnosis not present

## 2023-05-16 DIAGNOSIS — Z7901 Long term (current) use of anticoagulants: Secondary | ICD-10-CM | POA: Insufficient documentation

## 2023-05-16 DIAGNOSIS — I480 Paroxysmal atrial fibrillation: Secondary | ICD-10-CM | POA: Diagnosis present

## 2023-05-16 HISTORY — PX: ATRIAL FIBRILLATION ABLATION: EP1191

## 2023-05-16 LAB — GLUCOSE, CAPILLARY
Glucose-Capillary: 144 mg/dL — ABNORMAL HIGH (ref 70–99)
Glucose-Capillary: 146 mg/dL — ABNORMAL HIGH (ref 70–99)

## 2023-05-16 SURGERY — ATRIAL FIBRILLATION ABLATION
Anesthesia: General

## 2023-05-16 MED ORDER — SODIUM CHLORIDE 0.9% FLUSH: 3.0000 mL | INTRAVENOUS | Status: DC | PRN

## 2023-05-16 MED ORDER — FENTANYL CITRATE (PF) 250 MCG/5ML IJ SOLN
INTRAMUSCULAR | Status: DC | PRN
  Administered 2023-05-16 (×2): 50 ug via INTRAVENOUS

## 2023-05-16 MED ORDER — PHENYLEPHRINE HCL-NACL 20-0.9 MG/250ML-% IV SOLN
INTRAVENOUS | Status: DC | PRN
  Administered 2023-05-16: 15 ug/min via INTRAVENOUS

## 2023-05-16 MED ORDER — SODIUM CHLORIDE 0.9 % IV SOLN: 250.0000 mL | INTRAVENOUS | Status: DC | PRN

## 2023-05-16 MED ORDER — ROCURONIUM BROMIDE 10 MG/ML (PF) SYRINGE
PREFILLED_SYRINGE | INTRAVENOUS | Status: DC | PRN
Start: 2023-05-16 — End: 2023-05-16
  Administered 2023-05-16: 70 mg via INTRAVENOUS

## 2023-05-16 MED ORDER — ACETAMINOPHEN 325 MG PO TABS
650.0000 mg | ORAL_TABLET | ORAL | Status: DC | PRN
  Administered 2023-05-16: 650 mg via ORAL
  Filled 2023-05-16 (×2): qty 2

## 2023-05-16 MED ORDER — FENTANYL CITRATE (PF) 100 MCG/2ML IJ SOLN
INTRAMUSCULAR | Status: AC
  Filled 2023-05-16: qty 2

## 2023-05-16 MED ORDER — SUGAMMADEX SODIUM 200 MG/2ML IV SOLN
INTRAVENOUS | Status: DC | PRN
  Administered 2023-05-16: 200 mg via INTRAVENOUS

## 2023-05-16 MED ORDER — MIDAZOLAM HCL 2 MG/2ML IJ SOLN
INTRAMUSCULAR | Status: DC | PRN
  Administered 2023-05-16: 2 mg via INTRAVENOUS

## 2023-05-16 MED ORDER — ATROPINE SULFATE 1 MG/ML IV SOLN
INTRAVENOUS | Status: DC | PRN
  Administered 2023-05-16: 1 mg via INTRAVENOUS

## 2023-05-16 MED ORDER — ONDANSETRON HCL 4 MG/2ML IJ SOLN
INTRAMUSCULAR | Status: DC | PRN
  Administered 2023-05-16: 4 mg via INTRAVENOUS

## 2023-05-16 MED ORDER — DEXAMETHASONE SODIUM PHOSPHATE 10 MG/ML IJ SOLN
INTRAMUSCULAR | Status: DC | PRN
  Administered 2023-05-16: 5 mg via INTRAVENOUS

## 2023-05-16 MED ORDER — ONDANSETRON HCL 4 MG/2ML IJ SOLN
4.0000 mg | Freq: Four times a day (QID) | INTRAMUSCULAR | Status: DC | PRN
Start: 2023-05-16 — End: 2023-05-16

## 2023-05-16 MED ORDER — HEPARIN (PORCINE) IN NACL 1000-0.9 UT/500ML-% IV SOLN
INTRAVENOUS | Status: DC | PRN
Start: 2023-05-16 — End: 2023-05-16
  Administered 2023-05-16 (×3): 500 mL

## 2023-05-16 MED ORDER — ACETAMINOPHEN 500 MG PO TABS
1000.0000 mg | ORAL_TABLET | Freq: Once | ORAL | Status: AC
  Administered 2023-05-16: 1000 mg via ORAL
  Filled 2023-05-16: qty 2

## 2023-05-16 MED ORDER — HEPARIN SODIUM (PORCINE) 1000 UNIT/ML IJ SOLN
INTRAMUSCULAR | Status: DC | PRN
  Administered 2023-05-16: 16000 [IU] via INTRAVENOUS

## 2023-05-16 MED ORDER — SODIUM CHLORIDE 0.9 % IV SOLN: INTRAVENOUS | Status: DC

## 2023-05-16 MED ORDER — SODIUM CHLORIDE 0.9% FLUSH: 3.0000 mL | Freq: Two times a day (BID) | INTRAVENOUS | Status: DC

## 2023-05-16 MED ORDER — PROTAMINE SULFATE 10 MG/ML IV SOLN
INTRAVENOUS | Status: DC | PRN
  Administered 2023-05-16: 50 mg via INTRAVENOUS

## 2023-05-16 MED ORDER — MIDAZOLAM HCL 2 MG/2ML IJ SOLN
INTRAMUSCULAR | Status: AC
  Filled 2023-05-16: qty 2

## 2023-05-16 MED ORDER — LIDOCAINE 2% (20 MG/ML) 5 ML SYRINGE
INTRAMUSCULAR | Status: DC | PRN
Start: 2023-05-16 — End: 2023-05-16
  Administered 2023-05-16: 80 mg via INTRAVENOUS

## 2023-05-16 MED ORDER — PROPOFOL 10 MG/ML IV BOLUS
INTRAVENOUS | Status: DC | PRN
  Administered 2023-05-16: 200 mg via INTRAVENOUS

## 2023-05-16 SURGICAL SUPPLY — 21 items
BAG SNAP BAND KOVER 36X36 (MISCELLANEOUS) IMPLANT
CABLE PFA RX CATH CONN (CABLE) IMPLANT
CATH 8FR REPROCESSED SOUNDSTAR (CATHETERS) ×1 IMPLANT
CATH 8FR SOUNDSTAR REPROCESSED (CATHETERS) IMPLANT
CATH FARAWAVE ABLATION 31 (CATHETERS) IMPLANT
CATH OCTARAY 2.0 F 3-3-3-3-3 (CATHETERS) IMPLANT
CATH WEBSTER BI DIR CS D-F CRV (CATHETERS) IMPLANT
CLOSURE PERCLOSE PROSTYLE (VASCULAR PRODUCTS) IMPLANT
COVER SWIFTLINK CONNECTOR (BAG) ×1 IMPLANT
DEVICE CLOSURE MYNXGRIP 6/7F (Vascular Products) IMPLANT
DILATOR VESSEL 38 20CM 16FR (INTRODUCER) IMPLANT
GUIDEWIRE INQWIRE 1.5J.035X260 (WIRE) IMPLANT
INQWIRE 1.5J .035X260CM (WIRE) ×1 IMPLANT
KIT VERSACROSS CNCT FARADRIVE (KITS) IMPLANT
PACK EP LF (CUSTOM PROCEDURE TRAY) ×1 IMPLANT
PAD DEFIB RADIO PHYSIO CONN (PAD) ×1 IMPLANT
PATCH CARTO3 (PAD) IMPLANT
SHEATH FARADRIVE STEERABLE (SHEATH) IMPLANT
SHEATH PINNACLE 8F 10CM (SHEATH) IMPLANT
SHEATH PINNACLE 9F 10CM (SHEATH) IMPLANT
SHEATH PROBE COVER 6X72 (BAG) IMPLANT

## 2023-05-16 NOTE — H&P (Signed)
 Electrophysiology Office Note:    Date:  05/16/2023   ID:  Morgan [name], DOB March 01, 1965, MRN [id number]  PCP:  [name], PA   [facility] Providers Cardiologist:  None Electrophysiologist:  Maurice Small, MD     Referring MD: No ref. provider found   History of Present Illness:    Morgan [name] is a 58 y.o. female with a medical history significant for HTN atrial flutter and fibrillation DM, referred for management of atrial fibrillation flutter.     She is a former patient of Dr. Johney Frame.  ZIO monitor from May 2022 showed atrial fibrillation with a burden of less than 1% and a more regular narrow complex tachycardia thought to be atrial flutter.  There were also episodes of PVCs and bigeminy.  Her symptom episodes did not correlate with atrial fibrillation, however.  She was managed with diltiazem but this was discontinued in March 2024 due to hypotension and bradycardia.  She was on nebivolol but readmitted with A-fib with RVR.  She had increasingly frequent episodes of palpitations.  A monitor was placed that showed sinus rhythm and symptom episodes that correlated with sinus rhythm.  She presented to the ER in January 2 with headache and palpitations.  She had missed her nebivolol and Eliquis that a.m. ECG showed atrial fibrillation with RVR.  She spontaneously converted to sinus rhythm during the ER stay.      Today, she reports that she is well currently.    I reviewed the patient's CT and labs. There was no LAA thrombus. she  has not missed any doses of anticoagulation, and she took her dose last night. There have been no changes in the patient's diagnoses, medications, or condition since our recent clinic visit.   EKGs/Labs/Other Studies Reviewed Today:     Echocardiogram:  TTE 04/28/2022 EF 60-65%. Normal biatrial size.   Monitors:  Zio monitor 13 days 07/2022 -- my interpretation Sinus rhythm heart rate 65-152 beats minute, average 100  bpm Less than 1% burden of atrial fibrillation with average rate of 144 bpm Episodes labeled SVT appear to be flutter or a very rapid atrial tachycardia Multiple symptom episodes correlated with sinus tachycardia, sinus rhythm PVCs and bigeminy.  The AF episode was not correlated with symptoms.  Zio monitor 14 days -- 03/14/2022 Sinus rhythm: HR 45 - 122, average 69 bpm. No atrial fibrillation detected. Rare supraventricular ectopy. Rare ventricular ectopy. No sustained arrhythmias. Symptom trigger episodes correspond to sinus rhythm    EKG:         Physical Exam:    VS:  BP (!) 110/45   Pulse (!) 59   Temp 98.3 F (36.8 C) (Oral)   Resp 16   Ht 5\' 1"  (1.549 m)   Wt 83.9 kg   LMP 05/12/2011   SpO2 97%   BMI 34.96 kg/m     Wt Readings from Last 3 Encounters:  05/16/23 83.9 kg  05/03/23 84.7 kg  04/08/23 85.7 kg     GEN: Well nourished, well developed in no acute distress CARDIAC: RRR, no murmurs, rubs, gallops RESPIRATORY:  Normal work of breathing MUSCULOSKELETAL: no edema    ASSESSMENT & PLAN:     Palpitations She describes recent episodes as forceful beats, sometimes with slow heart rate These correlated with sinus rhythm on her monitor  Paroxysmal atrial fibrillation History of RVR and difficult to control rates Chronic use of Abilify limits and treatment choices ER admit January 2 with AF/RVR; We  discussed management options. Using a shared decision approach, we decided to proceed with ablation.  We discussed the indication, rationale, logistics, anticipated benefits, and potential risks of the ablation procedure including but not limited to -- bleed at the groin access site, chest pain, damage to nearby organs such as the diaphragm, lungs, or esophagus, need for a drainage tube, or prolonged hospitalization. I explained that the risk for stroke, heart attack, need for open chest surgery, or even death is very low but not zero. she  expressed  understanding and wishes to proceed.   Secondary hypercoagulable state Continue apixaban 5 mg twice daily     Signed, Maurice Small, MD  05/16/2023 9:12 AM    [facility]

## 2023-05-16 NOTE — Anesthesia Postprocedure Evaluation (Signed)
 Anesthesia Post Note  Patient: Morgan [name]  Procedure(s) Performed: ATRIAL FIBRILLATION ABLATION     Patient location during evaluation: PACU Anesthesia Type: General Level of consciousness: sedated and patient cooperative Pain management: pain level controlled Vital Signs Assessment: post-procedure vital signs reviewed and stable Respiratory status: spontaneous breathing Cardiovascular status: stable Anesthetic complications: no   There were no known notable events for this encounter.  Last Vitals:  Vitals:   05/16/23 1230 05/16/23 1245  BP: (!) 114/58 (!) 109/55  Pulse: 64 61  Resp: 12 15  Temp:    SpO2: 98% 95%    Last Pain:  Vitals:   05/16/23 1215  TempSrc:   PainSc: 0-No pain                 Lewie Loron

## 2023-05-16 NOTE — Progress Notes (Signed)
 Patient ambulated to BR.  Both groin sites remain unremarkable.

## 2023-05-16 NOTE — Anesthesia Procedure Notes (Cosign Needed)
 Procedure Name: Intubation Date/Time: 05/16/2023 9:38 AM  Performed by: Lianne Cure, RNPre-anesthesia Checklist: Patient identified, Emergency Drugs available, Suction available and Patient being monitored Patient Re-evaluated:Patient Re-evaluated prior to induction Oxygen Delivery Method: Circle system utilized Preoxygenation: Pre-oxygenation with 100% oxygen Induction Type: IV induction Ventilation: Mask ventilation without difficulty Laryngoscope Size: Glidescope and 3 Grade View: Grade I Tube type: Oral Number of attempts: 1 Airway Equipment and Method: Oral airway, Video-laryngoscopy and Rigid stylet Placement Confirmation: ETT inserted through vocal cords under direct vision, positive ETCO2 and breath sounds checked- equal and bilateral Secured at: 22 cm Tube secured with: Tape Dental Injury: Teeth and Oropharynx as per pre-operative assessment  Difficulty Due To: Difficult Airway-  due to neck instability and Difficult Airway- due to limited oral opening Comments: 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

## 2023-05-16 NOTE — Transfer of Care (Cosign Needed)
 Immediate Anesthesia Transfer of Care Note  Patient: Zyaire R Zertuche  Procedure(s) Performed: ATRIAL FIBRILLATION ABLATION  Patient Location: PACU  Anesthesia Type:General  Level of Consciousness: awake and patient cooperative  Airway & Oxygen Therapy: Patient Spontanous Breathing and Patient connected to face mask oxygen  Post-op Assessment: Report given to RN, Post -op Vital signs reviewed and stable, and Patient moving all extremities X 4  Post vital signs: Reviewed and stable  Last Vitals:  Vitals Value Taken Time  BP 122/57 05/16/23 1054  Temp    Pulse 78 05/16/23 1057  Resp 23 05/16/23 1057  SpO2 98 % 05/16/23 1057  Vitals shown include unfiled device data.  Last Pain:  Vitals:   05/16/23 0909  TempSrc:   PainSc: 0-No pain         Complications: There were no known notable events for this encounter.

## 2023-05-16 NOTE — Discharge Instructions (Signed)

## 2023-05-17 ENCOUNTER — Telehealth (HOSPITAL_COMMUNITY): Payer: Self-pay

## 2023-05-17 LAB — POCT ACTIVATED CLOTTING TIME: Activated Clotting Time: 412 s

## 2023-05-17 NOTE — Telephone Encounter (Signed)
 Spoke with patient to complete post procedure follow up call.  Patient reports no complications with groin sites.   Instructions reviewed with patient:  Remove large bandage at puncture site after 24 hours. It is normal to have bruising, tenderness and a pea or marble sized lump/knot at the groin site which can take up to three months to resolve.  Get help right away if you notice sudden swelling at the puncture site.  Check your puncture site every day for signs of infection: fever, redness, swelling, pus drainage, warmth, foul odor or excessive pain. If this occurs, please call the office at [phone], to speak with the nurse. Get help right away if your puncture site is bleeding and the bleeding does not stop after applying firm pressure to the area.  You may continue to have skipped beats/ atrial fibrillation during the first several months after your procedure.  It is very important not to miss any doses of your blood thinner Eliquis. Patient restarted taking this medication on yesterday, 05/16/23.   You will follow up with the Afib clinic on 06/13/23 and follow up with the APP on 08/16/23.   Patient verbalized understanding to all instructions provided.

## 2023-06-01 ENCOUNTER — Encounter: Payer: Self-pay | Admitting: Emergency Medicine

## 2023-06-04 ENCOUNTER — Emergency Department (HOSPITAL_BASED_OUTPATIENT_CLINIC_OR_DEPARTMENT_OTHER)

## 2023-06-04 ENCOUNTER — Emergency Department (HOSPITAL_BASED_OUTPATIENT_CLINIC_OR_DEPARTMENT_OTHER)
Admission: EM | Admit: 2023-06-04 | Discharge: 2023-06-04 | Disposition: A | Discharge status: Home / Self Care | Attending: Emergency Medicine | Admitting: Emergency Medicine

## 2023-06-04 ENCOUNTER — Other Ambulatory Visit: Payer: Self-pay

## 2023-06-04 ENCOUNTER — Encounter (HOSPITAL_BASED_OUTPATIENT_CLINIC_OR_DEPARTMENT_OTHER): Payer: Self-pay | Admitting: Emergency Medicine

## 2023-06-04 DIAGNOSIS — R519 Headache, unspecified: Secondary | ICD-10-CM | POA: Insufficient documentation

## 2023-06-04 DIAGNOSIS — Z7901 Long term (current) use of anticoagulants: Secondary | ICD-10-CM | POA: Diagnosis not present

## 2023-06-04 DIAGNOSIS — R072 Precordial pain: Secondary | ICD-10-CM | POA: Insufficient documentation

## 2023-06-04 DIAGNOSIS — R110 Nausea: Secondary | ICD-10-CM | POA: Diagnosis not present

## 2023-06-04 DIAGNOSIS — R079 Chest pain, unspecified: Secondary | ICD-10-CM

## 2023-06-04 LAB — COMPREHENSIVE METABOLIC PANEL WITH GFR
ALT: 16 U/L (ref 0–44)
AST: 22 U/L (ref 15–41)
Albumin: 4.1 g/dL (ref 3.5–5.0)
Alkaline Phosphatase: 77 U/L (ref 38–126)
Anion gap: 10 (ref 5–15)
BUN: 8 mg/dL (ref 6–20)
CO2: 24 mmol/L (ref 22–32)
Calcium: 9.6 mg/dL (ref 8.9–10.3)
Chloride: 106 mmol/L (ref 98–111)
Creatinine, Ser: 0.69 mg/dL (ref 0.44–1.00)
GFR, Estimated: >60 mL/min (ref >60)
Glucose, Bld: 125 mg/dL — ABNORMAL HIGH (ref 70–99)
Potassium: 3.7 mmol/L (ref 3.5–5.1)
Sodium: 140 mmol/L (ref 135–145)
Total Bilirubin: 0.5 mg/dL (ref 0.0–1.2)
Total Protein: 6.6 g/dL (ref 6.5–8.1)

## 2023-06-04 LAB — CBC WITH DIFFERENTIAL/PLATELET
Abs Immature Granulocytes: 0.03 10*3/uL (ref 0.00–0.07)
Basophils Absolute: 0.1 10*3/uL (ref 0.0–0.1)
Basophils Relative: 1 %
Eosinophils Absolute: 0.2 10*3/uL (ref 0.0–0.5)
Eosinophils Relative: 3 %
HCT: 37.6 % (ref 36.0–46.0)
Hemoglobin: 12.5 g/dL (ref 12.0–15.0)
Immature Granulocytes: 1 %
Lymphocytes Relative: 36 %
Lymphs Abs: 2.1 10*3/uL (ref 0.7–4.0)
MCH: 27.7 pg (ref 26.0–34.0)
MCHC: 33.2 g/dL (ref 30.0–36.0)
MCV: 83.4 fL (ref 80.0–100.0)
Monocytes Absolute: 0.4 10*3/uL (ref 0.1–1.0)
Monocytes Relative: 6 %
Neutro Abs: 3.1 10*3/uL (ref 1.7–7.7)
Neutrophils Relative %: 53 %
Platelets: 186 10*3/uL (ref 150–400)
RBC: 4.51 MIL/uL (ref 3.87–5.11)
RDW: 15.1 % (ref 11.5–15.5)
WBC: 5.9 10*3/uL (ref 4.0–10.5)
nRBC: 0 % (ref 0.0–0.2)

## 2023-06-04 LAB — TROPONIN I (HIGH SENSITIVITY)
Troponin I (High Sensitivity): 11 ng/L (ref <18)
Troponin I (High Sensitivity): 12 ng/L (ref <18)

## 2023-06-04 MED ORDER — SODIUM CHLORIDE 0.9 % IV BOLUS
1000.0000 mL | Freq: Once | INTRAVENOUS | Status: AC
  Administered 2023-06-04: 1000 mL via INTRAVENOUS

## 2023-06-04 MED ORDER — DEXAMETHASONE SODIUM PHOSPHATE 4 MG/ML IJ SOLN
4.0000 mg | Freq: Once | INTRAMUSCULAR | Status: AC
  Administered 2023-06-04: 4 mg via INTRAVENOUS
  Filled 2023-06-04: qty 1

## 2023-06-04 MED ORDER — KETOROLAC TROMETHAMINE 30 MG/ML IJ SOLN
30.0000 mg | Freq: Once | INTRAMUSCULAR | Status: AC
  Administered 2023-06-04: 30 mg via INTRAVENOUS
  Filled 2023-06-04: qty 1

## 2023-06-04 MED ORDER — METOCLOPRAMIDE HCL 10 MG PO TABS: 10.0000 mg | ORAL_TABLET | Freq: Four times a day (QID) | ORAL | 0 refills | Status: DC | PRN

## 2023-06-04 MED ORDER — DIPHENHYDRAMINE HCL 50 MG/ML IJ SOLN
25.0000 mg | Freq: Once | INTRAMUSCULAR | Status: AC
  Administered 2023-06-04: 25 mg via INTRAVENOUS
  Filled 2023-06-04: qty 1

## 2023-06-04 MED ORDER — METOCLOPRAMIDE HCL 5 MG/ML IJ SOLN
10.0000 mg | Freq: Once | INTRAMUSCULAR | Status: AC
  Administered 2023-06-04: 10 mg via INTRAVENOUS
  Filled 2023-06-04: qty 2

## 2023-06-04 NOTE — ED Provider Notes (Signed)
 [place] EMERGENCY DEPARTMENT AT [facility] Provider Note   CSN: [id number] Arrival date & time: 06/04/23  1844     History  Chief Complaint  Patient presents with   Chest Pain    Morgan [name] is a 58 y.o. female history of A-fib status post ablation but still on Eliquis , here presenting with chest pain and headache.  Patient states that she had an ablation done 2 weeks ago.  She states that since then, her heart rate sometimes drops into the 40s.  She sometimes feels lightheaded and dizzy.  She states that around 3 PM today, she had acute onset of headache.  She states that she has some frontal headache and some nausea.  Patient also has substernal chest pain as well.  Patient denies any history of CAD or stents.   The history is provided by the patient.       Home Medications Prior to Admission medications   Medication Sig Start Date End Date Taking? Authorizing Provider  acetaminophen  (TYLENOL ) 500 MG tablet Take 1,000 mg by mouth every 6 (six) hours as needed for mild pain (pain score 1-3).    [provider]  apixaban  (ELIQUIS ) 5 MG TABS tablet TAKE 1 TABLET BY MOUTH TWICE A DAY 11/01/22   Varanasi, Jayadeep S, MD  bismuth subsalicylate (PEPTO BISMOL) 262 MG chewable tablet Chew 524 mg by mouth as needed for diarrhea or loose stools or indigestion.    [provider]  DULoxetine  (CYMBALTA ) 60 MG capsule Take 60 mg by mouth at bedtime. 11/04/16   [provider]  Insulin  Aspart FlexPen (NOVOLOG ) 100 UNIT/ML INJECT 10 UNITS INTO THE SKIN BEFORE BREAKFAST AND LUNCH. INJECT 13 UNITS INTO THE SKIN BEFORE SUPPER. Patient taking differently: Inject 12 Units as directed 3 (three) times daily with meals. 05/03/23   Laneta Pintos, MD  nebivolol  (BYSTOLIC ) 2.5 MG tablet Take 1 tablet (2.5 mg total) by mouth at bedtime. 10/26/22   Swinyer, Leilani Punter, NP  omeprazole  (PRILOSEC  OTC) 20 MG tablet Take 20 mg by mouth at bedtime.    [provider]  oxycodone  (OXY-IR) 5 MG capsule Take 5 mg by mouth every 4 (four) hours as needed for pain.    [provider]  Respiratory Therapy Supplies (FULL KIT NEBULIZER SET) MISC Use as needed for wheezing 04/08/23   Laneta Pintos, MD  REXULTI 2 MG TABS tablet Take 2 mg by mouth daily. 03/24/23   [provider]  rosuvastatin  (CRESTOR ) 10 MG tablet Take 1 tablet (10 mg total) by mouth daily. 04/08/22   Lucendia Rusk, MD  tiZANidine  (ZANAFLEX ) 4 MG tablet Take 4 mg by mouth at bedtime as needed for muscle spasms. 05/14/21   [provider]  XIGDUO  XR 11-998 MG TB24 TAKE 1 TABLET BY MOUTH EVERYDAY AT BEDTIME 04/07/23   Noreene Bearded, PA      Allergies    Clindamycin/lincomycin, Lamictal [lamotrigine], Banana, Lipitor [atorvastatin ], and Pineapple    Review of Systems   Review of Systems  Cardiovascular:  Positive for chest pain.  Neurological:  Positive for dizziness and headaches.  All other systems reviewed and are negative.   Physical Exam Updated Vital Signs BP (!) 152/73   Pulse 67   Temp 98.2 F (36.8 C) (Tympanic)   Resp (!) 23   LMP 05/12/2011   SpO2 96%  Physical Exam Vitals and nursing note reviewed.  Constitutional:      Comments: Uncomfortable   HENT:  Head: Normocephalic.  Eyes:     Extraocular Movements: Extraocular movements intact.     Pupils: Pupils are equal, round, and reactive to light.  Cardiovascular:     Rate and Rhythm: Normal rate and regular rhythm.     Heart sounds: Normal heart sounds.  Pulmonary:     Effort: Pulmonary effort is normal.     Breath sounds: Normal breath sounds.  Abdominal:     General: Bowel sounds are normal.     Palpations: Abdomen is soft.  Musculoskeletal:        General: Normal range of motion.     Cervical back: Normal range of motion and neck supple.  Skin:    General: Skin is warm.     Capillary Refill: Capillary refill takes less than 2 seconds.  Neurological:     General: No  focal deficit present.     Mental Status: She is alert and oriented to person, place, and time.  Psychiatric:        Mood and Affect: Mood normal.        Behavior: Behavior normal.     ED Results / Procedures / Treatments   Labs (all labs ordered are listed, but only abnormal results are displayed) Labs Reviewed  CBC WITH DIFFERENTIAL/PLATELET  COMPREHENSIVE METABOLIC PANEL WITH GFR  TROPONIN I (HIGH SENSITIVITY)    EKG EKG Interpretation Date/Time:  Saturday June 04 2023 18:58:52 EDT Ventricular Rate:  68 PR Interval:  157 QRS Duration:  78 QT Interval:  372 QTC Calculation: 396 R Axis:   45  Text Interpretation: Sinus rhythm No significant change since last tracing Confirmed by Florette Hurry [phone]) on 06/04/2023 7:03:31 PM  Radiology No results found.  Procedures Procedures    Medications Ordered in ED Medications  metoCLOPramide  (REGLAN ) injection 10 mg (has no administration in time range)  diphenhydrAMINE  (BENADRYL ) injection 25 mg (has no administration in time range)  sodium chloride  0.9 % bolus 1,000 mL (has no administration in time range)  dexamethasone  (DECADRON ) injection 4 mg (has no administration in time range)    ED Course/ Medical Decision Making/ A&P                                 Medical Decision Making [name] is a 58 y.o. female here presenting with chest pain and dizziness and headache.  Acute onset of chest pain today.  Patient also has some dizziness and headache.  Patient has nonfocal neuroexam.  Patient was seen here a month ago for similar symptoms but no neuroimaging was performed.  Will get CTA to rule out subarachnoid bleed.  Patient also had ablation done recently.  Patient is back to sinus rhythm.  Will get troponin x 2 to rule out ACS.  I doubt dissection or PE.  9:57 PM Troponin negative x 2.  CBC and CMP unremarkable.  CT head did not show any bleed.  Patient felt better after migraine cocktail.  Patient has cardiology  follow-up.  Will refer to neurology for headaches  Problems Addressed: Chest pain, unspecified type: acute illness or injury Nonintractable headache, unspecified chronicity pattern, unspecified headache type: acute illness or injury  Amount and/or Complexity of Data Reviewed Labs: ordered. Decision-making details documented in ED Course. Radiology: ordered and independent interpretation performed. Decision-making details documented in ED Course. ECG/medicine tests: ordered and independent interpretation performed. Decision-making details documented in ED Course.  Risk Prescription drug management.  Final Clinical Impression(s) / ED Diagnoses Final diagnoses:  None    Rx / DC Orders ED Discharge Orders     None         Dalene Duck, MD 06/04/23 2158

## 2023-06-04 NOTE — ED Triage Notes (Signed)
 Chest pain. Started around 3 pm  Middle chest Sob, dizziness Recent ablation, (3 weeks ago)

## 2023-06-04 NOTE — Discharge Instructions (Addendum)
 As we discussed, your heart enzyme test is normal today  You also have a normal CT head  Please take Reglan  as needed for headaches.  Please call neurology for appointment regarding your headaches  Please follow-up with your cardiologist  Return to ER if you have worse headaches or chest pain

## 2023-06-13 ENCOUNTER — Other Ambulatory Visit (HOSPITAL_COMMUNITY): Payer: Self-pay

## 2023-06-13 ENCOUNTER — Encounter (HOSPITAL_COMMUNITY): Payer: Self-pay | Admitting: Physician Assistant

## 2023-06-13 ENCOUNTER — Ambulatory Visit (HOSPITAL_COMMUNITY)
Admission: RE | Admit: 2023-06-13 | Discharge: 2023-06-13 | Disposition: A | Discharge status: Home / Self Care | Source: Ambulatory Visit | Attending: Physician Assistant | Admitting: Physician Assistant

## 2023-06-13 VITALS — BP 132/90 | HR 67 | Ht 61.0 in | Wt 187.8 lb

## 2023-06-13 DIAGNOSIS — D6869 Other thrombophilia: Secondary | ICD-10-CM

## 2023-06-13 DIAGNOSIS — I480 Paroxysmal atrial fibrillation: Secondary | ICD-10-CM

## 2023-06-13 MED ORDER — FLECAINIDE ACETATE 50 MG PO TABS: 50.0000 mg | ORAL_TABLET | Freq: Two times a day (BID) | ORAL | 2 refills | Status: DC

## 2023-06-13 NOTE — Progress Notes (Signed)
 Primary Care Physician: Noreene Bearded, PA Primary Cardiologist: Dr Jacquelynn Matter  Primary Electrophysiologist: Dr Arlester Ladd  Referring Physician: Dr Coreen Devoid is a 58 y.o. female with a history of HTN, atrial flutter, DM, and atrial fibrillation who presents for follow up in the [facility]. She was seen by him 07/08/20 and complained of intermittent palpitations.  She had a zio monitor placed.  This recorded rare PACs, PVCs as well as afib with burden of <1%.  The longest event was 49 min and 13 seconds.  There was also a more regular arrhythmia which was likely atrial flutter. Seen by Dr Nunzio Belch on 11/03/20 and started on Eliquis  for stroke prevention and diltiazem .  Admitted 04/2022 with hypotension and bradycardia. Diltiazem  discontinued at that time. She has tolerated nebivolol  but presented to the ED 12/06/22 with rapid afib, rates as high as 170 bpm per EMS. She converted en route.   Patient was seen by Dr Arlester Ladd and underwent afib ablation on 05/16/23.   Patient returns for follow up for atrial fibrillation. She reports that since the procedure she has had more frequent tachypalpitations and chest pain. The episodes are occurring several times per week and can last minutes to 2-3 hours. She was seen in the ED 4/19 for similar symptoms, she was in SR by the time she presented. She also notes occasional bradycardia associated with headaches. She denies any groin issues.   Today, she  denies symptoms of shortness of breath, orthopnea, PND, lower extremity edema, dizziness, presyncope, syncope, snoring, daytime somnolence, bleeding, or neurologic sequela. The patient is tolerating medications without difficulties and is otherwise without complaint today.    Atrial Fibrillation Risk Factors:  she does not have symptoms or diagnosis of sleep apnea. she does not have a history of rheumatic fever. she does not have a history of alcohol use.   Atrial  Fibrillation Management history:  Previous antiarrhythmic drugs: none Previous cardioversions: none Previous ablations: 05/16/23 Anticoagulation history: Eliquis    Past Medical History:  Diagnosis Date   Anxiety    Atrial fibrillation (HCC)    Bipolar disorder (HCC)    Breast mass    right, removed and benign   Cancer (HCC) 09/16/2019   ovarian ca no radiation no chemo   Cervical dysplasia    Diabetes mellitus without complication (HCC)    Endometriosis    GERD (gastroesophageal reflux disease)    Hypercholesteremia    Hypertension    Migraine    Ovarian cancer (HCC)    Pelvic kidney    left   Postpartum depression    hx of    Current Outpatient Medications  Medication Sig Dispense Refill   acetaminophen  (TYLENOL ) 500 MG tablet Take 1,000 mg by mouth every 6 (six) hours as needed for mild pain (pain score 1-3).     apixaban  (ELIQUIS ) 5 MG TABS tablet TAKE 1 TABLET BY MOUTH TWICE A DAY 60 tablet 5   bismuth subsalicylate (PEPTO BISMOL) 262 MG chewable tablet Chew 524 mg by mouth as needed for diarrhea or loose stools or indigestion.     DULoxetine  (CYMBALTA ) 60 MG capsule Take 60 mg by mouth at bedtime.  5   Insulin  Aspart FlexPen (NOVOLOG ) 100 UNIT/ML INJECT 10 UNITS INTO THE SKIN BEFORE BREAKFAST AND LUNCH. INJECT 13 UNITS INTO THE SKIN BEFORE SUPPER. (Patient taking differently: Inject 12 Units as directed 3 (three) times daily with meals.) 15 mL 1   loxapine (LOXITANE) 10 MG capsule  Take 10 mg by mouth 2 (two) times daily.     metoCLOPramide  (REGLAN ) 10 MG tablet Take 1 tablet (10 mg total) by mouth every 6 (six) hours as needed for nausea (nausea/headache). 10 tablet 0   nebivolol  (BYSTOLIC ) 2.5 MG tablet Take 1 tablet (2.5 mg total) by mouth at bedtime. 90 tablet 3   nitrofurantoin, macrocrystal-monohydrate, (MACROBID) 100 MG capsule Take 100 mg by mouth 2 (two) times daily.     omeprazole  (PRILOSEC  OTC) 20 MG tablet Take 20 mg by mouth at bedtime.     oxycodone   (OXY-IR) 5 MG capsule Take 5 mg by mouth every 4 (four) hours as needed for pain.     Respiratory Therapy Supplies (FULL KIT NEBULIZER SET) MISC Use as needed for wheezing 1 each 0   rosuvastatin  (CRESTOR ) 10 MG tablet Take 1 tablet (10 mg total) by mouth daily. 90 tablet 3   tiZANidine  (ZANAFLEX ) 4 MG tablet Take 4 mg by mouth at bedtime as needed for muscle spasms.     XIGDUO  XR 11-998 MG TB24 TAKE 1 TABLET BY MOUTH EVERYDAY AT BEDTIME 90 tablet 1   No current facility-administered medications for this encounter.    ROS- All systems are reviewed and negative except as per the HPI above.  Physical Exam: Vitals:   06/13/23 1042  BP: (!) 132/90  Pulse: 67  Weight: 85.2 kg  Height: 5\' 1"  (1.549 m)    GEN: Well nourished, well developed in no acute distress CARDIAC: Regular rate and rhythm, no murmurs, rubs, gallops RESPIRATORY:  Clear to auscultation without rales, wheezing or rhonchi  ABDOMEN: Soft, non-tender, non-distended EXTREMITIES:  No edema; No deformity    Wt Readings from Last 3 Encounters:  06/13/23 85.2 kg  05/16/23 83.9 kg  05/03/23 84.7 kg    EKG today demonstrates  SR Vent. rate 67 BPM PR interval 154 ms QRS duration 80 ms QT/QTcB 384/405 ms   Echo 04/28/22 demonstrated   1. Left ventricular ejection fraction, by estimation, is 60 to 65%. The  left ventricle has normal function. The left ventricle has no regional  wall motion abnormalities. There is mild left ventricular hypertrophy.  Left ventricular diastolic parameters were normal.   2. Right ventricular systolic function is normal. The right ventricular  size is normal. There is normal pulmonary artery systolic pressure. The  estimated right ventricular systolic pressure is 25.5 mmHg.   3. The mitral valve is normal in structure. Trivial mitral valve  regurgitation. No evidence of mitral stenosis.   4. The aortic valve is tricuspid. Aortic valve regurgitation is not  visualized. No aortic stenosis  is present.   5. The inferior vena cava is normal in size with greater than 50%  respiratory variability, suggesting right atrial pressure of 3 mmHg.   Epic records are reviewed at length today  CHA2DS2-VASc Score = 3  The patient's score is based upon: CHF History: 1 HTN History: 0 Diabetes History: 1 Stroke History: 0 Vascular Disease History: 0 Age Score: 0 Gender Score: 1       ASSESSMENT AND PLAN: Paroxysmal Atrial Fibrillation/atrial flutter The patient's CHA2DS2-VASc score is 3, indicating a 3.2% annual risk of stroke.   H/o significant bradycardia requiring ICU admission when on diltiazem .  S/p afib ablation 05/16/23 Patient in SR today but having frequent symptoms. Reassured patient that this is not unusual in the blanking period. Will start flecainide 50 mg BID for rhythm control until she is healed from the ablation. Recheck ECG next week.  Continue nebivolol  2.5 mg daily Continue Eliquis  5 mg BID with no missed doses for 3 months post ablation.   Secondary Hypercoagulable State (ICD10:  D68.69) The patient is at significant risk for stroke/thromboembolism based upon her CHA2DS2-VASc Score of 3.  Continue Apixaban  (Eliquis ). No bleeding issues.   Obesity Body mass index is 35.48 kg/m.  Encouraged lifestyle modification  HTN Stable on current regimen   Follow up in the AF clinic next week and then with Creighton Doffing as scheduled.    Myrtha Ates PA-C [facility] [address] [place], [place] [address] [phone] 06/13/2023 11:11 AM

## 2023-06-20 ENCOUNTER — Ambulatory Visit (HOSPITAL_COMMUNITY)
Admission: RE | Admit: 2023-06-20 | Discharge: 2023-06-20 | Disposition: A | Discharge status: Home / Self Care | Source: Ambulatory Visit | Attending: Physician Assistant | Admitting: Physician Assistant

## 2023-06-20 DIAGNOSIS — Z79899 Other long term (current) drug therapy: Secondary | ICD-10-CM

## 2023-06-20 DIAGNOSIS — I480 Paroxysmal atrial fibrillation: Secondary | ICD-10-CM

## 2023-06-20 DIAGNOSIS — Z5181 Encounter for therapeutic drug level monitoring: Secondary | ICD-10-CM | POA: Diagnosis not present

## 2023-06-20 NOTE — Progress Notes (Signed)
 Patient returns for ECG after flecainide  start. ECG shows:  SR Vent. rate 73 BPM PR interval 174 ms QRS duration 86 ms QT/QTcB 386/425 ms  She reports no further episodes of afib since starting flecainide . Continue flecainide  at present dose. Follow up with Creighton Doffing as scheduled.

## 2023-06-27 ENCOUNTER — Encounter (HOSPITAL_BASED_OUTPATIENT_CLINIC_OR_DEPARTMENT_OTHER): Payer: Self-pay | Admitting: Emergency Medicine

## 2023-06-27 ENCOUNTER — Telehealth: Payer: Self-pay

## 2023-06-27 ENCOUNTER — Emergency Department (HOSPITAL_BASED_OUTPATIENT_CLINIC_OR_DEPARTMENT_OTHER)

## 2023-06-27 ENCOUNTER — Other Ambulatory Visit: Payer: Self-pay

## 2023-06-27 ENCOUNTER — Ambulatory Visit: Payer: Self-pay

## 2023-06-27 ENCOUNTER — Encounter (HOSPITAL_COMMUNITY): Payer: Self-pay

## 2023-06-27 ENCOUNTER — Observation Stay (HOSPITAL_BASED_OUTPATIENT_CLINIC_OR_DEPARTMENT_OTHER)
Admission: EM | Admit: 2023-06-27 | Discharge: 2023-06-28 | Disposition: A | Discharge status: Home / Self Care | Attending: Internal Medicine | Admitting: Internal Medicine

## 2023-06-27 DIAGNOSIS — E119 Type 2 diabetes mellitus without complications: Secondary | ICD-10-CM | POA: Diagnosis not present

## 2023-06-27 DIAGNOSIS — K922 Gastrointestinal hemorrhage, unspecified: Secondary | ICD-10-CM | POA: Diagnosis not present

## 2023-06-27 DIAGNOSIS — R109 Unspecified abdominal pain: Secondary | ICD-10-CM

## 2023-06-27 DIAGNOSIS — I480 Paroxysmal atrial fibrillation: Secondary | ICD-10-CM | POA: Diagnosis not present

## 2023-06-27 DIAGNOSIS — Z794 Long term (current) use of insulin: Secondary | ICD-10-CM | POA: Diagnosis not present

## 2023-06-27 DIAGNOSIS — I10 Essential (primary) hypertension: Secondary | ICD-10-CM | POA: Diagnosis present

## 2023-06-27 DIAGNOSIS — E1169 Type 2 diabetes mellitus with other specified complication: Secondary | ICD-10-CM | POA: Diagnosis present

## 2023-06-27 DIAGNOSIS — Z6834 Body mass index (BMI) 34.0-34.9, adult: Secondary | ICD-10-CM | POA: Insufficient documentation

## 2023-06-27 DIAGNOSIS — E785 Hyperlipidemia, unspecified: Secondary | ICD-10-CM | POA: Insufficient documentation

## 2023-06-27 DIAGNOSIS — F319 Bipolar disorder, unspecified: Secondary | ICD-10-CM | POA: Diagnosis not present

## 2023-06-27 DIAGNOSIS — E66811 Obesity, class 1: Secondary | ICD-10-CM | POA: Diagnosis not present

## 2023-06-27 DIAGNOSIS — Z8719 Personal history of other diseases of the digestive system: Secondary | ICD-10-CM

## 2023-06-27 DIAGNOSIS — K625 Hemorrhage of anus and rectum: Secondary | ICD-10-CM | POA: Diagnosis present

## 2023-06-27 DIAGNOSIS — G8929 Other chronic pain: Secondary | ICD-10-CM | POA: Diagnosis present

## 2023-06-27 DIAGNOSIS — G894 Chronic pain syndrome: Secondary | ICD-10-CM | POA: Diagnosis not present

## 2023-06-27 DIAGNOSIS — K921 Melena: Secondary | ICD-10-CM

## 2023-06-27 LAB — CBC
HCT: 40.7 % (ref 36.0–46.0)
Hemoglobin: 13 g/dL (ref 12.0–15.0)
MCH: 27.2 pg (ref 26.0–34.0)
MCHC: 31.9 g/dL (ref 30.0–36.0)
MCV: 85.1 fL (ref 80.0–100.0)
Platelets: 194 10*3/uL (ref 150–400)
RBC: 4.78 MIL/uL (ref 3.87–5.11)
RDW: 14.6 % (ref 11.5–15.5)
WBC: 6.1 10*3/uL (ref 4.0–10.5)
nRBC: 0 % (ref 0.0–0.2)

## 2023-06-27 LAB — COMPREHENSIVE METABOLIC PANEL WITH GFR
ALT: 32 U/L (ref 0–44)
AST: 55 U/L — ABNORMAL HIGH (ref 15–41)
Albumin: 4.2 g/dL (ref 3.5–5.0)
Alkaline Phosphatase: 114 U/L (ref 38–126)
Anion gap: 12 (ref 5–15)
BUN: 9 mg/dL (ref 6–20)
CO2: 25 mmol/L (ref 22–32)
Calcium: 9.7 mg/dL (ref 8.9–10.3)
Chloride: 101 mmol/L (ref 98–111)
Creatinine, Ser: 0.67 mg/dL (ref 0.44–1.00)
GFR, Estimated: >60 mL/min (ref >60)
Glucose, Bld: 248 mg/dL — ABNORMAL HIGH (ref 70–99)
Potassium: 4 mmol/L (ref 3.5–5.1)
Sodium: 139 mmol/L (ref 135–145)
Total Bilirubin: 0.4 mg/dL (ref 0.0–1.2)
Total Protein: 6.4 g/dL — ABNORMAL LOW (ref 6.5–8.1)

## 2023-06-27 LAB — GLUCOSE, CAPILLARY: Glucose-Capillary: 119 mg/dL — ABNORMAL HIGH (ref 70–99)

## 2023-06-27 LAB — OCCULT BLOOD X 1 CARD TO LAB, STOOL: Fecal Occult Bld: NEGATIVE

## 2023-06-27 MED ORDER — OXYCODONE HCL 5 MG PO TABS: 5.0000 mg | ORAL_TABLET | Freq: Four times a day (QID) | ORAL | Status: DC | PRN

## 2023-06-27 MED ORDER — ACETAMINOPHEN 325 MG PO TABS
650.0000 mg | ORAL_TABLET | Freq: Four times a day (QID) | ORAL | Status: DC | PRN
  Administered 2023-06-27: 650 mg via ORAL
  Filled 2023-06-27: qty 2

## 2023-06-27 MED ORDER — INSULIN ASPART 100 UNIT/ML IJ SOLN: 0.0000 [IU] | Freq: Every day | INTRAMUSCULAR | Status: DC

## 2023-06-27 MED ORDER — ONDANSETRON HCL 4 MG/2ML IJ SOLN
4.0000 mg | Freq: Once | INTRAMUSCULAR | Status: AC
  Administered 2023-06-27: 4 mg via INTRAVENOUS
  Filled 2023-06-27: qty 2

## 2023-06-27 MED ORDER — BISMUTH SUBSALICYLATE 262 MG PO CHEW: 524.0000 mg | CHEWABLE_TABLET | ORAL | Status: DC | PRN

## 2023-06-27 MED ORDER — LACTATED RINGERS IV SOLN: INTRAVENOUS | Status: DC

## 2023-06-27 MED ORDER — METOCLOPRAMIDE HCL 5 MG/ML IJ SOLN
10.0000 mg | Freq: Four times a day (QID) | INTRAMUSCULAR | Status: DC | PRN
  Administered 2023-06-27: 10 mg via INTRAVENOUS
  Filled 2023-06-27: qty 2

## 2023-06-27 MED ORDER — ACETAMINOPHEN 650 MG RE SUPP: 650.0000 mg | Freq: Four times a day (QID) | RECTAL | Status: DC | PRN

## 2023-06-27 MED ORDER — SODIUM CHLORIDE 0.9% FLUSH
3.0000 mL | Freq: Two times a day (BID) | INTRAVENOUS | Status: DC
  Administered 2023-06-27 – 2023-06-28 (×2): 3 mL via INTRAVENOUS

## 2023-06-27 MED ORDER — DICYCLOMINE HCL 10 MG/ML IM SOLN
20.0000 mg | Freq: Once | INTRAMUSCULAR | Status: AC
  Administered 2023-06-27: 20 mg via INTRAMUSCULAR
  Filled 2023-06-27: qty 2

## 2023-06-27 MED ORDER — LACTATED RINGERS IV BOLUS
1000.0000 mL | Freq: Once | INTRAVENOUS | Status: AC
  Administered 2023-06-27: 1000 mL via INTRAVENOUS

## 2023-06-27 MED ORDER — SODIUM CHLORIDE 0.9 % IV SOLN: 250.0000 mL | INTRAVENOUS | Status: DC | PRN

## 2023-06-27 MED ORDER — IOHEXOL 350 MG/ML SOLN
100.0000 mL | Freq: Once | INTRAVENOUS | Status: AC | PRN
  Administered 2023-06-27: 100 mL via INTRAVENOUS

## 2023-06-27 MED ORDER — ONDANSETRON HCL 4 MG/2ML IJ SOLN: 4.0000 mg | Freq: Four times a day (QID) | INTRAMUSCULAR | Status: DC | PRN

## 2023-06-27 MED ORDER — PANTOPRAZOLE SODIUM 40 MG IV SOLR
40.0000 mg | Freq: Two times a day (BID) | INTRAVENOUS | Status: DC
  Administered 2023-06-27 – 2023-06-28 (×2): 40 mg via INTRAVENOUS
  Filled 2023-06-27 (×2): qty 10

## 2023-06-27 MED ORDER — INSULIN ASPART 100 UNIT/ML IJ SOLN: 0.0000 [IU] | Freq: Three times a day (TID) | INTRAMUSCULAR | Status: DC

## 2023-06-27 MED ORDER — ONDANSETRON HCL 4 MG PO TABS: 4.0000 mg | ORAL_TABLET | Freq: Four times a day (QID) | ORAL | Status: DC | PRN

## 2023-06-27 MED ORDER — SODIUM CHLORIDE 0.9% FLUSH: 3.0000 mL | INTRAVENOUS | Status: DC | PRN

## 2023-06-27 MED ORDER — SODIUM CHLORIDE 0.9% FLUSH: 3.0000 mL | Freq: Two times a day (BID) | INTRAVENOUS | Status: DC

## 2023-06-27 MED ORDER — PANTOPRAZOLE SODIUM 40 MG IV SOLR
80.0000 mg | Freq: Once | INTRAVENOUS | Status: AC
  Administered 2023-06-27: 80 mg via INTRAVENOUS
  Filled 2023-06-27: qty 20

## 2023-06-27 NOTE — Telephone Encounter (Signed)
 Pt left a vm stating that when she had a bowel movement yesterday, her stools were black. There was blood in the toilet and when she wiped. She would like advise on what to do. She would like an appointment if possible.

## 2023-06-27 NOTE — Telephone Encounter (Signed)
 Dr. Antony Baumgartner, I called patient back and she basically stated the below information to me again. Any recommendations?

## 2023-06-27 NOTE — Telephone Encounter (Signed)
 Copied from CRM [id number]. Topic: Clinical - Red Word Triage >> Jun 27, 2023  9:21 AM Rosamond Comes wrote: Red Word that prompted transfer to Nurse Triage: patient calling in, yesterday afternoon had bowel movement, blood turned the water pink, could see the blood on stool, stool color black, now has upper abdominal pain maybe 5,  nausea, tiredness came on all of the sudden  Chief Complaint: Blood in stool Symptoms: Blood in toilet water, black stool, abdominal pain, fatigue Frequency: Since Friday  Pertinent Negatives: Patient denies diarrhea/constipation Disposition: [x] ED /[] Urgent Care (no appt availability in office) / [] Appointment(In office/virtual)/ []  [facility]/ [] Home Care/ [] Refused Recommended Disposition /[] [facility]/ []  Follow-up with PCP Additional Notes: Patient called in to report rectal bleeding and black stools. Patient stated she first noticed blood in the toilet bowel on Friday. Patient stated she noticed even more blood in the toilet bowel on Sunday, as well as stools that were completely black. Patient is experiencing abdominal pain and fatigue at this time. Patient is currently taking Eliquis . Advised ED, per protocol. Patient complied and stated her husband would take her. Provided care advice and instructed patient to call back if symptoms worsen. Patient complied.   Reason for Disposition  Black or tarry bowel movements  (Exception: Chronic-unchanged black-grey BMs AND is taking iron pills or Pepto-Bismol.)  Answer Assessment - Initial Assessment Questions 1. APPEARANCE of BLOOD: "What color is it?" "Is it passed separately, on the surface of the stool, or mixed in with the stool?"      States toilet water was bright red on Friday, stool was "very black" yesterday and more blood in toilet water than Friday  2. AMOUNT: "How much blood was passed?"      1-2 Tbsp 3. FREQUENCY: "How many times has blood been passed with the stools?"      2 x 4. ONSET:  "When was the blood first seen in the stools?" (Days or weeks)      Friday 5. DIARRHEA: "Is there also some diarrhea?" If Yes, ask: "How many diarrhea stools in the past 24 hours?"      Denies 6. CONSTIPATION: "Do you have constipation?" If Yes, ask: "How bad is it?"     Denies 7. RECURRENT SYMPTOMS: "Have you had blood in your stools before?" If Yes, ask: "When was the last time?" and "What happened that time?"      Denies 8. BLOOD THINNERS: "Do you take any blood thinners?" (e.g., Coumadin/warfarin, Pradaxa/dabigatran, aspirin )     Taking Eliquis   9. OTHER SYMPTOMS: "Do you have any other symptoms?"  (e.g., abdomen pain, vomiting, dizziness, fever)     Upper abdominal pain- rates 4-5, feels achy Nausea, extreme fatigue  Protocols used: Rectal Bleeding-A-AH

## 2023-06-27 NOTE — ED Provider Notes (Signed)
 [place] EMERGENCY DEPARTMENT AT [facility] Provider Note   CSN: [id number] Arrival date & time: 06/27/23  1108     History {Add pertinent medical, surgical, social history, OB history to HPI:1} Chief Complaint  Patient presents with   Rectal Bleeding    Morgan [name] is a 58 y.o. female.  HPI      Friday went to bathroom, had BM, there was blood in the toilet. Went again Sunday afternoon, stool was black and blood in toilet Having abdominal pain, nausea, fatigue No vomiting No fever Eliquis  , last dose this AM  Dr. Lindaann Requena at [facility] Called PCP who said to come here   Home Medications Prior to Admission medications   Medication Sig Start Date End Date Taking? Authorizing Provider  acetaminophen  (TYLENOL ) 500 MG tablet Take 1,000 mg by mouth every 6 (six) hours as needed for mild pain (pain score 1-3).    [provider]  apixaban  (ELIQUIS ) 5 MG TABS tablet TAKE 1 TABLET BY MOUTH TWICE A DAY 11/01/22   Varanasi, Jayadeep S, MD  bismuth subsalicylate (PEPTO BISMOL) 262 MG chewable tablet Chew 524 mg by mouth as needed for diarrhea or loose stools or indigestion.    [provider]  DULoxetine  (CYMBALTA ) 60 MG capsule Take 60 mg by mouth at bedtime. 11/04/16   [provider]  flecainide  (TAMBOCOR ) 50 MG tablet Take 1 tablet (50 mg total) by mouth 2 (two) times daily. 06/13/23   Fenton, Clint R, PA  Insulin  Aspart FlexPen (NOVOLOG ) 100 UNIT/ML INJECT 10 UNITS INTO THE SKIN BEFORE BREAKFAST AND LUNCH. INJECT 13 UNITS INTO THE SKIN BEFORE SUPPER. Patient taking differently: Inject 12 Units as directed 3 (three) times daily with meals. 05/03/23   Laneta Pintos, MD  loxapine (LOXITANE) 10 MG capsule Take 10 mg by mouth 2 (two) times daily. 06/08/23   [provider]  metoCLOPramide  (REGLAN ) 10 MG tablet Take 1 tablet (10 mg total) by mouth every 6 (six) hours as needed for nausea (nausea/headache). 06/04/23   Dalene Duck, MD   nebivolol  (BYSTOLIC ) 2.5 MG tablet Take 1 tablet (2.5 mg total) by mouth at bedtime. 10/26/22   Swinyer, Leilani Punter, NP  omeprazole  (PRILOSEC  OTC) 20 MG tablet Take 20 mg by mouth at bedtime.    [provider]  oxycodone  (OXY-IR) 5 MG capsule Take 5 mg by mouth every 4 (four) hours as needed for pain.    [provider]  Respiratory Therapy Supplies (FULL KIT NEBULIZER SET) MISC Use as needed for wheezing 04/08/23   Laneta Pintos, MD  rosuvastatin  (CRESTOR ) 10 MG tablet Take 1 tablet (10 mg total) by mouth daily. 04/08/22   Lucendia Rusk, MD  tiZANidine  (ZANAFLEX ) 4 MG tablet Take 4 mg by mouth at bedtime as needed for muscle spasms. 05/14/21   [provider]  XIGDUO  XR 11-998 MG TB24 TAKE 1 TABLET BY MOUTH EVERYDAY AT BEDTIME 04/07/23   Maryclare Smoke A, PA      Allergies    Clindamycin/lincomycin, Lamictal [lamotrigine], Banana, Lipitor [atorvastatin ], and Pineapple    Review of Systems   Review of Systems  Physical Exam Updated Vital Signs BP (!) 142/68 (BP Location: Right Arm)   Pulse 66   Temp 98 F (36.7 C)   Resp 16   Ht 5\' 1"  (1.549 m)   Wt 83.9 kg   LMP 05/12/2011   SpO2 99%   BMI 34.96 kg/m  Physical Exam  ED Results / Procedures /  Treatments   Labs (all labs ordered are listed, but only abnormal results are displayed) Labs Reviewed  COMPREHENSIVE METABOLIC PANEL WITH GFR - Abnormal; Notable for the following components:      Result Value   Glucose, Bld 248 (*)    Total Protein 6.4 (*)    AST 55 (*)    All other components within normal limits  CBC    EKG None  Radiology No results found.  Procedures Procedures  {Document cardiac monitor, telemetry assessment procedure when appropriate:1}  Medications Ordered in ED Medications - No data to display  ED Course/ Medical Decision Making/ A&P   {   Click here for ABCD2, HEART and other calculatorsREFRESH Note before signing :1}                                ***    Will admit with concern for GI bleed on eliquis . Rectal exam without stool at this time so suspect false negative.  History unclear if lower vs upper GI bleed given melena as well as bright red stool.  Given description as melena, will give protonix  bolus, plan to admit for observation in setting of GI bleed on anticoagulation.  CT pending to evaluate for source of bleeding or pain.   Sent message to Mai Schwalbe NP [facility].    {Document critical care time when appropriate:1} {Document review of labs and clinical decision tools ie heart score, Chads2Vasc2 etc:1}  {Document your independent review of radiology images, and any outside records:1} {Document your discussion with family members, caretakers, and with consultants:1} {Document social determinants of health affecting pt's care:1} {Document your decision making why or why not admission, treatments were needed:1} Final Clinical Impression(s) / ED Diagnoses Final diagnoses:  None    Rx / DC Orders ED Discharge Orders     None

## 2023-06-27 NOTE — H&P (Signed)
 History and Physical    [name]:[id number] DOB: 1965/09/01 DOA: 06/27/2023  PCP: Noreene Bearded, PA   Patient coming from: Home   Chief Complaint:  Chief Complaint  Patient presents with   Rectal Bleeding   ED TRIAGE note:  Pt caox4, ambulatory, NAD c/o rectal bleeding since Friday, 2 episodes reporting first contained bright red blood in the BM, second had bright red blood with black stool. Pt also c/o abd discomfort and nausea. Pt does take Eliquis .     HPI:  Morgan [name] is a 58 y.o. female with medical history significant of atrial fibrillation on Eliquis , essential hypertension, DM type II presented to emergency department complaining of GI bleed.  Patient reported patient has 2 episodes of bright red bowel movement and third episode was red blood with darker stool.  Bleeding started since Friday.   Patient reported left lower quadrant abdominal pain that started today afternoon.  Last episode of black tarry stool 5/11 at 2 PM.  Denies any Szo, vomiting and heartburn.  No previous history of GI bleed in the past.  Patient denies any use of ibuprofen , BC/Goody powder.  Patient has EGD in 3/ 2020 which showed gastritis and colonoscopy 04/2018 showed duodenal polyp.  Accepting physician requested to ED to consult GI and discuss care.  Pending documentation/note of ED physician.  At presentation to ED patient is hemodynamically stable.  FOBT negative. CBC showing stable H&H 13 and 40.  Normal WBC platelet count. CMP unremarkable except elevated AST 55.  In the ED patient has been treated with 1 L of LR bolus, IV Protonix  40 mg  Patient has been transferred to [facility] for management of GI bleed.   Significant labs in the ED: Lab Orders         Comprehensive metabolic panel         CBC         Occult blood card to lab, stool         HIV Antibody (routine testing w rflx)         Comprehensive metabolic panel         CBC         Protime-INR          Hemoglobin and hematocrit, blood         Occult blood card to lab, stool         Occult blood card to lab, stool       Review of Systems:  Review of Systems  Constitutional:  Negative for chills, fever, malaise/fatigue and weight loss.  Respiratory:  Negative for cough, sputum production and shortness of breath.   Cardiovascular:  Negative for chest pain, palpitations, orthopnea and leg swelling.  Gastrointestinal:  Positive for abdominal pain, blood in stool and melena. Negative for constipation, diarrhea, heartburn, nausea and vomiting.  Genitourinary:  Negative for dysuria and urgency.  Musculoskeletal:  Negative for back pain, joint pain and myalgias.  Neurological:  Negative for dizziness and headaches.  Psychiatric/Behavioral:  The patient is not nervous/anxious.     Past Medical History:  Diagnosis Date   Anxiety    Atrial fibrillation (HCC)    Bipolar disorder (HCC)    Breast mass    right, removed and benign   Cancer (HCC) 09/16/2019   ovarian ca no radiation no chemo   Cervical dysplasia    Diabetes mellitus without complication (HCC)    Endometriosis    GERD (gastroesophageal reflux disease)  Hypercholesteremia    Hypertension    Migraine    Ovarian cancer (HCC)    Pelvic kidney    left   Postpartum depression    hx of    Past Surgical History:  Procedure Laterality Date   ATRIAL FIBRILLATION ABLATION N/A 05/16/2023   Procedure: ATRIAL FIBRILLATION ABLATION;  Surgeon: Efraim Grange, MD;  Location: [facility];  Service: Cardiovascular;  Laterality: N/A;   BACK SURGERY  02/16/2012   BREAST BIOPSY Right 2016   BREAST BIOPSY Right 2020   BREAST EXCISIONAL BIOPSY Right 2021   BREAST EXCISIONAL BIOPSY Right 2016   benign   BREAST LUMPECTOMY WITH RADIOACTIVE SEED LOCALIZATION Right 10/28/2014   Procedure: RIGHT BREAST LUMPECTOMY WITH RADIOACTIVE SEED LOCALIZATION;  Surgeon: Lillette Reid III, MD;  Location: [facility];  Service:  General;  Laterality: Right;   BREAST LUMPECTOMY WITH RADIOACTIVE SEED LOCALIZATION Right 06/18/2019   Procedure: RIGHT BREAST RADIOACTIVE SEED LOCALIZATION LUMPECTOMY;  Surgeon: Caralyn Chandler, MD;  Location: [facility];  Service: General;  Laterality: Right;   BREAST SURGERY N/A    Phreesia 07/30/2019   CESAREAN SECTION  02/15/2001   COLONOSCOPY WITH PROPOFOL  N/A 05/01/2018   Procedure: COLONOSCOPY WITH PROPOFOL ;  Surgeon: Luke Salaam, MD;  Location: [facility];  Service: Gastroenterology;  Laterality: N/A;   COLPOSCOPY     ESOPHAGOGASTRODUODENOSCOPY (EGD) WITH PROPOFOL  N/A 05/01/2018   Procedure: ESOPHAGOGASTRODUODENOSCOPY (EGD) WITH PROPOFOL ;  Surgeon: Luke Salaam, MD;  Location: [facility] END[facility]COPY;  Service: Gastroenterology;  Laterality: N/A;   ESOPHAGOGASTRODUODENOSCOPY (EGD) WITH PROPOFOL  N/A 11/13/2018   Procedure: ESOPHAGOGASTRODUODENOSCOPY (EGD) WITH PROPOFOL ;  Surgeon: Luke Salaam, MD;  Location: [facility] ENDOSCOPY;  Service: Gastroenterology;  Laterality: N/A;   GYNECOLOGIC CRYOSURGERY     NECK SURGERY  02/15/2010   PELVIC LAPAROSCOPY  02/15/1989   TOTAL ABDOMINAL HYSTERECTOMY N/A    Phreesia 07/30/2019     reports that she has never smoked. She has never been exposed to tobacco smoke. She has never used smokeless tobacco. She reports that she does not drink alcohol and does not use drugs.  Allergies  Allergen Reactions   Clindamycin/Lincomycin Rash    Diffuse drug reaction eruption   Lamictal [Lamotrigine] Rash   Banana Diarrhea   Lipitor [Atorvastatin ] Other (See Comments)    Severe fatigue   Pineapple Diarrhea    Family History  Problem Relation Age of Onset   Diabetes Mother    Breast cancer Mother 61   Thyroid  disease Mother    Depression Mother    Hypertension Father    Heart disease Father    Heart attack Father 95       Two instances, first at age 49   Stroke Father    Alcohol abuse Father    Throat cancer Father    Diabetes Maternal  Grandmother    Depression Maternal Grandmother    Uterine cancer Maternal Grandmother     Prior to Admission medications   Medication Sig Start Date End Date Taking? Authorizing Provider  acetaminophen  (TYLENOL ) 500 MG tablet Take 1,000 mg by mouth every 6 (six) hours as needed for mild pain (pain score 1-3).    [provider]  apixaban  (ELIQUIS ) 5 MG TABS tablet TAKE 1 TABLET BY MOUTH TWICE A DAY 11/01/22   Varanasi, Jayadeep S, MD  bismuth subsalicylate (PEPTO BISMOL) 262 MG chewable tablet Chew 524 mg by mouth as needed for diarrhea or loose stools or indigestion.    [provider]  DULoxetine  (  CYMBALTA ) 60 MG capsule Take 60 mg by mouth at bedtime. 11/04/16   [provider]  flecainide  (TAMBOCOR ) 50 MG tablet Take 1 tablet (50 mg total) by mouth 2 (two) times daily. 06/13/23   Fenton, Clint R, PA  Insulin  Aspart FlexPen (NOVOLOG ) 100 UNIT/ML INJECT 10 UNITS INTO THE SKIN BEFORE BREAKFAST AND LUNCH. INJECT 13 UNITS INTO THE SKIN BEFORE SUPPER. Patient taking differently: Inject 12 Units as directed 3 (three) times daily with meals. 05/03/23   Laneta Pintos, MD  loxapine (LOXITANE) 10 MG capsule Take 10 mg by mouth 2 (two) times daily. 06/08/23   [provider]  metoCLOPramide  (REGLAN ) 10 MG tablet Take 1 tablet (10 mg total) by mouth every 6 (six) hours as needed for nausea (nausea/headache). 06/04/23   Dalene Duck, MD  nebivolol  (BYSTOLIC ) 2.5 MG tablet Take 1 tablet (2.5 mg total) by mouth at bedtime. 10/26/22   Swinyer, Leilani Punter, NP  omeprazole  (PRILOSEC  OTC) 20 MG tablet Take 20 mg by mouth at bedtime.    [provider]  oxycodone  (OXY-IR) 5 MG capsule Take 5 mg by mouth every 4 (four) hours as needed for pain.    [provider]  Respiratory Therapy Supplies (FULL KIT NEBULIZER SET) MISC Use as needed for wheezing 04/08/23   Laneta Pintos, MD  rosuvastatin  (CRESTOR ) 10 MG tablet Take 1 tablet (10 mg total) by mouth daily.  04/08/22   Lucendia Rusk, MD  tiZANidine  (ZANAFLEX ) 4 MG tablet Take 4 mg by mouth at bedtime as needed for muscle spasms. 05/14/21   [provider]  XIGDUO  XR 11-998 MG TB24 TAKE 1 TABLET BY MOUTH EVERYDAY AT BEDTIME 04/07/23   Noreene Bearded, [place]     Physical Exam: Vitals:   06/27/23 1536 06/27/23 1941 06/27/23 2038 06/27/23 2040  BP:  (!) 112/91  122/75  Pulse:  74  66  Resp:  20 15 18   Temp: 98.3 F (36.8 C) 98.4 F (36.9 C)  98.4 F (36.9 C)  TempSrc:  Oral    SpO2:  98%  98%  Weight:      Height:        Physical Exam Vitals and nursing note reviewed.  HENT:     Mouth/Throat:     Mouth: Mucous membranes are moist.  Eyes:     Pupils: Pupils are equal, round, and reactive to light.  Cardiovascular:     Rate and Rhythm: Normal rate and regular rhythm.     Pulses: Normal pulses.     Heart sounds: Normal heart sounds.  Pulmonary:     Effort: Pulmonary effort is normal.     Breath sounds: Normal breath sounds.  Abdominal:     General: Bowel sounds are normal.     Tenderness: There is no abdominal tenderness. There is no guarding or rebound.  Musculoskeletal:     Cervical back: Neck supple.     Right lower leg: No edema.     Left lower leg: No edema.  Skin:    Capillary Refill: Capillary refill takes less than 2 seconds.  Neurological:     Mental Status: She is alert and oriented to person, place, and time.  Psychiatric:        Mood and Affect: Mood normal.        Thought Content: Thought content normal.      Labs on Admission: I have personally reviewed following labs and imaging studies  CBC: Recent Labs  Lab 06/27/23 1127  WBC 6.1  HGB 13.0  HCT 40.7  MCV 85.1  PLT 194   Basic Metabolic Panel: Recent Labs  Lab 06/27/23 1127  NA 139  K 4.0  CL 101  CO2 25  GLUCOSE 248*  BUN 9  CREATININE 0.67  CALCIUM  9.7   GFR: Estimated Creatinine Clearance: 78 mL/min (by C-G formula based on SCr of 0.67 mg/dL). Liver Function  Tests: Recent Labs  Lab 06/27/23 1127  AST 55*  ALT 32  ALKPHOS 114  BILITOT 0.4  PROT 6.4*  ALBUMIN  4.2   No results for input(s): "LIPASE", "AMYLASE" in the last 168 hours. No results for input(s): "AMMONIA" in the last 168 hours. Coagulation Profile: No results for input(s): "INR", "PROTIME" in the last 168 hours. Cardiac Enzymes: No results for input(s): "CKTOTAL", "CKMB", "CKMBINDEX", "TROPONINI", "TROPONINIHS" in the last 168 hours. BNP (last 3 results) No results for input(s): "BNP" in the last 8760 hours. HbA1C: No results for input(s): "HGBA1C" in the last 72 hours. CBG: No results for input(s): "GLUCAP" in the last 168 hours. Lipid Profile: No results for input(s): "CHOL", "HDL", "LDLCALC", "TRIG", "CHOLHDL", "LDLDIRECT" in the last 72 hours. Thyroid  Function Tests: No results for input(s): "TSH", "T4TOTAL", "FREET4", "T3FREE", "THYROIDAB" in the last 72 hours. Anemia Panel: No results for input(s): "VITAMINB12", "FOLATE", "FERRITIN", "TIBC", "IRON", "RETICCTPCT" in the last 72 hours. Urine analysis:    Component Value Date/Time   COLORURINE YELLOW 03/31/2023 0906   APPEARANCEUR CLEAR 03/31/2023 0906   APPEARANCEUR Hazy 05/31/2014 1356   LABSPEC 1.010 03/31/2023 0906   LABSPEC 1.011 05/31/2014 1356   PHURINE 5.0 03/31/2023 0906   GLUCOSEU 50 (A) 03/31/2023 0906   GLUCOSEU Negative 05/31/2014 1356   HGBUR NEGATIVE 03/31/2023 0906   BILIRUBINUR NEGATIVE 03/31/2023 0906   BILIRUBINUR negative 12/13/2022 1135   BILIRUBINUR Negative 06/23/2022 0949   BILIRUBINUR Negative 05/31/2014 1356   KETONESUR NEGATIVE 03/31/2023 0906   PROTEINUR NEGATIVE 03/31/2023 0906   UROBILINOGEN 0.2 12/13/2022 1135   UROBILINOGEN 0.2 12/01/2012 1539   NITRITE NEGATIVE 03/31/2023 0906   LEUKOCYTESUR NEGATIVE 03/31/2023 0906   LEUKOCYTESUR Trace 05/31/2014 1356    Radiological Exams on Admission: I have personally reviewed images CT Angio Abd/Pel W and/or Wo Contrast Result Date:  06/27/2023 CLINICAL DATA:  Lower GI bleed. EXAM: CTA ABDOMEN AND PELVIS WITHOUT AND WITH CONTRAST TECHNIQUE: Multidetector CT imaging of the abdomen and pelvis was performed using the standard protocol during bolus administration of intravenous contrast. Multiplanar reconstructed images and MIPs were obtained and reviewed to evaluate the vascular anatomy. RADIATION DOSE REDUCTION: This exam was performed according to the departmental dose-optimization program which includes automated exposure control, adjustment of the mA and/or kV according to patient size and/or use of iterative reconstruction technique. CONTRAST:  OMNIPAQUE  IOHEXOL  350 MG/ML SOLN COMPARISON:  Chest CTA 04/17/2023 FINDINGS: VASCULAR Aorta: Normal caliber aorta without aneurysm, dissection, vasculitis or significant stenosis. Minor atherosclerosis. Celiac: Patent without evidence of aneurysm, dissection, vasculitis or significant stenosis. SMA: Patent without evidence of aneurysm, dissection, vasculitis or significant stenosis. Renals: 2 right renal arteries. Single left renal artery arises from the distal aorta, the left kidney is ectopically located in the pelvis. All renal arteries are patent without evidence of aneurysm, dissection, vasculitis, fibromuscular dysplasia or significant stenosis. IMA: Patent without evidence of aneurysm, dissection, vasculitis or significant stenosis. Inflow: Patent without evidence of aneurysm, dissection, vasculitis or significant stenosis. Proximal Outflow: Bilateral common femoral and visualized portions of the superficial and profunda femoral arteries are patent without evidence of  aneurysm, dissection, vasculitis or significant stenosis. Veins: Venous phase imaging demonstrates patency of the venous structures. Particularly, the portal, splenic, and mesenteric veins are patent. Review of the MIP images confirms the above findings. NON-VASCULAR Lower chest: Mild heterogeneous pulmonary parenchyma. No  basilar airspace disease or pleural effusion. Hepatobiliary: Diffuse hepatic steatosis. Stable enhancing lesion in the periphery of the right lobe of the liver measuring 18 mm characterized as hemangioma. No new hepatic abnormality. Gallbladder physiologically distended, no calcified stone. No biliary dilatation. Pancreas: Fatty atrophy.  No ductal dilatation or inflammation. Spleen: Normal in size without focal abnormality. Adrenals/Urinary Tract: Normal adrenal glands. The left kidney is ectopically located in the pelvis. No hydronephrosis or renal inflammation. No renal calculi. Partially distended urinary bladder, normal for degree of distension. Stomach/Bowel: There is no contrast accumulating in the GI tract to localize site of GI bleed. Moderate diffuse colonic stool burden with sigmoid colonic redundancy. No bowel wall thickening or inflammatory change. The appendix is not well seen on the current exam Lymphatic: No enlarged or suspicious lymph nodes in the abdomen or pelvis. Reproductive: Status post hysterectomy. No adnexal masses. Other: No free air, free fluid, or intra-abdominal fluid collection. Diminutive fat containing umbilical hernia. Musculoskeletal: Posterior L4-L5 fusion hardware. Stable sclerotic focus within L2. There are no acute or suspicious osseous abnormalities. IMPRESSION: 1. No contrast accumulating in the GI tract to localize site of GI bleed. 2. No acute abnormality in the abdomen/pelvis. 3. Hepatic steatosis. Stable hemangioma in the right lobe of the liver. 4. 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 Aortic Atherosclerosis (ICD10-I70.0). Electronically Signed   By: Chadwick Colonel M.D.   On: 06/27/2023 16:47     EKG: Pending EKG  Assessment/Plan: Principal Problem:   GI bleed Active Problems:   Bipolar disorder (HCC)   Type 2 diabetes mellitus with other specified complication (HCC)   Chronic pain syndrome   Paroxysmal atrial fibrillation (HCC)   Essential  hypertension   Chronic abdominal pain   History of gastritis    Assessment and Plan: GI bleed History of gastritis and duodenal polyp Chronic abdominal pain -Patient presented emergency department complaining of GI bleed that is started since Friday.  Initially she has bright red blood per rectum afterwards she developed 1 episode of darker stool/melena. - At presentation to ED patient is hemodynamically stable.  CBC showing stable H&H 13 and 40.  Normal platelet and WBC count. 1st FOBT negative.  Need to do at least 2 more FOBT check to confirm. - Per chart review patient has history of gastritis, [place]onato polyp, chronic abdominal pain possibly from underlying gastritis. -In the ED patient has been treated with IV Protonix  80 mg and 1 L of LR bolus. - Consulted and have sent secure chat message to [facility] Dr. Honey Lusty to evaluate in the daytime. - Patient is hemodynamically stable. -Checking type and screen.  Repeat H&H at midnight. - Continue IV Protonix  40 mg twice daily  -Continue clear liquid diet in case patient will undergo EGD.   Chronic abdominal pain and nausea - Continue Reglan  as needed  Chronic pain syndrome - Continue oxycodone  as needed   Essential hypertension -Blood pressure within good range.  Waiting for pharmacy verification for home blood pressure regimen  Paroxysmal atrial fibrillation on Eliquis  - Pending EKG.  Holding Eliquis  in the setting of GI bleed.    Essential hypertension Hyperlipidemia Bipolar disorder Pending pharmacy reconciliation of medication  Insulin -dependent DM type II -Continue sliding scale insulin  with meal  DVT prophylaxis:  SCDs.  Hold any pharmacologic DVT prophylaxis in setting of GI bleed Code Status:  Full Code Diet: Clear liquid diet Family Communication:   Family was present at bedside, at the time of interview. Opportunity was given to ask question and all questions were answered satisfactorily.  Disposition Plan:  Continue monitor any development of GI bleed.  Will transfuse if hemoglobin drops very quickly.  Pending GI evaluation Consults: [facility] Dr. Honey Lusty Admission status:   Observation, progressive unit  Severity of Illness: The appropriate patient status for this patient is OBSERVATION. Observation status is judged to be reasonable and necessary in order to provide the required intensity of service to ensure the patient's safety. The patient's presenting symptoms, physical exam findings, and initial radiographic and laboratory data in the context of their medical condition is felt to place them at decreased risk for further clinical deterioration. Furthermore, it is anticipated that the patient will be medically stable for discharge from the hospital within 2 midnights of admission.     Helix Lafontaine, MD [facility]  How to contact the [facility] Attending or Consulting provider 7A - 7P or covering provider during after hours 7P -7A, for this patient.  Check the care team in [facility] and look for a) attending/consulting [facility] provider listed and b) the [facility] team listed Log into [email or web address] and use [place]'s universal password to access. If you do not have the password, please contact the hospital operator. Locate the [facility] provider you are looking for under [facility] and page to a number that you can be directly reached. If you still have difficulty reaching the provider, please page the [facility] (Director on Call) for the Hospitalists listed on amion for assistance.  06/27/2023, 9:07 PM

## 2023-06-27 NOTE — ED Triage Notes (Signed)
 Pt Morgan [name], ambulatory, NAD c/o rectal bleeding since Friday, 2 episodes reporting first contained bright red blood in the BM, second had bright red blood with black stool. Pt also c/o abd discomfort and nausea. Pt does take Eliquis .

## 2023-06-27 NOTE — ED Notes (Signed)
 Report called to [facility] 4E and given to Vanessa

## 2023-06-28 DIAGNOSIS — K922 Gastrointestinal hemorrhage, unspecified: Secondary | ICD-10-CM

## 2023-06-28 LAB — PROTIME-INR
INR: 1.1 (ref 0.8–1.2)
Prothrombin Time: 14.2 s (ref 11.4–15.2)

## 2023-06-28 LAB — CBC
HCT: 36.4 % (ref 36.0–46.0)
Hemoglobin: 11.6 g/dL — ABNORMAL LOW (ref 12.0–15.0)
MCH: 27.8 pg (ref 26.0–34.0)
MCHC: 31.9 g/dL (ref 30.0–36.0)
MCV: 87.3 fL (ref 80.0–100.0)
Platelets: 163 K/uL (ref 150–400)
RBC: 4.17 MIL/uL (ref 3.87–5.11)
RDW: 14.6 % (ref 11.5–15.5)
WBC: 4.6 K/uL (ref 4.0–10.5)
nRBC: 0 % (ref 0.0–0.2)

## 2023-06-28 LAB — COMPREHENSIVE METABOLIC PANEL WITH GFR
ALT: 27 U/L (ref 0–44)
AST: 34 U/L (ref 15–41)
Albumin: 3.1 g/dL — ABNORMAL LOW (ref 3.5–5.0)
Alkaline Phosphatase: 81 U/L (ref 38–126)
Anion gap: 7 (ref 5–15)
BUN: 8 mg/dL (ref 6–20)
CO2: 25 mmol/L (ref 22–32)
Calcium: 8.8 mg/dL — ABNORMAL LOW (ref 8.9–10.3)
Chloride: 106 mmol/L (ref 98–111)
Creatinine, Ser: 0.61 mg/dL (ref 0.44–1.00)
GFR, Estimated: >60 mL/min (ref >60)
Glucose, Bld: 128 mg/dL — ABNORMAL HIGH (ref 70–99)
Potassium: 4.1 mmol/L (ref 3.5–5.1)
Sodium: 138 mmol/L (ref 135–145)
Total Bilirubin: 0.7 mg/dL (ref 0.0–1.2)
Total Protein: 5.8 g/dL — ABNORMAL LOW (ref 6.5–8.1)

## 2023-06-28 LAB — TYPE AND SCREEN
ABO/RH(D): A POS
Antibody Screen: NEGATIVE

## 2023-06-28 LAB — GLUCOSE, CAPILLARY
Glucose-Capillary: 139 mg/dL — ABNORMAL HIGH (ref 70–99)
Glucose-Capillary: 136 mg/dL — ABNORMAL HIGH (ref 70–99)

## 2023-06-28 LAB — HIV ANTIBODY (ROUTINE TESTING W REFLEX): HIV Screen 4th Generation wRfx: NONREACTIVE

## 2023-06-28 MED ORDER — PANTOPRAZOLE SODIUM 40 MG PO TBEC: 40.0000 mg | DELAYED_RELEASE_TABLET | Freq: Two times a day (BID) | ORAL | Status: DC

## 2023-06-28 MED ORDER — PANTOPRAZOLE SODIUM 40 MG PO TBEC: 40.0000 mg | DELAYED_RELEASE_TABLET | Freq: Two times a day (BID) | ORAL | 0 refills | Status: DC

## 2023-06-28 MED ORDER — HYDROCORTISONE ACETATE 25 MG RE SUPP: 25.0000 mg | Freq: Two times a day (BID) | RECTAL | 0 refills | Status: DC

## 2023-06-28 MED ORDER — HYDROCORTISONE ACETATE 25 MG RE SUPP
25.0000 mg | Freq: Two times a day (BID) | RECTAL | Status: DC
  Administered 2023-06-28: 25 mg via RECTAL
  Filled 2023-06-28 (×2): qty 1

## 2023-06-28 MED ORDER — ORAL CARE MOUTH RINSE: 15.0000 mL | OROMUCOSAL | Status: DC | PRN

## 2023-06-28 NOTE — Discharge Summary (Signed)
 Physician Discharge Summary   Patient: Morgan [name] MRN: [id number] DOB: February 21, 1965  Admit date:     06/27/2023  Discharge date: 06/28/23  Discharge Physician: Morgan [name]   PCP: Morgan Bearded, PA   Recommendations at discharge:   Continue to monitor for tarry/bloody stools Follow up with Morgan [name]; call for appointment Follow up with PA Morgan [name] in 1 week; you will need a repeat CBC Hold Eliquis  for 4 more days and then resume Stop Prilosec  (omeprazole ) and take Protonix  (pantoprazole ) twice daily Use Anusol suppositories twice daily  Discharge Diagnoses: Principal Problem:   [name] bleed Active Problems:   Bipolar disorder (HCC)   Type 2 diabetes mellitus with other specified complication (HCC)   Chronic pain syndrome   Paroxysmal atrial fibrillation (HCC)   Essential hypertension   Chronic abdominal pain   History of gastritis   Hospital Course: 57yo with h/o afib on Eliquis , HTN, and DM who presented with 3 episodes of bloody stools since Friday.  No further bleeding since admission.  [name] consulted.  Hgb stable.  Appears reasonable to discharge today pending [name] input.  Assessment and Plan:  [name] bleed Patient with h/o gastritis and duodenal polyp as well as chronic abdominal pain Complaining of [name] bleed that started Friday with several dark stools since, last on Sunday Hemodynamically stable Treated with IV Protonix  BID [name] consulted Normal colonoscopy a year ago Start Anusol suppositories Had another black stool today Hgb generally stable Offered overnight monitoring vs. Discharge, prefers discharge to home Outpatient f/u with [facility] [name]  Hold Eliquis  for 5 total days and then resume   Chronic abdominal pain and nausea Continue Reglan  as needed   Chronic pain syndrome PDMP reviewed, not on chronic pain medication   Paroxysmal atrial fibrillation on Eliquis  Rate controlled with flecainide , Bystolic  Resume Eliquis  after 5 days    Hyperlipidemia Continue rosuvastatin   Bipolar disorder Continue Cymbalta , loxepine   Insulin -dependent DM type II Last A1c was 8.2, suboptimal control Resume Xigduo  and SSI Carb modified diet   Class 1 Obesity Body mass index is 34.96 kg/m.Morgan [name]  Weight loss should be encouraged Outpatient PCP/bariatric medicine f/u encouraged Significantly low or high BMI is associated with higher medical risk including morbidity and mortality      Consultants: [name]  Procedures: None  Antibiotics: None     Pain control - [place]  Controlled Substance Reporting System database was reviewed. and patient was instructed, not to drive, operate heavy machinery, perform activities at heights, swimming or participation in water activities or provide baby-sitting services while on Pain, Sleep and Anxiety Medications; until their outpatient Physician has advised to do so again. Also recommended to not to take more than prescribed Pain, Sleep and Anxiety Medications.    Disposition: Home Diet recommendation:  Carb modified diet DISCHARGE MEDICATION: Allergies as of 06/28/2023       Reactions   Clindamycin/lincomycin Rash   Diffuse drug reaction eruption   Lamictal [lamotrigine] Rash   Banana Diarrhea   Lipitor [atorvastatin ] Other (See Comments)   Severe fatigue   Pineapple Diarrhea        Medication List     PAUSE taking these medications    Eliquis  5 MG Tabs tablet Wait to take this until: Jul 02, 2023 Generic drug: apixaban  TAKE 1 TABLET BY MOUTH TWICE A DAY       STOP taking these medications    omeprazole  20 MG tablet Commonly known as: PRILOSEC  OTC   oxycodone  5 MG capsule Commonly  known as: OXY-IR       TAKE these medications    acetaminophen  500 MG tablet Commonly known as: TYLENOL  Take 1,000 mg by mouth every 6 (six) hours as needed for mild pain (pain score 1-3).   bismuth subsalicylate 262 MG chewable tablet Commonly known as: PEPTO BISMOL Chew 524  mg by mouth as needed for diarrhea or loose stools or indigestion.   DULoxetine  60 MG capsule Commonly known as: CYMBALTA  Take 60 mg by mouth at bedtime.   flecainide  50 MG tablet Commonly known as: TAMBOCOR  Take 1 tablet (50 mg total) by mouth 2 (two) times daily.   Full Kit Nebulizer Set Misc Use as needed for wheezing   hydrocortisone 25 MG suppository Commonly known as: ANUSOL-HC Place 1 suppository (25 mg total) rectally 2 (two) times daily.   Insulin  Aspart FlexPen 100 UNIT/ML Commonly known as: NOVOLOG  INJECT 10 UNITS INTO THE SKIN BEFORE BREAKFAST AND LUNCH. INJECT 13 UNITS INTO THE SKIN BEFORE SUPPER. What changed:  how much to take how to take this when to take this additional instructions   loxapine 10 MG capsule Commonly known as: LOXITANE Take 10 mg by mouth 2 (two) times daily.   metoCLOPramide  10 MG tablet Commonly known as: Reglan  Take 1 tablet (10 mg total) by mouth every 6 (six) hours as needed for nausea (nausea/headache).   nebivolol  2.5 MG tablet Commonly known as: BYSTOLIC  Take 1 tablet (2.5 mg total) by mouth at bedtime.   pantoprazole  40 MG tablet Commonly known as: PROTONIX  Take 1 tablet (40 mg total) by mouth 2 (two) times daily.   rosuvastatin  10 MG tablet Commonly known as: CRESTOR  Take 1 tablet (10 mg total) by mouth daily.   tiZANidine  4 MG tablet Commonly known as: ZANAFLEX  Take 4 mg by mouth at bedtime as needed for muscle spasms.   Xigduo  XR 11-998 MG Tb24 Generic drug: Dapagliflozin  Pro-metFORMIN  ER TAKE 1 TABLET BY MOUTH EVERYDAY AT BEDTIME        Discharge Exam:   Subjective: No BM since Sunday (had a dark stool after I saw her today).  Feels ok, would like to go home.  Eating fine.   Objective: Vitals:   06/28/23 0528 06/28/23 1225  BP: 127/64 131/83  Pulse: 65 74  Resp: 18 18  Temp: 97.9 F (36.6 C) 98 F (36.7 C)  SpO2: 94% 93%    Intake/Output Summary (Last 24 hours) at 06/28/2023 1451 Last data filed  at 06/28/2023 1224 Gross per 24 hour  Intake 648.88 ml  Output --  Net 648.88 ml   Filed Weights   06/27/23 1125  Weight: 83.9 kg    Exam:  General:  Appears calm and comfortable and is in NAD Eyes:  EOMI, normal lids, iris ENT:  grossly normal hearing, lips & tongue, mmm Cardiovascular:  RRR. No LE edema.  Respiratory:   CTA bilaterally with no wheezes/rales/rhonchi.  Normal respiratory effort. Abdomen:  soft, NT, ND Skin:  no rash or induration seen on limited exam Musculoskeletal:  grossly normal tone BUE/BLE, good ROM, no bony abnormality Psychiatric:  grossly normal mood and affect, speech fluent and appropriate, AOx3 Neurologic:  CN 2-12 grossly intact, moves all extremities in coordinated fashion, sensation intact  Data Reviewed: I have reviewed the patient's lab results since admission.  Pertinent labs for today include:  Glucose 128 Albumin  3.1 Hgb 11.6, down from 13     Condition at discharge: stable  The results of significant diagnostics from this hospitalization (including imaging,  microbiology, ancillary and laboratory) are listed below for reference.   Imaging Studies: CT Angio Abd/Pel W and/or Wo Contrast Result Date: 06/27/2023 CLINICAL DATA:  Lower [name] bleed. EXAM: CTA ABDOMEN AND PELVIS WITHOUT AND WITH CONTRAST TECHNIQUE: Multidetector CT imaging of the abdomen and pelvis was performed using the standard protocol during bolus administration of intravenous contrast. Multiplanar reconstructed images and MIPs were obtained and reviewed to evaluate the vascular anatomy. RADIATION DOSE REDUCTION: This exam was performed according to the departmental dose-optimization program which includes automated exposure control, adjustment of the mA and/or kV according to patient size and/or use of iterative reconstruction technique. CONTRAST:  OMNIPAQUE  IOHEXOL  350 MG/ML SOLN COMPARISON:  Chest CTA 04/17/2023 FINDINGS: VASCULAR Aorta: Normal caliber aorta without  aneurysm, dissection, vasculitis or significant stenosis. Minor atherosclerosis. Celiac: Patent without evidence of aneurysm, dissection, vasculitis or significant stenosis. SMA: Patent without evidence of aneurysm, dissection, vasculitis or significant stenosis. Renals: 2 right renal arteries. Single left renal artery arises from the distal aorta, the left kidney is ectopically located in the pelvis. All renal arteries are patent without evidence of aneurysm, dissection, vasculitis, fibromuscular dysplasia or significant stenosis. IMA: Patent without evidence of aneurysm, dissection, vasculitis or significant stenosis. Inflow: Patent without evidence of aneurysm, dissection, vasculitis or significant stenosis. Proximal Outflow: Bilateral common femoral and visualized portions of the superficial and profunda femoral arteries are patent without evidence of aneurysm, dissection, vasculitis or significant stenosis. Veins: Venous phase imaging demonstrates patency of the venous structures. Particularly, the portal, splenic, and mesenteric veins are patent. Review of the MIP images confirms the above findings. NON-VASCULAR Lower chest: Mild heterogeneous pulmonary parenchyma. No basilar airspace disease or pleural effusion. Hepatobiliary: Diffuse hepatic steatosis. Stable enhancing lesion in the periphery of the right lobe of the liver measuring 18 mm characterized as hemangioma. No new hepatic abnormality. Gallbladder physiologically distended, no calcified stone. No biliary dilatation. Pancreas: Fatty atrophy.  No ductal dilatation or inflammation. Spleen: Normal in size without focal abnormality. Adrenals/Urinary Tract: Normal adrenal glands. The left kidney is ectopically located in the pelvis. No hydronephrosis or renal inflammation. No renal calculi. Partially distended urinary bladder, normal for degree of distension. Stomach/Bowel: There is no contrast accumulating in the [name] tract to localize site of [name] bleed.  Moderate diffuse colonic stool burden with sigmoid colonic redundancy. No bowel wall thickening or inflammatory change. The appendix is not well seen on the current exam Lymphatic: No enlarged or suspicious lymph nodes in the abdomen or pelvis. Reproductive: Status post hysterectomy. No adnexal masses. Other: No free air, free fluid, or intra-abdominal fluid collection. Diminutive fat containing umbilical hernia. Musculoskeletal: Posterior L4-L5 fusion hardware. Stable sclerotic focus within L2. There are no acute or suspicious osseous abnormalities. IMPRESSION: 1. No contrast accumulating in the [name] tract to localize site of [name] bleed. 2. No acute abnormality in the abdomen/pelvis. 3. Hepatic steatosis. Stable hemangioma in the right lobe of the liver. 4. 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 Aortic Atherosclerosis (ICD10-I70.0). Electronically Signed   By: Chadwick Colonel M.D.   On: 06/27/2023 16:47   CT Head Wo Contrast Result Date: 06/04/2023 CLINICAL DATA:  Sudden severe headache EXAM: CT HEAD WITHOUT CONTRAST TECHNIQUE: Contiguous axial images were obtained from the base of the skull through the vertex without intravenous contrast. RADIATION DOSE REDUCTION: This exam was performed according to the departmental dose-optimization program which includes automated exposure control, adjustment of the mA and/or kV according to patient size and/or use of iterative reconstruction technique. COMPARISON:  CT 10/16/2022  FINDINGS: Brain: No intracranial hemorrhage, mass effect, or evidence of acute infarct. No hydrocephalus. No extra-axial fluid collection. Vascular: No hyperdense vessel or unexpected calcification. Skull: No fracture or focal lesion. Sinuses/Orbits: No acute finding. Other: None. IMPRESSION: No acute intracranial abnormality. Electronically Signed   By: Rozell Cornet M.D.   On: 06/04/2023 20:31   DG Chest Port 1 View Result Date: 06/04/2023 CLINICAL DATA:  chest pain EXAM: PORTABLE CHEST  - 1 VIEW COMPARISON:  04/16/2023 FINDINGS: Lungs are clear.  No pneumothorax. Heart size and mediastinal contours are within normal limits. No effusion. Cervical fixation hardware partially visualized. IMPRESSION: No acute cardiopulmonary disease. Electronically Signed   By: Nicoletta Barrier M.D.   On: 06/04/2023 19:59    Microbiology: Results for orders placed or performed during the hospital encounter of 09/07/22  SARS Coronavirus 2 by RT PCR (hospital order, performed in [facility] hospital lab) *cepheid single result test* Anterior Nasal Swab     Status: None   Collection Time: 09/07/22  2:29 PM   Specimen: Anterior Nasal Swab  Result Value Ref Range Status   SARS Coronavirus 2 by RT PCR NEGATIVE NEGATIVE Final    Comment: (NOTE) SARS-CoV-2 target nucleic acids are NOT DETECTED.  The SARS-CoV-2 RNA is generally detectable in upper and lower respiratory specimens during the acute phase of infection. The lowest concentration of SARS-CoV-2 viral copies this assay can detect is 250 copies / mL. A negative result does not preclude SARS-CoV-2 infection and should not be used as the sole basis for treatment or other patient management decisions.  A negative result may occur with improper specimen collection / handling, submission of specimen other than nasopharyngeal swab, presence of viral mutation(s) within the areas targeted by this assay, and inadequate number of viral copies (<250 copies / mL). A negative result must be combined with clinical observations, patient history, and epidemiological information.  Fact Sheet for Patients:   RoadLapTop.co.za  Fact Sheet for Healthcare Providers: http://kim-miller.com/  This test is not yet approved or  cleared by the [place]  FDA and has been authorized for detection and/or diagnosis of SARS-CoV-2 by FDA under an Emergency Use Authorization (EUA).  This EUA will remain in effect (meaning this  test can be used) for the duration of the COVID-19 declaration under Section 564(b)(1) of the Act, 21 U.S.C. section 360bbb-3(b)(1), unless the authorization is terminated or revoked sooner.  Performed at [facility], [address], [place], [place] [address]     Labs: CBC: Recent Labs  Lab 06/27/23 1127 06/28/23 0514  WBC 6.1 4.6  HGB 13.0 11.6*  HCT 40.7 36.4  MCV 85.1 87.3  PLT 194 163   Basic Metabolic Panel: Recent Labs  Lab 06/27/23 1127 06/28/23 0514  NA 139 138  K 4.0 4.1  CL 101 106  CO2 25 25  GLUCOSE 248* 128*  BUN 9 8  CREATININE 0.67 0.61  CALCIUM  9.7 8.8*   Liver Function Tests: Recent Labs  Lab 06/27/23 1127 06/28/23 0514  AST 55* 34  ALT 32 27  ALKPHOS 114 81  BILITOT 0.4 0.7  PROT 6.4* 5.8*  ALBUMIN  4.2 3.1*   CBG: Recent Labs  Lab 06/27/23 2216 06/28/23 0751 06/28/23 1152  GLUCAP 119* 139* 136*    Discharge time spent: greater than 30 minutes.  Signed: Lorita Rosa, MD [facility] 06/28/2023

## 2023-06-28 NOTE — Progress Notes (Signed)
   06/28/23 0928  TOC Brief Assessment  Insurance and Status Reviewed  Patient has primary care physician Yes  Home environment has been reviewed Resides in single family home with spouse and child(ren)  Prior level of function: Independent with ADLs at baseline  Prior/Current Home Services No current home services  Social Drivers of Health Review SDOH reviewed no interventions necessary  Readmission risk has been reviewed Yes  Transition of care needs no transition of care needs at this time

## 2023-06-28 NOTE — Plan of Care (Signed)
 Patient alert, no episode of bloody stools thru the night. Given PRN tylenol  for abdominal pain and VSS.  Bed locked and in low position.  Problem: Pain Managment: Goal: General experience of comfort will improve and/or be controlled Outcome: Progressing   Problem: Elimination: Goal: Will not experience complications related to bowel motility Outcome: Progressing  Call light given and within reach. Comfort measures provided.  Problem: Health Behavior/Discharge Planning: Goal: Ability to manage health-related needs will improve Outcome: Progressing

## 2023-06-28 NOTE — Hospital Course (Signed)
 57yo with h/o afib on Eliquis , HTN, and DM who presented with 3 episodes of bloody stools since Friday.  No further bleeding since admission.  GI consulted.  Hgb stable.  Appears reasonable to discharge today pending GI input.

## 2023-06-28 NOTE — Consult Note (Signed)
 Referring Provider: [facility] Primary Care Physician:  Noreene Bearded, PA Primary Gastroenterologist: Unassigned/previously established at [facility] as well as with [facility].  Reason for Consultation: Rectal bleeding  HPI: Morgan [name] is a 58 y.o. female with past medical history of atrial fibrillation on Eliquis , hypertension and diabetes presented emergency department with rectal bleeding which was started on Friday.  Blood work yesterday showed normal CBC.  Negative FOBT.  Mild elevated AST at 55 elevated glucose otherwise normal CMP.  CT angio negative for any acute bleeding.  No acute changes.  CBC this morning showed mild drop in hemoglobin to 11.6.  GI is consulted for further evaluation.  Last colonoscopy last year at [facility] per previous GI notes.  Also had colonoscopy March 2020 which showed few colon polyps.  Patient seen and examined at bedside.  According to patient, she had 1 episode of small amount of fresh blood in the stool on Friday.  She was having some left lower quadrant abdominal discomfort so she took some Pepto-Bismol and she had black-colored stool after that.  She is also seeing some streaks of fresh blood along with black-colored stool.  She denies any reflux or trouble swallowing.  Denies any epigastric pain, nausea or vomiting.  Denies NSAID use.  Last EGD was around 5 years ago.   Past Medical History:  Diagnosis Date   Anxiety    Atrial fibrillation (HCC)    Bipolar disorder (HCC)    Breast mass    right, removed and benign   Cancer (HCC) 09/16/2019   ovarian ca no radiation no chemo   Cervical dysplasia    Diabetes mellitus without complication (HCC)    Endometriosis    GERD (gastroesophageal reflux disease)    Hypercholesteremia    Hypertension    Migraine    Ovarian cancer (HCC)    Pelvic kidney    left   Postpartum depression    hx of    Past Surgical History:  Procedure Laterality Date   ATRIAL FIBRILLATION ABLATION  N/A 05/16/2023   Procedure: ATRIAL FIBRILLATION ABLATION;  Surgeon: Efraim Grange, MD;  Location: [facility];  Service: Cardiovascular;  Laterality: N/A;   BACK SURGERY  02/16/2012   BREAST BIOPSY Right 2016   BREAST BIOPSY Right 2020   BREAST EXCISIONAL BIOPSY Right 2021   BREAST EXCISIONAL BIOPSY Right 2016   benign   BREAST LUMPECTOMY WITH RADIOACTIVE SEED LOCALIZATION Right 10/28/2014   Procedure: RIGHT BREAST LUMPECTOMY WITH RADIOACTIVE SEED LOCALIZATION;  Surgeon: Lillette Reid III, MD;  Location: [facility];  Service: General;  Laterality: Right;   BREAST LUMPECTOMY WITH RADIOACTIVE SEED LOCALIZATION Right 06/18/2019   Procedure: RIGHT BREAST RADIOACTIVE SEED LOCALIZATION LUMPECTOMY;  Surgeon: Caralyn Chandler, MD;  Location:  SURGERY CENTER;  Service: General;  Laterality: Right;   BREAST SURGERY N/A    Phreesia 07/30/2019   CESAREAN SECTION  02/15/2001   COLONOSCOPY WITH PROPOFOL  N/A 05/01/2018   Procedure: COLONOSCOPY WITH PROPOFOL ;  Surgeon: Luke Salaam, MD;  Location: [facility];  Service: Gastroenterology;  Laterality: N/A;   COLPOSCOPY     ESOPHAGOGASTRODUODENOSCOPY (EGD) WITH PROPOFOL  N/A 05/01/2018   Procedure: ESOPHAGOGASTRODUODENOSCOPY (EGD) WITH PROPOFOL ;  Surgeon: Luke Salaam, MD;  Location: [facility] ENDOSCOPY;  Service: Gastroenterology;  Laterality: N/A;   ESOPHAGOGASTRODUODENOSCOPY (EGD) WITH PROPOFOL  N/A 11/13/2018   Procedure: ESOPHAGOGASTRODUODENOSCOPY (EGD) WITH PROPOFOL ;  Surgeon: Luke Salaam, MD;  Location: [facility]COPY;  Service: Gastroenterology;  Laterality: N/A;   GYNECOLOGIC CRYOSURGERY  NECK SURGERY  02/15/2010   PELVIC LAPAROSCOPY  02/15/1989   TOTAL ABDOMINAL HYSTERECTOMY N/A    Phreesia 07/30/2019    Prior to Admission medications   Medication Sig Start Date End Date Taking? Authorizing Provider  acetaminophen  (TYLENOL ) 500 MG tablet Take 1,000 mg by mouth every 6 (six) hours as needed for mild pain (pain score  1-3).    [provider]  apixaban  (ELIQUIS ) 5 MG TABS tablet TAKE 1 TABLET BY MOUTH TWICE A DAY 11/01/22   Varanasi, Jayadeep S, MD  bismuth subsalicylate (PEPTO BISMOL) 262 MG chewable tablet Chew 524 mg by mouth as needed for diarrhea or loose stools or indigestion.    [provider]  DULoxetine  (CYMBALTA ) 60 MG capsule Take 60 mg by mouth at bedtime. 11/04/16   [provider]  flecainide  (TAMBOCOR ) 50 MG tablet Take 1 tablet (50 mg total) by mouth 2 (two) times daily. 06/13/23   Fenton, Clint R, PA  Insulin  Aspart FlexPen (NOVOLOG ) 100 UNIT/ML INJECT 10 UNITS INTO THE SKIN BEFORE BREAKFAST AND LUNCH. INJECT 13 UNITS INTO THE SKIN BEFORE SUPPER. Patient taking differently: Inject 12 Units as directed 3 (three) times daily with meals. 05/03/23   Laneta Pintos, MD  loxapine (LOXITANE) 10 MG capsule Take 10 mg by mouth 2 (two) times daily. 06/08/23   [provider]  metoCLOPramide  (REGLAN ) 10 MG tablet Take 1 tablet (10 mg total) by mouth every 6 (six) hours as needed for nausea (nausea/headache). 06/04/23   Dalene Duck, MD  nebivolol  (BYSTOLIC ) 2.5 MG tablet Take 1 tablet (2.5 mg total) by mouth at bedtime. 10/26/22   Swinyer, Leilani Punter, NP  omeprazole  (PRILOSEC  OTC) 20 MG tablet Take 20 mg by mouth at bedtime.    [provider]  oxycodone  (OXY-IR) 5 MG capsule Take 5 mg by mouth every 4 (four) hours as needed for pain.    [provider]  Respiratory Therapy Supplies (FULL KIT NEBULIZER SET) MISC Use as needed for wheezing 04/08/23   Laneta Pintos, MD  rosuvastatin  (CRESTOR ) 10 MG tablet Take 1 tablet (10 mg total) by mouth daily. 04/08/22   Lucendia Rusk, MD  tiZANidine  (ZANAFLEX ) 4 MG tablet Take 4 mg by mouth at bedtime as needed for muscle spasms. 05/14/21   [provider]  XIGDUO  XR 11-998 MG TB24 TAKE 1 TABLET BY MOUTH EVERYDAY AT BEDTIME 04/07/23   Maryclare Smoke A, PA    Scheduled Meds:  insulin  aspart  0-5  Units Subcutaneous QHS   insulin  aspart  0-6 Units Subcutaneous TID WC   pantoprazole  (PROTONIX ) IV  40 mg Intravenous Q12H   sodium chloride  flush  3 mL Intravenous Q12H   sodium chloride  flush  3 mL Intravenous Q12H   Continuous Infusions:  sodium chloride      lactated ringers  125 mL/hr at 06/28/23 0615   PRN Meds:.sodium chloride , acetaminophen  **OR** acetaminophen , bismuth subsalicylate, metoCLOPramide  (REGLAN ) injection, ondansetron  **OR** ondansetron  (ZOFRAN ) IV, mouth rinse, oxyCODONE , sodium chloride  flush  Allergies as of 06/27/2023 - Review Complete 06/27/2023  Allergen Reaction Noted   Clindamycin/lincomycin Rash 01/24/2019   Lamictal [lamotrigine] Rash 08/29/2014   Banana Diarrhea 04/28/2022   Lipitor [atorvastatin ] Other (See Comments) 03/03/2022   Pineapple Diarrhea 04/28/2022    Family History  Problem Relation Age of Onset   Diabetes Mother    Breast cancer Mother 67   Thyroid  disease Mother    Depression Mother    Hypertension Father    Heart disease Father  Heart attack Father 4       Two instances, first at age 30   Stroke Father    Alcohol abuse Father    Throat cancer Father    Diabetes Maternal Grandmother    Depression Maternal Grandmother    Uterine cancer Maternal Grandmother     Social History   Socioeconomic History   Marital status: Married    Spouse name: Not on file   Number of children: Not on file   Years of education: Not on file   Highest education level: Not on file  Occupational History   Not on file  Tobacco Use   Smoking status: Never    Passive exposure: Never   Smokeless tobacco: Never   Tobacco comments:    Never smoke 07/01/21  Vaping Use   Vaping status: Never Used  Substance and Sexual Activity   Alcohol use: No   Drug use: No   Sexual activity: Not on file    Comment: vasectomy  Other Topics Concern   Not on file  Social History Narrative   Lives in [place] with spouse and son.   Disabled.   Social  Drivers of Corporate investment banker Strain: Not on file  Food Insecurity: No Food Insecurity (06/27/2023)   Hunger Vital Sign    Worried About Running Out of Food in the Last Year: Never true    Ran Out of Food in the Last Year: Never true  Transportation Needs: No Transportation Needs (06/27/2023)   PRAPARE - Administrator, Civil Service (Medical): No    Lack of Transportation (Non-Medical): No  Physical Activity: Not on file  Stress: Not on file  Social Connections: Moderately Integrated (06/27/2023)   Social Connection and Isolation Panel [NHANES]    Frequency of Communication with Friends and Family: More than three times a week    Frequency of Social Gatherings with Friends and Family: Twice a week    Attends Religious Services: More than 4 times per year    Active Member of [employer] or Organizations: No    Attends Banker Meetings: Never    Marital Status: Married  Catering manager Violence: Not At Risk (06/27/2023)   Humiliation, Afraid, Rape, and Kick questionnaire    Fear of Current or Ex-Partner: No    Emotionally Abused: No    Physically Abused: No    Sexually Abused: No    Review of Systems: All negative except as stated above in HPI.  Physical Exam: Vital signs: Vitals:   06/28/23 0059 06/28/23 0528  BP: 128/74 127/64  Pulse: 64 65  Resp: 19 18  Temp: 98 F (36.7 C) 97.9 F (36.6 C)  SpO2: 94% 94%   Last BM Date : 06/26/23 (per patient) Physical Exam Constitutional:      General: She is not in acute distress.    Appearance: Normal appearance.  HENT:     Head: Normocephalic and atraumatic.     Nose: Nose normal.  Eyes:     Extraocular Movements: Extraocular movements intact.  Cardiovascular:     Rate and Rhythm: Normal rate and regular rhythm.     Heart sounds: No murmur heard. Pulmonary:     Effort: Pulmonary effort is normal. No respiratory distress.  Abdominal:     General: Bowel sounds are normal. There is no distension.      Palpations: Abdomen is soft.     Tenderness: There is no abdominal tenderness.     Comments:  Mild left lower quadrant discomfort on palpation  Skin:    General: Skin is warm.     Coloration: Skin is not jaundiced.  Neurological:     General: No focal deficit present.     Mental Status: She is alert and oriented to person, place, and time.  Psychiatric:        Mood and Affect: Mood normal.        Behavior: Behavior normal.        Thought Content: Thought content normal.        Judgment: Judgment normal.      GI:  Lab Results: Recent Labs    06/27/23 1127 06/28/23 0514  WBC 6.1 4.6  HGB 13.0 11.6*  HCT 40.7 36.4  PLT 194 163   BMET Recent Labs    06/27/23 1127 06/28/23 0514  NA 139 138  K 4.0 4.1  CL 101 106  CO2 25 25  GLUCOSE 248* 128*  BUN 9 8  CREATININE 0.67 0.61  CALCIUM  9.7 8.8*   LFT Recent Labs    06/28/23 0514  PROT 5.8*  ALBUMIN  3.1*  AST 34  ALT 27  ALKPHOS 81  BILITOT 0.7   PT/INR Recent Labs    06/28/23 0643  LABPROT 14.2  INR 1.1     Studies/Results: CT Angio Abd/Pel W and/or Wo Contrast Result Date: 06/27/2023 CLINICAL DATA:  Lower GI bleed. EXAM: CTA ABDOMEN AND PELVIS WITHOUT AND WITH CONTRAST TECHNIQUE: Multidetector CT imaging of the abdomen and pelvis was performed using the standard protocol during bolus administration of intravenous contrast. Multiplanar reconstructed images and MIPs were obtained and reviewed to evaluate the vascular anatomy. RADIATION DOSE REDUCTION: This exam was performed according to the departmental dose-optimization program which includes automated exposure control, adjustment of the mA and/or kV according to patient size and/or use of iterative reconstruction technique. CONTRAST:  OMNIPAQUE  IOHEXOL  350 MG/ML SOLN COMPARISON:  Chest CTA 04/17/2023 FINDINGS: VASCULAR Aorta: Normal caliber aorta without aneurysm, dissection, vasculitis or significant stenosis. Minor atherosclerosis. Celiac: Patent  without evidence of aneurysm, dissection, vasculitis or significant stenosis. SMA: Patent without evidence of aneurysm, dissection, vasculitis or significant stenosis. Renals: 2 right renal arteries. Single left renal artery arises from the distal aorta, the left kidney is ectopically located in the pelvis. All renal arteries are patent without evidence of aneurysm, dissection, vasculitis, fibromuscular dysplasia or significant stenosis. IMA: Patent without evidence of aneurysm, dissection, vasculitis or significant stenosis. Inflow: Patent without evidence of aneurysm, dissection, vasculitis or significant stenosis. Proximal Outflow: Bilateral common femoral and visualized portions of the superficial and profunda femoral arteries are patent without evidence of aneurysm, dissection, vasculitis or significant stenosis. Veins: Venous phase imaging demonstrates patency of the venous structures. Particularly, the portal, splenic, and mesenteric veins are patent. Review of the MIP images confirms the above findings. NON-VASCULAR Lower chest: Mild heterogeneous pulmonary parenchyma. No basilar airspace disease or pleural effusion. Hepatobiliary: Diffuse hepatic steatosis. Stable enhancing lesion in the periphery of the right lobe of the liver measuring 18 mm characterized as hemangioma. No new hepatic abnormality. Gallbladder physiologically distended, no calcified stone. No biliary dilatation. Pancreas: Fatty atrophy.  No ductal dilatation or inflammation. Spleen: Normal in size without focal abnormality. Adrenals/Urinary Tract: Normal adrenal glands. The left kidney is ectopically located in the pelvis. No hydronephrosis or renal inflammation. No renal calculi. Partially distended urinary bladder, normal for degree of distension. Stomach/Bowel: There is no contrast accumulating in the GI tract to localize site of GI bleed. Moderate diffuse  colonic stool burden with sigmoid colonic redundancy. No bowel wall thickening or  inflammatory change. The appendix is not well seen on the current exam Lymphatic: No enlarged or suspicious lymph nodes in the abdomen or pelvis. Reproductive: Status post hysterectomy. No adnexal masses. Other: No free air, free fluid, or intra-abdominal fluid collection. Diminutive fat containing umbilical hernia. Musculoskeletal: Posterior L4-L5 fusion hardware. Stable sclerotic focus within L2. There are no acute or suspicious osseous abnormalities. IMPRESSION: 1. No contrast accumulating in the GI tract to localize site of GI bleed. 2. No acute abnormality in the abdomen/pelvis. 3. Hepatic steatosis. Stable hemangioma in the right lobe of the liver. 4. 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 Aortic Atherosclerosis (ICD10-I70.0). Electronically Signed   By: Chadwick Colonel M.D.   On: 06/27/2023 16:47    Impression/Plan: -Small amount of rectal bleeding.  2 episodes since Friday.  Hemoglobin 11.6.  CT angio negative for any bleeding or any acute changes.  Occult blood negative on admission. - Dark/black-colored stool after taking Pepto-Bismol.  Likely from Pepto-Bismol use.  Patient denies any epigastric pain, reflux or trouble swallowing.  Denies any nausea vomiting.  Occult blood negative. - Left lower quadrant abdominal discomfort.  Intermittent for several weeks.  CT scan negative for any acute changes.  No leukocytosis.  Normal LFTs.   Recommendations --------------------------- - Patient had colonoscopy last year at [facility] which was normal according to patient.  Report not available to review.  Patient also had colonoscopy few years ago at outside facility. - Recommend supportive care for now. -Start Anusol suppository twice a day for 2 weeks. - Recommend Protonix  40 mg once a day for reported dark stool which could be from Pepto-Bismol use. - Start soft diet and advance as tolerated. - Recommend follow-up with primary GI at [facility] after discharge.  No further inpatient GI  workup planned.  GI will sign off.  Call us  back if needed.    LOS: 0 days   Felecia Hopper  MD, FACP 06/28/2023, 9:18 AM  Contact #  [phone]

## 2023-06-28 NOTE — Progress Notes (Signed)
 AVS reviewed w/patient who verbalized an understanding. PIV removed as noted. PT dressing for d/c to home. No  other questions at this time. Pt to lobby via w/c

## 2023-06-29 DIAGNOSIS — K625 Hemorrhage of anus and rectum: Secondary | ICD-10-CM

## 2023-06-29 NOTE — Telephone Encounter (Signed)
 This message was sent to patient via MyChart on 06/29/2023.

## 2023-07-04 ENCOUNTER — Ambulatory Visit: Admitting: Family Medicine

## 2023-07-04 ENCOUNTER — Encounter: Payer: Self-pay | Admitting: Family Medicine

## 2023-07-04 VITALS — BP 113/76 | HR 65 | Ht 61.0 in | Wt 189.0 lb

## 2023-07-04 DIAGNOSIS — Z794 Long term (current) use of insulin: Secondary | ICD-10-CM

## 2023-07-04 DIAGNOSIS — E1169 Type 2 diabetes mellitus with other specified complication: Secondary | ICD-10-CM | POA: Diagnosis not present

## 2023-07-04 DIAGNOSIS — I480 Paroxysmal atrial fibrillation: Secondary | ICD-10-CM

## 2023-07-04 LAB — POCT GLYCOSYLATED HEMOGLOBIN (HGB A1C): Hemoglobin A1C: 8.3 % — AB (ref 4.0–5.6)

## 2023-07-04 MED ORDER — INSULIN GLARGINE 100 UNIT/ML SOLOSTAR PEN: 10.0000 [IU] | PEN_INJECTOR | Freq: Every day | SUBCUTANEOUS | 1 refills | Status: DC

## 2023-07-04 NOTE — Progress Notes (Signed)
   Established Patient Office Visit  Subjective   Patient ID: Morgan [name], female    DOB: Oct 07, 1965  Age: 58 y.o. MRN: [id number]  Chief Complaint  Patient presents with   Medication Management    HPI  Subjective - Rectal bleeding: Bright red blood in toilet bowl, black stool at times. Bleeding stopped this morning - Atrial fibrillation: Had ablation on 05/16/2023, experienced problems afterward but medication has "calmed it down" - Diabetes: A1C 8.3 (previously 8.2-8.4)  Medications: Eliquis  (held for 4 days due to bleeding), Zigduo, short-acting insulin  three times daily.  PMH: Atrial fibrillation, Type 2 diabetes, history of colon polyps (non-cancerous). PSH: Atrial fibrillation ablation 05/16/2023. FH: None mentioned. Social Hx: None mentioned.  ROS: Denies current rectal bleeding.   The 10-year ASCVD risk score (Arnett DK, et al., 2019) is: 3.9%  Health Maintenance Due  Topic Date Due   COVID-19 Vaccine (1) Never done   Pneumococcal Vaccine 36-9 Years old (2 of 2 - PCV) 12/06/2019   Zoster Vaccines- Shingrix  (2 of 2) 08/18/2022   OPHTHALMOLOGY EXAM  04/22/2023   Colonoscopy  05/01/2023   Diabetic kidney evaluation - Urine ACR  06/23/2023      Objective:     BP 113/76   Pulse 65   Ht 5\' 1"  (1.549 m)   Wt 189 lb 0.6 oz (85.7 kg)   LMP 05/12/2011   SpO2 95%   BMI 35.72 kg/m    Physical Exam Gen: alert, oriented Pulm: no resp distress Psych: pleasant affect.     No results found for any visits on 07/04/23.      Assessment & Plan:   Type 2 diabetes mellitus with other specified complication, with long-term current use of insulin  (HCC) Assessment & Plan: -  A1C 8.3, unchanged from previous -  Urine protein/microalbumin test due this year -  Diabetic foot exam due this year (deferred to next visit) -  Continue Zigduo -  Transition from short-acting insulin  TID to Lantus  10 units daily -  Titrate Lantus  by 2 units if fasting glucose >140,  target 100-140 -  Contact office if requiring >30 units daily  Orders: -     Microalbumin / creatinine urine ratio  Paroxysmal atrial fibrillation (HCC) Assessment & Plan: Has had an ablation within the past few months.  Then has had GI bleeding with bright red blood up until today.  No blood today so far.  Has not restarted her Eliquis  since discharge from the hospital due to continued bleeding.  Advised her to wait at least 4 days without bleeding before restarting.   Other orders -     Insulin  Glargine; Inject 10 Units into the skin daily. Titrate up to goal of fasting glucose level between 100-140.  Dispense: 15 mL; Refill: 1     Return in about 3 months (around 10/04/2023) for DM.    Laneta Pintos, MD

## 2023-07-04 NOTE — Addendum Note (Signed)
 Addended by: Catarina Cline on: 07/04/2023 05:09 PM   Modules accepted: Orders

## 2023-07-04 NOTE — Patient Instructions (Signed)
 It was nice to see you today,  We addressed the following topics today: -I am prescribing long-acting insulin , Lantus , for you to use daily.  Use this instead of your short acting insulin  - You will start with 10 units of Lantus  every morning. - Take your morning fasting blood sugar every morning.  If this number is greater than 140, increase your Lantus  by 2 units. - Continue to do this until your fasting blood sugars regularly between 100 and 140. - If it goes below 100 decrease your Lantus  by 2 units. - If it is below 70 do not give yourself insulin  that day and let us  know. - Continue your Xigduo   - I would like you to wait until you have not had any rectal bleeding for 4 days before you restart your Eliquis .  If you start to have bleeding again after restarting please let me or your cardiologist know.  Have a great day,  Etha Henle, MD

## 2023-07-04 NOTE — Assessment & Plan Note (Signed)
-    A1C 8.3, unchanged from previous -  Urine protein/microalbumin test due this year -  Diabetic foot exam due this year (deferred to next visit) -  Continue Zigduo -  Transition from short-acting insulin  TID to Lantus  10 units daily -  Titrate Lantus  by 2 units if fasting glucose >140, target 100-140 -  Contact office if requiring >30 units daily

## 2023-07-04 NOTE — Assessment & Plan Note (Signed)
 Has had an ablation within the past few months.  Then has had GI bleeding with bright red blood up until today.  No blood today so far.  Has not restarted her Eliquis  since discharge from the hospital due to continued bleeding.  Advised her to wait at least 4 days without bleeding before restarting.

## 2023-07-05 ENCOUNTER — Ambulatory Visit: Payer: Self-pay | Admitting: Family Medicine

## 2023-07-05 LAB — MICROALBUMIN / CREATININE URINE RATIO
Creatinine, Urine: 91.2 mg/dL
Microalb/Creat Ratio: 30 mg/g{creat} — ABNORMAL HIGH (ref 0–29)
Microalbumin, Urine: 27.2 ug/mL

## 2023-07-15 ENCOUNTER — Other Ambulatory Visit: Payer: Self-pay | Admitting: Interventional Cardiology

## 2023-07-15 NOTE — Telephone Encounter (Signed)
 Rx refill sent to pharmacy.

## 2023-08-15 NOTE — Progress Notes (Unsigned)
 Electrophysiology Office Note:   Date:  08/16/2023  ID:  Morgan [name], DOB 01/04/1966, MRN [id number]  Primary Cardiologist: None Primary Heart Failure: None Electrophysiologist: Eulas FORBES Furbish, MD      History of Present Illness:   Morgan [name] is a 58 y.o. female with h/o AF, AFL, HTN, HLD, GIB, ischemic colitis, DM II, bipolar disorder, chronic pain syndrome seen today for routine electrophysiology follow-up s/p Ablation.  The patient was admitted 5/12-5/13/25 in the setting of dark, tarry stools. She has a hx of gastritis and duodenal polyps.  Hgb drift from ~13 to 11 gm.  She was instructed to hold her Eliquis  for 5 days and then resume.  No further bleeding since that time.    She reports she has had ongoing episodes of AF / fluttering.  One recent episode lasted for 26h.  She is frequently woke up with episodes.  She monitors with her blood pressure cuff and her HR's were ranging from 40's to 120's.    She denies chest pain, palpitations, dyspnea, PND, orthopnea, nausea, vomiting, dizziness, syncope, edema, weight gain, or early satiety.    Review of systems complete and found to be negative unless listed in HPI.   EP Information / Studies Reviewed:    EKG is ordered today. Personal review as below.  EKG Interpretation Date/Time:  Tuesday August 16 2023 10:14:01 EDT Ventricular Rate:  77 PR Interval:  166 QRS Duration:  88 QT Interval:  392 QTC Calculation: 443 R Axis:   54  Text Interpretation: Normal sinus rhythm Normal ECG Confirmed by Aniceto Jarvis ([id number]) on 08/16/2023 10:36:22 AM   Studies:  ECHO 04/28/22 > LVEF 60-65%, normal atrium size Cardiac Monitor 03/09/23 > SR HR 45-122 bpm, ave 69 bpm, no AF detected, rare ectopy, no sustained arrhythmias, trigger episodes correspond to SR  CT Cardiac Morph / Pulm 04/25/23 > normal PV drainage into LA, no PFO/ASD, normal coronary origin / right dominance, CAC score 0 / 1st percentile for matched controls  EPS 05/16/23 >  SR on presentation, PV ablation with PF energy, ablation of posterior wall with PF  Arrhythmia / AAD AF    Risk Assessment/Calculations:    CHA2DS2-VASc Score = 3   This indicates a 3.2% annual risk of stroke. The patient's score is based upon: CHF History: 1 HTN History: 0 Diabetes History: 1 Stroke History: 0 Vascular Disease History: 0 Age Score: 0 Gender Score: 1             Physical Exam:   VS:  BP 126/78   Pulse 77   Ht 5' 1 (1.549 m)   Wt 193 lb 3.2 oz (87.6 kg)   LMP 05/12/2011   SpO2 96%   BMI 36.50 kg/m    Wt Readings from Last 3 Encounters:  08/16/23 193 lb 3.2 oz (87.6 kg)  07/04/23 189 lb 0.6 oz (85.7 kg)  06/27/23 185 lb (83.9 kg)     GEN: chronically ill appearing in NAD, well developed in no acute distress NECK: No JVD; No carotid bruits CARDIAC: Regular rate and rhythm, no murmurs, rubs, gallops RESPIRATORY:  Clear to auscultation without rales, wheezing or rhonchi  ABDOMEN: Soft, non-tender, non-distended EXTREMITIES:  No edema; No deformity   ASSESSMENT AND PLAN:    Paroxysmal Atrial Fibrillation  Palpitations  High Risk Drug Monitoring: Flecainide   CHA2DS2-VASc 3, s/p AF ablation 05/15/33 of PV's + PW -baseline Abilify  limits use of AAD -pt reports ongoing palpitations, AF burden since ablation  -monitors  with a blood pressure cuff -EKG with NSR, stable intervals   -continue flecainide  50 mg BID -continue Bystolic  2.5 mg at bedtime -assess cardiac monitor for 2 weeks, then follow up in clinic for review as it may be other rhythm / not AF   Secondary Hypercoagulable State  -continue Eliquis  5mg  BID, dose reviewed and appropriate by age / wt  Recent Rectal Bleeding -CTA abd neg for acute bleeding  -felt to be rectal bleeding +/- pepto bismol use (dark stool) -PPI recommended  -follows at [facility] for GI > last colonoscopy showed few colon polyps   Follow up with Dr. Nancey or EP APP 2 months   Signed, Daphne Barrack, NP-C,  AGACNP-BC [facility] - Electrophysiology  08/16/2023, 10:36 AM

## 2023-08-16 ENCOUNTER — Encounter: Payer: Self-pay | Admitting: Pulmonary Disease

## 2023-08-16 ENCOUNTER — Ambulatory Visit: Attending: Cardiology | Admitting: Pulmonary Disease

## 2023-08-16 ENCOUNTER — Ambulatory Visit: Attending: Pulmonary Disease

## 2023-08-16 VITALS — BP 126/78 | HR 77 | Ht 61.0 in | Wt 193.2 lb

## 2023-08-16 DIAGNOSIS — D6869 Other thrombophilia: Secondary | ICD-10-CM

## 2023-08-16 DIAGNOSIS — I480 Paroxysmal atrial fibrillation: Secondary | ICD-10-CM

## 2023-08-16 DIAGNOSIS — R002 Palpitations: Secondary | ICD-10-CM

## 2023-08-16 NOTE — Progress Notes (Unsigned)
 Enrolled patient for a 14 day Zio XT monitor to be mailed to patients home  Mealor to read

## 2023-08-16 NOTE — Patient Instructions (Signed)
 Medication Instructions:  Your physician recommends that you continue on your current medications as directed. Please refer to the Current Medication list given to you today.  *If you need a refill on your cardiac medications before your next appointment, please call your pharmacy*  Lab Work: None ordered If you have labs (blood work) drawn today and your tests are completely normal, you will receive your results only by: MyChart Message (if you have MyChart) OR A paper copy in the mail If you have any lab test that is abnormal or we need to change your treatment, we will call you to review the results.  Testing/Procedures: Morgan [name]- Long Term Monitor Instructions  Your physician has requested you wear a ZIO patch monitor for 14 days.  This is a single patch monitor. [employer] supplies one patch monitor per enrollment. Additional stickers are not available. Please do not apply patch if you will be having a Nuclear Stress Test,  Echocardiogram, Cardiac CT, MRI, or Chest Xray during the period you would be wearing the  monitor. The patch cannot be worn during these tests. You cannot remove and re-apply the  ZIO XT patch monitor.  Your ZIO patch monitor will be mailed 3 day USPS to your address on file. It may take 3-5 days  to receive your monitor after you have been enrolled.  Once you have received your monitor, please review the enclosed instructions. Your monitor  has already been registered assigning a specific monitor serial # to you.  Billing and Patient Assistance Program Information  We have supplied [employer] with any of your insurance information on file for billing purposes. [employer] offers a sliding scale Patient Assistance Program for patients that do not have  insurance, or whose insurance does not completely cover the cost of the ZIO monitor.  You must apply for the Patient Assistance Program to qualify for this discounted rate.  To apply, please call [employer] at [phone],  select option 4, select option 2, ask to apply for  Patient Assistance Program. Morgan [name] will ask your household income, and how many people  are in your household. They will quote your out-of-pocket cost based on that information.  [employer] will also be able to set up a 12-month, interest-free payment plan if needed.  Applying the monitor   Shave hair from upper left chest.  Hold abrader disc by orange tab. Rub abrader in 40 strokes over the upper left chest as  indicated in your monitor instructions.  Clean area with 4 enclosed alcohol pads. Let dry.  Apply patch as indicated in monitor instructions. Patch will be placed under collarbone on left  side of chest with arrow pointing upward.  Rub patch adhesive wings for 2 minutes. Remove white label marked 1. Remove the white  label marked 2. Rub patch adhesive wings for 2 additional minutes.  While looking in a mirror, press and release button in center of patch. A small green light will  flash 3-4 times. This will be your only indicator that the monitor has been turned on.  Do not shower for the first 24 hours. You may shower after the first 24 hours.  Press the button if you feel a symptom. You will hear a small click. Record Date, Time and  Symptom in the Patient Logbook.  When you are ready to remove the patch, follow instructions on the last 2 pages of Patient  Logbook. Stick patch monitor onto the last page of Patient Logbook.  Place Patient Logbook in the  blue and white box. Use locking tab on box and tape box closed  securely. The blue and white box has prepaid postage on it. Please place it in the mailbox as  soon as possible. Your physician should have your test results approximately 7 days after the  monitor has been mailed back to [facility].  Call [facility] Customer Care at [phone] if you have questions regarding  your ZIO XT patch monitor. Call them immediately if you see an orange light blinking on your   monitor.  If your monitor falls off in less than 4 days, contact our Monitor department at [phone].  If your monitor becomes loose or falls off after 4 days call [employer] at [phone] for  suggestions on securing your monitor   Follow-Up: At [facility], you and your health needs are our priority.  As part of our continuing mission to provide you with exceptional heart care, our providers are all part of one team.  This team includes your primary Cardiologist (physician) and Advanced Practice Providers or APPs (Physician Assistants and Nurse Practitioners) who all work together to provide you with the care you need, when you need it.  Your next appointment:   2 month(s)  Provider:   Eulas Furbish, MD or Daphne Barrack, NP

## 2023-08-20 ENCOUNTER — Emergency Department (HOSPITAL_COMMUNITY)

## 2023-08-20 ENCOUNTER — Observation Stay (HOSPITAL_COMMUNITY)
Admission: EM | Admit: 2023-08-20 | Discharge: 2023-08-20 | Disposition: A | Discharge status: Home / Self Care | Attending: Emergency Medicine | Admitting: Emergency Medicine

## 2023-08-20 ENCOUNTER — Observation Stay (HOSPITAL_COMMUNITY)

## 2023-08-20 ENCOUNTER — Other Ambulatory Visit: Payer: Self-pay

## 2023-08-20 ENCOUNTER — Encounter (HOSPITAL_COMMUNITY): Payer: Self-pay | Admitting: Emergency Medicine

## 2023-08-20 DIAGNOSIS — R0789 Other chest pain: Secondary | ICD-10-CM | POA: Diagnosis not present

## 2023-08-20 DIAGNOSIS — E785 Hyperlipidemia, unspecified: Secondary | ICD-10-CM | POA: Diagnosis present

## 2023-08-20 DIAGNOSIS — Z7901 Long term (current) use of anticoagulants: Secondary | ICD-10-CM | POA: Insufficient documentation

## 2023-08-20 DIAGNOSIS — R079 Chest pain, unspecified: Principal | ICD-10-CM

## 2023-08-20 DIAGNOSIS — Z79899 Other long term (current) drug therapy: Secondary | ICD-10-CM | POA: Diagnosis not present

## 2023-08-20 DIAGNOSIS — I200 Unstable angina: Secondary | ICD-10-CM | POA: Diagnosis not present

## 2023-08-20 DIAGNOSIS — K219 Gastro-esophageal reflux disease without esophagitis: Secondary | ICD-10-CM | POA: Diagnosis not present

## 2023-08-20 DIAGNOSIS — F319 Bipolar disorder, unspecified: Secondary | ICD-10-CM | POA: Diagnosis present

## 2023-08-20 DIAGNOSIS — Z794 Long term (current) use of insulin: Secondary | ICD-10-CM | POA: Insufficient documentation

## 2023-08-20 DIAGNOSIS — K760 Fatty (change of) liver, not elsewhere classified: Secondary | ICD-10-CM | POA: Insufficient documentation

## 2023-08-20 DIAGNOSIS — I480 Paroxysmal atrial fibrillation: Secondary | ICD-10-CM | POA: Diagnosis not present

## 2023-08-20 DIAGNOSIS — E1165 Type 2 diabetes mellitus with hyperglycemia: Secondary | ICD-10-CM | POA: Insufficient documentation

## 2023-08-20 DIAGNOSIS — E1169 Type 2 diabetes mellitus with other specified complication: Secondary | ICD-10-CM | POA: Diagnosis not present

## 2023-08-20 DIAGNOSIS — I2510 Atherosclerotic heart disease of native coronary artery without angina pectoris: Secondary | ICD-10-CM | POA: Insufficient documentation

## 2023-08-20 DIAGNOSIS — I10 Essential (primary) hypertension: Secondary | ICD-10-CM | POA: Diagnosis present

## 2023-08-20 DIAGNOSIS — Z8543 Personal history of malignant neoplasm of ovary: Secondary | ICD-10-CM | POA: Diagnosis not present

## 2023-08-20 LAB — CBC WITH DIFFERENTIAL/PLATELET
Abs Immature Granulocytes: 0.06 K/uL (ref 0.00–0.07)
Abs Immature Granulocytes: 0.03 K/uL (ref 0.00–0.07)
Basophils Absolute: 0.1 K/uL (ref 0.0–0.1)
Basophils Absolute: 0 K/uL (ref 0.0–0.1)
Basophils Relative: 1 %
Basophils Relative: 1 %
Eosinophils Absolute: 0.3 K/uL (ref 0.0–0.5)
Eosinophils Absolute: 0.2 K/uL (ref 0.0–0.5)
Eosinophils Relative: 3 %
Eosinophils Relative: 4 %
HCT: 40 % (ref 36.0–46.0)
HCT: 39.1 % (ref 36.0–46.0)
Hemoglobin: 13 g/dL (ref 12.0–15.0)
Hemoglobin: 12.6 g/dL (ref 12.0–15.0)
Immature Granulocytes: 1 %
Immature Granulocytes: 1 %
Lymphocytes Relative: 29 %
Lymphocytes Relative: 29 %
Lymphs Abs: 2.3 K/uL (ref 0.7–4.0)
Lymphs Abs: 1.7 K/uL (ref 0.7–4.0)
MCH: 27.6 pg (ref 26.0–34.0)
MCH: 27.3 pg (ref 26.0–34.0)
MCHC: 32.5 g/dL (ref 30.0–36.0)
MCHC: 32.2 g/dL (ref 30.0–36.0)
MCV: 84.9 fL (ref 80.0–100.0)
MCV: 84.8 fL (ref 80.0–100.0)
Monocytes Absolute: 0.4 K/uL (ref 0.1–1.0)
Monocytes Absolute: 0.2 K/uL (ref 0.1–1.0)
Monocytes Relative: 5 %
Monocytes Relative: 4 %
Neutro Abs: 4.8 K/uL (ref 1.7–7.7)
Neutro Abs: 3.7 K/uL (ref 1.7–7.7)
Neutrophils Relative %: 61 %
Neutrophils Relative %: 61 %
Platelets: 197 K/uL (ref 150–400)
Platelets: 183 K/uL (ref 150–400)
RBC: 4.71 MIL/uL (ref 3.87–5.11)
RBC: 4.61 MIL/uL (ref 3.87–5.11)
RDW: 13.9 % (ref 11.5–15.5)
RDW: 13.8 % (ref 11.5–15.5)
WBC: 7.9 K/uL (ref 4.0–10.5)
WBC: 6 K/uL (ref 4.0–10.5)
nRBC: 0 % (ref 0.0–0.2)
nRBC: 0 % (ref 0.0–0.2)

## 2023-08-20 LAB — COMPREHENSIVE METABOLIC PANEL WITH GFR
ALT: 27 U/L (ref 0–44)
ALT: 27 U/L (ref 0–44)
AST: 38 U/L (ref 15–41)
AST: 30 U/L (ref 15–41)
Albumin: 3.5 g/dL (ref 3.5–5.0)
Albumin: 3.4 g/dL — ABNORMAL LOW (ref 3.5–5.0)
Alkaline Phosphatase: 92 U/L (ref 38–126)
Alkaline Phosphatase: 91 U/L (ref 38–126)
Anion gap: 11 (ref 5–15)
Anion gap: 8 (ref 5–15)
BUN: 7 mg/dL (ref 6–20)
BUN: 6 mg/dL (ref 6–20)
CO2: 23 mmol/L (ref 22–32)
CO2: 27 mmol/L (ref 22–32)
Calcium: 9.4 mg/dL (ref 8.9–10.3)
Calcium: 9.3 mg/dL (ref 8.9–10.3)
Chloride: 102 mmol/L (ref 98–111)
Chloride: 103 mmol/L (ref 98–111)
Creatinine, Ser: 0.68 mg/dL (ref 0.44–1.00)
Creatinine, Ser: 0.68 mg/dL (ref 0.44–1.00)
GFR, Estimated: >60 mL/min (ref >60)
GFR, Estimated: >60 mL/min (ref >60)
Glucose, Bld: 214 mg/dL — ABNORMAL HIGH (ref 70–99)
Glucose, Bld: 150 mg/dL — ABNORMAL HIGH (ref 70–99)
Potassium: 3.7 mmol/L (ref 3.5–5.1)
Potassium: 4 mmol/L (ref 3.5–5.1)
Sodium: 136 mmol/L (ref 135–145)
Sodium: 138 mmol/L (ref 135–145)
Total Bilirubin: 0.6 mg/dL (ref 0.0–1.2)
Total Bilirubin: 0.6 mg/dL (ref 0.0–1.2)
Total Protein: 6.8 g/dL (ref 6.5–8.1)
Total Protein: 6.6 g/dL (ref 6.5–8.1)

## 2023-08-20 LAB — URINALYSIS, ROUTINE W REFLEX MICROSCOPIC
Bacteria, UA: NONE SEEN
Bilirubin Urine: NEGATIVE
Glucose, UA: >=500 mg/dL — AB
Hgb urine dipstick: NEGATIVE
Ketones, ur: NEGATIVE mg/dL
Leukocytes,Ua: NEGATIVE
Nitrite: NEGATIVE
Protein, ur: NEGATIVE mg/dL
Specific Gravity, Urine: >1.046 — ABNORMAL HIGH (ref 1.005–1.030)
pH: 5 (ref 5.0–8.0)

## 2023-08-20 LAB — TROPONIN I (HIGH SENSITIVITY)
Troponin I (High Sensitivity): 3 ng/L (ref <18)
Troponin I (High Sensitivity): 3 ng/L (ref <18)

## 2023-08-20 LAB — GLUCOSE, CAPILLARY: Glucose-Capillary: 125 mg/dL — ABNORMAL HIGH (ref 70–99)

## 2023-08-20 LAB — LIPASE, BLOOD: Lipase: 27 U/L (ref 11–51)

## 2023-08-20 LAB — CBG MONITORING, ED
Glucose-Capillary: 147 mg/dL — ABNORMAL HIGH (ref 70–99)
Glucose-Capillary: 174 mg/dL — ABNORMAL HIGH (ref 70–99)

## 2023-08-20 MED ORDER — ROSUVASTATIN CALCIUM 5 MG PO TABS
10.0000 mg | ORAL_TABLET | Freq: Every day | ORAL | Status: DC
  Administered 2023-08-20: 10 mg via ORAL
  Filled 2023-08-20: qty 2

## 2023-08-20 MED ORDER — LOXAPINE SUCCINATE 5 MG PO CAPS: 10.0000 mg | ORAL_CAPSULE | Freq: Two times a day (BID) | ORAL | Status: DC

## 2023-08-20 MED ORDER — KETOROLAC TROMETHAMINE 15 MG/ML IJ SOLN
15.0000 mg | Freq: Once | INTRAMUSCULAR | Status: AC
  Administered 2023-08-20: 15 mg via INTRAVENOUS
  Filled 2023-08-20: qty 1

## 2023-08-20 MED ORDER — PANTOPRAZOLE SODIUM 40 MG PO TBEC
40.0000 mg | DELAYED_RELEASE_TABLET | Freq: Two times a day (BID) | ORAL | Status: DC
  Administered 2023-08-20: 40 mg via ORAL
  Filled 2023-08-20: qty 1

## 2023-08-20 MED ORDER — INSULIN ASPART 100 UNIT/ML IJ SOLN
0.0000 [IU] | INTRAMUSCULAR | Status: DC
  Administered 2023-08-20: 1 [IU] via SUBCUTANEOUS
  Administered 2023-08-20: 2 [IU] via SUBCUTANEOUS

## 2023-08-20 MED ORDER — NITROGLYCERIN 0.4 MG SL SUBL: 0.4000 mg | SUBLINGUAL_TABLET | SUBLINGUAL | Status: DC | PRN

## 2023-08-20 MED ORDER — NITROGLYCERIN 0.4 MG SL SUBL
SUBLINGUAL_TABLET | SUBLINGUAL | Status: AC
  Filled 2023-08-20: qty 1

## 2023-08-20 MED ORDER — MORPHINE SULFATE (PF) 4 MG/ML IV SOLN
4.0000 mg | Freq: Once | INTRAVENOUS | Status: AC
  Administered 2023-08-20: 4 mg via INTRAVENOUS
  Filled 2023-08-20: qty 1

## 2023-08-20 MED ORDER — IOHEXOL 350 MG/ML SOLN
100.0000 mL | Freq: Once | INTRAVENOUS | Status: AC | PRN
  Administered 2023-08-20: 100 mL via INTRAVENOUS

## 2023-08-20 MED ORDER — LURASIDONE HCL 40 MG PO TABS
60.0000 mg | ORAL_TABLET | Freq: Every day | ORAL | Status: DC
  Administered 2023-08-20: 60 mg via ORAL
  Filled 2023-08-20: qty 1

## 2023-08-20 MED ORDER — LIDOCAINE VISCOUS HCL 2 % MT SOLN
15.0000 mL | Freq: Once | OROMUCOSAL | Status: AC
  Administered 2023-08-20: 15 mL via OROMUCOSAL
  Filled 2023-08-20: qty 15

## 2023-08-20 MED ORDER — IOHEXOL 350 MG/ML SOLN
95.0000 mL | Freq: Once | INTRAVENOUS | Status: AC | PRN
  Administered 2023-08-20: 95 mL via INTRAVENOUS

## 2023-08-20 MED ORDER — NEBIVOLOL HCL 2.5 MG PO TABS: 2.5000 mg | ORAL_TABLET | Freq: Every day | ORAL | Status: DC

## 2023-08-20 MED ORDER — NEBIVOLOL HCL 2.5 MG PO TABS
2.5000 mg | ORAL_TABLET | Freq: Every day | ORAL | Status: DC
  Administered 2023-08-20: 2.5 mg via ORAL
  Filled 2023-08-20: qty 1

## 2023-08-20 MED ORDER — ALUM & MAG HYDROXIDE-SIMETH 200-200-20 MG/5ML PO SUSP
30.0000 mL | Freq: Once | ORAL | Status: AC
  Administered 2023-08-20: 30 mL via ORAL
  Filled 2023-08-20: qty 30

## 2023-08-20 MED ORDER — LURASIDONE HCL 60 MG PO TABS
60.0000 mg | ORAL_TABLET | Freq: Every day | ORAL | Status: DC
  Filled 2023-08-20: qty 1

## 2023-08-20 MED ORDER — DULOXETINE HCL 60 MG PO CPEP: 60.0000 mg | ORAL_CAPSULE | Freq: Every day | ORAL | Status: DC

## 2023-08-20 MED ORDER — APIXABAN 5 MG PO TABS
5.0000 mg | ORAL_TABLET | Freq: Two times a day (BID) | ORAL | Status: DC
  Administered 2023-08-20: 5 mg via ORAL
  Filled 2023-08-20: qty 1

## 2023-08-20 MED ORDER — NITROGLYCERIN 0.4 MG SL SUBL
0.8000 mg | SUBLINGUAL_TABLET | SUBLINGUAL | Status: AC | PRN
Start: 2023-08-20 — End: 2023-08-20
  Administered 2023-08-20: 0.8 mg via SUBLINGUAL
  Filled 2023-08-20: qty 2

## 2023-08-20 MED ORDER — INSULIN GLARGINE-YFGN 100 UNIT/ML ~~LOC~~ SOLN
5.0000 [IU] | Freq: Every day | SUBCUTANEOUS | Status: DC
  Administered 2023-08-20: 5 [IU] via SUBCUTANEOUS
  Filled 2023-08-20 (×2): qty 0.05

## 2023-08-20 MED ORDER — FLECAINIDE ACETATE 50 MG PO TABS
50.0000 mg | ORAL_TABLET | Freq: Two times a day (BID) | ORAL | Status: DC
  Administered 2023-08-20: 50 mg via ORAL
  Filled 2023-08-20: qty 1

## 2023-08-20 NOTE — Progress Notes (Addendum)
 [facility]                                                                              [employer], is a 58 y.o. female, DOB - May 28, 1965, FMW:[id number] Admit date - 08/20/2023    Outpatient Primary MD for the patient is [name], PA-C  LOS - 0  days  Chief Complaint  Patient presents with   Chest Pain       Brief summary   Patient is a 58 year old female with paroxysmal A-fib s/p ablation in March 2025, HTN, DM type II, bipolar disorder, hyperlipidemia presented with left-sided chest pain for last 2 days, radiating to the left arm.  Per patient, pain present even at rest, no associated shortness of breath.  Coronary CT done in March 2025 showed 0 calcium  score. CT angiogram of the chest negative for PE, EKG showed no acute ST-T wave changes suggestive of ischemia Troponin flat 3-3 Patient admitted for observation and workup due to high risk factors  Assessment & Plan    Principal Problem:   Chest pain -Chest pain is improving however given the risk factors of HTN, HLP, diabetes, obesity, typical features, cardiology consulted -Follow cardiology recommendations, follow 2D echo   Active Problems:    Paroxysmal atrial fibrillation (HCC) - Status post ablation in March 2025, currently NSR - Continue flecainide , beta-blocker, eliquis     Bipolar disorder (HCC) -Continue Cymbalta      Type 2 diabetes mellitus, uncontrolled with hyperglycemia (HCC) - Currently n.p.o., continue Lantus , sliding scale insulin  - Hemoglobin A1c 8.3 on 07/04/2023     Hyperlipidemia associated with type 2 diabetes mellitus (HCC) -Continue statin    Essential hypertension - Continue beta-blocker  GERD - Continue PPI   Obesity class I Estimated body mass index is 34.01 kg/m as calculated from the following:   Height as of this encounter: 5' 1 (1.549 m).   Weight as of this encounter: 81.6 kg.  Code Status: Full CODE STATUS DVT Prophylaxis:   apixaban  (ELIQUIS )  tablet 5 mg   Level of Care: Level of care: Telemetry Cardiac Family Communication: Updated patient Disposition Plan:      Remains inpatient appropriate: Awaiting cardiology evaluation, echo   Procedures:    Consultants:   Cardiology  Antimicrobials:   Anti-infectives (From admission, onward)    None          Medications  apixaban   5 mg Oral BID   DULoxetine   60 mg Oral QHS   flecainide   50 mg Oral BID   insulin  aspart  0-9 Units Subcutaneous Q4H   insulin  glargine-yfgn  5 Units Subcutaneous Daily   loxapine   10 mg Oral BID   nebivolol   2.5 mg Oral QHS   pantoprazole   40 mg Oral BID   rosuvastatin   10 mg Oral Daily      Subjective:   Morgan [name] was seen and examined today.  States chest pain is improving, currently 1-2/10.  No dizziness, lightheadedness, shortness of breath, fever chills. No acute events overnight.    Objective:   Vitals:   08/20/23 0358 08/20/23 0430 08/20/23 0500 08/20/23 0700  BP: (!) 172/82  132/64 (!) 170/87 119/62  Pulse: 68 71 78 65  Resp: 16 12 17 12   Temp:    98.6 F (37 C)  SpO2: 98% 95% 95% 97%  Weight:      Height:       No intake or output data in the 24 hours ending 08/20/23 0857   Wt Readings from Last 3 Encounters:  08/20/23 81.6 kg  08/16/23 87.6 kg  07/04/23 85.7 kg     Exam General: Alert and oriented x 3, NAD Cardiovascular: S1 S2 auscultated,  RRR Respiratory: Clear to auscultation bilaterally, no wheezing Gastrointestinal: Soft, nontender, nondistended, + bowel sounds Ext: no pedal edema bilaterally Neuro: no new deficits Psych: Normal affect     Data Reviewed:  I have personally reviewed following labs    CBC Lab Results  Component Value Date   WBC 6.0 08/20/2023   RBC 4.61 08/20/2023   HGB 12.6 08/20/2023   HCT 39.1 08/20/2023   MCV 84.8 08/20/2023   MCH 27.3 08/20/2023   PLT 183 08/20/2023   MCHC 32.2 08/20/2023   RDW 13.8 08/20/2023   LYMPHSABS 1.7 08/20/2023   MONOABS 0.2  08/20/2023   EOSABS 0.2 08/20/2023   BASOSABS 0.0 08/20/2023     Last metabolic panel Lab Results  Component Value Date   NA 138 08/20/2023   K 4.0 08/20/2023   CL 103 08/20/2023   CO2 27 08/20/2023   BUN 6 08/20/2023   CREATININE 0.68 08/20/2023   GLUCOSE 150 (H) 08/20/2023   GFRNONAA >60 08/20/2023   GFRAA 117 03/24/2020   CALCIUM  9.3 08/20/2023   PHOS 2.5 04/29/2022   PROT 6.6 08/20/2023   ALBUMIN  3.4 (L) 08/20/2023   LABGLOB 2.7 03/30/2023   AGRATIO 1.4 06/23/2022   BILITOT 0.6 08/20/2023   ALKPHOS 91 08/20/2023   AST 30 08/20/2023   ALT 27 08/20/2023   ANIONGAP 8 08/20/2023    CBG (last 3)  Recent Labs    08/20/23 0545 08/20/23 0740  GLUCAP 147* 174*      Coagulation Profile: No results for input(s): INR, PROTIME in the last 168 hours.   Radiology Studies: I have personally reviewed the imaging studies  CT Angio Chest/Abd/Pel for Dissection W and/or Wo Contrast Result Date: 08/20/2023 CLINICAL DATA:  Acute aortic syndrome suspected.  Chest pain. EXAM: CT ANGIOGRAPHY CHEST, ABDOMEN AND PELVIS TECHNIQUE: Non-contrast CT of the chest was initially obtained. Multidetector CT imaging through the chest, abdomen and pelvis was performed using the standard protocol during bolus administration of intravenous contrast. Multiplanar reconstructed images and MIPs were obtained and reviewed to evaluate the vascular anatomy. RADIATION DOSE REDUCTION: This exam was performed according to the departmental dose-optimization program which includes automated exposure control, adjustment of the mA and/or kV according to patient size and/or use of iterative reconstruction technique. CONTRAST:  OMNIPAQUE  IOHEXOL  350 MG/ML SOLN COMPARISON:  Chest radiograph 08/20/2023. CTA abdomen and pelvis 06/27/2023. CTA chest abdomen and pelvis 04/17/2023 FINDINGS: CTA CHEST FINDINGS Cardiovascular: Unenhanced images of the chest demonstrate minimal scattered aortic calcification. No evidence  of acute intramural hematoma. Images obtained during arterial phase after intravenous contrast material administration demonstrate normal caliber thoracic aorta. No aortic dissection. Great vessel origins are patent. Normal heart size. No pericardial effusions. Central pulmonary arteries are well opacified. No evidence of significant pulmonary embolus. Mediastinum/Nodes: No enlarged mediastinal, hilar, or axillary lymph nodes. Thyroid  gland, trachea, and esophagus demonstrate no significant findings. Lungs/Pleura: Lungs are clear. No pleural effusion or pneumothorax. Musculoskeletal: Postoperative changes in  the cervical spine. Degenerative changes in the thoracic spine. Normal alignment. No acute bony abnormalities. Review of the MIP images confirms the above findings. CTA ABDOMEN AND PELVIS FINDINGS VASCULAR Aorta: Normal caliber aorta without aneurysm, dissection, vasculitis or significant stenosis. Celiac: Patent without evidence of aneurysm, dissection, vasculitis or significant stenosis. SMA: Patent without evidence of aneurysm, dissection, vasculitis or significant stenosis. Renals: Both renal arteries are patent without evidence of aneurysm, dissection, vasculitis, fibromuscular dysplasia or significant stenosis. IMA: Patent without evidence of aneurysm, dissection, vasculitis or significant stenosis. Inflow: Patent without evidence of aneurysm, dissection, vasculitis or significant stenosis. Veins: No obvious venous abnormality within the limitations of this arterial phase study. Review of the MIP images confirms the above findings. NON-VASCULAR Hepatobiliary: Diffuse fatty infiltration of the liver. Focal lesion in segment 6 measuring 1.8 cm diameter with peripheral arterial flow. This is unchanged since previous studies. Previous characterization is hemangioma. No specific imaging follow-up is indicated. No bile duct dilatation. Gallbladder is normal. Pancreas: Unremarkable. No pancreatic ductal  dilatation or surrounding inflammatory changes. Spleen: Normal in size without focal abnormality. Adrenals/Urinary Tract: No adrenal gland nodules. Pelvic left kidney. Kidneys are otherwise unremarkable. Nephrograms are symmetrical. No hydronephrosis or hydroureter. Bladder is normal. Stomach/Bowel: Stomach, small bowel, and colon are not abnormally distended. No wall thickening or inflammatory stranding are identified. Appendix is normal. Lymphatic: No significant lymphadenopathy. Reproductive: Status post hysterectomy. No adnexal masses. Other: No abdominal wall hernia or abnormality. No abdominopelvic ascites. Musculoskeletal: Postoperative changes in the lower lumbar spine. Sclerosis in the L2 vertebral body is unchanged since prior study. Review of the MIP images confirms the above findings. IMPRESSION: 1. No evidence of aneurysm or dissection involving thoracic or abdominal aorta. 2. No evidence of significant pulmonary embolus. 3. No acute process demonstrated in the abdomen or pelvis. 4. Fatty infiltration of the liver. 5. Stable focal liver lesion consistent with hemangioma. 6. Pelvic left kidney.  No hydronephrosis. Electronically Signed   By: Elsie Gravely M.D.   On: 08/20/2023 03:33   DG Chest Portable 1 View Result Date: 08/20/2023 CLINICAL DATA:  Chest pain EXAM: PORTABLE CHEST 1 VIEW COMPARISON:  06/04/2023 FINDINGS: Heart size and pulmonary vascularity are normal. Lungs are clear. No pleural effusion or pneumothorax. Mediastinal contours appear intact. Postoperative changes in the cervical spine. IMPRESSION: No active disease. Electronically Signed   By: Elsie Gravely M.D.   On: 08/20/2023 02:55       Candace Begue M.D. [facility] 08/20/2023, 8:57 AM  Available via Epic secure chat 7am-7pm After 7 pm, please refer to night coverage provider listed on amion.

## 2023-08-20 NOTE — Discharge Summary (Signed)
 Physician Discharge Summary   Patient: Morgan [name] MRN: [id number] DOB: Nov 11, 1965  Admit date:     08/20/2023  Discharge date: 08/20/23  Discharge Physician: Nydia Distance, MD    PCP: [name], PA-C   Recommendations at discharge:    Outpatient follow up with PCP in 2 weeks   Discharge Diagnoses:     Chest pain, atypical    Bipolar disorder (HCC)   Type 2 diabetes mellitus with other specified complication (HCC)   Hyperlipidemia associated with type 2 diabetes mellitus (HCC)   Paroxysmal atrial fibrillation [facility])   Essential hypertension   Hospital Course:  Patient is a 58 year old female with paroxysmal A-fib s/p ablation in March 2025, HTN, DM type II, bipolar disorder, hyperlipidemia presented with left-sided chest pain for last 2 days, radiating to the left arm.  Per patient, pain present even at rest, no associated shortness of breath.  Coronary CT done in March 2025 showed 0 calcium  score. CT angiogram of the chest negative for PE, EKG showed no acute ST-T wave changes suggestive of ischemia Troponin flat 3-3 Patient was admitted for observation and workup due to high risk factors   Assessment and Plan:  Chest pain -Chest pain is improving however given the risk factors of HTN, HLP, diabetes, obesity, typical features, cardiology consulted -[place] Coronary CT which showed Coronary calcium  score of 0.  Normal coronary origin with right dominance. Nonobstructive CAD, with noncalcified plaque causing minimal (0-24%) stenosis in left main, proximal RCA, and proximal LCX Cleared by cardiology to discharge home  Continue PPI     Paroxysmal atrial fibrillation [facility]) - Status post ablation in March 2025, currently NSR - Continue flecainide , beta-blocker, eliquis      Bipolar disorder (HCC) -Continue Cymbalta        Type 2 diabetes mellitus, uncontrolled with hyperglycemia (HCC) -continue out patient regimen.  - Hemoglobin A1c 8.3 on 07/04/2023        Hyperlipidemia associated with type 2 diabetes mellitus (HCC) -Continue statin     Essential hypertension - Continue beta-blocker   GERD - Continue PPI Obesity class I Estimated body mass index is 34.01 kg/m as calculated from the following:   Height as of this encounter: 5' 1 (1.549 m). Weight as of this encounter: 81.6 kg.      Pain control - [place]  Controlled Substance Reporting System database was reviewed. and patient was instructed, not to drive, operate heavy machinery, perform activities at heights, swimming or participation in water activities or provide baby-sitting services while on Pain, Sleep and Anxiety Medications; until their outpatient Physician has advised to do so again. Also recommended to not to take more than prescribed Pain, Sleep and Anxiety Medications.  Consultants: cardiology  Procedures performed: coronary CT   Disposition: Home Diet recommendation:  Discharge Diet Orders (From admission, onward)     Start     Ordered   08/20/23 0000  Diet - low sodium heart healthy        08/20/23 1601            DISCHARGE MEDICATION: Allergies as of 08/20/2023       Reactions   Clindamycin/lincomycin Rash   Diffuse drug reaction eruption   Lamictal [lamotrigine] Rash   Banana Diarrhea   Lipitor [atorvastatin ] Other (See Comments)   Severe fatigue   Pineapple Diarrhea        Medication List     TAKE these medications    acetaminophen  500 MG tablet Commonly known as: TYLENOL  Take 1,000 mg  by mouth every 6 (six) hours as needed for mild pain (pain score 1-3).   DULoxetine  60 MG capsule Commonly known as: CYMBALTA  Take 120 mg by mouth at bedtime.   Eliquis  5 MG Tabs tablet Generic drug: apixaban  TAKE 1 TABLET BY MOUTH TWICE A DAY   flecainide  50 MG tablet Commonly known as: TAMBOCOR  Take 1 tablet (50 mg total) by mouth 2 (two) times daily.   insulin  glargine 100 UNIT/ML Solostar Pen Commonly known as: LANTUS  Inject 10 Units into  the skin daily. Titrate up to goal of fasting glucose level between 100-140. What changed:  how much to take additional instructions   Lurasidone  HCl 60 MG Tabs Take 60 mg by mouth daily at 12 noon.   nebivolol  2.5 MG tablet Commonly known as: BYSTOLIC  Take 1 tablet (2.5 mg total) by mouth at bedtime.   omeprazole  20 MG capsule Commonly known as: PRILOSEC  Take 20 mg by mouth daily as needed (indigestion / heart burn).   rosuvastatin  10 MG tablet Commonly known as: CRESTOR  TAKE 1 TABLET (10 MG TOTAL) BY MOUTH DAILY.   tiZANidine  4 MG tablet Commonly known as: ZANAFLEX  Take 4 mg by mouth at bedtime as needed for muscle spasms.   Xigduo  XR 11-998 MG Tb24 Generic drug: Dapagliflozin  Pro-metFORMIN  ER TAKE 1 TABLET BY MOUTH EVERYDAY AT BEDTIME What changed: See the new instructions.        Follow-up Information     [name], [place]. Schedule an appointment as soon as possible for a visit in 2 week(s).   Specialty: Physician Assistant Why: for hospital follow-up Contact information: [address] [place] [address] [phone]                Discharge Exam: Fredricka Weights   08/20/23 0228 08/20/23 1244  Weight: 81.6 kg 86 kg   S: No acute complains, cleared by cardiolog to dischage home   BP 111/66 (BP Location: Right Arm)   Pulse 64   Temp 97.8 F (36.6 C) (Oral)   Resp 16   Ht 5' 1 (1.549 m)   Wt 86 kg   LMP 05/12/2011   SpO2 97%   BMI 35.84 kg/m   Physical Exam General: Alert and oriented x 3, NAD Cardiovascular: S1 S2 clear, RRR.  Respiratory: CTAB, no wheezing, rales or rhonchi Gastrointestinal: Soft, nontender, nondistended, NBS Ext: no pedal edema bilaterally Neuro: no new deficits Skin: No rashes Psych: Normal affect    Condition at discharge: fair  The results of significant diagnostics from this hospitalization (including imaging, microbiology, ancillary and laboratory) are listed below for reference.   Imaging  Studies: CT CORONARY MORPH W/CTA COR W/SCORE W/CA W/CM &/OR WO/CM Addendum Date: 08/20/2023 ADDENDUM REPORT: 08/20/2023 15:44 CLINICAL DATA:  76F p/w chest pain EXAM: Cardiac/Coronary CTA TECHNIQUE: The patient was scanned on a [employer]. FINDINGS: A 100 kV prospective scan was triggered in the descending thoracic aorta at 111 HU's. Axial non-contrast 3 mm slices were carried out through the heart. The data set was analyzed on a dedicated work station and scored using the Agatson method. Gantry rotation speed was 250 msecs and collimation was .6 mm. No beta blockade and 0.8 mg of sl NTG was given. The 3D data set was reconstructed in 5% intervals of the 35-75% of the R-R cycle. Phases were analyzed on a dedicated work station using MPR, MIP and VRT modes. The patient received 100 cc of contrast. Coronary Arteries:  Normal coronary origin.  Right dominance. RCA is a large dominant artery that gives rise to PDA and PLA. Noncalcified plaque in proximal RCA causes 0-24% stenosis Left main is a large artery that gives rise to LAD and LCX arteries. Noncalcified plaque in left main causes 0-24% stenosis LAD is a large vessel that has no plaque. LCX is a non-dominant artery. Noncalcified plaque in proximal LCX causes 0-24% stenosis There is a ramus branch that has no plaque Other findings: Left Ventricle: Normal size Left Atrium: Mild enlargement Pulmonary Veins: Normal configuration Right Ventricle: Normal size Right Atrium: Normal size Cardiac valves: No calcifications Thoracic aorta: Normal size Pulmonary Arteries: Normal size Systemic Veins: Normal drainage Pericardium: Normal thickness IMPRESSION: 1.  Coronary calcium  score of 0. 2.  Normal coronary origin with right dominance. 3. Nonobstructive CAD, with noncalcified plaque causing minimal (0-24%) stenosis in left main, proximal RCA, and proximal LCX CAD-RADS 1. Minimal non-obstructive CAD (0-24%). Consider non-atherosclerotic causes of chest pain.  Consider preventive therapy and risk factor modification. Electronically Signed   By: Lonni Nanas M.D.   On: 08/20/2023 15:44   Result Date: 08/20/2023 EXAM: OVER-READ INTERPRETATION  CT CHEST The following report is a limited chest CT over-read performed by radiologist Dr. Leita Birmingham of [facility] on 08/20/2023. This over-read does not include interpretation of cardiac or coronary anatomy or pathology. The coronary CTA interpretation by the cardiologist is attached. COMPARISON:  04/25/2023. FINDINGS: Vascular: The heart is enlarged and there is no pericardial effusion. Aorta and pulmonary trunk are normal in caliber. Mediastinum/Nodes: The esophagus is within normal limits. No lymphadenopathy is seen. Lungs/Pleura: No consolidation, effusion, or pneumothorax. Upper Abdomen: Fatty infiltration of the liver is noted. No acute abnormality. Musculoskeletal: Degenerative changes are present in the thoracic spine. No acute or suspicious osseous abnormality is seen. IMPRESSION: 1. No acute cardiopulmonary process. 2. Hepatic steatosis. Electronically Signed: By: Leita Birmingham M.D. On: 08/20/2023 14:30   CT Angio Chest/Abd/Pel for Dissection W and/or Wo Contrast Result Date: 08/20/2023 CLINICAL DATA:  Acute aortic syndrome suspected.  Chest pain. EXAM: CT ANGIOGRAPHY CHEST, ABDOMEN AND PELVIS TECHNIQUE: Non-contrast CT of the chest was initially obtained. Multidetector CT imaging through the chest, abdomen and pelvis was performed using the standard protocol during bolus administration of intravenous contrast. Multiplanar reconstructed images and MIPs were obtained and reviewed to evaluate the vascular anatomy. RADIATION DOSE REDUCTION: This exam was performed according to the departmental dose-optimization program which includes automated exposure control, adjustment of the mA and/or kV according to patient size and/or use of iterative reconstruction technique. CONTRAST:  OMNIPAQUE  IOHEXOL   350 MG/ML SOLN COMPARISON:  Chest radiograph 08/20/2023. CTA abdomen and pelvis 06/27/2023. CTA chest abdomen and pelvis 04/17/2023 FINDINGS: CTA CHEST FINDINGS Cardiovascular: Unenhanced images of the chest demonstrate minimal scattered aortic calcification. No evidence of acute intramural hematoma. Images obtained during arterial phase after intravenous contrast material administration demonstrate normal caliber thoracic aorta. No aortic dissection. Great vessel origins are patent. Normal heart size. No pericardial effusions. Central pulmonary arteries are well opacified. No evidence of significant pulmonary embolus. Mediastinum/Nodes: No enlarged mediastinal, hilar, or axillary lymph nodes. Thyroid  gland, trachea, and esophagus demonstrate no significant findings. Lungs/Pleura: Lungs are clear. No pleural effusion or pneumothorax. Musculoskeletal: Postoperative changes in the cervical spine. Degenerative changes in the thoracic spine. Normal alignment. No acute bony abnormalities. Review of the MIP images confirms the above findings. CTA ABDOMEN AND PELVIS FINDINGS VASCULAR Aorta: Normal caliber aorta without aneurysm, dissection, vasculitis or significant stenosis. Celiac: Patent without evidence of  aneurysm, dissection, vasculitis or significant stenosis. SMA: Patent without evidence of aneurysm, dissection, vasculitis or significant stenosis. Renals: Both renal arteries are patent without evidence of aneurysm, dissection, vasculitis, fibromuscular dysplasia or significant stenosis. IMA: Patent without evidence of aneurysm, dissection, vasculitis or significant stenosis. Inflow: Patent without evidence of aneurysm, dissection, vasculitis or significant stenosis. Veins: No obvious venous abnormality within the limitations of this arterial phase study. Review of the MIP images confirms the above findings. NON-VASCULAR Hepatobiliary: Diffuse fatty infiltration of the liver. Focal lesion in segment 6 measuring 1.8  cm diameter with peripheral arterial flow. This is unchanged since previous studies. Previous characterization is hemangioma. No specific imaging follow-up is indicated. No bile duct dilatation. Gallbladder is normal. Pancreas: Unremarkable. No pancreatic ductal dilatation or surrounding inflammatory changes. Spleen: Normal in size without focal abnormality. Adrenals/Urinary Tract: No adrenal gland nodules. Pelvic left kidney. Kidneys are otherwise unremarkable. Nephrograms are symmetrical. No hydronephrosis or hydroureter. Bladder is normal. Stomach/Bowel: Stomach, small bowel, and colon are not abnormally distended. No wall thickening or inflammatory stranding are identified. Appendix is normal. Lymphatic: No significant lymphadenopathy. Reproductive: Status post hysterectomy. No adnexal masses. Other: No abdominal wall hernia or abnormality. No abdominopelvic ascites. Musculoskeletal: Postoperative changes in the lower lumbar spine. Sclerosis in the L2 vertebral body is unchanged since prior study. Review of the MIP images confirms the above findings. IMPRESSION: 1. No evidence of aneurysm or dissection involving thoracic or abdominal aorta. 2. No evidence of significant pulmonary embolus. 3. No acute process demonstrated in the abdomen or pelvis. 4. Fatty infiltration of the liver. 5. Stable focal liver lesion consistent with hemangioma. 6. Pelvic left kidney.  No hydronephrosis. Electronically Signed   By: Elsie Gravely M.D.   On: 08/20/2023 03:33   DG Chest Portable 1 View Result Date: 08/20/2023 CLINICAL DATA:  Chest pain EXAM: PORTABLE CHEST 1 VIEW COMPARISON:  06/04/2023 FINDINGS: Heart size and pulmonary vascularity are normal. Lungs are clear. No pleural effusion or pneumothorax. Mediastinal contours appear intact. Postoperative changes in the cervical spine. IMPRESSION: No active disease. Electronically Signed   By: Elsie Gravely M.D.   On: 08/20/2023 02:55    Microbiology: Results for orders  placed or performed during the hospital encounter of 09/07/22  SARS Coronavirus 2 by RT PCR (hospital order, performed in [facility] hospital lab) *cepheid single result test* Anterior Nasal Swab     Status: None   Collection Time: 09/07/22  2:29 PM   Specimen: Anterior Nasal Swab  Result Value Ref Range Status   SARS Coronavirus 2 by RT PCR NEGATIVE NEGATIVE Final    Comment: (NOTE) SARS-CoV-2 target nucleic acids are NOT DETECTED.  The SARS-CoV-2 RNA is generally detectable in upper and lower respiratory specimens during the acute phase of infection. The lowest concentration of SARS-CoV-2 viral copies this assay can detect is 250 copies / mL. A negative result does not preclude SARS-CoV-2 infection and should not be used as the sole basis for treatment or other patient management decisions.  A negative result may occur with improper specimen collection / handling, submission of specimen other than nasopharyngeal swab, presence of viral mutation(s) within the areas targeted by this assay, and inadequate number of viral copies (<250 copies / mL). A negative result must be combined with clinical observations, patient history, and epidemiological information.  Fact Sheet for Patients:   RoadLapTop.co.za  Fact Sheet for Healthcare Providers: http://kim-miller.com/  This test is not yet approved or  cleared by the [place]  FDA and has been authorized  for detection and/or diagnosis of SARS-CoV-2 by FDA under an Emergency Use Authorization (EUA).  This EUA will remain in effect (meaning this test can be used) for the duration of the COVID-19 declaration under Section 564(b)(1) of the Act, 21 U.S.C. section 360bbb-3(b)(1), unless the authorization is terminated or revoked sooner.  Performed at [facility], [address] [place], [place] [address]     Labs: CBC: Recent Labs  Lab 08/20/23 0243 08/20/23 0547   WBC 7.9 6.0  NEUTROABS 4.8 3.7  HGB 13.0 12.6  HCT 40.0 39.1  MCV 84.9 84.8  PLT 197 183   Basic Metabolic Panel: Recent Labs  Lab 08/20/23 0243 08/20/23 0547  NA 136 138  K 3.7 4.0  CL 102 103  CO2 23 27  GLUCOSE 214* 150*  BUN 7 6  CREATININE 0.68 0.68  CALCIUM  9.4 9.3   Liver Function Tests: Recent Labs  Lab 08/20/23 0243 08/20/23 0547  AST 38 30  ALT 27 27  ALKPHOS 92 91  BILITOT 0.6 0.6  PROT 6.8 6.6  ALBUMIN  3.5 3.4*   CBG: Recent Labs  Lab 08/20/23 0545 08/20/23 0740 08/20/23 1256  GLUCAP 147* 174* 125*    Discharge time spent: greater than 30 minutes.  Signed: Nydia Distance, MD [facility] 08/20/2023

## 2023-08-20 NOTE — Consult Note (Addendum)
 Cardiology Consultation   Morgan [name] ID: Morgan [name] MRN: [id number]; DOB: March 20, 1965  Admit date: 08/20/2023 Date of Consult: 08/20/2023  PCP:  Morgan [name]   [facility] Providers Cardiologist:  None  Electrophysiologist:  Morgan FORBES Furbish, MD       Morgan [name] Profile: Morgan [name] is a 58 y.o. female with a hx of Morgan [name] is a 58 y.o. female with h/o AF, AFL, HTN, HLD, GIB, ischemic colitis, DM II, bipolar disorder, chronic pain syndrome who is being seen 08/20/2023 for the evaluation of typical chest pain at the request of [facility].  History of Present Illness: Morgan [name] has history of afib/flutter, previously followed by Dr. Kelsie. In March of this year, Morgan [name] underwent afib ablation with Dr. Furbish. In afib clinic follow up after this, Morgan [name] reported noticing increased palpitations and chest pain. Episodes reported to occur several times per week, up to 3 min in duration. At this April follow up, Morgan [name] started on Flecainide  50mg  BID. Morgan [name] then admitted 5/12-5/13/25 in the setting of dark, tarry stools. Morgan [name] has a hx of gastritis and duodenal polyps. Hgb drift from ~13 to 11 gm. Morgan [name] was instructed to hold her Eliquis  for 5 days and then resume. No further bleeding since that time. In EP follow up 08/16/23, Morgan [name] continued to report ongoing symptoms of palpitations/AF with fluctuant HR reportedly ranging from 40s to 120s. EKG in clinic that day showed NSR. Due to her symptoms, a cardiac monitor was placed to further evaluate rhythm at time of Morgan [name] symptoms.  Morgan [name] presented to the ED overnight with symptoms of left sided chest pain x2 days, radiating to arm and back. In the ED, workup reassuring with no ischemic changes on ECG and negative troponin x2. CTA negative for aortic aneurysm or dissection.    On my exam, Morgan [name] further clarifies her symptoms which first occurred this past Thursday while walking around [place]. Discomfort at that  time described as a pressure that seemed to improve with rest. Reports a mild degree of associated lightheadedness. Following initial episode, Morgan [name] continued with intermittent discomfort both with exertion and at rest. Last night's symptoms were different with addition of sharp pain radiating into her arm. Morgan [name] states that overnight, arm pain was actually worse than chest pain. Morgan [name] is now feeling much better, attributes to the Toradol  Morgan [name] received.   Past Medical History:  Diagnosis Date   Anxiety    Atrial fibrillation (HCC)    Bipolar disorder (HCC)    Breast mass    right, removed and benign   Cancer (HCC) 09/16/2019   ovarian ca no radiation no chemo   Cervical dysplasia    Diabetes mellitus without complication (HCC)    Endometriosis    GERD (gastroesophageal reflux disease)    Hypercholesteremia    Hypertension    Migraine    Ovarian cancer (HCC)    Pelvic kidney    left   Postpartum depression    hx of    Past Surgical History:  Procedure Laterality Date   ATRIAL FIBRILLATION ABLATION N/A 05/16/2023   Procedure: ATRIAL FIBRILLATION ABLATION;  Surgeon: [name] Morgan FORBES, MD;  Location: [facility];  Service: Cardiovascular;  Laterality: N/A;   BACK SURGERY  02/16/2012   BREAST BIOPSY Right 2016   BREAST BIOPSY Right 2020   BREAST EXCISIONAL BIOPSY Right 2021   BREAST EXCISIONAL BIOPSY Right 2016   benign   BREAST LUMPECTOMY WITH RADIOACTIVE SEED LOCALIZATION Right 10/28/2014  Procedure: RIGHT BREAST LUMPECTOMY WITH RADIOACTIVE SEED LOCALIZATION;  Surgeon: Deward Null III, MD;  Location: [facility];  Service: General;  Laterality: Right;   BREAST LUMPECTOMY WITH RADIOACTIVE SEED LOCALIZATION Right 06/18/2019   Procedure: RIGHT BREAST RADIOACTIVE SEED LOCALIZATION LUMPECTOMY;  Surgeon: Null Deward MOULD, MD;  Location: [facility];  Service: General;  Laterality: Right;   BREAST SURGERY N/A    Phreesia 07/30/2019   CESAREAN SECTION   02/15/2001   COLONOSCOPY WITH PROPOFOL  N/A 05/01/2018   Procedure: COLONOSCOPY WITH PROPOFOL ;  Surgeon: Therisa Bi, MD;  Location: [facility] ENDOSCOPY;  Service: Gastroenterology;  Laterality: N/A;   COLPOSCOPY     ESOPHAGOGASTRODUODENOSCOPY (EGD) WITH PROPOFOL  N/A 05/01/2018   Procedure: ESOPHAGOGASTRODUODENOSCOPY (EGD) WITH PROPOFOL ;  Surgeon: Therisa Bi, MD;  Location: [facility] ENDOSCOPY;  Service: Gastroenterology;  Laterality: N/A;   ESOPHAGOGASTRODUODENOSCOPY (EGD) WITH PROPOFOL  N/A 11/13/2018   Procedure: ESOPHAGOGASTRODUODENOSCOPY (EGD) WITH PROPOFOL ;  Surgeon: Therisa Bi, MD;  Location: [facility];  Service: Gastroenterology;  Laterality: N/A;   GYNECOLOGIC CRYOSURGERY     NECK SURGERY  02/15/2010   PELVIC LAPAROSCOPY  02/15/1989   TOTAL ABDOMINAL HYSTERECTOMY N/A    Phreesia 07/30/2019       Scheduled Meds:  apixaban   5 mg Oral BID   DULoxetine   60 mg Oral QHS   flecainide   50 mg Oral BID   insulin  aspart  0-9 Units Subcutaneous Q4H   insulin  glargine-yfgn  5 Units Subcutaneous Daily   loxapine   10 mg Oral BID   nebivolol   2.5 mg Oral QHS   pantoprazole   40 mg Oral BID   rosuvastatin   10 mg Oral Daily    PRN Meds: nitroGLYCERIN   Allergies:    Allergies  Allergen Reactions   Clindamycin/Lincomycin Rash    Diffuse drug reaction eruption   Lamictal [Lamotrigine] Rash   Banana Diarrhea   Lipitor [Atorvastatin ] Other (See Comments)    Severe fatigue   Pineapple Diarrhea    Social History:   Social History   Tobacco Use   Smoking status: Never    Passive exposure: Never   Smokeless tobacco: Never   Tobacco comments:    Never smoke 07/01/21  Substance Use Topics   Alcohol use: No     Family History:    Family History  Problem Relation Age of Onset   Diabetes Mother    Breast cancer Mother 69   Thyroid  disease Mother    Depression Mother    Hypertension Father    Heart disease Father    Heart attack Father 38       Two instances, first at age 22    Stroke Father    Alcohol abuse Father    Throat cancer Father    Diabetes Maternal Grandmother    Depression Maternal Grandmother    Uterine cancer Maternal Grandmother      ROS:  Please see the history of present illness. All other ROS reviewed and negative.     Physical Exam/Data: Vitals:   08/20/23 0358 08/20/23 0430 08/20/23 0500 08/20/23 0700  BP: (!) 172/82 132/64 (!) 170/87 119/62  Pulse: 68 71 78 65  Resp: 16 12 17 12   Temp:    98.6 F (37 C)  SpO2: 98% 95% 95% 97%  Weight:      Height:       No intake or output data in the 24 hours ending 08/20/23 0822    08/20/2023    2:28 AM 08/16/2023   10:10 AM 07/04/2023  1:21 PM  Last 3 Weights  Weight (lbs) 180 lb 193 lb 3.2 oz 189 lb 0.6 oz  Weight (kg) 81.647 kg 87.635 kg 85.748 kg     Body mass index is 34.01 kg/m.  General:  Well nourished, well developed, in no acute distress HEENT: normal Neck: no JVD Vascular: No carotid bruits; Distal pulses 2+ bilaterally Cardiac:  normal S1, S2; RRR; 2/6 murmur at RUSB Lungs:  clear to auscultation bilaterally, no wheezing, rhonchi or rales  Abd: soft, nontender, no hepatomegaly  Ext: no edema Musculoskeletal:  No deformities, BUE and BLE strength normal and equal Skin: warm and dry  Neuro:  CNs 2-12 intact, no focal abnormalities noted Psych:  Normal affect   EKG:  The EKG was personally reviewed and demonstrates:  sinus rhythm, no acute ischemic changes, HR 65. Telemetry:  Telemetry was personally reviewed and demonstrates:  sinus rhythm, no significant arrhythmia  Relevant CV Studies:  04/25/23 CT cardiac morph/pulm vein study  IMPRESSION: 1. There is normal pulmonary vein drainage into the left atrium with ostial measurements above.   2. There is no thrombus in the left atrial appendage.   3. The esophagus runs in the left atrial midline and is not in proximity to any of the pulmonary vein ostia.   4. No PFO/ASD.   5. Normal coronary origin. Right  dominance.   6. CAC score of 0 which is 1st percentile for age-, race-, and sex-matched controls.  Laboratory Data: High Sensitivity Troponin:   Recent Labs  Lab 08/20/23 0243 08/20/23 0424  TROPONINIHS 3 3     Chemistry Recent Labs  Lab 08/20/23 0243 08/20/23 0547  NA 136 138  K 3.7 4.0  CL 102 103  CO2 23 27  GLUCOSE 214* 150*  BUN 7 6  CREATININE 0.68 0.68  CALCIUM  9.4 9.3  GFRNONAA >60 >60  ANIONGAP 11 8    Recent Labs  Lab 08/20/23 0243 08/20/23 0547  PROT 6.8 6.6  ALBUMIN  3.5 3.4*  AST 38 30  ALT 27 27  ALKPHOS 92 91  BILITOT 0.6 0.6    Hematology Recent Labs  Lab 08/20/23 0243 08/20/23 0547  WBC 7.9 6.0  RBC 4.71 4.61  HGB 13.0 12.6  HCT 40.0 39.1  MCV 84.9 84.8  MCH 27.6 27.3  MCHC 32.5 32.2  RDW 13.9 13.8  PLT 197 183    Radiology/Studies:  CT Angio Chest/Abd/Pel for Dissection W and/or Wo Contrast Result Date: 08/20/2023 CLINICAL DATA:  Acute aortic syndrome suspected.  Chest pain. EXAM: CT ANGIOGRAPHY CHEST, ABDOMEN AND PELVIS TECHNIQUE: Non-contrast CT of the chest was initially obtained. Multidetector CT imaging through the chest, abdomen and pelvis was performed using the standard protocol during bolus administration of intravenous contrast. Multiplanar reconstructed images and MIPs were obtained and reviewed to evaluate the vascular anatomy. RADIATION DOSE REDUCTION: This exam was performed according to the departmental dose-optimization program which includes automated exposure control, adjustment of the mA and/or kV according to Morgan [name] size and/or use of iterative reconstruction technique. CONTRAST:  OMNIPAQUE  IOHEXOL  350 MG/ML SOLN COMPARISON:  Chest radiograph 08/20/2023. CTA abdomen and pelvis 06/27/2023. CTA chest abdomen and pelvis 04/17/2023 FINDINGS: CTA CHEST FINDINGS Cardiovascular: Unenhanced images of the chest demonstrate minimal scattered aortic calcification. No evidence of acute intramural hematoma. Images obtained  during arterial phase after intravenous contrast material administration demonstrate normal caliber thoracic aorta. No aortic dissection. Great vessel origins are patent. Normal heart size. No pericardial effusions. Central pulmonary arteries are well opacified. No  evidence of significant pulmonary embolus. Mediastinum/Nodes: No enlarged mediastinal, hilar, or axillary lymph nodes. Thyroid  gland, trachea, and esophagus demonstrate no significant findings. Lungs/Pleura: Lungs are clear. No pleural effusion or pneumothorax. Musculoskeletal: Postoperative changes in the cervical spine. Degenerative changes in the thoracic spine. Normal alignment. No acute bony abnormalities. Review of the MIP images confirms the above findings. CTA ABDOMEN AND PELVIS FINDINGS VASCULAR Aorta: Normal caliber aorta without aneurysm, dissection, vasculitis or significant stenosis. Celiac: Patent without evidence of aneurysm, dissection, vasculitis or significant stenosis. SMA: Patent without evidence of aneurysm, dissection, vasculitis or significant stenosis. Renals: Both renal arteries are patent without evidence of aneurysm, dissection, vasculitis, fibromuscular dysplasia or significant stenosis. IMA: Patent without evidence of aneurysm, dissection, vasculitis or significant stenosis. Inflow: Patent without evidence of aneurysm, dissection, vasculitis or significant stenosis. Veins: No obvious venous abnormality within the limitations of this arterial phase study. Review of the MIP images confirms the above findings. NON-VASCULAR Hepatobiliary: Diffuse fatty infiltration of the liver. Focal lesion in segment 6 measuring 1.8 cm diameter with peripheral arterial flow. This is unchanged since previous studies. Previous characterization is hemangioma. No specific imaging follow-up is indicated. No bile duct dilatation. Gallbladder is normal. Pancreas: Unremarkable. No pancreatic ductal dilatation or surrounding inflammatory changes. Spleen:  Normal in size without focal abnormality. Adrenals/Urinary Tract: No adrenal gland nodules. Pelvic left kidney. Kidneys are otherwise unremarkable. Nephrograms are symmetrical. No hydronephrosis or hydroureter. Bladder is normal. Stomach/Bowel: Stomach, small bowel, and colon are not abnormally distended. No wall thickening or inflammatory stranding are identified. Appendix is normal. Lymphatic: No significant lymphadenopathy. Reproductive: Status post hysterectomy. No adnexal masses. Other: No abdominal wall hernia or abnormality. No abdominopelvic ascites. Musculoskeletal: Postoperative changes in the lower lumbar spine. Sclerosis in the L2 vertebral body is unchanged since prior study. Review of the MIP images confirms the above findings. IMPRESSION: 1. No evidence of aneurysm or dissection involving thoracic or abdominal aorta. 2. No evidence of significant pulmonary embolus. 3. No acute process demonstrated in the abdomen or pelvis. 4. Fatty infiltration of the liver. 5. Stable focal liver lesion consistent with hemangioma. 6. Pelvic left kidney.  No hydronephrosis. Electronically Signed   By: Elsie Gravely M.D.   On: 08/20/2023 03:33   DG Chest Portable 1 View Result Date: 08/20/2023 CLINICAL DATA:  Chest pain EXAM: PORTABLE CHEST 1 VIEW COMPARISON:  06/04/2023 FINDINGS: Heart size and pulmonary vascularity are normal. Lungs are clear. No pleural effusion or pneumothorax. Mediastinal contours appear intact. Postoperative changes in the cervical spine. IMPRESSION: No active disease. Electronically Signed   By: Elsie Gravely M.D.   On: 08/20/2023 02:55   Assessment and Plan:  Chest pain Morgan [name] presented to the ED overnight with ongoing left sided chest pain x2 days. Workup in the ED reassuring with no acute findings on chest CTA, negative troponin x2, non-ischemic ECG. Pain is now significantly improved, Morgan [name] attributing this to Toradol . Of note, Morgan [name] had cardiac CTA as a part of pre-ablation  workup in March 2025. This showed CAC score 0 (though study completed without NTG).  Though Morgan [name] does have risk factors for CAD, no clear cardiac cause of chest and arm discomfort at this time by labs and ECG. Given risk factors and exertional nature of symptoms, warrants coronary CTA. Will attempt to facilitate.  Can defer echo to outpatient setting.  Hypertension Morgan [name] with typically well managed BP per recent clinic vital signs. However, was hypertensive overnight in the ED, up to 172/54mmHg. Hypertension could have been pain/stress induced, though also  could be a cause of Morgan [name]'s discomfort if Morgan [name] has been hypertensive at home. BP now back to normal range. Continue to monitor.   Paroxysmal atrial fibrillation s/p ablation Palpitations Morgan [name] s/p March 2025 afib ablation with Dr. Nancey has continued with palpitations and episodes of rapid HR. Following her ablation, was started on Flecainide  50mg  BID, continued on Bystolic  2.5mg  BID. Currently wearing a heart monitor to further evaluate rhythm. In the ED today, NSR on telemetry and ECG.  Continue Flecainide  50mg  BID Continue Bystolic  2.5mg  BID Continue Eliquis  5mg  BID Continue with plans for cardiac monitor (not yet started)   Risk Assessment/Risk Scores:        For questions or updates, please contact [facility] Please consult [email or web address] for contact info under    Signed, Artist Pouch, PA-C  08/20/2023 8:22 AM    Attending note:  Morgan [name] seen and examined.  I reviewed her records and discussed the case with Mr. Pouch RIGGERS, I agree with his above findings.  Ms. Dupree presents for evaluation of recent onset chest discomfort.  Morgan [name] has a history of atrial fibrillation status post ablation with Dr. Nancey in March.  No prior history of documented ischemic heart disease.  Preablation cardiac CT demonstrated a coronary calcium  score of 0.  Morgan [name] reports a family history of heart disease in her father, also personal  history of type 2 diabetes mellitus, hyperlipidemia, and hypertension.  Morgan [name] tells me that Morgan [name] was shopping at [facility] yesterday and began to feel a pressure sensation in her chest.  Morgan [name] stopped and went home to rest, symptoms resolved although have recurred intermittently since then, including waking her up last night and radiating to her left arm.  ECG is normal as are cardiac enzymes.  Morgan [name] has no symptoms now having been treated with morphine  and Toradol .  Morgan [name] did undergo a chest CTA showing no evidence of aortic pathology or pulmonary embolus.  On examination Morgan [name] appears comfortable.  Morgan [name] is afebrile, heart rate in the 60s to 70s in sinus rhythm, blood pressure 119/62.  Lungs are clear.  Cardiac exam with RRR and no gallop or murmur.  Pertinent lab work includes potassium 4.0, creatinine 0.68, GFR greater than 60, normal high-sensitivity troponin I levels, hemoglobin 12.6, platelets 183.  Recent hemoglobin A1c 8.3%.  Morgan [name] presents with symptoms concerning for unstable angina, however reassuring evaluation so far including normal ECG, normal cardiac enzymes, and history of a calcium  score of 0 as of March.  Morgan [name] does have family history of heart disease and other personal risk factors as discussed above.  Would still have concern for potential soft coronary plaque leading to symptoms (however not calcified) and therefore further ischemic workup is warranted.  We will plan to pursue a coronary CTA today.  Give dose of beta-blocker this morning.  Further recommendations to follow.  Morgan [name], M.D., F.A.C.C.

## 2023-08-20 NOTE — ED Provider Notes (Signed)
 [facility] AT [facility] Provider Note   CSN: [id number] Arrival date & time: 08/20/23  0225     Patient presents with: Chest Pain   Morgan [name] is a 58 y.o. female.    h/o AF, AFL, HTN, HLD, GIB, ischemic colitis, DM II, bipolar disorder, chronic pain syndrome Presents via EMS with 2 days of intermittent left-sided chest pain that radiates to her arm and upper back.  Pain is constant, feels like pressure, sharp and stabbing.  Worse with exertion.  Nothing makes it better.  Waxes and wanes in severity.  Some shortness of breath.  No nausea, vomiting or diaphoresis.  No abdominal pain.  No fever.  Does have a history of atrial fibrillation takes Eliquis .  Denies any history of CAD or stents.  She has not had this pain in the past.  The history is provided by the patient and the EMS personnel. The history is limited by the condition of the patient.  Chest Pain Associated symptoms: back pain and shortness of breath   Associated symptoms: no abdominal pain, no dizziness, no headache, no nausea, no vomiting and no weakness        Prior to Admission medications   Medication Sig Start Date End Date Taking? Authorizing Provider  acetaminophen  (TYLENOL ) 500 MG tablet Take 1,000 mg by mouth every 6 (six) hours as needed for mild pain (pain score 1-3).    [provider]  apixaban  (ELIQUIS ) 5 MG TABS tablet TAKE 1 TABLET BY MOUTH TWICE A DAY 11/01/22   Dann Candyce RAMAN, MD  bismuth  subsalicylate (PEPTO BISMOL) 262 MG chewable tablet Chew 524 mg by mouth as needed for diarrhea or loose stools or indigestion.    [provider]  DULoxetine  (CYMBALTA ) 60 MG capsule Take 60 mg by mouth at bedtime. 11/04/16   [provider]  flecainide  (TAMBOCOR ) 50 MG tablet Take 1 tablet (50 mg total) by mouth 2 (two) times daily. 06/13/23   Fenton, Clint R, PA  hydrocortisone  (ANUSOL -HC) 25 MG suppository Place 1 suppository (25 mg total) rectally 2  (two) times daily. 06/28/23   Barbarann Nest, MD  insulin  glargine (LANTUS ) 100 UNIT/ML Solostar Pen Inject 10 Units into the skin daily. Titrate up to goal of fasting glucose level between 100-140. 07/04/23   Chandra Toribio POUR, MD  loxapine  (LOXITANE ) 10 MG capsule Take 10 mg by mouth 2 (two) times daily. 06/08/23   [provider]  nebivolol  (BYSTOLIC ) 2.5 MG tablet Take 1 tablet (2.5 mg total) by mouth at bedtime. 10/26/22   Swinyer, Rosaline HERO, NP  pantoprazole  (PROTONIX ) 40 MG tablet Take 1 tablet (40 mg total) by mouth 2 (two) times daily. 06/28/23   Barbarann Nest, MD  rosuvastatin  (CRESTOR ) 10 MG tablet TAKE 1 TABLET (10 MG TOTAL) BY MOUTH DAILY. 07/15/23   Mealor, Augustus E, MD  tiZANidine  (ZANAFLEX ) 4 MG tablet Take 4 mg by mouth at bedtime as needed for muscle spasms. 05/14/21   [provider]  XIGDUO  XR 11-998 MG TB24 TAKE 1 TABLET BY MOUTH EVERYDAY AT BEDTIME Patient taking differently: Take 1 tablet by mouth at bedtime. 04/07/23   Wallace Joesph LABOR, PA    Allergies: Clindamycin/lincomycin, Lamictal [lamotrigine], Banana, Lipitor [atorvastatin ], and Pineapple    Review of Systems  Constitutional:  Negative for activity change and appetite change.  HENT:  Negative for congestion and rhinorrhea.   Respiratory:  Positive for chest tightness and shortness of breath.   Cardiovascular:  Positive for  chest pain.  Gastrointestinal:  Negative for abdominal pain, nausea and vomiting.  Genitourinary:  Negative for dysuria and hematuria.  Musculoskeletal:  Positive for back pain. Negative for arthralgias and myalgias.  Skin:  Negative for rash.  Neurological:  Negative for dizziness, weakness and headaches.    all other systems are negative except as noted in the HPI and PMH.   Updated Vital Signs Ht 5' 1 (1.549 m)   Wt 81.6 kg   LMP 05/12/2011   BMI 34.01 kg/m   Physical Exam Vitals and nursing note reviewed.  Constitutional:      General: She is not in acute  distress.    Appearance: She is well-developed.     Comments: uncomfortable  HENT:     Head: Normocephalic and atraumatic.     Mouth/Throat:     Pharynx: No oropharyngeal exudate.  Eyes:     Conjunctiva/sclera: Conjunctivae normal.     Pupils: Pupils are equal, round, and reactive to light.  Neck:     Comments: No meningismus. Cardiovascular:     Rate and Rhythm: Normal rate and regular rhythm.     Heart sounds: Normal heart sounds. No murmur heard. Pulmonary:     Effort: Pulmonary effort is normal. No respiratory distress.     Breath sounds: Normal breath sounds.     Comments: Pain not reproducible.  Not worse with arm movement. Chest:     Chest wall: No tenderness.  Abdominal:     Palpations: Abdomen is soft.     Tenderness: There is no abdominal tenderness. There is no guarding or rebound.  Musculoskeletal:        General: No tenderness. Normal range of motion.     Cervical back: Normal range of motion and neck supple.  Skin:    General: Skin is warm.  Neurological:     Mental Status: She is alert and oriented to person, place, and time.     Cranial Nerves: No cranial nerve deficit.     Motor: No abnormal muscle tone.     Coordination: Coordination normal.     Comments:  5/5 strength throughout. CN 2-12 intact.Equal grip strength.   Psychiatric:        Behavior: Behavior normal.     (all labs ordered are listed, but only abnormal results are displayed) Labs Reviewed  COMPREHENSIVE METABOLIC PANEL WITH GFR - Abnormal; Notable for the following components:      Result Value   Glucose, Bld 214 (*)    All other components within normal limits  URINALYSIS, ROUTINE W REFLEX MICROSCOPIC - Abnormal; Notable for the following components:   Color, Urine STRAW (*)    Specific Gravity, Urine >1.046 (*)    Glucose, UA >=500 (*)    All other components within normal limits  CBG MONITORING, ED - Abnormal; Notable for the following components:   Glucose-Capillary 147 (*)     All other components within normal limits  CBC WITH DIFFERENTIAL/PLATELET  LIPASE, BLOOD  COMPREHENSIVE METABOLIC PANEL WITH GFR  CBC WITH DIFFERENTIAL/PLATELET  TROPONIN I (HIGH SENSITIVITY)  TROPONIN I (HIGH SENSITIVITY)    EKG: None  Radiology: CT Angio Chest/Abd/Pel for Dissection W and/or Wo Contrast Result Date: 08/20/2023 CLINICAL DATA:  Acute aortic syndrome suspected.  Chest pain. EXAM: CT ANGIOGRAPHY CHEST, ABDOMEN AND PELVIS TECHNIQUE: Non-contrast CT of the chest was initially obtained. Multidetector CT imaging through the chest, abdomen and pelvis was performed using the standard protocol during bolus administration of intravenous contrast. Multiplanar reconstructed images  and MIPs were obtained and reviewed to evaluate the vascular anatomy. RADIATION DOSE REDUCTION: This exam was performed according to the departmental dose-optimization program which includes automated exposure control, adjustment of the mA and/or kV according to patient size and/or use of iterative reconstruction technique. CONTRAST:  OMNIPAQUE  IOHEXOL  350 MG/ML SOLN COMPARISON:  Chest radiograph 08/20/2023. CTA abdomen and pelvis 06/27/2023. CTA chest abdomen and pelvis 04/17/2023 FINDINGS: CTA CHEST FINDINGS Cardiovascular: Unenhanced images of the chest demonstrate minimal scattered aortic calcification. No evidence of acute intramural hematoma. Images obtained during arterial phase after intravenous contrast material administration demonstrate normal caliber thoracic aorta. No aortic dissection. Great vessel origins are patent. Normal heart size. No pericardial effusions. Central pulmonary arteries are well opacified. No evidence of significant pulmonary embolus. Mediastinum/Nodes: No enlarged mediastinal, hilar, or axillary lymph nodes. Thyroid  gland, trachea, and esophagus demonstrate no significant findings. Lungs/Pleura: Lungs are clear. No pleural effusion or pneumothorax. Musculoskeletal: Postoperative  changes in the cervical spine. Degenerative changes in the thoracic spine. Normal alignment. No acute bony abnormalities. Review of the MIP images confirms the above findings. CTA ABDOMEN AND PELVIS FINDINGS VASCULAR Aorta: Normal caliber aorta without aneurysm, dissection, vasculitis or significant stenosis. Celiac: Patent without evidence of aneurysm, dissection, vasculitis or significant stenosis. SMA: Patent without evidence of aneurysm, dissection, vasculitis or significant stenosis. Renals: Both renal arteries are patent without evidence of aneurysm, dissection, vasculitis, fibromuscular dysplasia or significant stenosis. IMA: Patent without evidence of aneurysm, dissection, vasculitis or significant stenosis. Inflow: Patent without evidence of aneurysm, dissection, vasculitis or significant stenosis. Veins: No obvious venous abnormality within the limitations of this arterial phase study. Review of the MIP images confirms the above findings. NON-VASCULAR Hepatobiliary: Diffuse fatty infiltration of the liver. Focal lesion in segment 6 measuring 1.8 cm diameter with peripheral arterial flow. This is unchanged since previous studies. Previous characterization is hemangioma. No specific imaging follow-up is indicated. No bile duct dilatation. Gallbladder is normal. Pancreas: Unremarkable. No pancreatic ductal dilatation or surrounding inflammatory changes. Spleen: Normal in size without focal abnormality. Adrenals/Urinary Tract: No adrenal gland nodules. Pelvic left kidney. Kidneys are otherwise unremarkable. Nephrograms are symmetrical. No hydronephrosis or hydroureter. Bladder is normal. Stomach/Bowel: Stomach, small bowel, and colon are not abnormally distended. No wall thickening or inflammatory stranding are identified. Appendix is normal. Lymphatic: No significant lymphadenopathy. Reproductive: Status post hysterectomy. No adnexal masses. Other: No abdominal wall hernia or abnormality. No abdominopelvic  ascites. Musculoskeletal: Postoperative changes in the lower lumbar spine. Sclerosis in the L2 vertebral body is unchanged since prior study. Review of the MIP images confirms the above findings. IMPRESSION: 1. No evidence of aneurysm or dissection involving thoracic or abdominal aorta. 2. No evidence of significant pulmonary embolus. 3. No acute process demonstrated in the abdomen or pelvis. 4. Fatty infiltration of the liver. 5. Stable focal liver lesion consistent with hemangioma. 6. Pelvic left kidney.  No hydronephrosis. Electronically Signed   By: Elsie Gravely M.D.   On: 08/20/2023 03:33   DG Chest Portable 1 View Result Date: 08/20/2023 CLINICAL DATA:  Chest pain EXAM: PORTABLE CHEST 1 VIEW COMPARISON:  06/04/2023 FINDINGS: Heart size and pulmonary vascularity are normal. Lungs are clear. No pleural effusion or pneumothorax. Mediastinal contours appear intact. Postoperative changes in the cervical spine. IMPRESSION: No active disease. Electronically Signed   By: Elsie Gravely M.D.   On: 08/20/2023 02:55     Procedures   Medications Ordered in the ED  morphine  (PF) 4 MG/ML injection 4 mg (has no administration in time range)  nitroGLYCERIN  (NITROSTAT ) SL tablet 0.4 mg (has no administration in time range)                                    Medical Decision Making Amount and/or Complexity of Data Reviewed Independent Historian: EMS Labs: ordered. Decision-making details documented in ED Course. Radiology: ordered and independent interpretation performed. Decision-making details documented in ED Course. ECG/medicine tests: ordered.  Risk OTC drugs. Prescription drug management. Decision regarding hospitalization.  2 days of intermittent left-sided chest pain that radiates to upper back.  EKG is sinus rhythm without acute ST elevation.  Patient appears very uncomfortable.  Will give morphine  and nitroglycerin .  Will evaluate for aortic dissection  Aspirin , morphine  and  nitroglycerin  given.  EKG is sinus rhythm without acute ST changes. Troponin negative.  CTA obtained given her degree of discomfort.  This is negative for pulmonary embolism or aortic dissection.  Chart review shows she did have cardiac CTA in March 2025 that showed calcium  score of 0.  Troponin remains negative.  With recent CTA calcium  score of 0, chest pain appears to be noncardiac in nature.  Will give GI cocktail and Toradol .  Troponin negative x 2.  Chest pain has improved with Toradol . Workup remains reassuring.  Did have recent CT calcium  score of 0 which is reassuring but still has ongoing pain that is not reproducible and not responding to the above treatments.  Does not appear to be consistent with ACS.  CTA negative for aortic aneurysm or dissection.  With ongoing pain we will observe in the hospital for further evaluation.  Discussed with Dr. Franky.    Final diagnoses:  Nonspecific chest pain    ED Discharge Orders     None          Zahra Peffley, Garnette, MD 08/20/23 [phone]

## 2023-08-20 NOTE — ED Notes (Signed)
Patient left the floor in stable condition, AOX4, with her belongings and staff.  

## 2023-08-20 NOTE — ED Notes (Signed)
 Missed IV attempt, advised Trudy, Morgan [name], will place order for IV team.

## 2023-08-20 NOTE — H&P (Signed)
 History and Physical    JOYIA RIEHLE FMW:[id number] DOB: [birth year] DOA: 08/20/2023  Patient coming from: Home.  Chief Complaint: Chest pain.  HPI: Morgan [name] is a 58 y.o. female with history of paroxysmal atrial fibrillation status post ablation in March 2025, hypertension, diabetes mellitus type 2, bipolar disorder, hyperlipidemia presents to the ER with complaint of chest pain.  Patient states last 2 days patient has been experiencing left-sided chest pain.  Pain is present even at rest.  Denies any associated shortness of breath.  Last night while sleeping pain became acutely worse and patient decided to come to the ER.  Pain is pressure-like nonradiating.  Coronary CT done in March 2025 showed 0 calcium  score.  ED Course: In the ER CT angiogram of the chest was negative for PE.  EKG shows normal sinus rhythm.  Labs show troponin of 3-second one is pending.  Blood glucose 214.  Hemoglobin 13.  Patient admitted for further observation.  Review of Systems: As per HPI, rest all negative.   Past Medical History:  Diagnosis Date   Anxiety    Atrial fibrillation (HCC)    Bipolar disorder (HCC)    Breast mass    right, removed and benign   Cancer (HCC) 09/16/2019   ovarian ca no radiation no chemo   Cervical dysplasia    Diabetes mellitus without complication (HCC)    Endometriosis    GERD (gastroesophageal reflux disease)    Hypercholesteremia    Hypertension    Migraine    Ovarian cancer (HCC)    Pelvic kidney    left   Postpartum depression    hx of    Past Surgical History:  Procedure Laterality Date   ATRIAL FIBRILLATION ABLATION N/A 05/16/2023   Procedure: ATRIAL FIBRILLATION ABLATION;  Surgeon: Nancey Eulas BRAVO, MD;  Location: [facility];  Service: Cardiovascular;  Laterality: N/A;   BACK SURGERY  02/16/2012   BREAST BIOPSY Right 2016   BREAST BIOPSY Right 2020   BREAST EXCISIONAL BIOPSY Right 2021   BREAST EXCISIONAL BIOPSY Right 2016   benign    BREAST LUMPECTOMY WITH RADIOACTIVE SEED LOCALIZATION Right 10/28/2014   Procedure: RIGHT BREAST LUMPECTOMY WITH RADIOACTIVE SEED LOCALIZATION;  Surgeon: Deward Null III, MD;  Location: [facility];  Service: General;  Laterality: Right;   BREAST LUMPECTOMY WITH RADIOACTIVE SEED LOCALIZATION Right 06/18/2019   Procedure: RIGHT BREAST RADIOACTIVE SEED LOCALIZATION LUMPECTOMY;  Surgeon: Null Deward MOULD, MD;  Location: [facility];  Service: General;  Laterality: Right;   BREAST SURGERY N/A    Phreesia 07/30/2019   CESAREAN SECTION  02/15/2001   COLONOSCOPY WITH PROPOFOL  N/A 05/01/2018   Procedure: COLONOSCOPY WITH PROPOFOL ;  Surgeon: Therisa Bi, MD;  Location: [facility];  Service: Gastroenterology;  Laterality: N/A;   COLPOSCOPY     ESOPHAGOGASTRODUODENOSCOPY (EGD) WITH PROPOFOL  N/A 05/01/2018   Procedure: ESOPHAGOGASTRODUODENOSCOPY (EGD) WITH PROPOFOL ;  Surgeon: Therisa Bi, MD;  Location: [facility];  Service: Gastroenterology;  Laterality: N/A;   ESOPHAGOGASTRODUODENOSCOPY (EGD) WITH PROPOFOL  N/A 11/13/2018   Procedure: ESOPHAGOGASTRODUODENOSCOPY (EGD) WITH PROPOFOL ;  Surgeon: Therisa Bi, MD;  Location: [facility] END[facility]COPY;  Service: Gastroenterology;  Laterality: N/A;   GYNECOLOGIC CRYOSURGERY     NECK SURGERY  02/15/2010   PELVIC LAPAROSCOPY  02/15/1989   TOTAL ABDOMINAL HYSTERECTOMY N/A    Phreesia 07/30/2019     reports that she has never smoked. She has never been exposed to tobacco smoke. She has never used smokeless tobacco. She reports that  she does not drink alcohol and does not use drugs.  Allergies  Allergen Reactions   Clindamycin/Lincomycin Rash    Diffuse drug reaction eruption   Lamictal [Lamotrigine] Rash   Banana Diarrhea   Lipitor [Atorvastatin ] Other (See Comments)    Severe fatigue   Pineapple Diarrhea    Family History  Problem Relation Age of Onset   Diabetes Mother    Breast cancer Mother 47   Thyroid  disease Mother     Depression Mother    Hypertension Father    Heart disease Father    Heart attack Father 81       Two instances, first at age 61   Stroke Father    Alcohol abuse Father    Throat cancer Father    Diabetes Maternal Grandmother    Depression Maternal Grandmother    Uterine cancer Maternal Grandmother     Prior to Admission medications   Medication Sig Start Date End Date Taking? Authorizing Provider  acetaminophen  (TYLENOL ) 500 MG tablet Take 1,000 mg by mouth every 6 (six) hours as needed for mild pain (pain score 1-3).    [provider]  apixaban  (ELIQUIS ) 5 MG TABS tablet TAKE 1 TABLET BY MOUTH TWICE A DAY 11/01/22   Dann Candyce RAMAN, MD  bismuth  subsalicylate (PEPTO BISMOL) 262 MG chewable tablet Chew 524 mg by mouth as needed for diarrhea or loose stools or indigestion.    [provider]  DULoxetine  (CYMBALTA ) 60 MG capsule Take 60 mg by mouth at bedtime. 11/04/16   [provider]  flecainide  (TAMBOCOR ) 50 MG tablet Take 1 tablet (50 mg total) by mouth 2 (two) times daily. 06/13/23   Fenton, Clint R, PA  hydrocortisone  (ANUSOL -HC) 25 MG suppository Place 1 suppository (25 mg total) rectally 2 (two) times daily. 06/28/23   Barbarann Nest, MD  insulin  glargine (LANTUS ) 100 UNIT/ML Solostar Pen Inject 10 Units into the skin daily. Titrate up to goal of fasting glucose level between 100-140. 07/04/23   Chandra Toribio POUR, MD  loxapine  (LOXITANE ) 10 MG capsule Take 10 mg by mouth 2 (two) times daily. 06/08/23   [provider]  nebivolol  (BYSTOLIC ) 2.5 MG tablet Take 1 tablet (2.5 mg total) by mouth at bedtime. 10/26/22   Swinyer, Rosaline HERO, NP  pantoprazole  (PROTONIX ) 40 MG tablet Take 1 tablet (40 mg total) by mouth 2 (two) times daily. 06/28/23   Barbarann Nest, MD  rosuvastatin  (CRESTOR ) 10 MG tablet TAKE 1 TABLET (10 MG TOTAL) BY MOUTH DAILY. 07/15/23   Mealor, Augustus E, MD  tiZANidine  (ZANAFLEX ) 4 MG tablet Take 4 mg by mouth at bedtime as needed for  muscle spasms. 05/14/21   [provider]  XIGDUO  XR 11-998 MG TB24 TAKE 1 TABLET BY MOUTH EVERYDAY AT BEDTIME Patient taking differently: Take 1 tablet by mouth at bedtime. 04/07/23   Wallace Joesph LABOR, PA    Physical Exam: Constitutional: Moderately built and nourished. Vitals:   08/20/23 0235 08/20/23 0358 08/20/23 0430 08/20/23 0500  BP: (!) 146/74 (!) 172/82 132/64 (!) 170/87  Pulse: 65 68 71 78  Resp: 11 16 12 17   Temp: 98.8 F (37.1 C)     SpO2: 98% 98% 95% 95%  Weight:      Height:       Eyes: Anicteric no pallor. ENMT: No discharge from the ears eyes nose and mouth. Neck: No mass felt.  No neck rigidity. Respiratory: No rhonchi or crepitations. Cardiovascular: S1-S2 heard. Abdomen: Soft nontender bowel sound present.  Musculoskeletal: No edema.  Pain in the left chest hurts when she abducts her left arm. Skin: No rash. Neurologic: Alert awake oriented to time place and person.  Moves all extremities. Psychiatric: Appears normal.  Normal affect.   Labs on Admission: I have personally reviewed following labs and imaging studies  CBC: Recent Labs  Lab 08/20/23 0243  WBC 7.9  NEUTROABS 4.8  HGB 13.0  HCT 40.0  MCV 84.9  PLT 197   Basic Metabolic Panel: Recent Labs  Lab 08/20/23 0243  NA 136  K 3.7  CL 102  CO2 23  GLUCOSE 214*  BUN 7  CREATININE 0.68  CALCIUM  9.4   GFR: Estimated Creatinine Clearance: 76.9 mL/min (by C-G formula based on SCr of 0.68 mg/dL). Liver Function Tests: Recent Labs  Lab 08/20/23 0243  AST 38  ALT 27  ALKPHOS 92  BILITOT 0.6  PROT 6.8  ALBUMIN  3.5   Recent Labs  Lab 08/20/23 0243  LIPASE 27   No results for input(s): AMMONIA in the last 168 hours. Coagulation Profile: No results for input(s): INR, PROTIME in the last 168 hours. Cardiac Enzymes: No results for input(s): CKTOTAL, CKMB, CKMBINDEX, TROPONINI in the last 168 hours. BNP (last 3 results) No results for input(s): PROBNP in the  last 8760 hours. HbA1C: No results for input(s): HGBA1C in the last 72 hours. CBG: No results for input(s): GLUCAP in the last 168 hours. Lipid Profile: No results for input(s): CHOL, HDL, LDLCALC, TRIG, CHOLHDL, LDLDIRECT in the last 72 hours. Thyroid  Function Tests: No results for input(s): TSH, T4TOTAL, FREET4, T3FREE, THYROIDAB in the last 72 hours. Anemia Panel: No results for input(s): VITAMINB12, FOLATE, FERRITIN, TIBC, IRON, RETICCTPCT in the last 72 hours. Urine analysis:    Component Value Date/Time   COLORURINE STRAW (A) 08/20/2023 0352   APPEARANCEUR CLEAR 08/20/2023 0352   APPEARANCEUR Hazy 05/31/2014 1356   LABSPEC >1.046 (H) 08/20/2023 0352   LABSPEC 1.011 05/31/2014 1356   PHURINE 5.0 08/20/2023 0352   GLUCOSEU >=500 (A) 08/20/2023 0352   GLUCOSEU Negative 05/31/2014 1356   HGBUR NEGATIVE 08/20/2023 0352   BILIRUBINUR NEGATIVE 08/20/2023 0352   BILIRUBINUR negative 12/13/2022 1135   BILIRUBINUR Negative 06/23/2022 0949   BILIRUBINUR Negative 05/31/2014 1356   KETONESUR NEGATIVE 08/20/2023 0352   PROTEINUR NEGATIVE 08/20/2023 0352   UROBILINOGEN 0.2 12/13/2022 1135   UROBILINOGEN 0.2 12/01/2012 1539   NITRITE NEGATIVE 08/20/2023 0352   LEUKOCYTESUR NEGATIVE 08/20/2023 0352   LEUKOCYTESUR Trace 05/31/2014 1356   Sepsis Labs: @LABRCNTIP (procalcitonin:4,lacticidven:4) )No results found for this or any previous visit (from the past 240 hours).   Radiological Exams on Admission: CT Angio Chest/Abd/Pel for Dissection W and/or Wo Contrast Result Date: 08/20/2023 CLINICAL DATA:  Acute aortic syndrome suspected.  Chest pain. EXAM: CT ANGIOGRAPHY CHEST, ABDOMEN AND PELVIS TECHNIQUE: Non-contrast CT of the chest was initially obtained. Multidetector CT imaging through the chest, abdomen and pelvis was performed using the standard protocol during bolus administration of intravenous contrast. Multiplanar reconstructed images and MIPs  were obtained and reviewed to evaluate the vascular anatomy. RADIATION DOSE REDUCTION: This exam was performed according to the departmental dose-optimization program which includes automated exposure control, adjustment of the mA and/or kV according to patient size and/or use of iterative reconstruction technique. CONTRAST:  OMNIPAQUE  IOHEXOL  350 MG/ML SOLN COMPARISON:  Chest radiograph 08/20/2023. CTA abdomen and pelvis 06/27/2023. CTA chest abdomen and pelvis 04/17/2023 FINDINGS: CTA CHEST FINDINGS Cardiovascular: Unenhanced images of the chest demonstrate minimal scattered aortic calcification. No  evidence of acute intramural hematoma. Images obtained during arterial phase after intravenous contrast material administration demonstrate normal caliber thoracic aorta. No aortic dissection. Great vessel origins are patent. Normal heart size. No pericardial effusions. Central pulmonary arteries are well opacified. No evidence of significant pulmonary embolus. Mediastinum/Nodes: No enlarged mediastinal, hilar, or axillary lymph nodes. Thyroid  gland, trachea, and esophagus demonstrate no significant findings. Lungs/Pleura: Lungs are clear. No pleural effusion or pneumothorax. Musculoskeletal: Postoperative changes in the cervical spine. Degenerative changes in the thoracic spine. Normal alignment. No acute bony abnormalities. Review of the MIP images confirms the above findings. CTA ABDOMEN AND PELVIS FINDINGS VASCULAR Aorta: Normal caliber aorta without aneurysm, dissection, vasculitis or significant stenosis. Celiac: Patent without evidence of aneurysm, dissection, vasculitis or significant stenosis. SMA: Patent without evidence of aneurysm, dissection, vasculitis or significant stenosis. Renals: Both renal arteries are patent without evidence of aneurysm, dissection, vasculitis, fibromuscular dysplasia or significant stenosis. IMA: Patent without evidence of aneurysm, dissection, vasculitis or significant  stenosis. Inflow: Patent without evidence of aneurysm, dissection, vasculitis or significant stenosis. Veins: No obvious venous abnormality within the limitations of this arterial phase study. Review of the MIP images confirms the above findings. NON-VASCULAR Hepatobiliary: Diffuse fatty infiltration of the liver. Focal lesion in segment 6 measuring 1.8 cm diameter with peripheral arterial flow. This is unchanged since previous studies. Previous characterization is hemangioma. No specific imaging follow-up is indicated. No bile duct dilatation. Gallbladder is normal. Pancreas: Unremarkable. No pancreatic ductal dilatation or surrounding inflammatory changes. Spleen: Normal in size without focal abnormality. Adrenals/Urinary Tract: No adrenal gland nodules. Pelvic left kidney. Kidneys are otherwise unremarkable. Nephrograms are symmetrical. No hydronephrosis or hydroureter. Bladder is normal. Stomach/Bowel: Stomach, small bowel, and colon are not abnormally distended. No wall thickening or inflammatory stranding are identified. Appendix is normal. Lymphatic: No significant lymphadenopathy. Reproductive: Status post hysterectomy. No adnexal masses. Other: No abdominal wall hernia or abnormality. No abdominopelvic ascites. Musculoskeletal: Postoperative changes in the lower lumbar spine. Sclerosis in the L2 vertebral body is unchanged since prior study. Review of the MIP images confirms the above findings. IMPRESSION: 1. No evidence of aneurysm or dissection involving thoracic or abdominal aorta. 2. No evidence of significant pulmonary embolus. 3. No acute process demonstrated in the abdomen or pelvis. 4. Fatty infiltration of the liver. 5. Stable focal liver lesion consistent with hemangioma. 6. Pelvic left kidney.  No hydronephrosis. Electronically Signed   By: Elsie Gravely M.D.   On: 08/20/2023 03:33   DG Chest Portable 1 View Result Date: 08/20/2023 CLINICAL DATA:  Chest pain EXAM: PORTABLE CHEST 1 VIEW  COMPARISON:  06/04/2023 FINDINGS: Heart size and pulmonary vascularity are normal. Lungs are clear. No pleural effusion or pneumothorax. Mediastinal contours appear intact. Postoperative changes in the cervical spine. IMPRESSION: No active disease. Electronically Signed   By: Elsie Gravely M.D.   On: 08/20/2023 02:55    EKG: Independently reviewed.  Normal sinus rhythm.  Assessment/Plan Principal Problem:   Chest pain Active Problems:   Bipolar disorder (HCC)   Type 2 diabetes mellitus with other specified complication (HCC)   Hyperlipidemia associated with type 2 diabetes mellitus (HCC)   Paroxysmal atrial fibrillation (HCC)   Essential hypertension    Chest pain -     some of the chest pain is reproducible on moving the left arm.  However given the risk factors including diabetes and high blood pressure and hyperlipidemia we will consult cardiology.  Keep patient n.p.o. except medications.  Check 2D echo.  The second troponin is  pending.  Patient's calcium  scoring in March 2025 was 0. Paroxysmal atrial fibrillation status post ablation in March 2025 presently in sinus rhythm takes Eliquis  flecainide  and beta-blockers. Diabetes mellitus type 2 takes Lantus  10 units.  While being on n.p.o. will keep patient on 5 units Lantus  with sliding scale coverage.  Last hemoglobin A1c was 8.3 a month ago.  Patient also takes Xigduo . Hypertension on beta-blockers. Bipolar disorder on Loxitane  and Cymbalta . Hyperlipidemia on statins. GERD on PPI.  Since patient has chest pain will need close monitoring further workup and more than 2 midnight stay.   DVT prophylaxis: Eliquis . Code Status: Full code. Family Communication: Discussed with patient. Disposition Plan: Monitored bed. Consults called: Cardiology. Admission status: The patient.

## 2023-08-20 NOTE — ED Triage Notes (Signed)
 Patient arrives via EMS from home for left sided chest pain ongoing for 2 days but worsened tonight. Patient describes the pain as sharp and radiates to left arm. Endorses nausea.

## 2023-08-20 NOTE — ED Notes (Signed)
 IV placed, [place], [place] and CT notified.

## 2023-08-22 ENCOUNTER — Telehealth: Payer: Self-pay

## 2023-08-22 NOTE — Transitions of Care (Post Inpatient/ED Visit) (Signed)
 08/22/2023  Name: Morgan [name] MRN: [id number] DOB: 06/02/1965  Today's TOC FU Call Status: Today's TOC FU Call Status:: Successful TOC FU Call Completed TOC FU Call Complete Date: 08/22/23 Patient's Name and Date of Birth confirmed.  Transition Care Management Follow-up Telephone Call Date of Discharge: 08/20/23 Discharge Facility: [facility]) Type of Discharge: Inpatient Admission How have you been since you were released from the hospital?: Better Any questions or concerns?: No  Items Reviewed: Did you receive and understand the discharge instructions provided?: Yes Medications obtained,verified, and reconciled?: Yes (Medications Reviewed) Any new allergies since your discharge?: No Dietary orders reviewed?: Yes Do you have support at home?: Yes People in Home [RPT]: child(ren), adult, spouse  Medications Reviewed Today: Medications Reviewed Today     Reviewed by Emmitt Pan, LPN (Licensed Practical Nurse) on 08/22/23 at [phone]  Med List Status: <None>   Medication Order Taking? Sig Documenting Provider Last Dose Status Informant  acetaminophen  (TYLENOL ) 500 MG tablet [id number] Yes Take 1,000 mg by mouth every 6 (six) hours as needed for mild pain (pain score 1-3). [provider]  Active Self, Pharmacy Records, Multiple Informants  apixaban  (ELIQUIS ) 5 MG TABS tablet [id number] Yes TAKE 1 TABLET BY MOUTH TWICE A DAY Dann Candyce RAMAN, MD  Active Self, Pharmacy Records, Multiple Informants           Med Note STEFFI, ADELITA   Tue Jun 28, 2023  3:10 PM)    DULoxetine  (CYMBALTA ) 60 MG capsule [id number] Yes Take 120 mg by mouth at bedtime. [provider]  Active Self, Pharmacy Records, Multiple Informants  flecainide  (TAMBOCOR ) 50 MG tablet [id number] Yes Take 1 tablet (50 mg total) by mouth 2 (two) times daily. Fenton, Clint R, PA  Active Self, Pharmacy Records, Multiple Informants  insulin  glargine (LANTUS ) 100 UNIT/ML Solostar Pen [id number]  Yes Inject 10 Units into the skin daily. Titrate up to goal of fasting glucose level between 100-140.  Patient taking differently: Inject 30 Units into the skin daily.   Chandra Toribio POUR, MD  Active Self, Pharmacy Records, Multiple Informants  Lurasidone  HCl 60 MG TABS [id number] Yes Take 60 mg by mouth daily at 12 noon. [provider]  Active Self, Pharmacy Records, Multiple Informants  nebivolol  (BYSTOLIC ) 2.5 MG tablet [id number] Yes Take 1 tablet (2.5 mg total) by mouth at bedtime. Swinyer, Rosaline HERO, NP  Active Self, Pharmacy Records, Multiple Informants  omeprazole  (PRILOSEC ) 20 MG capsule [id number] Yes Take 20 mg by mouth daily as needed (indigestion / heart burn). [provider]  Active Self, Pharmacy Records, Multiple Informants  rosuvastatin  (CRESTOR ) 10 MG tablet [id number] Yes TAKE 1 TABLET (10 MG TOTAL) BY MOUTH DAILY. Mealor, Augustus E, MD  Active Self, Pharmacy Records, Multiple Informants  tiZANidine  (ZANAFLEX ) 4 MG tablet [id number] Yes Take 4 mg by mouth at bedtime as needed for muscle spasms. [provider]  Active Self, Pharmacy Records, Multiple Informants  XIGDUO  XR 11-998 MG TB24 [id number] Yes TAKE 1 TABLET BY MOUTH EVERYDAY AT BEDTIME  Patient taking differently: Take 1 tablet by mouth at bedtime.   Wallace Joesph LABOR, PA  Active Self, Pharmacy Records, Multiple Informants            Home Care and Equipment/Supplies: Were Home Health Services Ordered?: NA Any new equipment or medical supplies ordered?: NA  Functional Questionnaire: Do you need assistance with bathing/showering or dressing?: No Do you need assistance with meal preparation?: No Do you need assistance with eating?: No Do  you have difficulty maintaining continence: No Do you need assistance with getting out of bed/getting out of a chair/moving?: No Do you have difficulty managing or taking your medications?: No  Follow up appointments reviewed: PCP Follow-up appointment  confirmed?: Yes Date of PCP follow-up appointment?: 08/29/23 Follow-up Provider: [facility] Follow-up appointment confirmed?: Yes Date of Specialist follow-up appointment?: 09/15/23 Follow-Up Specialty Provider:: cardio Do you need transportation to your follow-up appointment?: No Do you understand care options if your condition(s) worsen?: Yes-patient verbalized understanding    SIGNATURE Julian Lemmings, LPN [facility] Nurse Health Advisor Direct Dial [phone]

## 2023-08-29 ENCOUNTER — Emergency Department (HOSPITAL_BASED_OUTPATIENT_CLINIC_OR_DEPARTMENT_OTHER)

## 2023-08-29 ENCOUNTER — Other Ambulatory Visit: Payer: Self-pay

## 2023-08-29 ENCOUNTER — Emergency Department (HOSPITAL_BASED_OUTPATIENT_CLINIC_OR_DEPARTMENT_OTHER): Admission: EM | Admit: 2023-08-29 | Discharge: 2023-08-29 | Disposition: A | Discharge status: Home / Self Care

## 2023-08-29 ENCOUNTER — Encounter (HOSPITAL_BASED_OUTPATIENT_CLINIC_OR_DEPARTMENT_OTHER): Payer: Self-pay | Admitting: *Deleted

## 2023-08-29 ENCOUNTER — Inpatient Hospital Stay: Admitting: Family Medicine

## 2023-08-29 DIAGNOSIS — E119 Type 2 diabetes mellitus without complications: Secondary | ICD-10-CM | POA: Insufficient documentation

## 2023-08-29 DIAGNOSIS — I10 Essential (primary) hypertension: Secondary | ICD-10-CM | POA: Insufficient documentation

## 2023-08-29 DIAGNOSIS — Z8543 Personal history of malignant neoplasm of ovary: Secondary | ICD-10-CM | POA: Insufficient documentation

## 2023-08-29 DIAGNOSIS — Z7901 Long term (current) use of anticoagulants: Secondary | ICD-10-CM | POA: Diagnosis not present

## 2023-08-29 DIAGNOSIS — K859 Acute pancreatitis without necrosis or infection, unspecified: Secondary | ICD-10-CM | POA: Insufficient documentation

## 2023-08-29 DIAGNOSIS — R109 Unspecified abdominal pain: Secondary | ICD-10-CM | POA: Diagnosis present

## 2023-08-29 DIAGNOSIS — Z794 Long term (current) use of insulin: Secondary | ICD-10-CM | POA: Diagnosis not present

## 2023-08-29 LAB — CBC
HCT: 41.2 % (ref 36.0–46.0)
Hemoglobin: 13 g/dL (ref 12.0–15.0)
MCH: 26.7 pg (ref 26.0–34.0)
MCHC: 31.6 g/dL (ref 30.0–36.0)
MCV: 84.8 fL (ref 80.0–100.0)
Platelets: 208 K/uL (ref 150–400)
RBC: 4.86 MIL/uL (ref 3.87–5.11)
RDW: 14.2 % (ref 11.5–15.5)
WBC: 9.6 K/uL (ref 4.0–10.5)
nRBC: 0 % (ref 0.0–0.2)

## 2023-08-29 LAB — URINALYSIS, ROUTINE W REFLEX MICROSCOPIC
Bacteria, UA: NONE SEEN
Bilirubin Urine: NEGATIVE
Glucose, UA: >1000 mg/dL — AB
Hgb urine dipstick: NEGATIVE
Ketones, ur: NEGATIVE mg/dL
Leukocytes,Ua: NEGATIVE
Nitrite: NEGATIVE
Protein, ur: NEGATIVE mg/dL
Specific Gravity, Urine: 1.032 — ABNORMAL HIGH (ref 1.005–1.030)
pH: 5.5 (ref 5.0–8.0)

## 2023-08-29 LAB — COMPREHENSIVE METABOLIC PANEL WITH GFR
ALT: 19 U/L (ref 0–44)
AST: 23 U/L (ref 15–41)
Albumin: 4.3 g/dL (ref 3.5–5.0)
Alkaline Phosphatase: 123 U/L (ref 38–126)
Anion gap: 12 (ref 5–15)
BUN: 8 mg/dL (ref 6–20)
CO2: 24 mmol/L (ref 22–32)
Calcium: 9.9 mg/dL (ref 8.9–10.3)
Chloride: 101 mmol/L (ref 98–111)
Creatinine, Ser: 0.72 mg/dL (ref 0.44–1.00)
GFR, Estimated: >60 mL/min (ref >60)
Glucose, Bld: 199 mg/dL — ABNORMAL HIGH (ref 70–99)
Potassium: 4 mmol/L (ref 3.5–5.1)
Sodium: 137 mmol/L (ref 135–145)
Total Bilirubin: 0.4 mg/dL (ref 0.0–1.2)
Total Protein: 7.3 g/dL (ref 6.5–8.1)

## 2023-08-29 LAB — LIPASE, BLOOD: Lipase: 28 U/L (ref 11–51)

## 2023-08-29 MED ORDER — SODIUM CHLORIDE 0.9 % IV BOLUS
1000.0000 mL | Freq: Once | INTRAVENOUS | Status: AC
  Administered 2023-08-29: 1000 mL via INTRAVENOUS

## 2023-08-29 MED ORDER — OXYCODONE HCL 5 MG PO TABS: 5.0000 mg | ORAL_TABLET | ORAL | 0 refills | Status: DC | PRN

## 2023-08-29 MED ORDER — MORPHINE SULFATE (PF) 4 MG/ML IV SOLN
4.0000 mg | Freq: Once | INTRAVENOUS | Status: AC
  Administered 2023-08-29: 4 mg via INTRAVENOUS
  Filled 2023-08-29: qty 1

## 2023-08-29 MED ORDER — ONDANSETRON HCL 4 MG/2ML IJ SOLN
4.0000 mg | Freq: Once | INTRAMUSCULAR | Status: AC
  Administered 2023-08-29: 4 mg via INTRAVENOUS
  Filled 2023-08-29: qty 2

## 2023-08-29 MED ORDER — IOHEXOL 300 MG/ML  SOLN
100.0000 mL | Freq: Once | INTRAMUSCULAR | Status: AC | PRN
  Administered 2023-08-29: 100 mL via INTRAVENOUS

## 2023-08-29 MED ORDER — HYDROCODONE-ACETAMINOPHEN 5-325 MG PO TABS
1.0000 | ORAL_TABLET | Freq: Once | ORAL | Status: AC
  Administered 2023-08-29: 1 via ORAL
  Filled 2023-08-29: qty 1

## 2023-08-29 NOTE — ED Provider Notes (Signed)
 [facility] AT [facility] Provider Note   CSN: [id number] Arrival date & time: 08/29/23  1830     Patient presents with: Abdominal Pain   Morgan [name] is a 58 y.o. female.   58 year old female presents today for concern of abdominal pain that started this morning.  She states she woke up with this pain but the pain did not wake her up from sleep.  Endorses nausea but denies any vomiting.  Endorses history of hysterectomy but no other abdominal surgeries.  No fever.  Denies any changes in bowel habits.  Denies any dysuria.  The history is provided by the patient. No language interpreter was used.       Prior to Admission medications   Medication Sig Start Date End Date Taking? Authorizing Provider  acetaminophen  (TYLENOL ) 500 MG tablet Take 1,000 mg by mouth every 6 (six) hours as needed for mild pain (pain score 1-3).    [provider]  apixaban  (ELIQUIS ) 5 MG TABS tablet TAKE 1 TABLET BY MOUTH TWICE A DAY 11/01/22   Dann Candyce RAMAN, MD  DULoxetine  (CYMBALTA ) 60 MG capsule Take 120 mg by mouth at bedtime. 11/04/16   [provider]  flecainide  (TAMBOCOR ) 50 MG tablet Take 1 tablet (50 mg total) by mouth 2 (two) times daily. 06/13/23   Fenton, Clint R, PA  insulin  glargine (LANTUS ) 100 UNIT/ML Solostar Pen Inject 10 Units into the skin daily. Titrate up to goal of fasting glucose level between 100-140. Patient taking differently: Inject 30 Units into the skin daily. 07/04/23   Chandra Toribio POUR, MD  Lurasidone  HCl 60 MG TABS Take 60 mg by mouth daily at 12 noon.    [provider]  nebivolol  (BYSTOLIC ) 2.5 MG tablet Take 1 tablet (2.5 mg total) by mouth at bedtime. 10/26/22   Swinyer, Rosaline HERO, NP  omeprazole  (PRILOSEC ) 20 MG capsule Take 20 mg by mouth daily as needed (indigestion / heart burn).    [provider]  rosuvastatin  (CRESTOR ) 10 MG tablet TAKE 1 TABLET (10 MG TOTAL) BY MOUTH DAILY. 07/15/23   Mealor,  Augustus E, MD  tiZANidine  (ZANAFLEX ) 4 MG tablet Take 4 mg by mouth at bedtime as needed for muscle spasms. 05/14/21   [provider]  XIGDUO  XR 11-998 MG TB24 TAKE 1 TABLET BY MOUTH EVERYDAY AT BEDTIME Patient taking differently: Take 1 tablet by mouth at bedtime. 04/07/23   Wallace Joesph LABOR, PA    Allergies: Clindamycin/lincomycin, Lamictal [lamotrigine], Banana, Lipitor [atorvastatin ], and Pineapple    Review of Systems  Constitutional:  Negative for chills and fever.  Gastrointestinal:  Positive for abdominal pain and nausea. Negative for vomiting.  Genitourinary:  Negative for dysuria.  All other systems reviewed and are negative.   Updated Vital Signs BP 131/78   Pulse 77   Temp 97.9 F (36.6 C)   Resp 18   LMP 05/12/2011   SpO2 96%   Physical Exam Vitals and nursing note reviewed.  Constitutional:      General: She is not in acute distress.    Appearance: Normal appearance. She is not ill-appearing.  HENT:     Head: Normocephalic and atraumatic.     Nose: Nose normal.  Eyes:     Conjunctiva/sclera: Conjunctivae normal.  Cardiovascular:     Rate and Rhythm: Normal rate and regular rhythm.  Pulmonary:     Effort: Pulmonary effort is normal. No respiratory distress.  Abdominal:     General: There is no  distension.     Palpations: Abdomen is soft.     Tenderness: There is abdominal tenderness. There is no right CVA tenderness, left CVA tenderness or guarding.  Musculoskeletal:        General: No deformity. Normal range of motion.  Skin:    Findings: No rash.  Neurological:     Mental Status: She is alert.     (all labs ordered are listed, but only abnormal results are displayed) Labs Reviewed  COMPREHENSIVE METABOLIC PANEL WITH GFR - Abnormal; Notable for the following components:      Result Value   Glucose, Bld 199 (*)    All other components within normal limits  URINALYSIS, ROUTINE W REFLEX MICROSCOPIC - Abnormal; Notable for the following  components:   Specific Gravity, Urine 1.032 (*)    Glucose, UA >1,000 (*)    All other components within normal limits  LIPASE, BLOOD  CBC    EKG: None  Radiology: CT ABDOMEN PELVIS W CONTRAST Result Date: 08/29/2023 CLINICAL DATA:  Acute nonlocalized abdominal pain, flank pain EXAM: CT ABDOMEN AND PELVIS WITH CONTRAST TECHNIQUE: Multidetector CT imaging of the abdomen and pelvis was performed using the standard protocol following bolus administration of intravenous contrast. RADIATION DOSE REDUCTION: This exam was performed according to the departmental dose-optimization program which includes automated exposure control, adjustment of the mA and/or kV according to patient size and/or use of iterative reconstruction technique. CONTRAST:  OMNIPAQUE  IOHEXOL  300 MG/ML  SOLN COMPARISON:  None Available. FINDINGS: Lower chest: No acute abnormality. Hepatobiliary: Moderate hepatic steatosis. Stable 17 mm flash fill hemangioma within the subserosal right hepatic lobe (25/2). No intra or extrahepatic biliary ductal dilation. Gallbladder unremarkable. Pancreas: Interval development of mild peripancreatic inflammatory stranding surrounding the head of the pancreas in keeping with changes of mild acute pancreatitis. No loculated peripancreatic fluid collections. Normal enhancement of the pancreatic parenchyma. Pancreatic duct is not dilated. Spleen: Unremarkable Adrenals/Urinary Tract: The adrenal glands are unremarkable. Right kidney is normal in size and position. Left pelvic kidney noted without hydronephrosis and normal cortical enhancement. 2 mm nonobstructing calculus noted within the left kidney. No ureteral calculi. Bladder unremarkable. Stomach/Bowel: Stomach is within normal limits. Appendix absent. No evidence of bowel wall thickening, distention, or inflammatory changes. Vascular/Lymphatic: Left renal artery develops at the level of the aortic bifurcation and renal veins drain to the common iliac  veins bilaterally. The abdominal vasculature is otherwise unremarkable. No pathologic adenopathy within the abdomen and pelvis. Reproductive: Status post hysterectomy. No adnexal masses. Other: No abdominal wall hernia or abnormality. No abdominopelvic ascites. Musculoskeletal: L4-5 lumbar fusion with instrumentation with bridging callus involving the vertebral bodies and facet joints bilaterally. Benign bone island within the L2 vertebral body. No acute bone abnormality. No lytic or blastic bone lesion. IMPRESSION: 1. Interval development of mild peripancreatic inflammatory stranding surrounding the head of the pancreas in keeping with mild acute pancreatitis. No evidence of pancreatic necrosis. No loculated peripancreatic fluid collections. 2. Moderate hepatic steatosis. 3. Minimal nonobstructing left nephrolithiasis. 4. Left pelvic kidney. Electronically Signed   By: Dorethia Molt M.D.   On: 08/29/2023 20:20     Procedures   Medications Ordered in the ED  ondansetron  (ZOFRAN ) injection 4 mg (4 mg Intravenous Given 08/29/23 1958)  morphine  (PF) 4 MG/ML injection 4 mg (4 mg Intravenous Given 08/29/23 2000)  sodium chloride  0.9 % bolus 1,000 mL (0 mLs Intravenous Stopped 08/29/23 2136)  iohexol  (OMNIPAQUE ) 300 MG/ML solution 100 mL (100 mLs Intravenous Contrast Given 08/29/23 2002)  HYDROcodone -acetaminophen  (NORCO/VICODIN) 5-325 MG per tablet 1 tablet (1 tablet Oral Given 08/29/23 2207)    Clinical Course as of 08/29/23 2233  Mon Aug 29, 2023  2204 CT with evidence of mild pancreatitis.  Appears comfortable on reevaluation.  Will p.o. challenge. [AA]    Clinical Course User Index [AA] Hildegard Loge, PA-C                                 Medical Decision Making Amount and/or Complexity of Data Reviewed Labs: ordered. Radiology: ordered.  Risk Prescription drug management.   Medical Decision Making / ED Course   This patient presents to the ED for concern of abdominal pain, this involves an  extensive number of treatment options, and is a complaint that carries with it a high risk of complications and morbidity.  The differential diagnosis includes gastroenteritis, colitis, UTI, pyelonephritis, nephrolithiasis, pancreatitis, gastritis  MDM: 58 year old female presents today for concern of abdominal pain. Overall well-appearing. Hemodynamically stable. Will obtain labs, provide pain control, fluids, and obtain CT. CBC unremarkable, CMP without acute concern.  Glucose of 199.  Lipase within normal, UA without evidence of UTI. CT abdomen reveals mild pancreatitis. Patient tolerating p.o. Pain well-controlled. Discharged with pain control.  Supportive care discussed Strict return precautions discussed. Discharged in stable condition.   Lab Tests: -I ordered, reviewed, and interpreted labs.   The pertinent results include:   Labs Reviewed  COMPREHENSIVE METABOLIC PANEL WITH GFR - Abnormal; Notable for the following components:      Result Value   Glucose, Bld 199 (*)    All other components within normal limits  URINALYSIS, ROUTINE W REFLEX MICROSCOPIC - Abnormal; Notable for the following components:   Specific Gravity, Urine 1.032 (*)    Glucose, UA >1,000 (*)    All other components within normal limits  LIPASE, BLOOD  CBC      EKG  EKG Interpretation Date/Time:    Ventricular Rate:    PR Interval:    QRS Duration:    QT Interval:    QTC Calculation:   R Axis:      Text Interpretation:           Imaging Studies ordered: I ordered imaging studies including CT abdomen pelvis with contrast I independently visualized and interpreted imaging. I agree with the radiologist interpretation   Medicines ordered and prescription drug management: Meds ordered this encounter  Medications   ondansetron  (ZOFRAN ) injection 4 mg   morphine  (PF) 4 MG/ML injection 4 mg   sodium chloride  0.9 % bolus 1,000 mL   iohexol  (OMNIPAQUE ) 300 MG/ML solution 100 mL    HYDROcodone -acetaminophen  (NORCO/VICODIN) 5-325 MG per tablet 1 tablet    Refill:  0    -I have reviewed the patients home medicines and have made adjustments as needed   Reevaluation: After the interventions noted above, I reevaluated the patient and found that they have :improved  Co morbidities that complicate the patient evaluation  Past Medical History:  Diagnosis Date   Anxiety    Atrial fibrillation (HCC)    Bipolar disorder (HCC)    Breast mass    right, removed and benign   Cancer (HCC) 09/16/2019   ovarian ca no radiation no chemo   Cervical dysplasia    Diabetes mellitus without complication (HCC)    Endometriosis    GERD (gastroesophageal reflux disease)    Hypercholesteremia    Hypertension    Migraine  Ovarian cancer (HCC)    Pelvic kidney    left   Postpartum depression    hx of      Dispostion: Discharged in stable condition.  Return precaution discussed.  Patient voices understanding and is in agreement with plan.   Final diagnoses:  Acute pancreatitis without infection or necrosis, unspecified pancreatitis type    ED Discharge Orders          Ordered    oxyCODONE  (ROXICODONE ) 5 MG immediate release tablet  Every 4 hours PRN        08/29/23 2243               Hildegard Loge, PA-C 08/29/23 2244    Neysa Caron PARAS, DO 08/29/23 2321

## 2023-08-29 NOTE — Discharge Instructions (Signed)
 You have mild acute pancreatitis.  Treatment for this is pain control.  Your pain was well-controlled in the emergency department.  Drink plenty of fluids.  Follow-up with your primary care doctor.  Return for any concerning symptoms.

## 2023-08-29 NOTE — ED Triage Notes (Signed)
 Patient to ED reporting she woke at 730 today with left sided abd and flank pain. Nausea without vomiting or diarrhea. No fevers at home. No tenderness upon palpation.

## 2023-09-01 NOTE — Progress Notes (Deleted)
  Cardiology Office Note   Date:  09/01/2023  ID:  Morgan [name], DOB 1965-08-03, MRN [id number] PCP: Morgan [name]  [facility] Providers Cardiologist:  None Electrophysiologist:  Eulas FORBES Furbish, MD   History of Present Illness Morgan [name] is a 58 y.o. female with a past medical history of atrial fibrillation/flutter, HTN, HLD, ischemic colitis, type II DM, bipolar disorder, chronic pain syndrome. Patient presents today for a hospital follow up appointment   Patient has history of atrial fib and flutter. Was previously followed by Dr. Kelsie. In 04/2023, patient underwent afib ablation with Dr. Furbish. In afib clinic follow up after this, patient reported episodes of palpitations and chest pain. In 05/2023, he was started on flecainide . Admitted 5/12-5/13/25 due to dark, tarry stools. Her hemoglobin dipped from 13>11. She was instructed to hold her Eliquis  for 5 days then resume. Did not have further bleeding since then. When seen by EP in follow up 08/16/23, patient continued to report palpitations. HR fluctuated from the 40s-120s. EKG in clinic that day showed NSR. Cardiac monitor was ordered and showed ***.   Patient presented to the ED 08/20/23 for evaluation of left sided chest pain. Radiated to the arm and back. In the ED, EKG showed no ischemic changes. HsTn negative x2. Underwent coronary CTA that showed a coronary calcium  score of 0, nonobstructive CAD with noncalcified plaque causing minimal stenosis in left main, proximal RCA, and proximal Lcx. Recommended risk factor modification   Nonobstructive CAD  - Seen in the ED on 7/5 for evaluation of chest pain. hsTn negative x2. EKG showed sinus rhythm - Coronary CTA from 08/20/23 showed coronary calcium  score of 0, nonobstructive CAD with noncalcified plaque causing minimal stenosis in left main, proximal RCA, and proximal Lcx - Continue crestor  10 mg daily   Paroxysmal Atrial Fibrillation  - Underwent ablation in 04/2023  with Dr. Furbish. Has reported palpitations since then. Cardiac monitor 87/2025 showed *** -  - Continue eliquis  5 mg BID  - Continue flecainide  50 mg BID  - Continue nebivolol  2.5 mg BID   HTN  -  - Continue nebivolol  2.5 mg daily   ROS: ***  Studies Reviewed      *** Risk Assessment/Calculations {Does this patient have ATRIAL FIBRILLATION?[phone]} No BP recorded.  {Refresh Note OR Click here to enter BP  :1}***       Physical Exam VS:  LMP 05/12/2011        Wt Readings from Last 3 Encounters:  08/20/23 189 lb 11.2 oz (86 kg)  08/16/23 193 lb 3.2 oz (87.6 kg)  07/04/23 189 lb 0.6 oz (85.7 kg)    GEN: Well nourished, well developed in no acute distress NECK: No JVD; No carotid bruits CARDIAC: ***RRR, no murmurs, rubs, gallops RESPIRATORY:  Clear to auscultation without rales, wheezing or rhonchi  ABDOMEN: Soft, non-tender, non-distended EXTREMITIES:  No edema; No deformity   ASSESSMENT AND PLAN ***    {Are you ordering a CV Procedure (e.g. stress test, cath, DCCV, TEE, etc)?   Press F2        :[id number]}  Dispo: ***  Signed, Rollo FABIENE Louder, PA-C

## 2023-09-07 ENCOUNTER — Emergency Department (HOSPITAL_BASED_OUTPATIENT_CLINIC_OR_DEPARTMENT_OTHER)

## 2023-09-07 ENCOUNTER — Encounter (HOSPITAL_BASED_OUTPATIENT_CLINIC_OR_DEPARTMENT_OTHER): Payer: Self-pay

## 2023-09-07 ENCOUNTER — Emergency Department (HOSPITAL_BASED_OUTPATIENT_CLINIC_OR_DEPARTMENT_OTHER)
Admission: EM | Admit: 2023-09-07 | Discharge: 2023-09-07 | Disposition: A | Discharge status: Home / Self Care | Attending: Emergency Medicine | Admitting: Emergency Medicine

## 2023-09-07 ENCOUNTER — Other Ambulatory Visit: Payer: Self-pay

## 2023-09-07 DIAGNOSIS — I10 Essential (primary) hypertension: Secondary | ICD-10-CM | POA: Diagnosis not present

## 2023-09-07 DIAGNOSIS — K859 Acute pancreatitis without necrosis or infection, unspecified: Secondary | ICD-10-CM | POA: Diagnosis not present

## 2023-09-07 DIAGNOSIS — R109 Unspecified abdominal pain: Secondary | ICD-10-CM | POA: Diagnosis present

## 2023-09-07 DIAGNOSIS — Z79899 Other long term (current) drug therapy: Secondary | ICD-10-CM | POA: Diagnosis not present

## 2023-09-07 DIAGNOSIS — Z794 Long term (current) use of insulin: Secondary | ICD-10-CM | POA: Diagnosis not present

## 2023-09-07 DIAGNOSIS — Z7901 Long term (current) use of anticoagulants: Secondary | ICD-10-CM | POA: Diagnosis not present

## 2023-09-07 LAB — CBC
HCT: 42.8 % (ref 36.0–46.0)
Hemoglobin: 13.6 g/dL (ref 12.0–15.0)
MCH: 26.8 pg (ref 26.0–34.0)
MCHC: 31.8 g/dL (ref 30.0–36.0)
MCV: 84.3 fL (ref 80.0–100.0)
Platelets: 215 K/uL (ref 150–400)
RBC: 5.08 MIL/uL (ref 3.87–5.11)
RDW: 14 % (ref 11.5–15.5)
WBC: 7.5 K/uL (ref 4.0–10.5)
nRBC: 0 % (ref 0.0–0.2)

## 2023-09-07 LAB — COMPREHENSIVE METABOLIC PANEL WITH GFR
ALT: 24 U/L (ref 0–44)
AST: 36 U/L (ref 15–41)
Albumin: 4.3 g/dL (ref 3.5–5.0)
Alkaline Phosphatase: 117 U/L (ref 38–126)
Anion gap: 10 (ref 5–15)
BUN: 11 mg/dL (ref 6–20)
CO2: 28 mmol/L (ref 22–32)
Calcium: 10.5 mg/dL — ABNORMAL HIGH (ref 8.9–10.3)
Chloride: 100 mmol/L (ref 98–111)
Creatinine, Ser: 0.68 mg/dL (ref 0.44–1.00)
GFR, Estimated: >60 mL/min (ref >60)
Glucose, Bld: 273 mg/dL — ABNORMAL HIGH (ref 70–99)
Potassium: 3.7 mmol/L (ref 3.5–5.1)
Sodium: 138 mmol/L (ref 135–145)
Total Bilirubin: 0.4 mg/dL (ref 0.0–1.2)
Total Protein: 7.5 g/dL (ref 6.5–8.1)

## 2023-09-07 LAB — URINALYSIS, ROUTINE W REFLEX MICROSCOPIC
Bacteria, UA: NONE SEEN
Bilirubin Urine: NEGATIVE
Glucose, UA: >1000 mg/dL — AB
Hgb urine dipstick: NEGATIVE
Ketones, ur: NEGATIVE mg/dL
Leukocytes,Ua: NEGATIVE
Nitrite: NEGATIVE
Protein, ur: NEGATIVE mg/dL
Specific Gravity, Urine: 1.039 — ABNORMAL HIGH (ref 1.005–1.030)
pH: 5.5 (ref 5.0–8.0)

## 2023-09-07 LAB — LIPASE, BLOOD: Lipase: 18 U/L (ref 11–51)

## 2023-09-07 MED ORDER — MORPHINE SULFATE (PF) 4 MG/ML IV SOLN
4.0000 mg | Freq: Once | INTRAVENOUS | Status: AC
  Administered 2023-09-07: 4 mg via INTRAVENOUS
  Filled 2023-09-07: qty 1

## 2023-09-07 MED ORDER — SODIUM CHLORIDE 0.9 % IV BOLUS
1000.0000 mL | Freq: Once | INTRAVENOUS | Status: AC
  Administered 2023-09-07: 1000 mL via INTRAVENOUS

## 2023-09-07 MED ORDER — ONDANSETRON HCL 4 MG/2ML IJ SOLN
4.0000 mg | Freq: Once | INTRAMUSCULAR | Status: AC
Start: 2023-09-07 — End: 2023-09-07
  Administered 2023-09-07: 4 mg via INTRAVENOUS
  Filled 2023-09-07: qty 2

## 2023-09-07 MED ORDER — IOHEXOL 300 MG/ML  SOLN
100.0000 mL | Freq: Once | INTRAMUSCULAR | Status: AC | PRN
Start: 2023-09-07 — End: 2023-09-07
  Administered 2023-09-07: 100 mL via INTRAVENOUS

## 2023-09-07 MED ORDER — DIPHENHYDRAMINE HCL 25 MG PO CAPS
25.0000 mg | ORAL_CAPSULE | Freq: Once | ORAL | Status: AC
  Administered 2023-09-07: 25 mg via ORAL
  Filled 2023-09-07: qty 1

## 2023-09-07 NOTE — Discharge Instructions (Addendum)
 You were seen in the emergency department today for concerns of abdominal pain.  You are found to still have mild pancreatitis present on your CT scan but your lipase level was normal.  I am unsure what is causing your pancreatitis as you do not have any significant risk factors or on any medications that would induce the symptoms.  I have added on a lipid panel to assess for any possible triglyceride abnormalities.  Please follow-up with your primary care provider and your gastroenterology team for further evaluation.  Return the emergency department for concerns of new or worsening symptoms.

## 2023-09-07 NOTE — ED Notes (Signed)
 Patient transported to CT

## 2023-09-07 NOTE — ED Provider Notes (Signed)
  EMERGENCY DEPARTMENT AT [facility] Provider Note   CSN: [id number] Arrival date & time: 09/07/23  1553     Patient presents with: Abdominal Pain   Morgan [name] is a 58 y.o. female. Patient presents to the ED with concerns of abdominal pain. History significant for atrial fibrillation, hypertension, ischemic colitis, chronic pain syndrome. States that she was seen in the ED about one week ago and diagnosed with pancreatitis. States pain is primarily in the epigastrium. No reported vomiting or diarrhea but mild nausea. States pain came on randomly and tried taking oxycodone  10mg  at home with no relief in symptoms. Denies alcohol use or other substance use.   Abdominal Pain      Prior to Admission medications   Medication Sig Start Date End Date Taking? Authorizing Provider  acetaminophen  (TYLENOL ) 500 MG tablet Take 1,000 mg by mouth every 6 (six) hours as needed for mild pain (pain score 1-3).    [provider]  apixaban  (ELIQUIS ) 5 MG TABS tablet TAKE 1 TABLET BY MOUTH TWICE A DAY 11/01/22   Dann Candyce RAMAN, MD  DULoxetine  (CYMBALTA ) 60 MG capsule Take 120 mg by mouth at bedtime. 11/04/16   [provider]  flecainide  (TAMBOCOR ) 50 MG tablet Take 1 tablet (50 mg total) by mouth 2 (two) times daily. 06/13/23   Fenton, Clint R, PA  insulin  glargine (LANTUS ) 100 UNIT/ML Solostar Pen Inject 10 Units into the skin daily. Titrate up to goal of fasting glucose level between 100-140. Patient taking differently: Inject 30 Units into the skin daily. 07/04/23   Chandra Toribio POUR, MD  Lurasidone  HCl 60 MG TABS Take 60 mg by mouth daily at 12 noon.    [provider]  nebivolol  (BYSTOLIC ) 2.5 MG tablet Take 1 tablet (2.5 mg total) by mouth at bedtime. 10/26/22   Swinyer, Rosaline HERO, NP  omeprazole  (PRILOSEC ) 20 MG capsule Take 20 mg by mouth daily as needed (indigestion / heart burn).    [provider]  oxyCODONE  (ROXICODONE ) 5 MG  immediate release tablet Take 1 tablet (5 mg total) by mouth every 4 (four) hours as needed for severe pain (pain score 7-10). 08/29/23   Hildegard, Amjad, PA-C  rosuvastatin  (CRESTOR ) 10 MG tablet TAKE 1 TABLET (10 MG TOTAL) BY MOUTH DAILY. 07/15/23   Mealor, Augustus E, MD  tiZANidine  (ZANAFLEX ) 4 MG tablet Take 4 mg by mouth at bedtime as needed for muscle spasms. 05/14/21   [provider]  XIGDUO  XR 11-998 MG TB24 TAKE 1 TABLET BY MOUTH EVERYDAY AT BEDTIME Patient taking differently: Take 1 tablet by mouth at bedtime. 04/07/23   Wallace Joesph LABOR, PA    Allergies: Clindamycin/lincomycin, Lamictal [lamotrigine], Banana, Lipitor [atorvastatin ], and Pineapple    Review of Systems  Gastrointestinal:  Positive for abdominal pain.  All other systems reviewed and are negative.   Updated Vital Signs BP 136/84 (BP Location: Right Arm)   Pulse 70   Temp 98.7 F (37.1 C) (Oral)   Resp 18   Ht 5' 1 (1.549 m)   Wt 86.2 kg   LMP 05/12/2011   SpO2 94%   BMI 35.90 kg/m   Physical Exam Vitals and nursing note reviewed.  Constitutional:      General: She is not in acute distress.    Appearance: She is well-developed.  HENT:     Head: Normocephalic and atraumatic.  Eyes:     Conjunctiva/sclera: Conjunctivae normal.  Cardiovascular:     Rate and Rhythm: Normal  rate and regular rhythm.     Heart sounds: No murmur heard. Pulmonary:     Effort: Pulmonary effort is normal. No respiratory distress.     Breath sounds: Normal breath sounds.  Abdominal:     Palpations: Abdomen is soft.     Tenderness: There is abdominal tenderness in the epigastric area and left upper quadrant. There is no guarding. Negative signs include Murphy's sign and McBurney's sign.  Musculoskeletal:        General: No swelling.     Cervical back: Neck supple.  Skin:    General: Skin is warm and dry.     Capillary Refill: Capillary refill takes less than 2 seconds.  Neurological:     Mental Status: She is alert.   Psychiatric:        Mood and Affect: Mood normal.     (all labs ordered are listed, but only abnormal results are displayed) Labs Reviewed  COMPREHENSIVE METABOLIC PANEL WITH GFR - Abnormal; Notable for the following components:      Result Value   Glucose, Bld 273 (*)    Calcium  10.5 (*)    All other components within normal limits  URINALYSIS, ROUTINE W REFLEX MICROSCOPIC - Abnormal; Notable for the following components:   Specific Gravity, Urine 1.039 (*)    Glucose, UA >1,000 (*)    All other components within normal limits  LIPASE, BLOOD  CBC  LIPID PANEL    EKG: None  Radiology: CT ABDOMEN PELVIS W CONTRAST Result Date: 09/07/2023 CLINICAL DATA:  Epigastric pain EXAM: CT ABDOMEN AND PELVIS WITH CONTRAST TECHNIQUE: Multidetector CT imaging of the abdomen and pelvis was performed using the standard protocol following bolus administration of intravenous contrast. RADIATION DOSE REDUCTION: This exam was performed according to the departmental dose-optimization program which includes automated exposure control, adjustment of the mA and/or kV according to patient size and/or use of iterative reconstruction technique. CONTRAST:  OMNIPAQUE  IOHEXOL  300 MG/ML  SOLN COMPARISON:  CT 08/29/2023, 06/27/2023, 08/20/2023, 01/15/2020 FINDINGS: Lower chest: Lung bases demonstrate no acute airspace disease. Hepatobiliary: Hepatic steatosis. Hyperenhancing lesion in the right hepatic lobe measuring 19 mm, unchanged and consistent with hemangioma. No calcified gallstone or biliary dilatation Pancreas: No ductal dilatation. Minimal stranding at the pancreatic head and uncinate process, series 2, image 35 and coronal series 4, image 64. No organized fluid collections. Spleen: Normal in size without focal abnormality. Adrenals/Urinary Tract: Adrenal glands are normal. Left pelvic kidney with malrotation. Punctate left kidney stones. No hydronephrosis. The bladder is unremarkable Stomach/Bowel: The  stomach is nonenlarged. No dilated small bowel. No acute bowel wall thickening. Vascular/Lymphatic: No significant vascular findings are present. No enlarged abdominal or pelvic lymph nodes. Reproductive: Status post hysterectomy. No adnexal masses. Other: Negative for pelvic effusion or free air Musculoskeletal: Lumbar fusion hardware at L4-L5. No acute osseous abnormality. Stable sclerosis in the L2 vertebral body likely a bone island. IMPRESSION: 1. Minimal stranding at the pancreatic head and uncinate process, consistent with ongoing pancreatitis. No organized fluid collection. 2. Hepatic steatosis. 3. Left pelvic kidney with malrotation. Punctate left kidney stones. Electronically Signed   By: Luke Bun M.D.   On: 09/07/2023 18:30     Procedures   Medications Ordered in the ED  sodium chloride  0.9 % bolus 1,000 mL ( Intravenous Stopped 09/07/23 1934)  morphine  (PF) 4 MG/ML injection 4 mg (4 mg Intravenous Given 09/07/23 1807)  ondansetron  (ZOFRAN ) injection 4 mg (4 mg Intravenous Given 09/07/23 1806)  iohexol  (OMNIPAQUE ) 300 MG/ML  solution 100 mL (100 mLs Intravenous Contrast Given 09/07/23 1813)  diphenhydrAMINE  (BENADRYL ) capsule 25 mg (25 mg Oral Given 09/07/23 1902)                                    Medical Decision Making Amount and/or Complexity of Data Reviewed Labs: ordered. Radiology: ordered.  Risk Prescription drug management.   This patient presents to the ED for concern of abdominal pain. Differential diagnosis includes pancreatitis, pancreatic pseudocyst, chronic pain syndrome, gastroenteritis, dehydration, UTI   Lab Tests:  I Ordered, and personally interpreted labs.  The pertinent results include:  CBC unremarkable, CMP with slight hypercalcemia at 10.5, lipase normal at 18, UA with glucosuria secondary to medications   Imaging Studies ordered:  I ordered imaging studies including CT abdomen pelvis  I independently visualized and interpreted imaging which  showed Minimal stranding at the pancreatic head and uncinate process, consistent with ongoing pancreatitis. No organized fluid collection. 2. Hepatic steatosis. 3. Left pelvic kidney with malrotation. Punctate left kidney stones. I agree with the radiologist interpretation   Medicines ordered and prescription drug management:  I ordered medication including fluids, morphine , Zofran , Benadryl  for dehydration, pain, nausea, itching Reevaluation of the patient after these medicines showed that the patient improved I have reviewed the patients home medicines and have made adjustments as needed   Problem List / ED Course:  Patient has history significant for chronic pain syndrome, recently diagnosed pancreatitis, atrial fibrillation, hypertension presents to the ED with concerns of abdominal pain.  She reports that she was seen about 1 week ago for similar symptoms and diagnosed with pancreatitis.  Has been try to manage symptoms at home and had been doing well for the last week but developed pain today.  Tried taking oxycodone  10 mg without improvement in symptoms.  Has had some nausea but denies any vomiting or diarrhea.  No recent fever, chills, or bodyaches. On exam, there is tenderness to palpation in the epigastrium and the left upper quadrant.  No ecchymosis seen.  No other areas of abdomen or nontender.  Normal bowel sounds.  Heart and lung sounds unremarkable.  Based on presentation, concern for possible pancreatitis complication or rebound symptoms.  Will proceed with basic labs for assessment of hepatic biliary function, lipase for pancreatic assessment, and UA for signs of infection.  CT abdomen pelvis will also be repeated to assess for any complications of sequela of pancreatitis.  Fluids, morphine , and Zofran  given for symptom control. Patient's repeat evaluation shows no focal findings on his lab work.  Lipase remains negative at 18.  Has some slight hypercalcemia at 10.5 which could cause  some abdominal pain and possible pancreatitis but this level was normal last time she was seen and still had these pancreatic type symptoms.  Unclear if this could potentially be triglyceride mediated versus other cause.  Will add on lipid panel for further assessment advised patient to follow-up with primary care fighter regarding these findings.  Her CT imaging is consistent with mild pancreatitis still present but no other complicating features.  She does report improvement after morphine , fluids, and Zofran .  Benadryl  given after she developed some itching at the IV site.  At this time, unclear exact cause of patient's pancreatitis.  Does not appear to be alcohol or drug related as far as I can tell.  Suspect possible triglyceride mediation.  Encourage close follow-up with PCP and GI.  Otherwise  stable for outpatient follow-up and discharged home.   Social Determinants of Health:  Chronic pain syndrome  Final diagnoses:  Acute pancreatitis without infection or necrosis, unspecified pancreatitis type    ED Discharge Orders     None          Cecily Legrand DELENA DEVONNA 09/07/23 2026    Lenor Hollering, MD 09/09/23 1344

## 2023-09-07 NOTE — ED Triage Notes (Signed)
 Patient states seen here recently for pancreatitis. Told to come back to ER if it worsened. States pain is worse and she feels as if her abd is swollen

## 2023-09-07 NOTE — ED Notes (Signed)
 Pt d/c instructions, medications, and follow-up care reviewed with pt. Pt verbalized understanding and had no further questions at time of d/c. Pt CA&Ox4, ambulatory, and in NAD at time of d/c

## 2023-09-08 ENCOUNTER — Ambulatory Visit: Payer: Self-pay

## 2023-09-08 LAB — LIPID PANEL
Cholesterol: 147 mg/dL (ref 0–200)
HDL: 49 mg/dL (ref >40)
LDL Cholesterol: 59 mg/dL (ref 0–99)
Total CHOL/HDL Ratio: 3 ratio
Triglycerides: 197 mg/dL — ABNORMAL HIGH (ref <150)
VLDL: 39 mg/dL (ref 0–40)

## 2023-09-08 NOTE — Telephone Encounter (Signed)
 FYI Only or Action Required?: FYI only for provider.  Patient was last seen in primary care on 07/04/2023 by Chandra Toribio POUR, MD.  Called Nurse Triage reporting No chief complaint on file..  Symptoms began yesterday.  Interventions attempted: Prescription medications: Oxycodone .  Symptoms are: unchanged.  Triage Disposition: Go to ED Now (or PCP Triage)  Patient/caregiver understands and will follow disposition?: Yes   Copied from CRM #[id number]. Topic: Clinical - Red Word Triage >> Sep 08, 2023  2:47 PM Selinda RAMAN wrote: Red Word that prompted transfer to Nurse Triage: The patient called in stating she has been to the ER twice in the last week or 2 for pancreatitis and the pain meds she was given the oxyCODONE  (ROXICODONE ) 5 MG immediate release tablet is not helping. I will transfer her to E2C2 NT Reason for Disposition  Patient sounds very sick or weak to the triager  Answer Assessment - Initial Assessment Questions 1. LOCATION: Where does it hurt?      Abdominal Pain, LUQ  2. RADIATION: Does the pain shoot anywhere else? (e.g., chest, back)     Denies  3. ONSET: When did the pain begin? (e.g., minutes, hours or days ago)      Yesterday  4. SUDDEN: Gradual or sudden onset?     Sudden, Ongoing  5. PATTERN Does the pain come and go, or is it constant?     Constant  6. SEVERITY: How bad is the pain?  (e.g., Scale 1-10; mild, moderate, or severe)     Severe  7. RECURRENT SYMPTOM: Have you ever had this type of stomach pain before? If Yes, ask: When was the last time? and What happened that time?      Yes, Pancreatitis, Opioid Analgesics  8. CAUSE: What do you think is causing the stomach pain? (e.g., gallstones, recent abdominal surgery)     Pancreatitis  9. RELIEVING/AGGRAVATING FACTORS: What makes it better or worse? (e.g., antacids, bending or twisting motion, bowel movement)     Oxycodone - provides no relief  10. OTHER SYMPTOMS: Do you have any  other symptoms? (e.g., back pain, diarrhea, fever, urination pain, vomiting)       Nausea, Vomiting  11. PREGNANCY: Is there any chance you are pregnant? When was your last menstrual period?       No and No  Recently Diagnosed with Pancreatitis in the ER. Contacted the Gastroenterologist and was told that  Protocols used: Abdominal Pain - [facility]

## 2023-09-09 ENCOUNTER — Telehealth: Payer: Self-pay | Admitting: *Deleted

## 2023-09-09 NOTE — Telephone Encounter (Signed)
 Copied from CRM [id number]. Topic: Clinical - Medication Question >> Sep 09, 2023 12:36 PM Morgan [name] wrote: Reason for CRM: The patient called in stating she was taking insulin  glargine (LANTUS ) 100 UNIT/ML Solostar Pen starting at 10 units but she has titrated to 30 units now. She states her insurance needs for a new prescription stating 30 units to be sent into her pharmacy.  She uses   [facility] - [place], [place] - [address]  Phone: [phone] Fax: [phone]   Please assist patient further

## 2023-09-09 NOTE — Telephone Encounter (Signed)
 Called and LVM

## 2023-09-12 ENCOUNTER — Other Ambulatory Visit: Payer: Self-pay

## 2023-09-12 MED ORDER — INSULIN GLARGINE 100 UNIT/ML SOLOSTAR PEN: 30.0000 [IU] | PEN_INJECTOR | Freq: Every day | SUBCUTANEOUS | 3 refills | Status: AC

## 2023-09-12 NOTE — Telephone Encounter (Signed)
 LVM informing pt of below.

## 2023-09-15 ENCOUNTER — Ambulatory Visit: Admitting: Cardiology

## 2023-09-26 ENCOUNTER — Other Ambulatory Visit: Payer: Self-pay

## 2023-09-26 DIAGNOSIS — I480 Paroxysmal atrial fibrillation: Secondary | ICD-10-CM

## 2023-09-26 DIAGNOSIS — R002 Palpitations: Secondary | ICD-10-CM

## 2023-09-26 MED ORDER — APIXABAN 5 MG PO TABS
5.0000 mg | ORAL_TABLET | Freq: Two times a day (BID) | ORAL | 5 refills | Status: AC
Start: 2023-09-26 — End: ?

## 2023-09-26 NOTE — Telephone Encounter (Signed)
 Prescription refill request for Eliquis  received. Indication: Afib  Last office visit: 08/16/23 [place])  Scr: 0.68 (09/07/23) Age: 58 Weight: 86.2kg  Appropriate dose. Refill sent.

## 2023-09-28 ENCOUNTER — Ambulatory Visit: Payer: Self-pay | Admitting: Pulmonary Disease

## 2023-09-28 MED ORDER — NEBIVOLOL HCL 5 MG PO TABS: 5.0000 mg | ORAL_TABLET | Freq: Every day | ORAL | 1 refills | Status: DC

## 2023-10-04 ENCOUNTER — Ambulatory Visit

## 2023-10-18 NOTE — Progress Notes (Unsigned)
 Electrophysiology Office Note:    Date:  10/19/2023   ID:  LEMYA GREENWELL, DOB 07/06/1965, MRN [id number]  PCP:  Gayle Saddie Morgan [name]   [facility] Providers Cardiologist:  None Electrophysiologist:  Eulas FORBES Furbish, MD     Referring MD: Gayle Saddie JULIANNA, PA-C   History of Present Illness:    Morgan [name] is a 58 y.o. female with a medical history significant for HTN atrial flutter and fibrillation DM, referred for management of atrial fibrillation flutter.     She is a former patient of Dr. Kelsie.  ZIO monitor from May 2022 showed atrial fibrillation with a burden of less than 1% and a more regular narrow complex tachycardia thought to be atrial flutter.  There were also episodes of PVCs and bigeminy.  Her symptom episodes did not correlate with atrial fibrillation, however.  She was managed with diltiazem  but this was discontinued in March 2024 due to hypotension and bradycardia.  She was on nebivolol  but readmitted with A-fib with RVR.  She had increasingly frequent episodes of palpitations.  A monitor was placed that showed sinus rhythm and symptom episodes that correlated with sinus rhythm.  She presented to the ER in January 2 with headache and palpitations.  She had missed her nebivolol  and Eliquis  that a.m. ECG showed atrial fibrillation with RVR.  She spontaneously converted to sinus rhythm during the ER stay.  She underwent an uncomplicated ablation for atrial fibrillation on May 16, 2023 with ablation of the posterior wall and pulmonary veins.  She reports that her A-fib acceptable but worse after the ablation for a time period.  Flecainide  was started.  She wore a heart monitor in August 2025 that showed less than 1% burden of atrial fibrillation, longest duration was approximately 8 minutes.  She is quite symptomatic with episodes.      Today, she reports that she is well currently.    EKGs/Labs/Other Studies Reviewed Today:     Echocardiogram:  TTE  04/28/2022 EF 60-65%. Normal biatrial size.   Monitors:  14-day ZIO monitor July 2025 Predominant rhythm: Sinus Sinus HR: 50 - 123 bpm, AVG 72 bpm   < 1% atrial ectopy < 1% ventricular ectopy   Arrhythmia detected:  Atrial fibrillation: < 1% burden, rates not well controlled, 71-198 bpm, 124 bpm avg. Longest episode was less than 8 minutes.   Patient triggered events: were associated with atrial fibrillation and also with normal sinus rhythm    Zio monitor 13 days 07/2022 -- my interpretation Sinus rhythm heart rate 65-152 beats minute, average 100 bpm Less than 1% burden of atrial fibrillation with average rate of 144 bpm Episodes labeled SVT appear to be flutter or a very rapid atrial tachycardia Multiple symptom episodes correlated with sinus tachycardia, sinus rhythm PVCs and bigeminy.  The AF episode was not correlated with symptoms.  Zio monitor 14 days -- 03/14/2022 Sinus rhythm: HR 45 - 122, average 69 bpm. No atrial fibrillation detected. Rare supraventricular ectopy. Rare ventricular ectopy. No sustained arrhythmias. Symptom trigger episodes correspond to sinus rhythm    EKG:         Physical Exam:    VS:  BP 114/78 (BP Location: Right Arm, Patient Position: Sitting, Cuff Size: Large)   Pulse (!) 56   Ht 5' 1 (1.549 m)   Wt 186 lb (84.4 kg)   LMP 05/12/2011   SpO2 95%   BMI 35.14 kg/m     Wt Readings from Last 3 Encounters:  10/19/23 186 lb (84.4 kg)  09/07/23 190 lb (86.2 kg)  08/20/23 189 lb 11.2 oz (86 kg)     GEN: Well nourished, well developed in no acute distress CARDIAC: RRR, no murmurs, rubs, gallops RESPIRATORY:  Normal work of breathing MUSCULOSKELETAL: no edema    ASSESSMENT & PLAN:     Palpitations She describes recent episodes as forceful beats, sometimes with slow heart rate These correlated with sinus rhythm on her monitor  Paroxysmal atrial fibrillation History of RVR and difficult to control rates Chronic use of Abilify   limits and treatment choices ER admit January 2 with AF/RVR; S/p PFA ablation 04/2023  Monitor showing brief episodes of recurrence, lasting a few minutes, with poorly controlled rates.  She is quite symptomatic She is having recurrences despite being on flecainide . We discussed management options.  Using a shared decision making approach, we opted to schedule her for repeat mapping and possible ablation of atrial fibrillation.  We will plan to use cardio.  She will not need a CT.   Secondary hypercoagulable state Continue apixaban  5 mg twice daily   Possible sleep apnea -- will order itamar sleep study   Signed, Eulas FORBES Furbish, MD  10/19/2023 9:42 AM    [facility]

## 2023-10-19 ENCOUNTER — Encounter: Payer: Self-pay | Admitting: Cardiovascular Disease

## 2023-10-19 ENCOUNTER — Ambulatory Visit: Attending: Cardiovascular Disease | Admitting: Cardiovascular Disease

## 2023-10-19 VITALS — BP 114/78 | HR 56 | Ht 61.0 in | Wt 186.0 lb

## 2023-10-19 DIAGNOSIS — Z79899 Other long term (current) drug therapy: Secondary | ICD-10-CM

## 2023-10-19 DIAGNOSIS — Z01812 Encounter for preprocedural laboratory examination: Secondary | ICD-10-CM

## 2023-10-19 DIAGNOSIS — R002 Palpitations: Secondary | ICD-10-CM

## 2023-10-19 DIAGNOSIS — Z5181 Encounter for therapeutic drug level monitoring: Secondary | ICD-10-CM | POA: Diagnosis not present

## 2023-10-19 DIAGNOSIS — I480 Paroxysmal atrial fibrillation: Secondary | ICD-10-CM | POA: Diagnosis not present

## 2023-10-19 NOTE — Addendum Note (Signed)
 Addended by: CASIMIR ALDONA BRAVO on: 10/19/2023 10:07 AM   Modules accepted: Orders

## 2023-10-19 NOTE — Patient Instructions (Addendum)
 Medication Instructions:  Your physician recommends that you continue on your current medications as directed. Please refer to the Current Medication list given to you today.  *If you need a refill on your cardiac medications before your next appointment, please call your pharmacy*  Lab Work: CBC and BMET - please have pre-procedure lab work completed on Wednesday 11/16/23 . This can be done at ANY [facility] near you - no appointment required and this does not have to be fasting. If you have labs (blood work) drawn today and your tests are completely normal, you will receive your results only by: MyChart Message (if you have MyChart) OR A paper copy in the mail If you have any lab test that is abnormal or we need to change your treatment, we will call you to review the results.  Testing/Procedures: You will be contacted regarding the sleep study once we receive approval. Your physician has recommended that you have a sleep study. This test records several body functions during sleep, including: brain activity, eye movement, oxygen and carbon dioxide blood levels, heart rate and rhythm, breathing rate and rhythm, the flow of air through your mouth and nose, snoring, body muscle movements, and chest and belly movement.     Atrial Fibrillation Ablation - scheduled on Friday 12/02/2023 We will be in contact closer to your ablation date with further instructions Your physician has recommended that you have an ablation. Catheter ablation is a medical procedure used to treat some cardiac arrhythmias (irregular heartbeats). During catheter ablation, a long, thin, flexible tube is put into a blood vessel in your groin (upper thigh), or neck. This tube is called an ablation catheter. It is then guided to your heart through the blood vessel. Radio frequency waves destroy small areas of heart tissue where abnormal heartbeats may cause an arrhythmia to start. Please see the instruction sheet given to you  today.  Follow-Up: At [facility], you and your health needs are our priority.  As part of our continuing mission to provide you with exceptional heart care, our providers are all part of one team.  This team includes your primary Cardiologist (physician) and Advanced Practice Providers or APPs (Physician Assistants and Nurse Practitioners) who all work together to provide you with the care you need, when you need it.  Your next appointment:   We will schedule follow up after your ablation  Provider:   Dr Nancey   Cardiac Ablation Cardiac ablation is a procedure to destroy, or ablate, a small amount of heart tissue that is causing problems. The heart has many electrical connections. Sometimes, these connections are abnormal and can cause the heart to beat very fast or irregularly. Ablating the abnormal areas can improve the heart's rhythm or return it to normal. Ablation may be done for people who: Have irregular or rapid heartbeats (arrhythmias). Have Wolff-Parkinson-White syndrome. Have taken medicines for an arrhythmia that did not work or caused side effects. Have a high-risk heartbeat that may be life-threatening. Tell a health care provider about: Any allergies you have. All medicines you are taking, including vitamins, herbs, eye drops, creams, and over-the-counter medicines. Any problems you or family members have had with anesthesia. Any bleeding problems you have. Any surgeries you have had. Any medical conditions you have. Whether you are pregnant or may be pregnant. What are the risks? Your health care provider will talk with you about risks. These may include: Infection. Bruising and bleeding. Stroke or blood clots. Damage to nearby structures or organs.  Allergic reaction to medicines or dyes. Needing a pacemaker if the heart gets damaged. A pacemaker is a device that helps the heart beat normally. Failure of the procedure. A repeat procedure may be  needed. What happens before the procedure? Medicines Ask your health care provider about: Changing or stopping your regular medicines. These include any heart rhythm medicines, diabetes medicines, or blood thinners you take. Taking medicines such as aspirin  and ibuprofen . These medicines can thin your blood. Do not take them unless your health care provider tells you to. Taking over-the-counter medicines, vitamins, herbs, and supplements. General instructions Follow instructions from your health care provider about what you may eat and drink. If you will be going home right after the procedure, plan to have a responsible adult: Take you home from the hospital or clinic. You will not be allowed to drive. Care for you for the time you are told. Ask your health care provider what steps will be taken to prevent infection. What happens during the procedure?  An IV will be inserted into one of your veins. You may be given: A sedative. This helps you relax. Anesthesia. This will: Numb certain areas of your body. An incision will be made in your neck or your groin. A needle will be inserted through the incision and into a large vein in your neck or groin. The small, thin tube (catheter) will be inserted through the needle and moved to your heart. A type of X-ray (fluoroscopy) will be used to help guide the catheter and provide images of the heart on a monitor. Dye may be injected through the catheter to help your surgeon see the area of the heart that needs treatment. Electrical currents will be sent from the catheter to destroy heart tissue in certain areas. There are three types of energy that may be used to do this: Heat (radiofrequency energy). Laser energy. Extreme cold (cryoablation). When the tissue has been destroyed, the catheter will be removed. Pressure will be held on the insertion area to prevent bleeding. A bandage (dressing) will be placed over the insertion area. The procedure  may vary among health care providers and hospitals. What happens after the procedure? Your blood pressure, heart rate and rhythm, breathing rate, and blood oxygen level will be monitored until you leave the hospital or clinic. Your insertion area will be checked for bleeding. You will need to lie still for a few hours. If your groin was used, you will need to keep your leg straight for a few hours after the catheter is removed. This information is not intended to replace advice given to you by your health care provider. Make sure you discuss any questions you have with your health care provider. Document Revised: 07/21/2021 Document Reviewed: 07/21/2021 Elsevier Patient Education  2024 [employer].

## 2023-10-20 ENCOUNTER — Telehealth: Payer: Self-pay | Admitting: *Deleted

## 2023-10-20 NOTE — Telephone Encounter (Signed)
-----   Message from Nurse Aldona R sent at 10/19/2023  2:09 PM EDT ----- Regarding: Morgan [name] Please precert and schedule pt Morgan [name] Sleep Study

## 2023-10-21 NOTE — Telephone Encounter (Signed)
**Note De-Identified Garlon Tuggle Obfuscation** [employer] will get the PA and will mail a device to the pt Sacora Hawbaker mail.

## 2023-11-05 ENCOUNTER — Observation Stay (HOSPITAL_COMMUNITY)
Admission: EM | Admit: 2023-11-05 | Discharge: 2023-11-07 | Disposition: A | Discharge status: Home / Self Care | Attending: Internal Medicine | Admitting: Internal Medicine

## 2023-11-05 ENCOUNTER — Emergency Department (HOSPITAL_COMMUNITY)

## 2023-11-05 ENCOUNTER — Other Ambulatory Visit: Payer: Self-pay

## 2023-11-05 DIAGNOSIS — I952 Hypotension due to drugs: Secondary | ICD-10-CM | POA: Diagnosis not present

## 2023-11-05 DIAGNOSIS — R579 Shock, unspecified: Secondary | ICD-10-CM | POA: Diagnosis present

## 2023-11-05 DIAGNOSIS — F32A Depression, unspecified: Secondary | ICD-10-CM | POA: Insufficient documentation

## 2023-11-05 DIAGNOSIS — I4891 Unspecified atrial fibrillation: Secondary | ICD-10-CM | POA: Diagnosis not present

## 2023-11-05 DIAGNOSIS — G8929 Other chronic pain: Secondary | ICD-10-CM | POA: Diagnosis not present

## 2023-11-05 DIAGNOSIS — Z794 Long term (current) use of insulin: Secondary | ICD-10-CM | POA: Diagnosis not present

## 2023-11-05 DIAGNOSIS — E785 Hyperlipidemia, unspecified: Secondary | ICD-10-CM | POA: Insufficient documentation

## 2023-11-05 DIAGNOSIS — R42 Dizziness and giddiness: Secondary | ICD-10-CM | POA: Diagnosis present

## 2023-11-05 DIAGNOSIS — E119 Type 2 diabetes mellitus without complications: Secondary | ICD-10-CM | POA: Insufficient documentation

## 2023-11-05 LAB — CBG MONITORING, ED: Glucose-Capillary: 152 mg/dL — ABNORMAL HIGH (ref 70–99)

## 2023-11-05 LAB — I-STAT CG4 LACTIC ACID, ED: Lactic Acid, Venous: 2 mmol/L (ref 0.5–1.9)

## 2023-11-05 MED ORDER — SODIUM CHLORIDE 0.9 % IV BOLUS
500.0000 mL | Freq: Once | INTRAVENOUS | Status: AC
  Administered 2023-11-06: 500 mL via INTRAVENOUS

## 2023-11-05 NOTE — ED Triage Notes (Signed)
 Pt in from home with reported dizziness and hypotension (home monitor read 76/48). Pt states she took a newly prescribed medication (Fanapt) this evening and began to feel unwell at 8pm.   VS w/EMS: 78/58 50's-100's afib CBG 130

## 2023-11-05 NOTE — ED Provider Notes (Signed)
 [place] EMERGENCY DEPARTMENT AT [facility] Provider Note   CSN: [id number] Arrival date & time: 11/05/23  2309     Patient presents with: No chief complaint on file.   Morgan [name] is a 58 y.o. female.  {Add pertinent medical, surgical, social history, OB history to YEP:67052} The history is provided by the patient and medical records.  Morgan [name] is a 58 y.o. female who presents to the Emergency Department complaining of feeling bad. She presents to the emergency department by EMS for evaluation of feeling bad that started around 9 PM this evening. She states she felt very well prior to this. She checked her blood pressure at home and it was in the 70s systolic. She called EMS and they also had blood pressures in the 70s. She has associated nausea, generalized weakness. No headache, chest pain, difficulty breathing, abdominal pain, vomiting, dysuria. She did start a new bipolar medication today. Otherwise that she has been on her routine medications with no changes. No hematochezia or melena. She does have a history of atrial fibrillation and takes eliquis .      Prior to Admission medications   Medication Sig Start Date End Date Taking? Authorizing Provider  acetaminophen  (TYLENOL ) 500 MG tablet Take 1,000 mg by mouth every 6 (six) hours as needed for mild pain (pain score 1-3).    [provider]  apixaban  (ELIQUIS ) 5 MG TABS tablet Take 1 tablet (5 mg total) by mouth 2 (two) times daily. 09/26/23   Mealor, Augustus E, MD  DULoxetine  (CYMBALTA ) 60 MG capsule Take 120 mg by mouth at bedtime. 11/04/16   [provider]  flecainide  (TAMBOCOR ) 50 MG tablet Take 1 tablet (50 mg total) by mouth 2 (two) times daily. 06/13/23   Fenton, Clint R, PA  insulin  glargine (LANTUS ) 100 UNIT/ML Solostar Pen Inject 30 Units into the skin daily. 09/12/23   [name], PA-C  Lurasidone  HCl 60 MG TABS Take 60 mg by mouth daily at 12 noon.    [provider]   nebivolol  (BYSTOLIC ) 5 MG tablet Take 1 tablet (5 mg total) by mouth daily. 09/28/23   Aniceto Daphne CROME, NP  omeprazole  (PRILOSEC ) 20 MG capsule Take 20 mg by mouth daily as needed (indigestion / heart burn).    [provider]  oxyCODONE  (ROXICODONE ) 5 MG immediate release tablet Take 1 tablet (5 mg total) by mouth every 4 (four) hours as needed for severe pain (pain score 7-10). 08/29/23   Hildegard Loge, PA-C  rosuvastatin  (CRESTOR ) 10 MG tablet TAKE 1 TABLET (10 MG TOTAL) BY MOUTH DAILY. 07/15/23   Mealor, Augustus E, MD  tiZANidine  (ZANAFLEX ) 4 MG tablet Take 4 mg by mouth at bedtime as needed for muscle spasms. 05/14/21   [provider]  XIGDUO  XR 11-998 MG TB24 TAKE 1 TABLET BY MOUTH EVERYDAY AT BEDTIME Patient taking differently: Take 1 tablet by mouth at bedtime. 04/07/23   Wallace Joesph LABOR, PA    Allergies: Clindamycin/lincomycin, Lamictal [lamotrigine], Banana, Lipitor [atorvastatin ], and Pineapple    Review of Systems  Updated Vital Signs BP (!) 84/60   Pulse 63   Temp 98.1 F (36.7 C)   Resp 16   LMP 05/12/2011   SpO2 95%   Physical Exam  (all labs ordered are listed, but only abnormal results are displayed) Labs Reviewed - No data to display  EKG: None  Radiology: No results found.  {Document cardiac monitor, telemetry assessment procedure when appropriate:32947} Procedures  Medications Ordered in the ED - No data to display    {Click here for ABCD2, HEART and other calculators REFRESH Note before signing:1}                              Medical Decision Making  ***  {Document critical care time when appropriate  Document review of labs and clinical decision tools ie CHADS2VASC2, etc  Document your independent review of radiology images and any outside records  Document your discussion with family members, caretakers and with consultants  Document social determinants of health affecting pt's care  Document your decision making why or why  not admission, treatments were needed:32947:::1}   Final diagnoses:  None    ED Discharge Orders     None

## 2023-11-05 NOTE — ED Notes (Signed)
 Nt called CCMD@11 :40pm

## 2023-11-06 ENCOUNTER — Other Ambulatory Visit: Payer: Self-pay

## 2023-11-06 ENCOUNTER — Encounter (HOSPITAL_COMMUNITY): Payer: Self-pay | Admitting: Pulmonary Disease

## 2023-11-06 DIAGNOSIS — F32A Depression, unspecified: Secondary | ICD-10-CM | POA: Diagnosis not present

## 2023-11-06 DIAGNOSIS — I4891 Unspecified atrial fibrillation: Secondary | ICD-10-CM

## 2023-11-06 DIAGNOSIS — I10 Essential (primary) hypertension: Secondary | ICD-10-CM

## 2023-11-06 DIAGNOSIS — E119 Type 2 diabetes mellitus without complications: Secondary | ICD-10-CM

## 2023-11-06 DIAGNOSIS — I952 Hypotension due to drugs: Secondary | ICD-10-CM | POA: Diagnosis not present

## 2023-11-06 LAB — CBC WITH DIFFERENTIAL/PLATELET
Abs Immature Granulocytes: 0.03 K/uL (ref 0.00–0.07)
Basophils Absolute: 0.1 K/uL (ref 0.0–0.1)
Basophils Relative: 1 %
Eosinophils Absolute: 0.2 K/uL (ref 0.0–0.5)
Eosinophils Relative: 2 %
HCT: 38.3 % (ref 36.0–46.0)
Hemoglobin: 12 g/dL (ref 12.0–15.0)
Immature Granulocytes: 0 %
Lymphocytes Relative: 26 %
Lymphs Abs: 2.2 K/uL (ref 0.7–4.0)
MCH: 26.7 pg (ref 26.0–34.0)
MCHC: 31.3 g/dL (ref 30.0–36.0)
MCV: 85.1 fL (ref 80.0–100.0)
Monocytes Absolute: 0.5 K/uL (ref 0.1–1.0)
Monocytes Relative: 5 %
Neutro Abs: 5.4 K/uL (ref 1.7–7.7)
Neutrophils Relative %: 66 %
Platelets: 220 K/uL (ref 150–400)
RBC: 4.5 MIL/uL (ref 3.87–5.11)
RDW: 15 % (ref 11.5–15.5)
WBC: 8.3 K/uL (ref 4.0–10.5)
nRBC: 0 % (ref 0.0–0.2)

## 2023-11-06 LAB — URINALYSIS, ROUTINE W REFLEX MICROSCOPIC
Bilirubin Urine: NEGATIVE
Glucose, UA: >=500 mg/dL — AB
Hgb urine dipstick: NEGATIVE
Ketones, ur: NEGATIVE mg/dL
Leukocytes,Ua: NEGATIVE
Nitrite: NEGATIVE
Protein, ur: NEGATIVE mg/dL
Specific Gravity, Urine: 1.006 (ref 1.005–1.030)
pH: 5 (ref 5.0–8.0)

## 2023-11-06 LAB — COMPREHENSIVE METABOLIC PANEL WITH GFR
ALT: 26 U/L (ref 0–44)
AST: 33 U/L (ref 15–41)
Albumin: 3.3 g/dL — ABNORMAL LOW (ref 3.5–5.0)
Alkaline Phosphatase: 80 U/L (ref 38–126)
Anion gap: 11 (ref 5–15)
BUN: 13 mg/dL (ref 6–20)
CO2: 22 mmol/L (ref 22–32)
Calcium: 9.3 mg/dL (ref 8.9–10.3)
Chloride: 103 mmol/L (ref 98–111)
Creatinine, Ser: 0.9 mg/dL (ref 0.44–1.00)
GFR, Estimated: >60 mL/min (ref >60)
Glucose, Bld: 159 mg/dL — ABNORMAL HIGH (ref 70–99)
Potassium: 4.4 mmol/L (ref 3.5–5.1)
Sodium: 136 mmol/L (ref 135–145)
Total Bilirubin: 0.6 mg/dL (ref 0.0–1.2)
Total Protein: 6.2 g/dL — ABNORMAL LOW (ref 6.5–8.1)

## 2023-11-06 LAB — GLUCOSE, CAPILLARY
Glucose-Capillary: 167 mg/dL — ABNORMAL HIGH (ref 70–99)
Glucose-Capillary: 160 mg/dL — ABNORMAL HIGH (ref 70–99)
Glucose-Capillary: 151 mg/dL — ABNORMAL HIGH (ref 70–99)

## 2023-11-06 LAB — CBG MONITORING, ED
Glucose-Capillary: 119 mg/dL — ABNORMAL HIGH (ref 70–99)
Glucose-Capillary: 140 mg/dL — ABNORMAL HIGH (ref 70–99)
Glucose-Capillary: 103 mg/dL — ABNORMAL HIGH (ref 70–99)
Glucose-Capillary: 126 mg/dL — ABNORMAL HIGH (ref 70–99)

## 2023-11-06 LAB — TROPONIN I (HIGH SENSITIVITY)
Troponin I (High Sensitivity): 2 ng/L (ref <18)
Troponin I (High Sensitivity): <2 ng/L (ref <18)

## 2023-11-06 LAB — MAGNESIUM: Magnesium: 1.7 mg/dL (ref 1.7–2.4)

## 2023-11-06 LAB — I-STAT CG4 LACTIC ACID, ED: Lactic Acid, Venous: 1.8 mmol/L (ref 0.5–1.9)

## 2023-11-06 MED ORDER — NOREPINEPHRINE 4 MG/250ML-% IV SOLN
0.0000 ug/min | INTRAVENOUS | Status: DC
Start: 2023-11-06 — End: 2023-11-07
  Administered 2023-11-06: 5 ug/min via INTRAVENOUS
  Filled 2023-11-06: qty 250

## 2023-11-06 MED ORDER — APIXABAN 5 MG PO TABS
5.0000 mg | ORAL_TABLET | Freq: Two times a day (BID) | ORAL | Status: DC
  Administered 2023-11-06 – 2023-11-07 (×3): 5 mg via ORAL
  Filled 2023-11-06 (×3): qty 1

## 2023-11-06 MED ORDER — LACTATED RINGERS IV SOLN
INTRAVENOUS | Status: AC
Start: 2023-11-06 — End: 2023-11-07

## 2023-11-06 MED ORDER — APIXABAN 5 MG PO TABS
5.0000 mg | ORAL_TABLET | Freq: Two times a day (BID) | ORAL | Status: DC
Start: 2023-11-06 — End: 2023-11-06

## 2023-11-06 MED ORDER — HEPARIN SODIUM (PORCINE) 5000 UNIT/ML IJ SOLN: 5000.0000 [IU] | Freq: Three times a day (TID) | INTRAMUSCULAR | Status: DC

## 2023-11-06 MED ORDER — ACETAMINOPHEN 500 MG PO TABS: 1000.0000 mg | ORAL_TABLET | Freq: Four times a day (QID) | ORAL | Status: DC | PRN

## 2023-11-06 MED ORDER — DOCUSATE SODIUM 100 MG PO CAPS: 100.0000 mg | ORAL_CAPSULE | Freq: Two times a day (BID) | ORAL | Status: DC | PRN

## 2023-11-06 MED ORDER — INSULIN ASPART 100 UNIT/ML IJ SOLN
3.0000 [IU] | INTRAMUSCULAR | Status: DC
  Administered 2023-11-06: 3 [IU] via SUBCUTANEOUS
  Administered 2023-11-06 (×3): 6 [IU] via SUBCUTANEOUS
  Administered 2023-11-07: 3 [IU] via SUBCUTANEOUS

## 2023-11-06 MED ORDER — SODIUM CHLORIDE 0.9 % IV BOLUS
500.0000 mL | Freq: Once | INTRAVENOUS | Status: AC
  Administered 2023-11-06: 500 mL via INTRAVENOUS

## 2023-11-06 MED ORDER — FLECAINIDE ACETATE 50 MG PO TABS
50.0000 mg | ORAL_TABLET | Freq: Two times a day (BID) | ORAL | Status: DC
  Administered 2023-11-06 (×2): 50 mg via ORAL
  Filled 2023-11-06 (×3): qty 1

## 2023-11-06 MED ORDER — MIDODRINE HCL 5 MG PO TABS
10.0000 mg | ORAL_TABLET | Freq: Three times a day (TID) | ORAL | Status: DC
  Administered 2023-11-06 (×3): 10 mg via ORAL
  Filled 2023-11-06 (×3): qty 2

## 2023-11-06 MED ORDER — PANTOPRAZOLE SODIUM 40 MG PO TBEC
40.0000 mg | DELAYED_RELEASE_TABLET | Freq: Every day | ORAL | Status: DC
  Administered 2023-11-06: 40 mg via ORAL
  Filled 2023-11-06: qty 1

## 2023-11-06 MED ORDER — POLYETHYLENE GLYCOL 3350 17 G PO PACK: 17.0000 g | PACK | Freq: Every day | ORAL | Status: DC | PRN

## 2023-11-06 MED ORDER — SODIUM CHLORIDE 0.9 % IV BOLUS
1000.0000 mL | Freq: Once | INTRAVENOUS | Status: AC
  Administered 2023-11-06: 1000 mL via INTRAVENOUS

## 2023-11-06 MED ORDER — ROSUVASTATIN CALCIUM 5 MG PO TABS
10.0000 mg | ORAL_TABLET | Freq: Every day | ORAL | Status: DC
  Administered 2023-11-06: 10 mg via ORAL
  Filled 2023-11-06: qty 2

## 2023-11-06 MED ORDER — CHLORHEXIDINE GLUCONATE CLOTH 2 % EX PADS: 6.0000 | MEDICATED_PAD | Freq: Every day | CUTANEOUS | Status: DC

## 2023-11-06 NOTE — H&P (Addendum)
 NAME:  Morgan [name], MRN:  [id number], DOB:  [birth year], LOS: 0 ADMISSION DATE:  11/05/2023, CONSULTATION DATE:  11/06/2023 REFERRING MD: Griselda Norris, MD, CHIEF COMPLAINT:  hypotension and dizziness for few hours   History of Present Illness:  A 58 year old female patient with depression, bipolar, Afib, HTN, DM-2, dyslipidemia, and chronic pain who presented to ED feeling dizziness esp with ambulation after taking new psych medication (Fanapt 1 mg last night 8 pm), started around 9 PM last night. Otherwise that she has been on her routine medications with no changes. She stated she felt very well prior to this. She checked her blood pressure at home and it was in the 70s systolic. She called EMS and they also had blood pressures in the 70s. She has associated nausea and generalized weakness. No headache, palpitations, chest pain, SOB, cough, wheezing, vomiting, diarrhea, abd pain, dysuria, or fever. No hematochezia or melena. Denied smoking, illicit drug, and alcohol drinking.   Pertinent  Medical History  Ablation 04/2023  Significant Hospital Events: Including procedures, antibiotic start and stop dates in addition to other pertinent events   9/20: ED eval, given 2 L NS 0.9% boluses, continued to have BP 70/40, started on Levophed  9/21: PCCM consulted for admission to ICU  Interim History / Subjective:    Objective    Blood pressure (!) 100/34, pulse (!) 56, temperature 97.7 F (36.5 C), resp. rate 17, last menstrual period 05/12/2011, SpO2 100%.        Intake/Output Summary (Last 24 hours) at 11/06/2023 0412 Last data filed at 11/06/2023 0118 Gross per 24 hour  Intake 408.33 ml  Output --  Net 408.33 ml   There were no vitals filed for this visit.  Examination: General: alert, oriented x4, and comfortable. On RA. SpO2 99%  HENT: PERL, normal pharynx and oral mucosa. No LNE or thyromegaly. No JVD Lungs: symmetrical air entry bilaterally. No crackles or  wheezing Cardiovascular: NL S1/S2. No m/g/r Abdomen: no distension or tenderness Extremities: no edema. Symmetrical  Neuro: nonfocal   Resolved problem list   Assessment and Plan  Hypotension due to interaction between Fanapt and multiple psych meds like Duloxetine  and Lurasidone . -Admit to ICU -Midodrine  -Wean Levophed  -Hold psych meds for now  -Echo  Depression/bipolar -Hold psych meds for now  Afib, HTN, dyslipidemia -Hold BB -Resume Flecainide , NOAC and statin  DM-2 -ISS   Chronic pain  -Hold opioids for now -Tylenol  PRN  Lactic acidosis: resolved with IVF  Labs   CBC: Recent Labs  Lab 11/05/23 2341  WBC 8.3  NEUTROABS 5.4  HGB 12.0  HCT 38.3  MCV 85.1  PLT 220    Basic Metabolic Panel: Recent Labs  Lab 11/05/23 2341  NA 136  K 4.4  CL 103  CO2 22  GLUCOSE 159*  BUN 13  CREATININE 0.90  CALCIUM  9.3   GFR: CrCl cannot be calculated (Unknown ideal weight.). Recent Labs  Lab 11/05/23 2341 11/05/23 2342 11/06/23 0137  WBC 8.3  --   --   LATICACIDVEN  --  2.0* 1.8    Liver Function Tests: Recent Labs  Lab 11/05/23 2341  AST 33  ALT 26  ALKPHOS 80  BILITOT 0.6  PROT 6.2*  ALBUMIN  3.3*   No results for input(s): LIPASE, AMYLASE in the last 168 hours. No results for input(s): AMMONIA in the last 168 hours.  ABG    Component Value Date/Time   HCO3 28.8 (H) 06/12/2022 2217   TCO2 30 06/12/2022  2217   O2SAT 66 06/12/2022 2217     Coagulation Profile: No results for input(s): INR, PROTIME in the last 168 hours.  Cardiac Enzymes: No results for input(s): CKTOTAL, CKMB, CKMBINDEX, TROPONINI in the last 168 hours.  HbA1C: Hemoglobin A1C  Date/Time Value Ref Range Status  07/04/2023 05:06 PM 8.3 (A) 4.0 - 5.6 % Final   HbA1c POC (<> result, manual entry)  Date/Time Value Ref Range Status  01/04/2023 09:34 AM 8.4 4.0 - 5.6 % Final   Hgb A1c MFr Bld  Date/Time Value Ref Range Status  03/30/2023 08:46 AM  8.2 (H) 4.8 - 5.6 % Final    Comment:             Prediabetes: 5.7 - 6.4          Diabetes: >6.4          Glycemic control for adults with diabetes: <7.0   06/23/2022 10:21 AM 10.0 (H) 4.8 - 5.6 % Final    Comment:             Prediabetes: 5.7 - 6.4          Diabetes: >6.4          Glycemic control for adults with diabetes: <7.0     CBG: Recent Labs  Lab 11/05/23 2317  GLUCAP 152*    Review of Systems:   Review of Systems  Constitutional:  Positive for malaise/fatigue. Negative for chills, diaphoresis, fever and weight loss.  Respiratory:  Negative for cough, hemoptysis, sputum production, shortness of breath and wheezing.   Cardiovascular:  Negative for chest pain, palpitations, orthopnea, claudication, leg swelling and PND.  Gastrointestinal:  Negative for abdominal pain, blood in stool, constipation, diarrhea, melena, nausea and vomiting.  Genitourinary:  Positive for frequency. Negative for dysuria and urgency.  Musculoskeletal:  Positive for back pain and neck pain.  Skin:  Negative for itching and rash.  Neurological:  Positive for dizziness. Negative for sensory change, speech change, focal weakness, seizures, loss of consciousness and headaches.     Past Medical History:  She,  has a past medical history of Anxiety, Atrial fibrillation (HCC), Bipolar disorder (HCC), Breast mass, Cancer (HCC) (09/16/2019), Cervical dysplasia, Diabetes mellitus without complication (HCC), Endometriosis, GERD (gastroesophageal reflux disease), Hypercholesteremia, Hypertension, Migraine, Ovarian cancer (HCC), Pelvic kidney, and Postpartum depression.   Surgical History:   Past Surgical History:  Procedure Laterality Date   ATRIAL FIBRILLATION ABLATION N/A 05/16/2023   Procedure: ATRIAL FIBRILLATION ABLATION;  Surgeon: Nancey Eulas BRAVO, MD;  Location: [facility];  Service: Cardiovascular;  Laterality: N/A;   BACK SURGERY  02/16/2012   BREAST BIOPSY Right 2016   BREAST BIOPSY  Right 2020   BREAST EXCISIONAL BIOPSY Right 2021   BREAST EXCISIONAL BIOPSY Right 2016   benign   BREAST LUMPECTOMY WITH RADIOACTIVE SEED LOCALIZATION Right 10/28/2014   Procedure: RIGHT BREAST LUMPECTOMY WITH RADIOACTIVE SEED LOCALIZATION;  Surgeon: Deward Null III, MD;  Location: [facility];  Service: General;  Laterality: Right;   BREAST LUMPECTOMY WITH RADIOACTIVE SEED LOCALIZATION Right 06/18/2019   Procedure: RIGHT BREAST RADIOACTIVE SEED LOCALIZATION LUMPECTOMY;  Surgeon: Null Deward MOULD, MD;  Location: [facility];  Service: General;  Laterality: Right;   BREAST SURGERY N/A    Phreesia 07/30/2019   CESAREAN SECTION  02/15/2001   COLONOSCOPY WITH PROPOFOL  N/A 05/01/2018   Procedure: COLONOSCOPY WITH PROPOFOL ;  Surgeon: Therisa Bi, MD;  Location: [facility] END[facility]COPY;  Service: Gastroenterology;  Laterality: N/A;  COLPOSCOPY     ESOPHAGOGASTRODUODENOSCOPY (EGD) WITH PROPOFOL  N/A 05/01/2018   Procedure: ESOPHAGOGASTRODUODENOSCOPY (EGD) WITH PROPOFOL ;  Surgeon: Therisa Bi, MD;  Location: [facility]COPY;  Service: Gastroenterology;  Laterality: N/A;   ESOPHAGOGASTRODUODENOSCOPY (EGD) WITH PROPOFOL  N/A 11/13/2018   Procedure: ESOPHAGOGASTRODUODENOSCOPY (EGD) WITH PROPOFOL ;  Surgeon: Therisa Bi, MD;  Location: [facility] ENDOSCOPY;  Service: Gastroenterology;  Laterality: N/A;   GYNECOLOGIC CRYOSURGERY     NECK SURGERY  02/15/2010   PELVIC LAPAROSCOPY  02/15/1989   TOTAL ABDOMINAL HYSTERECTOMY N/A    Phreesia 07/30/2019     Social History:   reports that she has never smoked. She has never been exposed to tobacco smoke. She has never used smokeless tobacco. She reports that she does not drink alcohol and does not use drugs.   Family History:  Her family history includes Alcohol abuse in her father; Breast cancer (age of onset: 23) in her mother; Depression in her maternal grandmother and mother; Diabetes in her maternal grandmother and mother; Heart attack (age of  onset: 46) in her father; Heart disease in her father; Hypertension in her father; Stroke in her father; Throat cancer in her father; Thyroid  disease in her mother; Uterine cancer in her maternal grandmother.   Allergies Allergies  Allergen Reactions   Clindamycin/Lincomycin Rash    Diffuse drug reaction eruption   Lamictal [Lamotrigine] Rash   Banana Diarrhea   Lipitor [Atorvastatin ] Other (See Comments)    Severe fatigue   Pineapple Diarrhea     Home Medications  Prior to Admission medications   Medication Sig Start Date End Date Taking? Authorizing Provider  acetaminophen  (TYLENOL ) 500 MG tablet Take 1,000 mg by mouth every 6 (six) hours as needed for mild pain (pain score 1-3).    [provider]  apixaban  (ELIQUIS ) 5 MG TABS tablet Take 1 tablet (5 mg total) by mouth 2 (two) times daily. 09/26/23   Mealor, Augustus E, MD  DULoxetine  (CYMBALTA ) 60 MG capsule Take 120 mg by mouth at bedtime. 11/04/16   [provider]  flecainide  (TAMBOCOR ) 50 MG tablet Take 1 tablet (50 mg total) by mouth 2 (two) times daily. 06/13/23   Fenton, Clint R, PA  insulin  glargine (LANTUS ) 100 UNIT/ML Solostar Pen Inject 30 Units into the skin daily. 09/12/23   [name], PA-C  Lurasidone  HCl 60 MG TABS Take 60 mg by mouth daily at 12 noon.    [provider]  nebivolol  (BYSTOLIC ) 5 MG tablet Take 1 tablet (5 mg total) by mouth daily. 09/28/23   Aniceto Daphne CROME, NP  omeprazole  (PRILOSEC ) 20 MG capsule Take 20 mg by mouth daily as needed (indigestion / heart burn).    [provider]  oxyCODONE  (ROXICODONE ) 5 MG immediate release tablet Take 1 tablet (5 mg total) by mouth every 4 (four) hours as needed for severe pain (pain score 7-10). 08/29/23   Hildegard, Amjad, PA-C  rosuvastatin  (CRESTOR ) 10 MG tablet TAKE 1 TABLET (10 MG TOTAL) BY MOUTH DAILY. 07/15/23   Mealor, Augustus E, MD  tiZANidine  (ZANAFLEX ) 4 MG tablet Take 4 mg by mouth at bedtime as needed for muscle spasms. 05/14/21    [provider]  XIGDUO  XR 11-998 MG TB24 TAKE 1 TABLET BY MOUTH EVERYDAY AT BEDTIME Patient taking differently: Take 1 tablet by mouth at bedtime. 04/07/23   Wallace Joesph LABOR, PA     Critical care time: 50 min   Mancel Ply, MD [place] [facility] Pager: see AMION

## 2023-11-06 NOTE — Progress Notes (Signed)
 Pt off pressors and now hypertensive. Will admit to progressive. TRH to assume care in am.

## 2023-11-06 NOTE — Progress Notes (Signed)
   11/06/23 1448  Vitals  Temp Source Oral  BP 136/66  MAP (mmHg) 83  BP Location Right Arm  BP Method Automatic  Patient Position (if appropriate) Lying  Pulse Rate 70  Pulse Rate Source Monitor  ECG Heart Rate 68  Level of Consciousness  Level of Consciousness Alert  MEWS COLOR  MEWS Score Color Green  Oxygen Therapy  SpO2 97 %  O2 Device Room Air  MEWS Score  MEWS Temp 0  MEWS Systolic 0  MEWS Pulse 0  MEWS RR 1  MEWS LOC 0  MEWS Score 1   PT admitted to the unit from ED. PT is Ax0x4 . PT VSS and  assessment completed. Pt is oriented to the unit. Pt belongings are at the bedside.Pt pain level upon arrival is 0/10. CCMD called and tele appilied. Bed in lowest position and call bell with in reach.

## 2023-11-06 NOTE — Plan of Care (Signed)

## 2023-11-07 ENCOUNTER — Observation Stay (HOSPITAL_COMMUNITY)

## 2023-11-07 ENCOUNTER — Telehealth: Payer: Self-pay

## 2023-11-07 DIAGNOSIS — I952 Hypotension due to drugs: Secondary | ICD-10-CM | POA: Diagnosis not present

## 2023-11-07 LAB — GLUCOSE, CAPILLARY
Glucose-Capillary: 122 mg/dL — ABNORMAL HIGH (ref 70–99)
Glucose-Capillary: 117 mg/dL — ABNORMAL HIGH (ref 70–99)

## 2023-11-07 MED ORDER — NEBIVOLOL HCL 5 MG PO TABS: 5.0000 mg | ORAL_TABLET | Freq: Every evening | ORAL | Status: AC

## 2023-11-07 NOTE — Telephone Encounter (Signed)
-----   Message from Nurse Aldona R sent at 10/19/2023  1:41 PM EDT ----- Regarding: Afib Ablation 10/17 with Mealor Important: list procedure date as first item in subject line, followed by procedure type (e.g., 10/28/23 AFib ablation)  Precert:  MD: Mealor Type of ablation: A-fib Diagnosis: afib CPT code: A-fib (06343) Ablation scheduled (date/time): 12/02/23 at 730am  Procedure:  Added to calendar? Yes Orders entered? No, >30 days before procedure Letter complete? No, >30 days before procedure Scheduled with cath lab? Yes Any medications to hold? Yes (please list hold instructions): Xigduo  - 72 hours Labs ordered (CBC, BMET, PT/INR if on warfarin): Yes Mapping system: CARTO (lab 4 or 6) CARTO/OPAL rep notified? Yes Cardiac CT needed? No Dye allergy? No Pre-meds ordered and instructions given? N/A Letter method: MyChart H&P: 9/3 Device: No  Follow-up:  Cassie/Angel, please schedule Routine.  Covering RN - please send this message to [employer], EP scheduler, EP Scheduling pool, EP [employer], and CT scheduler ([place] Lynch/Stephanie Mogg), if indicated.

## 2023-11-07 NOTE — Plan of Care (Signed)

## 2023-11-07 NOTE — Progress Notes (Signed)
 Order to discharge patient home. Discharge instructions/AVS given to and reviewed with patient. Education provided as needed . Patient verbalized understanding. 2 PIV'S removed by the RN. Personal belongings sent home with the patient.Home via private vehicle.

## 2023-11-07 NOTE — Discharge Summary (Signed)
 Physician Discharge Summary  Patient ID: Morgan [name] MRN: [id number] DOB/AGE: 56/22/1967 56 y.o.  Admit date: 11/05/2023 Discharge date: 11/07/2023   Discharge Diagnoses:  Polypharmacy induced shock state  Discharged Condition: stable  Hospital Course:  A 58 year old female patient with depression, bipolar, Afib, HTN, DM-2, dyslipidemia, and chronic pain who presented to ED feeling dizziness esp with ambulation after taking new psych medication (Fanapt 1 mg last night 8 pm), started around 9 PM last night. Otherwise that she has been on her routine medications with no changes. She stated she felt very well prior to this. She checked her blood pressure at home and it was in the 70s systolic. She called EMS and they also had blood pressures in the 70s. She has associated nausea and generalized weakness. No headache, palpitations, chest pain, SOB, cough, wheezing, vomiting, diarrhea, abd pain, dysuria, or fever. No hematochezia or melena. Denied smoking, illicit drug, and alcohol drinking.   Fanapt was stopped, BP quickly normalized, was given midodrine  but this was no longer needed.  On day of DC, ambulatory, tolerating diet.  If med re-introduced as OP need to consider lower dose + midodrine ; alternatively with her heart history may need to consider a different medication.  Discharge Exam: Blood pressure 129/82, pulse 74, temperature 97.6 F (36.4 C), temperature source Oral, resp. rate 20, height 5' 1 (1.549 m), weight 87.2 kg, last menstrual period 05/12/2011, SpO2 96%. No distress Moves to command Lungs clear Heart sounds regular Abd soft Aox3 Full affect  Disposition: home   Allergies as of 11/07/2023       Reactions   Clindamycin/lincomycin Rash   Diffuse drug reaction eruption   Lamictal [lamotrigine] Rash   Banana Diarrhea   Lipitor [atorvastatin ] Other (See Comments)   Severe fatigue   Pineapple Diarrhea   Watermelon Concentrate [citrullus Vulgaris] Rash         Medication List     TAKE these medications    acetaminophen  500 MG tablet Commonly known as: TYLENOL  Take 1,000 mg by mouth every 6 (six) hours as needed for mild pain (pain score 1-3).   apixaban  5 MG Tabs tablet Commonly known as: Eliquis  Take 1 tablet (5 mg total) by mouth 2 (two) times daily.   diphenhydramine -acetaminophen  25-500 MG Tabs tablet Commonly known as: TYLENOL  PM Take 3 tablets by mouth at bedtime.   DULoxetine  60 MG capsule Commonly known as: CYMBALTA  Take 120 mg by mouth at bedtime.   flecainide  50 MG tablet Commonly known as: TAMBOCOR  Take 1 tablet (50 mg total) by mouth 2 (two) times daily.   insulin  glargine 100 UNIT/ML Solostar Pen Commonly known as: LANTUS  Inject 30 Units into the skin daily.   Lurasidone  HCl 60 MG Tabs Take 40 mg by mouth daily at 12 noon.   nebivolol  5 MG tablet Commonly known as: Bystolic  Take 1 tablet (5 mg total) by mouth at bedtime.   omeprazole  20 MG capsule Commonly known as: PRILOSEC  Take 20 mg by mouth daily as needed (indigestion / heart burn).   oxyCODONE  5 MG immediate release tablet Commonly known as: Roxicodone  Take 1 tablet (5 mg total) by mouth every 4 (four) hours as needed for severe pain (pain score 7-10).   rosuvastatin  10 MG tablet Commonly known as: CRESTOR  TAKE 1 TABLET (10 MG TOTAL) BY MOUTH DAILY. What changed: when to take this   tiZANidine  4 MG tablet Commonly known as: ZANAFLEX  Take 4 mg by mouth at bedtime.   Xigduo  XR 11-998 MG  Tb24 Generic drug: Dapagliflozin  Pro-metFORMIN  ER TAKE 1 TABLET BY MOUTH EVERYDAY AT BEDTIME       21 min arranging dc  Signed: Toribio JAYSON Sharps 11/07/2023, 8:16 AM

## 2023-11-07 NOTE — Telephone Encounter (Signed)
Work up complete. 

## 2023-11-08 ENCOUNTER — Telehealth: Payer: Self-pay

## 2023-11-08 ENCOUNTER — Other Ambulatory Visit (HOSPITAL_COMMUNITY): Payer: Self-pay | Admitting: Physician Assistant

## 2023-11-08 ENCOUNTER — Telehealth (HOSPITAL_COMMUNITY): Payer: Self-pay

## 2023-11-08 NOTE — Transitions of Care (Post Inpatient/ED Visit) (Signed)
 11/08/2023  Name: Morgan [name] MRN: [id number] DOB: [birth year]  Today's TOC FU Call Status: Today's TOC FU Call Status:: Successful TOC FU Call Completed TOC FU Call Complete Date: 11/08/23 Patient's Name and Date of Birth confirmed.  Transition Care Management Follow-up Telephone Call Date of Discharge: 11/07/23 Discharge Facility: [facility]) Type of Discharge: Inpatient Admission Primary Inpatient Discharge Diagnosis:: shock How have you been since you were released from the hospital?: Better Any questions or concerns?: No  Items Reviewed: Did you receive and understand the discharge instructions provided?: Yes Medications obtained,verified, and reconciled?: Yes (Medications Reviewed) Any new allergies since your discharge?: No Dietary orders reviewed?: Yes Do you have support at home?: Yes People in Home [RPT]: child(ren), adult  Medications Reviewed Today: Medications Reviewed Today     Reviewed by Emmitt Pan, LPN (Licensed Practical Nurse) on 11/08/23 at 1508  Med List Status: <None>   Medication Order Taking? Sig Documenting Provider Last Dose Status Informant  acetaminophen  (TYLENOL ) 500 MG tablet [id number] Yes Take 1,000 mg by mouth every 6 (six) hours as needed for mild pain (pain score 1-3). [provider]  Active Self, Pharmacy Records  apixaban  (ELIQUIS ) 5 MG TABS tablet [id number] Yes Take 1 tablet (5 mg total) by mouth 2 (two) times daily. Mealor, Augustus E, MD  Active Self, Pharmacy Records  diphenhydramine -acetaminophen  (TYLENOL  PM) 25-500 MG TABS tablet [id number] Yes Take 3 tablets by mouth at bedtime. [provider]  Active Self, Pharmacy Records  DULoxetine  (CYMBALTA ) 60 MG capsule [id number] Yes Take 120 mg by mouth at bedtime. [provider]  Active Self, Pharmacy Records  flecainide  (TAMBOCOR ) 50 MG tablet [id number] Yes TAKE 1 TABLET BY MOUTH 2 TIMES DAILY. Fenton, Clint R, [place]  Active   insulin  glargine (LANTUS )  100 UNIT/ML Solostar Pen [id number] Yes Inject 30 Units into the skin daily. [name], PA-C  Active Self, Pharmacy Records  Lurasidone  HCl 60 MG TABS [id number] Yes Take 40 mg by mouth daily at 12 noon. [provider]  Active Self, Pharmacy Records           Med Note (LEE, NICOLE   Sun Nov 06, 2023  5:23 AM) Pt. Is weaning off this medication.   nebivolol  (BYSTOLIC ) 5 MG tablet [id number] Yes Take 1 tablet (5 mg total) by mouth at bedtime. Claudene Toribio BROCKS, MD  Active   omeprazole  (PRILOSEC ) 20 MG capsule [id number] Yes Take 20 mg by mouth daily as needed (indigestion / heart burn). [provider]  Active Self, Pharmacy Records  oxyCODONE  (ROXICODONE ) 5 MG immediate release tablet [id number] Yes Take 1 tablet (5 mg total) by mouth every 4 (four) hours as needed for severe pain (pain score 7-10). Hildegard Loge, PA-C  Active Self, Pharmacy Records           Med Note LEOBARDO, NAT Repress Nov 06, 2023  5:25 AM) LD:  July 2025  rosuvastatin  (CRESTOR ) 10 MG tablet [id number] Yes TAKE 1 TABLET (10 MG TOTAL) BY MOUTH DAILY.  Patient taking differently: Take 10 mg by mouth at bedtime.   Mealor, Augustus E, MD  Active Self, Pharmacy Records  tiZANidine  (ZANAFLEX ) 4 MG tablet [id number] Yes Take 4 mg by mouth at bedtime. [provider]  Active Self, Pharmacy Records  XIGDUO  XR 11-998 MG TB24 [id number] Yes TAKE 1 TABLET BY MOUTH EVERYDAY AT BEDTIME Wallace Joesph LABOR, PA  Active Self, Pharmacy Records            Home  Care and Equipment/Supplies: Were Home Health Services Ordered?: NA Any new equipment or medical supplies ordered?: NA  Functional Questionnaire: Do you need assistance with bathing/showering or dressing?: No Do you need assistance with meal preparation?: No Do you need assistance with eating?: No Do you have difficulty maintaining continence: No Do you need assistance with getting out of bed/getting out of a chair/moving?: No Do you have difficulty managing or  taking your medications?: No  Follow up appointments reviewed: PCP Follow-up appointment confirmed?: NA Specialist Hospital Follow-up appointment confirmed?: Yes (talked to on phone) Follow-Up Specialty Provider:: pych Do you need transportation to your follow-up appointment?: No Do you understand care options if your condition(s) worsen?: Yes-patient verbalized understanding    SIGNATURE Julian Lemmings, LPN [facility] Nurse Health Advisor Direct Dial [phone]

## 2023-11-08 NOTE — Telephone Encounter (Signed)
 Spoke with patient to complete pre-procedure call.     Health status review:  Any new medical conditions, recent signs of acute illness or been started on antibiotics? No Any recent hospitalizations or surgeries? Yes; Hospital admission 9/20- 9/22 Dx: hypotension due to Fanapt. Reports BP has improved since stopping medication. Systolic range 110-130 and diastolic range 70s.    Follow all medication instructions prior to procedure or the procedure may be rescheduled:    Continue taking Eliquis  (Apixaban ) twice daily without missing any doses before procedure. Essential chronic medications:  No medication should be continued, unless told otherwise.  Lantus  (Insulin ): Take you usual morning dose on the day before your procedure. The morning of your procedure you will only take  of your usual dose.    HOLD: Dapagliflozin /Metformin  (Xigduo ) for 3 days prior to the procedure. Last dose on Monday, October 13.  On the morning of your procedure DO NOT take any medication., including Eliquis  (Apixaban ).  Nothing to eat or drink after midnight prior to your procedure.  Pre-procedure testing scheduled: lab work completed on September 20.  Confirmed patient is scheduled for Atrial Fibrillation Ablation on Friday, October 17 with Dr. Eulas Furbish. Instructed patient to arrive at the Main Entrance A at [facility]: [address] [place], [place] [address] and check in at Admitting at 5:30 AM.  Advised of plan to go home the same day and will only stay overnight if medically necessary. You MUST have a responsible adult to drive you home and MUST be with you the first 24 hours after you arrive home or your procedure could be cancelled.  Informed patient a nurse will call a day before the procedure to confirm arrival time and ensure instructions are followed.  Patient verbalized understanding to information provided and is agreeable to proceed with procedure.   Advised patient to contact RN  Navigator at [phone], to inform of any new medications started after call or concerns prior to procedure.

## 2023-11-14 ENCOUNTER — Other Ambulatory Visit: Payer: Self-pay | Admitting: Family Medicine

## 2023-11-14 DIAGNOSIS — E1169 Type 2 diabetes mellitus with other specified complication: Secondary | ICD-10-CM

## 2023-11-17 ENCOUNTER — Other Ambulatory Visit: Payer: Self-pay

## 2023-11-17 DIAGNOSIS — E1169 Type 2 diabetes mellitus with other specified complication: Secondary | ICD-10-CM

## 2023-11-17 MED ORDER — DAPAGLIFLOZIN PRO-METFORMIN ER 10-1000 MG PO TB24: ORAL_TABLET | ORAL | 1 refills | Status: AC

## 2023-11-17 NOTE — Telephone Encounter (Unsigned)
 Copied from CRM [id number]. Topic: Clinical - Medication Refill >> Nov 17, 2023 12:35 PM Santiya F wrote: Medication: XIGDUO  XR 11-998 MG TB24 [[id number]]  Has the patient contacted their pharmacy? Yes  (Agent: If yes, when and what did the pharmacy advise?) Contact office, pharmacy sent a request but hasn't heard back from the office   This is the patient's preferred pharmacy:   [id number] [address], [place] - [address] [place] [address] Phone: [phone] Fax: [phone]  Is this the correct pharmacy for this prescription? Yes If no, delete pharmacy and type the correct one.   Has the prescription been filled recently? Yes  Is the patient out of the medication? No  Has the patient been seen for an appointment in the last year OR does the patient have an upcoming appointment? Yes  Can we respond through MyChart? Yes  Agent: Please be advised that Rx refills may take up to 3 business days. We ask that you follow-up with your pharmacy.

## 2023-11-23 ENCOUNTER — Ambulatory Visit

## 2023-11-23 VITALS — BP 99/64 | HR 56 | Temp 97.4°F | Ht 61.0 in | Wt 187.1 lb

## 2023-11-23 DIAGNOSIS — E1169 Type 2 diabetes mellitus with other specified complication: Secondary | ICD-10-CM

## 2023-11-23 DIAGNOSIS — E785 Hyperlipidemia, unspecified: Secondary | ICD-10-CM

## 2023-11-23 DIAGNOSIS — Z794 Long term (current) use of insulin: Secondary | ICD-10-CM

## 2023-11-23 DIAGNOSIS — F319 Bipolar disorder, unspecified: Secondary | ICD-10-CM

## 2023-11-23 DIAGNOSIS — I480 Paroxysmal atrial fibrillation: Secondary | ICD-10-CM | POA: Diagnosis not present

## 2023-11-23 LAB — POCT GLYCOSYLATED HEMOGLOBIN (HGB A1C): Hemoglobin A1C: 7.1 % — AB (ref 4.0–5.6)

## 2023-11-23 MED ORDER — TIRZEPATIDE 2.5 MG/0.5ML ~~LOC~~ SOAJ: 2.5000 mg | SUBCUTANEOUS | 2 refills | Status: DC

## 2023-11-23 NOTE — Patient Instructions (Signed)
 VISIT SUMMARY: Today, we discussed your diabetes management, weight loss, and other health concerns. Your A1c has improved, and we reviewed your current medications. We also talked about your recent pancreatitis, mental health, and upcoming heart procedure.  YOUR PLAN: TYPE 2 DIABETES MELLITUS: Your A1c has improved to 7.1%. -Continue taking 30 units of Lantus  in the morning. -Continue taking Xigduo  at night. -Monitor your blood glucose daily. -Recheck your A1c in 3 months. -We may consider reducing Lantus  if your A1c continues to improve.  FATTY LIVER DISEASE: You have a history of fatty liver disease and need to focus on weight loss. -Discuss the possibility of using Wellbutrin with your psychiatrist for its weight loss benefits. -Continue with lifestyle modifications to help with weight loss.  ACUTE PANCREATITIS: You had pancreatitis in July 2023. -Avoid using Mounjaro and Ozempic  for at least one year after your pancreatitis.  ATRIAL FIBRILLATION: You are scheduled for a second heart ablation and are on medications for atrial fibrillation. -Proceed with your scheduled heart ablation. -Continue taking Eliquis  5 mg twice daily. -Continue taking flecainide .  HYPERTENSION: Your high blood pressure is being managed with medication. -Continue taking nebivolol . -Continue regular home blood pressure monitoring.  HYPERLIPIDEMIA: You have high cholesterol managed with medication. -Continue taking rosuvastatin  at bedtime. -A cholesterol panel will be ordered.  DEPRESSION AND BIPOLAR DISORDER: Your depression has worsened, and we are considering new treatment options. -Discuss the possibility of using Wellbutrin with your psychiatrist for its mood and weight loss benefits. -We provided information on mental health urgent care in [facility] for emergency support.  If you have any problems before your next visit feel free to message me via MyChart (minor issues or questions) or call the  office, otherwise you may reach out to schedule an office visit.  Thank you! Saddie Sacks, PA-C

## 2023-11-23 NOTE — Progress Notes (Signed)
 Established Patient Office Visit  Subjective   Patient ID: Morgan [name], female    DOB: 03-01-1965  Age: 58 y.o. MRN: [id number]  Chief Complaint  Patient presents with   Medical Management of Chronic Issues    HPI  Discussed the use of AI scribe software for clinical note transcription with the patient, who gave verbal consent to proceed.  History of Present Illness   Morgan [name] is a 58 year old female with type 2 diabetes and fatty liver disease who presents for diabetes management and weight loss consultation.  Glycemic control and diabetes management - Type 2 diabetes mellitus with recent improvement in A1c from 8.3% in May to 7.1% currently - Current regimen includes Lantus  30 units in the morning and Xigduo  at night - No hypoglycemic episodes, including weakness or dizziness  Weight management - Difficulty controlling weight despite efforts - Previously used Ozempic  for weight loss with good effect, discontinued as it was intended for short-term use  Hepatic dysfunction and fatty liver disease - History of fatty liver disease - Experienced pancreatitis in July, resulting in two hospitalizations for pain - Since pancreatitis, experiences intermittent days of feeling 'really bad,' with fatigue and low energy - Uncertain if symptoms are related to liver condition  Neuropsychiatric symptoms - Worsening mental health with recent changes in psychiatric medications, including Cymbalta  and Caplyta - Follows with psychiatry and has follow up scheduled in 3 weeks - Not currently participating  in therapy - Occasional thoughts of self-harm, but feels safe to self. No current SI/HI ideations and no active plan to harm.  - Uncertain if fatigue and low energy are related to depression          ROS Per HPI.    Objective:     BP 99/64   Pulse (!) 56   Temp (!) 97.4 F (36.3 C) (Oral)   Ht 5' 1 (1.549 m)   Wt 187 lb 1.3 oz (84.9 kg)   LMP 05/12/2011   SpO2  96%   BMI 35.35 kg/m    Physical Exam Constitutional:      General: She is not in acute distress.    Appearance: Normal appearance.  Cardiovascular:     Rate and Rhythm: Normal rate and regular rhythm.     Heart sounds: Normal heart sounds. No murmur heard.    No friction rub. No gallop.  Pulmonary:     Effort: Pulmonary effort is normal. No respiratory distress.     Breath sounds: Normal breath sounds.  Musculoskeletal:        General: No swelling.  Skin:    General: Skin is warm and dry.  Neurological:     General: No focal deficit present.     Mental Status: She is alert.  Psychiatric:        Mood and Affect: Mood normal.        Behavior: Behavior normal.        Thought Content: Thought content normal.    Results for orders placed or performed in visit on 11/23/23  POCT HgB A1C  Result Value Ref Range   Hemoglobin A1C 7.1 (A) 4.0 - 5.6 %   HbA1c POC (<> result, manual entry)     HbA1c, POC (prediabetic range)     HbA1c, POC (controlled diabetic range)      Last CBC Lab Results  Component Value Date   WBC 8.3 11/05/2023   HGB 12.0 11/05/2023   HCT 38.3 11/05/2023   MCV  85.1 11/05/2023   MCH 26.7 11/05/2023   RDW 15.0 11/05/2023   PLT 220 11/05/2023   Last metabolic panel Lab Results  Component Value Date   GLUCOSE 159 (H) 11/05/2023   NA 136 11/05/2023   K 4.4 11/05/2023   CL 103 11/05/2023   CO2 22 11/05/2023   BUN 13 11/05/2023   CREATININE 0.90 11/05/2023   GFRNONAA >60 11/05/2023   CALCIUM  9.3 11/05/2023   PHOS 2.5 04/29/2022   PROT 6.2 (L) 11/05/2023   ALBUMIN  3.3 (L) 11/05/2023   LABGLOB 2.7 03/30/2023   AGRATIO 1.4 06/23/2022   BILITOT 0.6 11/05/2023   ALKPHOS 80 11/05/2023   AST 33 11/05/2023   ALT 26 11/05/2023   ANIONGAP 11 11/05/2023   Last lipids Lab Results  Component Value Date   CHOL 147 09/07/2023   HDL 49 09/07/2023   LDLCALC 59 09/07/2023   TRIG 197 (H) 09/07/2023   CHOLHDL 3.0 09/07/2023   Last hemoglobin A1c Lab  Results  Component Value Date   HGBA1C 7.1 (A) 11/23/2023   Last thyroid  functions Lab Results  Component Value Date   TSH 1.070 06/23/2022   T4TOTAL 8.2 08/25/2018   Last vitamin D No results found for: 25OHVITD2, 25OHVITD3, VD25OH    The 10-year ASCVD risk score (Arnett DK, et al., 2019) is: 2.8%    Assessment & Plan:   Type 2 diabetes mellitus with other specified complication, with long-term current use of insulin  (HCC) Assessment & Plan: A1c improved to 7.1%.  Mounjaro contraindicated due to pancreatitis. - Continue 30 units of Lantus  in the morning. - Continue Xigduo  at night. - Monitor blood glucose daily. - Recheck A1c in 3 months. - Consider reducing Lantus  if A1c improves, which may also make weight loss easier.  Orders: -     Comprehensive metabolic panel with GFR; Future -     VITAMIN D 25 Hydroxy (Vit-D Deficiency, Fractures); Future -     POCT glycosylated hemoglobin (Hb A1C)  Paroxysmal atrial fibrillation (HCC) Assessment & Plan: Rate controlled on nebivolol  and rhythm controlled with fleccanide. Anticoagulated with Eliquis  5 mg BID. Follows with cardiology and is scheduled for 2nd cardiac ablation in a few weeks.  Orders: -     TSH; Future  Hyperlipidemia associated with type 2 diabetes mellitus (HCC) Assessment & Plan: Last lipid panel: LDL 59, HDL 49, Trig 197. The 10-year ASCVD risk score (Arnett DK, et al., 2019) is: 2.8% Continue rosuvastatin  10 mg daily. Collecting new lipid panel and CMP today. Will cont to monitor  Orders: -     Comprehensive metabolic panel with GFR; Future -     Lipid panel; Future -     VITAMIN D 25 Hydroxy (Vit-D Deficiency, Fractures); Future  Morbid obesity (HCC) Assessment & Plan: Weight down to 187, BMI 35.  Discussed the possibility of adding Wellbutrin to her medication regimen for mood boost as well as modest weight loss. Advised her to speak with her psychiatrist about this.  GLP-1 therapy  contraindicated due to history of pancreatitis 3 months ago   Bipolar affective disorder, remission status unspecified (HCC) Assessment & Plan: Worsening depression. Considering Wellbutrin. No active suicidal ideation. - Discuss Wellbutrin with psychiatrist for mood and weight loss benefits. - Provide information on mental health urgent care in [facility] for emergency support.  - Continue Cymbalta  120 mg and Caplyta as prescribed by psychiatrist.         Return in about 3 months (around 02/23/2024) for DM, HLD, mood.  Saddie JULIANNA Sacks, PA-C

## 2023-11-23 NOTE — Assessment & Plan Note (Signed)
 Last lipid panel: LDL 59, HDL 49, Trig 197. The 10-year ASCVD risk score (Arnett DK, et al., 2019) is: 2.8% Continue rosuvastatin  10 mg daily. Collecting new lipid panel and CMP today. Will cont to monitor

## 2023-11-23 NOTE — Assessment & Plan Note (Signed)
 Worsening depression. Considering Wellbutrin. No active suicidal ideation. - Discuss Wellbutrin with psychiatrist for mood and weight loss benefits. - Provide information on mental health urgent care in [facility] for emergency support.  - Continue Cymbalta  120 mg and Caplyta as prescribed by psychiatrist.

## 2023-11-23 NOTE — Assessment & Plan Note (Signed)
 A1c improved to 7.1%.  Mounjaro contraindicated due to pancreatitis. - Continue 30 units of Lantus  in the morning. - Continue Xigduo  at night. - Monitor blood glucose daily. - Recheck A1c in 3 months. - Consider reducing Lantus  if A1c improves, which may also make weight loss easier.

## 2023-11-23 NOTE — Assessment & Plan Note (Signed)
 Rate controlled on nebivolol  and rhythm controlled with fleccanide. Anticoagulated with Eliquis  5 mg BID. Follows with cardiology and is scheduled for 2nd cardiac ablation in a few weeks.

## 2023-11-23 NOTE — Assessment & Plan Note (Signed)
 Weight down to 187, BMI 35.  Discussed the possibility of adding Wellbutrin to her medication regimen for mood boost as well as modest weight loss. Advised her to speak with her psychiatrist about this.  GLP-1 therapy contraindicated due to history of pancreatitis 3 months ago

## 2023-11-24 ENCOUNTER — Ambulatory Visit: Payer: Self-pay

## 2023-11-25 LAB — COMPREHENSIVE METABOLIC PANEL WITH GFR
ALT: 30 IU/L (ref 0–32)
AST: 30 IU/L (ref 0–40)
Albumin: 4.6 g/dL (ref 3.8–4.9)
Alkaline Phosphatase: 121 IU/L (ref 49–135)
BUN/Creatinine Ratio: 16 (ref 9–23)
BUN: 11 mg/dL (ref 6–24)
Bilirubin Total: 0.4 mg/dL (ref 0.0–1.2)
CO2: 21 mmol/L (ref 20–29)
Calcium: 9.8 mg/dL (ref 8.7–10.2)
Chloride: 100 mmol/L (ref 96–106)
Creatinine, Ser: 0.68 mg/dL (ref 0.57–1.00)
Globulin, Total: 2.5 g/dL (ref 1.5–4.5)
Glucose: 121 mg/dL — ABNORMAL HIGH (ref 70–99)
Potassium: 4.3 mmol/L (ref 3.5–5.2)
Sodium: 136 mmol/L (ref 134–144)
Total Protein: 7.1 g/dL (ref 6.0–8.5)
eGFR: 102 mL/min/1.73 (ref >59)

## 2023-11-25 LAB — LIPID PANEL
Chol/HDL Ratio: 3.3 ratio (ref 0.0–4.4)
Cholesterol, Total: 133 mg/dL (ref 100–199)
HDL: 40 mg/dL (ref >39)
LDL Chol Calc (NIH): 61 mg/dL (ref 0–99)
Triglycerides: 193 mg/dL — ABNORMAL HIGH (ref 0–149)
VLDL Cholesterol Cal: 32 mg/dL (ref 5–40)

## 2023-11-25 LAB — VITAMIN D 25 HYDROXY (VIT D DEFICIENCY, FRACTURES): Vit D, 25-Hydroxy: 27 ng/mL — ABNORMAL LOW (ref 30.0–100.0)

## 2023-11-25 LAB — TSH: TSH: 1.05 u[IU]/mL (ref 0.450–4.500)

## 2023-12-01 NOTE — Pre-Procedure Instructions (Signed)
 Instructed patient on the following items: Arrival time 0515 Nothing to eat or drink after midnight No meds AM of procedure Responsible person to drive you home and stay with you for 24 hrs  Have you missed any doses of anti-coagulant Eliquis- takes twice a day, hasn't missed any doses in last 4 weeks.  Don't take dose morning of procedure.

## 2023-12-01 NOTE — Anesthesia Preprocedure Evaluation (Addendum)
 Anesthesia Evaluation  Patient identified by MRN, date of birth, ID band Patient awake    Reviewed: Allergy & Precautions, H&P , NPO status , Patient's Chart, lab work & pertinent test results  Airway Mallampati: II  TM Distance: >3 FB Neck ROM: Full    Dental no notable dental hx. (+) Teeth Intact, Dental Advisory Given   Pulmonary neg pulmonary ROS   Pulmonary exam normal breath sounds clear to auscultation       Cardiovascular Exercise Tolerance: Good hypertension, Pt. on medications and Pt. on home beta blockers + dysrhythmias Atrial Fibrillation  Rhythm:Regular Rate:Normal     Neuro/Psych  Headaches  Anxiety Depression Bipolar Disorder      GI/Hepatic Neg liver ROS,GERD  Medicated,,  Endo/Other  diabetes, Insulin  Dependent  Class 3 obesity  Renal/GU negative Renal ROS  negative genitourinary   Musculoskeletal   Abdominal   Peds  Hematology negative hematology ROS (+)   Anesthesia Other Findings   Reproductive/Obstetrics negative OB ROS                              Anesthesia Physical Anesthesia Plan  ASA: 3  Anesthesia Plan: General   Post-op Pain Management: Tylenol  PO (pre-op)*   Induction: Intravenous  PONV Risk Score and Plan: 4 or greater and Ondansetron , Dexamethasone  and Midazolam   Airway Management Planned: Oral ETT  Additional Equipment:   Intra-op Plan:   Post-operative Plan: Extubation in OR  Informed Consent: I have reviewed the patients History and Physical, chart, labs and discussed the procedure including the risks, benefits and alternatives for the proposed anesthesia with the patient or authorized representative who has indicated his/her understanding and acceptance.     Dental advisory given  Plan Discussed with: CRNA  Anesthesia Plan Comments:          Anesthesia Quick Evaluation

## 2023-12-02 ENCOUNTER — Ambulatory Visit (HOSPITAL_COMMUNITY)
Admission: RE | Admit: 2023-12-02 | Discharge: 2023-12-02 | Disposition: A | Discharge status: Home / Self Care | Attending: Cardiovascular Disease | Admitting: Cardiovascular Disease

## 2023-12-02 ENCOUNTER — Ambulatory Visit (HOSPITAL_COMMUNITY)
Admission: RE | Disposition: A | Discharge status: Home / Self Care | Payer: Self-pay | Source: Home / Self Care | Attending: Cardiovascular Disease

## 2023-12-02 ENCOUNTER — Ambulatory Visit (HOSPITAL_COMMUNITY): Payer: Self-pay | Admitting: Anesthesiology

## 2023-12-02 ENCOUNTER — Other Ambulatory Visit: Payer: Self-pay

## 2023-12-02 DIAGNOSIS — Z7901 Long term (current) use of anticoagulants: Secondary | ICD-10-CM | POA: Diagnosis not present

## 2023-12-02 DIAGNOSIS — Z79899 Other long term (current) drug therapy: Secondary | ICD-10-CM | POA: Diagnosis not present

## 2023-12-02 DIAGNOSIS — Z6834 Body mass index (BMI) 34.0-34.9, adult: Secondary | ICD-10-CM | POA: Insufficient documentation

## 2023-12-02 DIAGNOSIS — E119 Type 2 diabetes mellitus without complications: Secondary | ICD-10-CM | POA: Diagnosis not present

## 2023-12-02 DIAGNOSIS — Z794 Long term (current) use of insulin: Secondary | ICD-10-CM | POA: Insufficient documentation

## 2023-12-02 DIAGNOSIS — I480 Paroxysmal atrial fibrillation: Secondary | ICD-10-CM | POA: Diagnosis present

## 2023-12-02 DIAGNOSIS — E66813 Obesity, class 3: Secondary | ICD-10-CM | POA: Diagnosis not present

## 2023-12-02 DIAGNOSIS — I10 Essential (primary) hypertension: Secondary | ICD-10-CM | POA: Insufficient documentation

## 2023-12-02 DIAGNOSIS — D6869 Other thrombophilia: Secondary | ICD-10-CM | POA: Diagnosis not present

## 2023-12-02 DIAGNOSIS — K219 Gastro-esophageal reflux disease without esophagitis: Secondary | ICD-10-CM | POA: Diagnosis not present

## 2023-12-02 DIAGNOSIS — I4892 Unspecified atrial flutter: Secondary | ICD-10-CM | POA: Insufficient documentation

## 2023-12-02 DIAGNOSIS — F319 Bipolar disorder, unspecified: Secondary | ICD-10-CM | POA: Diagnosis not present

## 2023-12-02 HISTORY — PX: ATRIAL FIBRILLATION ABLATION: EP1191

## 2023-12-02 LAB — GLUCOSE, CAPILLARY: Glucose-Capillary: 168 mg/dL — ABNORMAL HIGH (ref 70–99)

## 2023-12-02 SURGERY — ATRIAL FIBRILLATION ABLATION
Anesthesia: General

## 2023-12-02 MED ORDER — SODIUM CHLORIDE 0.9 % IV SOLN: INTRAVENOUS | Status: DC

## 2023-12-02 MED ORDER — DEXAMETHASONE SOD PHOSPHATE PF 10 MG/ML IJ SOLN
INTRAMUSCULAR | Status: DC | PRN
  Administered 2023-12-02: 5 mg via INTRAVENOUS

## 2023-12-02 MED ORDER — HEPARIN (PORCINE) IN NACL 1000-0.9 UT/500ML-% IV SOLN
INTRAVENOUS | Status: DC | PRN
  Administered 2023-12-02 (×3): 500 mL

## 2023-12-02 MED ORDER — PROTAMINE SULFATE 10 MG/ML IV SOLN
INTRAVENOUS | Status: DC | PRN
  Administered 2023-12-02: 20 mg via INTRAVENOUS
  Administered 2023-12-02 (×3): 10 mg via INTRAVENOUS

## 2023-12-02 MED ORDER — MIDAZOLAM HCL 2 MG/2ML IJ SOLN
INTRAMUSCULAR | Status: AC
  Filled 2023-12-02: qty 2

## 2023-12-02 MED ORDER — SODIUM CHLORIDE 0.9% FLUSH: 3.0000 mL | INTRAVENOUS | Status: DC | PRN

## 2023-12-02 MED ORDER — HEPARIN SODIUM (PORCINE) 1000 UNIT/ML IJ SOLN
INTRAMUSCULAR | Status: DC | PRN
  Administered 2023-12-02: 14000 [IU] via INTRAVENOUS

## 2023-12-02 MED ORDER — SODIUM CHLORIDE 0.9% FLUSH: 3.0000 mL | Freq: Two times a day (BID) | INTRAVENOUS | Status: DC

## 2023-12-02 MED ORDER — ACETAMINOPHEN 325 MG PO TABS: 650.0000 mg | ORAL_TABLET | ORAL | Status: DC | PRN

## 2023-12-02 MED ORDER — ONDANSETRON HCL 4 MG/2ML IJ SOLN: 4.0000 mg | Freq: Four times a day (QID) | INTRAMUSCULAR | Status: DC | PRN

## 2023-12-02 MED ORDER — ROCURONIUM BROMIDE 10 MG/ML (PF) SYRINGE
PREFILLED_SYRINGE | INTRAVENOUS | Status: DC | PRN
  Administered 2023-12-02: 50 mg via INTRAVENOUS

## 2023-12-02 MED ORDER — SODIUM CHLORIDE 0.9 % IV SOLN: 250.0000 mL | INTRAVENOUS | Status: DC | PRN

## 2023-12-02 MED ORDER — ONDANSETRON HCL 4 MG/2ML IJ SOLN
INTRAMUSCULAR | Status: DC | PRN
  Administered 2023-12-02: 4 mg via INTRAVENOUS

## 2023-12-02 MED ORDER — MIDAZOLAM HCL (PF) 2 MG/2ML IJ SOLN
INTRAMUSCULAR | Status: DC | PRN
  Administered 2023-12-02: 1 mg via INTRAVENOUS

## 2023-12-02 MED ORDER — ATROPINE SULFATE 1 MG/10ML IJ SOSY
PREFILLED_SYRINGE | INTRAMUSCULAR | Status: AC
  Filled 2023-12-02: qty 10

## 2023-12-02 MED ORDER — FENTANYL CITRATE (PF) 100 MCG/2ML IJ SOLN
INTRAMUSCULAR | Status: AC
  Filled 2023-12-02: qty 2

## 2023-12-02 MED ORDER — FENTANYL CITRATE (PF) 100 MCG/2ML IJ SOLN
INTRAMUSCULAR | Status: DC | PRN
  Administered 2023-12-02: 50 ug via INTRAVENOUS

## 2023-12-02 MED ORDER — LIDOCAINE 2% (20 MG/ML) 5 ML SYRINGE
INTRAMUSCULAR | Status: DC | PRN
  Administered 2023-12-02: 60 mg via INTRAVENOUS

## 2023-12-02 MED ADMIN — PROPOFOL 200 MG/20ML IV EMUL: 150 mg | INTRAVENOUS | NDC 00069020910

## 2023-12-02 MED ADMIN — Acetaminophen Tab 500 MG: 1000 mg | ORAL | NDC 50580045711

## 2023-12-02 MED ADMIN — Sugammadex Sodium IV 200 MG/2ML (Base Equivalent): 120 mg | INTRAVENOUS | NDC 99999070036

## 2023-12-02 MED ADMIN — Sugammadex Sodium IV 200 MG/2ML (Base Equivalent): 200 mg | INTRAVENOUS | NDC 99999070036

## 2023-12-02 MED FILL — Acetaminophen Tab 500 MG: 1000.0000 mg | ORAL | Qty: 2 | Status: AC

## 2023-12-02 SURGICAL SUPPLY — 17 items
BLANKET WARM UNDERBOD FULL ACC (MISCELLANEOUS) ×1 IMPLANT
CATH GE 8FR SOUNDSTAR (CATHETERS) IMPLANT
CATH OCTARAY 2.0 F 3-3-3-3-3 (CATHETERS) IMPLANT
CATH WEBSTER BI DIR CS D-F CRV (CATHETERS) IMPLANT
CLOSURE PERCLOSE PROSTYLE (Vascular Products) IMPLANT
COVER SWIFTLINK CONNECTOR (BAG) ×1 IMPLANT
DEVICE CLOSURE MYNXGRIP 6/7F (Vascular Products) IMPLANT
DILATOR VESSEL 38 20CM 16FR (INTRODUCER) IMPLANT
GUIDEWIRE INQWIRE 1.5J.035X260 (WIRE) IMPLANT
KIT VERSACROSS CNCT FARADRIVE (KITS) IMPLANT
PACK EP LF (CUSTOM PROCEDURE TRAY) ×1 IMPLANT
PAD DEFIB RADIO PHYSIO CONN (PAD) ×1 IMPLANT
PATCH CARTO3 (PAD) IMPLANT
SHEATH AVANTI 11CM 9FR (SHEATH) IMPLANT
SHEATH FARADRIVE STEERABLE (SHEATH) IMPLANT
SHEATH PINNACLE 8F 10CM (SHEATH) IMPLANT
SHEATH PROBE COVER 6X72 (BAG) IMPLANT

## 2023-12-02 NOTE — Progress Notes (Signed)
 Up and walked and tolerated well; bilat groins stable, no bleeding or hematoma

## 2023-12-02 NOTE — Discharge Instructions (Signed)

## 2023-12-02 NOTE — Transfer of Care (Signed)
 Immediate Anesthesia Transfer of Care Note  Patient: Morgan [name]  Procedure(s) Performed: ATRIAL FIBRILLATION ABLATION  Patient Location: PACU  Anesthesia Type:General  Level of Consciousness: awake, alert , and oriented  Airway & Oxygen Therapy: Patient Spontanous Breathing and Patient connected to nasal cannula oxygen  Post-op Assessment: Report given to RN and Post -op Vital signs reviewed and stable  Post vital signs: Reviewed and stable  Last Vitals:  Vitals Value Taken Time  BP    Temp    Pulse    Resp    SpO2      Last Pain:  Vitals:   12/02/23 0603  TempSrc: Oral  PainSc: 0-No pain         Complications: There were no known notable events for this encounter.

## 2023-12-02 NOTE — Anesthesia Procedure Notes (Signed)
 Procedure Name: Intubation Date/Time: 12/02/2023 7:52 AM  Performed by: Worth Catherene Flores, CRNAPre-anesthesia Checklist: Patient identified, Emergency Drugs available, Suction available and Patient being monitored Patient Re-evaluated:Patient Re-evaluated prior to induction Oxygen Delivery Method: Circle system utilized Preoxygenation: Pre-oxygenation with 100% oxygen Induction Type: IV induction Ventilation: Mask ventilation without difficulty Laryngoscope Size: Mac and 4 Grade View: Grade II Tube type: Oral Tube size: 7.0 mm Number of attempts: 1 Airway Equipment and Method: Stylet and Oral airway Placement Confirmation: ETT inserted through vocal cords under direct vision, positive ETCO2 and breath sounds checked- equal and bilateral Secured at: 22 cm Tube secured with: Tape Dental Injury: Teeth and Oropharynx as per pre-operative assessment

## 2023-12-02 NOTE — Anesthesia Postprocedure Evaluation (Signed)
 Anesthesia Post Note  Patient: Morgan [name]  Procedure(s) Performed: ATRIAL FIBRILLATION ABLATION     Patient location during evaluation: Cath Lab Anesthesia Type: General Level of consciousness: awake and alert Pain management: pain level controlled Vital Signs Assessment: post-procedure vital signs reviewed and stable Respiratory status: spontaneous breathing, nonlabored ventilation and respiratory function stable Cardiovascular status: blood pressure returned to baseline and stable Postop Assessment: no apparent nausea or vomiting Anesthetic complications: no   There were no known notable events for this encounter.  Last Vitals:  Vitals:   12/02/23 0920 12/02/23 0925  BP: 119/68 132/64  Pulse: 62 64  Resp: 17 19  Temp:  36.5 C  SpO2: 94% 95%    Last Pain:  Vitals:   12/02/23 0925  TempSrc: Temporal  PainSc:                  [name],W. EDMOND

## 2023-12-02 NOTE — H&P (Signed)
 Electrophysiology Office Note:    Date:  12/02/2023   ID:  Morgan [name], DOB [birth year], MRN [id number]  PCP:  Gayle Saddie [name]   [facility] Providers Cardiologist:  None Electrophysiologist:  Eulas FORBES Furbish, MD     Referring MD: No ref. provider found   History of Present Illness:    Morgan [name] is a 58 y.o. female with a medical history significant for HTN atrial flutter and fibrillation DM, referred for management of atrial fibrillation flutter.     She is a former patient of Dr. Kelsie.  ZIO monitor from May 2022 showed atrial fibrillation with a burden of less than 1% and a more regular narrow complex tachycardia thought to be atrial flutter.  There were also episodes of PVCs and bigeminy.  Her symptom episodes did not correlate with atrial fibrillation, however.  She was managed with diltiazem  but this was discontinued in March 2024 due to hypotension and bradycardia.  She was on nebivolol  but readmitted with A-fib with RVR.  She had increasingly frequent episodes of palpitations.  A monitor was placed that showed sinus rhythm and symptom episodes that correlated with sinus rhythm.  She presented to the ER in January 2 with headache and palpitations.  She had missed her nebivolol  and Eliquis  that a.m. ECG showed atrial fibrillation with RVR.  She spontaneously converted to sinus rhythm during the ER stay.  She underwent an uncomplicated ablation for atrial fibrillation on May 16, 2023 with ablation of the posterior wall and pulmonary veins.  She reports that her A-fib acceptable but worse after the ablation for a time period.  Flecainide  was started.  She wore a heart monitor in August 2025 that showed less than 1% burden of atrial fibrillation, longest duration was approximately 8 minutes.  She is quite symptomatic with episodes.      Today, she reports that she is well currently.  I reviewed the patient'slabs. she  has not missed any doses of  anticoagulation, and she took her dose last night. There have been no changes in the patient's diagnoses, medications, or condition since our recent clinic visit.   EKGs/Labs/Other Studies Reviewed Today:     Echocardiogram:  TTE 04/28/2022 EF 60-65%. Normal biatrial size.   Monitors:  14-day ZIO monitor July 2025 Predominant rhythm: Sinus Sinus HR: 50 - 123 bpm, AVG 72 bpm   < 1% atrial ectopy < 1% ventricular ectopy   Arrhythmia detected:  Atrial fibrillation: < 1% burden, rates not well controlled, 71-198 bpm, 124 bpm avg. Longest episode was less than 8 minutes.   Patient triggered events: were associated with atrial fibrillation and also with normal sinus rhythm    Zio monitor 13 days 07/2022 -- my interpretation Sinus rhythm heart rate 65-152 beats minute, average 100 bpm Less than 1% burden of atrial fibrillation with average rate of 144 bpm Episodes labeled SVT appear to be flutter or a very rapid atrial tachycardia Multiple symptom episodes correlated with sinus tachycardia, sinus rhythm PVCs and bigeminy.  The AF episode was not correlated with symptoms.  Zio monitor 14 days -- 03/14/2022 Sinus rhythm: HR 45 - 122, average 69 bpm. No atrial fibrillation detected. Rare supraventricular ectopy. Rare ventricular ectopy. No sustained arrhythmias. Symptom trigger episodes correspond to sinus rhythm    EKG:         Physical Exam:    VS:  BP 129/68   Pulse 61   Temp 97.9 F (36.6 C) (Oral)   Resp 16  Ht 5' 1 (1.549 m)   Wt 82.6 kg   LMP 05/12/2011   SpO2 97%   BMI 34.39 kg/m     Wt Readings from Last 3 Encounters:  12/02/23 82.6 kg  11/23/23 84.9 kg  11/07/23 87.2 kg     GEN: Well nourished, well developed in no acute distress CARDIAC: RRR, no murmurs, rubs, gallops RESPIRATORY:  Normal work of breathing MUSCULOSKELETAL: no edema    ASSESSMENT & PLAN:     Palpitations She describes recent episodes as forceful beats, sometimes with slow  heart rate These correlated with sinus rhythm on her monitor  Paroxysmal atrial fibrillation History of RVR and difficult to control rates Chronic use of Abilify  limits and treatment choices ER admit January 2 with AF/RVR; S/p PFA ablation 04/2023  Monitor showing brief episodes of recurrence, lasting a few minutes, with poorly controlled rates.  She is quite symptomatic She is having recurrences despite being on flecainide . We discussed management options.  Using a shared decision making approach, we opted to schedule her for repeat mapping and possible ablation of atrial fibrillation.  We will plan to use carto.  She will not need a CT.   Secondary hypercoagulable state Continue apixaban  5 mg twice daily   Possible sleep apnea -- will order itamar sleep study   Signed, Eulas FORBES Furbish, MD  12/02/2023 7:16 AM    [facility]

## 2023-12-04 ENCOUNTER — Encounter (HOSPITAL_COMMUNITY): Payer: Self-pay | Admitting: Cardiovascular Disease

## 2023-12-05 ENCOUNTER — Telehealth (HOSPITAL_COMMUNITY): Payer: Self-pay

## 2023-12-05 MED FILL — Atropine Sulfate Soln Prefill Syr 1 MG/10ML (0.1 MG/ML): INTRAMUSCULAR | Qty: 10 | Status: AC

## 2023-12-05 NOTE — Telephone Encounter (Signed)
 Spoke with patient to complete post procedure follow up call.  Patient reports no complications with groin sites.   Instructions reviewed with patient:  It is normal to have bruising, tenderness, mild swelling, and a pea or marble sized lump/knot at the groin site which can take up to three months to resolve.  Get help right away if you notice sudden swelling at the puncture site.  Check your puncture site every day for signs of infection: fever, redness, swelling, pus drainage, warmth, foul odor or excessive pain. If this occurs, please call [phone], to speak with the RN Navigator. Get help right away if your puncture site is bleeding and the bleeding does not stop after applying firm pressure to the area.  You may continue to have skipped beats/ atrial fibrillation during the first several months after your procedure.  It is very important not to miss any doses of your blood thinner Eliquis .    You will follow up with the Afib clinic 4 weeks after your procedure and follow up with Dr. Dr. Nancey 3 months after your procedure.   Patient verbalized understanding to all instructions provided.

## 2023-12-12 ENCOUNTER — Other Ambulatory Visit (HOSPITAL_COMMUNITY): Payer: Self-pay | Admitting: Physician Assistant

## 2023-12-15 ENCOUNTER — Ambulatory Visit: Payer: Self-pay

## 2023-12-15 NOTE — Telephone Encounter (Signed)
 FYI Only or Action Required?: FYI only for provider: No appts avail, pt going to Urgent Care or ED.  Patient was last seen in primary care on 11/23/2023 by [name], PA-C.  Called Nurse Triage reporting Headache.  Symptoms began several days ago.  Interventions attempted: OTC medications: Tylenol .  Symptoms are: gradually worsening.  Triage Disposition: See HCP Within 4 Hours (Or PCP Triage)  Patient/caregiver understands and will follow disposition?: Yes  Copied from CRM #[id number]. Topic: Clinical - Red Word Triage >> Dec 15, 2023  9:02 AM Larissa RAMAN wrote: [facility] that prompted transfer to Nurse Triage: headache since Saturday with tingling sensation. Lt side of head base of skull-severe pain Reason for Disposition  [1] SEVERE headache (e.g., excruciating) AND [2] not improved after 2 hours of pain medicine  Answer Assessment - Initial Assessment Questions 1. LOCATION: Where does it hurt?      Base of skull  2. ONSET: When did the headache start? (e.g., minutes, hours, days)      Started having spells on Saturday where head would hurt, followed by a tingling sensation  3. PATTERN: Does the pain come and go, or has it been constant since it started?     Started intermittently at first, now constant  4. SEVERITY: How bad is the pain? and What does it keep you from doing?  (e.g., Scale 1-10; mild, moderate, or severe)     8/10 pain- took some Tylenol  this morning around 2am, with minimal relief  5. RECURRENT SYMPTOM: Have you ever had headaches before? If Yes, ask: When was the last time? and What happened that time?     Used to have migraines when younger 10-15 ago, treated with a shot at PCP office.   6. CAUSE: What do you think is causing the headache?     Unsure of cause  7. MIGRAINE: Have you been diagnosed with migraine headaches? If Yes, ask: Is this headache similar?      Yes had migraines in  the past, but this does not feel like a  migraine  8. HEAD INJURY: Has there been any recent injury to your head?      No  9. OTHER SYMPTOMS: Do you have any other symptoms? (e.g., fever, stiff neck, eye pain, sore throat, cold symptoms)     Tingling in the area of the headache  10. PREGNANCY: Is there any chance you are pregnant? When was your last menstrual period?       No  Protocols used: Headache-A-AH

## 2023-12-20 ENCOUNTER — Emergency Department (HOSPITAL_COMMUNITY)

## 2023-12-20 ENCOUNTER — Observation Stay (HOSPITAL_COMMUNITY)
Admission: EM | Admit: 2023-12-20 | Discharge: 2023-12-22 | Disposition: A | Discharge status: Home / Self Care | Attending: Internal Medicine | Admitting: Internal Medicine

## 2023-12-20 ENCOUNTER — Other Ambulatory Visit: Payer: Self-pay

## 2023-12-20 ENCOUNTER — Encounter (HOSPITAL_COMMUNITY): Payer: Self-pay

## 2023-12-20 DIAGNOSIS — E1165 Type 2 diabetes mellitus with hyperglycemia: Secondary | ICD-10-CM | POA: Insufficient documentation

## 2023-12-20 DIAGNOSIS — R202 Paresthesia of skin: Secondary | ICD-10-CM | POA: Insufficient documentation

## 2023-12-20 DIAGNOSIS — F32A Depression, unspecified: Secondary | ICD-10-CM

## 2023-12-20 DIAGNOSIS — I10 Essential (primary) hypertension: Secondary | ICD-10-CM | POA: Diagnosis not present

## 2023-12-20 DIAGNOSIS — I480 Paroxysmal atrial fibrillation: Secondary | ICD-10-CM | POA: Diagnosis not present

## 2023-12-20 DIAGNOSIS — G43909 Migraine, unspecified, not intractable, without status migrainosus: Principal | ICD-10-CM

## 2023-12-20 DIAGNOSIS — Z6835 Body mass index (BMI) 35.0-35.9, adult: Secondary | ICD-10-CM | POA: Diagnosis not present

## 2023-12-20 DIAGNOSIS — F319 Bipolar disorder, unspecified: Secondary | ICD-10-CM | POA: Diagnosis present

## 2023-12-20 DIAGNOSIS — G43809 Other migraine, not intractable, without status migrainosus: Principal | ICD-10-CM | POA: Insufficient documentation

## 2023-12-20 DIAGNOSIS — R29818 Other symptoms and signs involving the nervous system: Principal | ICD-10-CM | POA: Diagnosis present

## 2023-12-20 DIAGNOSIS — F419 Anxiety disorder, unspecified: Secondary | ICD-10-CM | POA: Insufficient documentation

## 2023-12-20 DIAGNOSIS — E785 Hyperlipidemia, unspecified: Secondary | ICD-10-CM | POA: Diagnosis not present

## 2023-12-20 DIAGNOSIS — R519 Headache, unspecified: Secondary | ICD-10-CM | POA: Diagnosis present

## 2023-12-20 DIAGNOSIS — G894 Chronic pain syndrome: Secondary | ICD-10-CM

## 2023-12-20 DIAGNOSIS — R29810 Facial weakness: Secondary | ICD-10-CM | POA: Insufficient documentation

## 2023-12-20 DIAGNOSIS — R200 Anesthesia of skin: Secondary | ICD-10-CM

## 2023-12-20 DIAGNOSIS — E66812 Obesity, class 2: Secondary | ICD-10-CM

## 2023-12-20 DIAGNOSIS — M5416 Radiculopathy, lumbar region: Secondary | ICD-10-CM | POA: Diagnosis not present

## 2023-12-20 DIAGNOSIS — Z794 Long term (current) use of insulin: Secondary | ICD-10-CM | POA: Insufficient documentation

## 2023-12-20 DIAGNOSIS — E119 Type 2 diabetes mellitus without complications: Secondary | ICD-10-CM

## 2023-12-20 LAB — CBC
HCT: 43.8 % (ref 36.0–46.0)
Hemoglobin: 13.2 g/dL (ref 12.0–15.0)
MCH: 26 pg (ref 26.0–34.0)
MCHC: 30.1 g/dL (ref 30.0–36.0)
MCV: 86.2 fL (ref 80.0–100.0)
Platelets: 254 K/uL (ref 150–400)
RBC: 5.08 MIL/uL (ref 3.87–5.11)
RDW: 14.8 % (ref 11.5–15.5)
WBC: 7.4 K/uL (ref 4.0–10.5)
nRBC: 0 % (ref 0.0–0.2)

## 2023-12-20 LAB — I-STAT CHEM 8, ED
BUN: 12 mg/dL (ref 6–20)
Calcium, Ion: 1.15 mmol/L (ref 1.15–1.40)
Chloride: 103 mmol/L (ref 98–111)
Creatinine, Ser: 0.6 mg/dL (ref 0.44–1.00)
Glucose, Bld: 173 mg/dL — ABNORMAL HIGH (ref 70–99)
HCT: 35 % — ABNORMAL LOW (ref 36.0–46.0)
Hemoglobin: 11.9 g/dL — ABNORMAL LOW (ref 12.0–15.0)
Potassium: 4.6 mmol/L (ref 3.5–5.1)
Sodium: 136 mmol/L (ref 135–145)
TCO2: 27 mmol/L (ref 22–32)

## 2023-12-20 LAB — ETHANOL: Alcohol, Ethyl (B): <15 mg/dL (ref <15)

## 2023-12-20 LAB — DIFFERENTIAL
Abs Immature Granulocytes: 0.02 K/uL (ref 0.00–0.07)
Basophils Absolute: 0.1 K/uL (ref 0.0–0.1)
Basophils Relative: 1 %
Eosinophils Absolute: 0.2 K/uL (ref 0.0–0.5)
Eosinophils Relative: 3 %
Immature Granulocytes: 0 %
Lymphocytes Relative: 26 %
Lymphs Abs: 2 K/uL (ref 0.7–4.0)
Monocytes Absolute: 0.4 K/uL (ref 0.1–1.0)
Monocytes Relative: 5 %
Neutro Abs: 4.8 K/uL (ref 1.7–7.7)
Neutrophils Relative %: 65 %

## 2023-12-20 LAB — CBG MONITORING, ED: Glucose-Capillary: 195 mg/dL — ABNORMAL HIGH (ref 70–99)

## 2023-12-20 MED ORDER — SENNOSIDES-DOCUSATE SODIUM 8.6-50 MG PO TABS: 1.0000 | ORAL_TABLET | Freq: Every evening | ORAL | Status: DC | PRN

## 2023-12-20 MED ORDER — TIZANIDINE HCL 4 MG PO TABS
4.0000 mg | ORAL_TABLET | Freq: Every day | ORAL | Status: DC
  Administered 2023-12-21 (×2): 4 mg via ORAL
  Filled 2023-12-20 (×2): qty 1

## 2023-12-20 MED ORDER — KETOROLAC TROMETHAMINE 30 MG/ML IJ SOLN
30.0000 mg | Freq: Once | INTRAMUSCULAR | Status: AC
  Administered 2023-12-21: 30 mg via INTRAVENOUS
  Filled 2023-12-20: qty 1

## 2023-12-20 MED ORDER — ACETAMINOPHEN 160 MG/5ML PO SOLN: 650.0000 mg | ORAL | Status: DC | PRN

## 2023-12-20 MED ORDER — ACETAMINOPHEN 500 MG PO TABS
1000.0000 mg | ORAL_TABLET | Freq: Every day | ORAL | Status: DC
  Administered 2023-12-21 (×2): 1000 mg via ORAL
  Filled 2023-12-20 (×2): qty 2

## 2023-12-20 MED ORDER — DIPHENHYDRAMINE HCL 25 MG PO CAPS
50.0000 mg | ORAL_CAPSULE | Freq: Every day | ORAL | Status: DC
  Administered 2023-12-21 (×2): 50 mg via ORAL
  Filled 2023-12-20 (×2): qty 2

## 2023-12-20 MED ORDER — ACETAMINOPHEN 325 MG PO TABS: 650.0000 mg | ORAL_TABLET | ORAL | Status: DC | PRN

## 2023-12-20 MED ORDER — INSULIN ASPART 100 UNIT/ML IJ SOLN
0.0000 [IU] | Freq: Three times a day (TID) | INTRAMUSCULAR | Status: DC
  Administered 2023-12-21: 3 [IU] via SUBCUTANEOUS
  Administered 2023-12-22: 2 [IU] via SUBCUTANEOUS
  Filled 2023-12-20: qty 0.15
  Filled 2023-12-20: qty 2

## 2023-12-20 MED ORDER — ROSUVASTATIN CALCIUM 10 MG PO TABS
10.0000 mg | ORAL_TABLET | Freq: Every day | ORAL | Status: DC
  Administered 2023-12-21 (×2): 10 mg via ORAL
  Filled 2023-12-20 (×2): qty 1

## 2023-12-20 MED ORDER — INSULIN ASPART 100 UNIT/ML IJ SOLN
0.0000 [IU] | Freq: Every day | INTRAMUSCULAR | Status: DC
  Filled 2023-12-20: qty 2
  Filled 2023-12-20: qty 0.05

## 2023-12-20 MED ORDER — METHYLPREDNISOLONE SODIUM SUCC 125 MG IJ SOLR
125.0000 mg | Freq: Once | INTRAMUSCULAR | Status: AC
  Administered 2023-12-20: 125 mg via INTRAVENOUS
  Filled 2023-12-20: qty 2

## 2023-12-20 MED ORDER — METOCLOPRAMIDE HCL 5 MG/ML IJ SOLN
5.0000 mg | Freq: Once | INTRAMUSCULAR | Status: AC
  Administered 2023-12-21: 5 mg via INTRAVENOUS
  Filled 2023-12-20: qty 2

## 2023-12-20 MED ORDER — NEBIVOLOL HCL 5 MG PO TABS
5.0000 mg | ORAL_TABLET | Freq: Every day | ORAL | Status: DC
  Administered 2023-12-21 (×2): 5 mg via ORAL
  Filled 2023-12-20 (×2): qty 1

## 2023-12-20 MED ORDER — DULOXETINE HCL 60 MG PO CPEP
120.0000 mg | ORAL_CAPSULE | Freq: Every day | ORAL | Status: DC
  Administered 2023-12-21 (×2): 120 mg via ORAL
  Filled 2023-12-20 (×2): qty 2

## 2023-12-20 MED ORDER — LEVETIRACETAM (KEPPRA) 500 MG/5 ML ADULT IV PUSH
1000.0000 mg | Freq: Once | INTRAVENOUS | Status: AC
  Administered 2023-12-20: 1000 mg via INTRAVENOUS
  Filled 2023-12-20: qty 10

## 2023-12-20 MED ORDER — MAGNESIUM SULFATE 2 GM/50ML IV SOLN
2.0000 g | Freq: Once | INTRAVENOUS | Status: AC
  Administered 2023-12-20: 2 g via INTRAVENOUS
  Filled 2023-12-20: qty 50

## 2023-12-20 MED ORDER — DIPHENHYDRAMINE-APAP (SLEEP) 25-500 MG PO TABS: 3.0000 | ORAL_TABLET | Freq: Every day | ORAL | Status: DC

## 2023-12-20 MED ORDER — DAPAGLIFLOZIN PROPANEDIOL 10 MG PO TABS
10.0000 mg | ORAL_TABLET | Freq: Every day | ORAL | Status: DC
Start: 2023-12-20 — End: 2023-12-22
  Administered 2023-12-21 (×2): 10 mg via ORAL
  Filled 2023-12-20 (×2): qty 1

## 2023-12-20 MED ORDER — ACETAMINOPHEN 650 MG RE SUPP: 650.0000 mg | RECTAL | Status: DC | PRN

## 2023-12-20 MED ORDER — SODIUM CHLORIDE 0.9 % IV BOLUS
1000.0000 mL | Freq: Once | INTRAVENOUS | Status: AC
  Administered 2023-12-21: 1000 mL via INTRAVENOUS

## 2023-12-20 MED ORDER — ENOXAPARIN SODIUM 40 MG/0.4ML IJ SOSY
40.0000 mg | PREFILLED_SYRINGE | INTRAMUSCULAR | Status: DC
  Administered 2023-12-21: 40 mg via SUBCUTANEOUS
  Filled 2023-12-20: qty 0.4

## 2023-12-20 MED ORDER — VITAMIN D 25 MCG (1000 UNIT) PO TABS
4000.0000 [IU] | ORAL_TABLET | Freq: Every day | ORAL | Status: DC
  Administered 2023-12-21 – 2023-12-22 (×2): 4000 [IU] via ORAL
  Filled 2023-12-20 (×2): qty 4

## 2023-12-20 MED ORDER — DAPAGLIFLOZIN PRO-METFORMIN ER 10-1000 MG PO TB24: ORAL_TABLET | Freq: Every day | ORAL | Status: DC

## 2023-12-20 MED ORDER — INSULIN GLARGINE-YFGN 100 UNIT/ML ~~LOC~~ SOLN
30.0000 [IU] | Freq: Every day | SUBCUTANEOUS | Status: DC
  Administered 2023-12-21 – 2023-12-22 (×2): 30 [IU] via SUBCUTANEOUS
  Filled 2023-12-20 (×2): qty 0.3

## 2023-12-20 MED ORDER — FLECAINIDE ACETATE 50 MG PO TABS
50.0000 mg | ORAL_TABLET | Freq: Two times a day (BID) | ORAL | Status: DC
  Administered 2023-12-21 – 2023-12-22 (×4): 50 mg via ORAL
  Filled 2023-12-20 (×4): qty 1

## 2023-12-20 MED ORDER — METFORMIN HCL ER 500 MG PO TB24: 1000.0000 mg | ORAL_TABLET | Freq: Every day | ORAL | Status: DC

## 2023-12-20 MED ORDER — SODIUM CHLORIDE 0.9 % IV SOLN: INTRAVENOUS | Status: AC

## 2023-12-20 NOTE — ED Notes (Signed)
 Tele neuro called

## 2023-12-20 NOTE — Consult Note (Signed)
 [facility] Tele[facility]urology Consult Services   Patient Name:   Morgan [name], Morgan [name] Date of Birth:   10-29-1965 Identification Number:   MRN - [id number] Date of Service:   12/20/2023 19:27:22  Diagnosis:       R51.9 - Headache, unspecified       R20.2 - Paresthesia of skin  Impression:      58 year old female with history of Afib and migraines presenting with waxing and waning headache for 2 weeks, worse since Friday, along with left face and arm numbness starting at 1730. CTH was negative for acute process. The patient was not an IV thrombolysis candidate due to being on Eliquis  and non-disabling symptoms/deficits. The differential diagnosis includes migrainous process, TIA/ischemic stroke, toxic/metabolic/infectious process, or other structural CNS process. Recommend admission for further monitoring, evaluation, and management.  Our recommendations are outlined below.  Recommendations:        Stroke/Telemetry Floor       Neuro Checks (Q4)       Bedside Swallow Eval       DVT Prophylaxis       IV Fluids, Normal Saline       Head of Bed 30 Degrees       Euglycemia and Avoid Hyperthermia (PRN Acetaminophen )       Hold Eliquis  pending results of MRI       MRI brain without contrast       MRV head       MRA head and neck without contrast       Defer to primary team on when to restart Eliquis , depending on results of MRI       TTE without bubble study       Lipid panel, Hgb A1C       Defer toxic/metabolic/infectious workup to ED/primary team  Sign Out:       Discussed with Emergency Department Provider    ------------------------------------------------------------------------------  Advanced Imaging: Advanced Imaging Deferred because:  Advanced Imaging not obtained at this time. Reason: Syndrome not consistent with LVO   Metrics: Last Known Well: 12/20/2023 17:30:00 Dispatch Time: 12/20/2023 19:27:22 Arrival Time: 12/20/2023 19:02:00 Initial Response  Time: 12/20/2023 19:30:13 Symptoms: Headache and numbness. Initial patient interaction: 12/20/2023 19:31:00 NIHSS Assessment Completed: 12/20/2023 19:37:49 Patient is not a candidate for Thrombolytic. Thrombolytic Medical Decision: 12/20/2023 19:37:50 Patient was not deemed candidate for Thrombolytic because of following reasons: Use of NOAC in last 48 hrs. . Stroke severity too mild (non-disabling) .  CT Head: CT head unremarkable for acute infarction or hemorrhage per Radiology: no acute hemorrhage or other acute abnormality I personally reviewed all the CT images that were available to me and it showed: no acute hemorrhage or other acute abnormality  Primary Provider Notified of Diagnostic Impression and Management Plan on: 12/20/2023 20:00:44    ------------------------------------------------------------------------------  History of Present Illness: Patient is a 58 year old Female.  Patient was brought by private transportation with symptoms of Headache and numbness. 58 year old female with history of Afib and migraines presenting with waxing and waning headache for 2 weeks, worse since Friday, along with left face and arm numbness starting at 1730. No motor deficits. She has not experienced a headache like this in many years. She is on Eliquis  for history of Afib. No prior strokes. She last took Eliquis  this morning at 1100.   Past Medical History:      Atrial Fibrillation      Stroke      Migraine Headaches  There is no history of Coronary Artery Disease  Medications:  Anticoagulant use:  Yes Eliquis  No Antiplatelet use Reviewed EMR for current medications  Allergies:  Reviewed  Social History: Smoking: No Alcohol Use: No Drug Use: No  Family History:  There is no family history of premature cerebrovascular disease pertinent to this consultation  ROS : 14 Points Review of Systems was performed and was negative except mentioned in HPI.  Past Surgical  History: There Is No Surgical History Contributory To Today's Visit    Examination: BP(143/91), Pulse(78), Blood Glucose(195) 1A: Level of Consciousness - Alert; keenly responsive + 0 1B: Ask Month and Age - Both Questions Right + 0 1C: Blink Eyes & Squeeze Hands - Performs Both Tasks + 0 2: Test Horizontal Extraocular Movements - Normal + 0 3: Test Visual Fields - No Visual Loss + 0 4: Test Facial Palsy (Use Grimace if Obtunded) - Normal symmetry + 0 5A: Test Left Arm Motor Drift - No Drift for 10 Seconds + 0 5B: Test Right Arm Motor Drift - No Drift for 10 Seconds + 0 6A: Test Left Leg Motor Drift - No Drift for 5 Seconds + 0 6B: Test Right Leg Motor Drift - No Drift for 5 Seconds + 0 7: Test Limb Ataxia (FNF/Heel-Shin) - No Ataxia + 0 8: Test Sensation - Mild-Moderate Loss: Less Sharp/More Dull + 1 9: Test Language/Aphasia - Normal; No aphasia + 0 10: Test Dysarthria - Normal + 0 11: Test Extinction/Inattention - No abnormality + 0  NIHSS Score: 1  NIHSS Free Text : Left face and arm decreased sensation  Pre-Morbid Modified Rankin Scale: 0 Points = No symptoms at all  Spoke with : Dr. Bari I reviewed the available imaging via Rapid and initiated discussion with the primary provider  This consult was conducted in real time using interactive audio and immunologist. Patient was informed of the technology being used for this visit and agreed to proceed. Patient located in hospital and provider located at home/office setting.   Patient is being evaluated for possible acute neurologic impairment and high probability of imminent or life-threatening deterioration. I spent total of 35 minutes providing care to this patient, including time for face to face visit via telemedicine, review of medical records, imaging studies and discussion of findings with providers, the patient and/or family.    Dr Norman Harpin   [facility] For Inpatient follow-up with [facility]  physician please call [facility] at [phone]. As we are not an outpatient service for any post hospital discharge needs please contact the hospital for assistance. If you have any questions for the [facility] physicians or need to reconsult for clinical or diagnostic changes please contact us  via [facility] at [phone].  Non-radiologist review of imaging performed to assist with emergent clinical decision-making. Remote physician workstations do not possess the same resolution, calibration, or diagnostic capabilities as hospital-based radiology reading stations, and formal radiologist read is necessary.   Signature : [place] Scheiderer

## 2023-12-20 NOTE — ED Notes (Signed)
 Code stroke cancelled per Dr MARLA Bough

## 2023-12-20 NOTE — ED Notes (Signed)
 Tele neuro on with pt

## 2023-12-20 NOTE — H&P (Signed)
 History and Physical  Morgan [name] FMW:[id number] DOB: 22-Nov-1965 DOA: 12/20/2023  PCP: [name], PA-C   Chief Complaint: Headache, numbness of the left face and LUE  HPI: Morgan [name] is a 58 y.o. female with medical history significant for paroxysmal A-fib on Eliquis , bipolar 1 disorder, anxiety and depression, migraine headache, HTN, T2DM, HLD, cervical dysplasia, chronic pain syndrome, hepatic steatosis, lumbar radiculopathy and obesity who presented to the ED for evaluation of persistent headache and new left face and arm numbness.  ED Course: Initial vitals show patient afebrile, HR 70-80, SBP 100-140s. Initial labs significant for glucose 173, otherwise normal renal function, LFTs, ethanol levels and CBC. EKG shows sinus rhythm.  CT head with no acute intracranial abnormalities. Teleneurology was consulted for evaluation.  Recommended IV Keppra 1 g, IV mag 2 g and IV Solu-Medrol  125 mg for migraine which patient received.  TRH was consulted for admission.   Review of Systems: Please see HPI for pertinent positives and negatives. A complete 10 system review of systems are otherwise negative.  Past Medical History:  Diagnosis Date   Anxiety    Atrial fibrillation (HCC)    Bipolar disorder (HCC)    Breast mass    right, removed and benign   Cancer (HCC) 09/16/2019   ovarian ca no radiation no chemo   Cervical dysplasia    Diabetes mellitus without complication (HCC)    Endometriosis    GERD (gastroesophageal reflux disease)    Hypercholesteremia    Hypertension    Migraine    Ovarian cancer (HCC)    Pelvic kidney    left   Postpartum depression    hx of   Past Surgical History:  Procedure Laterality Date   ATRIAL FIBRILLATION ABLATION N/A 05/16/2023   Procedure: ATRIAL FIBRILLATION ABLATION;  Surgeon: Nancey Eulas BRAVO, MD;  Location: [facility];  Service: Cardiovascular;  Laterality: N/A;   ATRIAL FIBRILLATION ABLATION N/A 12/02/2023   Procedure:  ATRIAL FIBRILLATION ABLATION;  Surgeon: Nancey Eulas BRAVO, MD;  Location: [facility];  Service: Cardiovascular;  Laterality: N/A;   BACK SURGERY  02/16/2012   BREAST BIOPSY Right 2016   BREAST BIOPSY Right 2020   BREAST EXCISIONAL BIOPSY Right 2021   BREAST EXCISIONAL BIOPSY Right 2016   benign   BREAST LUMPECTOMY WITH RADIOACTIVE SEED LOCALIZATION Right 10/28/2014   Procedure: RIGHT BREAST LUMPECTOMY WITH RADIOACTIVE SEED LOCALIZATION;  Surgeon: Deward Null III, MD;  Location: [facility];  Service: General;  Laterality: Right;   BREAST LUMPECTOMY WITH RADIOACTIVE SEED LOCALIZATION Right 06/18/2019   Procedure: RIGHT BREAST RADIOACTIVE SEED LOCALIZATION LUMPECTOMY;  Surgeon: Null Deward MOULD, MD;  Location: [facility];  Service: General;  Laterality: Right;   BREAST SURGERY N/A    Phreesia 07/30/2019   CESAREAN SECTION  02/15/2001   COLONOSCOPY WITH PROPOFOL  N/A 05/01/2018   Procedure: COLONOSCOPY WITH PROPOFOL ;  Surgeon: Therisa Bi, MD;  Location: [facility];  Service: Gastroenterology;  Laterality: N/A;   COLPOSCOPY     ESOPHAGOGASTRODUODENOSCOPY (EGD) WITH PROPOFOL  N/A 05/01/2018   Procedure: ESOPHAGOGASTRODUODENOSCOPY (EGD) WITH PROPOFOL ;  Surgeon: Therisa Bi, MD;  Location: [facility] END[facility]COPY;  Service: Gastroenterology;  Laterality: N/A;   ESOPHAGOGASTRODUODENOSCOPY (EGD) WITH PROPOFOL  N/A 11/13/2018   Procedure: ESOPHAGOGASTRODUODENOSCOPY (EGD) WITH PROPOFOL ;  Surgeon: Therisa Bi, MD;  Location: [facility];  Service: Gastroenterology;  Laterality: N/A;   GYNECOLOGIC CRYOSURGERY     NECK SURGERY  02/15/2010   PELVIC LAPAROSCOPY  02/15/1989   TOTAL ABDOMINAL  HYSTERECTOMY N/A    Phreesia 07/30/2019   Social History:  reports that she has never smoked. She has never been exposed to tobacco smoke. She has never used smokeless tobacco. She reports that she does not drink alcohol and does not use drugs.  Allergies  Allergen Reactions    Clindamycin/Lincomycin Rash    Diffuse drug reaction eruption   Lamictal [Lamotrigine] Rash   Banana Diarrhea   Lipitor [Atorvastatin ] Other (See Comments)    Severe fatigue   Pineapple Diarrhea   Watermelon Concentrate [Citrullus Vulgaris] Rash    Family History  Problem Relation Age of Onset   Diabetes Mother    Breast cancer Mother 33   Thyroid  disease Mother    Depression Mother    Hypertension Father    Heart disease Father    Heart attack Father 70       Two instances, first at age 9   Stroke Father    Alcohol abuse Father    Throat cancer Father    Diabetes Maternal Grandmother    Depression Maternal Grandmother    Uterine cancer Maternal Grandmother      Prior to Admission medications   Medication Sig Start Date End Date Taking? Authorizing Provider  acetaminophen  (TYLENOL ) 500 MG tablet Take 1,000 mg by mouth every 6 (six) hours as needed for mild pain (pain score 1-3).   Yes [provider]  apixaban  (ELIQUIS ) 5 MG TABS tablet Take 1 tablet (5 mg total) by mouth 2 (two) times daily. 09/26/23  Yes Mealor, Augustus E, MD  Cholecalciferol (VITAMIN D3) 1000 units CAPS Take 4,000 Units by mouth daily.   Yes [provider]  Dapagliflozin  Pro-metFORMIN  ER (XIGDUO  XR) 11-998 MG TB24 TAKE 1 TABLET BY MOUTH EVERYDAY AT BEDTIME 11/17/23  Yes Clapp, Kara F, PA-C  diphenhydramine -acetaminophen  (TYLENOL  PM) 25-500 MG TABS tablet Take 3 tablets by mouth at bedtime.   Yes [provider]  DULoxetine  (CYMBALTA ) 60 MG capsule Take 120 mg by mouth at bedtime. 11/04/16  Yes [provider]  flecainide  (TAMBOCOR ) 50 MG tablet TAKE 1 TABLET BY MOUTH 2 TIMES DAILY. 12/12/23  Yes Fenton, Clint R, PA  insulin  glargine (LANTUS ) 100 UNIT/ML Solostar Pen Inject 30 Units into the skin daily. 09/12/23  Yes Clapp, Kara F, PA-C  nebivolol  (BYSTOLIC ) 5 MG tablet Take 1 tablet (5 mg total) by mouth at bedtime. 11/07/23  Yes Claudene Toribio BROCKS, MD  omeprazole  (PRILOSEC )  20 MG capsule Take 20 mg by mouth daily as needed (indigestion / heart burn).   Yes [provider]  rosuvastatin  (CRESTOR ) 10 MG tablet TAKE 1 TABLET (10 MG TOTAL) BY MOUTH DAILY. Patient taking differently: Take 10 mg by mouth at bedtime. 07/15/23  Yes Mealor, Augustus E, MD  tiZANidine  (ZANAFLEX ) 4 MG tablet Take 4 mg by mouth at bedtime. 05/14/21  Yes [provider]  CAPLYTA 21 MG CAPS Take 1 capsule by mouth at bedtime. Patient not taking: Reported on 12/20/2023 11/21/23   [provider]  oxyCODONE  (ROXICODONE ) 5 MG immediate release tablet Take 1 tablet (5 mg total) by mouth every 4 (four) hours as needed for severe pain (pain score 7-10). Patient not taking: Reported on 12/20/2023 08/29/23   Hildegard Loge, PA-C  ziprasidone (GEODON) 40 MG capsule Take 40 mg by mouth every evening. 12/16/23   [provider]    Physical Exam: BP 131/61   Pulse 77   Temp 98.6 F (37 C) (Oral)   Resp 16   Ht  5' 1 (1.549 m)   Wt 84.8 kg   LMP 05/12/2011   SpO2 93%   BMI 35.32 kg/m  General: Pleasant, well-appearing *** laying in bed. No acute distress. HEENT: [place]/AT. Anicteric sclera CV: RRR. No murmurs, rubs, or gallops. No LE edema Pulmonary: Lungs CTAB. Normal effort. No wheezing or rales. Abdominal: Soft, nontender, nondistended. Normal bowel sounds. Extremities: Palpable radial and DP pulses. Normal ROM. Skin: Warm and dry. No obvious rash or lesions. Neuro: A&Ox3. Moves all extremities. Normal sensation to light touch. No focal deficit. Psych: Normal mood and affect          Labs on Admission:  Basic Metabolic Panel: Recent Labs  Lab 12/20/23 2039  NA 136  K 4.6  CL 103  GLUCOSE 173*  BUN 12  CREATININE 0.60   Liver Function Tests: No results for input(s): AST, ALT, ALKPHOS, BILITOT, PROT, ALBUMIN  in the last 168 hours. No results for input(s): LIPASE, AMYLASE in the last 168 hours. No results for input(s): AMMONIA in the last  168 hours. CBC: Recent Labs  Lab 12/20/23 1945 12/20/23 2039  WBC 7.4  --   NEUTROABS 4.8  --   HGB 13.2 11.9*  HCT 43.8 35.0*  MCV 86.2  --   PLT 254  --    Cardiac Enzymes: No results for input(s): CKTOTAL, CKMB, CKMBINDEX, TROPONINI in the last 168 hours. BNP (last 3 results) No results for input(s): BNP in the last 8760 hours.  ProBNP (last 3 results) No results for input(s): PROBNP in the last 8760 hours.  CBG: Recent Labs  Lab 12/20/23 1919  GLUCAP 195*    Radiological Exams on Admission: CT HEAD CODE STROKE WO CONTRAST (LKW 0-4.5h, LVO 0-24h) Result Date: 12/20/2023 EXAM: CT HEAD WITHOUT CONTRAST 12/20/2023 07:25:59 PM TECHNIQUE: CT of the head was performed without the administration of intravenous contrast. Automated exposure control, iterative reconstruction, and/or weight based adjustment of the mA/kV was utilized to reduce the radiation dose to as low as reasonably achievable. COMPARISON: None available. CLINICAL HISTORY: Neuro deficit, acute, stroke suspected. FINDINGS: BRAIN AND VENTRICLES: No acute hemorrhage. No evidence of acute infarct. No hydrocephalus. No extra-axial collection. No mass effect or midline shift. ORBITS: No acute abnormality. SINUSES: No acute abnormality. SOFT TISSUES AND SKULL: No skull fracture. Findings discussed with Dr. Bari via telephone at 7:35 PM. IMPRESSION: 1. No acute intracranial abnormality. Electronically signed by: Gilmore Molt MD 12/20/2023 07:37 PM EST RP Workstation: [id number]   Assessment/Plan Morgan [name] is a 58 y.o. female with medical history significant for paroxysmal A-fib on Eliquis , bipolar 1 disorder, anxiety and depression, migraine headache, HTN, T2DM, HLD, cervical dysplasia, chronic pain syndrome, hepatic steatosis, lumbar radiculopathy and obesity who presented to the ED for evaluation of persistent headache and new left face and arm numbness and admitted for headache with neurological  deficit  # Migraine headache # Acute left face and LUE numbness - -  # Paroxysmal A-fib - -  # T2DM with hyperglycemia - -  # HTN -  # HLD -  # Bipolar 1 disorder # Anxiety and depression -  # Chronic pain syndrome # Lumbar radiculopathy  # Class II obesity Body mass index is 35.32 kg/m. Filed Weights   12/20/23 1933  Weight: 84.8 kg  - F/u with PCP for weight lost and nutrition counseling  DVT prophylaxis: Lovenox      Code Status: Full Code  Consults called: Neurology  Family Communication: No family at bedside  Severity of Illness: The  appropriate patient status for this patient is OBSERVATION. Observation status is judged to be reasonable and necessary in order to provide the required intensity of service to ensure the patient's safety. The patient's presenting symptoms, physical exam findings, and initial radiographic and laboratory data in the context of their medical condition is felt to place them at decreased risk for further clinical deterioration. Furthermore, it is anticipated that the patient will be medically stable for discharge from the hospital within 2 midnights of admission.   Level of care: Progressive    Lou Claretta HERO, MD 12/20/2023, 10:58 PM [facility] Pager: [phone] Isaiah 41:10   If 7PM-7AM, please contact night-coverage [email or web address] Password [email or web address]

## 2023-12-20 NOTE — H&P (Incomplete)
 History and Physical  Morgan [name] FMW:[id number] DOB: 21-Jul-1965 DOA: 12/20/2023  PCP: [name], PA-C   Chief Complaint: ***   HPI: Morgan [name] is a 58 y.o. female with medical history significant for ***   ED Course: Initial vitals show ***. Initial labs significant for ***. EKG shows ***. CXR shows ***. Pt received ***. *** was consulted for evaluation. TRH was consulted for admission.   Review of Systems: Please see HPI for pertinent positives and negatives. A complete 10 system review of systems are otherwise negative.  Past Medical History:  Diagnosis Date   Anxiety    Atrial fibrillation (HCC)    Bipolar disorder (HCC)    Breast mass    right, removed and benign   Cancer (HCC) 09/16/2019   ovarian ca no radiation no chemo   Cervical dysplasia    Diabetes mellitus without complication (HCC)    Endometriosis    GERD (gastroesophageal reflux disease)    Hypercholesteremia    Hypertension    Migraine    Ovarian cancer (HCC)    Pelvic kidney    left   Postpartum depression    hx of   Past Surgical History:  Procedure Laterality Date   ATRIAL FIBRILLATION ABLATION N/A 05/16/2023   Procedure: ATRIAL FIBRILLATION ABLATION;  Surgeon: Nancey Eulas BRAVO, MD;  Location: [facility];  Service: Cardiovascular;  Laterality: N/A;   ATRIAL FIBRILLATION ABLATION N/A 12/02/2023   Procedure: ATRIAL FIBRILLATION ABLATION;  Surgeon: Nancey Eulas BRAVO, MD;  Location: [facility];  Service: Cardiovascular;  Laterality: N/A;   BACK SURGERY  02/16/2012   BREAST BIOPSY Right 2016   BREAST BIOPSY Right 2020   BREAST EXCISIONAL BIOPSY Right 2021   BREAST EXCISIONAL BIOPSY Right 2016   benign   BREAST LUMPECTOMY WITH RADIOACTIVE SEED LOCALIZATION Right 10/28/2014   Procedure: RIGHT BREAST LUMPECTOMY WITH RADIOACTIVE SEED LOCALIZATION;  Surgeon: Deward Null III, MD;  Location: [facility];  Service: General;  Laterality: Right;   BREAST LUMPECTOMY  WITH RADIOACTIVE SEED LOCALIZATION Right 06/18/2019   Procedure: RIGHT BREAST RADIOACTIVE SEED LOCALIZATION LUMPECTOMY;  Surgeon: Null Deward MOULD, MD;  Location: [facility];  Service: General;  Laterality: Right;   BREAST SURGERY N/A    Phreesia 07/30/2019   CESAREAN SECTION  02/15/2001   COLONOSCOPY WITH PROPOFOL  N/A 05/01/2018   Procedure: COLONOSCOPY WITH PROPOFOL ;  Surgeon: Therisa Bi, MD;  Location: [facility];  Service: Gastroenterology;  Laterality: N/A;   COLPOSCOPY     ESOPHAGOGASTRODUODENOSCOPY (EGD) WITH PROPOFOL  N/A 05/01/2018   Procedure: ESOPHAGOGASTRODUODENOSCOPY (EGD) WITH PROPOFOL ;  Surgeon: Therisa Bi, MD;  Location[facility];  Service: Gastroenterology;  Laterality: N/A;   ESOPHAGOGASTRODUODENOSCOPY (EGD) WITH PROPOFOL  N/A 11/13/2018   Procedure: ESOPHAGOGASTRODUODENOSCOPY (EGD) WITH PROPOFOL ;  Surgeon: Therisa Bi, MD;  Location: [facility] END[facility]COPY;  Service: Gastroenterology;  Laterality: N/A;   GYNECOLOGIC CRYOSURGERY     NECK SURGERY  02/15/2010   PELVIC LAPAROSCOPY  02/15/1989   TOTAL ABDOMINAL HYSTERECTOMY N/A    Phreesia 07/30/2019   Social History:  reports that she has never smoked. She has never been exposed to tobacco smoke. She has never used smokeless tobacco. She reports that she does not drink alcohol and does not use drugs.  Allergies  Allergen Reactions   Clindamycin/Lincomycin Rash    Diffuse drug reaction eruption   Lamictal [Lamotrigine] Rash   Banana Diarrhea   Lipitor [Atorvastatin ] Other (See Comments)    Severe fatigue   Pineapple Diarrhea  Watermelon Concentrate [Citrullus Vulgaris] Rash    Family History  Problem Relation Age of Onset   Diabetes Mother    Breast cancer Mother 34   Thyroid  disease Mother    Depression Mother    Hypertension Father    Heart disease Father    Heart attack Father 45       Two instances, first at age 68   Stroke Father    Alcohol abuse Father    Throat cancer Father     Diabetes Maternal Grandmother    Depression Maternal Grandmother    Uterine cancer Maternal Grandmother      Prior to Admission medications   Medication Sig Start Date End Date Taking? Authorizing Provider  ziprasidone (GEODON) 40 MG capsule Take 40 mg by mouth every evening. 12/16/23  Yes [provider]  acetaminophen  (TYLENOL ) 500 MG tablet Take 1,000 mg by mouth every 6 (six) hours as needed for mild pain (pain score 1-3).    [provider]  apixaban  (ELIQUIS ) 5 MG TABS tablet Take 1 tablet (5 mg total) by mouth 2 (two) times daily. 09/26/23   Mealor, Augustus E, MD  CAPLYTA 21 MG CAPS Take 1 capsule by mouth at bedtime. 11/21/23   [provider]  Cholecalciferol (VITAMIN D3) 1000 units CAPS Take 2,000 Units by mouth 2 (two) times daily.    [provider]  Dapagliflozin  Pro-metFORMIN  ER (XIGDUO  XR) 11-998 MG TB24 TAKE 1 TABLET BY MOUTH EVERYDAY AT BEDTIME 11/17/23   Clapp, Kara F, PA-C  diphenhydramine -acetaminophen  (TYLENOL  PM) 25-500 MG TABS tablet Take 3 tablets by mouth at bedtime.    [provider]  DULoxetine  (CYMBALTA ) 60 MG capsule Take 120 mg by mouth at bedtime. 11/04/16   [provider]  flecainide  (TAMBOCOR ) 50 MG tablet TAKE 1 TABLET BY MOUTH 2 TIMES DAILY. 12/12/23   Fenton, Clint R, PA  insulin  glargine (LANTUS ) 100 UNIT/ML Solostar Pen Inject 30 Units into the skin daily. 09/12/23   [name], PA-C  nebivolol  (BYSTOLIC ) 5 MG tablet Take 1 tablet (5 mg total) by mouth at bedtime. 11/07/23   Claudene Toribio BROCKS, MD  omeprazole  (PRILOSEC ) 20 MG capsule Take 20 mg by mouth daily as needed (indigestion / heart burn).    [provider]  oxyCODONE  (ROXICODONE ) 5 MG immediate release tablet Take 1 tablet (5 mg total) by mouth every 4 (four) hours as needed for severe pain (pain score 7-10). 08/29/23   Hildegard, Amjad, PA-C  rosuvastatin  (CRESTOR ) 10 MG tablet TAKE 1 TABLET (10 MG TOTAL) BY MOUTH DAILY. Patient taking  differently: Take 10 mg by mouth at bedtime. 07/15/23   Mealor, Augustus E, MD  tiZANidine  (ZANAFLEX ) 4 MG tablet Take 4 mg by mouth at bedtime. 05/14/21   [provider]    Physical Exam: BP 131/61   Pulse 77   Temp 98.6 F (37 C) (Oral)   Resp 16   Ht 5' 1 (1.549 m)   Wt 84.8 kg   LMP 05/12/2011   SpO2 93%   BMI 35.32 kg/m  ***         Labs on Admission:  Basic Metabolic Panel: Recent Labs  Lab 12/20/23 2039  NA 136  K 4.6  CL 103  GLUCOSE 173*  BUN 12  CREATININE 0.60   Liver Function Tests: No results for input(s): AST, ALT, ALKPHOS, BILITOT, PROT, ALBUMIN  in the last 168 hours. No results for input(s): LIPASE, AMYLASE in the last 168 hours. No results for  input(s): AMMONIA in the last 168 hours. CBC: Recent Labs  Lab 12/20/23 1945 12/20/23 2039  WBC 7.4  --   NEUTROABS 4.8  --   HGB 13.2 11.9*  HCT 43.8 35.0*  MCV 86.2  --   PLT 254  --    Cardiac Enzymes: No results for input(s): CKTOTAL, CKMB, CKMBINDEX, TROPONINI in the last 168 hours. BNP (last 3 results) No results for input(s): BNP in the last 8760 hours.  ProBNP (last 3 results) No results for input(s): PROBNP in the last 8760 hours.  CBG: Recent Labs  Lab 12/20/23 1919  GLUCAP 195*    Radiological Exams on Admission: CT HEAD CODE STROKE WO CONTRAST (LKW 0-4.5h, LVO 0-24h) Result Date: 12/20/2023 EXAM: CT HEAD WITHOUT CONTRAST 12/20/2023 07:25:59 PM TECHNIQUE: CT of the head was performed without the administration of intravenous contrast. Automated exposure control, iterative reconstruction, and/or weight based adjustment of the mA/kV was utilized to reduce the radiation dose to as low as reasonably achievable. COMPARISON: None available. CLINICAL HISTORY: Neuro deficit, acute, stroke suspected. FINDINGS: BRAIN AND VENTRICLES: No acute hemorrhage. No evidence of acute infarct. No hydrocephalus. No extra-axial collection. No mass effect or midline  shift. ORBITS: No acute abnormality. SINUSES: No acute abnormality. SOFT TISSUES AND SKULL: No skull fracture. Findings discussed with Dr. Bari via telephone at 7:35 PM. IMPRESSION: 1. No acute intracranial abnormality. Electronically signed by: Gilmore Molt MD 12/20/2023 07:37 PM EST RP Workstation: [id number]   Assessment/Plan Ahmiya R Barnett is a 58 y.o. female with medical history significant for ***   #***  #***  #***  #***  #***  #***  #***  DVT prophylaxis: Lovenox      Code Status: Prior  Consults called: ***  Family Communication: ***  Severity of Illness: {Observation/Inpatient:21159}  Level of care: Progressive    Lou Claretta HERO, MD 12/20/2023, 10:11 PM [facility] Pager: [phone] Isaiah 41:10   If 7PM-7AM, please contact night-coverage [email or web address] Password [email or web address]

## 2023-12-20 NOTE — Consult Note (Signed)
 [facility] Consult Services   Patient Name:   Morgan [name], Morgan [name] Date of Birth:   Nov 12, 1965 Identification Number:   MRN - [id number] Date of Service:   12/20/2023 19:29:11  Diagnosis:       G44.221 - Chronic Headache  Impression:      Migraine    I would not recommend IV thrombolytic therapy for this patient for 2 reasons the first being I do not feel that her symptoms are due to an acute ischemic stroke I feel that she is suffering from a migraine    Secondly the patient is on anticoagulation which she did confirm has been taken within the past 48 hours    Noncontrast CT of the head is unremarkable    I recommend a migraine cocktail I am also can recommend 1 g of Keppra now with 2 g of magnesium     A dose of Solu-Medrol  125 mg now    If the patient continues to have improvement/resolution of her migrainous symptoms she safer discharge    If her migraine persist however she may need to be admitted then for MRI of the brain with and without contrast    Okay to continue anticoagulation noncontrast CT of the head was unremarkable  Sign Out:       Discussed with Emergency Department Provider    ------------------------------------------------------------------------------  Advanced Imaging: Advanced Imaging Deferred because:  Stroke not suspected with clinical presentation and exam   Metrics: Last Known Well: Unknown Dispatch Time: 12/20/2023 19:40:56 Arrival Time: 12/20/2023 19:02:00 Initial Response Time: 12/20/2023 19:42:11 Symptoms: Headache . Initial patient interaction: 12/20/2023 19:44:15 NIHSS Assessment Completed: 12/20/2023 19:46:59 Patient is not a candidate for Thrombolytic. Thrombolytic Medical Decision: 12/20/2023 19:47:03 Patient was not deemed candidate for Thrombolytic because of following reasons: other diagnosis suspected complex migraine .  CT Head: I personally reviewed all the CT images that were available to  me and it showed: No acute intracranial abnormality  Primary Provider Notified of Diagnostic Impression and Management Plan on: 12/20/2023 20:22:41    ------------------------------------------------------------------------------  History of Present Illness: Patient is a 58 year old Female.  Patient was brought by private transportation with symptoms of Headache . 58 year old female with a history of hypertension diabetes atrial fibrillation on anticoagulation has been experiencing 6 days of a frontal headache pain behind the right eye sharp stabbing pain at times  She does have a headache she describes it as a throbbing pressure pain at times  On a scale of 1-10 currently is an 8 out of 10  She then developed some numbness and tingling the left side of her face and arm  She denies any photophobia phonophobia or osmophobia or nausea or vomiting    Past Medical History:      Hypertension      Diabetes Mellitus      Atrial Fibrillation      There is no history of Hyperlipidemia      There is no history of Coronary Artery Disease      There is no history of Stroke      There is no history of Covid-19      There is no history of Seizures      There is no history of Migraine Headaches      There is no history of Dementia/MCI Other PMH:  Bipolar  Medications:  Anticoagulant use:  Yes Eliquis  No Antiplatelet use Reviewed EMR for current medications  Allergies:  Reviewed  Social History: Smoking: No Alcohol Use: No  Drug Use: No  Family History:  There is no family history of premature cerebrovascular disease pertinent to this consultation  ROS : 14 Points Review of Systems was performed and was negative except mentioned in HPI.  Past Surgical History: There Is No Surgical History Contributory To Today's Visit     Examination: BP(143/91), Pulse(74), Blood Glucose(195) 1A: Level of Consciousness - Alert; keenly responsive + 0 1B: Ask Month and Age - Both  Questions Right + 0 1C: Blink Eyes & Squeeze Hands - Performs Both Tasks + 0 2: Test Horizontal Extraocular Movements - Normal + 0 3: Test Visual Fields - No Visual Loss + 0 4: Test Facial Palsy (Use Grimace if Obtunded) - Normal symmetry + 0 5A: Test Left Arm Motor Drift - No Drift for 10 Seconds + 0 5B: Test Right Arm Motor Drift - No Drift for 10 Seconds + 0 6A: Test Left Leg Motor Drift - No Drift for 5 Seconds + 0 6B: Test Right Leg Motor Drift - No Drift for 5 Seconds + 0 7: Test Limb Ataxia (FNF/Heel-Shin) - No Ataxia + 0 8: Test Sensation - Normal; No sensory loss + 0 9: Test Language/Aphasia - Normal; No aphasia + 0 10: Test Dysarthria - Normal + 0 11: Test Extinction/Inattention - No abnormality + 0  NIHSS Score: 0   Pre-Morbid Modified Rankin Scale: 0 Points = No symptoms at all  Spoke with : Dr. Bari I reviewed the available imaging via [facility] and initiated discussion with the primary provider  This consult was conducted in real time using interactive audio and video technology. Patient was informed of the technology being used for this visit and agreed to proceed. Patient located in hospital and provider located at home/office setting.   Patient is being evaluated for possible acute neurologic impairment and high probability of imminent or life-threatening deterioration. I spent total of 35 minutes providing care to this patient, including time for face to face visit via telemedicine, review of medical records, imaging studies and discussion of findings with providers, the patient and/or family.    Dr Lamar Granville   [facility] For Inpatient follow-up with [facility] physician please call [facility] at [phone]. As we are not an outpatient service for any post hospital discharge needs please contact the hospital for assistance. If you have any questions for the [facility] physicians or need to reconsult for clinical or diagnostic changes please contact  us  via [facility] at [phone].  Non-radiologist review of imaging performed to assist with emergent clinical decision-making. Remote physician workstations do not possess the same resolution, calibration, or diagnostic capabilities as hospital-based radiology reading stations, and formal radiologist read is necessary.   Signature : Lamar Granville

## 2023-12-20 NOTE — ED Provider Notes (Signed)
 [place] EMERGENCY DEPARTMENT AT [facility] Provider Note   CSN: [id number] Arrival date & time: 12/20/23  1902     Patient presents with: Headache   Sathvika R Credeur is a 58 y.o. female.   HPI   58 year old female presents emergency department with headache and sudden onset left-sided face and arm numbness, ongoing for about an hour.  Patient admits to history of migraines when she was a teenager.  These have been resolved.  For the past week she has had an intermittent headache that 2 days ago became persistent, waxing and waning in severity but never resolving.  It is mainly in the left side and posterior.  About an hour prior to arrival she had sudden onset left-sided arm and face numbness.  Denies any facial droop, speech changes, vision loss, focal weakness.  Code stroke called in triage.  Prior to Admission medications   Medication Sig Start Date End Date Taking? Authorizing Provider  acetaminophen  (TYLENOL ) 500 MG tablet Take 1,000 mg by mouth every 6 (six) hours as needed for mild pain (pain score 1-3).    [provider]  apixaban  (ELIQUIS ) 5 MG TABS tablet Take 1 tablet (5 mg total) by mouth 2 (two) times daily. 09/26/23   Mealor, Augustus E, MD  CAPLYTA 21 MG CAPS Take 1 capsule by mouth at bedtime. 11/21/23   [provider]  Cholecalciferol (VITAMIN D3) 1000 units CAPS Take 2,000 Units by mouth 2 (two) times daily.    [provider]  Dapagliflozin  Pro-metFORMIN  ER (XIGDUO  XR) 11-998 MG TB24 TAKE 1 TABLET BY MOUTH EVERYDAY AT BEDTIME 11/17/23   Clapp, Kara F, PA-C  diphenhydramine -acetaminophen  (TYLENOL  PM) 25-500 MG TABS tablet Take 3 tablets by mouth at bedtime.    [provider]  DULoxetine  (CYMBALTA ) 60 MG capsule Take 120 mg by mouth at bedtime. 11/04/16   [provider]  flecainide  (TAMBOCOR ) 50 MG tablet TAKE 1 TABLET BY MOUTH 2 TIMES DAILY. 12/12/23   Fenton, Clint R, PA  insulin  glargine (LANTUS ) 100 UNIT/ML  Solostar Pen Inject 30 Units into the skin daily. 09/12/23   [name], PA-C  nebivolol  (BYSTOLIC ) 5 MG tablet Take 1 tablet (5 mg total) by mouth at bedtime. 11/07/23   Claudene Toribio BROCKS, MD  omeprazole  (PRILOSEC ) 20 MG capsule Take 20 mg by mouth daily as needed (indigestion / heart burn).    [provider]  oxyCODONE  (ROXICODONE ) 5 MG immediate release tablet Take 1 tablet (5 mg total) by mouth every 4 (four) hours as needed for severe pain (pain score 7-10). 08/29/23   Hildegard, Amjad, PA-C  rosuvastatin  (CRESTOR ) 10 MG tablet TAKE 1 TABLET (10 MG TOTAL) BY MOUTH DAILY. Patient taking differently: Take 10 mg by mouth at bedtime. 07/15/23   Mealor, Augustus E, MD  tiZANidine  (ZANAFLEX ) 4 MG tablet Take 4 mg by mouth at bedtime. 05/14/21   [provider]    Allergies: Clindamycin/lincomycin, Lamictal [lamotrigine], Banana, Lipitor [atorvastatin ], Pineapple, and Watermelon concentrate [citrullus vulgaris]    Review of Systems  Constitutional:  Negative for fever.  Eyes:  Negative for visual disturbance.  Respiratory:  Negative for shortness of breath.   Cardiovascular:  Negative for chest pain.  Gastrointestinal:  Negative for abdominal pain, diarrhea and vomiting.  Musculoskeletal:  Negative for neck stiffness.  Skin:  Negative for rash.  Neurological:  Positive for numbness and headaches. Negative for dizziness, facial asymmetry, speech difficulty and weakness.    Updated Vital Signs BP (!) 143/91  Pulse 74   Temp 98.6 F (37 C) (Oral)   Resp 19   Ht 5' 1 (1.549 m)   Wt 84.8 kg   LMP 05/12/2011   SpO2 97%   BMI 35.32 kg/m   Physical Exam Vitals and nursing note reviewed.  Constitutional:      Appearance: Normal appearance.  HENT:     Head: Normocephalic.     Mouth/Throat:     Mouth: Mucous membranes are moist.  Eyes:     General: No visual field deficit. Cardiovascular:     Rate and Rhythm: Normal rate.  Pulmonary:     Effort: Pulmonary effort is  normal. No respiratory distress.  Abdominal:     Palpations: Abdomen is soft.     Tenderness: There is no abdominal tenderness.  Skin:    General: Skin is warm.  Neurological:     Mental Status: She is alert and oriented to person, place, and time. Mental status is at baseline.     Cranial Nerves: No cranial nerve deficit, dysarthria or facial asymmetry.     Sensory: Sensory deficit present.     Motor: No weakness.  Psychiatric:        Mood and Affect: Mood normal.     (all labs ordered are listed, but only abnormal results are displayed) Labs Reviewed  CBG MONITORING, ED - Abnormal; Notable for the following components:      Result Value   Glucose-Capillary 195 (*)    All other components within normal limits  PROTIME-INR  APTT  CBC  DIFFERENTIAL  COMPREHENSIVE METABOLIC PANEL WITH GFR  ETHANOL  URINE DRUG SCREEN  I-STAT CHEM 8, ED    EKG: None  Radiology: No results found.   .Critical Care  Performed by: Bari Roxie HERO, DO Authorized by: Bari Roxie HERO, DO   Critical care provider statement:    Critical care time (minutes):  30   Critical care time was exclusive of:  Separately billable procedures and treating other patients   Critical care was necessary to treat or prevent imminent or life-threatening deterioration of the following conditions:  CNS failure or compromise   Critical care was time spent personally by me on the following activities:  Development of treatment plan with patient or surrogate, discussions with consultants, evaluation of patient's response to treatment, examination of patient, ordering and review of laboratory studies, ordering and review of radiographic studies, ordering and performing treatments and interventions, pulse oximetry, re-evaluation of patient's condition and review of old charts   I assumed direction of critical care for this patient from another provider in my specialty: no     Care discussed with: admitting provider       Medications Ordered in the ED - No data to display                                  Medical Decision Making Amount and/or Complexity of Data Reviewed Labs: ordered. Radiology: ordered.  Risk Decision regarding hospitalization.   58 year old female presents emergency department with headache and acute left-sided face and arm numbness.  Sensory deficit was acute within an hour of arrival, code stroke placed.  NIH of 1 for decreased sensation to the left of the face and arm.  Patient evaluated by teleneurology.  CT of the head unremarkable.  Impression from neurology is that this most most likely a complex migraine.  They ordered a migraine cocktail.  Patient  did not have any improvement.  Will plan for medical admit, further treatment of migraine and plan for MRI, MRA and MRV imaging tomorrow for completion.  Last dose of Eliquis  was this morning, recommendation from neurology to hold Eliquis  until MRI imaging results  Patients evaluation and results requires admission for further treatment and care.  Spoke with hospitalist, reviewed patient's ED course and they accept admission.  Patient agrees with admission plan, offers no new complaints and is stable/unchanged at time of admit.     Final diagnoses:  None    ED Discharge Orders     None          Bari Roxie HERO, DO 12/20/23 2311

## 2023-12-20 NOTE — ED Triage Notes (Addendum)
 Pt to er via ems, per ems pt is here for a headache.  Pt states that she is here for a headache for the past 6 days, states that the intensity has varied.  States that about an hour ago the L side of her face and her L arm when numb.  Md notified, pt has equal strength bilaterally, reports mild decrease in sensation to bicep but not hand on L side, code stroke called

## 2023-12-21 ENCOUNTER — Observation Stay (HOSPITAL_COMMUNITY)

## 2023-12-21 DIAGNOSIS — I10 Essential (primary) hypertension: Secondary | ICD-10-CM | POA: Diagnosis not present

## 2023-12-21 DIAGNOSIS — F419 Anxiety disorder, unspecified: Secondary | ICD-10-CM | POA: Diagnosis not present

## 2023-12-21 DIAGNOSIS — G43909 Migraine, unspecified, not intractable, without status migrainosus: Secondary | ICD-10-CM

## 2023-12-21 DIAGNOSIS — G43809 Other migraine, not intractable, without status migrainosus: Secondary | ICD-10-CM

## 2023-12-21 DIAGNOSIS — R200 Anesthesia of skin: Secondary | ICD-10-CM

## 2023-12-21 DIAGNOSIS — I480 Paroxysmal atrial fibrillation: Secondary | ICD-10-CM | POA: Diagnosis not present

## 2023-12-21 DIAGNOSIS — E66812 Obesity, class 2: Secondary | ICD-10-CM

## 2023-12-21 DIAGNOSIS — E785 Hyperlipidemia, unspecified: Secondary | ICD-10-CM

## 2023-12-21 DIAGNOSIS — F32A Depression, unspecified: Secondary | ICD-10-CM

## 2023-12-21 DIAGNOSIS — E119 Type 2 diabetes mellitus without complications: Secondary | ICD-10-CM

## 2023-12-21 DIAGNOSIS — F319 Bipolar disorder, unspecified: Secondary | ICD-10-CM | POA: Diagnosis not present

## 2023-12-21 DIAGNOSIS — Z794 Long term (current) use of insulin: Secondary | ICD-10-CM

## 2023-12-21 LAB — GLUCOSE, CAPILLARY
Glucose-Capillary: 196 mg/dL — ABNORMAL HIGH (ref 70–99)
Glucose-Capillary: 153 mg/dL — ABNORMAL HIGH (ref 70–99)
Glucose-Capillary: 184 mg/dL — ABNORMAL HIGH (ref 70–99)
Glucose-Capillary: 154 mg/dL — ABNORMAL HIGH (ref 70–99)

## 2023-12-21 LAB — COMPREHENSIVE METABOLIC PANEL WITH GFR
ALT: 18 U/L (ref 0–44)
AST: 24 U/L (ref 15–41)
Albumin: 4 g/dL (ref 3.5–5.0)
Alkaline Phosphatase: 121 U/L (ref 38–126)
Anion gap: 12 (ref 5–15)
BUN: 10 mg/dL (ref 6–20)
CO2: 24 mmol/L (ref 22–32)
Calcium: 10.1 mg/dL (ref 8.9–10.3)
Chloride: 102 mmol/L (ref 98–111)
Creatinine, Ser: 0.66 mg/dL (ref 0.44–1.00)
GFR, Estimated: >60 mL/min (ref >60)
Glucose, Bld: 231 mg/dL — ABNORMAL HIGH (ref 70–99)
Potassium: 4 mmol/L (ref 3.5–5.1)
Sodium: 138 mmol/L (ref 135–145)
Total Bilirubin: 0.5 mg/dL (ref 0.0–1.2)
Total Protein: 7.2 g/dL (ref 6.5–8.1)

## 2023-12-21 LAB — LIPID PANEL
Cholesterol: 150 mg/dL (ref 0–200)
HDL: 41 mg/dL (ref >40)
LDL Cholesterol: 84 mg/dL (ref 0–99)
Total CHOL/HDL Ratio: 3.7 ratio
Triglycerides: 125 mg/dL (ref <150)
VLDL: 25 mg/dL (ref 0–40)

## 2023-12-21 LAB — ECHOCARDIOGRAM COMPLETE
AR max vel: 2.28 cm2
AV Area VTI: 2.08 cm2
AV Area mean vel: 2.26 cm2
AV Mean grad: 6 mmHg
AV Peak grad: 11.3 mmHg
Ao pk vel: 1.68 m/s
Area-P 1/2: 4.31 cm2
Calc EF: 73.8 %
Height: 61 in
S' Lateral: 2.4 cm
Single Plane A2C EF: 73.8 %
Single Plane A4C EF: 74.1 %
Weight: 2991.2 [oz_av]

## 2023-12-21 LAB — URINE DRUG SCREEN
Amphetamines: NEGATIVE
Barbiturates: NEGATIVE
Benzodiazepines: NEGATIVE
Cocaine: NEGATIVE
Fentanyl: NEGATIVE
Methadone Scn, Ur: NEGATIVE
Opiates: NEGATIVE
Tetrahydrocannabinol: NEGATIVE

## 2023-12-21 LAB — PROTIME-INR
INR: 1 (ref 0.8–1.2)
Prothrombin Time: 13.9 s (ref 11.4–15.2)

## 2023-12-21 LAB — APTT: aPTT: 29 s (ref 24–36)

## 2023-12-21 MED ORDER — PANTOPRAZOLE SODIUM 40 MG PO TBEC
40.0000 mg | DELAYED_RELEASE_TABLET | Freq: Every day | ORAL | Status: DC
  Administered 2023-12-21 – 2023-12-22 (×2): 40 mg via ORAL
  Filled 2023-12-21 (×2): qty 1

## 2023-12-21 MED ORDER — METOCLOPRAMIDE HCL 5 MG PO TABS
5.0000 mg | ORAL_TABLET | Freq: Four times a day (QID) | ORAL | Status: DC | PRN
  Administered 2023-12-21 (×2): 5 mg via ORAL
  Filled 2023-12-21 (×2): qty 1

## 2023-12-21 MED ORDER — APIXABAN 5 MG PO TABS
5.0000 mg | ORAL_TABLET | Freq: Two times a day (BID) | ORAL | Status: DC
  Administered 2023-12-21 – 2023-12-22 (×2): 5 mg via ORAL
  Filled 2023-12-21 (×2): qty 1

## 2023-12-21 MED ORDER — ACETAMINOPHEN 650 MG RE SUPP: 650.0000 mg | RECTAL | Status: DC | PRN

## 2023-12-21 MED ORDER — ACETAMINOPHEN 325 MG PO TABS: 650.0000 mg | ORAL_TABLET | ORAL | Status: DC | PRN

## 2023-12-21 MED ORDER — ACETAMINOPHEN 160 MG/5ML PO SOLN: 650.0000 mg | ORAL | Status: DC | PRN

## 2023-12-21 MED ORDER — GADOBUTROL 1 MMOL/ML IV SOLN
8.0000 mL | Freq: Once | INTRAVENOUS | Status: AC | PRN
  Administered 2023-12-21: 8 mL via INTRAVENOUS

## 2023-12-21 MED ORDER — TOPIRAMATE 25 MG PO TABS
25.0000 mg | ORAL_TABLET | Freq: Every day | ORAL | Status: DC
  Administered 2023-12-21: 25 mg via ORAL
  Filled 2023-12-21: qty 1

## 2023-12-21 MED ORDER — GADOBUTROL 1 MMOL/ML IV SOLN: 8.0000 mL | Freq: Once | INTRAVENOUS | Status: DC | PRN

## 2023-12-21 MED ORDER — KETOROLAC TROMETHAMINE 30 MG/ML IJ SOLN
30.0000 mg | Freq: Four times a day (QID) | INTRAMUSCULAR | Status: DC | PRN
  Administered 2023-12-21 (×2): 30 mg via INTRAVENOUS
  Filled 2023-12-21 (×2): qty 1

## 2023-12-21 NOTE — Progress Notes (Signed)
 PT Cancellation Note and sign off   Patient Details Name: Morgan [name] MRN: [id number] DOB: Apr 18, 1965   Cancelled Treatment:    Reason Eval/Treat Not Completed: Other (comment). Pt made PT aware that she feels at baseline for functional mobility tasks. Pt declines PT eval. PT to sign off. Thank you for this referral.   [place], PT Acute Rehab   [place] VEAR Drone 12/21/2023, 1:28 PM

## 2023-12-21 NOTE — Progress Notes (Signed)
 OT Cancellation Note  Patient Details Name: Morgan [name] MRN: [id number] DOB: 07/03/1965   Cancelled Treatment:    Reason Eval/Treat Not Completed: Other (comment). The patient declined the need for an OT evaluation. She stated she feels as though all her symptoms have resolved and she is back to her baseline level of functioning for self-care management. She asked for therapy to sign off.   Delanna JINNY Lesches, OTR/L 12/21/2023, 2:30 PM

## 2023-12-21 NOTE — Progress Notes (Signed)
 PROGRESS NOTE    Morgan [name]  FMW:[id number] DOB: 01/20/1966 DOA: 12/20/2023 PCP: [name], PA-C    Chief Complaint  Patient presents with   Headache    Brief Narrative:  Patient 58 year old female history of paroxysmal A-fib on Eliquis , bipolar 1 disorder, depression anxiety, migraine headaches, hypertension, type 2 diabetes, hyperlipidemia, cervical dysplasia, chronic pain syndrome, hepatic steatosis, lumbar radiculopathy and obesity presenting to the ED for evaluation of persistent headache with new left upper extremity, left facial numbness admitted for headache with neurological deficits concern for complex migraine headache versus acute CVA.  Neurology consulted.   Assessment & Plan:   Principal Problem:   Headache with neurologic deficit Active Problems:   Bipolar 1 disorder (HCC)   Hyperlipidemia   Paroxysmal A-fib (HCC)   Left facial numbness   Left upper extremity numbness   Obesity, Class II, BMI 35-39.9   Anxiety and depression   Migraine   Type 2 diabetes mellitus without complication, with long-term current use of insulin  (HCC)  #1 probable complex migraine headache/acute left face and left upper extremity numbness -Patient with history of migraine headaches, history of A-fib on chronic anticoagulation presenting with 2-week history of waxing and waning headache.  Acute onset left facial and left upper extremity numbness. - CT head negative for any acute intracranial abnormality. - Patient seen by teleneurology who feels neurodeficit likely could be due to complex migraine but recommended admission to rule out CVA. - Patient given migraine cocktail, IV Solu-Medrol  125 mg x 1, 1 g of Keppra and 2 g of magnesium . - Patient with some clinical improvement. - MRI brain done negative for any acute abnormalities.  MRA head with no large vessel occlusion.  4 mm left ICA aneurysm. - Placed on migraine cocktail of Reglan  and Toradol  as needed. - Case discussed  with neurology who are recommending starting patient on Topamax 25 mg nightly with outpatient follow-up with neurology. - Supportive care.  2.  Paroxysmal atrial fibrillation -Currently normal sinus rhythm and rate controlled. - Continue Bystolic  and flecainide  for rate control. - As MRI head is negative for any acute abnormalities will resume patient's home regimen Eliquis .  3.  Type 2 diabetes with hyperglycemia -Hemoglobin A1c 7.1% (11/23/2023) - CBG noted at 196 this morning. - Discontinue home regimen metformin  while in house and resume on discharge. - Continue home regimen Farxiga. - Continue Semglee  30 units daily, SSI.  4.  Hyperlipidemia -Continue statin.  5.  Hypertension -Bystolic .  6.  Bipolar 1 disorder/anxiety and depression -Stable. - Continue duloxetine .  7.  Chronic pain syndrome/lumbar radiculopathy  - Continue tizanidine  and duloxetine .  8.  Class II obesity -BMI 35.32 kg/m. - Lifestyle modification. - Outpatient follow-up with PCP.   DVT prophylaxis: Eliquis  Code Status: Full Family Communication: Updated patient.  No family at bedside. Disposition: Likely home in the next 24 hours if continued clinical improvement.  Status is: Observation The patient remains OBS appropriate and will d/c before 2 midnights.   Consultants:  Neurology  Procedures:  MRI brain 12/21/2023 MRA head 12/21/2023 2D echo 12/21/2023  Antimicrobials:  Anti-infectives (From admission, onward)    None         Subjective: Patient laying in bed.  Patient denies any chest pain, no shortness of breath.  States some improvement with right retro-orbital headache however right side of her face feels a little funny.  Still with some numbness in her left face however left upper extremity numbness improved/resolved.  Getting ready to go  for MRI.  Objective: Vitals:   12/20/23 2352 12/21/23 0439 12/21/23 0751 12/21/23 1145  BP: 132/74 (!) 93/52 (!) 110/54 (!) 103/55  Pulse:  82 60 62 60  Resp: 18 18  14   Temp: 98 F (36.7 C) 98.2 F (36.8 C) 98.7 F (37.1 C) 97.9 F (36.6 C)  TempSrc: Oral Oral Oral Oral  SpO2: 97% 92% 94% 97%  Weight:      Height:        Intake/Output Summary (Last 24 hours) at 12/21/2023 1805 Last data filed at 12/21/2023 0900 Gross per 24 hour  Intake 660.62 ml  Output --  Net 660.62 ml   Filed Weights   12/20/23 1933  Weight: 84.8 kg    Examination:  General exam: Appears calm and comfortable  Respiratory system: Clear to auscultation. Respiratory effort normal. Cardiovascular system: S1 & S2 heard, RRR. No JVD, murmurs, rubs, gallops or clicks. No pedal edema. Gastrointestinal system: Abdomen is nondistended, soft and nontender. No organomegaly or masses felt. Normal bowel sounds heard. Central nervous system: Alert and oriented. No focal neurological deficits. Extremities: Symmetric 5 x 5 power. Skin: No rashes, lesions or ulcers Psychiatry: Judgement and insight appear normal. Mood & affect appropriate.     Data Reviewed: I have personally reviewed following labs and imaging studies  CBC: Recent Labs  Lab 12/20/23 1945 12/20/23 2039  WBC 7.4  --   NEUTROABS 4.8  --   HGB 13.2 11.9*  HCT 43.8 35.0*  MCV 86.2  --   PLT 254  --     Basic Metabolic Panel: Recent Labs  Lab 12/20/23 2039 12/21/23 0054  NA 136 138  K 4.6 4.0  CL 103 102  CO2  --  24  GLUCOSE 173* 231*  BUN 12 10  CREATININE 0.60 0.66  CALCIUM   --  10.1    GFR: Estimated Creatinine Clearance: 77.6 mL/min (by C-G formula based on SCr of 0.66 mg/dL).  Liver Function Tests: Recent Labs  Lab 12/21/23 0054  AST 24  ALT 18  ALKPHOS 121  BILITOT 0.5  PROT 7.2  ALBUMIN  4.0    CBG: Recent Labs  Lab 12/20/23 1919 12/21/23 0815 12/21/23 1133 12/21/23 1620  GLUCAP 195* 196* 153* 184*     No results found for this or any previous visit (from the past 240 hours).       Radiology Studies: MR MRV HEAD W WO CONTRAST Result  Date: 12/21/2023 EXAM: MRV BRAIN 12/21/2023 12:34:48 PM TECHNIQUE: Multiplanar multisequence MRV of the head was performed with and without the administration of 8 mL of gadobutrol  (GADAVIST ) 1 MMOL/ML injection. Multiplanar 2D and 3D reformatted images are provided for review. COMPARISON: None available. CLINICAL HISTORY: Headache, new onset (Age >= 51y). FINDINGS: The superior sagittal sinus, internal cerebral veins, vein of galen, straight sinus, transverse sinuses, sigmoid sinuses, and jugular bulbs are patent without evidence of thrombus or significant stenosis. The left transverse and sigmoid sinuses are dominant with the right transverse sinus being hypoplastic. IMPRESSION: 1. Negative MRV of the head. Electronically signed by: Dasie Hamburg MD 12/21/2023 02:10 PM EST RP Workstation: [id number]   MR ANGIO HEAD WO CONTRAST Result Date: 12/21/2023 EXAM: MR Angiography Head with intravenous Contrast. 12/21/2023 12:34:48 PM TECHNIQUE: Magnetic resonance angiography images of the head with intravenous contrast. Multiplanar 2D and 3D reformatted images are provided for review. COMPARISON: None provided. CLINICAL HISTORY: Headache, new onset (Age >= 51y) FINDINGS: ANTERIOR CIRCULATION: The intracranial internal carotid arteries are widely patent. A 4 mm  aneurysm projects medially from the anterior genu of the left ICA. The anterior cerebral arteries and middle cerebral arteries are patent without evidence of a proximal branch occlusion or significant proximal stenosis. POSTERIOR CIRCULATION: The included portions of the intracranial vertebral arteries are widely patent to the basilar and codominant. Patent PICA and SCA origins are visualized bilaterally. The basilar artery is widely patent. There are small right and large left posterior communicating arteries with hypoplasia of the left P1 segment. Both posterior cerebral arteries are patent without evidence of a significant proximal stenosis. No aneurysm.  IMPRESSION: 1. No large vessel occlusion or significant proximal intracranial arterial stenosis. 2. 4 mm left ICA aneurysm. Electronically signed by: Dasie Hamburg MD 12/21/2023 02:08 PM EST RP Workstation: [id number]   MR BRAIN WO CONTRAST Result Date: 12/21/2023 EXAM: MRI BRAIN WITHOUT CONTRAST 12/21/2023 12:34:48 PM TECHNIQUE: Multiplanar multisequence MRI of the head/brain was performed without the administration of intravenous contrast. COMPARISON: Head CT 12/20/2023 and MRI 12/07/2016. CLINICAL HISTORY: New onset headache in patient age 55 or older. FINDINGS: BRAIN AND VENTRICLES: There is no evidence of an acute infarct, intracranial hemorrhage, mass, midline shift, hydrocephalus, or extra-axial fluid collection. Cerebral volume is normal. No significant white matter disease is seen for age. Major intracranial vascular flow voids are preserved. ORBITS: No acute abnormality. SINUSES AND MASTOIDS: Trace right mastoid fluid. Clear paranasal sinuses. BONES AND SOFT TISSUES: Normal marrow signal. No acute soft tissue abnormality. IMPRESSION: 1. Unremarkable appearance of the brain for age. Electronically signed by: Dasie Hamburg MD 12/21/2023 02:01 PM EST RP Workstation: [id number]   ECHOCARDIOGRAM COMPLETE Result Date: 12/21/2023    ECHOCARDIOGRAM REPORT   Patient Name:   INGEBORG FITE Date of Exam: 12/21/2023 Medical Rec #:  [id number]        Height:       61.0 in Accession #:    [id number]       Weight:       186.9 lb Date of Birth:  1965-12-26        BSA:          1.835 m Patient Age:    58 years         BP:           93/52 mmHg Patient Gender: F                HR:           64 bpm. Exam Location:  Inpatient Procedure: 2D Echo, Cardiac Doppler, Color Doppler and Saline Contrast Bubble            Study (Both Spectral and Color Flow Doppler were utilized during            procedure). Indications:    Stroke  History:        Patient has prior history of Echocardiogram examinations, most                 recent  04/28/2022. Abnormal ECG, Arrythmias:Atrial Fibrillation,                 Signs/Symptoms:Chest Pain; Risk Factors:Hypertension and                 Diabetes. Shock.  Sonographer:    Ellouise Mose RDCS Referring Phys: [id number] CLARETTA HERO [facility]  Sonographer Comments: Patient is obese. Image acquisition challenging due to patient body habitus. Sending delay. IMPRESSIONS  1. Left ventricular ejection fraction, by estimation, is 65 to 70%. The left ventricle has normal function. The  left ventricle has no regional wall motion abnormalities. Left ventricular diastolic parameters are consistent with Grade I diastolic dysfunction (impaired relaxation). Elevated left ventricular end-diastolic pressure.  2. Right ventricular systolic function is normal. The right ventricular size is normal. There is normal pulmonary artery systolic pressure. The estimated right ventricular systolic pressure is 23.4 mmHg.  3. The mitral valve is normal in structure. Trivial mitral valve regurgitation. No evidence of mitral stenosis.  4. The aortic valve is tricuspid. Aortic valve regurgitation is not visualized. No aortic stenosis is present.  5. The inferior vena cava is normal in size with greater than 50% respiratory variability, suggesting right atrial pressure of 3 mmHg.  6. Agitated saline contrast bubble study was negative, with no evidence of any interatrial shunt. FINDINGS  Left Ventricle: Left ventricular ejection fraction, by estimation, is 65 to 70%. The left ventricle has normal function. The left ventricle has no regional wall motion abnormalities. The left ventricular internal cavity size was normal in size. There is  no left ventricular hypertrophy. Left ventricular diastolic parameters are consistent with Grade I diastolic dysfunction (impaired relaxation). Elevated left ventricular end-diastolic pressure. Right Ventricle: The right ventricular size is normal. No increase in right ventricular wall thickness. Right ventricular  systolic function is normal. There is normal pulmonary artery systolic pressure. The tricuspid regurgitant velocity is 2.26 m/s, and  with an assumed right atrial pressure of 3 mmHg, the estimated right ventricular systolic pressure is 23.4 mmHg. Left Atrium: Left atrial size was normal in size. Right Atrium: Right atrial size was normal in size. Pericardium: There is no evidence of pericardial effusion. Presence of epicardial fat layer. Mitral Valve: The mitral valve is normal in structure. Trivial mitral valve regurgitation. No evidence of mitral valve stenosis. Tricuspid Valve: The tricuspid valve is normal in structure. Tricuspid valve regurgitation is mild . No evidence of tricuspid stenosis. Aortic Valve: The aortic valve is tricuspid. Aortic valve regurgitation is not visualized. No aortic stenosis is present. Aortic valve mean gradient measures 6.0 mmHg. Aortic valve peak gradient measures 11.3 mmHg. Aortic valve area, by VTI measures 2.08  cm. Pulmonic Valve: The pulmonic valve was normal in structure. Pulmonic valve regurgitation is not visualized. No evidence of pulmonic stenosis. Aorta: The aortic root and ascending aorta are structurally normal, with no evidence of dilitation. Venous: The inferior vena cava is normal in size with greater than 50% respiratory variability, suggesting right atrial pressure of 3 mmHg. IAS/Shunts: No atrial level shunt detected by color flow Doppler. Agitated saline contrast was given intravenously to evaluate for intracardiac shunting. Agitated saline contrast bubble study was negative, with no evidence of any interatrial shunt.  LEFT VENTRICLE PLAX 2D LVIDd:         4.30 cm     Diastology LVIDs:         2.40 cm     LV e' medial:    5.77 cm/s LV PW:         1.00 cm     LV E/e' medial:  15.6 LV IVS:        1.00 cm     LV e' lateral:   8.49 cm/s LVOT diam:     2.00 cm     LV E/e' lateral: 10.6 LV SV:         73 LV SV Index:   40 LVOT Area:     3.14 cm LV IVRT:       102  msec  LV Volumes (MOD) LV vol d,  MOD A2C: 53.9 ml LV vol d, MOD A4C: 63.6 ml LV vol s, MOD A2C: 14.1 ml LV vol s, MOD A4C: 16.5 ml LV SV MOD A2C:     39.8 ml LV SV MOD A4C:     63.6 ml LV SV MOD BP:      43.3 ml RIGHT VENTRICLE             IVC RV S prime:     10.60 cm/s  IVC diam: 1.40 cm TAPSE (M-mode): 2.2 cm                             PULMONARY VEINS                             Diastolic Velocity: 59.10 cm/s                             S/D Velocity:       0.70                             Systolic Velocity:  44.20 cm/s LEFT ATRIUM             Index        RIGHT ATRIUM           Index LA diam:        3.40 cm 1.85 cm/m   RA Area:     13.70 cm LA Vol (A2C):   33.5 ml 18.25 ml/m  RA Volume:   26.90 ml  14.66 ml/m LA Vol (A4C):   25.3 ml 13.78 ml/m LA Biplane Vol: 32.1 ml 17.49 ml/m  AORTIC VALVE AV Area (Vmax):    2.28 cm AV Area (Vmean):   2.26 cm AV Area (VTI):     2.08 cm AV Vmax:           168.00 cm/s AV Vmean:          107.000 cm/s AV VTI:            0.351 m AV Peak Grad:      11.3 mmHg AV Mean Grad:      6.0 mmHg LVOT Vmax:         122.00 cm/s LVOT Vmean:        77.100 cm/s LVOT VTI:          0.232 m LVOT/AV VTI ratio: 0.66  AORTA Ao Root diam: 2.60 cm Ao Asc diam:  3.20 cm MITRAL VALVE               TRICUSPID VALVE MV Area (PHT): 4.31 cm    TR Peak grad:   20.4 mmHg MV Decel Time: 176 msec    TR Vmax:        226.00 cm/s MV E velocity: 90.00 cm/s MV A velocity: 74.90 cm/s  SHUNTS MV E/A ratio:  1.20        Systemic VTI:  0.23 m                            Systemic Diam: 2.00 cm Annabella Scarce MD Electronically signed by Annabella Scarce MD Signature Date/Time: 12/21/2023/12:48:21 PM    Final    CT HEAD CODE STROKE WO CONTRAST (LKW 0-4.5h, LVO 0-24h) Result Date:  12/20/2023 EXAM: CT HEAD WITHOUT CONTRAST 12/20/2023 07:25:59 PM TECHNIQUE: CT of the head was performed without the administration of intravenous contrast. Automated exposure control, iterative reconstruction, and/or weight based  adjustment of the mA/kV was utilized to reduce the radiation dose to as low as reasonably achievable. COMPARISON: None available. CLINICAL HISTORY: Neuro deficit, acute, stroke suspected. FINDINGS: BRAIN AND VENTRICLES: No acute hemorrhage. No evidence of acute infarct. No hydrocephalus. No extra-axial collection. No mass effect or midline shift. ORBITS: No acute abnormality. SINUSES: No acute abnormality. SOFT TISSUES AND SKULL: No skull fracture. Findings discussed with Dr. Bari via telephone at 7:35 PM. IMPRESSION: 1. No acute intracranial abnormality. Electronically signed by: Gilmore Molt MD 12/20/2023 07:37 PM EST RP Workstation: [id number]        Scheduled Meds:  diphenhydrAMINE   50 mg Oral QHS   And   acetaminophen   1,000 mg Oral QHS   apixaban   5 mg Oral BID   cholecalciferol  4,000 Units Oral Daily   dapagliflozin  propanediol  10 mg Oral QHS   DULoxetine   120 mg Oral QHS   flecainide   50 mg Oral BID   insulin  aspart  0-15 Units Subcutaneous TID WC   insulin  aspart  0-5 Units Subcutaneous QHS   insulin  glargine-yfgn  30 Units Subcutaneous Daily   nebivolol   5 mg Oral QHS   pantoprazole   40 mg Oral Daily   rosuvastatin   10 mg Oral QHS   tiZANidine   4 mg Oral QHS   Continuous Infusions:  sodium chloride  100 mL/hr at 12/21/23 1119     LOS: 0 days    Time spent: 40 minutes    Toribio Hummer, MD [facility]   To contact the attending provider between 7A-7P or the covering provider during after hours 7P-7A, please log into the web site [email or web address] and access using universal [place] password for that web site. If you do not have the password, please call the hospital operator.  12/21/2023, 6:05 PM

## 2023-12-21 NOTE — Plan of Care (Signed)
 58 year old female seen by tele neuro overnight for acute onset of waxing and waning headache for 2 weeks, worse since Friday, along with left face and arm numbness starting at 1730.  She got a migraine cocktail, solu medrol , and keppra for her migraine overnight. Her headache is now improved.   MRI brain: Unremarkable appearance of the brain for age.   MRV head: Negative  MRA head:  No large vessel occlusion or significant proximal intracranial arterial stenosis. 4 mm left ICA aneurysm.  A/R: 59 year old female with history of Afib and migraines presenting with waxing and waning headache for 2 weeks, worse since Friday, along with left face and arm numbness starting at 1730. CTH was negative for acute process. The patient was not an IV thrombolysis candidate due to being on Eliquis  and non-disabling symptoms/deficits.  - Imaging studies and her response to migraine cocktail are reassuring  - She can start Topamax 25mg  HS and follow up with [facility] or [facility] neuro outpatient  - Discussed with Hospitalist.   Electronically signed: Dr. Chawn Spraggins

## 2023-12-21 NOTE — Progress Notes (Signed)
  Echocardiogram 2D Echocardiogram has been performed.  Morgan [name] 12/21/2023, 9:16 AM

## 2023-12-21 NOTE — TOC CM/SW Note (Signed)
 Transition of Care [facility]) - Inpatient Brief Assessment  Patient Details  Name: Morgan [name] MRN: [id number] Date of Birth: 1965/09/13  [facility] of [facility] CM/SW Contact:    Duwaine GORMAN Aran, LCSW Phone Number: 12/21/2023, 2:56 PM  Clinical Narrative: PT/OT consulted and did not make any recommendations as patient is at baseline. No other needs identified at this time.  Transition of Care Asessment: Insurance and Status: Insurance coverage has been reviewed Patient has primary care physician: Yes Home environment has been reviewed: Resides in single family home with spouse Prior level of function:: Independent with ADLs at baseline Prior/Current Home Services: No current home services Social Drivers of Health Review: SDOH reviewed no interventions necessary Readmission risk has been reviewed: Yes Transition of care needs: no transition of care needs at this time

## 2023-12-21 NOTE — Plan of Care (Signed)
   Problem: Education: Goal: Knowledge of disease or condition will improve Outcome: Progressing Goal: Knowledge of secondary prevention will improve (MUST DOCUMENT ALL) Outcome: Progressing

## 2023-12-22 DIAGNOSIS — E119 Type 2 diabetes mellitus without complications: Secondary | ICD-10-CM | POA: Diagnosis not present

## 2023-12-22 DIAGNOSIS — G43909 Migraine, unspecified, not intractable, without status migrainosus: Secondary | ICD-10-CM

## 2023-12-22 DIAGNOSIS — E785 Hyperlipidemia, unspecified: Secondary | ICD-10-CM | POA: Diagnosis not present

## 2023-12-22 DIAGNOSIS — F419 Anxiety disorder, unspecified: Secondary | ICD-10-CM | POA: Diagnosis not present

## 2023-12-22 DIAGNOSIS — F32A Depression, unspecified: Secondary | ICD-10-CM

## 2023-12-22 LAB — BASIC METABOLIC PANEL WITH GFR
Anion gap: 8 (ref 5–15)
BUN: 18 mg/dL (ref 6–20)
CO2: 24 mmol/L (ref 22–32)
Calcium: 9.2 mg/dL (ref 8.9–10.3)
Chloride: 106 mmol/L (ref 98–111)
Creatinine, Ser: 0.7 mg/dL (ref 0.44–1.00)
GFR, Estimated: >60 mL/min (ref >60)
Glucose, Bld: 148 mg/dL — ABNORMAL HIGH (ref 70–99)
Potassium: 4 mmol/L (ref 3.5–5.1)
Sodium: 138 mmol/L (ref 135–145)

## 2023-12-22 LAB — CBC
HCT: 37 % (ref 36.0–46.0)
Hemoglobin: 11 g/dL — ABNORMAL LOW (ref 12.0–15.0)
MCH: 25.6 pg — ABNORMAL LOW (ref 26.0–34.0)
MCHC: 29.7 g/dL — ABNORMAL LOW (ref 30.0–36.0)
MCV: 86.2 fL (ref 80.0–100.0)
Platelets: 211 K/uL (ref 150–400)
RBC: 4.29 MIL/uL (ref 3.87–5.11)
RDW: 14.6 % (ref 11.5–15.5)
WBC: 7.8 K/uL (ref 4.0–10.5)
nRBC: 0 % (ref 0.0–0.2)

## 2023-12-22 LAB — GLUCOSE, CAPILLARY: Glucose-Capillary: 141 mg/dL — ABNORMAL HIGH (ref 70–99)

## 2023-12-22 MED ORDER — TRAMADOL HCL 50 MG PO TABS: 50.0000 mg | ORAL_TABLET | Freq: Four times a day (QID) | ORAL | 0 refills | Status: AC | PRN

## 2023-12-22 MED ORDER — TOPIRAMATE 25 MG PO TABS: 25.0000 mg | ORAL_TABLET | Freq: Every day | ORAL | 1 refills | Status: DC

## 2023-12-22 NOTE — Discharge Summary (Signed)
 Physician Discharge Summary  DEION FORGUE FMW:[id number] DOB: [birth year] DOA: 12/20/2023  PCP: [name], PA-C  Admit date: 12/20/2023 Discharge date: 12/22/2023  Time spent: 60 minutes  Recommendations for Outpatient Follow-up:  Follow-up with [facility] neurology in 2 weeks for further management of complex migraine headaches. Follow-up with [name], PA-C in 2 weeks.   Discharge Diagnoses:  Principal Problem:   Headache with neurologic deficit Active Problems:   Bipolar 1 disorder (HCC)   Hyperlipidemia   Paroxysmal A-fib (HCC)   Left facial numbness   Left upper extremity numbness   Obesity, Class II, BMI 35-39.9   Anxiety and depression   Migraine   Type 2 diabetes mellitus without complication, with long-term current use of insulin  (HCC)   Discharge Condition: Stable and improved.  Diet recommendation: Carb modified diet  Filed Weights   12/20/23 1933  Weight: 84.8 kg    History of present illness:  HPI per Dr. Lou Camelia JONELLE Good is a 58 y.o. female with medical history significant for paroxysmal A-fib on Eliquis , bipolar 1 disorder, anxiety and depression, migraine headache, HTN, T2DM, HLD, cervical dysplasia, chronic pain syndrome, hepatic steatosis, lumbar radiculopathy and obesity who presented to the ED for evaluation of persistent headache and new left face and arm numbness. Patient reports she has not had a migraine episode since she was a teenager then 2 weeks ago, she started having intermittent migraine headaches described as tingling in nature. Over the last week, the headaches have become persistent, waxing and waning but does not completely resolve. She reports pain behind her eyes, the frontal aspect of her head and in the left side of her head. She has tried Tylenol  without significant relief.  This evening, she had acute onset of left facial and left arm numbness that did not resolved so she presented to the ED for further evaluation.  She  denies any vision changes, focal weakness, speech changes, facial drooping, chest pain, shortness of breath, photophobia, phonophobia, palpitations, nausea, vomiting, fevers or chills.   ED Course: Initial vitals show patient afebrile, HR 70-80, SBP 100-140s. Initial labs significant for glucose 173, otherwise normal renal function, LFTs, ethanol levels and CBC. EKG shows sinus rhythm. CT head with no acute intracranial abnormalities. Teleneurology was consulted for evaluation. Recommended IV Keppra 1 g, IV mag 2 g and IV Solu-Medrol  125 mg for migraine which patient received. TRH was consulted for admission.   Hospital Course:  #1 probable complex migraine headache/acute left face and left upper extremity numbness -Patient with history of migraine headaches, history of A-fib on chronic anticoagulation presented with 2-week history of waxing and waning headache.  Acute onset left facial and left upper extremity numbness. - CT head negative for any acute intracranial abnormality. - Patient seen by teleneurology who feels neurodeficit likely could be due to complex migraine but recommended admission to rule out CVA. - Patient given migraine cocktail, IV Solu-Medrol  125 mg x 1, 1 g of Keppra and 2 g of magnesium  on admission with clinical improvement. - MRI brain done negative for any acute abnormalities.   -MRA head with no large vessel occlusion.  4 mm left ICA aneurysm. -MRV head done negative. - Patient placed on migraine cocktail of Reglan  and Toradol  as needed during the hospitalization. - Case discussed with neurology who are recommending starting patient on Topamax 25 mg nightly with outpatient follow-up with neurology. -Patient will be discharged on Topamax 25 mg nightly in addition to Ultram  as needed. -Ambulatory referral made  to [facility] neurology with outpatient follow-up in 2 weeks. -Patient will be discharged in stable and improved condition.   2.  Paroxysmal atrial fibrillation -  Patient noted to be normal sinus rhythm and rate controlled during the hospitalization.  - Patient maintained on home regimen Bystolic  and flecainide  for rate control. - Eliquis  initially held on admission pending MRI head results.   - MRI head noted to be negative and patient's Eliquis  was subsequently resumed.   - Outpatient follow-up with PCP.     3.  Type 2 diabetes with hyperglycemia -Hemoglobin A1c 7.1% (11/23/2023) - Metformin  held during the hospitalization and will be resumed on discharge.   - Patient maintained on home regimen Farxiga as well as Semglee  30 units daily as well as sliding scale insulin .   - Outpatient follow-up with PCP.    4.  Hyperlipidemia - Patient maintained on home regimen statin.   5.  Hypertension - Patient maintained on home regimen Bystolic .   6.  Bipolar 1 disorder/anxiety and depression -Stable. - Patient maintained on home regimen duloxetine .   7.  Chronic pain syndrome/lumbar radiculopathy  - Patient maintained on home regimen tizanidine  and duloxetine .   8.  Class II obesity -BMI 35.32 kg/m. - Lifestyle modification. - Outpatient follow-up with PCP.  Procedures: MRI brain 12/21/2023 MRA head 12/21/2023 2D echo 12/21/2023 MRV head 12/21/2023  Consultations: Neurology  Discharge Exam: Vitals:   12/21/23 2148 12/22/23 0536  BP: 112/70 115/70  Pulse: 74 60  Resp: 19 18  Temp: 99.4 F (37.4 C) 99.5 F (37.5 C)  SpO2: 97% 96%    General: NAD Cardiovascular: RRR no murmurs rubs or gallops.  No JVD.  No lower extremity edema. Respiratory: Lungs clear to auscultation bilaterally.  No wheezes, no crackles, no rhonchi.  Fair air movement.  Speaking in full sentences.  Discharge Instructions   Discharge Instructions     Ambulatory referral to Neurology   Complete by: As directed    An appointment is requested in approximately: 2 weeks   Diet Carb Modified   Complete by: As directed    Increase activity slowly   Complete by: As  directed       Allergies as of 12/22/2023       Reactions   Clindamycin/lincomycin Rash   Diffuse drug reaction eruption   Lamictal [lamotrigine] Rash   Banana Diarrhea   Lipitor [atorvastatin ] Other (See Comments)   Severe fatigue   Pineapple Diarrhea   Watermelon Concentrate [citrullus Vulgaris] Rash        Medication List     STOP taking these medications    oxyCODONE  5 MG immediate release tablet Commonly known as: Roxicodone        TAKE these medications    acetaminophen  500 MG tablet Commonly known as: TYLENOL  Take 1,000 mg by mouth every 6 (six) hours as needed for mild pain (pain score 1-3).   apixaban  5 MG Tabs tablet Commonly known as: Eliquis  Take 1 tablet (5 mg total) by mouth 2 (two) times daily.   Caplyta 21 MG Caps Generic drug: Lumateperone Tosylate Take 1 capsule by mouth at bedtime.   Dapagliflozin  Pro-metFORMIN  ER 11-998 MG Tb24 Commonly known as: Xigduo  XR TAKE 1 TABLET BY MOUTH EVERYDAY AT BEDTIME   diphenhydramine -acetaminophen  25-500 MG Tabs tablet Commonly known as: TYLENOL  PM Take 3 tablets by mouth at bedtime.   DULoxetine  60 MG capsule Commonly known as: CYMBALTA  Take 120 mg by mouth at bedtime.   flecainide  50 MG tablet Commonly known  as: TAMBOCOR  TAKE 1 TABLET BY MOUTH 2 TIMES DAILY.   insulin  glargine 100 UNIT/ML Solostar Pen Commonly known as: LANTUS  Inject 30 Units into the skin daily.   nebivolol  5 MG tablet Commonly known as: Bystolic  Take 1 tablet (5 mg total) by mouth at bedtime.   omeprazole  20 MG capsule Commonly known as: PRILOSEC  Take 20 mg by mouth daily as needed (indigestion / heart burn).   rosuvastatin  10 MG tablet Commonly known as: CRESTOR  TAKE 1 TABLET (10 MG TOTAL) BY MOUTH DAILY. What changed: when to take this   tiZANidine  4 MG tablet Commonly known as: ZANAFLEX  Take 4 mg by mouth at bedtime.   topiramate 25 MG tablet Commonly known as: TOPAMAX Take 1 tablet (25 mg total) by mouth at  bedtime.   traMADol  50 MG tablet Commonly known as: Ultram  Take 1 tablet (50 mg total) by mouth every 6 (six) hours as needed.   Vitamin D3 1000 units Caps Take 4,000 Units by mouth daily.   ziprasidone 40 MG capsule Commonly known as: GEODON Take 40 mg by mouth every evening.       Allergies  Allergen Reactions   Clindamycin/Lincomycin Rash    Diffuse drug reaction eruption   Lamictal [Lamotrigine] Rash   Banana Diarrhea   Lipitor [Atorvastatin ] Other (See Comments)    Severe fatigue   Pineapple Diarrhea   Watermelon Concentrate [Citrullus Vulgaris] Rash    Follow-up Information     [name], PA-C. Schedule an appointment as soon as possible for a visit in 2 week(s).   Specialty: Physician Assistant Contact information: [address] [place] [address] [phone]         [facility]. Schedule an appointment as soon as possible for a visit in 2 week(s).   Why: f/u on complex migraine Contact information: [address] [place], [address] [place]  [address] [phone]                 The results of significant diagnostics from this hospitalization (including imaging, microbiology, ancillary and laboratory) are listed below for reference.    Significant Diagnostic Studies: MR MRV HEAD W WO CONTRAST Result Date: 12/21/2023 EXAM: MRV BRAIN 12/21/2023 12:34:48 PM TECHNIQUE: Multiplanar multisequence MRV of the head was performed with and without the administration of 8 mL of gadobutrol  (GADAVIST ) 1 MMOL/ML injection. Multiplanar 2D and 3D reformatted images are provided for review. COMPARISON: None available. CLINICAL HISTORY: Headache, new onset (Age >= 51y). FINDINGS: The superior sagittal sinus, internal cerebral veins, vein of galen, straight sinus, transverse sinuses, sigmoid sinuses, and jugular bulbs are patent without evidence of thrombus or significant stenosis. The left transverse and sigmoid sinuses are  dominant with the right transverse sinus being hypoplastic. IMPRESSION: 1. Negative MRV of the head. Electronically signed by: Dasie Hamburg MD 12/21/2023 02:10 PM EST RP Workstation: [id number]   MR ANGIO HEAD WO CONTRAST Result Date: 12/21/2023 EXAM: MR Angiography Head with intravenous Contrast. 12/21/2023 12:34:48 PM TECHNIQUE: Magnetic resonance angiography images of the head with intravenous contrast. Multiplanar 2D and 3D reformatted images are provided for review. COMPARISON: None provided. CLINICAL HISTORY: Headache, new onset (Age >= 51y) FINDINGS: ANTERIOR CIRCULATION: The intracranial internal carotid arteries are widely patent. A 4 mm aneurysm projects medially from the anterior genu of the left ICA. The anterior cerebral arteries and middle cerebral arteries are patent without evidence of a proximal branch occlusion or significant proximal stenosis. POSTERIOR CIRCULATION: The included portions of the intracranial vertebral arteries  are widely patent to the basilar and codominant. Patent PICA and SCA origins are visualized bilaterally. The basilar artery is widely patent. There are small right and large left posterior communicating arteries with hypoplasia of the left P1 segment. Both posterior cerebral arteries are patent without evidence of a significant proximal stenosis. No aneurysm. IMPRESSION: 1. No large vessel occlusion or significant proximal intracranial arterial stenosis. 2. 4 mm left ICA aneurysm. Electronically signed by: Dasie Hamburg MD 12/21/2023 02:08 PM EST RP Workstation: [id number]   MR BRAIN WO CONTRAST Result Date: 12/21/2023 EXAM: MRI BRAIN WITHOUT CONTRAST 12/21/2023 12:34:48 PM TECHNIQUE: Multiplanar multisequence MRI of the head/brain was performed without the administration of intravenous contrast. COMPARISON: Head CT 12/20/2023 and MRI 12/07/2016. CLINICAL HISTORY: New onset headache in patient age 73 or older. FINDINGS: BRAIN AND VENTRICLES: There is no evidence of an  acute infarct, intracranial hemorrhage, mass, midline shift, hydrocephalus, or extra-axial fluid collection. Cerebral volume is normal. No significant white matter disease is seen for age. Major intracranial vascular flow voids are preserved. ORBITS: No acute abnormality. SINUSES AND MASTOIDS: Trace right mastoid fluid. Clear paranasal sinuses. BONES AND SOFT TISSUES: Normal marrow signal. No acute soft tissue abnormality. IMPRESSION: 1. Unremarkable appearance of the brain for age. Electronically signed by: Dasie Hamburg MD 12/21/2023 02:01 PM EST RP Workstation: [id number]   ECHOCARDIOGRAM COMPLETE Result Date: 12/21/2023    ECHOCARDIOGRAM REPORT   Patient Name:   EBELIN DILLEHAY Date of Exam: 12/21/2023 Medical Rec #:  [id number]        Height:       61.0 in Accession #:    [id number]       Weight:       186.9 lb Date of Birth:  [birth year]        BSA:          1.835 m Patient Age:    58 years         BP:           93/52 mmHg Patient Gender: F                HR:           64 bpm. Exam Location:  Inpatient Procedure: 2D Echo, Cardiac Doppler, Color Doppler and Saline Contrast Bubble            Study (Both Spectral and Color Flow Doppler were utilized during            procedure). Indications:    Stroke  History:        Patient has prior history of Echocardiogram examinations, most                 recent 04/28/2022. Abnormal ECG, Arrythmias:Atrial Fibrillation,                 Signs/Symptoms:Chest Pain; Risk Factors:Hypertension and                 Diabetes. Shock.  Sonographer:    Ellouise Mose RDCS Referring Phys: [id number] CLARETTA HERO [facility]  Sonographer Comments: Patient is obese. Image acquisition challenging due to patient body habitus. Sending delay. IMPRESSIONS  1. Left ventricular ejection fraction, by estimation, is 65 to 70%. The left ventricle has normal function. The left ventricle has no regional wall motion abnormalities. Left ventricular diastolic parameters are consistent with Grade I diastolic dysfunction  (impaired relaxation). Elevated left ventricular end-diastolic pressure.  2. Right ventricular systolic function is normal. The right ventricular size is normal. There is  normal pulmonary artery systolic pressure. The estimated right ventricular systolic pressure is 23.4 mmHg.  3. The mitral valve is normal in structure. Trivial mitral valve regurgitation. No evidence of mitral stenosis.  4. The aortic valve is tricuspid. Aortic valve regurgitation is not visualized. No aortic stenosis is present.  5. The inferior vena cava is normal in size with greater than 50% respiratory variability, suggesting right atrial pressure of 3 mmHg.  6. Agitated saline contrast bubble study was negative, with no evidence of any interatrial shunt. FINDINGS  Left Ventricle: Left ventricular ejection fraction, by estimation, is 65 to 70%. The left ventricle has normal function. The left ventricle has no regional wall motion abnormalities. The left ventricular internal cavity size was normal in size. There is  no left ventricular hypertrophy. Left ventricular diastolic parameters are consistent with Grade I diastolic dysfunction (impaired relaxation). Elevated left ventricular end-diastolic pressure. Right Ventricle: The right ventricular size is normal. No increase in right ventricular wall thickness. Right ventricular systolic function is normal. There is normal pulmonary artery systolic pressure. The tricuspid regurgitant velocity is 2.26 m/s, and  with an assumed right atrial pressure of 3 mmHg, the estimated right ventricular systolic pressure is 23.4 mmHg. Left Atrium: Left atrial size was normal in size. Right Atrium: Right atrial size was normal in size. Pericardium: There is no evidence of pericardial effusion. Presence of epicardial fat layer. Mitral Valve: The mitral valve is normal in structure. Trivial mitral valve regurgitation. No evidence of mitral valve stenosis. Tricuspid Valve: The tricuspid valve is normal in  structure. Tricuspid valve regurgitation is mild . No evidence of tricuspid stenosis. Aortic Valve: The aortic valve is tricuspid. Aortic valve regurgitation is not visualized. No aortic stenosis is present. Aortic valve mean gradient measures 6.0 mmHg. Aortic valve peak gradient measures 11.3 mmHg. Aortic valve area, by VTI measures 2.08  cm. Pulmonic Valve: The pulmonic valve was normal in structure. Pulmonic valve regurgitation is not visualized. No evidence of pulmonic stenosis. Aorta: The aortic root and ascending aorta are structurally normal, with no evidence of dilitation. Venous: The inferior vena cava is normal in size with greater than 50% respiratory variability, suggesting right atrial pressure of 3 mmHg. IAS/Shunts: No atrial level shunt detected by color flow Doppler. Agitated saline contrast was given intravenously to evaluate for intracardiac shunting. Agitated saline contrast bubble study was negative, with no evidence of any interatrial shunt.  LEFT VENTRICLE PLAX 2D LVIDd:         4.30 cm     Diastology LVIDs:         2.40 cm     LV e' medial:    5.77 cm/s LV PW:         1.00 cm     LV E/e' medial:  15.6 LV IVS:        1.00 cm     LV e' lateral:   8.49 cm/s LVOT diam:     2.00 cm     LV E/e' lateral: 10.6 LV SV:         73 LV SV Index:   40 LVOT Area:     3.14 cm LV IVRT:       102 msec  LV Volumes (MOD) LV vol d, MOD A2C: 53.9 ml LV vol d, MOD A4C: 63.6 ml LV vol s, MOD A2C: 14.1 ml LV vol s, MOD A4C: 16.5 ml LV SV MOD A2C:     39.8 ml LV SV MOD A4C:  63.6 ml LV SV MOD BP:      43.3 ml RIGHT VENTRICLE             IVC RV S prime:     10.60 cm/s  IVC diam: 1.40 cm TAPSE (M-mode): 2.2 cm                             PULMONARY VEINS                             Diastolic Velocity: 59.10 cm/s                             S/D Velocity:       0.70                             Systolic Velocity:  44.20 cm/s LEFT ATRIUM             Index        RIGHT ATRIUM           Index LA diam:        3.40 cm 1.85  cm/m   RA Area:     13.70 cm LA Vol (A2C):   33.5 ml 18.25 ml/m  RA Volume:   26.90 ml  14.66 ml/m LA Vol (A4C):   25.3 ml 13.78 ml/m LA Biplane Vol: 32.1 ml 17.49 ml/m  AORTIC VALVE AV Area (Vmax):    2.28 cm AV Area (Vmean):   2.26 cm AV Area (VTI):     2.08 cm AV Vmax:           168.00 cm/s AV Vmean:          107.000 cm/s AV VTI:            0.351 m AV Peak Grad:      11.3 mmHg AV Mean Grad:      6.0 mmHg LVOT Vmax:         122.00 cm/s LVOT Vmean:        77.100 cm/s LVOT VTI:          0.232 m LVOT/AV VTI ratio: 0.66  AORTA Ao Root diam: 2.60 cm Ao Asc diam:  3.20 cm MITRAL VALVE               TRICUSPID VALVE MV Area (PHT): 4.31 cm    TR Peak grad:   20.4 mmHg MV Decel Time: 176 msec    TR Vmax:        226.00 cm/s MV E velocity: 90.00 cm/s MV A velocity: 74.90 cm/s  SHUNTS MV E/A ratio:  1.20        Systemic VTI:  0.23 m                            Systemic Diam: 2.00 cm Annabella Scarce MD Electronically signed by Annabella Scarce MD Signature Date/Time: 12/21/2023/12:48:21 PM    Final    CT HEAD CODE STROKE WO CONTRAST (LKW 0-4.5h, LVO 0-24h) Result Date: 12/20/2023 EXAM: CT HEAD WITHOUT CONTRAST 12/20/2023 07:25:59 PM TECHNIQUE: CT of the head was performed without the administration of intravenous contrast. Automated exposure control, iterative reconstruction, and/or weight based adjustment of the mA/kV was utilized to reduce the radiation dose to  as low as reasonably achievable. COMPARISON: None available. CLINICAL HISTORY: Neuro deficit, acute, stroke suspected. FINDINGS: BRAIN AND VENTRICLES: No acute hemorrhage. No evidence of acute infarct. No hydrocephalus. No extra-axial collection. No mass effect or midline shift. ORBITS: No acute abnormality. SINUSES: No acute abnormality. SOFT TISSUES AND SKULL: No skull fracture. Findings discussed with Dr. Bari via telephone at 7:35 PM. IMPRESSION: 1. No acute intracranial abnormality. Electronically signed by: Gilmore Molt MD 12/20/2023 07:37 PM EST  RP Workstation: [id number]   EP STUDY Result Date: 12/02/2023 SURGEON:  Eulas FORBES Furbish, MD PREPROCEDURE DIAGNOSES: 1. Paroxysmal atrial fibrillation. POSTPROCEDURE DIAGNOSES: 1. Paroxysmal  atrial fibrillation. PROCEDURES: 1. Electrophysiologic study. 2. Coronary sinus pacing and recording. 3. Electroanatomic mapping of the left atrium 5. Intracardiac echocardiography. 6. Transseptal puncture of an intact septum. INTRODUCTION:  Berenis R Dobek is a 58 y.o. female with a history of paroxysmal atrial fibrillation who now presents for EP study and pulsed field energy ablation . DESCRIPTION OF PROCEDURE:  Informed written consent was obtained, and the patient was brought to the electrophysiology lab in a fasting state.  The patient was adequately sedated with intravenous medications as outlined in the anesthesia report.  The patient's left and right groins were prepped and draped in the usual sterile fashion by the EP lab staff.  Using a percutaneous Seldinger technique, 1 9-French hemostasis sheaths was placed in the right femoral vein, and one 9-French and one 8-French  hemostasis sheath were placed into the left common femoral vein. Direct ultrasound guidance was used for right and left femoral veins with normal vessel patency. Using ultrasound guidance, the Brockenbrough needle and wire were visualized entering the vessel.  Initial Measurements: The patient presented to the electrophysiology lab in sinus rhythm. her PR interval measured 179 msec with a QRS duration of 133 msec and a QT interval of 436 msec.  Intracardiac Echocardiography: An 8-French Biosense Webster AcuNav intracardiac echocardiography catheter was introduced through the left common femoral vein and advanced into the right atrium. Intracardiac echocardiography was performed of the left atrium, and a three-dimensional anatomical rendering of the left atrium was performed using CARTO sound technology. The patient was noted to have a  relatively normal sized left atrium. The interatrial septum appeared intact. All 4 pulmonary veins were visualized and noted to have separate ostia.  The pulmonary veins were normal in size.  The left atrial appendage was visualized and did not reveal thrombus.   There was no evidence of pulmonary vein stenosis. Transseptal Puncture: Intravenous heparin  was administered to achieve a goal ACT of 300-350 seconds. The right common femoral vein sheath was exchanged for Faradrive connect sheath and transseptal access was achieved in a standard fashion using a Bayless pigtail wire with intracardiac echocardiography.  Catheter Placement:  A 7-French Biosense Webster Decapolar coronary sinus catheter was introduced through the right common femoral vein and advanced into the coronary sinus for recording and pacing from this location. 3D Mapping and Ablation: A  Biosense Webster Octarray catheter was advanced into the left atrium through the Faradrive sheath. A 3 D electroanatomical map was obtained. This revealed persistent electrical isolation of all four pulmonary veins and the posterior wall of the left atrium.  We did not demonstrate myocardial capture pacing within the pulmonary veins, and there was no evidence of conduction out of the veins. Measurements Following Ablation: In sinus rhythm with RR interval was 1114 msec, with PR 182 msec, QRS 117 msec, and QT 465 msec.  Rapid atrial pacing was performed, which  revealed an AV Wenckebach cycle length of 410 msec.  The procedure was therefore considered completed.  All catheters were removed, and the sheaths were aspirated and flushed. The right femoral venous access was closed with a two perclose devices and the left femoral venous accesses with Mynx devices.  EBL<68ml.  Intracardiac echocardiogram revealed no pericardial effusion.  There were no early apparent complications. CONCLUSIONS: 1. Sinus rhythm upon presentation.  2.  Persistent isolation of the pulmonary veins  and the posterior wall of the left atrium 3. No inducible arrhythmias  4. No early apparent complications. Eulas FORBES Nancey ROLLA 8:56 AM 12/02/2023     Microbiology: No results found for this or any previous visit (from the past 240 hours).   Labs: Basic Metabolic Panel: Recent Labs  Lab 12/20/23 2039 12/21/23 0054 12/22/23 0411  NA 136 138 138  K 4.6 4.0 4.0  CL 103 102 106  CO2  --  24 24  GLUCOSE 173* 231* 148*  BUN 12 10 18   CREATININE 0.60 0.66 0.70  CALCIUM   --  10.1 9.2   Liver Function Tests: Recent Labs  Lab 12/21/23 0054  AST 24  ALT 18  ALKPHOS 121  BILITOT 0.5  PROT 7.2  ALBUMIN  4.0   No results for input(s): LIPASE, AMYLASE in the last 168 hours. No results for input(s): AMMONIA in the last 168 hours. CBC: Recent Labs  Lab 12/20/23 1945 12/20/23 2039 12/22/23 0411  WBC 7.4  --  7.8  NEUTROABS 4.8  --   --   HGB 13.2 11.9* 11.0*  HCT 43.8 35.0* 37.0  MCV 86.2  --  86.2  PLT 254  --  211   Cardiac Enzymes: No results for input(s): CKTOTAL, CKMB, CKMBINDEX, TROPONINI in the last 168 hours. BNP: BNP (last 3 results) No results for input(s): BNP in the last 8760 hours.  ProBNP (last 3 results) No results for input(s): PROBNP in the last 8760 hours.  CBG: Recent Labs  Lab 12/21/23 0815 12/21/23 1133 12/21/23 1620 12/21/23 2145 12/22/23 0737  GLUCAP 196* 153* 184* 154* 141*       Signed:  Toribio Hummer MD.  [facility] 12/22/2023, 9:13 AM

## 2023-12-30 ENCOUNTER — Encounter (HOSPITAL_COMMUNITY): Payer: Self-pay

## 2023-12-30 ENCOUNTER — Ambulatory Visit (HOSPITAL_COMMUNITY): Attending: Physician Assistant | Admitting: Physician Assistant

## 2024-01-04 ENCOUNTER — Emergency Department (HOSPITAL_COMMUNITY): Admission: EM | Admit: 2024-01-04 | Discharge: 2024-01-04 | Disposition: A | Discharge status: Home / Self Care

## 2024-01-04 ENCOUNTER — Other Ambulatory Visit: Payer: Self-pay

## 2024-01-04 ENCOUNTER — Encounter (HOSPITAL_COMMUNITY): Payer: Self-pay | Admitting: Emergency Medicine

## 2024-01-04 ENCOUNTER — Ambulatory Visit: Payer: Self-pay

## 2024-01-04 DIAGNOSIS — M542 Cervicalgia: Secondary | ICD-10-CM | POA: Diagnosis present

## 2024-01-04 DIAGNOSIS — M62838 Other muscle spasm: Secondary | ICD-10-CM | POA: Insufficient documentation

## 2024-01-04 DIAGNOSIS — Z7901 Long term (current) use of anticoagulants: Secondary | ICD-10-CM | POA: Insufficient documentation

## 2024-01-04 DIAGNOSIS — M5481 Occipital neuralgia: Secondary | ICD-10-CM | POA: Insufficient documentation

## 2024-01-04 DIAGNOSIS — Z794 Long term (current) use of insulin: Secondary | ICD-10-CM | POA: Insufficient documentation

## 2024-01-04 DIAGNOSIS — Z7984 Long term (current) use of oral hypoglycemic drugs: Secondary | ICD-10-CM | POA: Insufficient documentation

## 2024-01-04 DIAGNOSIS — Z79899 Other long term (current) drug therapy: Secondary | ICD-10-CM | POA: Diagnosis not present

## 2024-01-04 MED ORDER — METHOCARBAMOL 500 MG PO TABS
1000.0000 mg | ORAL_TABLET | Freq: Once | ORAL | Status: AC
  Administered 2024-01-04: 1000 mg via ORAL
  Filled 2024-01-04: qty 2

## 2024-01-04 MED ORDER — METHOCARBAMOL 500 MG PO TABS: 1000.0000 mg | ORAL_TABLET | Freq: Four times a day (QID) | ORAL | 0 refills | Status: AC | PRN

## 2024-01-04 MED ORDER — HYDROMORPHONE HCL 1 MG/ML IJ SOLN
1.0000 mg | Freq: Once | INTRAMUSCULAR | Status: DC
  Filled 2024-01-04: qty 1

## 2024-01-04 MED ORDER — LIDOCAINE 5 % EX PTCH
1.0000 | MEDICATED_PATCH | CUTANEOUS | Status: DC
  Administered 2024-01-04: 1 via TRANSDERMAL
  Filled 2024-01-04: qty 1

## 2024-01-04 MED ORDER — LIDOCAINE 5 % EX PTCH: 1.0000 | MEDICATED_PATCH | CUTANEOUS | 0 refills | Status: DC

## 2024-01-04 NOTE — Telephone Encounter (Signed)
 FYI Only or Action Required?: FYI only for provider: ED advised.  Patient was last seen in primary care on 11/23/2023 by Morgan Saddie FALCON, PA-C.  Called Nurse Triage reporting Numbness.  Symptoms began several weeks ago.  Interventions attempted: Nothing.  Symptoms are: gradually worsening.  Triage Disposition: Go to ED Now (or PCP Triage)  Patient/caregiver understands and will follow disposition?: Yes Reason for Disposition  Headache  Answer Assessment - Initial Assessment Questions Went to ED 12/20/23 for migraine, found aneurysm on MRI. Doctor did not disclose that information and patient found out from results on her mychart and was sent on her way with no plan of care, was referred to [facility] and was told she could be treated for migraine not the aneurysm. Denies changes in speech. Advised patient to ED, different ED than before. Patient stated she has someone to drive her and she will got to ED now  1. SYMPTOM: What is the main symptom you are concerned about? (e.g., weakness, numbness)     Pupils dilated, facial numbness left side, eyes feels really heavy, pain in base of skull on left side. States feels like someone hit her with a 2x4.   2. ONSET: When did this start? (e.g., minutes, hours, days; while sleeping)     2 weeks ago, worsening  3. LAST NORMAL: When was the last time you (the patient) were normal (no symptoms)?     3-4 weeks ago  4. PATTERN Does this come and go, or has it been constant since it started?  Is it present now?     Comes and goes  5. CARDIAC SYMPTOMS: Have you had any of the following symptoms: chest pain, difficulty breathing, palpitations?     Heart palpitations, but has afib  6. NEUROLOGIC SYMPTOMS: Have you had any of the following symptoms: headache, dizziness, vision loss, double vision, changes in speech, unsteady on your feet?     Was told had a brain aneurysm on 12/20/23   7. OTHER SYMPTOMS: Do you have any other  symptoms?     Nausea, denies vomiting.  Protocols used: Neurologic [facility]  Copied from CRM #[id number]. Topic: Clinical - Red Word Triage >> Jan 04, 2024 11:11 AM Rosaria BRAVO wrote: Red Word that prompted transfer to Nurse Triage: Numbness in face, eyes feel tired/droopy, pain. Pt had a brain aneurism.

## 2024-01-04 NOTE — ED Provider Triage Note (Addendum)
 Emergency Medicine Provider Triage Evaluation Note  Morgan [name] , a 58 y.o. female  was evaluated in triage.  Pt complains of headache with numbness and tingling to face.  Patient admitted to [facility] last week and diagnosed with complex migraines.  Patient denies any changes in symptoms.  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  Appropriate orders placed.  Morgan [name] was informed that the remainder of the evaluation will be completed by another provider, this initial triage assessment does not replace that evaluation, and the importance of remaining in the ED until their evaluation is complete.     Morgan Ileana SAILOR, PA-C 01/04/24 1331    Morgan Ileana SAILOR, PA-C 01/04/24 [id number]

## 2024-01-04 NOTE — ED Triage Notes (Signed)
 Pt states she was seen at [facility] 2 weeks ago for migraine and had MRI done. Pt concerned about MRI results showing 4 mm aneurysm. Pt reports continued headache and numbness/tingling to face.

## 2024-01-04 NOTE — Discharge Instructions (Addendum)
 Use your lidocaine  patches as needed and take your Robaxin  up to 4 times a day as needed.  You should pick up Excedrin Migraine, it is over-the-counter and use it as needed for pain.  You can also use Tylenol  as needed.  Follow-up with neurology in 2 weeks as scheduled.  Return to the ER for any new or worsening symptoms.  [facility] - [phone] [place] [address]  [facility] [phone] [address]

## 2024-01-04 NOTE — ED Triage Notes (Addendum)
 Pt c/o headache and numbness/tingling x4 weeks.  Pt was recently admitted to [facility] for same and diagnosed w/ migraines.  Denies new symptoms or neuro changes.

## 2024-01-04 NOTE — ED Provider Notes (Signed)
 [place] EMERGENCY DEPARTMENT AT [facility] Provider Note   CSN: [id number] Arrival date & time: 01/04/24  1251     Patient presents with: Headache and Numbness   Morgan [name] is a 58 y.o. female.   58 year old female presents for evaluation of neck pain and headache.  States has been going on for 2 weeks since she was discharged from [facility] after being admitted for complex migraine.  States her pain never got any better even with the medication in the hospital.  States she had an MRI done that showed a aneurysm and she is concerned that it is causing her symptoms.  She denies any numbness or tingling, denies any focal weakness, change in vision or any other symptoms or concerns.  Does states she has had some left sided neck pain and difficulty moving her neck.  Has taken tramadol  without relief.   Headache Associated symptoms: no abdominal pain, no back pain, no cough, no ear pain, no eye pain, no fever, no seizures, no sore throat and no vomiting        Prior to Admission medications   Medication Sig Start Date End Date Taking? Authorizing Provider  lidocaine  (LIDODERM ) 5 % Place 1 patch onto the skin daily. Remove & Discard patch within 12 hours or as directed by MD 01/04/24  Yes Bentlee Benningfield L, DO  methocarbamol  (ROBAXIN ) 500 MG tablet Take 2 tablets (1,000 mg total) by mouth every 6 (six) hours as needed for up to 7 days for muscle spasms. 01/04/24 01/11/24 Yes Yahia Bottger L, DO  acetaminophen  (TYLENOL ) 500 MG tablet Take 1,000 mg by mouth every 6 (six) hours as needed for mild pain (pain score 1-3).    [provider]  apixaban  (ELIQUIS ) 5 MG TABS tablet Take 1 tablet (5 mg total) by mouth 2 (two) times daily. 09/26/23   Mealor, Augustus E, MD  CAPLYTA 21 MG CAPS Take 1 capsule by mouth at bedtime. Patient not taking: Reported on 12/20/2023 11/21/23   [provider]  Cholecalciferol (VITAMIN D3) 1000 units CAPS Take 4,000  Units by mouth daily.    [provider]  Dapagliflozin  Pro-metFORMIN  ER (XIGDUO  XR) 11-998 MG TB24 TAKE 1 TABLET BY MOUTH EVERYDAY AT BEDTIME 11/17/23   Clapp, Kara F, PA-C  diphenhydramine -acetaminophen  (TYLENOL  PM) 25-500 MG TABS tablet Take 3 tablets by mouth at bedtime.    [provider]  DULoxetine  (CYMBALTA ) 60 MG capsule Take 120 mg by mouth at bedtime. 11/04/16   [provider]  flecainide  (TAMBOCOR ) 50 MG tablet TAKE 1 TABLET BY MOUTH 2 TIMES DAILY. 12/12/23   Fenton, Clint R, PA  insulin  glargine (LANTUS ) 100 UNIT/ML Solostar Pen Inject 30 Units into the skin daily. 09/12/23   [name], PA-C  nebivolol  (BYSTOLIC ) 5 MG tablet Take 1 tablet (5 mg total) by mouth at bedtime. 11/07/23   Claudene Toribio BROCKS, MD  omeprazole  (PRILOSEC ) 20 MG capsule Take 20 mg by mouth daily as needed (indigestion / heart burn).    [provider]  rosuvastatin  (CRESTOR ) 10 MG tablet TAKE 1 TABLET (10 MG TOTAL) BY MOUTH DAILY. Patient taking differently: Take 10 mg by mouth at bedtime. 07/15/23   Mealor, Augustus E, MD  tiZANidine  (ZANAFLEX ) 4 MG tablet Take 4 mg by mouth at bedtime. 05/14/21   [provider]  topiramate (TOPAMAX) 25 MG tablet Take 1 tablet (25 mg total) by mouth at bedtime. 12/22/23   Sebastian Toribio GAILS, MD  traMADol  (  ULTRAM ) 50 MG tablet Take 1 tablet (50 mg total) by mouth every 6 (six) hours as needed. 12/22/23 12/21/24  Sebastian Toribio GAILS, MD  ziprasidone (GEODON) 40 MG capsule Take 40 mg by mouth every evening. 12/16/23   [provider]    Allergies: Clindamycin/lincomycin, Lamictal [lamotrigine], Banana, Lipitor [atorvastatin ], Pineapple, and Watermelon concentrate [citrullus vulgaris]    Review of Systems  Constitutional:  Negative for chills and fever.  HENT:  Negative for ear pain and sore throat.   Eyes:  Negative for pain and visual disturbance.  Respiratory:  Negative for cough and shortness of breath.   Cardiovascular:   Negative for chest pain and palpitations.  Gastrointestinal:  Negative for abdominal pain and vomiting.  Genitourinary:  Negative for dysuria and hematuria.  Musculoskeletal:  Negative for arthralgias and back pain.  Skin:  Negative for color change and rash.  Neurological:  Positive for headaches. Negative for seizures and syncope.  All other systems reviewed and are negative.   Updated Vital Signs BP 124/64 (BP Location: Left Arm)   Pulse 66   Temp 98.2 F (36.8 C)   Resp 15   Ht 5' 1 (1.549 m)   Wt 85 kg   LMP 05/12/2011   SpO2 100%   BMI 35.41 kg/m   Physical Exam Vitals and nursing note reviewed.  Constitutional:      General: She is not in acute distress.    Appearance: She is well-developed.  HENT:     Head: Normocephalic and atraumatic.     Comments: There is tenderness to palpation of the posterior occipital region on the left as well as left cervical spine musculature with some hypertonicity as well.  Eyes:     Conjunctiva/sclera: Conjunctivae normal.  Cardiovascular:     Rate and Rhythm: Normal rate and regular rhythm.     Heart sounds: No murmur heard. Pulmonary:     Effort: Pulmonary effort is normal. No respiratory distress.     Breath sounds: Normal breath sounds.  Abdominal:     Palpations: Abdomen is soft.     Tenderness: There is no abdominal tenderness.  Musculoskeletal:        General: No swelling.     Cervical back: Neck supple.  Skin:    General: Skin is warm and dry.     Capillary Refill: Capillary refill takes less than 2 seconds.  Neurological:     Mental Status: She is alert and oriented to person, place, and time. Mental status is at baseline.     GCS: GCS eye subscore is 4. GCS verbal subscore is 5. GCS motor subscore is 6.     Cranial Nerves: No cranial nerve deficit or facial asymmetry.     Sensory: No sensory deficit.     Motor: No weakness.  Psychiatric:        Mood and Affect: Mood normal.     (all labs ordered are listed,  but only abnormal results are displayed) Labs Reviewed - No data to display  EKG: None  Radiology: No results found.   Procedures   Medications Ordered in the ED  methocarbamol  (ROBAXIN ) tablet 1,000 mg (has no administration in time range)  HYDROmorphone  (DILAUDID ) injection 1 mg (has no administration in time range)  lidocaine  (LIDODERM ) 5 % 1 patch (has no administration in time range)  Medical Decision Making Patient here for neck pain and headache that has been ongoing without much change in nature despite tramadol  use.  Now it is from her ICA aneurysm that was found on her MRI as this is very small.  She has scheduled follow-up with neurology already.  I think she may have a muscle spasm of her neck and some element of occipital neuralgia as cervical spine musculature is fairly tender and hypertonic.  Will give her Dilaudid  here as well as Robaxin  and a lidocaine  patch.  Will give her prescription for Robaxin  and lidocaine  and she has vies Tylenol  or Excedrin Migraine as needed.  She is on a blood thinner.  Advised to return for new or worsening symptoms otherwise continue follow-up with primary care and neurology.  She feels comfortable to plan to be discharged home.  Problems Addressed: Muscle spasms of neck: acute illness or injury Occipital neuralgia of left side: acute illness or injury  Amount and/or Complexity of Data Reviewed External Data Reviewed: notes.    Details: Prior ED and hospital records reviewed-patient was seen by neurology, who recommended outpatient follow-up  Risk OTC drugs. Prescription drug management. Parenteral controlled substances.     Final diagnoses:  Muscle spasms of neck  Occipital neuralgia of left side    ED Discharge Orders          Ordered    lidocaine  (LIDODERM ) 5 %  Every 24 hours        01/04/24 1850    methocarbamol  (ROBAXIN ) 500 MG tablet  Every 6 hours PRN        01/04/24 1850                Chena Chohan L, DO 01/04/24 1854

## 2024-01-06 ENCOUNTER — Telehealth: Payer: Self-pay | Admitting: *Deleted

## 2024-01-06 NOTE — Telephone Encounter (Signed)
 Copied from CRM [id number]. Topic: Referral - Request for Referral >> Jan 06, 2024 10:31 AM Wyona SQUIBB wrote: Did the patient discuss referral with their provider in the last year? Yes (If No - schedule appointment) (If Yes - send message)  Appointment offered? No  Type of order/referral and detailed reason for visit: neurologist   Preference of office, provider, location: [facility] neurolog[facility]   If referral order, have you been seen by this specialty before? No (If Yes, this issue or another issue? When? Where?  Can we respond through MyChart? Yes

## 2024-01-13 ENCOUNTER — Encounter (HOSPITAL_COMMUNITY): Payer: Self-pay

## 2024-01-13 ENCOUNTER — Emergency Department (HOSPITAL_COMMUNITY)

## 2024-01-13 ENCOUNTER — Emergency Department (HOSPITAL_COMMUNITY)
Admission: EM | Admit: 2024-01-13 | Discharge: 2024-01-13 | Discharge status: Against Medical Advice | Attending: Emergency Medicine | Admitting: Emergency Medicine

## 2024-01-13 DIAGNOSIS — R110 Nausea: Secondary | ICD-10-CM | POA: Diagnosis not present

## 2024-01-13 DIAGNOSIS — R519 Headache, unspecified: Secondary | ICD-10-CM | POA: Diagnosis present

## 2024-01-13 DIAGNOSIS — Z5321 Procedure and treatment not carried out due to patient leaving prior to being seen by health care provider: Secondary | ICD-10-CM | POA: Insufficient documentation

## 2024-01-13 LAB — CBC WITH DIFFERENTIAL/PLATELET
Abs Immature Granulocytes: 0.03 K/uL (ref 0.00–0.07)
Basophils Absolute: 0.1 K/uL (ref 0.0–0.1)
Basophils Relative: 1 %
Eosinophils Absolute: 0.2 K/uL (ref 0.0–0.5)
Eosinophils Relative: 4 %
HCT: 44.6 % (ref 36.0–46.0)
Hemoglobin: 14 g/dL (ref 12.0–15.0)
Immature Granulocytes: 1 %
Lymphocytes Relative: 30 %
Lymphs Abs: 2 K/uL (ref 0.7–4.0)
MCH: 26.3 pg (ref 26.0–34.0)
MCHC: 31.4 g/dL (ref 30.0–36.0)
MCV: 83.7 fL (ref 80.0–100.0)
Monocytes Absolute: 0.3 K/uL (ref 0.1–1.0)
Monocytes Relative: 4 %
Neutro Abs: 3.9 K/uL (ref 1.7–7.7)
Neutrophils Relative %: 60 %
Platelets: 268 K/uL (ref 150–400)
RBC: 5.33 MIL/uL — ABNORMAL HIGH (ref 3.87–5.11)
RDW: 14 % (ref 11.5–15.5)
WBC: 6.5 K/uL (ref 4.0–10.5)
nRBC: 0 % (ref 0.0–0.2)

## 2024-01-13 LAB — BASIC METABOLIC PANEL WITH GFR
Anion gap: 10 (ref 5–15)
BUN: 6 mg/dL (ref 6–20)
CO2: 26 mmol/L (ref 22–32)
Calcium: 9 mg/dL (ref 8.9–10.3)
Chloride: 102 mmol/L (ref 98–111)
Creatinine, Ser: 0.61 mg/dL (ref 0.44–1.00)
GFR, Estimated: >60 mL/min (ref >60)
Glucose, Bld: 159 mg/dL — ABNORMAL HIGH (ref 70–99)
Potassium: 3.7 mmol/L (ref 3.5–5.1)
Sodium: 138 mmol/L (ref 135–145)

## 2024-01-13 LAB — I-STAT CHEM 8, ED
BUN: 6 mg/dL (ref 6–20)
Calcium, Ion: 1.17 mmol/L (ref 1.15–1.40)
Chloride: 102 mmol/L (ref 98–111)
Creatinine, Ser: 0.5 mg/dL (ref 0.44–1.00)
Glucose, Bld: 159 mg/dL — ABNORMAL HIGH (ref 70–99)
HCT: 41 % (ref 36.0–46.0)
Hemoglobin: 13.9 g/dL (ref 12.0–15.0)
Potassium: 3.7 mmol/L (ref 3.5–5.1)
Sodium: 140 mmol/L (ref 135–145)
TCO2: 25 mmol/L (ref 22–32)

## 2024-01-13 MED ORDER — SODIUM CHLORIDE 0.9 % IV BOLUS
1000.0000 mL | Freq: Once | INTRAVENOUS | Status: AC
Start: 2024-01-13 — End: 2024-01-13
  Administered 2024-01-13: 1000 mL via INTRAVENOUS

## 2024-01-13 MED ORDER — IOHEXOL 350 MG/ML SOLN
75.0000 mL | Freq: Once | INTRAVENOUS | Status: AC | PRN
  Administered 2024-01-13: 75 mL via INTRAVENOUS

## 2024-01-13 MED ORDER — LIDOCAINE 5 % EX PTCH
1.0000 | MEDICATED_PATCH | Freq: Once | CUTANEOUS | Status: DC
  Administered 2024-01-13: 1 via TRANSDERMAL
  Filled 2024-01-13: qty 1

## 2024-01-13 MED ORDER — METOCLOPRAMIDE HCL 5 MG/ML IJ SOLN
5.0000 mg | Freq: Once | INTRAMUSCULAR | Status: AC
  Administered 2024-01-13: 5 mg via INTRAVENOUS
  Filled 2024-01-13: qty 2

## 2024-01-13 MED ORDER — DEXAMETHASONE 4 MG PO TABS
8.0000 mg | ORAL_TABLET | Freq: Once | ORAL | Status: AC
  Administered 2024-01-13: 8 mg via ORAL
  Filled 2024-01-13: qty 2

## 2024-01-13 MED ORDER — DIPHENHYDRAMINE HCL 50 MG/ML IJ SOLN
25.0000 mg | Freq: Once | INTRAMUSCULAR | Status: AC
  Administered 2024-01-13: 25 mg via INTRAVENOUS
  Filled 2024-01-13: qty 1

## 2024-01-13 NOTE — ED Triage Notes (Addendum)
 Per [facility], Pt, from home, c/o headache and nausea x6 weeks.  Pain score 10/10.  Pt has been seen several times recently for same.  Hx of migraines.    Pt reports taking Robaxin  q 6hrs and has not been able to follow-up w/ Neuro.    Negative stroke screen .

## 2024-01-13 NOTE — ED Notes (Signed)
 ED Provider at bedside.

## 2024-01-13 NOTE — ED Notes (Signed)
 Pt. Stated, can you take this IV out, I do not want to wait anymore. Tech removed IV. Pt. Moved OTF

## 2024-01-13 NOTE — ED Provider Triage Note (Addendum)
 Emergency Medicine Provider Triage Evaluation Note  Morgan [name] , a 58 y.o. female  was evaluated in triage.  Pt complains of severe headache.  Patient reports morning she had a severe, sudden onset headache, pressure, stabbing sensation.  Top of her head.  Nausea no vomiting.  No photophobia or phonophobia.  No head trauma.  No fevers.  No vision or hearing changes.  No gait disturbance, no weakness.  She describes the headache as worst headache of her life.  Not relieved with home medications  Review of Systems  Positive: Headache Negative: Fever  Physical Exam  BP 132/79 (BP Location: Right Arm)   Pulse 76   Temp 98.2 F (36.8 C)   Resp 18   Ht 5' 1 (1.549 m)   Wt 84.8 kg   LMP 05/12/2011   SpO2 97%   BMI 35.33 kg/m  Gen:   Awake, no distress   Resp:  Normal effort  MSK:   Moves extremities without difficulty  Other:  Neuro intact  Medical Decision Making  Medically screening exam initiated at 12:30 PM.  Appropriate orders placed.  Kierston R Reamer was informed that the remainder of the evaluation will be completed by another provider, this initial triage assessment does not replace that evaluation, and the importance of remaining in the ED until their evaluation is complete.  Sudden onset HA described as worst of life, will get CT. Labs/headache cocktail [facility] rule does not apply due to age but she has no LOC, not exertional, no neck abnormalities    Elnor Jayson LABOR, DO 01/13/24 1231    Elnor Jayson LABOR, DO 01/13/24 1232

## 2024-01-16 ENCOUNTER — Encounter: Payer: Self-pay | Admitting: Neurology

## 2024-01-23 NOTE — Progress Notes (Unsigned)
 NEUROLOGY CONSULTATION NOTE  Morgan [name] MRN: [id number] DOB: 1966-01-21  Referring provider: Toribio Hummer, MD Primary care provider: Saddie Sacks, PA-C  Reason for consult:  migraines  Assessment/Plan:   Migraine with aura, with status migrainosus, not intractable Left cavernous ICA aneurysm, incidental finding  Migraine prevention:  Increase topiramate  to 50mg  at bedtime.  Increase to 100mg  at bedtime in 4 weeks if needed. Migraine rescue:  Will have her try samples of Nurtec.  Would not use triptans as she has stroke-like symptoms with these migraines.  Zofran  4mg  for nausea Lifestyle modification: Limit use of pain relievers to no more than 9 days out of the month to prevent risk of rebound or medication-overuse headache. Diet modification/hydration/caffeine  cessation Routine exercise Sleep hygiene Consider vitamins/supplements:  magnesium  citrate 400mg  daily, riboflavin 400mg  daily, CoQ10 100mg  three times daily Keep headache diary Refer to interventional radiology for evaluation and recommendations regarding aneurysm Follow up 6 months.    Subjective:  Morgan [name] is a 58 year old right-handed female with atrial fibrillation, Bipolar disorder, HTN, DM 2 and history of ovarian cancer who presents for migraines.  History supplemented by referring provider's note.  Discussed the use of AI scribe software for clinical note transcription with the patient, who gave verbal consent to proceed.  History of Present Illness Morgan [name] is a 58 year old female with a history of migraines who presents with persistent headaches and left-sided facial numbness.  In October 2025, she began experiencing severe headaches that started suddenly. These headaches are constant and occur daily, with pain localized to the left temple, the crown, and the occipital region of her head. The pain is described as stabbing and can reach a severity of ten out of ten, particularly once or  twice a week, lasting up to a day. Currently, the pain is rated at a six out of ten. Associated symptoms include nausea, occasional pressure behind her left eye, and heavy eyelids without fatigue. No vomiting, light sensitivity, or sound sensitivity.  Initially, she had left sided facial and arm numbness.  Now, she no longer has arm numbness but  experiences tingling and numbness on the left side of her face, sometimes involving the whole side but not the forehead. There is no involvement of the left leg or weakness in the arm. She sometimes experiences neck pain on the left side.  She was admitted to [facility] on 12/20/2023 when she developed left face and arm numbness.  CT head showed no acute intracranial abnormality..  She was considered for IV thrombolysis due to being on Eliquis  and mild deficits.  MRI and MRV of head were negative.  MRA of head revealed 4 mm left ICA aneurysm but otherwise unremarkable.  Treated for intractable migraine with migraine cocktail, Solu-Medrol , and Keppra  with some improvement.  Discharged on topiramate  but no improvement thus far.    She has been taking methocarbamol  and tizanidine , though recently she has only been using methocarbamol , which provides inconsistent relief. She also takes Tylenol  two to three times a week and avoids NSAIDs due to her use of Eliquis . She has not been taking tramadol  recently.  Due to ongoing headache with severe exacerbation, she was seen in the [facility] ED on 01/13/2024.  CTA head again demonstrated 4 x 4 mm left cavernous ICA aneurysm but no acute changes.  She has a past history of migraines, which were different in nature, occurring behind her eyes and associated with computer use. These migraines were  episodic and resolved after she stopped working in that environment. No recent illness or new medications correlating with the onset of her current headaches.  In terms of social history, she consumes two to three 16-ounce  bottles of caffeinated soft drinks daily, drinks minimal water, and does not smoke or drink alcohol. Her diet is varied, and she does not exercise regularly. She reports active depression but no anxiety. Her sleep has improved with methocarbamol , allowing for five to six hours of uninterrupted sleep per night, though she sometimes wakes up early and cannot return to sleep.  Family history reveals that her father experiences headaches, possibly related to eye strain, but there is no known family history of brain aneurysms.  Past NSAIDS/analgesics:  tramadol , Excedrin Migraine Past abortive triptans:  nonenone Past abortive ergotamine:  none Past muscle relaxants:  Flexeril  Past anti-emetic:  Reglan , Zofran  Past antihypertensive medications:  losartan , diltiazem  Past antidepressant medications:  amitriptyline Past anticonvulsant medications:  lamotrigine (rash) Past anti-CGRP:  none Past vitamins/Herbal/Supplements:  none Past antihistamines/decongestants:  none Other past therapies:  none  Current NSAIDS/analgesics:  tramadol  Current triptans:  none Current ergotamine:  none Current anti-emetic:  none Current muscle relaxants:  tizanidine  4mg  every 6 hours PRN, methocarbamol  500mg  every 6 hours PRN Current Antihypertensive medications:  nebivolol  Current Antidepressant medications:  Cymbalta  120mg  daily Current Anticonvulsant medications:  topiramate  25mg  at bedtime Current anti-CGRP:  none Current Vitamins/Herbal/Supplements:  none Current Antihistamines/Decongestants:  Tylenol  PM Other therapy:  none Other medications:  Geodon, Eliquis , Crestor    Imaging: 01/13/2024 CTA HEAD W WO:   1. 4 x 4 mm medially directed aneurysm along the anterior genu of the left cavernous ICA, possibly involving the origin of the superior hypophyseal artery. 2. No large vessel occlusion or high-grade stenosis of the intracranial arteries. 3. Fenestrated appearance of the left P1 segment. 12/21/2023 MRI  BRAIN WO:  Unremarkable appearance of the brain for age.  12/21/2023 MRA HEAD WO:  1. No large vessel occlusion or significant proximal intracranial arterial stenosis. 2. 4 mm left ICA aneurysm. 12/21/2023 MRV HEAD W WO:  Negative MRV of the head.  PAST MEDICAL HISTORY: Past Medical History:  Diagnosis Date   Anxiety    Atrial fibrillation (HCC)    Bipolar disorder (HCC)    Breast mass    right, removed and benign   Cancer (HCC) 09/16/2019   ovarian ca no radiation no chemo   Cervical dysplasia    Diabetes mellitus without complication (HCC)    Endometriosis    GERD (gastroesophageal reflux disease)    Hypercholesteremia    Hypertension    Migraine    Ovarian cancer (HCC)    Pelvic kidney    left   Postpartum depression    hx of    PAST SURGICAL HISTORY: Past Surgical History:  Procedure Laterality Date   ATRIAL FIBRILLATION ABLATION N/A 05/16/2023   Procedure: ATRIAL FIBRILLATION ABLATION;  Surgeon: Nancey Eulas BRAVO, MD;  Location: [facility];  Service: Cardiovascular;  Laterality: N/A;   ATRIAL FIBRILLATION ABLATION N/A 12/02/2023   Procedure: ATRIAL FIBRILLATION ABLATION;  Surgeon: Nancey Eulas BRAVO, MD;  Location: [facility];  Service: Cardiovascular;  Laterality: N/A;   BACK SURGERY  02/16/2012   BREAST BIOPSY Right 2016   BREAST BIOPSY Right 2020   BREAST EXCISIONAL BIOPSY Right 2021   BREAST EXCISIONAL BIOPSY Right 2016   benign   BREAST LUMPECTOMY WITH RADIOACTIVE SEED LOCALIZATION Right 10/28/2014   Procedure: RIGHT BREAST LUMPECTOMY WITH RADIOACTIVE SEED  LOCALIZATION;  Surgeon: Deward Null III, MD;  Location: [facility];  Service: General;  Laterality: Right;   BREAST LUMPECTOMY WITH RADIOACTIVE SEED LOCALIZATION Right 06/18/2019   Procedure: RIGHT BREAST RADIOACTIVE SEED LOCALIZATION LUMPECTOMY;  Surgeon: Null Deward MOULD, MD;  Location: [facility];  Service: General;  Laterality: Right;   BREAST SURGERY N/A     Phreesia 07/30/2019   CESAREAN SECTION  02/15/2001   COLONOSCOPY WITH PROPOFOL  N/A 05/01/2018   Procedure: COLONOSCOPY WITH PROPOFOL ;  Surgeon: Therisa Bi, MD;  Location: [facility];  Service: Gastroenterology;  Laterality: N/A;   COLPOSCOPY     ESOPHAGOGASTRODUODENOSCOPY (EGD) WITH PROPOFOL  N/A 05/01/2018   Procedure: ESOPHAGOGASTRODUODENOSCOPY (EGD) WITH PROPOFOL ;  Surgeon: Therisa Bi, MD;  Location: [facility];  Service: Gastroenterology;  Laterality: N/A;   ESOPHAGOGASTRODUODENOSCOPY (EGD) WITH PROPOFOL  N/A 11/13/2018   Procedure: ESOPHAGOGASTRODUODENOSCOPY (EGD) WITH PROPOFOL ;  Surgeon: Therisa Bi, MD;  Location: [facility];  Service: Gastroenterology;  Laterality: N/A;   GYNECOLOGIC CRYOSURGERY     NECK SURGERY  02/15/2010   PELVIC LAPAROSCOPY  02/15/1989   TOTAL ABDOMINAL HYSTERECTOMY N/A    Phreesia 07/30/2019    MEDICATIONS: Current Outpatient Medications on File Prior to Visit  Medication Sig Dispense Refill   acetaminophen  (TYLENOL ) 500 MG tablet Take 1,000 mg by mouth every 6 (six) hours as needed for mild pain (pain score 1-3).     apixaban  (ELIQUIS ) 5 MG TABS tablet Take 1 tablet (5 mg total) by mouth 2 (two) times daily. 60 tablet 5   CAPLYTA 21 MG CAPS Take 1 capsule by mouth at bedtime. (Patient not taking: Reported on 12/20/2023)     Cholecalciferol  (VITAMIN D3) 1000 units CAPS Take 4,000 Units by mouth daily.     Dapagliflozin  Pro-metFORMIN  ER (XIGDUO  XR) 11-998 MG TB24 TAKE 1 TABLET BY MOUTH EVERYDAY AT BEDTIME 90 tablet 1   diphenhydramine -acetaminophen  (TYLENOL  PM) 25-500 MG TABS tablet Take 3 tablets by mouth at bedtime.     DULoxetine  (CYMBALTA ) 60 MG capsule Take 120 mg by mouth at bedtime.  5   flecainide  (TAMBOCOR ) 50 MG tablet TAKE 1 TABLET BY MOUTH 2 TIMES DAILY. 60 tablet 0   insulin  glargine (LANTUS ) 100 UNIT/ML Solostar Pen Inject 30 Units into the skin daily. 15 mL 3   lidocaine  (LIDODERM ) 5 % Place 1 patch onto the skin daily. Remove &  Discard patch within 12 hours or as directed by MD 30 patch 0   nebivolol  (BYSTOLIC ) 5 MG tablet Take 1 tablet (5 mg total) by mouth at bedtime.     omeprazole  (PRILOSEC ) 20 MG capsule Take 20 mg by mouth daily as needed (indigestion / heart burn).     rosuvastatin  (CRESTOR ) 10 MG tablet TAKE 1 TABLET (10 MG TOTAL) BY MOUTH DAILY. (Patient taking differently: Take 10 mg by mouth at bedtime.) 90 tablet 2   tiZANidine  (ZANAFLEX ) 4 MG tablet Take 4 mg by mouth at bedtime.     topiramate  (TOPAMAX ) 25 MG tablet Take 1 tablet (25 mg total) by mouth at bedtime. 30 tablet 1   traMADol  (ULTRAM ) 50 MG tablet Take 1 tablet (50 mg total) by mouth every 6 (six) hours as needed. 20 tablet 0   ziprasidone (GEODON) 40 MG capsule Take 40 mg by mouth every evening.     No current facility-administered medications on file prior to visit.    ALLERGIES: Allergies  Allergen Reactions   Clindamycin/Lincomycin Rash    Diffuse drug reaction eruption   Lamictal [Lamotrigine] Rash  Banana Diarrhea   Lipitor [Atorvastatin ] Other (See Comments)    Severe fatigue   Pineapple Diarrhea   Watermelon Concentrate [Citrullus Vulgaris] Rash    FAMILY HISTORY: Family History  Problem Relation Age of Onset   Diabetes Mother    Breast cancer Mother 32   Thyroid  disease Mother    Depression Mother    Hypertension Father    Heart disease Father    Heart attack Father 36       Two instances, first at age 61   Stroke Father    Alcohol abuse Father    Throat cancer Father    Diabetes Maternal Grandmother    Depression Maternal Grandmother    Uterine cancer Maternal Grandmother     Objective:  Blood pressure 126/66, pulse 71, height 5' 1 (1.549 m), weight 185 lb (83.9 kg), last menstrual period 05/12/2011, SpO2 95%. General: No acute distress.  Patient appears well-groomed.   Head:  Normocephalic/atraumatic Eyes:  fundi examined but not visualized Neck: supple, left suboccipital tenderness but no paraspinal  tenderness, decreased range of motion with neck turn to left. Heart: regular rate and rhythm Neurological Exam: Mental status: alert and oriented to person, place, and time, speech fluent and not dysarthric, language intact. Cranial nerves: CN I: not tested CN II: pupils equal, round and reactive to light, visual fields intact CN III, IV, VI:  full range of motion, no nystagmus, no ptosis CN V: facial sensation intact. CN VII: upper and lower face symmetric CN VIII: hearing intact CN IX, X: gag intact, uvula midline CN XI: sternocleidomastoid and trapezius muscles intact CN XII: tongue midline Bulk & Tone: normal, no fasciculations. Motor:  muscle strength 5/5 throughout Sensation:  Pinprick and vibratory sensation intact. Deep Tendon Reflexes:  2+ throughout,  toes downgoing.   Finger to nose testing:  Without dysmetria.   Gait:  Normal station and stride.  Romberg negative.   Juliene Dunnings, DO  CC:  Saddie Sacks, PA-C

## 2024-01-24 ENCOUNTER — Encounter: Payer: Self-pay | Admitting: Neurology

## 2024-01-24 ENCOUNTER — Ambulatory Visit: Admitting: Neurology

## 2024-01-24 VITALS — BP 126/66 | HR 71 | Ht 61.0 in | Wt 185.0 lb

## 2024-01-24 DIAGNOSIS — G43101 Migraine with aura, not intractable, with status migrainosus: Secondary | ICD-10-CM

## 2024-01-24 DIAGNOSIS — I671 Cerebral aneurysm, nonruptured: Secondary | ICD-10-CM

## 2024-01-24 MED ORDER — TOPIRAMATE 50 MG PO TABS: 50.0000 mg | ORAL_TABLET | Freq: Every day | ORAL | 5 refills | Status: AC

## 2024-01-24 MED ORDER — ONDANSETRON 4 MG PO TBDP: 4.0000 mg | ORAL_TABLET | Freq: Three times a day (TID) | ORAL | 5 refills | Status: AC | PRN

## 2024-01-24 MED ORDER — METHOCARBAMOL 500 MG PO TABS: 500.0000 mg | ORAL_TABLET | Freq: Four times a day (QID) | ORAL | 5 refills | Status: AC | PRN

## 2024-01-24 NOTE — Patient Instructions (Addendum)
  Refer to interventional radiology upstairs (Dr. Lester) for evaluation of aneurysm Increase TOPIRAMATE  to 50mg  at bedtime.  Contact us  in 4 weeks with update and we can increase dose if needed. Take NURTEC at earliest onset of headache.  Maximum 1 tablet in 24 hours.  Let me know if it works ONDANSETRON  as needed for nausea Limit use of pain relievers to no more than 9 days out of the month.  These medications include acetaminophen , NSAIDs (ibuprofen /Advil /Motrin , naproxen/Aleve, triptans (Imitrex/sumatriptan), Excedrin, and narcotics.  This will help reduce risk of rebound headaches. Be aware of common food triggers:  - Caffeine :  coffee, black tea, cola, Mt. Dew  - Chocolate  - Dairy:  aged cheeses (brie, blue, cheddar, gouda, Parmasan, provolone, romano, Swiss, etc), chocolate milk, buttermilk, sour cream, limit eggs and yogurt  - Nuts, peanut butter  - Alcohol  - Cereals/grains:  FRESH breads (fresh bagels, sourdough, doughnuts), yeast productions  - Processed/canned/aged/cured meats (pre-packaged deli meats, hotdogs)  - MSG/glutamate:  soy sauce, flavor enhancer, pickled/preserved/marinated foods  - Sweeteners:  aspartame (Equal, Nutrasweet).  Sugar and Splenda are okay  - Vegetables:  legumes (lima beans, lentils, snow peas, fava beans, pinto peans, peas, garbanzo beans), sauerkraut, onions, olives, pickles  - Fruit:  avocados, bananas, citrus fruit (orange, lemon, grapefruit), mango  - Other:  Frozen meals, macaroni and cheese Routine exercise Stay adequately hydrated (aim for 64 oz water daily) Keep headache diary Maintain proper stress management Maintain proper sleep hygiene Do not skip meals Consider supplements:  magnesium  citrate 400mg  daily, riboflavin 400mg  daily, coenzyme Q10 100mg  three times daily.

## 2024-01-25 ENCOUNTER — Other Ambulatory Visit (HOSPITAL_COMMUNITY): Payer: Self-pay | Admitting: Physician Assistant

## 2024-02-13 ENCOUNTER — Other Ambulatory Visit: Payer: Self-pay | Admitting: Obstetrics & Gynecology

## 2024-02-13 DIAGNOSIS — Z1231 Encounter for screening mammogram for malignant neoplasm of breast: Secondary | ICD-10-CM

## 2024-02-14 ENCOUNTER — Encounter (HOSPITAL_BASED_OUTPATIENT_CLINIC_OR_DEPARTMENT_OTHER): Payer: Self-pay | Admitting: Cardiology

## 2024-02-14 DIAGNOSIS — G4733 Obstructive sleep apnea (adult) (pediatric): Secondary | ICD-10-CM

## 2024-02-15 NOTE — Procedures (Signed)
 "  SLEEP STUDY REPORT Patient Information Study Date: 02/14/2024 Patient Name: Morgan [name] Patient ID: [id number] Birth Date: [birth year] Age: 58 Gender: Female BMI: 35.4 (W=187 lb, H=5' 1'') Referring Physician: Eulas Furbish, MD  TEST DESCRIPTION: Home sleep apnea testing was completed using the WatchPat, a Type 1 device, utilizing  peripheral arterial tonometry (PAT), chest movement, actigraphy, pulse oximetry, pulse rate, body position and snore.  AHI was calculated with apnea and hypopnea using valid sleep time as the denominator. RDI includes apneas,  hypopneas, and RERAs. The data acquired and the scoring of sleep and all associated events were performed in  accordance with the recommended standards and specifications as outlined in the AASM Manual for the Scoring of  Sleep and Associated Events 2.2.0 (2015).   FINDINGS:   1. Mild Obstructive Sleep Apnea with AHI 5.3/hr.   2. No Central Sleep Apnea with pAHIc 0.6/hr.   3. Oxygen desaturations as low as 84%.   4. Moderate to severe snoring was present. O2 sats were < 88% for 0.7 min.   5. Total sleep time was 3 hrs and 25 min.   6. 9.7% of total sleep time was spent in REM sleep.   7. Normal sleep onset latency at 18 min.   8. Shortened REM sleep onset latency at 52 min.   9. Total awakenings were 9.  10. Arrhythmia detection: None  DIAGNOSIS: Very Mild Obstructive Sleep Apnea (G47.33) that only occurred during supine sleep but poor PAT tracing for most of the  study which may underestimate the degree of OSA.  RECOMMENDATIONS: 1. Clinical correlation of these findings is necessary. The decision to treat obstructive sleep apnea (OSA) is usually  based on the presence of apnea symptoms or the presence of associated medical conditions such as Hypertension,  Congestive Heart Failure, Atrial Fibrillation or Obesity. The most common symptoms of OSA are snoring, gasping for  breath while sleeping, daytime sleepiness and  fatigue.  2. Initiating apnea therapy is recommended given the presence of symptoms and/or associated conditions.  Recommend proceeding with one of the following:  a. Auto-CPAP therapy with a pressure range of 5-20cm H2O.  b. An oral appliance (OA) that can be obtained from certain dentists with expertise in sleep medicine. These are  primarily of use in non-obese patients with mild and moderate disease.  c. An ENT consultation which may be useful to look for specific causes of obstruction and possible treatment  options.  d. If patient is intolerant to PAP therapy, consider referral to ENT for evaluation for hypoglossal nerve stimulator.  3. Close follow-up is necessary to ensure success with CPAP or oral appliance therapy for maximum benefit . 4. A follow-up oximetry study on CPAP is recommended to assess the adequacy of therapy and determine the need  for supplemental oxygen or the potential need for Bi-level therapy. An arterial blood gas to determine the adequacy of  baseline ventilation and oxygenation should also be considered. 5. Healthy sleep recommendations include: adequate nightly sleep (normal 7-9 hrs/night), avoidance of caffeine  after  noon and alcohol near bedtime, and maintaining a sleep environment that is cool, dark and quiet. 6. Weight loss for overweight patients is recommended. Even modest amounts of weight loss can significantly  improve the severity of sleep apnea. 7. Snoring recommendations include: weight loss where appropriate, side sleeping, and avoidance of alcohol before  bed. 8. Operation of motor vehicle should be avoided when sleepy.  Signature: Wilbert Bihari, MD; [facility]; Diplomat, American Board of Sleep  Medicine Electronically Signed: 02/15/2024 10:33:00 AM  "

## 2024-02-20 ENCOUNTER — Encounter (HOSPITAL_COMMUNITY): Payer: Self-pay

## 2024-02-20 ENCOUNTER — Other Ambulatory Visit: Payer: Self-pay

## 2024-02-20 ENCOUNTER — Emergency Department (HOSPITAL_COMMUNITY)
Admission: EM | Admit: 2024-02-20 | Discharge: 2024-02-20 | Disposition: A | Discharge status: Home / Self Care | Attending: Emergency Medicine | Admitting: Emergency Medicine

## 2024-02-20 ENCOUNTER — Ambulatory Visit

## 2024-02-20 ENCOUNTER — Emergency Department (HOSPITAL_COMMUNITY)

## 2024-02-20 DIAGNOSIS — R079 Chest pain, unspecified: Secondary | ICD-10-CM | POA: Diagnosis present

## 2024-02-20 DIAGNOSIS — Z7901 Long term (current) use of anticoagulants: Secondary | ICD-10-CM | POA: Insufficient documentation

## 2024-02-20 DIAGNOSIS — K5900 Constipation, unspecified: Secondary | ICD-10-CM | POA: Diagnosis not present

## 2024-02-20 DIAGNOSIS — Z5321 Procedure and treatment not carried out due to patient leaving prior to being seen by health care provider: Secondary | ICD-10-CM | POA: Diagnosis not present

## 2024-02-20 DIAGNOSIS — R110 Nausea: Secondary | ICD-10-CM | POA: Insufficient documentation

## 2024-02-20 DIAGNOSIS — M79602 Pain in left arm: Secondary | ICD-10-CM | POA: Insufficient documentation

## 2024-02-20 LAB — I-STAT CHEM 8, ED
BUN: 4 mg/dL — ABNORMAL LOW (ref 6–20)
Calcium, Ion: 1.2 mmol/L (ref 1.15–1.40)
Chloride: 101 mmol/L (ref 98–111)
Creatinine, Ser: 0.6 mg/dL (ref 0.44–1.00)
Glucose, Bld: 122 mg/dL — ABNORMAL HIGH (ref 70–99)
HCT: 45 % (ref 36.0–46.0)
Hemoglobin: 15.3 g/dL — ABNORMAL HIGH (ref 12.0–15.0)
Potassium: 3.6 mmol/L (ref 3.5–5.1)
Sodium: 138 mmol/L (ref 135–145)
TCO2: 25 mmol/L (ref 22–32)

## 2024-02-20 LAB — CBC
HCT: 45 % (ref 36.0–46.0)
Hemoglobin: 14.2 g/dL (ref 12.0–15.0)
MCH: 26.6 pg (ref 26.0–34.0)
MCHC: 31.6 g/dL (ref 30.0–36.0)
MCV: 84.4 fL (ref 80.0–100.0)
Platelets: 261 K/uL (ref 150–400)
RBC: 5.33 MIL/uL — ABNORMAL HIGH (ref 3.87–5.11)
RDW: 14.2 % (ref 11.5–15.5)
WBC: 8.7 K/uL (ref 4.0–10.5)
nRBC: 0 % (ref 0.0–0.2)

## 2024-02-20 LAB — LIPASE, BLOOD: Lipase: 71 U/L — ABNORMAL HIGH (ref 11–51)

## 2024-02-20 LAB — TROPONIN T, HIGH SENSITIVITY: Troponin T High Sensitivity: <15 ng/L (ref 0–19)

## 2024-02-20 MED ORDER — ONDANSETRON HCL 4 MG/2ML IJ SOLN
4.0000 mg | Freq: Once | INTRAMUSCULAR | Status: AC
  Administered 2024-02-20: 4 mg via INTRAVENOUS
  Filled 2024-02-20: qty 2

## 2024-02-20 NOTE — ED Triage Notes (Signed)
 PER EMS: pt is from home with c/o chest pain and left arm pain, onset today at 11am associated with right sided abd pain that radiates to her back with nausea and constipation. LBM 5 days ago and she reports it was bloody. She has a GI appt tomorrow but states she couldn't wait. She takes Eliquis .  BP-170/110 HR-78 98% RA CBG-180

## 2024-02-20 NOTE — ED Provider Triage Note (Signed)
 Emergency Medicine Provider Triage Evaluation Note  Morgan [name] , a 59 y.o. female  was evaluated in triage.  Pt complains of stomach problems. Lost 13 lbs in 2-3 weeks. L sided stomach and back pain. +nausea, zofran  not helping. Reports that she went into Afib today and took her blood pressure and it was >200 systolic. Took her BP medicine. Also reports some blood in stool when she last had BM a few days ago but hasn't had BM or blood since that time. Now also reports L sided chest and arm pain. No falls. Last took zofran  at 9 am  Review of Systems  Positive: Abd pain, N/V, GIB, cough Negative: Fevers, SOB, falls, head trauma  Physical Exam  BP (!) 166/79 (BP Location: Right Arm)   Pulse 77   Temp 98.2 F (36.8 C) (Oral)   Resp 19   Ht 5' 1 (1.549 m)   Wt 83.9 kg   LMP 05/12/2011   SpO2 99%   BMI 34.96 kg/m  Gen:   Awake, no distress   Resp:  Normal effort  MSK:   Moves extremities without difficulty  Other:  +RUQ TTP, no epigastric or LLQ TTP on exam, no signs of peritonitis; no leg swelling  Medical Decision Making  Medically screening exam initiated at 4:34 PM.  Appropriate orders placed.  Morgan [name] was informed that the remainder of the evaluation will be completed by another provider, this initial triage assessment does not replace that evaluation, and the importance of remaining in the ED until their evaluation is complete.  Labs/imaging ordered. IV zofran  ordered. Patient is moderately hypertensive to 166/79. She will be moved to treatment space when one becomes available.   Morgan Sid SAILOR, MD 02/20/24 [phone]

## 2024-02-20 NOTE — ED Notes (Signed)
 Pt states she doesn't want to stay, asked for IV to be removed and left despite being encouraged to stay.

## 2024-02-22 ENCOUNTER — Other Ambulatory Visit (HOSPITAL_COMMUNITY): Payer: Self-pay | Admitting: Gastroenterology

## 2024-02-22 DIAGNOSIS — E1143 Type 2 diabetes mellitus with diabetic autonomic (poly)neuropathy: Secondary | ICD-10-CM

## 2024-02-23 ENCOUNTER — Ambulatory Visit

## 2024-03-01 ENCOUNTER — Ambulatory Visit: Admitting: Neuroradiology

## 2024-03-07 ENCOUNTER — Other Ambulatory Visit (HOSPITAL_COMMUNITY)

## 2024-03-12 ENCOUNTER — Ambulatory Visit

## 2024-03-13 ENCOUNTER — Ambulatory Visit: Payer: Self-pay

## 2024-03-13 NOTE — Telephone Encounter (Signed)
 FYI Only or Action Required?: Action required by provider: Requesting abx for UTI, denied OV.  Patient was last seen in primary care on 11/23/2023 by [name], PA-C.  Called Nurse Triage reporting Dysuria.  Symptoms began several days ago.  Interventions attempted: Rest, hydration, or home remedies.  Symptoms are: unchanged.  Triage Disposition: See HCP Within 4 Hours (Or PCP Triage)  Patient/caregiver understands and will follow disposition?: Yes   Reason for Disposition  Diabetes mellitus or weak immune system (e.g., HIV positive, cancer chemo, splenectomy, organ transplant, chronic steroids)  Answer Assessment - Initial Assessment Questions Pt denies OV, as she has no transportation to get to the office, she has had plenty of UTIs in the past and knows what this is and is requesting medication be sent to [facility] on file. Please advise.   1. SEVERITY: How bad is the pain?  (e.g., Scale 1-10; mild, moderate, or severe)     6-7/10  2. PATTERN: Is pain present every time you urinate or just sometimes?      Every time  3. ONSET: When did the painful urination start?      2 days ago  4. FEVER: Do you have a fever? If Yes, ask: What is your temperature, how was it measured, and when did it start?     Denies  5. PAST UTI: Have you had a urine infection before? If Yes, ask: When was the last time? and What happened that time?      A couple years, patient reports getting them very often when she was younger  75. CAUSE: What do you think is causing the painful urination?  (e.g., UTI, scratch, Herpes sore)     UTI  7. OTHER SYMPTOMS: Do you have any other symptoms? (e.g., blood in urine, flank pain, genital sores, urgency, vaginal discharge)     Urinary frequency, lower abd and pelvic discomfort, noticed pink-ish color when wiping.  Protocols used: Urination Pain - [facility]  Message from [facility] sent at 03/13/2024  2:45 PM EST  Pt has a UTI and  says she knows that's what it is and wants to know if something can be called. Started on Sunday.

## 2024-03-14 ENCOUNTER — Other Ambulatory Visit: Payer: Self-pay

## 2024-03-14 ENCOUNTER — Ambulatory Visit: Payer: Self-pay

## 2024-03-14 ENCOUNTER — Ambulatory Visit (INDEPENDENT_AMBULATORY_CARE_PROVIDER_SITE_OTHER)

## 2024-03-14 ENCOUNTER — Ambulatory Visit: Admitting: Cardiovascular Disease

## 2024-03-14 VITALS — BP 128/78 | HR 66 | Temp 98.5°F | Ht 61.0 in | Wt 188.0 lb

## 2024-03-14 DIAGNOSIS — R300 Dysuria: Secondary | ICD-10-CM | POA: Diagnosis not present

## 2024-03-14 LAB — POCT URINALYSIS DIP (CLINITEK)
Bilirubin, UA: NEGATIVE
Blood, UA: NEGATIVE
Glucose, UA: >=1000 mg/dL — AB
Ketones, POC UA: NEGATIVE mg/dL
Leukocytes, UA: NEGATIVE
Nitrite, UA: POSITIVE — AB
Spec Grav, UA: 1.015
Urobilinogen, UA: 1 U/dL
pH, UA: 5.5

## 2024-03-14 MED ORDER — NITROFURANTOIN MONOHYD MACRO 100 MG PO CAPS: 100.0000 mg | ORAL_CAPSULE | Freq: Two times a day (BID) | ORAL | 0 refills | Status: AC

## 2024-03-14 NOTE — Telephone Encounter (Signed)
 FYI Only or Action Required?: Action required by provider: lab or test result follow-up needed.  Patient was last seen in primary care on 03/14/2024 by [name], PA-C.  Called Nurse Triage reporting Results.  Symptoms began today.  Interventions attempted: Nothing.  Symptoms are: unchanged.  Triage Disposition: Call PCP Within 24 Hours  Patient/caregiver understands and will follow disposition?: No, wishes to speak with PCP   Reason for Disposition  [1] Caller requests to speak ONLY to PCP AND [2] NON-URGENT question  Answer Assessment - Initial Assessment Questions 1. REASON FOR CALL or QUESTION: What is your reason for calling today? or How can I best    Patient dropped off urine sample. Per chart review test is positive, Macrobid  sent, sample sent for culture-see OV notes from 03/13/24 by pcp. Patient states she has not been informed of positive result but reviewed her results in mychart and is concerned that her urine has glucose. She is wondering if she needs to be worried. She has not checked her blood glucose today. Plan to check blood glucose and she will call back if out of her normal range or if she develops symptoms of hyper/hypoglycemia. Patient will await communication back from pcp re: glucose in urine.  Protocols used: PCP Call - No Triage-A-AH Copied from CRM [id number]. Topic: Clinical - Lab/Test Results >> Mar 14, 2024  3:49 PM Rosaria BRAVO wrote: Reason for CRM: Very concerned about lab results

## 2024-03-14 NOTE — Telephone Encounter (Signed)
 Even if she knows she has a UTI, she still needs to come in to leave a urine sample so I can send it off for culture. It can be a nurse visit

## 2024-03-14 NOTE — Telephone Encounter (Signed)
 Contacted pt and informed her that if provider has any concerns with glucose in urine she would definitely reach out to her and that she should follow the plan as far as medicine until she hears something different from provider.

## 2024-03-14 NOTE — Telephone Encounter (Signed)
 Called and spoke with the patient. She's coming in this morning for Nurse Visit.

## 2024-03-14 NOTE — Progress Notes (Signed)
 Pt in for nurse visit to leave urine sample with dysuria symptoms x 1 day. UA indicative of UTI. Will start Macrobid  and send urine off for cx. Patient verbalized understanding and was in agreement with the plan.  Saddie JULIANNA Sacks, PA-C

## 2024-03-15 ENCOUNTER — Telehealth: Payer: Self-pay | Admitting: *Deleted

## 2024-03-15 DIAGNOSIS — I10 Essential (primary) hypertension: Secondary | ICD-10-CM

## 2024-03-15 DIAGNOSIS — R002 Palpitations: Secondary | ICD-10-CM

## 2024-03-15 DIAGNOSIS — G4733 Obstructive sleep apnea (adult) (pediatric): Secondary | ICD-10-CM

## 2024-03-15 NOTE — Telephone Encounter (Signed)
-----   Message from Wilbert Bihari, MD sent at 02/15/2024 10:34 AM EST ----- Very mild OSA but very poor PAT tracing which may underestimate degree of OSA -I would like her to have an in lab PSG to determine if treatment is needed

## 2024-03-15 NOTE — Telephone Encounter (Signed)
 The patient has been notified of the result and verbalized understanding.  All questions (if any) were answered. Joshua Dalton Seip, CMA 03/15/2024 11:59 AM     PRECERT PSG

## 2024-03-16 LAB — URINE CULTURE

## 2024-03-19 ENCOUNTER — Ambulatory Visit: Payer: Self-pay

## 2024-03-19 ENCOUNTER — Ambulatory Visit: Admitting: Emergency Medicine

## 2024-03-20 ENCOUNTER — Encounter (HOSPITAL_COMMUNITY)

## 2024-03-28 ENCOUNTER — Ambulatory Visit

## 2024-04-03 ENCOUNTER — Ambulatory Visit

## 2024-04-05 ENCOUNTER — Ambulatory Visit: Admitting: Pulmonary Disease

## 2024-04-10 ENCOUNTER — Encounter (HOSPITAL_COMMUNITY)

## 2024-04-12 ENCOUNTER — Ambulatory Visit: Admitting: Cardiology

## 2024-07-26 ENCOUNTER — Ambulatory Visit: Admitting: Neurology
